# Patient Record
Sex: Male | Born: 1940 | Race: White | Hispanic: No | Marital: Married | Smoking: Former smoker
Health system: Southern US, Community
[De-identification: ages and names within clinical notes are randomized; demographics above are authoritative.]

## PROBLEM LIST (undated history)

## (undated) DIAGNOSIS — I671 Cerebral aneurysm, nonruptured: Secondary | ICD-10-CM

## (undated) DIAGNOSIS — K922 Gastrointestinal hemorrhage, unspecified: Secondary | ICD-10-CM

## (undated) DIAGNOSIS — I10 Essential (primary) hypertension: Secondary | ICD-10-CM

## (undated) DIAGNOSIS — I2510 Atherosclerotic heart disease of native coronary artery without angina pectoris: Secondary | ICD-10-CM

## (undated) DIAGNOSIS — M1990 Unspecified osteoarthritis, unspecified site: Secondary | ICD-10-CM

## (undated) DIAGNOSIS — N189 Chronic kidney disease, unspecified: Secondary | ICD-10-CM

## (undated) DIAGNOSIS — K219 Gastro-esophageal reflux disease without esophagitis: Secondary | ICD-10-CM

## (undated) DIAGNOSIS — Z8679 Personal history of other diseases of the circulatory system: Secondary | ICD-10-CM

## (undated) DIAGNOSIS — K5792 Diverticulitis of intestine, part unspecified, without perforation or abscess without bleeding: Secondary | ICD-10-CM

## (undated) DIAGNOSIS — R413 Other amnesia: Secondary | ICD-10-CM

## (undated) DIAGNOSIS — D62 Acute posthemorrhagic anemia: Secondary | ICD-10-CM

## (undated) DIAGNOSIS — E785 Hyperlipidemia, unspecified: Secondary | ICD-10-CM

## (undated) DIAGNOSIS — Z86010 Personal history of colonic polyps: Secondary | ICD-10-CM

## (undated) DIAGNOSIS — Z87442 Personal history of urinary calculi: Secondary | ICD-10-CM

## (undated) DIAGNOSIS — I219 Acute myocardial infarction, unspecified: Secondary | ICD-10-CM

## (undated) DIAGNOSIS — H269 Unspecified cataract: Secondary | ICD-10-CM

## (undated) HISTORY — PX: INGUINAL HERNIA REPAIR: SUR1180

## (undated) HISTORY — DX: Acute myocardial infarction, unspecified: I21.9

## (undated) HISTORY — DX: Gastrointestinal hemorrhage, unspecified: K92.2

## (undated) HISTORY — DX: Acute posthemorrhagic anemia: D62

## (undated) HISTORY — PX: COLONOSCOPY: SHX174

## (undated) HISTORY — DX: Personal history of other diseases of the circulatory system: Z86.79

## (undated) HISTORY — DX: Hyperlipidemia, unspecified: E78.5

## (undated) HISTORY — DX: Personal history of colonic polyps: Z86.010

## (undated) HISTORY — DX: Diverticulitis of intestine, part unspecified, without perforation or abscess without bleeding: K57.92

## (undated) HISTORY — PX: OTHER SURGICAL HISTORY: SHX169

## (undated) HISTORY — DX: Chronic kidney disease, unspecified: N18.9

## (undated) HISTORY — PX: CATARACT EXTRACTION: SUR2

## (undated) HISTORY — DX: Unspecified osteoarthritis, unspecified site: M19.90

## (undated) HISTORY — DX: Gastro-esophageal reflux disease without esophagitis: K21.9

## (undated) HISTORY — DX: Unspecified cataract: H26.9

## (undated) HISTORY — DX: Essential (primary) hypertension: I10

## (undated) HISTORY — DX: Other amnesia: R41.3

---

## 1963-04-03 HISTORY — PX: UMBILICAL HERNIA REPAIR: SHX196

## 1996-04-02 DIAGNOSIS — Z8679 Personal history of other diseases of the circulatory system: Secondary | ICD-10-CM

## 1996-04-02 HISTORY — PX: ABDOMINAL AORTIC ANEURYSM REPAIR: SUR1152

## 1996-04-02 HISTORY — DX: Personal history of other diseases of the circulatory system: Z86.79

## 1999-04-24 ENCOUNTER — Encounter: Payer: Self-pay | Admitting: Family Medicine

## 1999-04-24 ENCOUNTER — Other Ambulatory Visit: Admission: RE | Admit: 1999-04-24 | Discharge: 1999-04-24 | Payer: Self-pay | Admitting: *Deleted

## 1999-04-24 ENCOUNTER — Encounter: Admission: RE | Admit: 1999-04-24 | Discharge: 1999-04-24 | Payer: Self-pay | Admitting: Family Medicine

## 1999-04-25 ENCOUNTER — Encounter: Payer: Self-pay | Admitting: *Deleted

## 1999-04-25 ENCOUNTER — Encounter: Admission: RE | Admit: 1999-04-25 | Discharge: 1999-04-25 | Payer: Self-pay | Admitting: *Deleted

## 1999-05-04 ENCOUNTER — Ambulatory Visit: Admission: RE | Admit: 1999-05-04 | Discharge: 1999-05-04 | Payer: Self-pay | Admitting: *Deleted

## 1999-05-09 ENCOUNTER — Inpatient Hospital Stay (HOSPITAL_COMMUNITY): Admission: RE | Admit: 1999-05-09 | Discharge: 1999-05-14 | Payer: Self-pay | Admitting: *Deleted

## 1999-05-09 ENCOUNTER — Encounter: Payer: Self-pay | Admitting: *Deleted

## 1999-05-09 ENCOUNTER — Encounter (INDEPENDENT_AMBULATORY_CARE_PROVIDER_SITE_OTHER): Payer: Self-pay | Admitting: *Deleted

## 1999-05-10 ENCOUNTER — Encounter: Payer: Self-pay | Admitting: *Deleted

## 2001-07-10 ENCOUNTER — Emergency Department (HOSPITAL_COMMUNITY): Admission: EM | Admit: 2001-07-10 | Discharge: 2001-07-10 | Payer: Self-pay | Admitting: Emergency Medicine

## 2007-07-28 ENCOUNTER — Encounter: Payer: Self-pay | Admitting: Family Medicine

## 2008-03-03 ENCOUNTER — Emergency Department (HOSPITAL_BASED_OUTPATIENT_CLINIC_OR_DEPARTMENT_OTHER): Admission: EM | Admit: 2008-03-03 | Discharge: 2008-03-03 | Payer: Self-pay | Admitting: Emergency Medicine

## 2008-03-03 ENCOUNTER — Ambulatory Visit: Payer: Self-pay | Admitting: Diagnostic Radiology

## 2008-03-03 IMAGING — CR DG KNEE COMPLETE 4+V*R*
4 series · 4 of 4 positions shown · non-contrast
Comparison: None

CLINICAL DATA: Right leg pain.

RIGHT KNEE - COMPLETE 4+ VIEW

[t knee ap right]
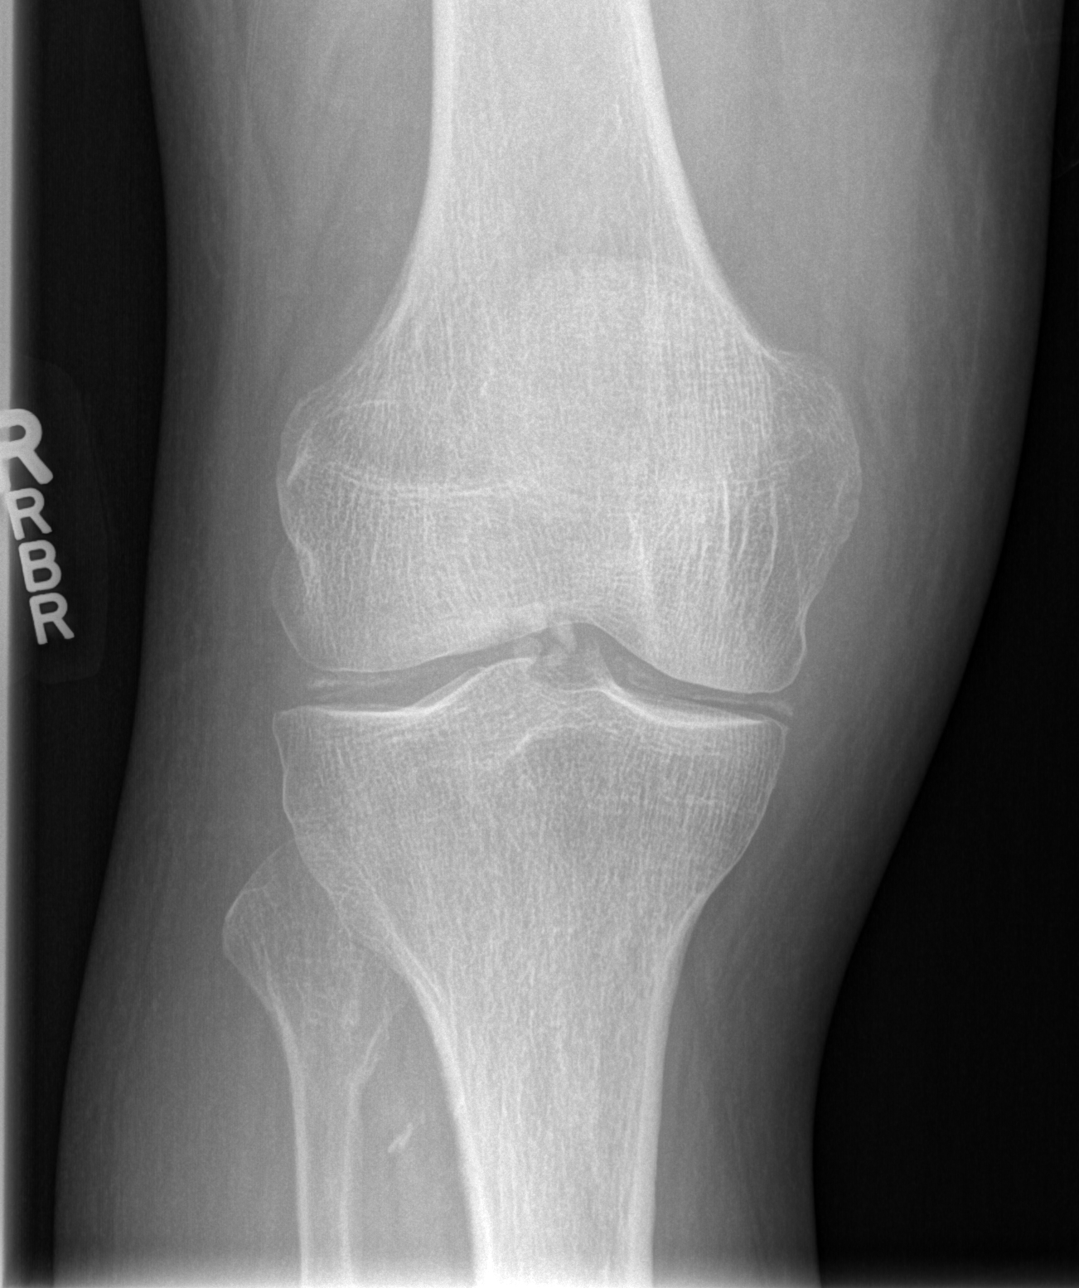

[t knee oblique right (1 of 2)]
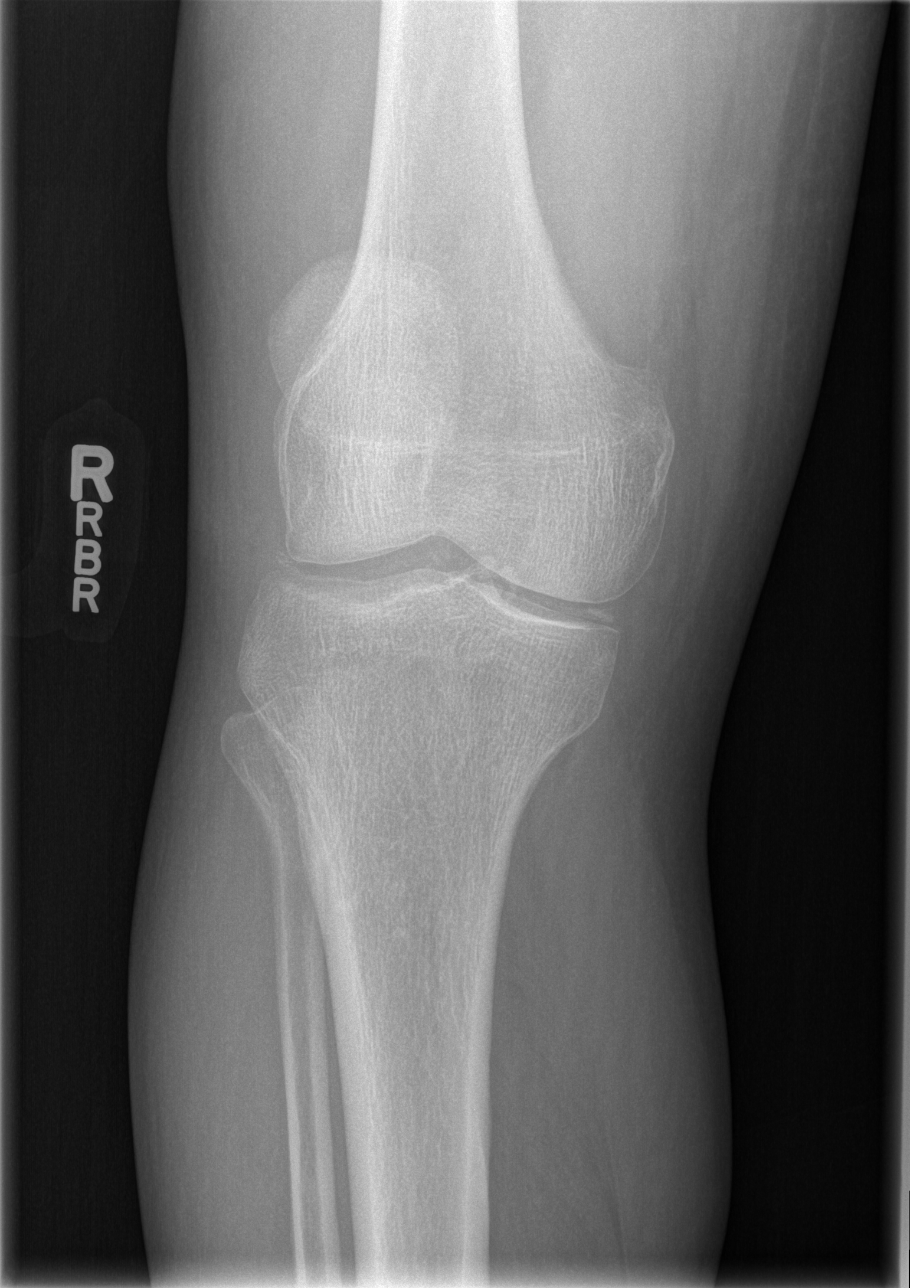

[t knee oblique right (2 of 2)]
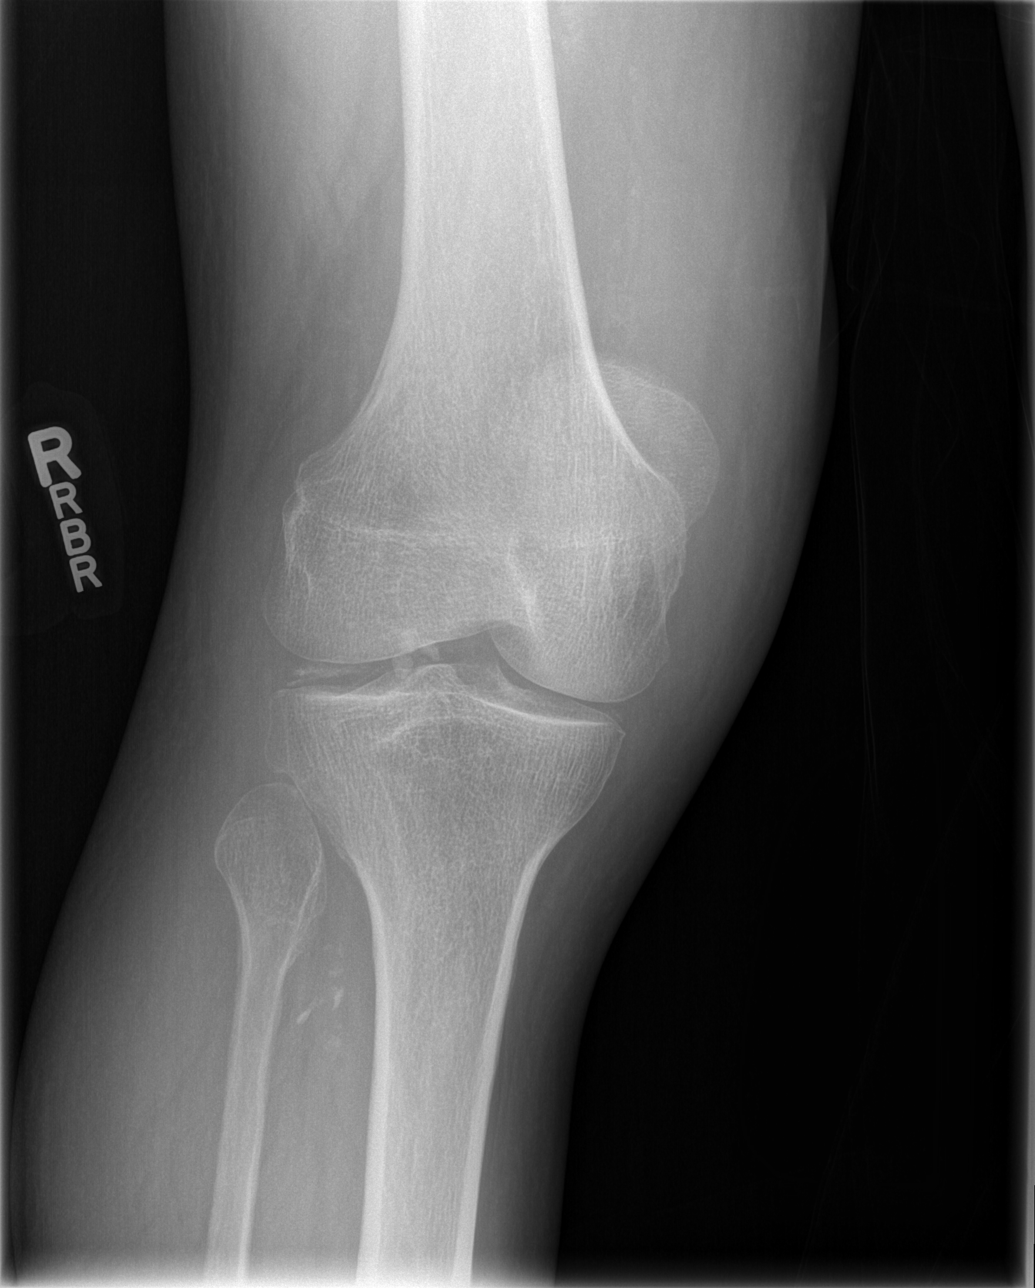

[t knee lat right]
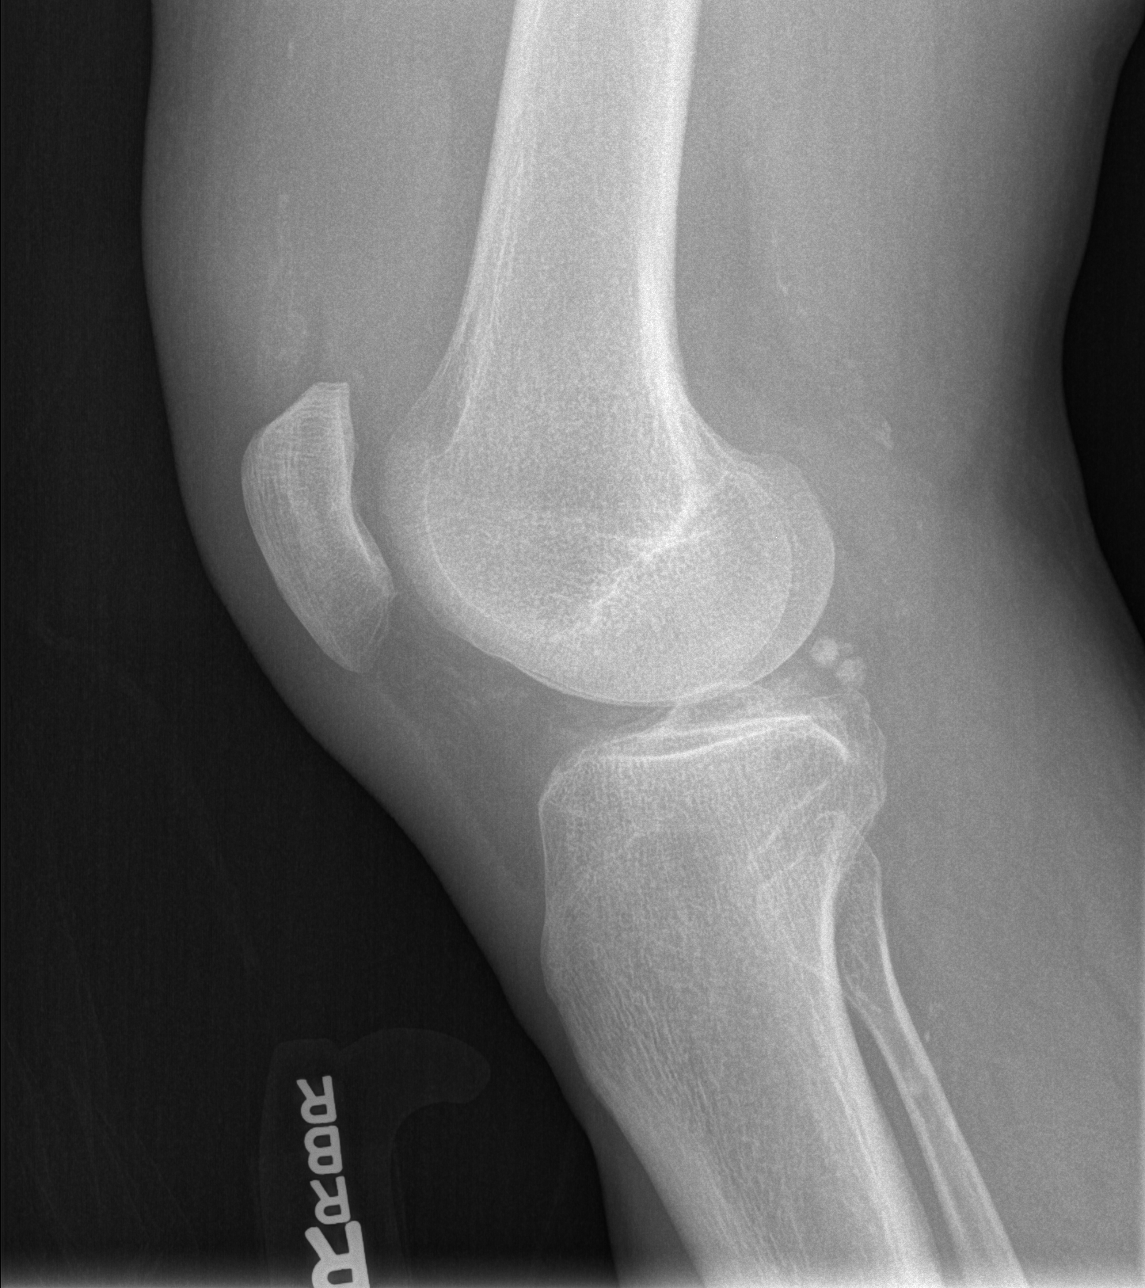

[4 of 4 positions shown; findings below may reference images not displayed]

FINDINGS: There is chondrocalcinosis with early degenerative
changes.  Moderate to large joint effusion present.  No fracture,
subluxation, or dislocation.
IMPRESSION: Chondrocalcinosis with early degenerative changes.

Moderate to large joint effusion.  No acute bony abnormality.

## 2008-10-15 ENCOUNTER — Ambulatory Visit: Payer: Self-pay | Admitting: Family Medicine

## 2008-10-15 DIAGNOSIS — E785 Hyperlipidemia, unspecified: Secondary | ICD-10-CM

## 2008-10-15 DIAGNOSIS — E1121 Type 2 diabetes mellitus with diabetic nephropathy: Secondary | ICD-10-CM

## 2008-10-15 DIAGNOSIS — I10 Essential (primary) hypertension: Secondary | ICD-10-CM

## 2009-04-14 ENCOUNTER — Ambulatory Visit: Payer: Self-pay | Admitting: Family Medicine

## 2009-04-14 LAB — CONVERTED CEMR LAB
ALT: 26 units/L (ref 0–53)
AST: 24 units/L (ref 0–37)
Albumin: 3.9 g/dL (ref 3.5–5.2)
Alkaline Phosphatase: 56 units/L (ref 39–117)
BUN: 18 mg/dL (ref 6–23)
Bilirubin, Direct: 0 mg/dL (ref 0.0–0.3)
CO2: 29 meq/L (ref 19–32)
Calcium: 9.6 mg/dL (ref 8.4–10.5)
Chloride: 97 meq/L (ref 96–112)
Cholesterol: 163 mg/dL (ref 0–200)
Creatinine, Ser: 1 mg/dL (ref 0.4–1.5)
GFR calc non Af Amer: 79.16 mL/min (ref >60)
Glucose, Bld: 147 mg/dL — ABNORMAL HIGH (ref 70–99)
HDL: 47.9 mg/dL (ref >39.00)
Hgb A1c MFr Bld: 6.8 % — ABNORMAL HIGH (ref 4.6–6.5)
LDL Cholesterol: 87 mg/dL (ref 0–99)
Potassium: 4.9 meq/L (ref 3.5–5.1)
Sodium: 134 meq/L — ABNORMAL LOW (ref 135–145)
Total Bilirubin: 1.1 mg/dL (ref 0.3–1.2)
Total CHOL/HDL Ratio: 3
Total Protein: 7.3 g/dL (ref 6.0–8.3)
Triglycerides: 143 mg/dL (ref 0.0–149.0)
VLDL: 28.6 mg/dL (ref 0.0–40.0)

## 2009-08-10 ENCOUNTER — Ambulatory Visit: Payer: Self-pay | Admitting: Family Medicine

## 2009-08-10 DIAGNOSIS — M549 Dorsalgia, unspecified: Secondary | ICD-10-CM | POA: Insufficient documentation

## 2009-11-21 ENCOUNTER — Ambulatory Visit: Payer: Self-pay | Admitting: Family Medicine

## 2009-11-22 LAB — CONVERTED CEMR LAB
Creatinine,U: 148.7 mg/dL
Hgb A1c MFr Bld: 6.9 % — ABNORMAL HIGH (ref 4.6–6.5)
Microalb Creat Ratio: 4.6 mg/g (ref 0.0–30.0)
Microalb, Ur: 6.8 mg/dL — ABNORMAL HIGH (ref 0.0–1.9)

## 2010-05-02 NOTE — Assessment & Plan Note (Signed)
Summary: DIABETES CK // RS   Vital Signs:  Patient profile:   69 year old male Weight:      197 pounds Temp:     98.1 degrees F oral BP sitting:   152 / 88  (left arm) Cuff size:   regular  Vitals Entered By: Sid Falcon LPN (April 14, 2009 9:23 AM) CC: 6 month follow-up, diabetes check, Hypertension Management   History of Present Illness: Followup multiple medical problems.  Type 2 diabetes. Fairly well-controlled on home readings. Fasting is usually 130 to146. No hypoglycemia. Schedule for eye exam in February of this year. No history of neuropathy or retinopathy. Medications reviewed. Patient compliant with Actos and metformin. No side effects from medication.  Hyperlipidemia treated with Vytorin. Patient compliant with therapy. No history of CAD. No myalgias or other side effects.  Hypertension on lisinopril. Questionable history of whitecoat syndrome. Blood pressures usually less a 130 systolic by home readings.  Diabetes Management History:      The patient is a 70 years old male who comes in for evaluation of DM Type 2.  He states understanding of dietary principles but he is not following the appropriate diet.  No sensory loss is reported.  Self foot exams are being performed.  He is checking home blood sugars.  He says that he is not exercising regularly.        Hypoglycemic symptoms are not occurring.  No hyperglycemic symptoms are reported.        There are no symptoms to suggest diabetic complications.  No changes have been made to his treatment plan since last visit.    Hypertension History:      He denies headache, chest pain, palpitations, dyspnea with exertion, orthopnea, PND, peripheral edema, visual symptoms, neurologic problems, syncope, and side effects from treatment.  He notes no problems with any antihypertensive medication side effects.        Positive major cardiovascular risk factors include male age 104 years old or older, diabetes, hyperlipidemia, and  hypertension.  Negative major cardiovascular risk factors include non-tobacco-user status.      Preventive Screening-Counseling & Management  Caffeine-Diet-Exercise     Does Patient Exercise: no  Allergies (verified): No Known Drug Allergies  Past History:  Past Medical History: Last updated: 10/15/2008  Diabetes mellitus, type II Hypertension Kidney Stones High Cholesterol   Past Surgical History: Last updated: 10/15/2008 Hernia 2 times Aneuysm -Aorta 2002   Social History: Last updated: 10/15/2008 Occupation: Never Smoked Alcohol use-no Married  Social History: Does Patient Exercise:  no  Review of Systems      See HPI  Physical Exam  General:  Well-developed,well-nourished,in no acute distress; alert,appropriate and cooperative throughout examination Ears:  External ear exam shows no significant lesions or deformities.  Otoscopic examination reveals clear canals, tympanic membranes are intact bilaterally without bulging, retraction, inflammation or discharge. Hearing is grossly normal bilaterally. Mouth:  Oral mucosa and oropharynx without lesions or exudates.  Teeth in good repair. Neck:  No deformities, masses, or tenderness noted. Lungs:  Normal respiratory effort, chest expands symmetrically. Lungs are clear to auscultation, no crackles or wheezes. Heart:  Normal rate and regular rhythm. S1 and S2 normal without gallop, murmur, click, rub or other extra sounds. Extremities:  No clubbing, cyanosis, edema, or deformity noted with normal full range of motion of all joints.   Neurologic:  normal sensory function to monofilament testing  Diabetes Management Exam:    Foot Exam (with socks and/or shoes not present):  Sensory-Pinprick/Light touch:          Left medial foot (L-4): normal          Left dorsal foot (L-5): normal          Left lateral foot (S-1): normal          Right medial foot (L-4): normal          Right dorsal foot (L-5): normal           Right lateral foot (S-1): normal       Sensory-Monofilament:          Left foot: normal          Right foot: normal       Inspection:          Left foot: normal          Right foot: normal       Nails:          Left foot: normal          Right foot: normal   Impression & Recommendations:  Problem # 1:  HYPERLIPIDEMIA (ICD-272.4)  His updated medication list for this problem includes:    Vytorin 10-40 Mg Tabs (Ezetimibe-simvastatin) ..... Once daily  Orders: Venipuncture (40981) TLB-Lipid Panel (80061-LIPID) TLB-Hepatic/Liver Function Pnl (80076-HEPATIC)  Problem # 2:  HYPERTENSION (ICD-401.9) ? white coat syndrome. Pt to bring home cuff with next visit to compare readings.  good control by home readings. His updated medication list for this problem includes:    Lisinopril 20 Mg Tabs (Lisinopril) ..... Once daily  Orders: Venipuncture (19147) TLB-BMP (Basic Metabolic Panel-BMET) (80048-METABOL)  Problem # 3:  DIABETES MELLITUS, TYPE II (ICD-250.00) recheck A1C and exercise more. His updated medication list for this problem includes:    Actos 30 Mg Tabs (Pioglitazone hcl) ..... Once daily    Lisinopril 20 Mg Tabs (Lisinopril) ..... Once daily    Metformin Hcl 500 Mg Tabs (Metformin hcl) ..... One tab twice daily    Aspirin 81 Mg Tabs (Aspirin) ..... Once daily  Orders: Venipuncture (82956) TLB-A1C / Hgb A1C (Glycohemoglobin) (83036-A1C)  Complete Medication List: 1)  Actos 30 Mg Tabs (Pioglitazone hcl) .... Once daily 2)  Vytorin 10-40 Mg Tabs (Ezetimibe-simvastatin) .... Once daily 3)  Lisinopril 20 Mg Tabs (Lisinopril) .... Once daily 4)  Metformin Hcl 500 Mg Tabs (Metformin hcl) .... One tab twice daily 5)  Aspirin 81 Mg Tabs (Aspirin) .... Once daily 6)  Fish Oil 1000 Mg Caps (Omega-3 fatty acids) .... Twice daily 7)  Centrum Silver Tabs (Multiple vitamins-minerals) .... Once daily 8)  Vitamin E 100 Unit Caps (Vitamin e) .... Once daily 9)  Zinc 50 Mg Tabs  (Zinc) 10)  B Complex Tabs (B complex vitamins)  Hypertension Assessment/Plan:      The patient's hypertensive risk group is category C: Target organ damage and/or diabetes.  Today's blood pressure is 152/88.    Patient Instructions: 1)  It is important that you exercise reguarly at least 20 minutes 5 times a week. If you develop chest pain, have severe difficulty breathing, or feel very tired, stop exercising immediately and seek medical attention.  2)  You need to lose weight. Consider a lower calorie diet and regular exercise.  3)  Check your blood sugars regularly. If your readings are usually above:150  or below 70 you should contact our office.  4)  It is important that your diabetic A1c level is checked every 3 months.  5)  See your  eye doctor yearly to check for diabetic eye damage. 6)  Check your feet each night  for sore areas, calluses or signs of infection.  7)  Check your  Blood Pressure regularly . If it is above: 130/80 you should make an appointment.

## 2010-05-02 NOTE — Assessment & Plan Note (Signed)
Summary: muscle spasms in upper back/cjr   Vital Signs:  Patient profile:   70 year old male Temp:     98.7 degrees F oral BP sitting:   140 / 86  (left arm) Cuff size:   regular  Vitals Entered By: Sid Falcon LPN (Aug 10, 2009 2:47 PM)  History of Present Illness: Acute visit left upper back pain. No injury. 3 days' duration. Sensation of muscle tightness intermittently. Using some type of a liniment with mild improvement. Advil with minimal relief. Worse with lifting. No radiculopathy symptoms.  no shortness of breath or pleuritic pain.  No neck pain.  Allergies (verified): No Known Drug Allergies  Past History:  Past Medical History: Last updated: 10/15/2008  Diabetes mellitus, type II Hypertension Kidney Stones High Cholesterol   Review of Systems  The patient denies anorexia, fever, weight loss, chest pain, syncope, dyspnea on exertion, and hemoptysis.    Physical Exam  General:  Well-developed,well-nourished,in no acute distress; alert,appropriate and cooperative throughout examination Neck:  No deformities, masses, or tenderness noted. Lungs:  Normal respiratory effort, chest expands symmetrically. Lungs are clear to auscultation, no crackles or wheezes. Heart:  normal rate, regular rhythm, and no murmur.   Extremities:  no edema Neurologic:  full strength upper extremities. Symmetric reflexes   Impression & Recommendations:  Problem # 1:  BACK PAIN, UPPER (ICD-724.5) suspect muscular.  Try muscle relaxer and cont Advil. His updated medication list for this problem includes:    Aspirin 81 Mg Tabs (Aspirin) ..... Once daily    Cyclobenzaprine Hcl 5 Mg Tabs (Cyclobenzaprine hcl) ..... One by mouth q 8 hours as needed back pain  Complete Medication List: 1)  Actos 30 Mg Tabs (Pioglitazone hcl) .... Once daily 2)  Vytorin 10-40 Mg Tabs (Ezetimibe-simvastatin) .... Once daily 3)  Lisinopril 20 Mg Tabs (Lisinopril) .... Once daily 4)  Metformin Hcl 500 Mg  Tabs (Metformin hcl) .... One tab twice daily 5)  Aspirin 81 Mg Tabs (Aspirin) .... Once daily 6)  Fish Oil 1000 Mg Caps (Omega-3 fatty acids) .... Twice daily 7)  Centrum Silver Tabs (Multiple vitamins-minerals) .... Once daily 8)  Vitamin E 100 Unit Caps (Vitamin e) .... Once daily 9)  Zinc 50 Mg Tabs (Zinc) 10)  B Complex Tabs (B complex vitamins) 11)  Cyclobenzaprine Hcl 5 Mg Tabs (Cyclobenzaprine hcl) .... One by mouth q 8 hours as needed back pain  Patient Instructions: 1)  continue topical liniment 2)  Continue Advil as needed. 3)  Touch base in 2 weeks if symptoms not resolving Prescriptions: CYCLOBENZAPRINE HCL 5 MG TABS (CYCLOBENZAPRINE HCL) one by mouth q 8 hours as needed back pain  #30 x 0   Entered and Authorized by:   Evelena Peat MD   Signed by:   Evelena Peat MD on 08/10/2009   Method used:   [facility]        [facility]  [address] [phone]ail[facility]       [address] [place], [place]  [address]       Ph: [phone] or [phone]       Fax: [phone]   RxID:   [id number]

## 2010-05-02 NOTE — Assessment & Plan Note (Signed)
Summary: 3 month follow re: a1c/cjr   Vital Signs:  Patient profile:   70 year old male Temp:     97.8 degrees F oral BP sitting:   120 / 78  (left arm) Cuff size:   regular  Vitals Entered By: Sid Falcon LPN (November 21, 2009 10:31 AM)  History of Present Illness: Here for follow up diabetes  Diabetes Management History:      The patient is a 70 years old male who comes in for evaluation of DM Type 2.  He is (or has been) enrolled in the "Diabetic Education Program".  He states understanding of dietary principles and is following his diet appropriately.  No sensory loss is reported.  Self foot exams are being performed.  He is checking home blood sugars.  He says that he is not exercising regularly.        Hypoglycemic symptoms are not occurring.  No hyperglycemic symptoms are reported.        No changes have been made to his treatment plan since last visit.    Allergies: No Known Drug Allergies  Review of Systems  The patient denies weight loss, weight gain, vision loss, chest pain, syncope, dyspnea on exertion, peripheral edema, prolonged cough, headaches, hemoptysis, abdominal pain, and melena.    Physical Exam  General:  Well-developed,well-nourished,in no acute distress; alert,appropriate and cooperative throughout examination Mouth:  Oral mucosa and oropharynx without lesions or exudates.  Teeth in good repair. Neck:  No deformities, masses, or tenderness noted. Lungs:  Normal respiratory effort, chest expands symmetrically. Lungs are clear to auscultation, no crackles or wheezes. Heart:  normal rate and regular rhythm.   Extremities:  no edema.  Diabetes Management Exam:    Foot Exam (with socks and/or shoes not present):       Sensory-Pinprick/Light touch:          Left medial foot (L-4): normal          Left dorsal foot (L-5): normal          Left lateral foot (S-1): normal          Right medial foot (L-4): normal          Right dorsal foot (L-5): normal    Right lateral foot (S-1): normal       Sensory-Monofilament:          Left foot: normal          Right foot: normal       Inspection:          Left foot: normal          Right foot: normal       Nails:          Left foot: normal          Right foot: normal    Eye Exam:       Eye Exam done here today          Results: normal   Impression & Recommendations:  Problem # 1:  DIABETES MELLITUS, TYPE II (ICD-250.00)  His updated medication list for this problem includes:    Actos 30 Mg Tabs (Pioglitazone hcl) ..... Once daily    Lisinopril 20 Mg Tabs (Lisinopril) ..... Once daily    Metformin Hcl 500 Mg Tabs (Metformin hcl) ..... One tab twice daily    Aspirin 81 Mg Tabs (Aspirin) ..... Once daily  Orders: Venipuncture (16109) Specimen Handling (60454) TLB-A1C / Hgb A1C (Glycohemoglobin) (83036-A1C) TLB-Microalbumin/Creat Ratio, Urine (  82043-MALB)  Complete Medication List: 1)  Actos 30 Mg Tabs (Pioglitazone hcl) .... Once daily 2)  Vytorin 10-40 Mg Tabs (Ezetimibe-simvastatin) .... Once daily 3)  Lisinopril 20 Mg Tabs (Lisinopril) .... Once daily 4)  Metformin Hcl 500 Mg Tabs (Metformin hcl) .... One tab twice daily 5)  Aspirin 81 Mg Tabs (Aspirin) .... Once daily 6)  Fish Oil 1000 Mg Caps (Omega-3 fatty acids) .... Twice daily 7)  Centrum Silver Tabs (Multiple vitamins-minerals) .... Once daily 8)  Vitamin E 100 Unit Caps (Vitamin e) .... Once daily 9)  Zinc 50 Mg Tabs (Zinc) 10)  B Complex Tabs (B complex vitamins) 11)  Cyclobenzaprine Hcl 5 Mg Tabs (Cyclobenzaprine hcl) .... One by mouth q 8 hours as needed back pain  Diabetes Management Assessment/Plan:      His blood pressure goal is < 130/80.    Patient Instructions: 1)  Please schedule a follow-up appointment in 6 months .  2)  Check your blood sugars regularly. If your readings are usually above:  or below 70 you should contact our office.  3)  It is important that your diabetic A1c level is checked every 3  months.  4)  See your eye doctor yearly to check for diabetic eye damage. 5)  Check your feet each night  for sore areas, calluses or signs of infection.  Prescriptions: METFORMIN HCL 500 MG TABS (METFORMIN HCL) one tab twice daily  #180 x 3   Entered and Authorized by:   Evelena Peat MD   Signed by:   Evelena Peat MD on 11/21/2009   Method used:   [facility]        [facility]  [address] [phone][facility] ([facility]ail[facility]       [address]       [place], [place]  [address]       Ph: [phone] or [phone]       Fax: [phone]   RxID:   [id number]

## 2010-05-10 ENCOUNTER — Other Ambulatory Visit: Payer: Self-pay | Admitting: Family Medicine

## 2010-05-10 DIAGNOSIS — I10 Essential (primary) hypertension: Secondary | ICD-10-CM

## 2010-05-12 ENCOUNTER — Encounter: Payer: Self-pay | Admitting: Family Medicine

## 2010-05-30 ENCOUNTER — Telehealth: Payer: Self-pay | Admitting: Family Medicine

## 2010-05-30 NOTE — Telephone Encounter (Signed)
Duplicate request

## 2010-05-30 NOTE — Telephone Encounter (Signed)
OK to refill

## 2010-05-30 NOTE — Telephone Encounter (Signed)
Going on a cruise and would like to have an anti-nausea / motion sickness patch called into [facility]..... # [phone] °

## 2010-05-31 ENCOUNTER — Telehealth: Payer: Self-pay | Admitting: Family Medicine

## 2010-05-31 DIAGNOSIS — T753XXA Motion sickness, initial encounter: Secondary | ICD-10-CM

## 2010-05-31 MED ORDER — SCOPOLAMINE 1 MG/3DAYS TD PT72: 1.0000 | MEDICATED_PATCH | TRANSDERMAL | Status: DC

## 2010-05-31 NOTE — Telephone Encounter (Signed)
Rx sent, OKed yesterday by Dr Caryl Never

## 2010-05-31 NOTE — Telephone Encounter (Signed)
Triage vm-----going on a cruise and would like his rx for the sea sickness patches, [facility]. Wife and son's patches have been called in.

## 2010-08-18 NOTE — H&P (Signed)
[facility] [place]  Patient:    Miguel Vazquez, Miguel Vazquez                          MRN: Adm. Date:  05/09/99 Attending:  Denman George, M.D. Dictator:   Marlowe Kays, P.A. CC:         Teena Irani. Arlyce Dice, M.D.                         History and Physical  CVTS:  #[id number]                DATE OF BIRTH:  [birth year]  CHIEF COMPLAINT:  Abdominal aortic aneurysm.  HISTORY OF PRESENT ILLNESS:  [name] is a pleasant 70 year old white male, referred by Dr. Teena Irani. Arlyce Dice for evaluation of an abdominal aortic aneurysm.  The patient had been in fairly good health, with the exception of diabetes mellitus, as well as hypertension; however, on routine examination a palpable pulsatile mass was detected on April 25, 1999.  The CT of the abdomen revealed a large 7.0 cm x 6.6 cm infrarenal aneurysm, with the central patent lumen of about 4.8 cm x 4.1 cm wide.  A CT of the abdomen also revealed a __________ vascular calcification of the abdominal aorta and its iliac branches was seen.  On May 04, 1999, the patient had an arteriogram which confirmed the diagnosis.  He now returns prior to surgery with no significant complaints.  He denies any abdominal or back pain.  No melena or hematochezia.  No nausea, vomiting, diaphoresis, or  diarrhea.  No chest pain or arrhythmias.  The patient underwent a cardiac clearance, which showed no ischemia, and normal LV function.  The physical examination reveals a palpable pulsatile mass of about 7.0 cm, without bruits, nontender.  The rest of his physical examination is unremarkable.  PAST MEDICAL HISTORY: 1. Abdominal aortic aneurysm 6.6 cm x 7.0 cm wide. 2. AODM, diet-controlled. 3. Hypertension. 4. Right inguinal hernia. 5. Hypercholesterolemia. 6. Mild sigmoid diverticulosis. 7. History of arthritis. 8. Ejection fraction of 58%.  PAST SURGICAL HISTORY: 1. Status post arteriogram on May 04, 1999, Dr. Denman George. 2. Status post hernia repair in 1970, and 1997.  CURRENT MEDICATIONS: 1. Lipitor 20 mg one p.o. q.d. 2. Zestril 20 mg one p.o. q.d. 3. ASA 325 mg one p.o. q.d. 4. Multivitamins one p.o. q.d. 5. Vitamin E one p.o. q.d. 6. Guigarlic pill one p.o. q.d.  ALLERGIES:  No known drug allergies.  REVIEW OF SYSTEMS:  The patient denies any kidney disease, asthma, chronic obstructive pulmonary disease, TIA, CVA, amaurosis fugax, syncope, presyncope. No CAD, angina, or arrhythmia.  No myocardial infarction.  No PE, deep vein thrombosis, GI bleed, dysuria, or hematuria.  No GERD symptoms.  The rest of the review of systems is negative.  Please see the HIP and the past medical history for significant positives.  FAMILY HISTORY:  Significant the mother deceased at age 48 with multiple myeloma. Father died at age 44 of a stroke.  One brother is alive with a history of alcohol and tobacco abuse, peripheral vascular occlusive disease, age 2.  SOCIAL HISTORY:  The patient is married.  He has one child.  He is an Radio broadcast assistant.  He denies any tobacco intake.  He quit smoking 14 years ago, one pack per day for 25 years of use.  He drinks 1/2 glass of wine  every evening.  PHYSICAL EXAMINATION:  GENERAL:  A well-developed, well-nourished 70 year old white male, in no acute distress, alert and oriented x 3.  VITAL SIGNS:  Blood pressure 140/80 on the left, 144/84 on the right, pulse 76,  respirations 18.  HEENT:  Head normocephalic, atraumatic.  PERRLA.  EOMI.  No AV nicking, papilledema, hemorrhages, macular degeneration, or arcus senilis.  NECK:  Supple, no jugular venous distention, bruits, thyromegaly, or lymphadenopathy.  CHEST:  Symmetrical on inspiration.  No wheezes, rhonchi, or rales. Respirations are unlabored.  NODES:  No axillary or supraclavicular lymphadenopathy.  CARDIOVASCULAR:  A regular rate and rhythm.  A 1/6 systolic murmur, at the right sternal  border, without radiation.  Normal S1, S2.  No rubs or gallops.  ABDOMEN:  Soft, nontender.  Bowel sounds x 4.  No hepatosplenomegaly.  There is a palpable pulsatile mass, consistent with an abdominal aortic aneurysm, but no abdominal bruits are heard.  GENITOURINARY:  Deferred.  RECTAL:  Deferred.  EXTREMITIES:  No cyanosis, clubbing, or edema.  SKIN:  No ulceration, normal hair pattern, and warm temperature.  PERIPHERAL PULSES:  Carotids 2+ bilaterally without bruits, femorals 2+ bilaterally without bruits, popliteals and dorsalis pedis and posterior tibialis 2+ bilaterally.  NEUROLOGIC:  Grossly normal.  Normal gait.  Deep tendon reflexes 2+ bilaterally. Muscle strength 5/5.  ASSESSMENT/PLAN:  A 70 year old white male with a found abdominal aortic aneurysm on routine examination, of about 7.0 cm in diameter, who will undergo a abdominal aortic aneurysm repair and graft by Dr. Denman George on May 09, 1999.  Dr. Madilyn Fireman has seen and evaluated this patient prior to this admission, and has explained the risks and benefits involving the procedure and the patient has agreed to continue. DD:  05/09/99 TD:  05/09/99 Job: [id number]

## 2010-08-18 NOTE — Discharge Summary (Signed)
. [facility]  Patient:    Miguel Vazquez, Miguel Vazquez                        MRN: [id number] Adm. Date:  [id number] Disc. Date: [id number] Attending:  Melvenia Needles Dictator:   Luretha Rued Ezzard Standing, P.A.-C. CC:         Barbette Hair. Arlyce Dice, M.D. [facility]             P. Bud Face, M.D.             CVTS office                           Discharge Summary  DATE OF BIRTH:  Jan 14, 1941  DATE OF SURGERY:  May 09, 1999  DISCHARGE DIAGNOSES: 1. Abdominal aortic aneurysm now status post repair and grafting of abdominal    aortic aneurysm with an 18 x 9 aortobifemoral bypass graft. 2. Adult-onset diabetes mellitus. 3. Hypertension. 4. Right inguinal hernia. 5. Hypercholesterolemia. 6. Mild sigmoid diverticulosis. 7. History of arthritis. 9. Ejection fraction 58%.  HISTORY OF PRESENT ILLNESS:  Miguel Vazquez is a pleasant 70 year old white male patient referred by Dr. Arlyce Dice for evaluation of abdominal aortic aneurysm.  He is now being admitted for continued evaluation.  A CT of the abdomen revealed a large 7 x 6 mm infrarenal aneurysm.  He is now admitted for elective repair and grafting of abdominal aortic aneurysm.  HOSPITAL COURSE:  Miguel Vazquez was admitted to the hospital on May 09, 1999, and underwent an abdominal aortic aneurysm repair and grafting with a 18 x 9 vascular graft with aortobifemoral bypass grafting.  He tolerated this without difficulty and was transferred to the SICU.  On postoperative day #1, he was hemodynamically stable with good distal pulses.  He continued to progress over the next five days and was discharged home on the fifth day without further complications.  His discharge hemoglobin was 10.4 and his hematocrit was 28.7, with a potassium of .6 and a BUN of 12 and a creatinine of 0.9.  DISCHARGE MEDICATIONS: 1. Lipitor 20 mg p.o. q.d. 2. Zestril 20 mg p.o. q.d. 3. Enteric-coated aspirin 325 mg p.o. q.d. 4. Multivitamin one  p.o. q.d. 5. Vitamin E 400 IU p.o. q.d. 6. Garlic p.o. q.d. 7. Tylox one to two tablets p.o. every 4 to 6 hours p.r.n. pain.  DISCHARGE INSTRUCTIONS:  No heavy lifting, no strenuous activity, no driving a ar. He may shower as desired, and he is to keep his wounds clean and dry.  He is to  maintain a diabetic diet as prescribed. DD:  06/29/99 TD:  06/29/99 Job: [id number]

## 2010-08-18 NOTE — Op Note (Signed)
[facility]  Patient:    Miguel Vazquez, Miguel Vazquez                       MRN: [id number] Proc. Date: 05/09/99 Adm. Date:  05/09/99 Attending:  Denman George, M.D. CC:         Denman George, M.D.             Teena Irani. Arlyce Dice, M.D.             Peter M. Swaziland, M.D.                           Operative Report  SURGEON:  Denman George, M.D.  ASSISTANT:  Loura Pardon, P.A.  ANESTHETIC:  General endotracheal.  ANESTHESIOLOGIST:  Bedelia Person, M.D.  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  PROCEDURE:  Repair of infrarenal abdominal aortic aneurysm with aortobi-iliac graft.  CLINICAL NOTE:  This is a 70 year old male who was referred for evaluation after the discovery of his origin for renal abdominal aortic aneurysm.  He underwent investigation including preoperative cardiology evaluation by Dr. Swaziland.  CT and arteriogram were performed.  Due to the patients age and relative good health it was felt he should best be treated by open conventional operative repair.  OPERATIVE PROCEDURE:  The patient was brought to the operating room in stable condition.  Placed in a supine position.  General endotracheal anesthesia induced. Foley catheter.  Theone Murdoch catheter inserted.  The abdomen and both legs were prepped and draped in a sterile fashion.  A longitudinal skin incision was made from xiphoid to pubis.  Incision extended  easily through the subcutaneous tissue.  The fascia incised in the midline. The peritoneal cavity entered.  Full laparotomy evaluation carried out.  The stomach and duodenum were unremarkable.  Liver, gallbladder, pancreas and bile ducts normal.  Small bowel  normal.  Large bowel revealed no masses.  In the retroperitoneum was a large infrarenal abdominal aortic aneurysm consistent with 6-7 cm in size.  Small bowel was  retracted to the right.  The transverse colon brought superiorly. The retroperitoneum incised over the neck of the aneurysm.  Dissection carried own to the neck.  The left renal vein was identified and skeletonized.  The juxtarenal artery was cleared.  The inframesenteric vein was retracted superiorly and preserved.  The neck of the aneurysm was adequately exposed for clamp placement.  Dissection then carried distally along the aneurysm sac to the aortic bifurcation. The retroperitoneum incised along the right common iliac artery down to the right common iliac bifurcation.  The right ureter reflected inferiorly.  The internal  maxillary and iliac arteries encircled with vessel loops.  The common iliac, the right common iliac artery encircled with an umbilical tape proximally.  The peritoneal reflection incised along the sigmoid mesocolon.  The retroperitoneum incised lateral to the sigmoid mesentery.  The left ureter reflected inferiorly. The left common iliac artery cleared and encircled with a vessel loop.  The patient administered 5000 units of heparin intravenously and 25 grams of mannitol intravenously.  The infrarenal aorta controlled with an aortic DeBakey clamp.  The right common  iliac artery ligated with umbilical tape.  The left common iliac artery controlled with a Henley clamp.  The infrarenal aorta opened longitudinally.  Back-bleeding lumbar vessels controlled with figure-of-eight 2-0 silk suture.  Laminated thrombus removed. he aneurysm opened up to the neck where it was Td and an 18 x 9 bifurcated Dacron graft was chosen.  The body of the graft anastomoses and the neck of the aneurysm with a running 3-0 Prolene suture.  The anterior anastomoses reinforced with a elt strip.  Proximal anastomoses completed and the graft flushed and each limb of the graft controlled with a Fogarty clamp.  The left limb of the graft tunneled behind the sigmoid mesocolon.   The left common iliac artery divided transversely.  The left limb of the graft divided in the ______ band to the left common iliac artery using a running 6-0 Prolene suture.  At completion of this the left iliac vessels were flushed.  The left limb of the graft flushed and the left leg reperfused.  The right limb of the graft was then brought down to the right common iliac bifurcation.  The right internal ______ arteries controlled with clamps.  A longitudinal arteriotomy made over the proximal right external iliac artery. The right limb of the graft beveled and anastomosed end-to-side to the right external iliac artery using a running 5-0 Prolene suture.  Clamps were then removed after flushing.  The right leg was reperfused.  Adequate hemostasis obtained.  The patient administered 50 mg of protamine intravenously.  Sponge and instrument counts were correct.  The aneurysm sac was then closed over the graft with running 2-0 Vicryl suture.  Retroperitoneum closed with running 3-0 Vicryl suture.  The abdomen examined to ensure there were no retained instruments or sponges. he midline fascia closed with running #1 Prolene suture.  Staples applied to skin.  Sterile dressings applied.  The patient was extubated in the operating room.  Transferred directly to surgical intensive care unit in stable hemodynamic condition. DD:  05/09/99 TD:  05/09/99 Job: [id number]

## 2010-08-24 ENCOUNTER — Encounter: Payer: Self-pay | Admitting: Family Medicine

## 2010-08-24 ENCOUNTER — Ambulatory Visit (INDEPENDENT_AMBULATORY_CARE_PROVIDER_SITE_OTHER): Payer: Medicare Other | Admitting: Family Medicine

## 2010-08-24 VITALS — BP 160/0 | Temp 98.2°F | Ht 66.5 in | Wt 198.0 lb

## 2010-08-24 DIAGNOSIS — M545 Low back pain: Secondary | ICD-10-CM

## 2010-08-24 MED ORDER — PREDNISONE 10 MG PO TABS: ORAL_TABLET | ORAL | Status: AC

## 2010-08-24 NOTE — Patient Instructions (Signed)
Follow up immediately for any weakness, numbness in foot or leg, or any worsening pain. Monitor blood pressure by home cuff.

## 2010-08-24 NOTE — Progress Notes (Signed)
  Subjective:    Patient ID: Miguel Vazquez, male    DOB: [birth year], 70 y.o.   MRN: [id number]  HPI Chief complaint low back pain. Left lower lumbar region. Duration is 2 weeks. Similar occurrences in past usually improved with anti-inflammatories. Pain is moderate. Radiates down posterior lateral left lower extremity occasionally to foot and ankle. No injury. Pain is sharp. Actually improved with walking. Worse with sitting. Some relief with heat and Advil. Flexeril without improvement.  History hypertension treated lisinopril 20 mg daily. Compliant with therapy. No recent headaches, dizziness, dyspnea, or palpitations.  Hx type 2 diabetes.  Not treated with meds.  No symptoms of hyperglycemia.  No weight loss.     Review of Systems  Constitutional: Negative for fever, chills and fatigue.  Eyes: Negative for visual disturbance.  Respiratory: Negative for cough and shortness of breath.   Cardiovascular: Negative for chest pain, palpitations and leg swelling.  Genitourinary: Negative for dysuria and flank pain.  Musculoskeletal: Positive for back pain. Negative for myalgias, joint swelling and gait problem.  Skin: Negative for rash.  Neurological: Negative for weakness and numbness.  Hematological: Negative for adenopathy. Does not bruise/bleed easily.       Objective:   Physical Exam  Constitutional: He is oriented to person, place, and time. He appears well-developed and well-nourished.  HENT:  Mouth/Throat: Oropharynx is clear and moist.  Cardiovascular: Normal rate, regular rhythm and normal heart sounds.   Pulmonary/Chest: Effort normal and breath sounds normal. No respiratory distress. He has no wheezes.  Musculoskeletal: He exhibits no edema.       Straight leg raise is negative. No edema left lower extremity. Good range of motion with flexion extension of back.  Neurological: He is alert and oriented to person, place, and time.       DTRs reflexes 2+ knee and ankle  bilaterally. Full-strength with plantar flexion dorsiflexion bilaterally. Normal sensation left lower extremity  Psychiatric: He has a normal mood and affect.          Assessment & Plan:  #1 low back pain with left radiculopathy symptoms. Nonfocal neuro exam. Trial prednisone taper. Continue heat. Extension stretches recommended. Touch base and 2 weeks if no better #2 hypertension. Elevated today possibly related to pain. Monitor closely next couple weeks. Titrate medication if not improving over the next couple weeks #3 type 2 diabetes. Monitor blood sugars closely on prednisone. Reassess one month and A1c then.

## 2010-09-26 ENCOUNTER — Encounter: Payer: Self-pay | Admitting: Family Medicine

## 2010-09-26 ENCOUNTER — Ambulatory Visit (INDEPENDENT_AMBULATORY_CARE_PROVIDER_SITE_OTHER): Payer: Medicare Other | Admitting: Family Medicine

## 2010-09-26 DIAGNOSIS — E119 Type 2 diabetes mellitus without complications: Secondary | ICD-10-CM

## 2010-09-26 DIAGNOSIS — E785 Hyperlipidemia, unspecified: Secondary | ICD-10-CM

## 2010-09-26 DIAGNOSIS — I10 Essential (primary) hypertension: Secondary | ICD-10-CM

## 2010-09-26 LAB — LIPID PANEL
Cholesterol: 182 mg/dL (ref 0–200)
LDL Cholesterol: 97 mg/dL (ref 0–99)
Total CHOL/HDL Ratio: 3
Triglycerides: 166 mg/dL — ABNORMAL HIGH (ref 0.0–149.0)
VLDL: 33.2 mg/dL (ref 0.0–40.0)

## 2010-09-26 LAB — HEPATIC FUNCTION PANEL
Albumin: 4.3 g/dL (ref 3.5–5.2)
Bilirubin, Direct: 0.2 mg/dL (ref 0.0–0.3)
Total Protein: 7.2 g/dL (ref 6.0–8.3)

## 2010-09-26 NOTE — Progress Notes (Signed)
  Subjective:    Patient ID: Miguel Vazquez, male    DOB: 11-28-1940, 70 y.o.   MRN: [id number]  HPI Pt for medical followup. Lifestyle changes with diet and exercise since last visit- 11 pound weight loss. Feels much better overall. Blood pressure elevated last visit. Improved with weight loss. Patient has type 2 diabetes. Has some concerns about Actos with possible link with bladder cancer. Blood sugars well controlled. Does not monitor frequently. No hypoglycemia. No symptoms of hyperglycemia.  Exercising with walking most days a week. No chest pains or dizziness. No dyspnea. Medications reviewed and compliant with all. No side effects.   Review of Systems  Constitutional: Negative for fever, activity change, appetite change and fatigue.  HENT: Negative for ear pain, congestion and trouble swallowing.   Eyes: Negative for pain and visual disturbance.  Respiratory: Negative for cough, shortness of breath and wheezing.   Cardiovascular: Negative for chest pain and palpitations.  Gastrointestinal: Negative for nausea, vomiting, abdominal pain, diarrhea, constipation, blood in stool, abdominal distention and rectal pain.  Genitourinary: Negative for dysuria, hematuria and testicular pain.  Musculoskeletal: Negative for joint swelling and arthralgias.  Skin: Negative for rash.  Neurological: Negative for dizziness, syncope and headaches.  Hematological: Negative for adenopathy.  Psychiatric/Behavioral: Negative for confusion and dysphoric mood.       Objective:   Physical Exam  Constitutional: He is oriented to person, place, and time. He appears well-developed and well-nourished. No distress.  HENT:  Head: Normocephalic and atraumatic.  Mouth/Throat: Oropharynx is clear and moist.  Neck: Neck supple. No thyromegaly present.  Cardiovascular: Normal rate, regular rhythm and normal heart sounds.   Pulmonary/Chest: Effort normal and breath sounds normal.  Musculoskeletal: He exhibits no  edema.  Lymphadenopathy:    He has no cervical adenopathy.  Neurological: He is alert and oriented to person, place, and time. No cranial nerve deficit.  Psychiatric: He has a normal mood and affect. His behavior is normal.          Assessment & Plan:  #1 hypertension improved with weight loss and near goal. Continue weight loss efforts #2 type 2 diabetes. Reassess A1c. Recent eye exam normal. Consider discontinue Actos with recent weight loss and if A1c well controlled #3 hyperlipidemia. Recheck lipids and hepatic panel

## 2010-09-28 NOTE — Progress Notes (Signed)
Quick Note:  Pt informed on home VM, metformin change of sig done ______

## 2010-10-26 ENCOUNTER — Other Ambulatory Visit: Payer: Self-pay | Admitting: *Deleted

## 2010-10-26 MED ORDER — METFORMIN HCL 500 MG PO TABS: 500.0000 mg | ORAL_TABLET | Freq: Two times a day (BID) | ORAL | Status: DC

## 2010-10-31 ENCOUNTER — Telehealth: Payer: Self-pay | Admitting: Family Medicine

## 2010-10-31 NOTE — Telephone Encounter (Signed)
Need clarification of dosage instructions for Metformin 500mg . Pls call.

## 2010-11-01 MED ORDER — METFORMIN HCL 500 MG PO TABS: ORAL_TABLET | ORAL | Status: DC

## 2010-11-01 NOTE — Telephone Encounter (Signed)
Corrected Metformin to read 2 tabs twice daily, resent to pharmacy

## 2011-01-15 ENCOUNTER — Encounter: Payer: Self-pay | Admitting: Family Medicine

## 2011-01-15 ENCOUNTER — Ambulatory Visit (INDEPENDENT_AMBULATORY_CARE_PROVIDER_SITE_OTHER): Payer: Medicare Other | Admitting: Family Medicine

## 2011-01-15 VITALS — BP 150/98 | Temp 98.3°F | Wt 187.0 lb

## 2011-01-15 DIAGNOSIS — Z23 Encounter for immunization: Secondary | ICD-10-CM

## 2011-01-15 DIAGNOSIS — I10 Essential (primary) hypertension: Secondary | ICD-10-CM

## 2011-01-15 DIAGNOSIS — E785 Hyperlipidemia, unspecified: Secondary | ICD-10-CM

## 2011-01-15 DIAGNOSIS — E119 Type 2 diabetes mellitus without complications: Secondary | ICD-10-CM

## 2011-01-15 LAB — HEMOGLOBIN A1C: Hgb A1c MFr Bld: 6 % (ref 4.6–6.5)

## 2011-01-15 MED ORDER — LISINOPRIL-HYDROCHLOROTHIAZIDE 20-12.5 MG PO TABS: 1.0000 | ORAL_TABLET | Freq: Every day | ORAL | Status: DC

## 2011-01-15 NOTE — Progress Notes (Signed)
  Subjective:    Patient ID: Miguel Vazquez, male    DOB: Jan 14, 1941, 70 y.o.   MRN: [id number]  HPI Medical followup. Medical problems reviewed. Type 2 diabetes. Recent A1c 7.4%. No consistent exercise. Discontinued Actos. Currently taking metformin one in the morning and 2 at night. No symptoms of hyperglycemia.  Hyperlipidemia treated with Vytorin. No myalgias. Lipids last visit slightly elevated triglycerides. LDL at goal. No history of CAD or peripheral vascular disease. No recent chest pain.  Hypertension treated with lisinopril 20 mg daily. No headaches or dizziness. By home blood pressures mostly 135-140 systolic.      Review of Systems  Constitutional: Negative for activity change, fatigue and unexpected weight change.  Respiratory: Negative for cough and shortness of breath.   Cardiovascular: Negative for chest pain, palpitations and leg swelling.  Gastrointestinal: Negative for abdominal pain.  Genitourinary: Negative for dysuria.  Musculoskeletal: Positive for back pain.  Skin: Negative for rash.  Neurological: Negative for dizziness, syncope and headaches.  Hematological: Negative for adenopathy.  Psychiatric/Behavioral: Negative for decreased concentration.       Objective:   Physical Exam  Constitutional: He is oriented to person, place, and time. He appears well-developed and well-nourished. No distress.  Neck: Neck supple. No thyromegaly present.  Cardiovascular: Normal rate and regular rhythm.   Pulmonary/Chest: Effort normal and breath sounds normal. No respiratory distress. He has no wheezes. He has no rales.  Musculoskeletal: He exhibits no edema.  Lymphadenopathy:    He has no cervical adenopathy.  Neurological: He is alert and oriented to person, place, and time. No cranial nerve deficit.          Assessment & Plan:  #1 type 2 diabetes. History of recent suboptimal control. Reassess A1c. If still of further titrate metformin #2 hypertension. Poorly  controlled. Change lisinopril to lisinopril HCTZ 20/12.5 one daily. Recheck blood pressure 6 weeks #3 hyperlipidemia. Recent lipids at goal. Continue Vytorin.  #4 health maintenance. Flu vaccine given.

## 2011-01-15 NOTE — Patient Instructions (Signed)
Lisinopril hctz takes the place of lisinopril.

## 2011-01-16 ENCOUNTER — Encounter: Payer: Self-pay | Admitting: Family Medicine

## 2011-01-16 NOTE — Progress Notes (Signed)
Quick Note:  Pt informed on home VM ______ 

## 2011-02-26 ENCOUNTER — Encounter: Payer: Self-pay | Admitting: Family Medicine

## 2011-02-26 ENCOUNTER — Ambulatory Visit (INDEPENDENT_AMBULATORY_CARE_PROVIDER_SITE_OTHER): Payer: Medicare Other | Admitting: Family Medicine

## 2011-02-26 DIAGNOSIS — I10 Essential (primary) hypertension: Secondary | ICD-10-CM

## 2011-02-26 NOTE — Progress Notes (Signed)
  Subjective:    Patient ID: Miguel Vazquez, male    DOB: 1940/06/28, 70 y.o.   MRN: [id number]  HPI  Follow up hypertension.  Recent addition of HCTZ to his regimen.  Tolerating well.  BP improved with around 130/80 by home reading.  No dizziness or side effects.  Has lost some weight during the past year.   Review of Systems  Constitutional: Negative for appetite change and fatigue.  Eyes: Negative for visual disturbance.  Respiratory: Negative for cough, shortness of breath and wheezing.   Cardiovascular: Negative for chest pain and palpitations.  Genitourinary: Negative for dysuria.  Skin: Negative for rash.  Neurological: Negative for dizziness, syncope and headaches.       Objective:   Physical Exam  Constitutional: He is oriented to person, place, and time. He appears well-developed and well-nourished. No distress.  Neck: Neck supple.  Cardiovascular: Normal rate and regular rhythm.   No murmur heard. Pulmonary/Chest: Effort normal and breath sounds normal. No respiratory distress. He has no wheezes. He has no rales.  Musculoskeletal: He exhibits no edema.  Lymphadenopathy:    He has no cervical adenopathy.  Neurological: He is alert and oriented to person, place, and time.          Assessment & Plan:  Hypertension improved. Continue lisinopril HCTZ. Continue weight control. Routine followup 6 months

## 2011-03-16 ENCOUNTER — Other Ambulatory Visit: Payer: Self-pay | Admitting: Family Medicine

## 2011-05-07 LAB — HM DIABETES EYE EXAM

## 2011-05-18 ENCOUNTER — Encounter: Payer: Self-pay | Admitting: Family Medicine

## 2011-07-22 ENCOUNTER — Other Ambulatory Visit: Payer: Self-pay | Admitting: Family Medicine

## 2011-08-01 ENCOUNTER — Other Ambulatory Visit: Payer: Self-pay | Admitting: Family Medicine

## 2011-08-20 ENCOUNTER — Ambulatory Visit (INDEPENDENT_AMBULATORY_CARE_PROVIDER_SITE_OTHER): Payer: Medicare Other | Admitting: Family Medicine

## 2011-08-20 ENCOUNTER — Encounter: Payer: Self-pay | Admitting: Family Medicine

## 2011-08-20 VITALS — BP 148/78 | Temp 97.9°F | Wt 189.0 lb

## 2011-08-20 DIAGNOSIS — I10 Essential (primary) hypertension: Secondary | ICD-10-CM

## 2011-08-20 DIAGNOSIS — E119 Type 2 diabetes mellitus without complications: Secondary | ICD-10-CM

## 2011-08-20 DIAGNOSIS — E785 Hyperlipidemia, unspecified: Secondary | ICD-10-CM

## 2011-08-20 LAB — BASIC METABOLIC PANEL
BUN: 21 mg/dL (ref 6–23)
Chloride: 102 mEq/L (ref 96–112)
Creatinine, Ser: 1.1 mg/dL (ref 0.4–1.5)
Glucose, Bld: 140 mg/dL — ABNORMAL HIGH (ref 70–99)
Potassium: 4.8 mEq/L (ref 3.5–5.1)

## 2011-08-20 LAB — HEPATIC FUNCTION PANEL
Alkaline Phosphatase: 52 U/L (ref 39–117)
Bilirubin, Direct: 0.1 mg/dL (ref 0.0–0.3)
Total Bilirubin: 0.8 mg/dL (ref 0.3–1.2)
Total Protein: 7.2 g/dL (ref 6.0–8.3)

## 2011-08-20 LAB — HEMOGLOBIN A1C: Hgb A1c MFr Bld: 7.1 % — ABNORMAL HIGH (ref 4.6–6.5)

## 2011-08-20 LAB — LIPID PANEL
Cholesterol: 148 mg/dL (ref 0–200)
LDL Cholesterol: 69 mg/dL (ref 0–99)
VLDL: 32.6 mg/dL (ref 0.0–40.0)

## 2011-08-20 NOTE — Progress Notes (Signed)
  Subjective:    Patient ID: Miguel Vazquez, male    DOB: 1941-04-01, 71 y.o.   MRN: [id number]  HPI  Medical followup. Patient has hypertension treated with lisinopril HCTZ. He did not take his medication this morning. He has type 2 diabetes treated with metformin. Last A1c 6.0%. Rarely checking blood sugars. No symptoms of hyperglycemia. He denies any recent dizziness or chest pains. Hyperlipidemia treated with Vytorin. No myalgias. Blood pressures by home readings usually run 130/70.  Past Medical History  Diagnosis Date  . Hypertension   . Hyperlipidemia   . Diabetes mellitus    No past surgical history on file.  reports that he quit smoking about 25 years ago. His smoking use included Cigarettes. He has a 30 pack-year smoking history. He does not have any smokeless tobacco history on file. His alcohol and drug histories not on file. family history is not on file. No Known Allergies    Review of Systems  Constitutional: Negative for fatigue.  Eyes: Negative for visual disturbance.  Respiratory: Negative for cough, chest tightness and shortness of breath.   Cardiovascular: Negative for chest pain, palpitations and leg swelling.  Neurological: Negative for dizziness, syncope, weakness, light-headedness and headaches.       Objective:   Physical Exam  Constitutional: He is oriented to person, place, and time. He appears well-developed and well-nourished. No distress.  HENT:  Mouth/Throat: Oropharynx is clear and moist.  Neck: Neck supple. No thyromegaly present.  Cardiovascular: Normal rate and regular rhythm.   Pulmonary/Chest: Effort normal and breath sounds normal. No respiratory distress. He has no wheezes. He has no rales.  Musculoskeletal: He exhibits no edema.  Neurological: He is alert and oriented to person, place, and time. No cranial nerve deficit.          Assessment & Plan:  #1 hypertension. Elevation today but didn't take medication today and controlled by  home readings. Continue close monitoring. Check basic metabolic panel #2 hyperlipidemia. Check lipid and hepatic panel  #3 type 2 diabetes. Recently controlled. Has had some recent weight gain, however. Recheck A1c. Discussed importance of dietary compliance and exercise and work on weight loss. Routine followup 6 months.

## 2011-08-21 ENCOUNTER — Other Ambulatory Visit: Payer: Self-pay | Admitting: *Deleted

## 2011-08-21 NOTE — Progress Notes (Signed)
Quick Note:  Pt informed ______ 

## 2011-12-02 ENCOUNTER — Other Ambulatory Visit: Payer: Self-pay | Admitting: Family Medicine

## 2012-03-21 ENCOUNTER — Other Ambulatory Visit: Payer: Self-pay | Admitting: Family Medicine

## 2012-03-21 MED ORDER — EZETIMIBE-SIMVASTATIN 10-40 MG PO TABS: 1.0000 | ORAL_TABLET | Freq: Every day | ORAL | Status: DC

## 2012-03-21 NOTE — Telephone Encounter (Signed)
Pt needs new rx vytorin 10-40mg  call into [facility]

## 2012-03-24 ENCOUNTER — Other Ambulatory Visit: Payer: Self-pay | Admitting: Family Medicine

## 2012-05-21 ENCOUNTER — Encounter: Payer: Self-pay | Admitting: Family Medicine

## 2012-05-21 ENCOUNTER — Ambulatory Visit (INDEPENDENT_AMBULATORY_CARE_PROVIDER_SITE_OTHER): Payer: Medicare Other | Admitting: Family Medicine

## 2012-05-21 VITALS — BP 138/82 | Temp 99.0°F | Wt 189.0 lb

## 2012-05-21 DIAGNOSIS — E119 Type 2 diabetes mellitus without complications: Secondary | ICD-10-CM

## 2012-05-21 DIAGNOSIS — I10 Essential (primary) hypertension: Secondary | ICD-10-CM

## 2012-05-21 DIAGNOSIS — E785 Hyperlipidemia, unspecified: Secondary | ICD-10-CM

## 2012-05-21 LAB — BASIC METABOLIC PANEL
CO2: 28 mEq/L (ref 19–32)
Chloride: 102 mEq/L (ref 96–112)
Creatinine, Ser: 0.9 mg/dL (ref 0.4–1.5)
Potassium: 4.2 mEq/L (ref 3.5–5.1)

## 2012-05-21 LAB — HEPATIC FUNCTION PANEL
ALT: 27 U/L (ref 0–53)
AST: 23 U/L (ref 0–37)
Bilirubin, Direct: 0 mg/dL (ref 0.0–0.3)
Total Protein: 7.3 g/dL (ref 6.0–8.3)

## 2012-05-21 LAB — LIPID PANEL
Cholesterol: 140 mg/dL (ref 0–200)
Total CHOL/HDL Ratio: 4
Triglycerides: 195 mg/dL — ABNORMAL HIGH (ref 0.0–149.0)

## 2012-05-21 LAB — HEMOGLOBIN A1C: Hgb A1c MFr Bld: 7.6 % — ABNORMAL HIGH (ref 4.6–6.5)

## 2012-05-21 NOTE — Progress Notes (Signed)
  Subjective:    Patient ID: Miguel Vazquez, male    DOB: [birth year], 72 y.o.   MRN: [id number]  HPI Medical followup.  Hypertension treated with lisinopril HCTZ. Compliant with therapy. Blood pressures consistently around 120-130 over 80s at home No headaches or dizziness. No chest pains  Hyperlipidemia treated with Vytorin. No myalgias. No history of CAD or peripheral vascular disease  Type 2 diabetes. Takes metformin 1000 mg twice daily Not monitoring blood sugars regularly. No symptoms of hyperglycemia. Last A1c 7.1%. Recent eye exam reviewed. Small cataracts. No retinopathy.  Past Medical History  Diagnosis Date  . Hypertension   . Hyperlipidemia   . Diabetes mellitus    No past surgical history on file.  reports that he quit smoking about 25 years ago. His smoking use included Cigarettes. He has a 30 pack-year smoking history. He does not have any smokeless tobacco history on file. His alcohol and drug histories are not on file. family history is not on file. No Known Allergies    Review of Systems  Constitutional: Negative for appetite change, fatigue and unexpected weight change.  Eyes: Negative for visual disturbance.  Respiratory: Negative for cough, chest tightness and shortness of breath.   Cardiovascular: Negative for chest pain, palpitations and leg swelling.  Endocrine: Negative for polydipsia and polyuria.  Genitourinary: Negative for dysuria.  Neurological: Negative for dizziness, syncope, weakness, light-headedness and headaches.       Objective:   Physical Exam  Constitutional: He appears well-developed and well-nourished.  Neck: Neck supple. No thyromegaly present.  Cardiovascular: Normal rate and regular rhythm.  Exam reveals no gallop.   Pulmonary/Chest: Effort normal and breath sounds normal. No respiratory distress. He has no wheezes. He has no rales.  Musculoskeletal: He exhibits no edema.  Lymphadenopathy:    He has no cervical adenopathy.   Skin:  Feet reveal no skin lesions. Good distal foot pulses. Good capillary refill. No calluses. Normal sensation with monofilament testing           Assessment & Plan:  #1 type 2 diabetes. Recheck A1c. Work on weight loss #2 hypertension. Controlled by home readings. Check basic metabolic panel #3 hyperlipidemia. Check lipid and hepatic panel

## 2012-05-22 NOTE — Progress Notes (Signed)
Quick Note:  Pt informed ______ 

## 2012-07-23 ENCOUNTER — Other Ambulatory Visit: Payer: Self-pay | Admitting: Family Medicine

## 2012-09-09 ENCOUNTER — Other Ambulatory Visit: Payer: Self-pay | Admitting: Family Medicine

## 2012-09-11 ENCOUNTER — Other Ambulatory Visit: Payer: Self-pay | Admitting: Family Medicine

## 2012-11-18 ENCOUNTER — Other Ambulatory Visit: Payer: Self-pay

## 2012-11-18 ENCOUNTER — Ambulatory Visit (INDEPENDENT_AMBULATORY_CARE_PROVIDER_SITE_OTHER): Payer: Self-pay | Admitting: Family Medicine

## 2012-11-18 ENCOUNTER — Encounter: Payer: Self-pay | Admitting: Family Medicine

## 2012-11-18 VITALS — BP 128/70 | HR 68 | Temp 98.1°F | Wt 179.0 lb

## 2012-11-18 DIAGNOSIS — Z9889 Other specified postprocedural states: Secondary | ICD-10-CM

## 2012-11-18 DIAGNOSIS — E669 Obesity, unspecified: Secondary | ICD-10-CM | POA: Insufficient documentation

## 2012-11-18 DIAGNOSIS — E119 Type 2 diabetes mellitus without complications: Secondary | ICD-10-CM

## 2012-11-18 DIAGNOSIS — I10 Essential (primary) hypertension: Secondary | ICD-10-CM

## 2012-11-18 DIAGNOSIS — Z23 Encounter for immunization: Secondary | ICD-10-CM

## 2012-11-18 LAB — HEMOGLOBIN A1C: Hgb A1c MFr Bld: 6.7 % — ABNORMAL HIGH (ref 4.6–6.5)

## 2012-11-18 LAB — HM DIABETES FOOT EXAM: HM Diabetic Foot Exam: NORMAL

## 2012-11-18 MED ORDER — GLUCOSE BLOOD VI STRP: ORAL_STRIP | Status: DC

## 2012-11-18 NOTE — Progress Notes (Signed)
  Subjective:    Patient ID: Miguel Vazquez, male    DOB: Aug 12, 1940, 72 y.o.   MRN: [id number]  HPI Medical follow up  Hypertension treated with lisinopril HCTZ. Back around June 1 he had some low blood pressure readings and occasional lightheadedness. He reduced his lisinopril to one half tablet and blood pressures been very stable since then. No further dizziness.  Type 2 diabetes which is been fairly well controlled. Last A1c 7.6%. He made some lifestyle changes and has lost some weight about 10 pounds since then. He states recent eye exam normal. No symptoms of polyuria or polydipsia. Remains on metformin.  Hyperlipidemia treated with Vytorin. No myalgias. No other side effects. No history of documented Pneumovax and he denies ever receiving one  Past Medical History  Diagnosis Date  . Hypertension   . Hyperlipidemia   . Diabetes mellitus    No past surgical history on file.  reports that he quit smoking about 26 years ago. His smoking use included Cigarettes. He has a 30 pack-year smoking history. He does not have any smokeless tobacco history on file. His alcohol and drug histories are not on file. family history is not on file. No Known Allergies    Review of Systems  Constitutional: Negative for appetite change, fatigue and unexpected weight change.  Eyes: Negative for visual disturbance.  Respiratory: Negative for cough, chest tightness and shortness of breath.   Cardiovascular: Negative for chest pain, palpitations and leg swelling.  Neurological: Negative for dizziness, syncope, weakness, light-headedness and headaches.       Objective:   Physical Exam  Constitutional: He appears well-developed and well-nourished.  HENT:  Mouth/Throat: Oropharynx is clear and moist.  Neck: Neck supple. No thyromegaly present.  Cardiovascular: Normal rate and regular rhythm.  Exam reveals no gallop.   Pulmonary/Chest: Effort normal and breath sounds normal. No respiratory distress.  He has no wheezes. He has no rales.  Musculoskeletal: He exhibits no edema.  Lymphadenopathy:    He has no cervical adenopathy.          Assessment & Plan:  #1 type 2 diabetes. Repeat A1c. Hopefully improved with recent weight loss efforts. #2 hypertension. Stable with reduced dosage of blood pressure medication as above. Continue close home monitoring #3 health maintenance. Pneumovax given. Reminder for flu vaccine this fall

## 2012-11-24 ENCOUNTER — Other Ambulatory Visit: Payer: Self-pay | Admitting: Family Medicine

## 2012-12-03 ENCOUNTER — Telehealth: Payer: Self-pay | Admitting: Family Medicine

## 2012-12-03 NOTE — Telephone Encounter (Signed)
Pt states that when he left here last week, he went to the [facility] in [place] to pick up his meter and strips. He states that that pharmacy told him that he couldn't get a meter because they did not have a RX for one. They did offer him his strips, but he did not pick them up, as strips do him no good without a meter. Please assist.

## 2012-12-03 NOTE — Telephone Encounter (Signed)
Pt coming to pick up contour next meter 12-04-12

## 2013-02-05 ENCOUNTER — Telehealth: Payer: Self-pay | Admitting: Family Medicine

## 2013-02-05 NOTE — Telephone Encounter (Signed)
Left message for patient to return call.

## 2013-02-05 NOTE — Telephone Encounter (Signed)
FYI: patient does have appointment 05/2013. And he want need any RX till January

## 2013-02-05 NOTE — Telephone Encounter (Signed)
Pt wife stated her hus will switch INS next year and vytorin will not be covered. [facility] [place].

## 2013-02-05 NOTE — Telephone Encounter (Signed)
If no hx of intolerance, I recommend stop Vytorin and start Lipitor 40 mg once daily (refill for 6 months)  and will repeat lipids at follow up next visit.

## 2013-02-06 MED ORDER — ATORVASTATIN CALCIUM 40 MG PO TABS: 40.0000 mg | ORAL_TABLET | Freq: Every day | ORAL | Status: DC

## 2013-02-06 NOTE — Telephone Encounter (Signed)
New RX sent to pharmacy and patient is aware

## 2013-02-19 ENCOUNTER — Other Ambulatory Visit: Payer: Self-pay

## 2013-02-19 MED ORDER — LISINOPRIL-HYDROCHLOROTHIAZIDE 20-12.5 MG PO TABS: ORAL_TABLET | ORAL | Status: DC

## 2013-05-21 ENCOUNTER — Encounter: Payer: Self-pay | Admitting: Family Medicine

## 2013-05-21 ENCOUNTER — Ambulatory Visit (INDEPENDENT_AMBULATORY_CARE_PROVIDER_SITE_OTHER): Payer: Medicare HMO | Admitting: Family Medicine

## 2013-05-21 VITALS — BP 130/78 | HR 82 | Temp 97.5°F | Wt 181.0 lb

## 2013-05-21 DIAGNOSIS — E119 Type 2 diabetes mellitus without complications: Secondary | ICD-10-CM

## 2013-05-21 DIAGNOSIS — E785 Hyperlipidemia, unspecified: Secondary | ICD-10-CM

## 2013-05-21 DIAGNOSIS — I10 Essential (primary) hypertension: Secondary | ICD-10-CM

## 2013-05-21 LAB — LIPID PANEL
CHOLESTEROL: 149 mg/dL (ref 0–200)
HDL: 42.5 mg/dL (ref >39.00)
TRIGLYCERIDES: 210 mg/dL — AB (ref 0.0–149.0)
Total CHOL/HDL Ratio: 4
VLDL: 42 mg/dL — ABNORMAL HIGH (ref 0.0–40.0)

## 2013-05-21 LAB — LDL CHOLESTEROL, DIRECT: Direct LDL: 84.1 mg/dL

## 2013-05-21 LAB — BASIC METABOLIC PANEL
BUN: 20 mg/dL (ref 6–23)
CALCIUM: 10.6 mg/dL — AB (ref 8.4–10.5)
CO2: 29 meq/L (ref 19–32)
CREATININE: 1.1 mg/dL (ref 0.4–1.5)
Chloride: 99 mEq/L (ref 96–112)
GFR: 73.94 mL/min (ref >60.00)
Glucose, Bld: 128 mg/dL — ABNORMAL HIGH (ref 70–99)
Potassium: 4.7 mEq/L (ref 3.5–5.1)
SODIUM: 138 meq/L (ref 135–145)

## 2013-05-21 LAB — HEPATIC FUNCTION PANEL
ALBUMIN: 4.3 g/dL (ref 3.5–5.2)
ALT: 29 U/L (ref 0–53)
AST: 26 U/L (ref 0–37)
Alkaline Phosphatase: 50 U/L (ref 39–117)
BILIRUBIN TOTAL: 0.9 mg/dL (ref 0.3–1.2)
Bilirubin, Direct: 0.1 mg/dL (ref 0.0–0.3)
TOTAL PROTEIN: 7.7 g/dL (ref 6.0–8.3)

## 2013-05-21 LAB — HEMOGLOBIN A1C: HEMOGLOBIN A1C: 6.9 % — AB (ref 4.6–6.5)

## 2013-05-21 LAB — HM DIABETES FOOT EXAM: HM DIABETIC FOOT EXAM: NORMAL

## 2013-05-21 NOTE — Progress Notes (Signed)
   Subjective:    Patient ID: Miguel Vazquez, male    DOB: 17-Feb-1941, 73 y.o.   MRN: [id number]  HPI Patient seen for followup. Type 2 diabetes, hypertension, hyperlipidemia. Had previous abdominal aortic aneurysm repair.  Generally doing well with no specific complaints. Since last visit, we switched his Vytorin to Lipitor for cost savings. No myalgias. No chest pains. Occasional GERD symptoms. Medications reviewed and compliant with all. Denies any recent dizziness. Blood sugars not marked regularly. Last A1c 6.7%. He is scheduled for eye exam.  He has never had screening colonoscopy. He is asymptomatic in terms of any bowel habit changes. He would like to pursue colonoscopy this time.  Past Medical History  Diagnosis Date  . Hypertension   . Hyperlipidemia   . Diabetes mellitus    No past surgical history on file.  reports that he quit smoking about 26 years ago. His smoking use included Cigarettes. He has a 30 pack-year smoking history. He does not have any smokeless tobacco history on file. His alcohol and drug histories are not on file. family history is not on file. No Known Allergies    Review of Systems  Constitutional: Negative for fatigue.  Eyes: Negative for visual disturbance.  Respiratory: Negative for cough, chest tightness and shortness of breath.   Cardiovascular: Negative for chest pain, palpitations and leg swelling.  Gastrointestinal: Negative for abdominal pain.  Endocrine: Negative for polydipsia and polyuria.  Neurological: Negative for dizziness, syncope, weakness, light-headedness and headaches.  Psychiatric/Behavioral: Negative for dysphoric mood.       Objective:   Physical Exam  Constitutional: He is oriented to person, place, and time. He appears well-developed and well-nourished.  HENT:  Right Ear: External ear normal.  Left Ear: External ear normal.  Mouth/Throat: Oropharynx is clear and moist.  Eyes: Pupils are equal, round, and reactive to  light.  Neck: Neck supple. No thyromegaly present.  Cardiovascular: Normal rate and regular rhythm.   Pulmonary/Chest: Effort normal and breath sounds normal. No respiratory distress. He has no wheezes. He has no rales.  Musculoskeletal: He exhibits no edema.  Neurological: He is alert and oriented to person, place, and time.  Skin:  Feet reveal some mild drying but no specific lesions. Normal monofilament testing          Assessment & Plan:  #1 type 2 diabetes. History of excellent control. Continue metformin. Recheck A1c. He is scheduled for eye exam #2 hyperlipidemia. Repeat lipid and hepatic panel with recent change to atorvastatin #3 hypertension which is stable at goal #4 health maintenance. Schedule colonoscopy which he has never had previously

## 2013-05-21 NOTE — Progress Notes (Signed)
Pre visit review using our clinic review tool, if applicable. No additional management support is needed unless otherwise documented below in the visit note. 

## 2013-05-21 NOTE — Patient Instructions (Signed)
We will call you regarding colonoscopy. Make sure you follow up with eye exam.

## 2013-05-22 ENCOUNTER — Telehealth: Payer: Self-pay

## 2013-05-22 ENCOUNTER — Telehealth: Payer: Self-pay | Admitting: Family Medicine

## 2013-05-22 NOTE — Telephone Encounter (Signed)
Relevant patient education assigned to patient using Emmi. ° °

## 2013-06-02 LAB — HM DIABETES EYE EXAM

## 2013-07-14 ENCOUNTER — Encounter: Payer: Self-pay | Admitting: Internal Medicine

## 2013-07-25 ENCOUNTER — Other Ambulatory Visit: Payer: Self-pay | Admitting: Family Medicine

## 2013-08-19 ENCOUNTER — Ambulatory Visit (AMBULATORY_SURGERY_CENTER): Payer: Self-pay | Admitting: *Deleted

## 2013-08-19 VITALS — Ht 68.5 in | Wt 179.4 lb

## 2013-08-19 DIAGNOSIS — Z1211 Encounter for screening for malignant neoplasm of colon: Secondary | ICD-10-CM

## 2013-08-19 MED ORDER — NA SULFATE-K SULFATE-MG SULF 17.5-3.13-1.6 GM/177ML PO SOLN: 1.0000 | Freq: Once | ORAL | Status: DC

## 2013-08-19 NOTE — Progress Notes (Signed)
No allergies to eggs or soy. No problems with anesthesia.  Pt given Emmi instructions for colonoscopy  No oxygen use  No diet drug use  

## 2013-09-02 ENCOUNTER — Ambulatory Visit (AMBULATORY_SURGERY_CENTER): Payer: Medicare HMO | Admitting: Internal Medicine

## 2013-09-02 ENCOUNTER — Encounter: Payer: Self-pay | Admitting: Internal Medicine

## 2013-09-02 VITALS — BP 111/70 | HR 66 | Temp 96.1°F | Resp 19 | Ht 68.0 in | Wt 179.0 lb

## 2013-09-02 DIAGNOSIS — D128 Benign neoplasm of rectum: Secondary | ICD-10-CM

## 2013-09-02 DIAGNOSIS — D129 Benign neoplasm of anus and anal canal: Secondary | ICD-10-CM

## 2013-09-02 DIAGNOSIS — K5730 Diverticulosis of large intestine without perforation or abscess without bleeding: Secondary | ICD-10-CM

## 2013-09-02 DIAGNOSIS — D126 Benign neoplasm of colon, unspecified: Secondary | ICD-10-CM

## 2013-09-02 DIAGNOSIS — Z1211 Encounter for screening for malignant neoplasm of colon: Secondary | ICD-10-CM

## 2013-09-02 LAB — GLUCOSE, CAPILLARY
Glucose-Capillary: 144 mg/dL — ABNORMAL HIGH (ref 70–99)
Glucose-Capillary: 120 mg/dL — ABNORMAL HIGH (ref 70–99)

## 2013-09-02 MED ORDER — SODIUM CHLORIDE 0.9 % IV SOLN: 500.0000 mL | INTRAVENOUS | Status: DC

## 2013-09-02 NOTE — Progress Notes (Signed)
Called to room to assist during endoscopic procedure.  Patient ID and intended procedure confirmed with present staff. Received instructions for my participation in the procedure from the performing physician.  

## 2013-09-02 NOTE — Op Note (Signed)
[facility]  [address] [place], [id number]   COLONOSCOPY PROCEDURE REPORT  PATIENT: Miguel Vazquez, Miguel Vazquez  MR#: [id number] BIRTHDATE: 11-18-1940 , 72  yrs. old GENDER: Male ENDOSCOPIST: Gatha Mayer, MD, [facility] REFERRED PV:VZSMO Elease Hashimoto, M.D. PROCEDURE DATE:  09/02/2013 PROCEDURE:   Colonoscopy with biopsy and snare polypectomy First Screening Colonoscopy - Avg.  risk and is 50 yrs.  old or older Yes.  Prior Negative Screening - Now for repeat screening. N/A  History of Adenoma - Now for follow-up colonoscopy & has been > or = to 3 yrs.  N/A  Polyps Removed Today? Yes. ASA CLASS:   Class II INDICATIONS:average risk screening and first colonoscopy. MEDICATIONS: propofol (Diprivan) 200mg  IV, MAC sedation, administered by CRNA, and These medications were titrated to patient response per physician's verbal order  DESCRIPTION OF PROCEDURE:   After the risks benefits and alternatives of the procedure were thoroughly explained, informed consent was obtained.  A digital rectal exam revealed no abnormalities of the rectum, A digital rectal exam revealed no prostatic nodules, and A digital rectal exam revealed the prostate was not enlarged.   The LB [id number]  endoscope was introduced through the anus and advanced to the cecum, which was identified by both the appendix and ileocecal valve. No adverse events experienced.   The quality of the prep was Suprep good  The instrument was then slowly withdrawn as the colon was fully examined.  COLON FINDINGS: Five diminutive sessile polyps were found in the transverse colon and rectum.  A polypectomy was performed with cold forceps and with a cold snare.  The resection was complete and the polyp tissue was completely retrieved.   Moderate diverticulosis was noted throughout the entire examined colon.   The colon mucosa was otherwise normal.  Retroflexed views revealed no abnormalities. The time to cecum=5 minutes 43  seconds.  Withdrawal time=12 minutes 01 seconds.  The scope was withdrawn and the procedure completed. COMPLICATIONS: There were no complications.     ENDOSCOPIC IMPRESSION: 1.   Five diminutive sessile polyps were found in the transverse colon and rectum; polypectomy was performed with cold forceps and with a cold snare 2.   Moderate diverticulosis was noted throughout the entire examined colon 3.   The colon mucosa was otherwise normal  RECOMMENDATIONS: Timing of repeat colonoscopy will be determined by pathology findings.   eSigned:  Gatha Mayer, MD, [facility] 09/02/2013 10:42 AM   cc: The Patient and Carolann Littler, MD   PATIENT NAME:  Miguel Vazquez, Miguel Vazquez MR#: [id number]

## 2013-09-02 NOTE — Progress Notes (Signed)
A/ox3 pleased with MAC, report to Kristin RN 

## 2013-09-02 NOTE — Patient Instructions (Addendum)
I found and removed 5 small polyps that look benign. You also have a condition called diverticulosis - common and not usually a problem. Please read the handout provided.  I will let you know pathology results and when to have another routine colonoscopy by mail.  I appreciate the opportunity to care for you. Gatha Mayer, MD, FACG  YOU HAD AN ENDOSCOPIC PROCEDURE TODAY AT [facility]: Refer to the procedure report that was given to you for any specific questions about what was found during the examination.  If the procedure report does not answer your questions, please call your gastroenterologist to clarify.  If you requested that your care partner not be given the details of your procedure findings, then the procedure report has been included in a sealed envelope for you to review at your convenience later.  YOU SHOULD EXPECT: Some feelings of bloating in the abdomen. Passage of more gas than usual.  Walking can help get rid of the air that was put into your GI tract during the procedure and reduce the bloating. If you had a lower endoscopy (such as a colonoscopy or flexible sigmoidoscopy) you may notice spotting of blood in your stool or on the toilet paper. If you underwent a bowel prep for your procedure, then you may not have a normal bowel movement for a few days.  DIET: Your first meal following the procedure should be a light meal and then it is ok to progress to your normal diet.  A half-sandwich or bowl of soup is an example of a good first meal.  Heavy or fried foods are harder to digest and may make you feel nauseous or bloated.  Likewise meals heavy in dairy and vegetables can cause extra gas to form and this can also increase the bloating.  Drink plenty of fluids but you should avoid alcoholic beverages for 24 hours.  ACTIVITY: Your care partner should take you home directly after the procedure.  You should plan to take it easy, moving slowly for the rest of the day.   You can resume normal activity the day after the procedure however you should NOT DRIVE or use heavy machinery for 24 hours (because of the sedation medicines used during the test).    SYMPTOMS TO REPORT IMMEDIATELY: A gastroenterologist can be reached at any hour.  During normal business hours, 8:30 AM to 5:00 PM Monday through Friday, call [phone].  After hours and on weekends, please call the GI answering service at [phone] who will take a message and have the physician on call contact you.   Following lower endoscopy (colonoscopy or flexible sigmoidoscopy):  Excessive amounts of blood in the stool  Significant tenderness or worsening of abdominal pains  Swelling of the abdomen that is new, acute  Fever of 100F or higher  FOLLOW UP: If any biopsies were taken you will be contacted by phone or by letter within the next 1-3 weeks.  Call your gastroenterologist if you have not heard about the biopsies in 3 weeks.  Our staff will call the home number listed on your records the next business day following your procedure to check on you and address any questions or concerns that you may have at that time regarding the information given to you following your procedure. This is a courtesy call and so if there is no answer at the home number and we have not heard from you through the emergency physician on call, we will assume  that you have returned to your regular daily activities without incident.  SIGNATURES/CONFIDENTIALITY: You and/or your care partner have signed paperwork which will be entered into your electronic medical record.  These signatures attest to the fact that that the information above on your After Visit Summary has been reviewed and is understood.  Full responsibility of the confidentiality of this discharge information lies with you and/or your care-partner.  Continue your normal medications  Please read handouts about polyps and diverticulosis

## 2013-09-03 ENCOUNTER — Telehealth: Payer: Self-pay | Admitting: *Deleted

## 2013-09-03 NOTE — Telephone Encounter (Signed)
  Follow up Call-  Call back number 09/02/2013  Post procedure Call Back phone  # [phone]  Permission to leave phone message Yes     Patient questions:  Do you have a fever, pain , or abdominal swelling? no Pain Score  0 *  Have you tolerated food without any problems? yes  Have you been able to return to your normal activities? yes  Do you have any questions about your discharge instructions: Diet   no Medications  no Follow up visit  no  Do you have questions or concerns about your Care? no  Actions: * If pain score is 4 or above: No action needed, pain <4.

## 2013-09-09 ENCOUNTER — Encounter: Payer: Self-pay | Admitting: Internal Medicine

## 2013-09-09 DIAGNOSIS — Z860100 Personal history of colon polyps, unspecified: Secondary | ICD-10-CM

## 2013-09-09 HISTORY — DX: Personal history of colon polyps, unspecified: Z86.0100

## 2013-09-09 HISTORY — DX: Personal history of colonic polyps: Z86.010

## 2013-09-09 NOTE — Progress Notes (Signed)
Quick Note:  3 diminutive tubular adenomas Repeat colon 2018 ______

## 2013-11-11 ENCOUNTER — Other Ambulatory Visit: Payer: Self-pay | Admitting: Family Medicine

## 2013-11-18 ENCOUNTER — Ambulatory Visit (INDEPENDENT_AMBULATORY_CARE_PROVIDER_SITE_OTHER): Payer: Medicare HMO | Admitting: Family Medicine

## 2013-11-18 ENCOUNTER — Encounter: Payer: Self-pay | Admitting: Family Medicine

## 2013-11-18 VITALS — BP 136/78 | HR 76 | Temp 97.4°F | Wt 180.0 lb

## 2013-11-18 DIAGNOSIS — Z23 Encounter for immunization: Secondary | ICD-10-CM

## 2013-11-18 DIAGNOSIS — I10 Essential (primary) hypertension: Secondary | ICD-10-CM

## 2013-11-18 DIAGNOSIS — E119 Type 2 diabetes mellitus without complications: Secondary | ICD-10-CM

## 2013-11-18 DIAGNOSIS — E785 Hyperlipidemia, unspecified: Secondary | ICD-10-CM

## 2013-11-18 LAB — HEMOGLOBIN A1C: Hgb A1c MFr Bld: 6.9 % — ABNORMAL HIGH (ref 4.6–6.5)

## 2013-11-18 NOTE — Addendum Note (Signed)
Addended by: Marcina Millard on: 11/18/2013 10:49 AM   Modules accepted: Orders

## 2013-11-18 NOTE — Progress Notes (Signed)
Pre visit review using our clinic review tool, if applicable. No additional management support is needed unless otherwise documented below in the visit note. 

## 2013-11-18 NOTE — Progress Notes (Signed)
   Subjective:    Patient ID: Miguel Vazquez, male    DOB: 1940-09-28, 73 y.o.   MRN: [id number]  HPI Medical follow up  Type 2 diabetes. History of good control. Remains on metformin. Does not monitor blood sugars regularly. No symptoms of polyuria or polydipsia.  Hypertension treated with lisinopril HCTZ. Blood pressure stable. No dizziness. No headaches. No recent chest pains.  Hyperlipidemia on Lipitor. No myalgias. Lipids were checked last winter and LDL at goal.  Patient needs flu vaccine. No contraindications.  Past Medical History  Diagnosis Date  . Hypertension   . Hyperlipidemia   . Diabetes mellitus   . Arthritis   . Hx of abdominal aortic aneurysm 1998  . Personal history of colonic polyps - adenomas 09/09/2013   Past Surgical History  Procedure Laterality Date  . Abdominal aortic aneurysm repair  1998  . Umbilical hernia repair  1965    reports that he quit smoking about 27 years ago. His smoking use included Cigarettes. He has a 30 pack-year smoking history. He has never used smokeless tobacco. He reports that he drinks about 4.2 ounces of alcohol per week. He reports that he does not use illicit drugs. family history is negative for Colon cancer. No Known Allergies    Review of Systems  Constitutional: Negative for fatigue.  Eyes: Negative for visual disturbance.  Respiratory: Negative for cough, chest tightness and shortness of breath.   Cardiovascular: Negative for chest pain, palpitations and leg swelling.  Endocrine: Negative for polydipsia and polyuria.  Neurological: Negative for dizziness, syncope, weakness, light-headedness and headaches.       Objective:   Physical Exam  Constitutional: He is oriented to person, place, and time. He appears well-developed and well-nourished.  HENT:  Right Ear: External ear normal.  Left Ear: External ear normal.  Mouth/Throat: Oropharynx is clear and moist.  Eyes: Pupils are equal, round, and reactive to light.   Neck: Neck supple. No thyromegaly present.  Cardiovascular: Normal rate and regular rhythm.   Pulmonary/Chest: Effort normal and breath sounds normal. No respiratory distress. He has no wheezes. He has no rales.  Musculoskeletal: He exhibits no edema.  Neurological: He is alert and oriented to person, place, and time.          Assessment & Plan:  #1 type 2 diabetes. History of good control. Recheck A1c. Continue yearly eye exam. Flu vaccine given #2 hypertension. Stable. Continue current medications #3 hyperlipidemia. Continue Lipitor. Repeat lipids at followup in 6 months

## 2013-11-26 ENCOUNTER — Other Ambulatory Visit: Payer: Self-pay | Admitting: Family Medicine

## 2013-12-17 ENCOUNTER — Other Ambulatory Visit: Payer: Self-pay | Admitting: Family Medicine

## 2014-03-13 ENCOUNTER — Other Ambulatory Visit: Payer: Self-pay | Admitting: Family Medicine

## 2014-04-02 HISTORY — PX: OTHER SURGICAL HISTORY: SHX169

## 2014-04-18 ENCOUNTER — Other Ambulatory Visit: Payer: Self-pay | Admitting: Family Medicine

## 2014-05-04 ENCOUNTER — Other Ambulatory Visit: Payer: Self-pay | Admitting: Family Medicine

## 2014-05-21 ENCOUNTER — Other Ambulatory Visit: Payer: Self-pay | Admitting: Family Medicine

## 2014-05-21 ENCOUNTER — Ambulatory Visit (INDEPENDENT_AMBULATORY_CARE_PROVIDER_SITE_OTHER): Payer: Medicare HMO | Admitting: Family Medicine

## 2014-05-21 ENCOUNTER — Encounter: Payer: Self-pay | Admitting: Family Medicine

## 2014-05-21 VITALS — BP 130/80 | HR 66 | Temp 97.5°F | Wt 177.0 lb

## 2014-05-21 DIAGNOSIS — Z23 Encounter for immunization: Secondary | ICD-10-CM

## 2014-05-21 DIAGNOSIS — E785 Hyperlipidemia, unspecified: Secondary | ICD-10-CM

## 2014-05-21 DIAGNOSIS — I10 Essential (primary) hypertension: Secondary | ICD-10-CM

## 2014-05-21 DIAGNOSIS — E119 Type 2 diabetes mellitus without complications: Secondary | ICD-10-CM

## 2014-05-21 LAB — LIPID PANEL
Cholesterol: 165 mg/dL (ref 0–200)
HDL: 38.9 mg/dL — AB (ref >39.00)
LDL Cholesterol: 92 mg/dL (ref 0–99)
NONHDL: 126.1
Total CHOL/HDL Ratio: 4
Triglycerides: 173 mg/dL — ABNORMAL HIGH (ref 0.0–149.0)
VLDL: 34.6 mg/dL (ref 0.0–40.0)

## 2014-05-21 LAB — HEPATIC FUNCTION PANEL
ALBUMIN: 4.2 g/dL (ref 3.5–5.2)
ALT: 19 U/L (ref 0–53)
AST: 20 U/L (ref 0–37)
Alkaline Phosphatase: 63 U/L (ref 39–117)
Bilirubin, Direct: 0.1 mg/dL (ref 0.0–0.3)
Total Bilirubin: 0.7 mg/dL (ref 0.2–1.2)
Total Protein: 7.4 g/dL (ref 6.0–8.3)

## 2014-05-21 LAB — BASIC METABOLIC PANEL
BUN: 20 mg/dL (ref 6–23)
CO2: 31 mEq/L (ref 19–32)
CREATININE: 1.1 mg/dL (ref 0.40–1.50)
Calcium: 10 mg/dL (ref 8.4–10.5)
Chloride: 101 mEq/L (ref 96–112)
GFR: 69.87 mL/min (ref >60.00)
Glucose, Bld: 138 mg/dL — ABNORMAL HIGH (ref 70–99)
Potassium: 4.8 mEq/L (ref 3.5–5.1)
SODIUM: 139 meq/L (ref 135–145)

## 2014-05-21 LAB — HEMOGLOBIN A1C: Hgb A1c MFr Bld: 7.2 % — ABNORMAL HIGH (ref 4.6–6.5)

## 2014-05-21 NOTE — Progress Notes (Signed)
Pre visit review using our clinic review tool, if applicable. No additional management support is needed unless otherwise documented below in the visit note. 

## 2014-05-21 NOTE — Progress Notes (Signed)
   Subjective:    Patient ID: Miguel Vazquez, male    DOB: Dec 11, 1940, 74 y.o.   MRN: [id number]  HPI   Patient seen for routine medical follow-up. His chronic problems include history of type 2 diabetes, hypertension, hyperlipidemia. Blood sugars been generally very stable by fastings. A1c is been well controlled. No polyuria or polydipsia. Hypertension treated with lisinopril HCTZ and stable. No chest pains. No dizziness. He takes atorvastatin for hyperlipidemia. No myalgias. Nonsmoker. Walks some for exercise. Compliant with all medications and denies any side effects  Needs Prevnar 13. Also no history of shingles vaccine. He is requesting this today. No contraindications.  Past Medical History  Diagnosis Date  . Hypertension   . Hyperlipidemia   . Diabetes mellitus   . Arthritis   . Hx of abdominal aortic aneurysm 1998  . Personal history of colonic polyps - adenomas 09/09/2013   Past Surgical History  Procedure Laterality Date  . Abdominal aortic aneurysm repair  1998  . Umbilical hernia repair  1965    reports that he quit smoking about 27 years ago. His smoking use included Cigarettes. He has a 30 pack-year smoking history. He has never used smokeless tobacco. He reports that he drinks about 4.2 oz of alcohol per week. He reports that he does not use illicit drugs. family history is negative for Colon cancer. No Known Allergies    Review of Systems  Constitutional: Negative for fatigue.  Eyes: Negative for visual disturbance.  Respiratory: Negative for cough, chest tightness and shortness of breath.   Cardiovascular: Negative for chest pain, palpitations and leg swelling.  Endocrine: Negative for polydipsia and polyuria.  Genitourinary: Negative for dysuria.  Neurological: Negative for dizziness, syncope, weakness, light-headedness and headaches.       Objective:   Physical Exam  Constitutional: He is oriented to person, place, and time. He appears well-developed and  well-nourished.  Neck: Neck supple. No thyromegaly present.  Cardiovascular: Normal rate and regular rhythm.  Exam reveals no gallop.   Pulmonary/Chest: Effort normal and breath sounds normal. No respiratory distress. He has no wheezes. He has no rales.  Musculoskeletal: He exhibits no edema.  Neurological: He is alert and oriented to person, place, and time.  Skin:  Feet reveal no skin lesions. Good distal foot pulses. Good capillary refill. No calluses. Normal sensation with monofilament testing           Assessment & Plan:  #1 type 2 diabetes. History of good control. Recheck A1c. He is due for repeat eye exam this month #2 hypertension stable and at goal. Continue lisinopril HCTZ. Check basic metabolic panel #3 hyperlipidemia. Repeat lipid and hepatic panel. Continue atorvastatin #4 health maintenance. Shingles vaccine discussed and he is requesting this. No contraindications. Prevnar 13 also given

## 2014-05-24 ENCOUNTER — Other Ambulatory Visit: Payer: Self-pay | Admitting: Family Medicine

## 2014-05-24 DIAGNOSIS — E119 Type 2 diabetes mellitus without complications: Secondary | ICD-10-CM

## 2014-06-08 LAB — HM DIABETES EYE EXAM

## 2014-06-21 ENCOUNTER — Other Ambulatory Visit: Payer: Self-pay | Admitting: Family Medicine

## 2014-06-21 ENCOUNTER — Encounter: Payer: Self-pay | Admitting: Family Medicine

## 2014-07-13 LAB — HM DIABETES EYE EXAM

## 2014-08-26 ENCOUNTER — Other Ambulatory Visit: Payer: Self-pay | Admitting: Family Medicine

## 2014-09-19 ENCOUNTER — Other Ambulatory Visit: Payer: Self-pay | Admitting: Family Medicine

## 2014-10-16 ENCOUNTER — Other Ambulatory Visit: Payer: Self-pay | Admitting: Family Medicine

## 2014-11-05 ENCOUNTER — Other Ambulatory Visit: Payer: Self-pay | Admitting: Family Medicine

## 2014-11-22 ENCOUNTER — Ambulatory Visit (INDEPENDENT_AMBULATORY_CARE_PROVIDER_SITE_OTHER): Payer: Medicare HMO | Admitting: Family Medicine

## 2014-11-22 ENCOUNTER — Encounter: Payer: Self-pay | Admitting: Family Medicine

## 2014-11-22 VITALS — BP 126/72 | HR 68 | Temp 97.7°F | Wt 175.0 lb

## 2014-11-22 DIAGNOSIS — R1013 Epigastric pain: Secondary | ICD-10-CM | POA: Diagnosis not present

## 2014-11-22 DIAGNOSIS — K219 Gastro-esophageal reflux disease without esophagitis: Secondary | ICD-10-CM

## 2014-11-22 MED ORDER — PANTOPRAZOLE SODIUM 40 MG PO TBEC: 40.0000 mg | DELAYED_RELEASE_TABLET | Freq: Every day | ORAL | Status: DC

## 2014-11-22 NOTE — Progress Notes (Signed)
Pre visit review using our clinic review tool, if applicable. No additional management support is needed unless otherwise documented below in the visit note. 

## 2014-11-22 NOTE — Patient Instructions (Signed)
Food Choices for Gastroesophageal Reflux Disease When you have gastroesophageal reflux disease (GERD), the foods you eat and your eating habits are very important. Choosing the right foods can help ease the discomfort of GERD. WHAT GENERAL GUIDELINES DO I NEED TO FOLLOW?  Choose fruits, vegetables, whole grains, low-fat dairy products, and low-fat meat, fish, and poultry.  Limit fats such as oils, salad dressings, butter, nuts, and avocado.  Keep a food diary to identify foods that cause symptoms.  Avoid foods that cause reflux. These may be different for different people.  Eat frequent small meals instead of three large meals each day.  Eat your meals slowly, in a relaxed setting.  Limit fried foods.  Cook foods using methods other than frying.  Avoid drinking alcohol.  Avoid drinking large amounts of liquids with your meals.  Avoid bending over or lying down until 2-3 hours after eating. WHAT FOODS ARE NOT RECOMMENDED? The following are some foods and drinks that may worsen your symptoms: Vegetables Tomatoes. Tomato juice. Tomato and spaghetti sauce. Chili peppers. Onion and garlic. Horseradish. Fruits Oranges, grapefruit, and lemon (fruit and juice). Meats High-fat meats, fish, and poultry. This includes hot dogs, ribs, ham, sausage, salami, and bacon. Dairy Whole milk and chocolate milk. Sour cream. Cream. Butter. Ice cream. Cream cheese.  Beverages Coffee and tea, with or without caffeine. Carbonated beverages or energy drinks. Condiments Hot sauce. Barbecue sauce.  Sweets/Desserts Chocolate and cocoa. Donuts. Peppermint and spearmint. Fats and Oils High-fat foods, including [place] fries and potato chips. Other Vinegar. Strong spices, such as black pepper, white pepper, red pepper, cayenne, curry powder, cloves, ginger, and chili powder. The items listed above may not be a complete list of foods and beverages to avoid. Contact your dietitian for more  information. Document Released: 03/19/2005 Document Revised: 03/24/2013 Document Reviewed: 01/21/2013 [facility] Patient Information 2015 [place], [place]. This information is not intended to replace advice given to you by your health care provider. Make sure you discuss any questions you have with your health care provider.  Consider elevate head of bed 6 inches Try to avoid eating within 2 hours of bedtime.

## 2014-11-22 NOTE — Progress Notes (Signed)
   Subjective:    Patient ID: Miguel Vazquez, male    DOB: Feb 13, 1941, 74 y.o.   MRN: [id number]  HPI Patient seen with chief complaint of "indigestion ". He relates for the past several weeks he has had some frequent belching and feelings of distention after eating certain foods such as high fat foods and bananas. He's also occasionally notices with citric acid foods. He states he had some loss of appetite and weight loss but weight is down just 2 pounds compared to February. He denies any dysphagia or pain with swallowing. No chest pains. Belching alleviates his pain and discomfort. He denies any focal or localizing abdominal pain.  He has not taken anything other than occasional TUMS which seems to help. His symptoms seem to be slightly worse at night. He does frequently eat within 30 minutes to 1 hour bedtime. Denies any stool changes. No vomiting. No nausea.  Past Medical History  Diagnosis Date  . Hypertension   . Hyperlipidemia   . Diabetes mellitus   . Arthritis   . Hx of abdominal aortic aneurysm 1998  . Personal history of colonic polyps - adenomas 09/09/2013   Past Surgical History  Procedure Laterality Date  . Abdominal aortic aneurysm repair  1998  . Umbilical hernia repair  1965    reports that he quit smoking about 28 years ago. His smoking use included Cigarettes. He has a 30 pack-year smoking history. He has never used smokeless tobacco. He reports that he drinks about 4.2 oz of alcohol per week. He reports that he does not use illicit drugs. family history is negative for Colon cancer. No Known Allergies    Review of Systems  Constitutional: Positive for appetite change. Negative for fever, chills and unexpected weight change.  HENT: Negative for trouble swallowing.   Respiratory: Negative for cough.   Cardiovascular: Negative for chest pain.  Gastrointestinal: Positive for abdominal distention. Negative for nausea, vomiting, diarrhea and blood in stool.    Genitourinary: Negative for dysuria.  Neurological: Negative for dizziness.       Objective:   Physical Exam  Constitutional: He appears well-developed and well-nourished. No distress.  Neck: Neck supple. No thyromegaly present.  Cardiovascular: Normal rate and regular rhythm.   Pulmonary/Chest: Effort normal and breath sounds normal. No respiratory distress. He has no wheezes. He has no rales.  Abdominal: Soft. Bowel sounds are normal. He exhibits no distension and no mass. There is no tenderness. There is no rebound and no guarding.          Assessment & Plan:  Dyspepsia. Patient describes frequent belching, especially after eating. Suspect he has some component of GERD. Recommend elevate head of bed 6-8 inches. Avoid eating within 2-3 hours of bedtime. Dietary modification advised. Start Protonix 40 mg once daily. Reassess one month

## 2014-12-08 ENCOUNTER — Ambulatory Visit (INDEPENDENT_AMBULATORY_CARE_PROVIDER_SITE_OTHER): Payer: Medicare HMO | Admitting: Family Medicine

## 2014-12-08 ENCOUNTER — Encounter: Payer: Self-pay | Admitting: Family Medicine

## 2014-12-08 VITALS — BP 130/80 | HR 74 | Temp 97.4°F | Wt 174.0 lb

## 2014-12-08 DIAGNOSIS — R630 Anorexia: Secondary | ICD-10-CM | POA: Diagnosis not present

## 2014-12-08 DIAGNOSIS — R142 Eructation: Secondary | ICD-10-CM | POA: Diagnosis not present

## 2014-12-08 DIAGNOSIS — E119 Type 2 diabetes mellitus without complications: Secondary | ICD-10-CM | POA: Diagnosis not present

## 2014-12-08 DIAGNOSIS — R1013 Epigastric pain: Secondary | ICD-10-CM

## 2014-12-08 LAB — COMPREHENSIVE METABOLIC PANEL
ALT: 17 U/L (ref 0–53)
AST: 18 U/L (ref 0–37)
Albumin: 4.1 g/dL (ref 3.5–5.2)
Alkaline Phosphatase: 51 U/L (ref 39–117)
BILIRUBIN TOTAL: 0.7 mg/dL (ref 0.2–1.2)
BUN: 34 mg/dL — ABNORMAL HIGH (ref 6–23)
CO2: 29 meq/L (ref 19–32)
Calcium: 9.8 mg/dL (ref 8.4–10.5)
Chloride: 100 mEq/L (ref 96–112)
Creatinine, Ser: 1.28 mg/dL (ref 0.40–1.50)
GFR: 58.57 mL/min — AB (ref >60.00)
Glucose, Bld: 165 mg/dL — ABNORMAL HIGH (ref 70–99)
POTASSIUM: 4.1 meq/L (ref 3.5–5.1)
SODIUM: 138 meq/L (ref 135–145)
TOTAL PROTEIN: 6.9 g/dL (ref 6.0–8.3)

## 2014-12-08 LAB — CBC WITH DIFFERENTIAL/PLATELET
BASOS ABS: 0.1 10*3/uL (ref 0.0–0.1)
Basophils Relative: 1 % (ref 0.0–3.0)
Eosinophils Absolute: 0.6 10*3/uL (ref 0.0–0.7)
Eosinophils Relative: 6.9 % — ABNORMAL HIGH (ref 0.0–5.0)
HEMATOCRIT: 41.8 % (ref 39.0–52.0)
HEMOGLOBIN: 14 g/dL (ref 13.0–17.0)
LYMPHS PCT: 17.2 % (ref 12.0–46.0)
Lymphs Abs: 1.5 10*3/uL (ref 0.7–4.0)
MCHC: 33.6 g/dL (ref 30.0–36.0)
MCV: 98.3 fl (ref 78.0–100.0)
Monocytes Absolute: 0.6 10*3/uL (ref 0.1–1.0)
Monocytes Relative: 6.4 % (ref 3.0–12.0)
Neutro Abs: 5.9 10*3/uL (ref 1.4–7.7)
Neutrophils Relative %: 68.5 % (ref 43.0–77.0)
Platelets: 181 10*3/uL (ref 150.0–400.0)
RBC: 4.25 Mil/uL (ref 4.22–5.81)
RDW: 13.9 % (ref 11.5–15.5)
WBC: 8.6 10*3/uL (ref 4.0–10.5)

## 2014-12-08 LAB — LIPASE: LIPASE: 37 U/L (ref 11.0–59.0)

## 2014-12-08 LAB — HEMOGLOBIN A1C: HEMOGLOBIN A1C: 6.8 % — AB (ref 4.6–6.5)

## 2014-12-08 NOTE — Progress Notes (Signed)
   Subjective:    Patient ID: Miguel Vazquez, male    DOB: 1941-02-28, 74 y.o.   MRN: [id number]  HPI Patient seen with some persistent dyspepsia, loss of appetite, and mild weight loss. Refer to recent note:  "Patient seen with chief complaint of "indigestion ". He relates for the past several weeks he has had some frequent belching and feelings of distention after eating certain foods such as high fat foods and bananas. He's also occasionally notices with citric acid foods. He states he had some loss of appetite and weight loss but weight is down just 2 pounds compared to February. He denies any dysphagia or pain with swallowing. No chest pains. Belching alleviates his pain and discomfort. He denies any focal or localizing abdominal pain.  He has not taken anything other than occasional TUMS which seems to help. His symptoms seem to be slightly worse at night. He does frequently eat within 30 minutes to 1 hour bedtime. Denies any stool changes. No vomiting. No nausea."  We suspect that he may have some GERD issues and started Protonix which has not helped much thus far. He continues to have decreased appetite. He has frequent belching. He has occasional right upper chest and back pains and sometimes wakes up at night with "indigestion ".  No melena. No dysphagia.  Has tried anti-gas medications over-the-counter without much improvement  Past Medical History  Diagnosis Date  . Hypertension   . Hyperlipidemia   . Diabetes mellitus   . Arthritis   . Hx of abdominal aortic aneurysm 1998  . Personal history of colonic polyps - adenomas 09/09/2013   Past Surgical History  Procedure Laterality Date  . Abdominal aortic aneurysm repair  1998  . Umbilical hernia repair  1965    reports that he quit smoking about 28 years ago. His smoking use included Cigarettes. He has a 30 pack-year smoking history. He has never used smokeless tobacco. He reports that he drinks about 4.2 oz of alcohol per week. He  reports that he does not use illicit drugs. family history is negative for Colon cancer. No Known Allergies     Review of Systems  Constitutional: Positive for appetite change. Negative for fever and chills.  HENT: Negative for trouble swallowing.   Respiratory: Negative for cough and shortness of breath.   Cardiovascular: Negative for chest pain.  Gastrointestinal: Negative for nausea, vomiting, diarrhea, constipation, blood in stool and abdominal distention.  Genitourinary: Negative for dysuria.  Neurological: Negative for dizziness.       Objective:   Physical Exam  Constitutional: He appears well-developed and well-nourished. No distress.  HENT:  Mouth/Throat: Oropharynx is clear and moist.  Cardiovascular: Normal rate and regular rhythm.   Pulmonary/Chest: Effort normal and breath sounds normal. No respiratory distress. He has no wheezes. He has no rales.  Abdominal: Soft. Bowel sounds are normal. He exhibits no distension and no mass. There is no tenderness. There is no rebound and no guarding.          Assessment & Plan:  Patient presents with persistent loss of appetite, dyspepsia, frequent belching. Not improved with Protonix. He has had some weight loss but this has been very mild with about 3 pounds since February. Recent colonoscopy unremarkable. Check further labs with CBC, comp metabolic panel, lipase. Set up abdominal ultrasound. If all unrevealing and symptoms persist consider GI referral  Type 2 diabetes. History of good control. Recheck A1c

## 2014-12-08 NOTE — Progress Notes (Signed)
Pre visit review using our clinic review tool, if applicable. No additional management support is needed unless otherwise documented below in the visit note. 

## 2014-12-09 ENCOUNTER — Ambulatory Visit
Admission: RE | Admit: 2014-12-09 | Discharge: 2014-12-09 | Disposition: A | Discharge status: Home / Self Care | Payer: Medicare HMO | Source: Ambulatory Visit | Attending: Family Medicine | Admitting: Family Medicine

## 2014-12-09 DIAGNOSIS — R142 Eructation: Secondary | ICD-10-CM

## 2014-12-09 DIAGNOSIS — R1013 Epigastric pain: Secondary | ICD-10-CM

## 2014-12-09 DIAGNOSIS — R630 Anorexia: Secondary | ICD-10-CM

## 2014-12-09 IMAGING — US US ABDOMEN COMPLETE
1 series · 14 of 25 positions shown · non-contrast
Comparison: None.

CLINICAL DATA: Weight loss and loss of appetite

EXAM:
ULTRASOUND ABDOMEN COMPLETE

[Series 1: us abdomen complete · 0.27mm/px · 14 of 89 slices shown]
[im 1/89]
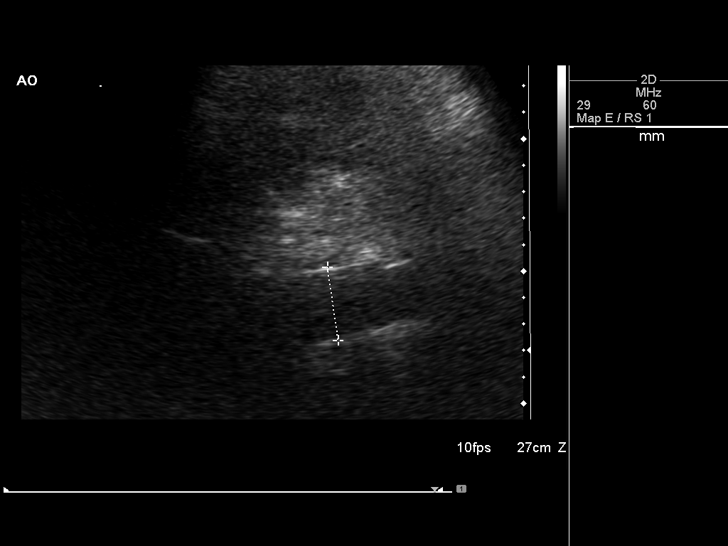
[im 8/89]
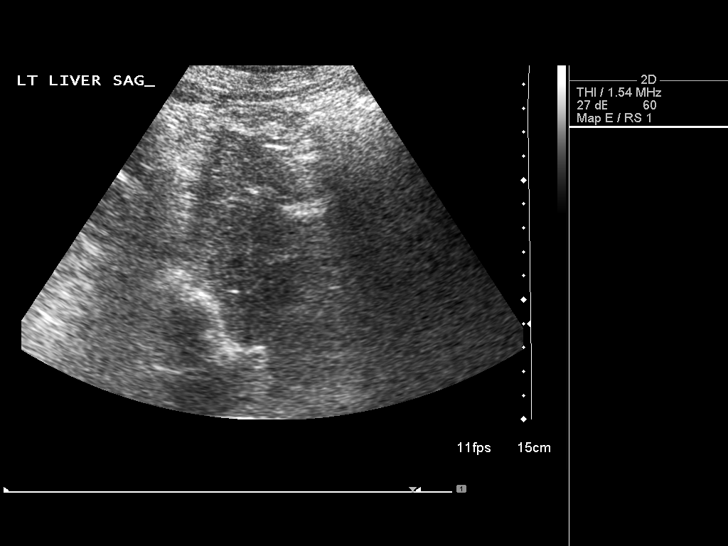
[im 15/89]
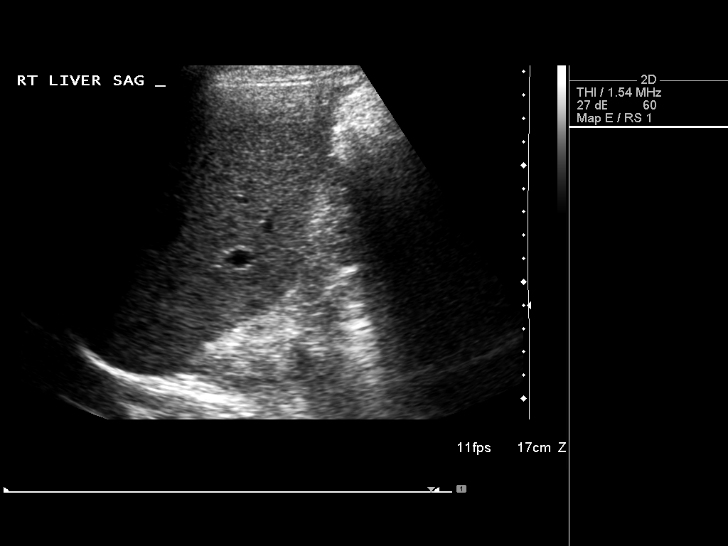
[im 23/89]
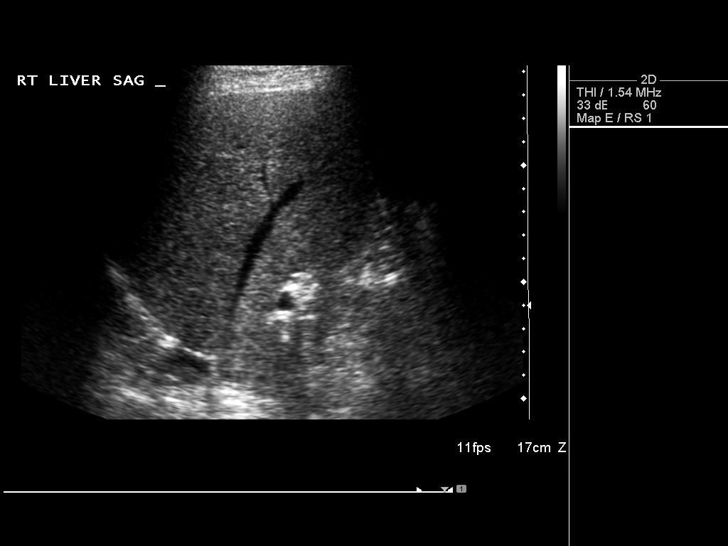
[im 30/89]
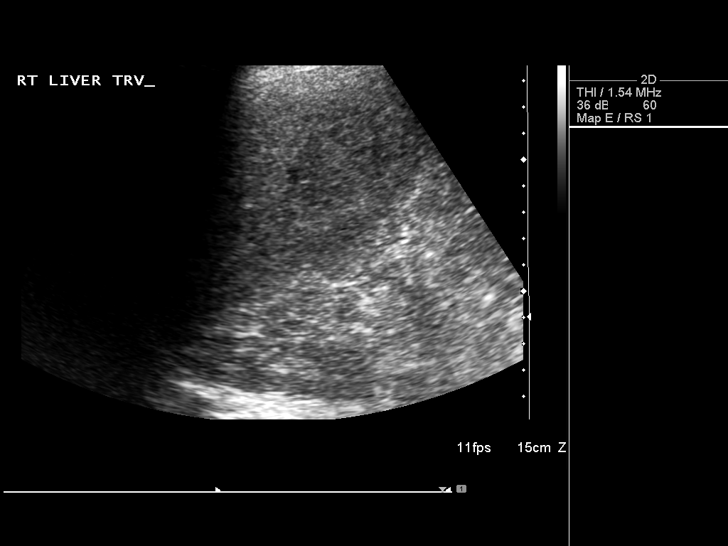
[im 34/89]
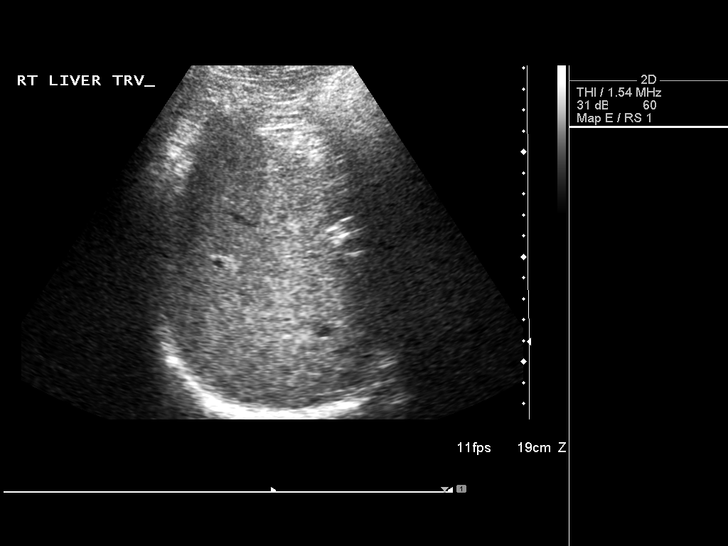
[im 41/89]
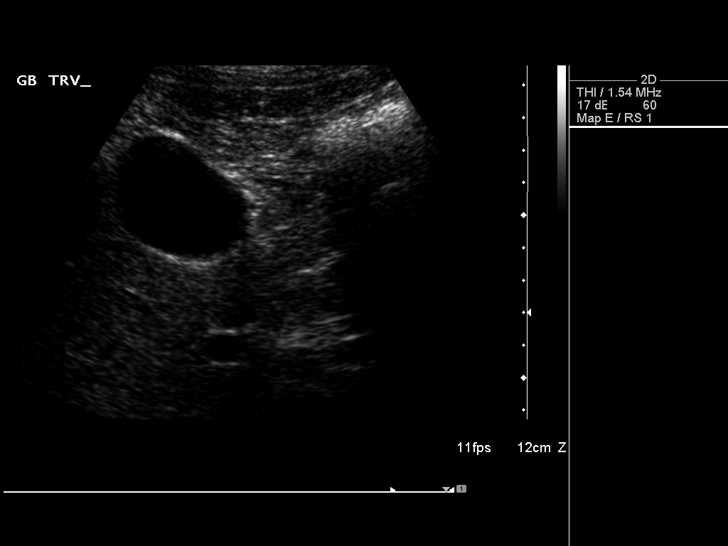
[im 48/89]
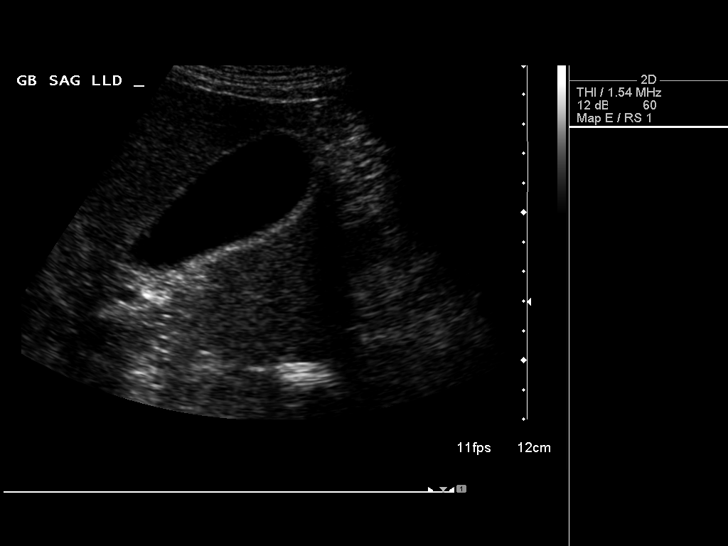
[im 56/89]
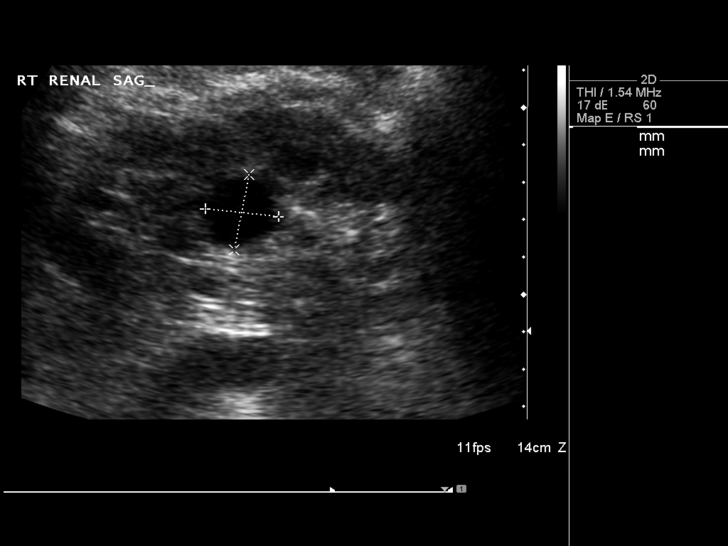
[im 59/89]
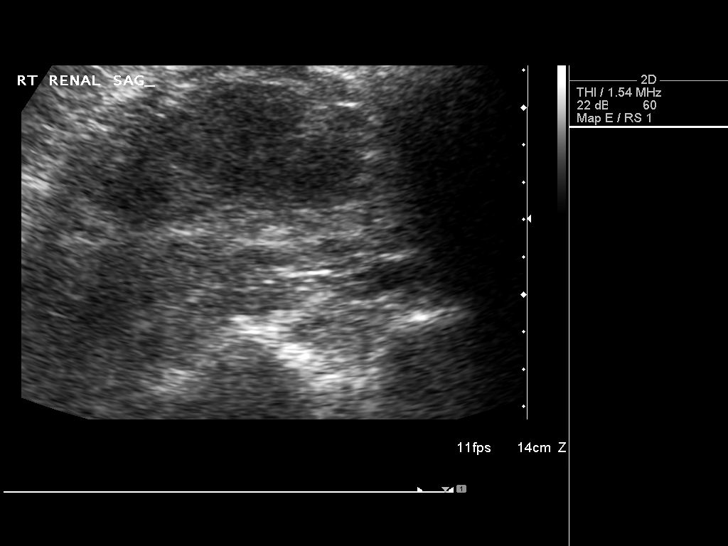
[im 67/89]
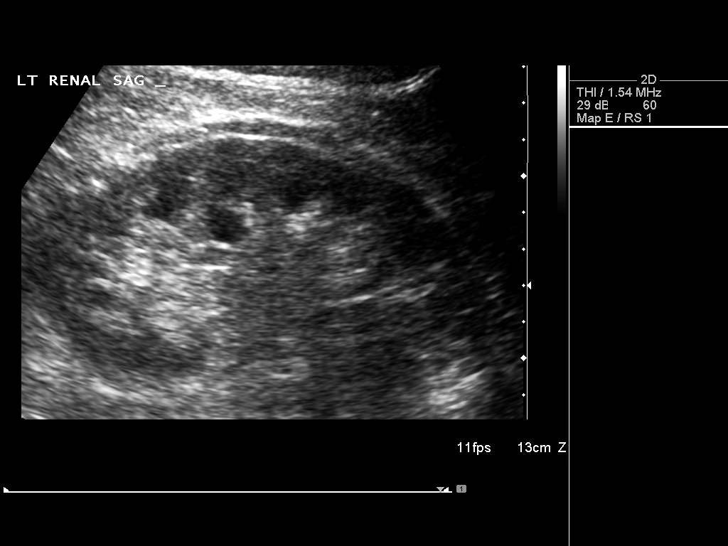
[im 74/89]
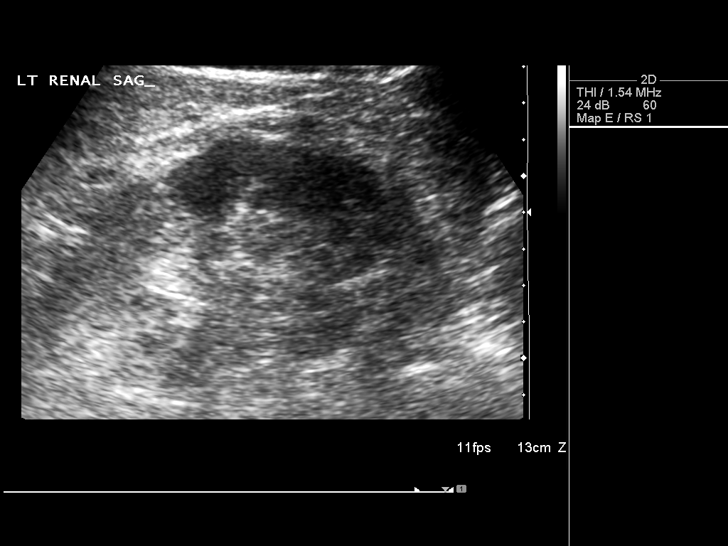
[im 81/89]
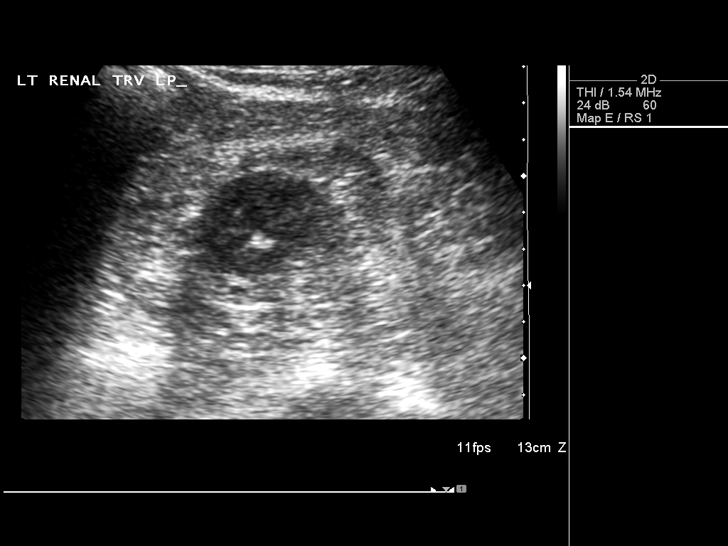
[im 89/89]
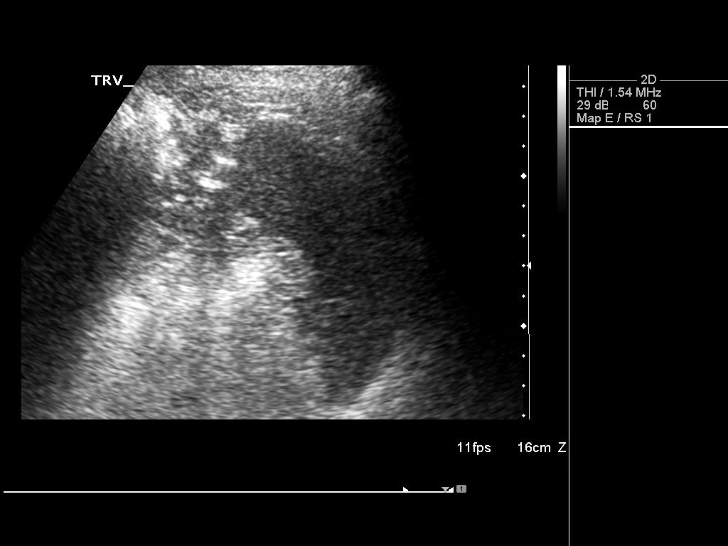

[14 of 25 positions shown; findings below may reference images not displayed]

FINDINGS: Gallbladder: No gallstones or wall thickening visualized. No
sonographic Murphy sign noted.

Common bile duct: Diameter: 3.7 mm.

Liver: No focal lesion identified. Within normal limits in
parenchymal echogenicity.

IVC: Not well visualized

Pancreas: Not well visualized due to overlying bowel gas.

Spleen: Size and appearance within normal limits.

Right Kidney: Length: 12 cm.. A 2.3 cm cyst is noted in the
midportion of the right renal pelvis.

Left Kidney: Length: 11.9 cm.. A 1.4 cm peripelvic cyst is noted on
the left.

Abdominal aorta: Mildly ectatic at 2.8 cm with normal distal
tapering

Other findings: None.
IMPRESSION: Bilateral renal cysts.

Mild aortic ectasia.

No other focal abnormality is noted.

## 2014-12-22 ENCOUNTER — Other Ambulatory Visit: Payer: Self-pay | Admitting: Family Medicine

## 2014-12-23 ENCOUNTER — Ambulatory Visit: Payer: Medicare HMO | Admitting: Family Medicine

## 2014-12-27 ENCOUNTER — Telehealth: Payer: Self-pay | Admitting: Internal Medicine

## 2014-12-27 NOTE — Telephone Encounter (Signed)
I have rescheduled the patient with his wife to 01/13/15 9:15

## 2015-01-13 ENCOUNTER — Telehealth: Payer: Self-pay

## 2015-01-13 ENCOUNTER — Ambulatory Visit (INDEPENDENT_AMBULATORY_CARE_PROVIDER_SITE_OTHER): Payer: Medicare HMO | Admitting: Internal Medicine

## 2015-01-13 ENCOUNTER — Encounter: Payer: Self-pay | Admitting: Internal Medicine

## 2015-01-13 VITALS — BP 128/80 | HR 68 | Ht 68.0 in | Wt 172.0 lb

## 2015-01-13 DIAGNOSIS — R1013 Epigastric pain: Secondary | ICD-10-CM

## 2015-01-13 DIAGNOSIS — R12 Heartburn: Secondary | ICD-10-CM

## 2015-01-13 MED ORDER — RANITIDINE HCL 300 MG PO TABS: 300.0000 mg | ORAL_TABLET | Freq: Every day | ORAL | Status: DC

## 2015-01-13 MED ORDER — OMEPRAZOLE 40 MG PO CPDR: 40.0000 mg | DELAYED_RELEASE_CAPSULE | Freq: Every day | ORAL | Status: DC

## 2015-01-13 NOTE — Telephone Encounter (Signed)
-----   Message from Gatha Mayer, MD sent at 01/13/2015  3:43 PM EDT ----- Regarding: needs EGD This is the patient that needs an EGD next week  Dx epigastric pain   I talked to Ulice Dash about him  Note not finished but will be  thanks

## 2015-01-13 NOTE — Patient Instructions (Addendum)
   We will be back in touch about doing an EGD next week once Dr Carlean Purl talks to his partners to see who will be able to do this.    We have sent the following medications to your pharmacy for you to pick up at your convenience: Omeprazole and generic zantac   Purchase over the counter Gaviscon and take as needed along with your other GERD medicines.    I appreciate the opportunity to care for you. Silvano Rusk, MD, [facility]   Pt is set up to have EGD 01/18/15, will instruct prior to procedure.

## 2015-01-13 NOTE — Telephone Encounter (Signed)
Patient notified of appt for 01/18/15 10:30 with 9:30 arrival.   Patient notified that PJ will meet him to get him his instructions.

## 2015-01-13 NOTE — Progress Notes (Signed)
   Subjective:    Patient ID: Miguel Vazquez, male    DOB: 12/08/1940, 74 y.o.   MRN: [id number] Cc: epigastric pain HPI  Elderly man known to me from screening colonoscopy 2015 - diverticulosis and polyps - adenomas. In past few months he developed pc epigastric pain, belching, heartburn. Some anorexia. Says afraid to eat because of subsequent pain. Bad at night also and keeps him up. HOB elevated. Saw PCP Dr Elease Hashimoto 8 and 9//2016and Rx pantoprazole but not effective Tried Prilosec and ranitidien and somewhat better  Wt Readings from Last 3 Encounters:  01/13/15 172 lb (78.019 kg)  12/08/14 174 lb (78.926 kg)  11/22/14 175 lb (79.379 kg)  05/2013 181# 10/2012 179# Medications, allergies, past medical history, past surgical history, family history and social history are reviewed and updated in the EMR. Past Medical History  Diagnosis Date  . Hypertension   . Hyperlipidemia   . Diabetes mellitus   . Arthritis   . Hx of abdominal aortic aneurysm 1998  . Personal history of colonic polyps - adenomas 09/09/2013   Past Surgical History  Procedure Laterality Date  . Abdominal aortic aneurysm repair  1998  . Umbilical hernia repair  1965  . Colonoscopy       Review of Systems No CP, dyspnea    Objective:   Physical Exam  @BP  128/80 mmHg  Pulse 68  Ht 5\' 8"  (1.727 m)  Wt 172 lb (78.019 kg)  BMI 26.16 kg/m2@  General:  NAD Eyes:   anicteric Lungs:  clear Heart:: S1S2 no rubs, + soft SEM LUSB Abdomen:  soft and nontender, BS+, faint epigastric bruit Ext:   no edema, cyanosis or clubbing    Data Reviewed:  PCP notes 8 and 12/2014 Labs 12/2014 NL CBC, CMET ok/NL except BUN 34 and glucose 165 non fast  [place] abd 12/09/2014  IMPRESSION: Bilateral renal cysts.  Mild aortic ectasia.  No other focal abnormality is noted.      Assessment & Plan:  Abdominal pain, epigastric - Plan: omeprazole (PRILOSEC) 40 MG capsule, ranitidine (ZANTAC) 300 MG tablet  Heartburn - Plan:  omeprazole (PRILOSEC) 40 MG capsule, ranitidine (ZANTAC) 300 MG tablet  Mild weight loss   Needs an EGD but will see if can be done in my absence - Dr. Hilarie Fredrickson has kindly agreed to do this. The risks and benefits as well as alternatives of endoscopic procedure(s) have been discussed and reviewed. All questions answered. The patient agrees to proceed.   Try omeprazole 40 mg AM + ranitidine 300 mg hx and prn gaviscon. Sounds most like reflux to me. Has some mild sitophobia - slight epigastric bruit vs a transmitted murmur - has had AAA repair 1998 - his sxs do not sound like ischemia but keep in mind given hx and bruit.  I appreciate the opportunity to care for this patient. SK:[id number],IOMBT W, MD

## 2015-01-15 ENCOUNTER — Encounter: Payer: Self-pay | Admitting: Internal Medicine

## 2015-01-18 ENCOUNTER — Ambulatory Visit (AMBULATORY_SURGERY_CENTER): Payer: Medicare HMO | Admitting: Internal Medicine

## 2015-01-18 ENCOUNTER — Encounter: Payer: Self-pay | Admitting: Internal Medicine

## 2015-01-18 VITALS — BP 111/58 | HR 65 | Temp 96.5°F | Resp 50 | Ht 68.0 in | Wt 172.0 lb

## 2015-01-18 DIAGNOSIS — K219 Gastro-esophageal reflux disease without esophagitis: Secondary | ICD-10-CM | POA: Diagnosis not present

## 2015-01-18 DIAGNOSIS — R1013 Epigastric pain: Secondary | ICD-10-CM | POA: Diagnosis not present

## 2015-01-18 DIAGNOSIS — K3189 Other diseases of stomach and duodenum: Secondary | ICD-10-CM | POA: Diagnosis not present

## 2015-01-18 LAB — GLUCOSE, CAPILLARY
GLUCOSE-CAPILLARY: 110 mg/dL — AB (ref 65–99)
Glucose-Capillary: 117 mg/dL — ABNORMAL HIGH (ref 65–99)

## 2015-01-18 MED ORDER — SODIUM CHLORIDE 0.9 % IV SOLN: 500.0000 mL | INTRAVENOUS | Status: DC

## 2015-01-18 NOTE — Progress Notes (Signed)
Called to room to assist during endoscopic procedure.  Patient ID and intended procedure confirmed with present staff. Received instructions for my participation in the procedure from the performing physician.  

## 2015-01-18 NOTE — Patient Instructions (Signed)
YOU HAD AN ENDOSCOPIC PROCEDURE TODAY AT [facility]:   Refer to the procedure report that was given to you for any specific questions about what was found during the examination.  If the procedure report does not answer your questions, please call your gastroenterologist to clarify.  If you requested that your care partner not be given the details of your procedure findings, then the procedure report has been included in a sealed envelope for you to review at your convenience later.  YOU SHOULD EXPECT: Some feelings of bloating in the abdomen. Passage of more gas than usual.  Walking can help get rid of the air that was put into your GI tract during the procedure and reduce the bloating. If you had a lower endoscopy (such as a colonoscopy or flexible sigmoidoscopy) you may notice spotting of blood in your stool or on the toilet paper. If you underwent a bowel prep for your procedure, you may not have a normal bowel movement for a few days.  Please Note:  You might notice some irritation and congestion in your nose or some drainage.  This is from the oxygen used during your procedure.  There is no need for concern and it should clear up in a day or so.  SYMPTOMS TO REPORT IMMEDIATELY:   Following upper endoscopy (EGD)  Vomiting of blood or coffee ground material  New chest pain or pain under the shoulder blades  Painful or persistently difficult swallowing  New shortness of breath  Fever of 100F or higher  Black, tarry-looking stools  For urgent or emergent issues, a gastroenterologist can be reached at any hour by calling [phone].   DIET: Your first meal following the procedure should be a small meal and then it is ok to progress to your normal diet. Heavy or fried foods are harder to digest and may make you feel nauseous or bloated.  Likewise, meals heavy in dairy and vegetables can increase bloating.  Drink plenty of fluids but you should avoid alcoholic beverages for  24 hours.  ACTIVITY:  You should plan to take it easy for the rest of today and you should NOT DRIVE or use heavy machinery until tomorrow (because of the sedation medicines used during the test).    FOLLOW UP: Our staff will call the number listed on your records the next business day following your procedure to check on you and address any questions or concerns that you may have regarding the information given to you following your procedure. If we do not reach you, we will leave a message.  However, if you are feeling well and you are not experiencing any problems, there is no need to return our call.  We will assume that you have returned to your regular daily activities without incident.  If any biopsies were taken you will be contacted by phone or by letter within the next 1-3 weeks.  Please call us at [phone] if you have not heard about the biopsies in 3 weeks.    SIGNATURES/CONFIDENTIALITY: You and/or your care partner have signed paperwork which will be entered into your electronic medical record.  These signatures attest to the fact that that the information above on your After Visit Summary has been reviewed and is understood.  Full responsibility of the confidentiality of this discharge information lies with you and/or your care-partner.  Continue omeprazole each morning and ranitidine at bedtime. Follow up with Dr. Carlean Purl. Biopsy results pending.

## 2015-01-18 NOTE — Progress Notes (Signed)
To recovery, report to Myers, RN, VSS. 

## 2015-01-18 NOTE — Progress Notes (Signed)
Dental advisory given to patient 

## 2015-01-18 NOTE — Op Note (Signed)
[place]  [address]. [place], [id number]   ENDOSCOPY PROCEDURE REPORT  PATIENT: Miguel, Vazquez  MR#: [id number] BIRTHDATE: December 19, 1940 , 73  yrs. old GENDER: male ENDOSCOPIST: Jerene Bears, MD REFERRED BY:  Gatha Mayer, M.D, [facility] PROCEDURE DATE:  01/18/2015 PROCEDURE:  EGD, diagnostic and EGD w/ biopsy ASA CLASS:     Class III INDICATIONS:  epigastric pain and heartburn. MEDICATIONS: Monitored anesthesia care and Propofol 160 mg IV TOPICAL ANESTHETIC: none  DESCRIPTION OF PROCEDURE: After the risks benefits and alternatives of the procedure were thoroughly explained, informed consent was obtained.  The LB [id number] endoscope was introduced through the mouth and advanced to the second portion of the duodenum , Without limitations.  The instrument was slowly withdrawn as the mucosa was fully examined.   ESOPHAGUS: The mucosa of the esophagus appeared normal.   Z-line is regular at 40 cm.  STOMACH: The mucosa of the stomach appeared normal.  Cold forcep biopsies were taken at the gastric body, antrum and angularis to evaluate for h.  pylori.  DUODENUM: The duodenal mucosa showed no abnormalities in the bulb and 2nd part of the duodenum. Retroflexed views revealed no abnormalities.     The scope was then withdrawn from the patient and the procedure completed.  COMPLICATIONS: There were no immediate complications.  ENDOSCOPIC IMPRESSION: 1.   The mucosa of the esophagus appeared normal 2.   The mucosa of the stomach appeared normal; multiple biopsies 3.   The duodenal mucosa showed no abnormalities in the bulb and 2nd part of the duodenum  RECOMMENDATIONS: 1.  Await biopsy results 2.  Continue current regimen of omeprazole each morning and ranitidine at bedtime.  As needed Gaviscon per bottle instruction 3.  Follow-up with Dr.  Carlean Purl  eSigned:  Jerene Bears, MD 01/18/2015 10:40 AM  DJ:[id number] Elease Hashimoto, MD, Silvano Rusk, MD, and The  Patient

## 2015-01-19 ENCOUNTER — Telehealth: Payer: Self-pay

## 2015-01-19 NOTE — Telephone Encounter (Signed)
  Follow up Call-  Call back number 01/18/2015 09/02/2013  Post procedure Call Back phone  # [phone]  Permission to leave phone message Yes Yes     Patient questions:  Do you have a fever, pain , or abdominal swelling? No. Pain Score  0 *  Have you tolerated food without any problems? Yes.    Have you been able to return to your normal activities? Yes.    Do you have any questions about your discharge instructions: Diet   No. Medications  No. Follow up visit  No.  Do you have questions or concerns about your Care? No.  Actions: * If pain score is 4 or above: No action needed, pain <4.  No problems per the. [facility]

## 2015-01-25 ENCOUNTER — Encounter: Payer: Self-pay | Admitting: Internal Medicine

## 2015-01-30 NOTE — Progress Notes (Signed)
Quick Note:  The endoscopy exam did not show anything bad - some stomach lining irritation that is very common. How is he? ______

## 2015-02-02 ENCOUNTER — Telehealth: Payer: Self-pay

## 2015-02-02 DIAGNOSIS — R1013 Epigastric pain: Secondary | ICD-10-CM

## 2015-02-02 DIAGNOSIS — R634 Abnormal weight loss: Secondary | ICD-10-CM

## 2015-02-02 NOTE — Telephone Encounter (Signed)
-----   Message from Gatha Mayer, MD sent at 02/02/2015  1:10 PM EDT ----- Regarding: next step Agree w/ Ct abd/pelvis w/ contrast - epigastric pain and weight loss ----- Message -----    From: Nate Perri E [place], CMA    Sent: 02/01/2015   6:55 PM      To: Gatha Mayer, MD  Patient still having problems, daughter Marita [place] # [phone] wonders if we could order a HIDA scan or CT.  His weight is down another 3 lbs. Please advise.

## 2015-02-02 NOTE — Telephone Encounter (Signed)
Spoke with wife [name] and informed her of CT appt date/time and they will come for labs tomorrow and pick up CT instructions.

## 2015-02-03 ENCOUNTER — Other Ambulatory Visit (INDEPENDENT_AMBULATORY_CARE_PROVIDER_SITE_OTHER): Payer: Medicare HMO

## 2015-02-03 DIAGNOSIS — R634 Abnormal weight loss: Secondary | ICD-10-CM

## 2015-02-03 DIAGNOSIS — R1013 Epigastric pain: Secondary | ICD-10-CM

## 2015-02-03 LAB — BASIC METABOLIC PANEL
BUN: 24 mg/dL — AB (ref 6–23)
CHLORIDE: 100 meq/L (ref 96–112)
CO2: 32 meq/L (ref 19–32)
CREATININE: 1.19 mg/dL (ref 0.40–1.50)
Calcium: 10.5 mg/dL (ref 8.4–10.5)
GFR: 63.69 mL/min (ref >60.00)
Glucose, Bld: 127 mg/dL — ABNORMAL HIGH (ref 70–99)
POTASSIUM: 4.6 meq/L (ref 3.5–5.1)
Sodium: 140 mEq/L (ref 135–145)

## 2015-02-04 ENCOUNTER — Ambulatory Visit (INDEPENDENT_AMBULATORY_CARE_PROVIDER_SITE_OTHER)
Admission: RE | Admit: 2015-02-04 | Discharge: 2015-02-04 | Disposition: A | Discharge status: Home / Self Care | Payer: Medicare HMO | Source: Ambulatory Visit | Attending: Internal Medicine | Admitting: Internal Medicine

## 2015-02-04 DIAGNOSIS — R1013 Epigastric pain: Secondary | ICD-10-CM | POA: Diagnosis not present

## 2015-02-04 DIAGNOSIS — R634 Abnormal weight loss: Secondary | ICD-10-CM

## 2015-02-04 IMAGING — CT CT ABD-PELV W/ CM
2 of 8 series · 14 of 46 positions shown, 18 images · IV contrast (Omnipaque 300)
Comparison: None.

CLINICAL DATA: Epigastric abdominal pain, anorexia, and weight loss
for 3 weeks.

EXAM:
CT ABDOMEN AND PELVIS WITH CONTRAST
TECHNIQUE: Multidetector CT imaging of the abdomen and pelvis was performed
using the standard protocol following bolus administration of
intravenous contrast.
CONTRAST:  100mL OMNIPAQUE IOHEXOL 300 MG/ML  SOLN

[Series 2: abd/ pel 5mm · axial · 0.79mm/px · z∈[-487,-7]mm · 11 of 108 slices shown, 15 images]
[im 6/108  soft-tissue]
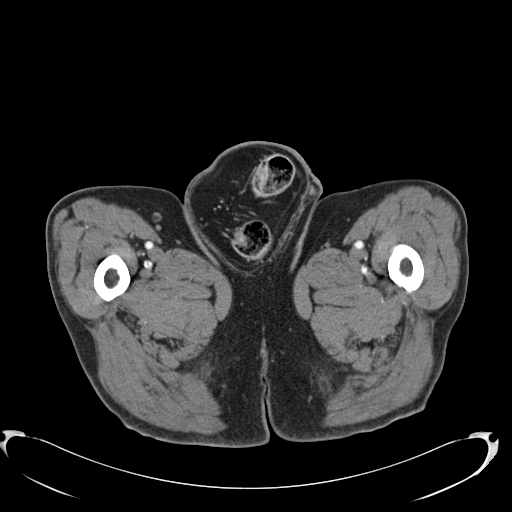
[im 6/108  bone]
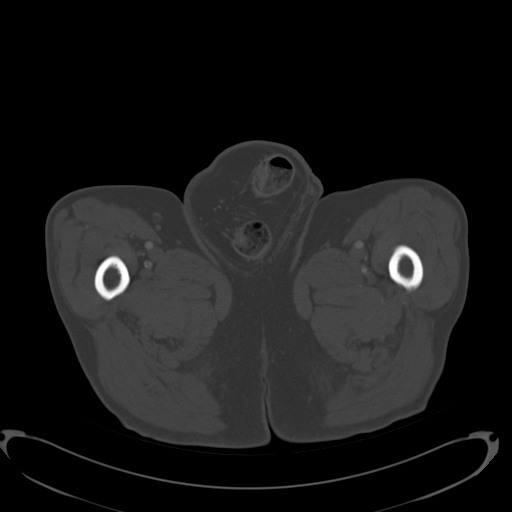
[im 18/108  soft-tissue]
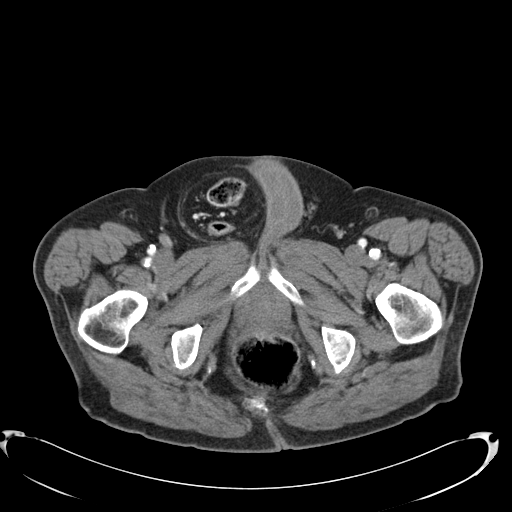
[im 30/108  soft-tissue]
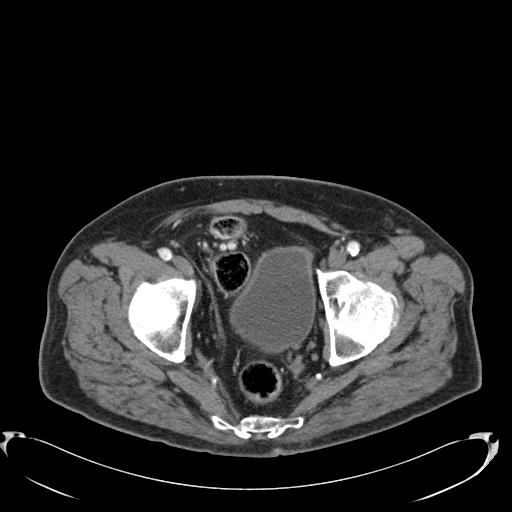
[im 42/108  soft-tissue]
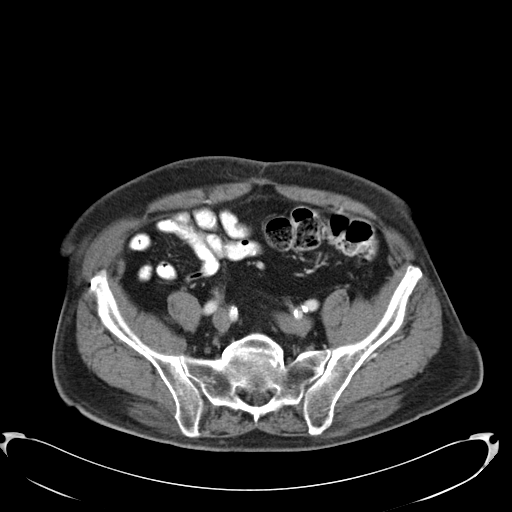
[im 54/108  soft-tissue]
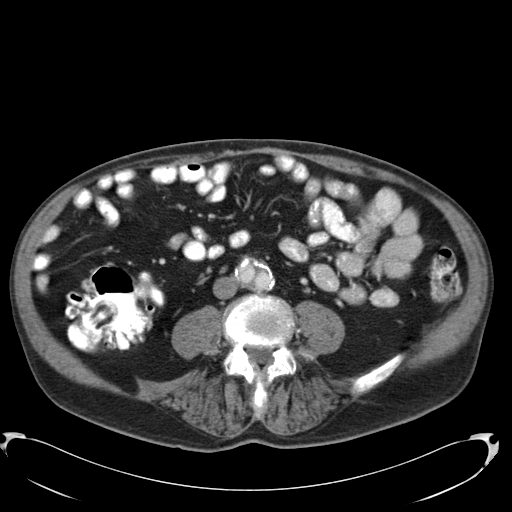
[im 66/108  soft-tissue]
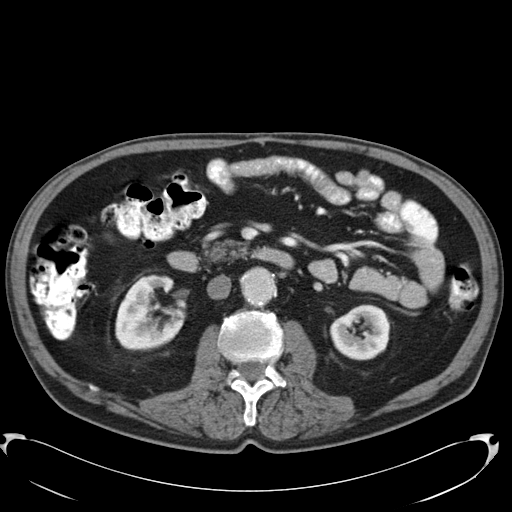
[im 78/108  soft-tissue]
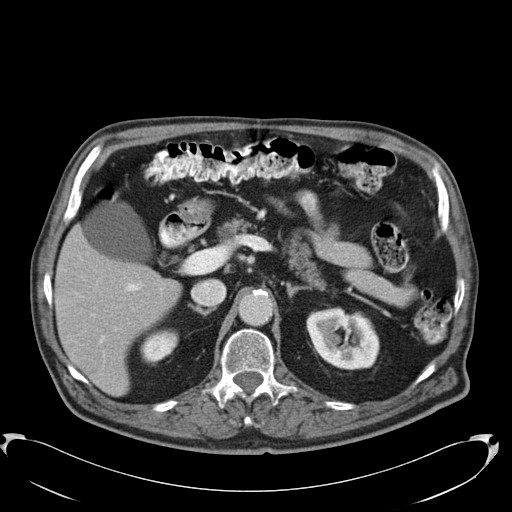
[im 84/108  lung]
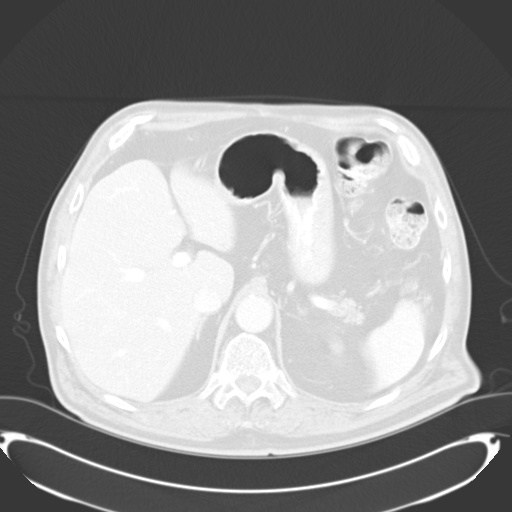
[im 90/108  soft-tissue]
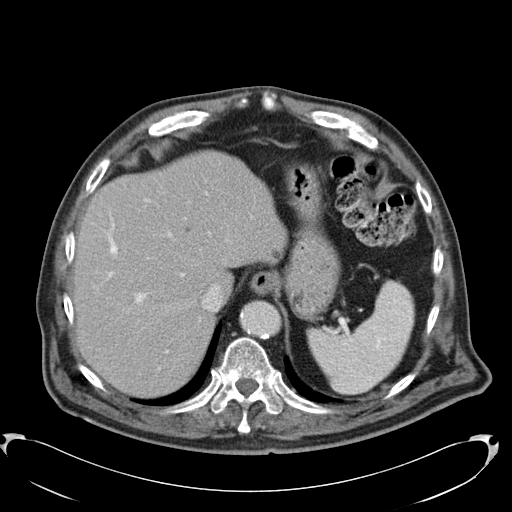
[im 90/108  lung]
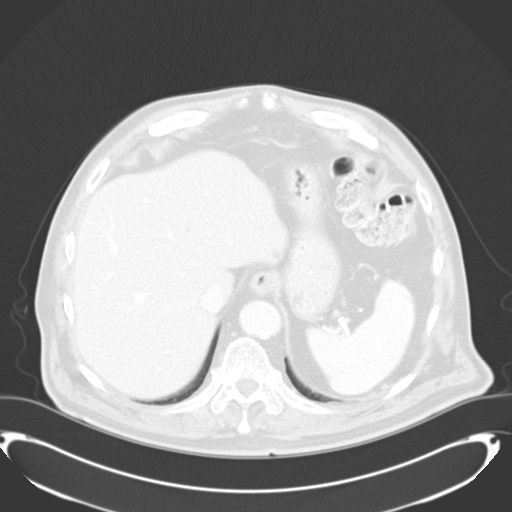
[im 96/108  lung]
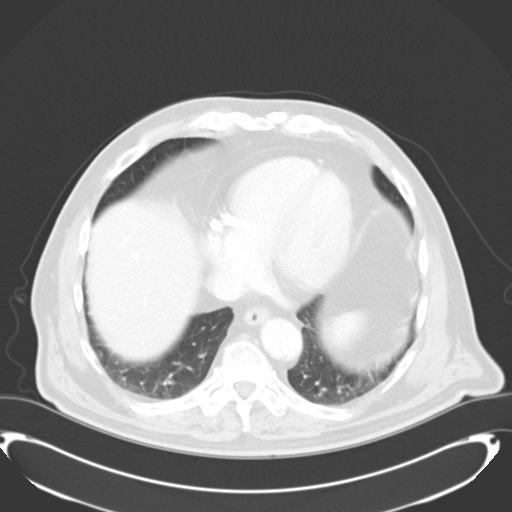
[im 102/108  soft-tissue]
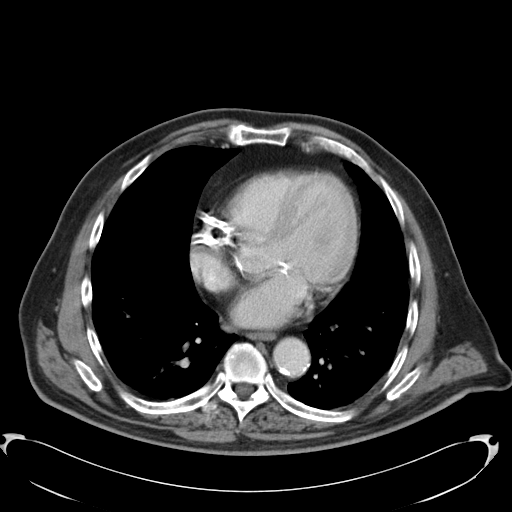
[im 102/108  lung]
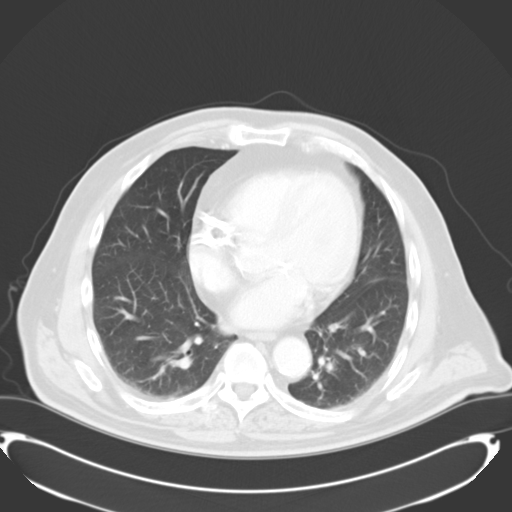
[im 102/108  bone]
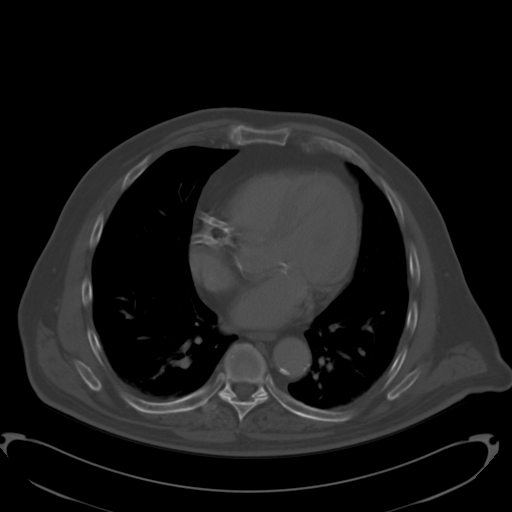

[Series 602: cor · coronal · 1.08mm/px · 3 of 125 slices shown]
[im 25/125  soft-tissue]
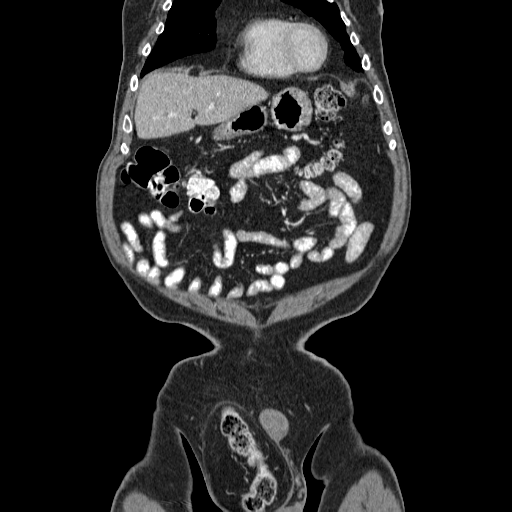
[im 50/125  soft-tissue]
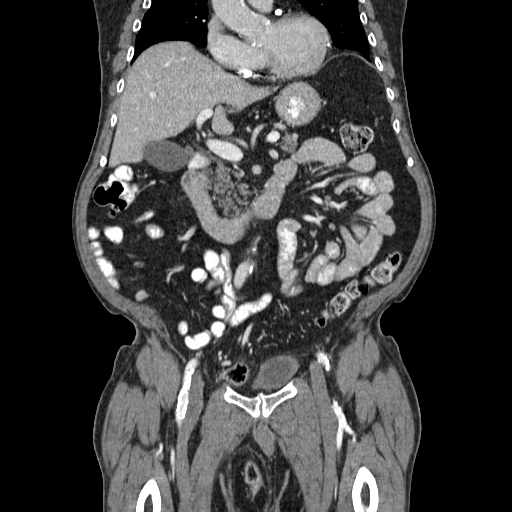
[im 75/125  soft-tissue]
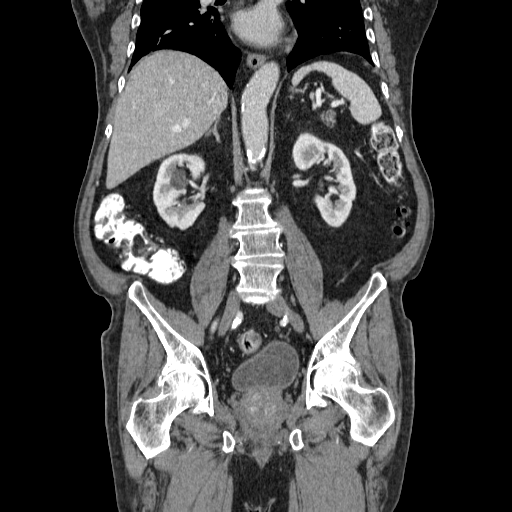

[14 of 46 positions shown; findings below may reference images not displayed]

FINDINGS: Lower chest: Mild cardiomegaly and diffuse coronary artery
calcification noted. Visualized portions of lung bases are clear.

Hepatobiliary: Probable tiny sub-cm hepatic cysts noted. No liver
masses identified. Tiny calcified gallstones seen without evidence
of cholecystitis or biliary ductal dilatation.

Pancreas: No mass, inflammatory changes, or other significant
abnormality.

Spleen: Within normal limits in size and appearance.

Adrenals/Urinary Tract: No masses identified. No evidence of
hydronephrosis. Small renal cysts noted bilaterally as well as
bilateral nonobstructive renal calculi. Right lower pole renal
parenchymal scarring noted. No evidence of ureteral calculi or
dilatation. Urinary bladder is unremarkable.

Stomach/Bowel: No evidence of obstruction, inflammatory process, or
abnormal fluid collections. Colonic diverticulosis is noted, without
evidence of diverticulitis. A large right inguinal hernia is seen
containing sigmoid colon. No evidence of bowel obstruction or
ischemia. Normal appendix visualized.

Vascular/Lymphatic: No pathologically enlarged lymph nodes. Previous
abdominal aortic aneurysm repair noted.

Reproductive: Mildly enlarged prostate.

Other: None.

Musculoskeletal:  No suspicious bone lesions identified.
IMPRESSION: No acute findings within the abdomen or pelvis.

Large right inguinal hernia containing sigmoid colon.

Cholelithiasis.  No radiographic evidence of cholecystitis.

Colonic diverticulosis. No radiographic evidence of diverticulitis.

Nonobstructive bilateral renal calculi. No evidence of ureteral
calculi or hydronephrosis.

Mildly enlarged prostate.

## 2015-02-04 MED ORDER — IOHEXOL 300 MG/ML  SOLN
100.0000 mL | Freq: Once | INTRAMUSCULAR | Status: AC | PRN
  Administered 2015-02-04: 100 mL via INTRAVENOUS

## 2015-02-07 NOTE — Progress Notes (Signed)
Quick Note:  CT scan reveals a large inguinal hernia - not clear if that is source of sxs - I suppose it could be. If there is pressure on the colon in hernia sac ? If that could be transmitted or radiate upward into abdomen where he has sxs. He needs to see his surgeon - he has had an umbilical hernia repair so would have him see that MD - presumably at [place] ______

## 2015-02-09 ENCOUNTER — Other Ambulatory Visit: Payer: Self-pay | Admitting: Surgery

## 2015-02-09 ENCOUNTER — Ambulatory Visit: Payer: Medicare HMO | Admitting: Internal Medicine

## 2015-02-09 NOTE — H&P (Signed)
Miguel Vazquez 02/08/2015 11:39 AM Location: [facility] Patient #: [id number] DOB: 1940/09/08 Married / Language: Cleophus Molt / Race: White Male  History of Present Illness Adin Hector MD; 02/09/2015 8:44 AM) The patient is a 74 year old male who presents with an inguinal hernia. Patient sent by his gastroenterologist, Dr Silvano Rusk, for abdominal pain and inguinal hernia.  Pleasant active male. Comes today with his wife & their son who has special needs but high functioning. Patient has had a groin swelling for many years. Gradually gotten larger. Still some discomfort when he is active but nothing too severe. he's intentionally lost about 20 pounds with improved diet and increased exercise.  His main complaint has been intermittent LEFT upper quadrant abdominal pain. Usually after he eats he feels full and uncomfortable radiating on his LEFT side. He wondered it was heartburn or reflux. Was given pantoprazole by his primary care physician without much help. Normal ultrasound. Gastroenterology evaluation showed normal EGD. Colonoscopy underwhelming as well. Tried omeprazole and ranitidine without much help. Was taking some over-the-counter medication that helped him. Perhaps Gas-X? Had CT scan abd/pelvis done which showed RIGHT inguinal hernia with colon incarcerated within it. No definite obstruction or perforation. No palpable obstruction. No tumor or other abnormality in the abdomen. No obvious incisional hernia.   Other Problems Elbert Ewings, CMA; 02/08/2015 11:39 AM) Arthritis Diabetes Mellitus High blood pressure Hypercholesterolemia Inguinal Hernia  Past Surgical History Elbert Ewings, CMA; 02/08/2015 11:39 AM) Aneurysm Repair Colon Polyp Removal - Colonoscopy Colon Polyp Removal - Open Oral Surgery Ventral / Umbilical Hernia Surgery Bilateral.  Diagnostic Studies History Elbert Ewings, CMA; 02/08/2015 11:39 AM) Colonoscopy 1-5 years  ago  Allergies Elbert Ewings, CMA; 02/08/2015 11:39 AM) No Known Drug Allergies 02/08/2015  Medication History Elbert Ewings, CMA; 02/08/2015 11:41 AM) Lisinopril-Hydrochlorothiazide (20-12.5MG  Tablet, Oral) Active. MetFORMIN HCl (500MG  Tablet, Oral) Active. Atorvastatin Calcium (40MG  Tablet, Oral) Active. Omeprazole (40MG  Capsule DR, Oral) Active. Aspirin (81MG  Tablet, Oral) Active. Centrum Silver Ultra Mens (Oral) Active. Vitamin E (200UNIT Capsule, Oral) Active. Medications Reconciled  Social History Elbert Ewings, [place]; 02/08/2015 11:39 AM) Alcohol use Occasional alcohol use. Caffeine use Coffee. No drug use Tobacco use Former smoker.  Family History Elbert Ewings, [place]; 02/08/2015 11:39 AM) Arthritis Brother, Father, Sister. Cancer Mother. Heart disease in male family member before age 72 Heart disease in male family member before age 46 Hypertension Mother, Sister.     Review of Systems Elbert Ewings CMA; 02/08/2015 11:39 AM) General Present- Appetite Loss and Weight Loss. Not Present- Chills, Fatigue, Fever, Night Sweats and Weight Gain. Skin Not Present- Change in Wart/Mole, Dryness, Hives, Jaundice, New Lesions, Non-Healing Wounds, Rash and Ulcer. HEENT Present- Wears glasses/contact lenses. Not Present- Earache, Hearing Loss, Hoarseness, Nose Bleed, Oral Ulcers, Ringing in the Ears, Seasonal Allergies, Sinus Pain, Sore Throat, Visual Disturbances and Yellow Eyes. Respiratory Not Present- Bloody sputum, Chronic Cough, Difficulty Breathing, Snoring and Wheezing. Breast Not Present- Breast Mass, Breast Pain, Nipple Discharge and Skin Changes. Cardiovascular Not Present- Chest Pain, Difficulty Breathing Lying Down, Leg Cramps, Palpitations, Rapid Heart Rate, Shortness of Breath and Swelling of Extremities. Gastrointestinal Present- Abdominal Pain, Change in Bowel Habits, Excessive gas, Gets full quickly at meals, Indigestion and Nausea. Not Present- Bloating,  Bloody Stool, Chronic diarrhea, Constipation, Difficulty Swallowing, Hemorrhoids, Rectal Pain and Vomiting. Male Genitourinary Present- Impotence. Not Present- Blood in Urine, Change in Urinary Stream, Frequency, Nocturia, Painful Urination, Urgency and Urine Leakage. Neurological Not Present- Decreased Memory, Fainting, Headaches, Numbness, Seizures, Tingling, Tremor,  Trouble walking and Weakness. Psychiatric Not Present- Anxiety, Bipolar, Change in Sleep Pattern, Depression, Fearful and Frequent crying. Hematology Not Present- Easy Bruising, Excessive bleeding, Gland problems, HIV and Persistent Infections.  Vitals Elbert Ewings CMA; 02/08/2015 11:41 AM) 02/08/2015 11:41 AM Weight: 170.6 lb Height: 68in Body Surface Area: 1.91 m Body Mass Index: 25.94 kg/m  Temp.: 98.65F(Temporal)  Pulse: 60 (Regular)  BP: 130/72 (Sitting, Left Arm, Standard)      Physical Exam Adin Hector MD; 02/08/2015 12:43 PM)  General Mental Status-Alert. General Appearance-Not in acute distress, Not Sickly. Orientation-Oriented X3. Hydration-Well hydrated. Voice-Normal. Note: Very pleasant. Recall fair  Integumentary Global Assessment Upon inspection and palpation of skin surfaces of the - Axillae: non-tender, no inflammation or ulceration, no drainage. and Distribution of scalp and body hair is normal. General Characteristics Temperature - normal warmth is noted.  Head and Neck Head-normocephalic, atraumatic with no lesions or palpable masses. Face Global Assessment - atraumatic, no absence of expression. Neck Global Assessment - no abnormal movements, no bruit auscultated on the right, no bruit auscultated on the left, no decreased range of motion, non-tender. Trachea-midline. Thyroid Gland Characteristics - non-tender.  Eye Eyeball - Left-Extraocular movements intact, No Nystagmus. Eyeball - Right-Extraocular movements intact, No Nystagmus. Cornea - Left-No  Hazy. Cornea - Right-No Hazy. Sclera/Conjunctiva - Left-No scleral icterus, No Discharge. Sclera/Conjunctiva - Right-No scleral icterus, No Discharge. Pupil - Left-Direct reaction to light normal. Pupil - Right-Direct reaction to light normal.  ENMT Ears Pinna - Left - no drainage observed, no generalized tenderness observed. Right - no drainage observed, no generalized tenderness observed. Nose and Sinuses External Inspection of the Nose - no destructive lesion observed. Inspection of the nares - Left - quiet respiration. Right - quiet respiration. Mouth and Throat Lips - Upper Lip - no fissures observed, no pallor noted. Lower Lip - no fissures observed, no pallor noted. Nasopharynx - no discharge present. Oral Cavity/Oropharynx - Tongue - no dryness observed. Oral Mucosa - no cyanosis observed. Hypopharynx - no evidence of airway distress observed.  Chest and Lung Exam Inspection Movements - Normal and Symmetrical. Accessory muscles - No use of accessory muscles in breathing. Palpation Palpation of the chest reveals - Non-tender. Auscultation Breath sounds - Normal and Clear.  Cardiovascular Auscultation Rhythm - Regular. Murmurs & Other Heart Sounds - Auscultation of the heart reveals - No Murmurs and No Systolic Clicks.  Abdomen Inspection Inspection of the abdomen reveals - No Visible peristalsis and No Abnormal pulsations. Umbilicus - No Bleeding, No Urine drainage. Palpation/Percussion Palpation and Percussion of the abdomen reveal - Soft, Non Tender, No Rebound tenderness, No Rigidity (guarding) and No Cutaneous hyperesthesia. Note: Soft and flat. Moderate diastases recti. Perhaps small umbilical incisional hernia at best. Can feel permanent stitches since his is a thin abdominal wall. No pain. No guarding. No Murphy sign.  Male Genitourinary Sexual Maturity Tanner 5 - Adult hair pattern and Adult penile size and shape. Note: Uncircumcised male. Very large  RIGHT scrotal hernia. Feel bowel within it. Is soft. Not reducible. LEFT groin incision suspicious for prior open LEFT inguinal hernia repair. Mild laxity external ring but no definite hernia there.  Peripheral Vascular Upper Extremity Inspection - Left - No Cyanotic nailbeds, Not Ischemic. Right - No Cyanotic nailbeds, Not Ischemic.  Neurologic Neurologic evaluation reveals -normal attention span and ability to concentrate, able to name objects and repeat phrases. Appropriate fund of knowledge , normal sensation and normal coordination. Mental Status Affect - not angry, not paranoid. Cranial Nerves-Normal  Bilaterally. Gait-Normal.  Neuropsychiatric Mental status exam performed with findings of-able to articulate well with normal speech/language, rate, volume and coherence, thought content normal with ability to perform basic computations and apply abstract reasoning and no evidence of hallucinations, delusions, obsessions or homicidal/suicidal ideation.  Musculoskeletal Global Assessment Spine, Ribs and Pelvis - no instability, subluxation or laxity. Right Upper Extremity - no instability, subluxation or laxity.  Lymphatic Head & Neck  General Head & Neck Lymphatics: Bilateral - Description - No Localized lymphadenopathy. Axillary  General Axillary Region: Bilateral - Description - No Localized lymphadenopathy. Femoral & Inguinal  Generalized Femoral & Inguinal Lymphatics: Left - Description - No Localized lymphadenopathy. Right - Description - No Localized lymphadenopathy.    Assessment & Plan Adin Hector MD; 02/09/2015 8:43 AM)  Carolynn Serve HERNIA (K40.90) Impression: Large RIGHT inguinal scrotal hernia with sigmoid colon within it. Incarcerated and not reducible.  I think this would benefit from surgical repair given the fact he gets pain with activity and has symptoms that make me suspect that he is getting partial colonic obstruction radiating to his LEFT side  since its been refractory to antacid medicines & GI workup otherwise underwhelming  I think it is reasonable start out laparoscopically. Try TEP approach although with prior aneurysm repair might need to be a TAPP. Might need to convert to open. However risks less and repair better if can do electively. The patient especially his wife are interested in proceeding. Would have a low threshold to fix the contralateral LEFT side for reinforcement as well since he has some laxity.  Current Plans You are being scheduled for surgery - Our schedulers will call you.  You should hear from our office's scheduling department within 5 working days about the location, date, and time of surgery. We try to make accommodations for patient's preferences in scheduling surgery, but sometimes the OR schedule or the surgeon's schedule prevents [place] from making those accommodations.  If you have not heard from our office [phone]) in 5 working days, call the office and ask for your surgeon's nurse.  If you have other questions about your diagnosis, plan, or surgery, call the office and ask for your surgeon's nurse.  Written instructions provided Pt Education - Pamphlet Given - Laparoscopic Hernia Repair: discussed with patient and provided information. The anatomy & physiology of the abdominal wall and pelvic floor was discussed. The pathophysiology of hernias in the inguinal and pelvic region was discussed. Natural history risks such as progressive enlargement, pain, incarceration, and strangulation was discussed. Contributors to complications such as smoking, obesity, diabetes, prior surgery, etc were discussed.  I feel the risks of no intervention will lead to serious problems that outweigh the operative risks; therefore, I recommended surgery to reduce and repair the hernia. I explained laparoscopic techniques with possible need for an open approach. I noted usual use of mesh to patch and/or buttress hernia  repair  Risks such as bleeding, infection, abscess, need for further treatment, heart attack, death, and other risks were discussed. I noted a good likelihood this will help address the problem. Goals of post-operative recovery were discussed as well. Possibility that this will not correct all symptoms was explained. I stressed the importance of low-impact activity, aggressive pain control, avoiding constipation, & not pushing through pain to minimize risk of post-operative chronic pain or injury. Possibility of reherniation was discussed. We will work to minimize complications.  An educational handout further explaining the pathology & treatment options was given as well. Questions were answered.  The patient expresses understanding & wishes to proceed with surgery.  Pt Education - [facility] Hernia Post-Op HCI (Justinian Miano): discussed with patient and provided information. Pt Education - [facility] Pain Control (Aldair Rickel)  Adin Hector, M.D., F.A.C.S. Gastrointestinal and Minimally Invasive Surgery [facility] [address] [place],  [address] [phone] Main / Paging

## 2015-02-21 ENCOUNTER — Other Ambulatory Visit: Payer: Self-pay | Admitting: Family Medicine

## 2015-03-03 ENCOUNTER — Ambulatory Visit: Payer: Medicare HMO | Admitting: Internal Medicine

## 2015-03-17 ENCOUNTER — Telehealth: Payer: Self-pay | Admitting: Internal Medicine

## 2015-03-17 NOTE — Telephone Encounter (Signed)
Patient's wife notified if he is still having dysphagia he should keep appt for Monday.  He was asleep. She will talk to him and call back if they want to reschedule or cancel

## 2015-03-18 ENCOUNTER — Emergency Department (HOSPITAL_COMMUNITY): Payer: Medicare HMO

## 2015-03-18 ENCOUNTER — Encounter (HOSPITAL_COMMUNITY)
Admission: EM | Disposition: A | Discharge status: Home Health | Payer: Medicare HMO | Source: Home / Self Care | Attending: Thoracic Surgery (Cardiothoracic Vascular Surgery)

## 2015-03-18 ENCOUNTER — Inpatient Hospital Stay (HOSPITAL_COMMUNITY): Payer: Medicare HMO

## 2015-03-18 ENCOUNTER — Encounter (HOSPITAL_COMMUNITY): Payer: Self-pay | Admitting: Emergency Medicine

## 2015-03-18 ENCOUNTER — Ambulatory Visit (HOSPITAL_COMMUNITY): Payer: Medicare HMO

## 2015-03-18 ENCOUNTER — Ambulatory Visit (HOSPITAL_COMMUNITY): Payer: Medicare HMO | Admitting: Certified Registered Nurse Anesthetist

## 2015-03-18 ENCOUNTER — Inpatient Hospital Stay (HOSPITAL_COMMUNITY)
Admission: EM | Admit: 2015-03-18 | Discharge: 2015-03-26 | DRG: 234 | Disposition: A | Discharge status: Home Health | Payer: Medicare HMO | Attending: Thoracic Surgery (Cardiothoracic Vascular Surgery) | Admitting: Thoracic Surgery (Cardiothoracic Vascular Surgery)

## 2015-03-18 DIAGNOSIS — K4090 Unilateral inguinal hernia, without obstruction or gangrene, not specified as recurrent: Secondary | ICD-10-CM

## 2015-03-18 DIAGNOSIS — I10 Essential (primary) hypertension: Secondary | ICD-10-CM | POA: Diagnosis present

## 2015-03-18 DIAGNOSIS — R410 Disorientation, unspecified: Secondary | ICD-10-CM

## 2015-03-18 DIAGNOSIS — Z87891 Personal history of nicotine dependence: Secondary | ICD-10-CM | POA: Diagnosis not present

## 2015-03-18 DIAGNOSIS — D696 Thrombocytopenia, unspecified: Secondary | ICD-10-CM | POA: Diagnosis not present

## 2015-03-18 DIAGNOSIS — I2111 ST elevation (STEMI) myocardial infarction involving right coronary artery: Secondary | ICD-10-CM

## 2015-03-18 DIAGNOSIS — I2510 Atherosclerotic heart disease of native coronary artery without angina pectoris: Secondary | ICD-10-CM | POA: Diagnosis not present

## 2015-03-18 DIAGNOSIS — E1121 Type 2 diabetes mellitus with diabetic nephropathy: Secondary | ICD-10-CM

## 2015-03-18 DIAGNOSIS — N990 Postprocedural (acute) (chronic) kidney failure: Secondary | ICD-10-CM | POA: Diagnosis not present

## 2015-03-18 DIAGNOSIS — R4182 Altered mental status, unspecified: Secondary | ICD-10-CM | POA: Diagnosis present

## 2015-03-18 DIAGNOSIS — Z7982 Long term (current) use of aspirin: Secondary | ICD-10-CM | POA: Diagnosis not present

## 2015-03-18 DIAGNOSIS — I4891 Unspecified atrial fibrillation: Secondary | ICD-10-CM | POA: Diagnosis not present

## 2015-03-18 DIAGNOSIS — I2119 ST elevation (STEMI) myocardial infarction involving other coronary artery of inferior wall: Principal | ICD-10-CM | POA: Diagnosis present

## 2015-03-18 DIAGNOSIS — Z86010 Personal history of colonic polyps: Secondary | ICD-10-CM

## 2015-03-18 DIAGNOSIS — I493 Ventricular premature depolarization: Secondary | ICD-10-CM | POA: Diagnosis not present

## 2015-03-18 DIAGNOSIS — K219 Gastro-esophageal reflux disease without esophagitis: Secondary | ICD-10-CM | POA: Diagnosis present

## 2015-03-18 DIAGNOSIS — I739 Peripheral vascular disease, unspecified: Secondary | ICD-10-CM | POA: Diagnosis present

## 2015-03-18 DIAGNOSIS — Z833 Family history of diabetes mellitus: Secondary | ICD-10-CM | POA: Diagnosis not present

## 2015-03-18 DIAGNOSIS — Z79899 Other long term (current) drug therapy: Secondary | ICD-10-CM | POA: Diagnosis not present

## 2015-03-18 DIAGNOSIS — E8770 Fluid overload, unspecified: Secondary | ICD-10-CM | POA: Diagnosis not present

## 2015-03-18 DIAGNOSIS — I25110 Atherosclerotic heart disease of native coronary artery with unstable angina pectoris: Secondary | ICD-10-CM

## 2015-03-18 DIAGNOSIS — Z7984 Long term (current) use of oral hypoglycemic drugs: Secondary | ICD-10-CM | POA: Diagnosis not present

## 2015-03-18 DIAGNOSIS — Z9889 Other specified postprocedural states: Secondary | ICD-10-CM

## 2015-03-18 DIAGNOSIS — D62 Acute posthemorrhagic anemia: Secondary | ICD-10-CM | POA: Diagnosis not present

## 2015-03-18 DIAGNOSIS — E785 Hyperlipidemia, unspecified: Secondary | ICD-10-CM | POA: Diagnosis present

## 2015-03-18 DIAGNOSIS — E119 Type 2 diabetes mellitus without complications: Secondary | ICD-10-CM

## 2015-03-18 DIAGNOSIS — I213 ST elevation (STEMI) myocardial infarction of unspecified site: Secondary | ICD-10-CM

## 2015-03-18 DIAGNOSIS — Z9689 Presence of other specified functional implants: Secondary | ICD-10-CM

## 2015-03-18 DIAGNOSIS — Z951 Presence of aortocoronary bypass graft: Secondary | ICD-10-CM

## 2015-03-18 HISTORY — PX: CORONARY ARTERY BYPASS GRAFT: SHX141

## 2015-03-18 HISTORY — PX: INTRAOPERATIVE TRANSESOPHAGEAL ECHOCARDIOGRAM: SHX5062

## 2015-03-18 HISTORY — PX: CARDIAC CATHETERIZATION: SHX172

## 2015-03-18 LAB — COMPREHENSIVE METABOLIC PANEL
ALBUMIN: 4.3 g/dL (ref 3.5–5.0)
ALK PHOS: 63 U/L (ref 38–126)
ALT: 41 U/L (ref 17–63)
ANION GAP: 14 (ref 5–15)
AST: 172 U/L — ABNORMAL HIGH (ref 15–41)
BILIRUBIN TOTAL: 1.5 mg/dL — AB (ref 0.3–1.2)
BUN: 61 mg/dL — ABNORMAL HIGH (ref 6–20)
CALCIUM: 9.5 mg/dL (ref 8.9–10.3)
CO2: 26 mmol/L (ref 22–32)
CREATININE: 2.18 mg/dL — AB (ref 0.61–1.24)
Chloride: 93 mmol/L — ABNORMAL LOW (ref 101–111)
GFR calc non Af Amer: 28 mL/min — ABNORMAL LOW (ref >60)
GFR, EST AFRICAN AMERICAN: 33 mL/min — AB (ref >60)
GLUCOSE: 221 mg/dL — AB (ref 65–99)
Potassium: 4.7 mmol/L (ref 3.5–5.1)
Sodium: 133 mmol/L — ABNORMAL LOW (ref 135–145)
TOTAL PROTEIN: 8.1 g/dL (ref 6.5–8.1)

## 2015-03-18 LAB — POCT I-STAT, CHEM 8
BUN: 51 mg/dL — ABNORMAL HIGH (ref 6–20)
BUN: 47 mg/dL — AB (ref 6–20)
BUN: 45 mg/dL — AB (ref 6–20)
BUN: 45 mg/dL — AB (ref 6–20)
BUN: 43 mg/dL — AB (ref 6–20)
BUN: 42 mg/dL — ABNORMAL HIGH (ref 6–20)
BUN: 46 mg/dL — ABNORMAL HIGH (ref 6–20)
CALCIUM ION: 1.17 mmol/L (ref 1.13–1.30)
CALCIUM ION: 1.14 mmol/L (ref 1.13–1.30)
CALCIUM ION: 1.04 mmol/L — AB (ref 1.13–1.30)
CALCIUM ION: 1.02 mmol/L — AB (ref 1.13–1.30)
CHLORIDE: 96 mmol/L — AB (ref 101–111)
CHLORIDE: 96 mmol/L — AB (ref 101–111)
CHLORIDE: 97 mmol/L — AB (ref 101–111)
CHLORIDE: 100 mmol/L — AB (ref 101–111)
CREATININE: 1.4 mg/dL — AB (ref 0.61–1.24)
CREATININE: 1.7 mg/dL — AB (ref 0.61–1.24)
CREATININE: 1.6 mg/dL — AB (ref 0.61–1.24)
Calcium, Ion: 1.16 mmol/L (ref 1.13–1.30)
Calcium, Ion: 0.97 mmol/L — ABNORMAL LOW (ref 1.13–1.30)
Calcium, Ion: 1.07 mmol/L — ABNORMAL LOW (ref 1.13–1.30)
Chloride: 96 mmol/L — ABNORMAL LOW (ref 101–111)
Chloride: 99 mmol/L — ABNORMAL LOW (ref 101–111)
Chloride: 98 mmol/L — ABNORMAL LOW (ref 101–111)
Creatinine, Ser: 2.1 mg/dL — ABNORMAL HIGH (ref 0.61–1.24)
Creatinine, Ser: 1.8 mg/dL — ABNORMAL HIGH (ref 0.61–1.24)
Creatinine, Ser: 1.7 mg/dL — ABNORMAL HIGH (ref 0.61–1.24)
Creatinine, Ser: 1.7 mg/dL — ABNORMAL HIGH (ref 0.61–1.24)
GLUCOSE: 193 mg/dL — AB (ref 65–99)
GLUCOSE: 190 mg/dL — AB (ref 65–99)
GLUCOSE: 189 mg/dL — AB (ref 65–99)
Glucose, Bld: 202 mg/dL — ABNORMAL HIGH (ref 65–99)
Glucose, Bld: 184 mg/dL — ABNORMAL HIGH (ref 65–99)
Glucose, Bld: 158 mg/dL — ABNORMAL HIGH (ref 65–99)
Glucose, Bld: 223 mg/dL — ABNORMAL HIGH (ref 65–99)
HCT: 25 % — ABNORMAL LOW (ref 39.0–52.0)
HCT: 20 % — ABNORMAL LOW (ref 39.0–52.0)
HEMATOCRIT: 33 % — AB (ref 39.0–52.0)
HEMATOCRIT: 29 % — AB (ref 39.0–52.0)
HEMATOCRIT: 21 % — AB (ref 39.0–52.0)
HEMATOCRIT: 21 % — AB (ref 39.0–52.0)
HEMATOCRIT: 22 % — AB (ref 39.0–52.0)
HEMOGLOBIN: 11.2 g/dL — AB (ref 13.0–17.0)
HEMOGLOBIN: 7.1 g/dL — AB (ref 13.0–17.0)
HEMOGLOBIN: 7.5 g/dL — AB (ref 13.0–17.0)
Hemoglobin: 9.9 g/dL — ABNORMAL LOW (ref 13.0–17.0)
Hemoglobin: 7.1 g/dL — ABNORMAL LOW (ref 13.0–17.0)
Hemoglobin: 8.5 g/dL — ABNORMAL LOW (ref 13.0–17.0)
Hemoglobin: 6.8 g/dL — CL (ref 13.0–17.0)
POTASSIUM: 4.4 mmol/L (ref 3.5–5.1)
Potassium: 5 mmol/L (ref 3.5–5.1)
Potassium: 4.8 mmol/L (ref 3.5–5.1)
Potassium: 4.2 mmol/L (ref 3.5–5.1)
Potassium: 6.5 mmol/L (ref 3.5–5.1)
Potassium: 6.1 mmol/L (ref 3.5–5.1)
Potassium: 4.3 mmol/L (ref 3.5–5.1)
SODIUM: 134 mmol/L — AB (ref 135–145)
SODIUM: 134 mmol/L — AB (ref 135–145)
SODIUM: 133 mmol/L — AB (ref 135–145)
SODIUM: 133 mmol/L — AB (ref 135–145)
SODIUM: 134 mmol/L — AB (ref 135–145)
Sodium: 135 mmol/L (ref 135–145)
Sodium: 132 mmol/L — ABNORMAL LOW (ref 135–145)
TCO2: 26 mmol/L (ref 0–100)
TCO2: 28 mmol/L (ref 0–100)
TCO2: 28 mmol/L (ref 0–100)
TCO2: 30 mmol/L (ref 0–100)
TCO2: 29 mmol/L (ref 0–100)
TCO2: 31 mmol/L (ref 0–100)
TCO2: 25 mmol/L (ref 0–100)

## 2015-03-18 LAB — POCT I-STAT 3, ART BLOOD GAS (G3+)
ACID-BASE EXCESS: 3 mmol/L — AB (ref 0.0–2.0)
ACID-BASE EXCESS: 4 mmol/L — AB (ref 0.0–2.0)
Acid-Base Excess: 1 mmol/L (ref 0.0–2.0)
Bicarbonate: 26.5 mEq/L — ABNORMAL HIGH (ref 20.0–24.0)
Bicarbonate: 28.3 mEq/L — ABNORMAL HIGH (ref 20.0–24.0)
Bicarbonate: 26.9 mEq/L — ABNORMAL HIGH (ref 20.0–24.0)
O2 SAT: 100 %
O2 SAT: 100 %
O2 Saturation: 100 %
PCO2 ART: 43.1 mmHg (ref 35.0–45.0)
PO2 ART: 425 mmHg — AB (ref 80.0–100.0)
PO2 ART: 455 mmHg — AB (ref 80.0–100.0)
TCO2: 28 mmol/L (ref 0–100)
TCO2: 30 mmol/L (ref 0–100)
TCO2: 28 mmol/L (ref 0–100)
pCO2 arterial: 48.9 mmHg — ABNORMAL HIGH (ref 35.0–45.0)
pCO2 arterial: 33.4 mmHg — ABNORMAL LOW (ref 35.0–45.0)
pH, Arterial: 7.397 (ref 7.350–7.450)
pH, Arterial: 7.371 (ref 7.350–7.450)
pH, Arterial: 7.513 — ABNORMAL HIGH (ref 7.350–7.450)
pO2, Arterial: 244 mmHg — ABNORMAL HIGH (ref 80.0–100.0)

## 2015-03-18 LAB — POCT I-STAT 4, (NA,K, GLUC, HGB,HCT)
GLUCOSE: 161 mg/dL — AB (ref 65–99)
HCT: 25 % — ABNORMAL LOW (ref 39.0–52.0)
Hemoglobin: 8.5 g/dL — ABNORMAL LOW (ref 13.0–17.0)
POTASSIUM: 4 mmol/L (ref 3.5–5.1)
Sodium: 135 mmol/L (ref 135–145)

## 2015-03-18 LAB — CBC
HCT: 33.6 % — ABNORMAL LOW (ref 39.0–52.0)
HCT: 23.4 % — ABNORMAL LOW (ref 39.0–52.0)
HEMOGLOBIN: 11.2 g/dL — AB (ref 13.0–17.0)
Hemoglobin: 8 g/dL — ABNORMAL LOW (ref 13.0–17.0)
MCH: 32.7 pg (ref 26.0–34.0)
MCH: 32.1 pg (ref 26.0–34.0)
MCHC: 33.3 g/dL (ref 30.0–36.0)
MCHC: 34.2 g/dL (ref 30.0–36.0)
MCV: 98.2 fL (ref 78.0–100.0)
MCV: 94 fL (ref 78.0–100.0)
PLATELETS: 188 10*3/uL (ref 150–400)
PLATELETS: 77 10*3/uL — AB (ref 150–400)
RBC: 3.42 MIL/uL — ABNORMAL LOW (ref 4.22–5.81)
RBC: 2.49 MIL/uL — ABNORMAL LOW (ref 4.22–5.81)
RDW: 13.3 % (ref 11.5–15.5)
RDW: 14 % (ref 11.5–15.5)
WBC: 14.1 10*3/uL — ABNORMAL HIGH (ref 4.0–10.5)
WBC: 13 10*3/uL — ABNORMAL HIGH (ref 4.0–10.5)

## 2015-03-18 LAB — APTT
APTT: 31 s (ref 24–37)
aPTT: 35 seconds (ref 24–37)

## 2015-03-18 LAB — HEMOGLOBIN AND HEMATOCRIT, BLOOD
HEMATOCRIT: 20 % — AB (ref 39.0–52.0)
HEMOGLOBIN: 6.9 g/dL — AB (ref 13.0–17.0)

## 2015-03-18 LAB — PREPARE RBC (CROSSMATCH)

## 2015-03-18 LAB — PROTIME-INR
INR: 1.01 (ref 0.00–1.49)
INR: 1.63 — ABNORMAL HIGH (ref 0.00–1.49)
PROTHROMBIN TIME: 13.5 s (ref 11.6–15.2)
Prothrombin Time: 19.3 seconds — ABNORMAL HIGH (ref 11.6–15.2)

## 2015-03-18 LAB — I-STAT TROPONIN, ED: Troponin i, poc: 22.76 ng/mL (ref 0.00–0.08)

## 2015-03-18 LAB — PLATELET COUNT: PLATELETS: 110 10*3/uL — AB (ref 150–400)

## 2015-03-18 LAB — GLUCOSE, CAPILLARY
Glucose-Capillary: 110 mg/dL — ABNORMAL HIGH (ref 65–99)
Glucose-Capillary: 131 mg/dL — ABNORMAL HIGH (ref 65–99)

## 2015-03-18 LAB — ABO/RH: ABO/RH(D): AB POS

## 2015-03-18 LAB — CBG MONITORING, ED: GLUCOSE-CAPILLARY: 180 mg/dL — AB (ref 65–99)

## 2015-03-18 IMAGING — CR DG CHEST 1V PORT
1 series · 1 of 1 positions shown · non-contrast
Comparison: None.

CLINICAL DATA: Post CABG x3

EXAM:
PORTABLE CHEST 1 VIEW

[AP]
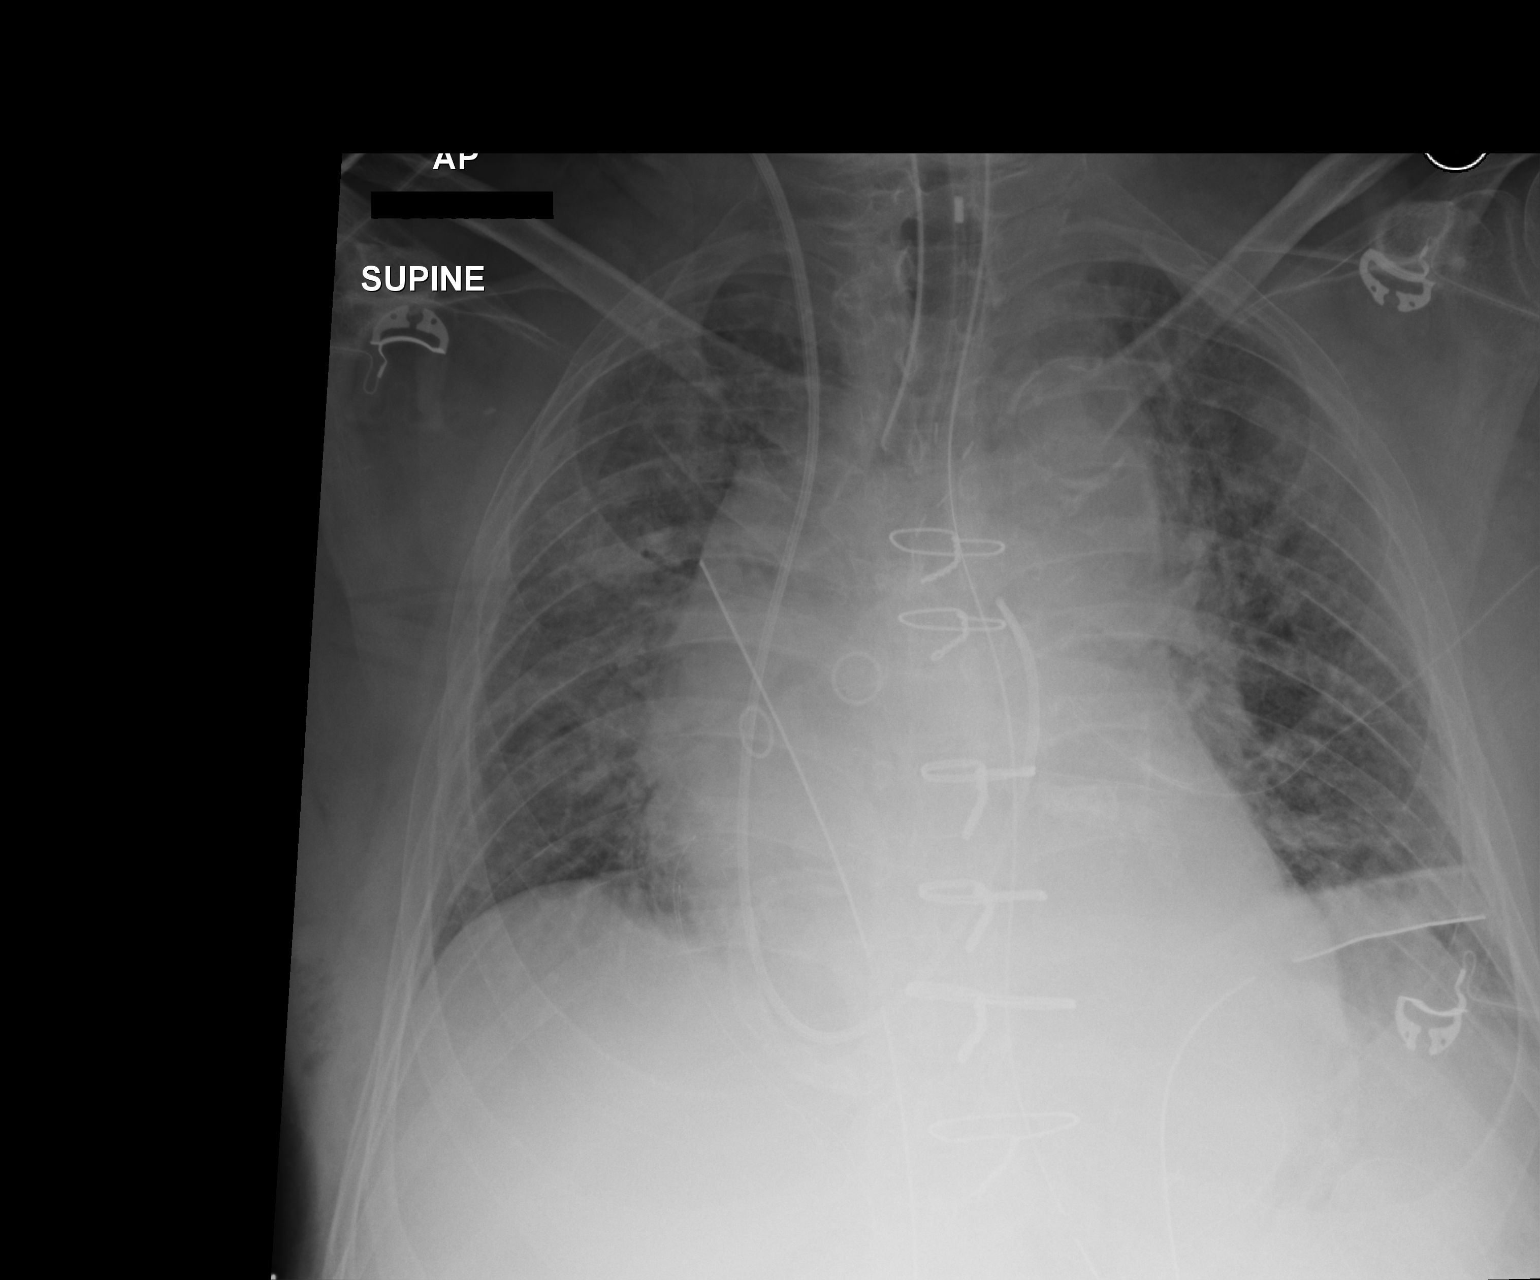

[1 of 1 positions shown; findings below may reference images not displayed]

FINDINGS: Postoperative changes in the mediastinum with sternotomy wires and
vascular markers present. Enteric tube tip is off the field of view
but below the left hemidiaphragm. Left chest tube. Mediastinal
drain. Right Swan-Ganz catheter with tip over the pulmonary outflow
tract. Shallow inspiration. Diffuse cardiac enlargement. Increased
pulmonary vascularity suggesting vascular congestion. No blunting of
costophrenic angles. No pneumothorax.
IMPRESSION: Appliances appear in satisfactory location. Cardiac enlargement with
pulmonary vascular congestion appear

## 2015-03-18 SURGERY — LEFT HEART CATH AND CORONARY ANGIOGRAPHY
Anesthesia: LOCAL

## 2015-03-18 SURGERY — CORONARY ARTERY BYPASS GRAFTING (CABG)
Anesthesia: General | Site: Chest

## 2015-03-18 MED ORDER — PANTOPRAZOLE SODIUM 40 MG PO TBEC
80.0000 mg | DELAYED_RELEASE_TABLET | Freq: Every day | ORAL | Status: DC
Start: 2015-03-19 — End: 2015-03-18

## 2015-03-18 MED ORDER — ETOMIDATE 2 MG/ML IV SOLN
INTRAVENOUS | Status: AC
  Filled 2015-03-18: qty 10

## 2015-03-18 MED ORDER — SODIUM CHLORIDE 0.9 % IV SOLN
INTRAVENOUS | Status: DC
  Administered 2015-03-18: 250 mL via INTRAVENOUS

## 2015-03-18 MED ORDER — METOPROLOL TARTRATE 25 MG/10 ML ORAL SUSPENSION: 12.5000 mg | Freq: Two times a day (BID) | ORAL | Status: DC

## 2015-03-18 MED ORDER — MILRINONE IN DEXTROSE 20 MG/100ML IV SOLN
0.3750 ug/kg/min | INTRAVENOUS | Status: DC
  Administered 2015-03-19: 0.375 ug/kg/min via INTRAVENOUS
  Administered 2015-03-19: 0.3 ug/kg/min via INTRAVENOUS
  Filled 2015-03-18 (×2): qty 100

## 2015-03-18 MED ORDER — PHENYLEPHRINE HCL 10 MG/ML IJ SOLN
0.0000 ug/min | INTRAVENOUS | Status: DC
  Filled 2015-03-18: qty 2

## 2015-03-18 MED ORDER — VECURONIUM BROMIDE 10 MG IV SOLR
INTRAVENOUS | Status: DC | PRN
  Administered 2015-03-18 (×3): 5 mg via INTRAVENOUS

## 2015-03-18 MED ORDER — BISACODYL 10 MG RE SUPP: 10.0000 mg | Freq: Every day | RECTAL | Status: DC

## 2015-03-18 MED ORDER — ALBUMIN HUMAN 5 % IV SOLN
INTRAVENOUS | Status: DC | PRN
  Administered 2015-03-18: 21:00:00 via INTRAVENOUS

## 2015-03-18 MED ORDER — OXYCODONE HCL 5 MG PO TABS: 5.0000 mg | ORAL_TABLET | ORAL | Status: DC | PRN

## 2015-03-18 MED ORDER — MIDAZOLAM HCL 2 MG/2ML IJ SOLN
INTRAMUSCULAR | Status: AC
  Filled 2015-03-18: qty 2

## 2015-03-18 MED ORDER — TAMSULOSIN HCL 0.4 MG PO CAPS
0.4000 mg | ORAL_CAPSULE | Freq: Every day | ORAL | Status: DC
  Administered 2015-03-21 – 2015-03-26 (×6): 0.4 mg via ORAL
  Filled 2015-03-18 (×6): qty 1

## 2015-03-18 MED ORDER — SODIUM CHLORIDE 0.9 % IJ SOLN
OROMUCOSAL | Status: DC | PRN
  Administered 2015-03-18 (×3): 4 mL via TOPICAL

## 2015-03-18 MED ORDER — SODIUM CHLORIDE 0.9 % IV SOLN
INTRAVENOUS | Status: DC
  Administered 2015-03-19: 0.7 [IU]/h via INTRAVENOUS
  Administered 2015-03-20: 1.5 [IU]/h via INTRAVENOUS
  Filled 2015-03-18 (×2): qty 2.5

## 2015-03-18 MED ORDER — PROTAMINE SULFATE 10 MG/ML IV SOLN
INTRAVENOUS | Status: AC
  Filled 2015-03-18: qty 25

## 2015-03-18 MED ORDER — SODIUM CHLORIDE 0.9 % IV SOLN
INTRAVENOUS | Status: DC | PRN
  Administered 2015-03-18: 10 mL/h via INTRAVENOUS

## 2015-03-18 MED ORDER — FAMOTIDINE 40 MG PO TABS: 40.0000 mg | ORAL_TABLET | Freq: Every day | ORAL | Status: DC

## 2015-03-18 MED ORDER — LIDOCAINE HCL (CARDIAC) 20 MG/ML IV SOLN
INTRAVENOUS | Status: DC | PRN
  Administered 2015-03-18: 50 mg via INTRAVENOUS

## 2015-03-18 MED ORDER — PANTOPRAZOLE SODIUM 40 MG PO TBEC
40.0000 mg | DELAYED_RELEASE_TABLET | Freq: Every day | ORAL | Status: DC
  Administered 2015-03-21 – 2015-03-24 (×4): 40 mg via ORAL
  Filled 2015-03-18 (×4): qty 1

## 2015-03-18 MED ORDER — FENTANYL CITRATE (PF) 100 MCG/2ML IJ SOLN
INTRAMUSCULAR | Status: DC | PRN
  Administered 2015-03-18: 250 ug via INTRAVENOUS
  Administered 2015-03-18: 150 ug via INTRAVENOUS

## 2015-03-18 MED ORDER — LACTATED RINGERS IV SOLN: 500.0000 mL | Freq: Once | INTRAVENOUS | Status: DC | PRN

## 2015-03-18 MED ORDER — HEPARIN (PORCINE) IN NACL 2-0.9 UNIT/ML-% IJ SOLN
INTRAMUSCULAR | Status: AC
  Filled 2015-03-18: qty 1000

## 2015-03-18 MED ORDER — NOREPINEPHRINE BITARTRATE 1 MG/ML IV SOLN
0.0000 ug/min | INTRAVENOUS | Status: DC
  Administered 2015-03-19: 25 ug/min via INTRAVENOUS
  Administered 2015-03-19 (×2): 18 ug/min via INTRAVENOUS
  Filled 2015-03-18 (×3): qty 4

## 2015-03-18 MED ORDER — VECURONIUM BROMIDE 10 MG IV SOLR
INTRAVENOUS | Status: AC
  Filled 2015-03-18: qty 20

## 2015-03-18 MED ORDER — MILRINONE IN DEXTROSE 20 MG/100ML IV SOLN
0.2500 ug/kg/min | INTRAVENOUS | Status: DC
  Administered 2015-03-18: 0.375 ug/kg/min via INTRAVENOUS
  Filled 2015-03-18: qty 100

## 2015-03-18 MED ORDER — DEXTROSE 5 % IV SOLN
750.0000 mg | INTRAVENOUS | Status: DC
  Filled 2015-03-18: qty 750

## 2015-03-18 MED ORDER — ROCURONIUM BROMIDE 100 MG/10ML IV SOLN
INTRAVENOUS | Status: DC | PRN
  Administered 2015-03-18: 50 mg via INTRAVENOUS

## 2015-03-18 MED ORDER — SODIUM CHLORIDE 0.9 % IJ SOLN
3.0000 mL | Freq: Two times a day (BID) | INTRAMUSCULAR | Status: DC
  Administered 2015-03-19 – 2015-03-23 (×7): 3 mL via INTRAVENOUS

## 2015-03-18 MED ORDER — SODIUM CHLORIDE 0.9 % IV SOLN
INTRAVENOUS | Status: AC
  Administered 2015-03-18: 14 mL/h via INTRAVENOUS
  Filled 2015-03-18: qty 40

## 2015-03-18 MED ORDER — METOPROLOL TARTRATE 12.5 MG HALF TABLET: 12.5000 mg | ORAL_TABLET | Freq: Two times a day (BID) | ORAL | Status: DC

## 2015-03-18 MED ORDER — PHENYLEPHRINE HCL 10 MG/ML IJ SOLN
30.0000 ug/min | INTRAMUSCULAR | Status: AC
Start: 2015-03-18 — End: 2015-03-18
  Administered 2015-03-18: 50 ug/min via INTRAVENOUS
  Filled 2015-03-18: qty 2

## 2015-03-18 MED ORDER — VANCOMYCIN HCL IN DEXTROSE 1-5 GM/200ML-% IV SOLN
1000.0000 mg | Freq: Once | INTRAVENOUS | Status: AC
  Administered 2015-03-19: 1000 mg via INTRAVENOUS
  Filled 2015-03-18 (×2): qty 200

## 2015-03-18 MED ORDER — SODIUM CHLORIDE 0.9 % IJ SOLN: 3.0000 mL | INTRAMUSCULAR | Status: DC | PRN

## 2015-03-18 MED ORDER — PLASMA-LYTE 148 IV SOLN
INTRAVENOUS | Status: AC
  Administered 2015-03-18: 500 mL
  Filled 2015-03-18: qty 2.5

## 2015-03-18 MED ORDER — AMINOCAPROIC ACID 250 MG/ML IV SOLN
INTRAVENOUS | Status: DC | PRN
  Administered 2015-03-18: 5 g via INTRAVENOUS

## 2015-03-18 MED ORDER — EPINEPHRINE HCL 1 MG/ML IJ SOLN
0.0000 ug/min | INTRAVENOUS | Status: AC
  Administered 2015-03-18: 5 ug/min via INTRAVENOUS
  Filled 2015-03-18: qty 4

## 2015-03-18 MED ORDER — SODIUM CHLORIDE 0.9 % IV SOLN
INTRAVENOUS | Status: DC
  Filled 2015-03-18: qty 30

## 2015-03-18 MED ORDER — ASPIRIN 81 MG PO CHEW
324.0000 mg | CHEWABLE_TABLET | Freq: Once | ORAL | Status: AC
  Administered 2015-03-18: 324 mg via ORAL
  Filled 2015-03-18: qty 4

## 2015-03-18 MED ORDER — ACETAMINOPHEN 500 MG PO TABS
1000.0000 mg | ORAL_TABLET | Freq: Four times a day (QID) | ORAL | Status: AC
  Administered 2015-03-20 – 2015-03-23 (×10): 1000 mg via ORAL
  Filled 2015-03-18 (×11): qty 2

## 2015-03-18 MED ORDER — ACETAMINOPHEN 160 MG/5ML PO SOLN
1000.0000 mg | Freq: Four times a day (QID) | ORAL | Status: AC
  Administered 2015-03-19 – 2015-03-20 (×5): 1000 mg
  Filled 2015-03-18 (×5): qty 40.6

## 2015-03-18 MED ORDER — EPINEPHRINE HCL 1 MG/ML IJ SOLN
0.5000 ug/min | INTRAVENOUS | Status: DC
  Administered 2015-03-19 – 2015-03-20 (×3): 5 ug/min via INTRAVENOUS
  Filled 2015-03-18 (×4): qty 4

## 2015-03-18 MED ORDER — FENTANYL CITRATE (PF) 250 MCG/5ML IJ SOLN
INTRAMUSCULAR | Status: AC
Start: 2015-03-18 — End: 2015-03-18
  Filled 2015-03-18: qty 20

## 2015-03-18 MED ORDER — ONDANSETRON HCL 4 MG/2ML IJ SOLN
4.0000 mg | Freq: Four times a day (QID) | INTRAMUSCULAR | Status: DC | PRN
  Administered 2015-03-21: 4 mg via INTRAVENOUS
  Filled 2015-03-18: qty 2

## 2015-03-18 MED ORDER — LIDOCAINE HCL (CARDIAC) 20 MG/ML IV SOLN
INTRAVENOUS | Status: AC
  Filled 2015-03-18: qty 5

## 2015-03-18 MED ORDER — IOHEXOL 350 MG/ML SOLN
INTRAVENOUS | Status: DC | PRN
  Administered 2015-03-18: 80 mL via INTRACARDIAC

## 2015-03-18 MED ORDER — PROTAMINE SULFATE 10 MG/ML IV SOLN
INTRAVENOUS | Status: DC | PRN
Start: 2015-03-18 — End: 2015-03-18
  Administered 2015-03-18: 10 mg via INTRAVENOUS
  Administered 2015-03-18: 280 mg via INTRAVENOUS

## 2015-03-18 MED ORDER — INSULIN REGULAR BOLUS VIA INFUSION
0.0000 [IU] | Freq: Three times a day (TID) | INTRAVENOUS | Status: DC
  Filled 2015-03-18: qty 10

## 2015-03-18 MED ORDER — SUCCINYLCHOLINE CHLORIDE 20 MG/ML IJ SOLN
INTRAMUSCULAR | Status: AC
  Filled 2015-03-18: qty 1

## 2015-03-18 MED ORDER — NITROGLYCERIN IN D5W 200-5 MCG/ML-% IV SOLN
2.0000 ug/min | INTRAVENOUS | Status: AC
  Administered 2015-03-18: 10 ug/min via INTRAVENOUS
  Filled 2015-03-18: qty 250

## 2015-03-18 MED ORDER — MAGNESIUM SULFATE 4 GM/100ML IV SOLN
INTRAVENOUS | Status: AC
  Filled 2015-03-18: qty 100

## 2015-03-18 MED ORDER — VERAPAMIL HCL 2.5 MG/ML IV SOLN
INTRAVENOUS | Status: AC
  Filled 2015-03-18: qty 2

## 2015-03-18 MED ORDER — LACTATED RINGERS IV SOLN
INTRAVENOUS | Status: DC
  Administered 2015-03-18 – 2015-03-22 (×2): via INTRAVENOUS

## 2015-03-18 MED ORDER — MIDAZOLAM HCL 2 MG/2ML IJ SOLN
2.0000 mg | INTRAMUSCULAR | Status: DC | PRN
Start: 2015-03-18 — End: 2015-03-21
  Administered 2015-03-19 – 2015-03-20 (×6): 2 mg via INTRAVENOUS
  Filled 2015-03-18 (×6): qty 2

## 2015-03-18 MED ORDER — ROCURONIUM BROMIDE 50 MG/5ML IV SOLN
INTRAVENOUS | Status: AC
  Filled 2015-03-18: qty 1

## 2015-03-18 MED ORDER — DEXTROSE 5 % IV SOLN
1.5000 g | INTRAVENOUS | Status: AC
  Administered 2015-03-18: .75 g via INTRAVENOUS
  Administered 2015-03-18: 1.5 g via INTRAVENOUS
  Filled 2015-03-18: qty 1.5

## 2015-03-18 MED ORDER — VASOPRESSIN 20 UNIT/ML IV SOLN
0.0300 [IU]/min | INTRAVENOUS | Status: DC
  Administered 2015-03-18 (×2): 0.04 [IU]/min via INTRAVENOUS
  Filled 2015-03-18: qty 2

## 2015-03-18 MED ORDER — ETOMIDATE 2 MG/ML IV SOLN
INTRAVENOUS | Status: DC | PRN
  Administered 2015-03-18: 10 mg via INTRAVENOUS

## 2015-03-18 MED ORDER — HEPARIN SODIUM (PORCINE) 1000 UNIT/ML IJ SOLN
INTRAMUSCULAR | Status: DC | PRN
  Administered 2015-03-18: 27000 [IU] via INTRAVENOUS
  Administered 2015-03-18: 2000 [IU] via INTRAVENOUS

## 2015-03-18 MED ORDER — DOPAMINE-DEXTROSE 3.2-5 MG/ML-% IV SOLN: 3.0000 ug/kg/min | INTRAVENOUS | Status: DC

## 2015-03-18 MED ORDER — VERAPAMIL HCL 2.5 MG/ML IV SOLN
INTRAVENOUS | Status: DC | PRN
  Administered 2015-03-18: 15:00:00 via INTRA_ARTERIAL

## 2015-03-18 MED ORDER — TRAMADOL HCL 50 MG PO TABS
50.0000 mg | ORAL_TABLET | ORAL | Status: DC | PRN
Start: 2015-03-18 — End: 2015-03-22

## 2015-03-18 MED ORDER — CHLORHEXIDINE GLUCONATE 0.12 % MT SOLN
15.0000 mL | OROMUCOSAL | Status: AC
  Administered 2015-03-18: 15 mL via OROMUCOSAL

## 2015-03-18 MED ORDER — DOCUSATE SODIUM 100 MG PO CAPS
200.0000 mg | ORAL_CAPSULE | Freq: Every day | ORAL | Status: DC
  Administered 2015-03-21 – 2015-03-24 (×2): 200 mg via ORAL
  Filled 2015-03-18 (×4): qty 2

## 2015-03-18 MED ORDER — METOCLOPRAMIDE HCL 5 MG/ML IJ SOLN
10.0000 mg | Freq: Four times a day (QID) | INTRAMUSCULAR | Status: AC
  Administered 2015-03-19 (×3): 10 mg via INTRAVENOUS
  Filled 2015-03-18 (×3): qty 2

## 2015-03-18 MED ORDER — 0.9 % SODIUM CHLORIDE (POUR BTL) OPTIME
TOPICAL | Status: DC | PRN
  Administered 2015-03-18: 1000 mL

## 2015-03-18 MED ORDER — LACTATED RINGERS IV SOLN
INTRAVENOUS | Status: DC | PRN
  Administered 2015-03-18 (×2): via INTRAVENOUS

## 2015-03-18 MED ORDER — HEMOSTATIC AGENTS (NO CHARGE) OPTIME
TOPICAL | Status: DC | PRN
  Administered 2015-03-18: 1 via TOPICAL

## 2015-03-18 MED ORDER — ASPIRIN EC 325 MG PO TBEC
325.0000 mg | DELAYED_RELEASE_TABLET | Freq: Every day | ORAL | Status: DC
  Administered 2015-03-21 – 2015-03-24 (×4): 325 mg via ORAL
  Filled 2015-03-18 (×4): qty 1

## 2015-03-18 MED ORDER — POTASSIUM CHLORIDE 10 MEQ/50ML IV SOLN: 10.0000 meq | INTRAVENOUS | Status: AC

## 2015-03-18 MED ORDER — SODIUM CHLORIDE 0.9 % IV SOLN
INTRAVENOUS | Status: AC
  Administered 2015-03-18: 2.5 [IU]/h via INTRAVENOUS
  Filled 2015-03-18: qty 2.5

## 2015-03-18 MED ORDER — MAGNESIUM SULFATE 50 % IJ SOLN
40.0000 meq | INTRAMUSCULAR | Status: DC
  Filled 2015-03-18: qty 10

## 2015-03-18 MED ORDER — NITROGLYCERIN IN D5W 200-5 MCG/ML-% IV SOLN: 0.0000 ug/min | INTRAVENOUS | Status: DC

## 2015-03-18 MED ORDER — NITROGLYCERIN 1 MG/10 ML FOR IR/CATH LAB
INTRA_ARTERIAL | Status: AC
  Filled 2015-03-18: qty 10

## 2015-03-18 MED ORDER — BISACODYL 5 MG PO TBEC
10.0000 mg | DELAYED_RELEASE_TABLET | Freq: Every day | ORAL | Status: DC
  Administered 2015-03-21 – 2015-03-23 (×2): 10 mg via ORAL
  Filled 2015-03-18 (×3): qty 2

## 2015-03-18 MED ORDER — LACTATED RINGERS IV SOLN
INTRAVENOUS | Status: DC | PRN
  Administered 2015-03-18: 16:00:00 via INTRAVENOUS

## 2015-03-18 MED ORDER — ACETAMINOPHEN 650 MG RE SUPP
650.0000 mg | Freq: Once | RECTAL | Status: AC
  Administered 2015-03-18: 650 mg via RECTAL

## 2015-03-18 MED ORDER — SODIUM CHLORIDE 0.9 % IV SOLN
INTRAVENOUS | Status: DC
  Administered 2015-03-18: 16:00:00 via INTRAVENOUS

## 2015-03-18 MED ORDER — LIDOCAINE HCL (PF) 1 % IJ SOLN
INTRAMUSCULAR | Status: AC
  Filled 2015-03-18: qty 30

## 2015-03-18 MED ORDER — STERILE WATER FOR INJECTION IJ SOLN
INTRAMUSCULAR | Status: AC
  Filled 2015-03-18: qty 20

## 2015-03-18 MED ORDER — DEXMEDETOMIDINE HCL IN NACL 400 MCG/100ML IV SOLN
0.1000 ug/kg/h | INTRAVENOUS | Status: AC
  Administered 2015-03-18: .3 ug/kg/h via INTRAVENOUS
  Filled 2015-03-18: qty 100

## 2015-03-18 MED ORDER — HEPARIN SODIUM (PORCINE) 1000 UNIT/ML IJ SOLN
INTRAMUSCULAR | Status: AC
  Filled 2015-03-18: qty 1

## 2015-03-18 MED ORDER — HEPARIN SODIUM (PORCINE) 1000 UNIT/ML IJ SOLN
INTRAMUSCULAR | Status: DC | PRN
  Administered 2015-03-18: 3000 [IU] via INTRAVENOUS

## 2015-03-18 MED ORDER — ASPIRIN 81 MG PO CHEW
324.0000 mg | CHEWABLE_TABLET | Freq: Every day | ORAL | Status: DC
  Administered 2015-03-19: 324 mg
  Filled 2015-03-18: qty 4

## 2015-03-18 MED ORDER — FAMOTIDINE IN NACL 20-0.9 MG/50ML-% IV SOLN
20.0000 mg | Freq: Two times a day (BID) | INTRAVENOUS | Status: AC
  Administered 2015-03-18 – 2015-03-19 (×2): 20 mg via INTRAVENOUS
  Filled 2015-03-18: qty 50

## 2015-03-18 MED ORDER — DEXMEDETOMIDINE HCL IN NACL 200 MCG/50ML IV SOLN
0.0000 ug/kg/h | INTRAVENOUS | Status: DC
  Administered 2015-03-19 (×2): 0.5 ug/kg/h via INTRAVENOUS
  Administered 2015-03-19: 0.6 ug/kg/h via INTRAVENOUS
  Administered 2015-03-19: 0.5 ug/kg/h via INTRAVENOUS
  Administered 2015-03-20 (×2): 0.7 ug/kg/h via INTRAVENOUS
  Filled 2015-03-18 (×7): qty 50

## 2015-03-18 MED ORDER — NOREPINEPHRINE BITARTRATE 1 MG/ML IV SOLN
0.0000 ug/min | INTRAVENOUS | Status: AC
  Administered 2015-03-18: 5 ug/min via INTRAVENOUS
  Filled 2015-03-18: qty 4

## 2015-03-18 MED ORDER — MAGNESIUM SULFATE 4 GM/100ML IV SOLN
4.0000 g | Freq: Once | INTRAVENOUS | Status: AC
  Administered 2015-03-18: 2 g via INTRAVENOUS

## 2015-03-18 MED ORDER — MORPHINE SULFATE (PF) 2 MG/ML IV SOLN
2.0000 mg | INTRAVENOUS | Status: DC | PRN
  Administered 2015-03-19 – 2015-03-20 (×3): 2 mg via INTRAVENOUS
  Filled 2015-03-18 (×3): qty 1

## 2015-03-18 MED ORDER — ALBUMIN HUMAN 5 % IV SOLN
250.0000 mL | INTRAVENOUS | Status: AC | PRN
  Administered 2015-03-18 – 2015-03-19 (×3): 250 mL via INTRAVENOUS
  Filled 2015-03-18 (×3): qty 250

## 2015-03-18 MED ORDER — POTASSIUM CHLORIDE 2 MEQ/ML IV SOLN
80.0000 meq | INTRAVENOUS | Status: DC
  Filled 2015-03-18: qty 40

## 2015-03-18 MED ORDER — SODIUM CHLORIDE 0.45 % IV SOLN
INTRAVENOUS | Status: DC | PRN
  Administered 2015-03-18: 500 mL via INTRAVENOUS
  Administered 2015-03-19: 20 mL/h via INTRAVENOUS

## 2015-03-18 MED ORDER — MORPHINE SULFATE (PF) 2 MG/ML IV SOLN: 1.0000 mg | INTRAVENOUS | Status: AC | PRN

## 2015-03-18 MED ORDER — SODIUM CHLORIDE 0.9 % IV SOLN: 250.0000 mL | INTRAVENOUS | Status: DC

## 2015-03-18 MED ORDER — ATORVASTATIN CALCIUM 40 MG PO TABS: 40.0000 mg | ORAL_TABLET | Freq: Every day | ORAL | Status: DC

## 2015-03-18 MED ORDER — SUCCINYLCHOLINE CHLORIDE 20 MG/ML IJ SOLN
INTRAMUSCULAR | Status: DC | PRN
  Administered 2015-03-18: 120 mg via INTRAVENOUS

## 2015-03-18 MED ORDER — DEXTROSE 5 % IV SOLN
1.5000 g | Freq: Two times a day (BID) | INTRAVENOUS | Status: AC
  Administered 2015-03-19 – 2015-03-20 (×4): 1.5 g via INTRAVENOUS
  Filled 2015-03-18 (×4): qty 1.5

## 2015-03-18 MED ORDER — MILRINONE IN DEXTROSE 20 MG/100ML IV SOLN: 0.2500 ug/kg/min | INTRAVENOUS | Status: DC

## 2015-03-18 MED ORDER — VANCOMYCIN HCL 10 G IV SOLR
1250.0000 mg | INTRAVENOUS | Status: AC
  Administered 2015-03-18: 1250 mg via INTRAVENOUS
  Filled 2015-03-18: qty 1250

## 2015-03-18 MED ORDER — MIDAZOLAM HCL 10 MG/2ML IJ SOLN
INTRAMUSCULAR | Status: AC
  Filled 2015-03-18: qty 2

## 2015-03-18 MED ORDER — DOPAMINE-DEXTROSE 3.2-5 MG/ML-% IV SOLN
0.0000 ug/kg/min | INTRAVENOUS | Status: AC
Start: 2015-03-18 — End: 2015-03-18
  Administered 2015-03-18: 3 ug/kg/min via INTRAVENOUS
  Filled 2015-03-18: qty 250

## 2015-03-18 MED ORDER — MIDAZOLAM HCL 5 MG/5ML IJ SOLN
INTRAMUSCULAR | Status: DC | PRN
  Administered 2015-03-18: 5 mg via INTRAVENOUS
  Administered 2015-03-18: .25 mg via INTRAVENOUS
  Administered 2015-03-18: 3 mg via INTRAVENOUS
  Administered 2015-03-18: .25 mg via INTRAVENOUS
  Administered 2015-03-18: 0.5 mg via INTRAVENOUS
  Administered 2015-03-18: 2 mg via INTRAVENOUS

## 2015-03-18 MED ORDER — LACTATED RINGERS IV SOLN
INTRAVENOUS | Status: DC
  Administered 2015-03-20: 20 mL/h via INTRAVENOUS

## 2015-03-18 MED ORDER — ACETAMINOPHEN 160 MG/5ML PO SOLN: 650.0000 mg | Freq: Once | ORAL | Status: AC

## 2015-03-18 MED ORDER — METOPROLOL TARTRATE 1 MG/ML IV SOLN
2.5000 mg | INTRAVENOUS | Status: DC | PRN
  Administered 2015-03-20: 2.5 mg via INTRAVENOUS

## 2015-03-18 MED FILL — Heparin Sodium (Porcine) 100 Unt/ML in Sodium Chloride 0.45%: INTRAMUSCULAR | Qty: 250 | Status: AC

## 2015-03-18 SURGICAL SUPPLY — 106 items
ADAPTER CARDIO PERF ANTE/RETRO (ADAPTER) ×4 IMPLANT
BAG DECANTER FOR FLEXI CONT (MISCELLANEOUS) ×4 IMPLANT
BANDAGE ACE 4X5 VEL STRL LF (GAUZE/BANDAGES/DRESSINGS) ×4 IMPLANT
BANDAGE ACE 6X5 VEL STRL LF (GAUZE/BANDAGES/DRESSINGS) ×4 IMPLANT
BANDAGE ELASTIC 4 VELCRO ST LF (GAUZE/BANDAGES/DRESSINGS) ×4 IMPLANT
BANDAGE ELASTIC 6 VELCRO ST LF (GAUZE/BANDAGES/DRESSINGS) ×4 IMPLANT
BASKET HEART  (ORDER IN 25'S) (MISCELLANEOUS) ×1
BASKET HEART (ORDER IN 25'S) (MISCELLANEOUS) ×1
BASKET HEART (ORDER IN 25S) (MISCELLANEOUS) ×2 IMPLANT
BLADE STERNUM SYSTEM 6 (BLADE) ×4 IMPLANT
BNDG GAUZE ELAST 4 BULKY (GAUZE/BANDAGES/DRESSINGS) ×4 IMPLANT
CABLE PACING FASLOC BIEGE (MISCELLANEOUS) ×4 IMPLANT
CANISTER SUCTION 2500CC (MISCELLANEOUS) ×4 IMPLANT
CANNULA EZ GLIDE AORTIC 21FR (CANNULA) ×4 IMPLANT
CANNULA GUNDRY RCSP 15FR (MISCELLANEOUS) ×4 IMPLANT
CATH CPB KIT HENDRICKSON (MISCELLANEOUS) ×4 IMPLANT
CATH HEART VENT LEFT (CATHETERS) ×2 IMPLANT
CATH ROBINSON RED A/P 18FR (CATHETERS) ×4 IMPLANT
CATH THORACIC 36FR (CATHETERS) ×4 IMPLANT
CATH THORACIC 36FR RT ANG (CATHETERS) ×4 IMPLANT
CLIP TI MEDIUM 24 (CLIP) IMPLANT
CLIP TI WIDE RED SMALL 24 (CLIP) ×4 IMPLANT
CRADLE DONUT ADULT HEAD (MISCELLANEOUS) ×4 IMPLANT
DRAPE CARDIOVASCULAR INCISE (DRAPES) ×2
DRAPE SLUSH/WARMER DISC (DRAPES) ×4 IMPLANT
DRAPE SRG 135X102X78XABS (DRAPES) ×2 IMPLANT
DRSG COVADERM 4X14 (GAUZE/BANDAGES/DRESSINGS) ×4 IMPLANT
ELECT REM PT RETURN 9FT ADLT (ELECTROSURGICAL) ×8
ELECTRODE REM PT RTRN 9FT ADLT (ELECTROSURGICAL) ×4 IMPLANT
GAUZE SPONGE 4X4 12PLY STRL (GAUZE/BANDAGES/DRESSINGS) ×8 IMPLANT
GLOVE BIO SURGEON STRL SZ 6 (GLOVE) ×12 IMPLANT
GLOVE BIO SURGEON STRL SZ 6.5 (GLOVE) ×12 IMPLANT
GLOVE BIO SURGEONS STRL SZ 6.5 (GLOVE) ×4
GLOVE SURG SIGNA 7.5 PF LTX (GLOVE) ×12 IMPLANT
GOWN STRL REUS W/ TWL LRG LVL3 (GOWN DISPOSABLE) ×8 IMPLANT
GOWN STRL REUS W/ TWL XL LVL3 (GOWN DISPOSABLE) ×4 IMPLANT
GOWN STRL REUS W/TWL LRG LVL3 (GOWN DISPOSABLE) ×8
GOWN STRL REUS W/TWL XL LVL3 (GOWN DISPOSABLE) ×4
HEMOSTAT POWDER SURGIFOAM 1G (HEMOSTASIS) ×12 IMPLANT
HEMOSTAT SURGICEL 2X14 (HEMOSTASIS) ×4 IMPLANT
INSERT FOGARTY XLG (MISCELLANEOUS) IMPLANT
KIT BASIN OR (CUSTOM PROCEDURE TRAY) ×4 IMPLANT
KIT ROOM TURNOVER OR (KITS) ×4 IMPLANT
KIT SUCTION CATH 14FR (SUCTIONS) ×8 IMPLANT
KIT VASOVIEW W/TROCAR VH 2000 (KITS) ×4 IMPLANT
LINE VENT (MISCELLANEOUS) ×4 IMPLANT
MARKER GRAFT CORONARY BYPASS (MISCELLANEOUS) ×12 IMPLANT
NS IRRIG 1000ML POUR BTL (IV SOLUTION) ×24 IMPLANT
PACK OPEN HEART (CUSTOM PROCEDURE TRAY) ×4 IMPLANT
PAD ARMBOARD 7.5X6 YLW CONV (MISCELLANEOUS) ×8 IMPLANT
PAD ELECT DEFIB RADIOL ZOLL (MISCELLANEOUS) ×4 IMPLANT
PENCIL BUTTON HOLSTER BLD 10FT (ELECTRODE) ×4 IMPLANT
PUNCH AORTIC ROTATE 4.0MM (MISCELLANEOUS) ×4 IMPLANT
PUNCH AORTIC ROTATE 4.5MM 8IN (MISCELLANEOUS) IMPLANT
PUNCH AORTIC ROTATE 5MM 8IN (MISCELLANEOUS) IMPLANT
SET CARDIOPLEGIA MPS 5001102 (MISCELLANEOUS) ×4 IMPLANT
SPONGE GAUZE 4X4 12PLY STER LF (GAUZE/BANDAGES/DRESSINGS) ×8 IMPLANT
SPONGE LAP 18X18 X RAY DECT (DISPOSABLE) ×4 IMPLANT
SUT BONE WAX W31G (SUTURE) ×4 IMPLANT
SUT ETHIBOND 2 0 SH (SUTURE) ×10
SUT ETHIBOND 2 0 SH 36X2 (SUTURE) ×10 IMPLANT
SUT MNCRL AB 4-0 PS2 18 (SUTURE) IMPLANT
SUT PROLENE 3 0 SH DA (SUTURE) ×4 IMPLANT
SUT PROLENE 4 0 RB 1 (SUTURE) ×8
SUT PROLENE 4 0 SH DA (SUTURE) IMPLANT
SUT PROLENE 4-0 RB1 .5 CRCL 36 (SUTURE) ×6 IMPLANT
SUT PROLENE 4-0 RB1 18X2 ARM (SUTURE) ×2 IMPLANT
SUT PROLENE 5 0 C 1 36 (SUTURE) ×8 IMPLANT
SUT PROLENE 6 0 C 1 30 (SUTURE) ×16 IMPLANT
SUT PROLENE 7 0 BV 1 (SUTURE) ×4 IMPLANT
SUT PROLENE 7 0 BV1 MDA (SUTURE) ×4 IMPLANT
SUT PROLENE 8 0 BV175 6 (SUTURE) ×16 IMPLANT
SUT SILK  1 MH (SUTURE) ×6
SUT SILK 1 MH (SUTURE) ×6 IMPLANT
SUT SILK 2 0 SH CR/8 (SUTURE) ×8 IMPLANT
SUT SILK 2 0 TIES 10X30 (SUTURE) ×4 IMPLANT
SUT SILK 2 0 TIES 17X18 (SUTURE) ×2
SUT SILK 2-0 18XBRD TIE BLK (SUTURE) ×2 IMPLANT
SUT SILK 3 0 SH CR/8 (SUTURE) ×4 IMPLANT
SUT SILK 4 0 TIE 10X30 (SUTURE) ×8 IMPLANT
SUT STEEL 6MS V (SUTURE) ×4 IMPLANT
SUT STEEL STERNAL CCS#1 18IN (SUTURE) IMPLANT
SUT STEEL SZ 6 DBL 3X14 BALL (SUTURE) ×4 IMPLANT
SUT TEM PAC WIRE 2 0 SH (SUTURE) ×16 IMPLANT
SUT VIC AB 1 CTX 36 (SUTURE) ×4
SUT VIC AB 1 CTX36XBRD ANBCTR (SUTURE) ×4 IMPLANT
SUT VIC AB 2-0 CT1 27 (SUTURE)
SUT VIC AB 2-0 CT1 TAPERPNT 27 (SUTURE) IMPLANT
SUT VIC AB 2-0 CTX 27 (SUTURE) ×8 IMPLANT
SUT VIC AB 3-0 SH 27 (SUTURE)
SUT VIC AB 3-0 SH 27X BRD (SUTURE) IMPLANT
SUT VIC AB 3-0 X1 27 (SUTURE) ×8 IMPLANT
SUT VICRYL 4-0 PS2 18IN ABS (SUTURE) IMPLANT
SUTURE E-PAK OPEN HEART (SUTURE) ×4 IMPLANT
SYR 5ML LL (SYRINGE) ×4 IMPLANT
SYSTEM SAHARA CHEST DRAIN ATS (WOUND CARE) ×4 IMPLANT
TAPE CLOTH SURG 4X10 WHT LF (GAUZE/BANDAGES/DRESSINGS) ×4 IMPLANT
TAPE PAPER 2X10 WHT MICROPORE (GAUZE/BANDAGES/DRESSINGS) ×4 IMPLANT
TOWEL OR 17X24 6PK STRL BLUE (TOWEL DISPOSABLE) ×8 IMPLANT
TOWEL OR 17X26 10 PK STRL BLUE (TOWEL DISPOSABLE) ×8 IMPLANT
TRAY FOLEY IC TEMP SENS 16FR (CATHETERS) ×4 IMPLANT
TUBE FEEDING 8FR 16IN STR KANG (MISCELLANEOUS) ×4 IMPLANT
TUBING INSUFFLATION (TUBING) ×4 IMPLANT
UNDERPAD 30X30 INCONTINENT (UNDERPADS AND DIAPERS) ×4 IMPLANT
VENT LEFT HEART 12002 (CATHETERS) ×4
WATER STERILE IRR 1000ML POUR (IV SOLUTION) ×8 IMPLANT

## 2015-03-18 SURGICAL SUPPLY — 15 items
CATH INFINITI 5 FR JL3.5 (CATHETERS) ×2 IMPLANT
CATH INFINITI 5FR ANG PIGTAIL (CATHETERS) ×2 IMPLANT
CATH INFINITI JR4 5F (CATHETERS) ×2 IMPLANT
CATH VISTA GUIDE 6FR XBLAD3.5 (CATHETERS) ×2 IMPLANT
DEVICE RAD COMP TR BAND LRG (VASCULAR PRODUCTS) ×2 IMPLANT
ELECT DEFIB PAD ADLT CADENCE (PAD) ×2 IMPLANT
GLIDESHEATH SLEND SS 6F .021 (SHEATH) ×2 IMPLANT
KIT ENCORE 26 ADVANTAGE (KITS) ×2 IMPLANT
KIT HEART LEFT (KITS) ×2 IMPLANT
PACK CARDIAC CATHETERIZATION (CUSTOM PROCEDURE TRAY) ×2 IMPLANT
SYR MEDRAD MARK V 150ML (SYRINGE) ×2 IMPLANT
TRANSDUCER W/STOPCOCK (MISCELLANEOUS) ×2 IMPLANT
TUBING CIL FLEX 10 FLL-RA (TUBING) ×2 IMPLANT
WIRE HI TORQ VERSACORE-J 145CM (WIRE) ×2 IMPLANT
WIRE SAFE-T 1.5MM-J .035X260CM (WIRE) ×2 IMPLANT

## 2015-03-18 NOTE — Progress Notes (Signed)
  Received call at 3:55 pm that STAT pre-CABG Dopplers are needed for patient. Ready to head up to cath lab at 4:15 pm and realized patient is already in [facility]. Paged Dr. Roxan Hockey about studies and he said they are no longer needed.  Landry Mellow, RDMS, RVT 03/18/2015 Vascular Lab

## 2015-03-18 NOTE — Brief Op Note (Addendum)
03/18/2015  7:23 PM  PATIENT:  Miguel Vazquez  74 y.o. 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  PROCEDURE:  Procedure(s):  CORONARY ARTERY BYPASS GRAFTING x 3 -LIMA to LAD -SVG to OM2 -SVG to PLB  ENDOSCOPIC HARVEST GREATER SAPHENOUS VEIN - Right Thigh to Below the Knee INTRAOPERATIVE TRANSESOPHAGEAL ECHOCARDIOGRAM (N/A)  SURGEON:  Surgeon(s) and Role:    * Melrose Nakayama, MD - Primary  PHYSICIAN ASSISTANT: Ellwood Handler PA-C  ANESTHESIA:   general  EBL:  Total I/O In: -  Out: 20 [Urine:75]  BLOOD ADMINISTERED: CELLSAVER  DRAINS: Mediastinal chest drains, Left Pleural Chest Tube   COUNTS:  YES  PLAN OF CARE: Admit to inpatient   PATIENT DISPOSITION:  ICU - intubated and hemodynamically stable.   Delay start of Pharmacological VTE agent (>24hrs) due to surgical blood loss or risk of bleeding: yes  FINDINGS: XC= 70 min CPB= 148 min- off on epi, norepi, dopamine and vasopressin  TEE- severe LV dysfunction EF~20%, moderate (2+) MR prebypass           Improved LV wall motion EF ~30-35%, with inotropic support postbypass LIMA good quality, vein fair Targets good quality

## 2015-03-18 NOTE — ED Notes (Addendum)
Per pt, states he had hernia surgery on Tuesday-states today he is confused, complaining of chest pain-tremulous-has not taken any narcotics today-last drink 4 days ago

## 2015-03-18 NOTE — OR Nursing (Signed)
20:45 - 45 minute call to SICU, 21:20 - 2nd call to SICU

## 2015-03-18 NOTE — Anesthesia Procedure Notes (Signed)
Procedures Procedures: Right IJ Gordy Councilman Catheter [id number]: The patient was identified and consent obtained.  TO was performed, and full barrier precautions were used.  The skin was anesthetized with lidocaine-4cc plain with 25g needle.  Once the vein was located with the 22 ga. needle using ultrasound guidance , the wire was inserted into the vein.  The wire location was confirmed with ultrasound.  The tissue was dilated and the 8.5 [place] cordis catheter was carefully inserted. Afterwards Gordy Councilman catheter was inserted. PA catheter at 48cm.  The patient tolerated the procedure well.

## 2015-03-18 NOTE — Transfer of Care (Signed)
Immediate Anesthesia Transfer of Care Note  Patient: Miguel Vazquez  Procedure(s) Performed: Procedure(s): CORONARY ARTERY BYPASS GRAFTING (CABG) x  three, using left internal mammary artery and right leg greater saphenous vein harvested endoscopically (N/A) INTRAOPERATIVE TRANSESOPHAGEAL ECHOCARDIOGRAM (N/A)  Patient Location: SICU  Anesthesia Type:General  Level of Consciousness: Patient remains intubated per anesthesia plan  Airway & Oxygen Therapy: Patient remains intubated per anesthesia plan and Patient placed on Ventilator (see vital sign flow sheet for setting)  Post-op Assessment: Report given to RN and Post -op Vital signs reviewed and stable  Post vital signs: Reviewed and stable  Last Vitals:  Filed Vitals:   03/18/15 1540 03/18/15 1545  BP: 91/64 95/66  Pulse: 110 110  Resp: 17 16    Complications: No apparent anesthesia complications

## 2015-03-18 NOTE — ED Provider Notes (Signed)
CSN: [id number]     Arrival date & time 03/18/15  1346 History   First MD Initiated Contact with Patient 03/18/15 1414     Chief Complaint  Patient presents with  . Altered Mental Status     (Consider location/radiation/quality/duration/timing/severity/associated sxs/prior Treatment) HPI Comments: 74 year old male with past medical history including hypertension, hyperlipidemia, type 2 diabetes mellitus, abdominal aortic aneurysm status post repair who presents with confusion. History obtained with the assistance of the patient's wife. The patient had hernia repair surgery 3 days ago. Wife states that the surgery went well and he was discharged home in good condition. She states that yesterday all day he was complaining of chest pain and had a decreased appetite. This morning, he had a small amount of chest pain but it has since resolved but she did note that he has been confused and disoriented all day. He has been shaky since this morning. Currently, the patient denies any pain. No vomiting or fevers at home. No urinary symptoms or change in bowel movements. No shortness of breath.  Patient is a 74 y.o. male presenting with altered mental status. The history is provided by the patient and the spouse.  Altered Mental Status   Past Medical History  Diagnosis Date  . Hypertension   . Hyperlipidemia   . Diabetes mellitus   . Arthritis   . Hx of abdominal aortic aneurysm 1998  . Personal history of colonic polyps - adenomas 09/09/2013  . Cataract   . GERD (gastroesophageal reflux disease)    Past Surgical History  Procedure Laterality Date  . Abdominal aortic aneurysm repair  1998  . Umbilical hernia repair  1965  . Colonoscopy     Family History  Problem Relation Age of Onset  . Colon cancer Neg Hx   . Diabetes Paternal Aunt    Social History  Substance Use Topics  . Smoking status: Former Smoker -- 1.50 packs/day for 20 years    Types: Cigarettes    Quit date: 08/24/1986  .  Smokeless tobacco: Never Used  . Alcohol Use: 4.2 oz/week    7 Glasses of wine per week    Review of Systems 10 Systems reviewed and are negative for acute change except as noted in the HPI.    Allergies  Review of patient's allergies indicates no known allergies.  Home Medications   Prior to Admission medications   Medication Sig Start Date End Date Taking? Authorizing Provider  aspirin 81 MG EC tablet Take 81 mg by mouth daily.     Yes Historical Provider, MD  atorvastatin (LIPITOR) 40 MG tablet TAKE 1 TABLET (40 MG TOTAL) BY MOUTH DAILY. 11/05/14  Yes Eulas Post, MD  ibuprofen (ADVIL,MOTRIN) 200 MG tablet Take 400-800 mg by mouth every 6 (six) hours as needed for moderate pain.   Yes Historical Provider, MD  KRILL OIL PO Take 1 capsule by mouth daily.    Yes Historical Provider, MD  lisinopril-hydrochlorothiazide (PRINZIDE,ZESTORETIC) 20-12.5 MG per tablet TAKE 1 TABLET BY MOUTH DAILY. 09/20/14  Yes Eulas Post, MD  metFORMIN (GLUCOPHAGE) 500 MG tablet TAKE 2 TABLETS BY MOUTH TWICE A DAY 02/21/15  Yes Eulas Post, MD  Multiple Vitamin (MULTIVITAMIN) tablet Take 1 tablet by mouth daily. Centrium Silver once daily    Yes Historical Provider, MD  omeprazole (PRILOSEC) 40 MG capsule Take 1 capsule (40 mg total) by mouth daily before breakfast. 01/13/15  Yes Gatha Mayer, MD  ondansetron (ZOFRAN-ODT) 4 MG disintegrating tablet Take  4 mg by mouth every 4 (four) hours as needed for nausea.  03/16/15  Yes Historical Provider, MD  oxyCODONE (OXY IR/ROXICODONE) 5 MG immediate release tablet Take 5 mg by mouth every 4 (four) hours as needed for severe pain.  03/15/15  Yes Historical Provider, MD  ranitidine (ZANTAC) 300 MG tablet Take 1 tablet (300 mg total) by mouth at bedtime. 01/13/15  Yes Gatha Mayer, MD  tamsulosin (FLOMAX) 0.4 MG CAPS capsule Take 0.4 mg by mouth daily. For 10 days 12-13 to 12-23 03/15/15  Yes Historical Provider, MD  vitamin E 400 UNIT capsule Take  400 Units by mouth daily.   Yes Historical Provider, MD   BP 95/66 mmHg  Pulse 110  Resp 16  Ht 5\' 8"  (1.727 m)  Wt 163 lb (73.936 kg)  BMI 24.79 kg/m2  SpO2 92% Physical Exam  Constitutional: He appears well-developed and well-nourished. No distress.  HENT:  Head: Normocephalic and atraumatic.  Moist mucous membranes  Eyes: Conjunctivae are normal. Pupils are equal, round, and reactive to light.  Neck: Neck supple.  Cardiovascular: Normal rate, regular rhythm and normal heart sounds.   No murmur heard. Pulmonary/Chest: Effort normal and breath sounds normal.  Abdominal: Soft. Bowel sounds are normal. He exhibits no distension. There is no tenderness.  Musculoskeletal: He exhibits no edema.  Neurological: He is alert.  Fluent speech, oriented to person and place, moving all 4 extremities  Skin: Skin is warm and dry.  Psychiatric: He has a normal mood and affect. Judgment normal.  Nursing note and vitals reviewed.   ED Course  .Critical Care Performed by: Sharlett Iles Authorized by: Sharlett Iles Total critical care time: 35 minutes Critical care time was exclusive of separately billable procedures and treating other patients. Critical care was necessary to treat or prevent imminent or life-threatening deterioration of the following conditions: cardiac failure. Critical care was time spent personally by me on the following activities: development of treatment plan with patient or surrogate, discussions with consultants, examination of patient, obtaining history from patient or surrogate, ordering and performing treatments and interventions, ordering and review of laboratory studies, ordering and review of radiographic studies and review of old charts.   (including critical care time) Labs Review Labs Reviewed  COMPREHENSIVE METABOLIC PANEL - Abnormal; Notable for the following:    Sodium 133 (*)    Chloride 93 (*)    Glucose, Bld 221 (*)    BUN 61 (*)     Creatinine, Ser 2.18 (*)    AST 172 (*)    Total Bilirubin 1.5 (*)    GFR calc non Af Amer 28 (*)    GFR calc Af Amer 33 (*)    All other components within normal limits  CBC - Abnormal; Notable for the following:    WBC 14.1 (*)    RBC 3.42 (*)    Hemoglobin 11.2 (*)    HCT 33.6 (*)    All other components within normal limits  CBG MONITORING, ED - Abnormal; Notable for the following:    Glucose-Capillary 180 (*)    All other components within normal limits  I-STAT TROPOININ, ED - Abnormal; Notable for the following:    Troponin i, poc 22.76 (*)    All other components within normal limits  APTT  PROTIME-INR  URINALYSIS, ROUTINE W REFLEX MICROSCOPIC (NOT AT [facility])  I-STAT TROPOININ, ED  TYPE AND SCREEN  PREPARE RBC (CROSSMATCH)  ABO/RH    Imaging Review No results found.  I have personally reviewed and evaluated these lab results as part of my medical decision-making.   EKG Interpretation   Date/Time:  Friday March 18 2015 14:03:27 EST Ventricular Rate:  95 PR Interval:  148 QRS Duration: 101 QT Interval:  325 QTC Calculation: 408 R Axis:   -12 Text Interpretation:  Sinus rhythm Inferior infarct, acute (RCA) Consider  anterior infarct Probable RV involvement, suggest recording right  precordial leads findings concerning for acute inferior stemi, no recent  available for comparison Confirmed by Levorn Oleski MD, Jacody Beneke [phone]) on  03/18/2015 2:13:30 PM      MDM   Final diagnoses:  ST elevation myocardial infarction (STEMI), unspecified artery ([place])  Confusion    Patient presents with confusion and shakiness that began this morning in the setting of recent hernia surgery 3 days ago. Pt brought back to ED room and obtained EKG which was brought to me. EKG showed ST elevation in inferior leads w/ T wave inversions laterally, elevation in aVR, concerning posteroinferior STEMI. I immediately evaluated pt at bedside. Gave 325mg  ASA. Wife noted that the patient complained  of chest pain all day yesterday but the patient denies any chest pain currently. On exam, the patient was comfortable, awake, and oriented to person and place. Vital signs notable for heart rate 101, BP 91/58. No abdominal tenderness on exam and patient breathing comfortably on room air. No recent EKG for comparison, last EKG in system was 2001. I paged STEMI cardiologist to review EKG given pt denying any chest pain/SOB; after a few minutes w/ no response, activated a code STEMI. Discussed w/ Dr. Martinique, cardiology, who recommended heparin bolus and drip despite recent surgery given life-threatening condition. Pt transferred to cath lab at [facility].    Sharlett Iles, MD 03/18/15 [id number]

## 2015-03-18 NOTE — Consult Note (Signed)
Reason for Consult:Severe 3 vessel CAD/ STEMI Referring Physician: Dr. Martinique  Miguel Vazquez is an 74 y.o. male.  HPI:74 yo man presents with a cc/o "indigestion" since yesterday  Miguel Vazquez is a 74 yo man with multiple CRF including HTN, lipids, diabetes and PAD. He had hernia surgery 3 days ago. He noted onset of severe indigestion yesterday. His wife says he has been having a lot of problems with that recently. Today it became even more severe and unrelenting. He came to the ED and was found to be having a STEMI. He was taken emergently to the cath lab where catheterization revealed severe 3 vessel CAD with heavily calcified arteries not amenable to PCI. He was having ongoing pain in the cath lab initially but it has now eased off. His ST elevation has improved.  Past Medical History  Diagnosis Date  . Hypertension   . Hyperlipidemia   . Diabetes mellitus   . Arthritis   . Hx of abdominal aortic aneurysm 1998  . Personal history of colonic polyps - adenomas 09/09/2013  . Cataract   . GERD (gastroesophageal reflux disease)     Past Surgical History  Procedure Laterality Date  . Abdominal aortic aneurysm repair  1998  . Umbilical hernia repair  1965  . Colonoscopy      Family History  Problem Relation Age of Onset  . Colon cancer Neg Hx   . Diabetes Paternal Aunt     Social History:  reports that he quit smoking about 28 years ago. His smoking use included Cigarettes. He has a 30 pack-year smoking history. He has never used smokeless tobacco. He reports that he drinks about 4.2 oz of alcohol per week. He reports that he does not use illicit drugs.  Allergies: No Known Allergies  Medications:  Prior to Admission:  Prescriptions prior to admission  Medication Sig Dispense Refill Last Dose  . aspirin 81 MG EC tablet Take 81 mg by mouth daily.     03/17/2015 at Unknown time  . atorvastatin (LIPITOR) 40 MG tablet TAKE 1 TABLET (40 MG TOTAL) BY MOUTH DAILY. 90 tablet 1 03/17/2015  at Unknown time  . ibuprofen (ADVIL,MOTRIN) 200 MG tablet Take 400-800 mg by mouth every 6 (six) hours as needed for moderate pain.   03/17/2015 at Unknown time  . KRILL OIL PO Take 1 capsule by mouth daily.    03/17/2015 at Unknown time  . lisinopril-hydrochlorothiazide (PRINZIDE,ZESTORETIC) 20-12.5 MG per tablet TAKE 1 TABLET BY MOUTH DAILY. 90 tablet 1 03/17/2015 at Unknown time  . metFORMIN (GLUCOPHAGE) 500 MG tablet TAKE 2 TABLETS BY MOUTH TWICE A DAY 120 tablet 1 03/18/2015 at Unknown time  . Multiple Vitamin (MULTIVITAMIN) tablet Take 1 tablet by mouth daily. Centrium Silver once daily    03/17/2015 at Unknown time  . omeprazole (PRILOSEC) 40 MG capsule Take 1 capsule (40 mg total) by mouth daily before breakfast. 30 capsule 2 03/18/2015 at Unknown time  . ondansetron (ZOFRAN-ODT) 4 MG disintegrating tablet Take 4 mg by mouth every 4 (four) hours as needed for nausea.    03/17/2015 at Unknown time  . oxyCODONE (OXY IR/ROXICODONE) 5 MG immediate release tablet Take 5 mg by mouth every 4 (four) hours as needed for severe pain.   0 03/17/2015 at Unknown time  . ranitidine (ZANTAC) 300 MG tablet Take 1 tablet (300 mg total) by mouth at bedtime. 30 tablet 2 03/17/2015 at Unknown time  . tamsulosin (FLOMAX) 0.4 MG CAPS capsule Take 0.4 mg  by mouth daily. For 10 days 12-13 to 12-23  1 03/18/2015 at Unknown time  . vitamin E 400 UNIT capsule Take 400 Units by mouth daily.   03/17/2015 at Unknown time    Results for orders placed or performed during the hospital encounter of 03/18/15 (from the past 48 hour(s))  CBG monitoring, ED     Status: Abnormal   Collection Time: 03/18/15  2:06 PM  Result Value Ref Range   Glucose-Capillary 180 (H) 65 - 99 mg/dL  Comprehensive metabolic panel     Status: Abnormal   Collection Time: 03/18/15  2:23 PM  Result Value Ref Range   Sodium 133 (L) 135 - 145 mmol/L   Potassium 4.7 3.5 - 5.1 mmol/L   Chloride 93 (L) 101 - 111 mmol/L   CO2 26 22 - 32 mmol/L    Glucose, Bld 221 (H) 65 - 99 mg/dL   BUN 61 (H) 6 - 20 mg/dL   Creatinine, Ser 2.18 (H) 0.61 - 1.24 mg/dL   Calcium 9.5 8.9 - 10.3 mg/dL   Total Protein 8.1 6.5 - 8.1 g/dL   Albumin 4.3 3.5 - 5.0 g/dL   AST 172 (H) 15 - 41 U/L   ALT 41 17 - 63 U/L   Alkaline Phosphatase 63 38 - 126 U/L   Total Bilirubin 1.5 (H) 0.3 - 1.2 mg/dL   GFR calc non Af Amer 28 (L) >60 mL/min   GFR calc Af Amer 33 (L) >60 mL/min    Comment: (NOTE) The eGFR has been calculated using the CKD EPI equation. This calculation has not been validated in all clinical situations. eGFR's persistently <60 mL/min signify possible Chronic Kidney Disease.    Anion gap 14 5 - 15  CBC     Status: Abnormal   Collection Time: 03/18/15  2:23 PM  Result Value Ref Range   WBC 14.1 (H) 4.0 - 10.5 K/uL   RBC 3.42 (L) 4.22 - 5.81 MIL/uL   Hemoglobin 11.2 (L) 13.0 - 17.0 g/dL   HCT 33.6 (L) 39.0 - 52.0 %   MCV 98.2 78.0 - 100.0 fL   MCH 32.7 26.0 - 34.0 pg   MCHC 33.3 30.0 - 36.0 g/dL   RDW 13.3 11.5 - 15.5 %   Platelets 188 150 - 400 K/uL  I-stat troponin, ED (not at [facility])     Status: Abnormal   Collection Time: 03/18/15  2:26 PM  Result Value Ref Range   Troponin i, poc 22.76 (HH) 0.00 - 0.08 ng/mL   Comment NOTIFIED PHYSICIAN    Comment 3            Comment: Due to the release kinetics of cTnI, a negative result within the first hours of the onset of symptoms does not rule out myocardial infarction with certainty. If myocardial infarction is still suspected, repeat the test at appropriate intervals.   APTT     Status: None   Collection Time: 03/18/15  2:34 PM  Result Value Ref Range   aPTT 31 24 - 37 seconds  Protime-INR     Status: None   Collection Time: 03/18/15  2:34 PM  Result Value Ref Range   Prothrombin Time 13.5 11.6 - 15.2 seconds   INR 1.01 0.00 - 1.49    No results found.  ROS Blood pressure 95/66, pulse 110, resp. rate 16, height 5' 8"  (1.727 m), weight 163 lb (73.936 kg), SpO2 92  %. Physical Exam  Vitals reviewed. Constitutional: He is oriented  to person, place, and time. He appears well-developed and well-nourished. He appears distressed.  74 yo man on cath table, anxious  HENT:  Head: Normocephalic and atraumatic.  Eyes: Conjunctivae and EOM are normal. No scleral icterus.  Neck:  No bruits  Cardiovascular:  Murmur (2/6 systolic) heard. Tachycardic Femoral pulses 1+ bilaterally  Respiratory: Effort normal and breath sounds normal. He has no wheezes.  GI: Soft. There is no tenderness.  Neurological: He is alert and oriented to person, place, and time. No cranial nerve deficit.  Skin: Skin is warm and dry. He is not diaphoretic. There is pallor.    Assessment/Plan: 73 yo man who presented late in the course of a STEMI who has severe 3 vessel CAD. He needs emergent revascularization for myocardial preservation and survival benefit.  He is a high risk candidate due to late presentation, severe LV dysfunction and acute renal failure.  I discussed the indications, risks, benefits and alternatives(medical therapy) with the patient and his family separately. They understand the high risk nature of the procedure. They understand the risks include but are not limited to death, MI, DVT, PE, bleeding transfusion, infection, renal failure, respiratory failure, as well as other unforeseeable complications. He agrees to proceed.  The OR has been notified.  Melrose Nakayama 03/18/2015, 3:53 PM

## 2015-03-18 NOTE — Progress Notes (Signed)
Echocardiogram Echocardiogram Transesophageal has been performed.  Tresa Res 03/18/2015, 5:34 PM

## 2015-03-18 NOTE — Progress Notes (Signed)
   03/18/15 1700  Clinical Encounter Type  Visited With Family;Patient not available  Visit Type Initial;Patient in surgery  Spiritual Encounters  Spiritual Needs Emotional  [place] paged to support and comfort family while pt in surgery.  [place] will follow-up. 5:44 PM Gwynn Burly

## 2015-03-18 NOTE — ED Notes (Signed)
Bed: WA02 Expected date:  Expected time:  Means of arrival:  Comments: Triage  

## 2015-03-18 NOTE — H&P (Signed)
Physician History and Physical    Patient ID: Miguel Vazquez MRN: [id number] DOB/AGE: 1940-09-20 74 y.o. Admit date: 03/18/2015  Primary Care Physician: Eulas Post, MD Primary Cardiologist Ronav Furney Martinique MD  HPI: Miguel Vazquez is a 74 yo WM who presented to the [facility] ED today with 24 hour history of severe indigestion. He reports problems with chronic indigestion and has undergone GI evaluation. 3 days ago he had a laparoscopic inguinal hernia repair as an outpatient. He states this went well. Wife noted some confusion earlier today and with his unrelenting indigestion brought him to the ED. On arrival Ecg showed ST elevation in the inferior leads with deep ST depression in the anterior leads consistent with an inferoposterior infarct. Troponin was elevated at 22. He was transported emergently to the cath lab.  He has a history of DM type 2 on metformin. He also has a history of HTN, HL and PAD with remote AAA repair in 2001.   Review of systems complete and found to be negative unless listed above  Past Medical History  Diagnosis Date  . Hypertension   . Hyperlipidemia   . Diabetes mellitus   . Arthritis   . Hx of abdominal aortic aneurysm 1998  . Personal history of colonic polyps - adenomas 09/09/2013  . Cataract   . GERD (gastroesophageal reflux disease)     Family History  Problem Relation Age of Onset  . Colon cancer Neg Hx   . Diabetes Paternal Aunt     Social History   Social History  . Marital Status: Married    Spouse Name: [name]  . Number of Children: 3  . Years of Education: N/A   Occupational History  . Not on file.   Social History Main Topics  . Smoking status: Former Smoker -- 1.50 packs/day for 20 years    Types: Cigarettes    Quit date: 08/24/1986  . Smokeless tobacco: Never Used  . Alcohol Use: 4.2 oz/week    7 Glasses of wine per week  . Drug Use: No  . Sexual Activity: Not on file   Other Topics Concern  . Not on file   Social History Narrative     Past Surgical History  Procedure Laterality Date  . Abdominal aortic aneurysm repair  1998  . Umbilical hernia repair  1965  . Colonoscopy       Prescriptions prior to admission  Medication Sig Dispense Refill Last Dose  . aspirin 81 MG EC tablet Take 81 mg by mouth daily.     03/17/2015 at Unknown time  . atorvastatin (LIPITOR) 40 MG tablet TAKE 1 TABLET (40 MG TOTAL) BY MOUTH DAILY. 90 tablet 1 03/17/2015 at Unknown time  . ibuprofen (ADVIL,MOTRIN) 200 MG tablet Take 400-800 mg by mouth every 6 (six) hours as needed for moderate pain.   03/17/2015 at Unknown time  . KRILL OIL PO Take 1 capsule by mouth daily.    03/17/2015 at Unknown time  . lisinopril-hydrochlorothiazide (PRINZIDE,ZESTORETIC) 20-12.5 MG per tablet TAKE 1 TABLET BY MOUTH DAILY. 90 tablet 1 03/17/2015 at Unknown time  . metFORMIN (GLUCOPHAGE) 500 MG tablet TAKE 2 TABLETS BY MOUTH TWICE A DAY 120 tablet 1 03/18/2015 at Unknown time  . Multiple Vitamin (MULTIVITAMIN) tablet Take 1 tablet by mouth daily. Centrium Silver once daily    03/17/2015 at Unknown time  . omeprazole (PRILOSEC) 40 MG capsule Take 1 capsule (40 mg total) by mouth daily before breakfast. 30 capsule 2 03/18/2015 at Unknown time  .  ondansetron (ZOFRAN-ODT) 4 MG disintegrating tablet Take 4 mg by mouth every 4 (four) hours as needed for nausea.    03/17/2015 at Unknown time  . oxyCODONE (OXY IR/ROXICODONE) 5 MG immediate release tablet Take 5 mg by mouth every 4 (four) hours as needed for severe pain.   0 03/17/2015 at Unknown time  . ranitidine (ZANTAC) 300 MG tablet Take 1 tablet (300 mg total) by mouth at bedtime. 30 tablet 2 03/17/2015 at Unknown time  . tamsulosin (FLOMAX) 0.4 MG CAPS capsule Take 0.4 mg by mouth daily. For 10 days 12-13 to 12-23  1 03/18/2015 at Unknown time  . vitamin E 400 UNIT capsule Take 400 Units by mouth daily.   03/17/2015 at Unknown time    Physical Exam: Blood pressure 95/66, pulse 110, resp. rate 16, height 5\' 8"  (1.727  m), weight 73.936 kg (163 lb), SpO2 92 %. Current Weight  03/18/15 73.936 kg (163 lb)  01/18/15 78.019 kg (172 lb)  01/13/15 78.019 kg (172 lb)    GENERAL:  Elderly WM in modest distress. HEENT:  PERRL, EOMI, sclera are clear. Oropharynx is clear. NECK:  No jugular venous distention, no carotid bruit.  No thyromegaly or adenopathy LUNGS:  Clear to auscultation bilaterally CHEST:  Unremarkable HEART:  RRR,  PMI not displaced or sustained,S1 and S2 within normal limits, no S3, no S4: no clicks, no rubs, no murmurs ABD:  Soft, nontender. BS +, no masses or bruits. No hepatomegaly, no splenomegaly. Evidence of recent laparoscopic incisions- healing with minimal bruising.  EXT:  1+ pulses throughout- pulses thready, no edema, no cyanosis no clubbing SKIN:  Warm and dry.  No rashes NEURO:  Alert and oriented x 3. Cranial nerves II through XII intact. PSYCH:  Cognitively intact    Labs:   Lab Results  Component Value Date   WBC 14.1* 03/18/2015   HGB 11.2* 03/18/2015   HCT 33.6* 03/18/2015   MCV 98.2 03/18/2015   PLT 188 03/18/2015    Recent Labs Lab 03/18/15 1423  NA 133*  K 4.7  CL 93*  CO2 26  BUN 61*  CREATININE 2.18*  CALCIUM 9.5  PROT 8.1  BILITOT 1.5*  ALKPHOS 63  ALT 41  AST 172*  GLUCOSE 221*   No results found for: CKMB, CKMBINDEX, TROPONINI Lab Results  Component Value Date   CHOL 165 05/21/2014   CHOL 149 05/21/2013   CHOL 140 05/21/2012   Lab Results  Component Value Date   HDL 38.90* 05/21/2014   HDL 42.50 05/21/2013   HDL 37.80* 05/21/2012   Lab Results  Component Value Date   LDLCALC 92 05/21/2014   LDLCALC 63 05/21/2012   LDLCALC 69 08/20/2011   Lab Results  Component Value Date   TRIG 173.0* 05/21/2014   TRIG 210.0* 05/21/2013   TRIG 195.0* 05/21/2012   Lab Results  Component Value Date   CHOLHDL 4 05/21/2014   CHOLHDL 4 05/21/2013   CHOLHDL 4 05/21/2012   Lab Results  Component Value Date   LDLDIRECT 84.1 05/21/2013    No  results found for: PROBNP No results found for: TSH Lab Results  Component Value Date   HGBA1C 6.8* 12/08/2014    Radiology: No results found.  EKG: as noted in HPI  ASSESSMENT AND PLAN:   1. Inferio posterior STEMI 2. Acute renal failure. 3. Recent laparoscopic hernia repair. 4. HTN 5. Hyperlipidemia 6. DM type 2 7. History of AAA repair   Plan: patient taken emergently for cardiac cath.  This demonstrates critical left main and 3 vessel CAD. LV function is poor. He has some residual "indigestion" consistent with ongoing ischemia. CT surgery consulted with plans to perform emergent CABG.   Signed: Preciliano Castell Martinique, [place]  03/18/2015, 4:06 PM

## 2015-03-18 NOTE — Anesthesia Preprocedure Evaluation (Signed)
Anesthesia Evaluation  Patient identified by MRN, date of birth, ID band Patient awake    Reviewed: Allergy & Precautions, NPO status , Patient's Chart, lab work & pertinent test results, reviewed documented beta blocker date and time   Airway        Dental  (+) Dental Advisory Given   Pulmonary former smoker,           Cardiovascular hypertension, Pt. on medications + Past MI       Neuro/Psych    GI/Hepatic GERD  ,  Endo/Other  diabetes  Renal/GU      Musculoskeletal  (+) Arthritis ,   Abdominal   Peds  Hematology   Anesthesia Other Findings   Reproductive/Obstetrics                             Anesthesia Physical Anesthesia Plan  ASA: IV and emergent  Anesthesia Plan: General   Post-op Pain Management:    Induction: Intravenous  Airway Management Planned: Oral ETT  Additional Equipment: Arterial line, PA Cath, 3D TEE, TEE, CVP and Ultrasound Guidance Line Placement  Intra-op Plan:   Post-operative Plan: Post-operative intubation/ventilation  Informed Consent: I have reviewed the patients History and Physical, chart, labs and discussed the procedure including the risks, benefits and alternatives for the proposed anesthesia with the patient or authorized representative who has indicated his/her understanding and acceptance.   Dental advisory given  Plan Discussed with: Surgeon and Anesthesiologist  Anesthesia Plan Comments:         Anesthesia Quick Evaluation

## 2015-03-19 ENCOUNTER — Inpatient Hospital Stay (HOSPITAL_COMMUNITY): Payer: Medicare HMO

## 2015-03-19 LAB — CK TOTAL AND CKMB (NOT AT ARMC): Total CK: 1751 U/L — ABNORMAL HIGH (ref 49–397)

## 2015-03-19 LAB — POCT I-STAT 3, ART BLOOD GAS (G3+)
ACID-BASE DEFICIT: 4 mmol/L — AB (ref 0.0–2.0)
Acid-base deficit: 3 mmol/L — ABNORMAL HIGH (ref 0.0–2.0)
BICARBONATE: 21 meq/L (ref 20.0–24.0)
BICARBONATE: 22.2 meq/L (ref 20.0–24.0)
O2 SAT: 98 %
O2 Saturation: 97 %
PH ART: 7.352 (ref 7.350–7.450)
PO2 ART: 103 mmHg — AB (ref 80.0–100.0)
PO2 ART: 87 mmHg (ref 80.0–100.0)
Patient temperature: 35.8
Patient temperature: 37
TCO2: 22 mmol/L (ref 0–100)
TCO2: 23 mmol/L (ref 0–100)
pCO2 arterial: 39.5 mmHg (ref 35.0–45.0)
pCO2 arterial: 38.8 mmHg (ref 35.0–45.0)
pH, Arterial: 7.342 — ABNORMAL LOW (ref 7.350–7.450)

## 2015-03-19 LAB — CBC
HEMATOCRIT: 24.1 % — AB (ref 39.0–52.0)
HEMATOCRIT: 25.1 % — AB (ref 39.0–52.0)
HEMOGLOBIN: 8.3 g/dL — AB (ref 13.0–17.0)
Hemoglobin: 8.4 g/dL — ABNORMAL LOW (ref 13.0–17.0)
MCH: 32.4 pg (ref 26.0–34.0)
MCH: 31.3 pg (ref 26.0–34.0)
MCHC: 34.4 g/dL (ref 30.0–36.0)
MCHC: 33.5 g/dL (ref 30.0–36.0)
MCV: 94.1 fL (ref 78.0–100.0)
MCV: 93.7 fL (ref 78.0–100.0)
Platelets: 93 10*3/uL — ABNORMAL LOW (ref 150–400)
Platelets: 103 10*3/uL — ABNORMAL LOW (ref 150–400)
RBC: 2.56 MIL/uL — AB (ref 4.22–5.81)
RBC: 2.68 MIL/uL — AB (ref 4.22–5.81)
RDW: 14.5 % (ref 11.5–15.5)
RDW: 14.8 % (ref 11.5–15.5)
WBC: 11.4 10*3/uL — AB (ref 4.0–10.5)
WBC: 10.1 10*3/uL (ref 4.0–10.5)

## 2015-03-19 LAB — POCT I-STAT, CHEM 8
BUN: 38 mg/dL — AB (ref 6–20)
BUN: 36 mg/dL — AB (ref 6–20)
CALCIUM ION: 1.24 mmol/L (ref 1.13–1.30)
CHLORIDE: 101 mmol/L (ref 101–111)
CREATININE: 1.4 mg/dL — AB (ref 0.61–1.24)
Calcium, Ion: 1.18 mmol/L (ref 1.13–1.30)
Chloride: 103 mmol/L (ref 101–111)
Creatinine, Ser: 1.5 mg/dL — ABNORMAL HIGH (ref 0.61–1.24)
GLUCOSE: 115 mg/dL — AB (ref 65–99)
Glucose, Bld: 98 mg/dL (ref 65–99)
HEMATOCRIT: 23 % — AB (ref 39.0–52.0)
HEMATOCRIT: 25 % — AB (ref 39.0–52.0)
Hemoglobin: 7.8 g/dL — ABNORMAL LOW (ref 13.0–17.0)
Hemoglobin: 8.5 g/dL — ABNORMAL LOW (ref 13.0–17.0)
POTASSIUM: 4.1 mmol/L (ref 3.5–5.1)
Potassium: 4.1 mmol/L (ref 3.5–5.1)
SODIUM: 133 mmol/L — AB (ref 135–145)
Sodium: 137 mmol/L (ref 135–145)
TCO2: 21 mmol/L (ref 0–100)
TCO2: 21 mmol/L (ref 0–100)

## 2015-03-19 LAB — MAGNESIUM
MAGNESIUM: 2.6 mg/dL — AB (ref 1.7–2.4)
Magnesium: 2.3 mg/dL (ref 1.7–2.4)

## 2015-03-19 LAB — MRSA PCR SCREENING: MRSA by PCR: NEGATIVE

## 2015-03-19 LAB — BASIC METABOLIC PANEL
ANION GAP: 8 (ref 5–15)
BUN: 41 mg/dL — ABNORMAL HIGH (ref 6–20)
CO2: 22 mmol/L (ref 22–32)
Calcium: 8.1 mg/dL — ABNORMAL LOW (ref 8.9–10.3)
Chloride: 105 mmol/L (ref 101–111)
Creatinine, Ser: 1.52 mg/dL — ABNORMAL HIGH (ref 0.61–1.24)
GFR calc non Af Amer: 44 mL/min — ABNORMAL LOW (ref >60)
GFR, EST AFRICAN AMERICAN: 51 mL/min — AB (ref >60)
GLUCOSE: 139 mg/dL — AB (ref 65–99)
POTASSIUM: 4.3 mmol/L (ref 3.5–5.1)
Sodium: 135 mmol/L (ref 135–145)

## 2015-03-19 LAB — GLUCOSE, CAPILLARY
GLUCOSE-CAPILLARY: 106 mg/dL — AB (ref 65–99)
GLUCOSE-CAPILLARY: 89 mg/dL (ref 65–99)
GLUCOSE-CAPILLARY: 109 mg/dL — AB (ref 65–99)
GLUCOSE-CAPILLARY: 82 mg/dL (ref 65–99)
Glucose-Capillary: 103 mg/dL — ABNORMAL HIGH (ref 65–99)
Glucose-Capillary: 116 mg/dL — ABNORMAL HIGH (ref 65–99)
Glucose-Capillary: 118 mg/dL — ABNORMAL HIGH (ref 65–99)
Glucose-Capillary: 134 mg/dL — ABNORMAL HIGH (ref 65–99)
Glucose-Capillary: 128 mg/dL — ABNORMAL HIGH (ref 65–99)
Glucose-Capillary: 121 mg/dL — ABNORMAL HIGH (ref 65–99)
Glucose-Capillary: 112 mg/dL — ABNORMAL HIGH (ref 65–99)
Glucose-Capillary: 119 mg/dL — ABNORMAL HIGH (ref 65–99)
Glucose-Capillary: 125 mg/dL — ABNORMAL HIGH (ref 65–99)
Glucose-Capillary: 133 mg/dL — ABNORMAL HIGH (ref 65–99)
Glucose-Capillary: 99 mg/dL (ref 65–99)

## 2015-03-19 LAB — CREATININE, SERUM
Creatinine, Ser: 1.7 mg/dL — ABNORMAL HIGH (ref 0.61–1.24)
GFR calc non Af Amer: 38 mL/min — ABNORMAL LOW (ref >60)
GFR, EST AFRICAN AMERICAN: 44 mL/min — AB (ref >60)

## 2015-03-19 LAB — TROPONIN I: Troponin I: >65 ng/mL (ref <0.031)

## 2015-03-19 IMAGING — CR DG CHEST 1V PORT
2 series · 2 of 2 positions shown · non-contrast
Comparison: [DATE]

CLINICAL DATA: 74-year-old male status post CABG.

EXAM:
PORTABLE CHEST 1 VIEW

[AP (1 of 2)]
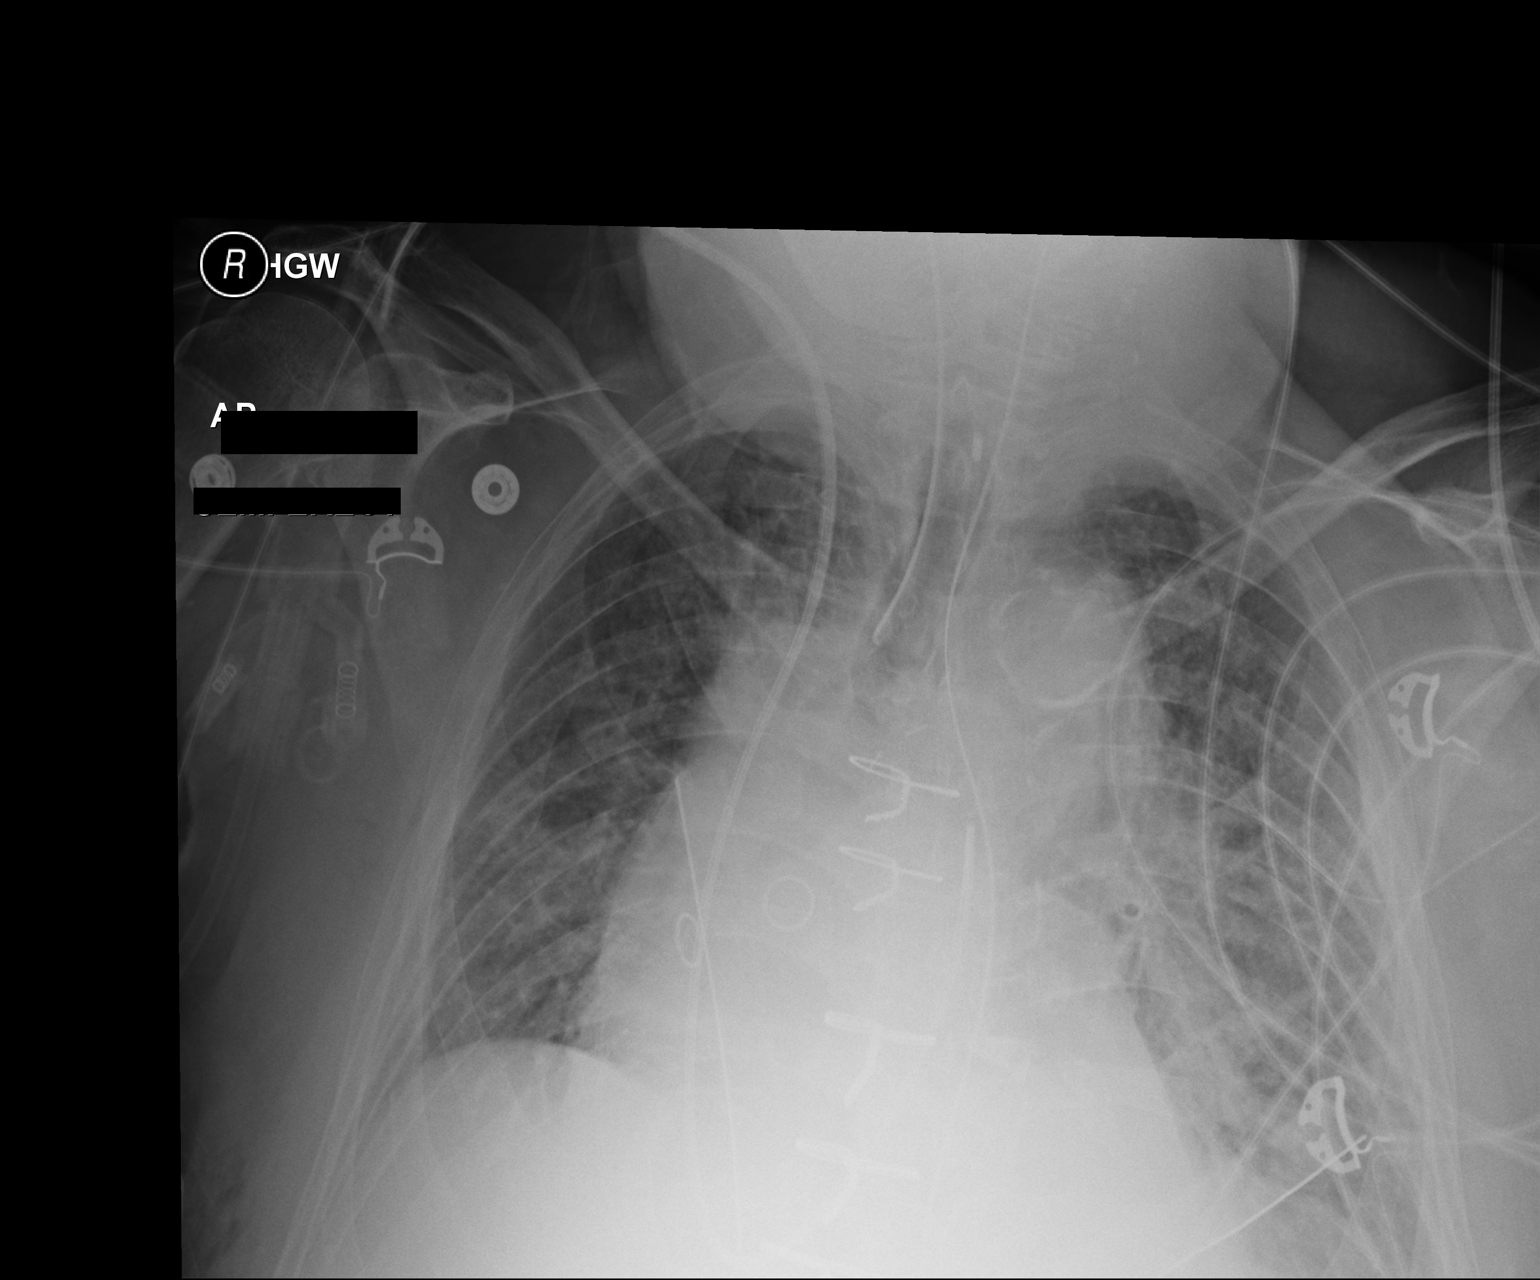

[AP (2 of 2)]
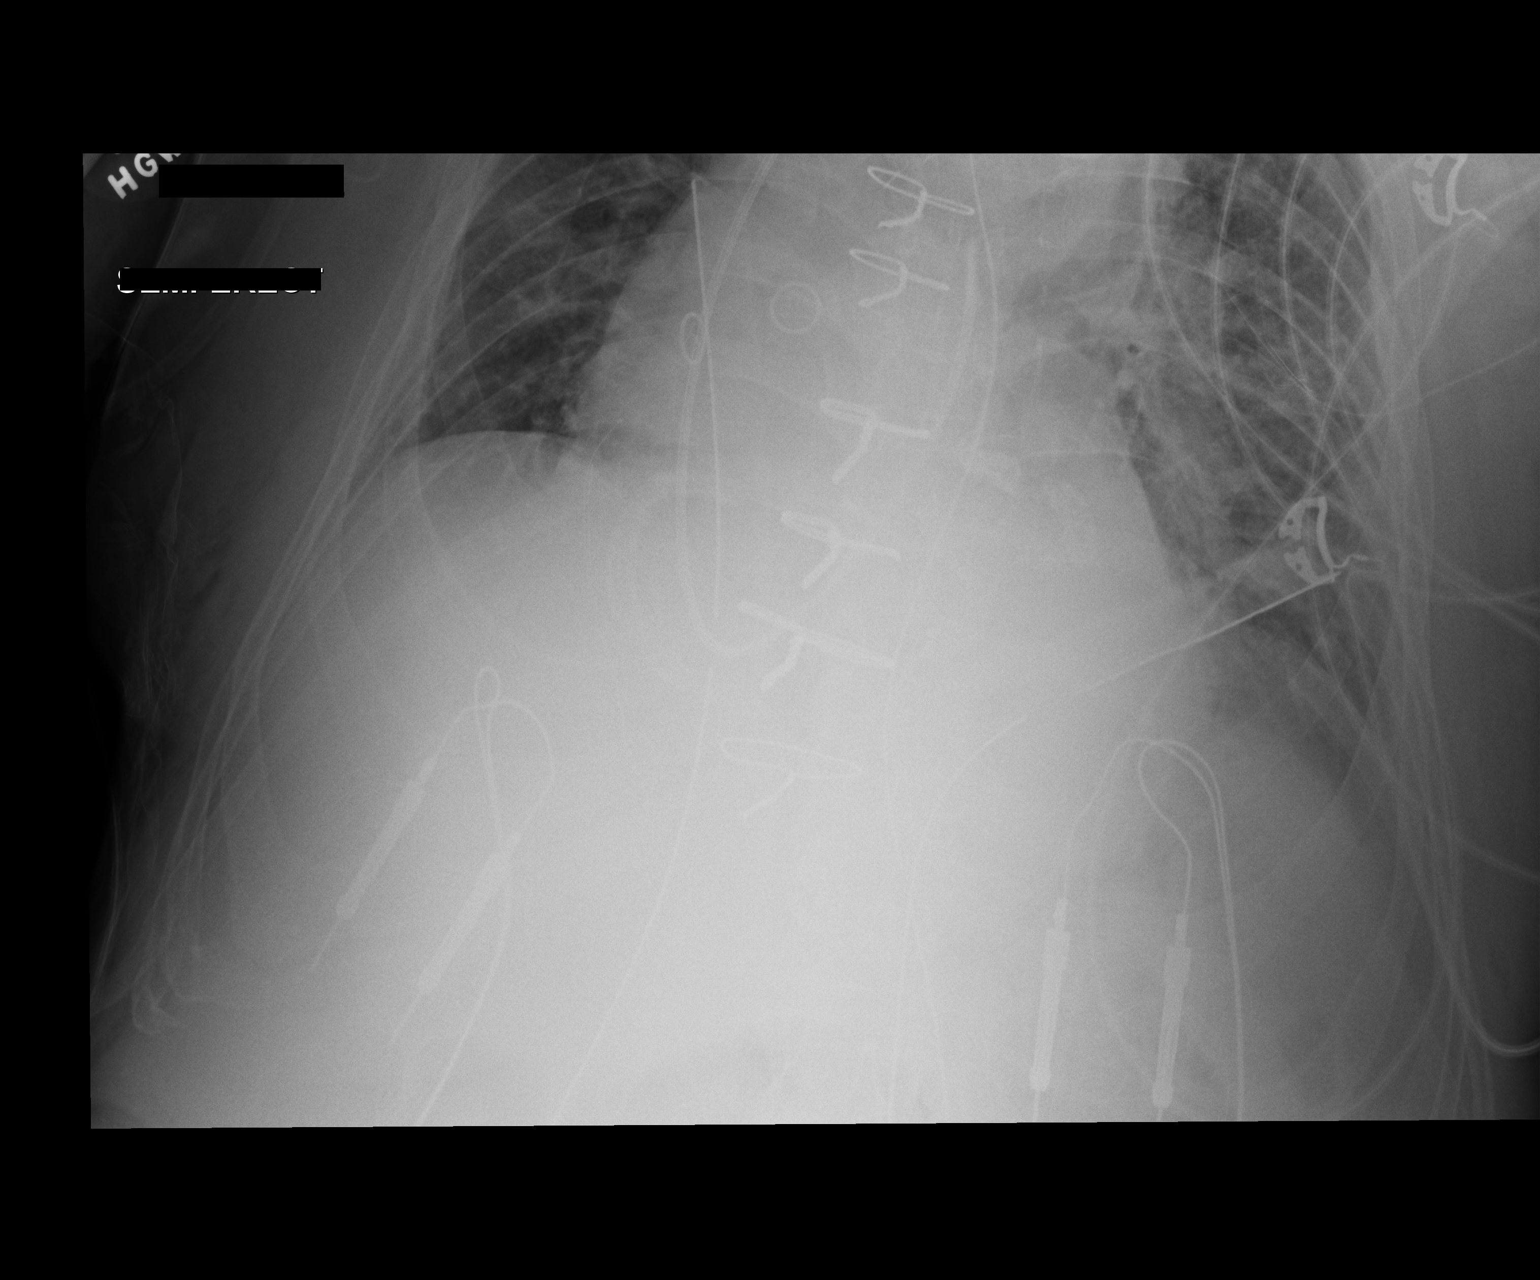

[2 of 2 positions shown; findings below may reference images not displayed]

FINDINGS: An endotracheal tube with tip 3 cm above the carina, NG tube with
tip off the field of view, Swan-Ganz catheter with tip overlying the
main pulmonary artery, and mediastinal/ thoracostomy tubes
identified.

Cardiomegaly, CABG changes, pulmonary vascular congestion,
interstitial edema and left basilar atelectasis again noted.

There is no evidence of pneumothorax.
IMPRESSION: Unchanged appearance of the chest with support apparatus as
described and interstitial pulmonary edema.

## 2015-03-19 MED ORDER — SIMVASTATIN 20 MG PO TABS: 20.0000 mg | ORAL_TABLET | Freq: Every day | ORAL | Status: DC

## 2015-03-19 MED ORDER — LIDOCAINE HCL (CARDIAC) 20 MG/ML IV SOLN
50.0000 mg | Freq: Once | INTRAVENOUS | Status: AC
  Administered 2015-03-19: 50 mg via INTRAVENOUS

## 2015-03-19 MED ORDER — AMIODARONE HCL IN DEXTROSE 360-4.14 MG/200ML-% IV SOLN
60.0000 mg/h | INTRAVENOUS | Status: AC
  Administered 2015-03-19 (×2): 60 mg/h via INTRAVENOUS
  Filled 2015-03-19 (×2): qty 200

## 2015-03-19 MED ORDER — DEXTROSE 5 % IV SOLN
8.0000 mg/h | INTRAVENOUS | Status: DC
  Administered 2015-03-19: 8 mg/h via INTRAVENOUS
  Administered 2015-03-20 – 2015-03-22 (×3): 10 mg/h via INTRAVENOUS
  Administered 2015-03-23: 8 mg/h via INTRAVENOUS
  Filled 2015-03-19 (×9): qty 25

## 2015-03-19 MED ORDER — NOREPINEPHRINE BITARTRATE 1 MG/ML IV SOLN
0.0000 ug/min | INTRAVENOUS | Status: DC
  Administered 2015-03-19: 22 ug/min via INTRAVENOUS
  Administered 2015-03-20: 18 ug/min via INTRAVENOUS
  Administered 2015-03-21 (×2): 2 ug/min via INTRAVENOUS
  Filled 2015-03-19 (×4): qty 16

## 2015-03-19 MED ORDER — CHLORHEXIDINE GLUCONATE 0.12% ORAL RINSE (MEDLINE KIT)
15.0000 mL | Freq: Two times a day (BID) | OROMUCOSAL | Status: DC
  Administered 2015-03-19 – 2015-03-21 (×5): 15 mL via OROMUCOSAL

## 2015-03-19 MED ORDER — CALCIUM CHLORIDE 10 % IV SOLN
1.0000 g | Freq: Once | INTRAVENOUS | Status: AC
  Administered 2015-03-19: 1 g via INTRAVENOUS

## 2015-03-19 MED ORDER — ATORVASTATIN CALCIUM 40 MG PO TABS
40.0000 mg | ORAL_TABLET | Freq: Every day | ORAL | Status: DC
  Administered 2015-03-19 – 2015-03-25 (×7): 40 mg via ORAL
  Filled 2015-03-19 (×7): qty 1

## 2015-03-19 MED ORDER — ALBUMIN HUMAN 5 % IV SOLN
12.5000 g | Freq: Once | INTRAVENOUS | Status: AC
  Administered 2015-03-19: 12.5 g via INTRAVENOUS

## 2015-03-19 MED ORDER — ENOXAPARIN SODIUM 40 MG/0.4ML ~~LOC~~ SOLN
40.0000 mg | SUBCUTANEOUS | Status: DC
  Administered 2015-03-19 – 2015-03-21 (×3): 40 mg via SUBCUTANEOUS
  Filled 2015-03-19 (×3): qty 0.4

## 2015-03-19 MED ORDER — ANTISEPTIC ORAL RINSE SOLUTION (CORINZ)
7.0000 mL | Freq: Four times a day (QID) | OROMUCOSAL | Status: DC
  Administered 2015-03-19 – 2015-03-21 (×9): 7 mL via OROMUCOSAL

## 2015-03-19 MED ORDER — AMIODARONE HCL IN DEXTROSE 360-4.14 MG/200ML-% IV SOLN
30.0000 mg/h | INTRAVENOUS | Status: DC
  Administered 2015-03-19 – 2015-03-23 (×9): 30 mg/h via INTRAVENOUS
  Filled 2015-03-19 (×8): qty 200

## 2015-03-19 MED ORDER — MILRINONE IN DEXTROSE 20 MG/100ML IV SOLN
0.3000 ug/kg/min | INTRAVENOUS | Status: DC
  Administered 2015-03-20: 0.3 ug/kg/min via INTRAVENOUS
  Filled 2015-03-19: qty 100

## 2015-03-19 MED ORDER — AMIODARONE LOAD VIA INFUSION
150.0000 mg | Freq: Once | INTRAVENOUS | Status: AC
  Administered 2015-03-19: 150 mg via INTRAVENOUS
  Filled 2015-03-19: qty 83.34

## 2015-03-19 NOTE — Progress Notes (Addendum)
MEDICATION RELATED CONSULT NOTE - INITIAL   Pharmacy Consult for amiodarone DDI check  No Known Allergies  Patient Measurements: Height: 5\' 8"  (172.7 cm) Weight: 189 lb 2.5 oz (85.8 kg) IBW/kg (Calculated) : 68.4  Vital Signs: Temp: 99.3 F (37.4 C) (12/17 0930) Temp Source: Core (Comment) (12/17 0400) BP: 83/60 mmHg (12/17 0900) Pulse Rate: 110 (12/17 0930)  Medications:  Scheduled:  . acetaminophen  1,000 mg Oral 4 times per day   Or  . acetaminophen (TYLENOL) oral liquid 160 mg/5 mL  1,000 mg Per Tube 4 times per day  . amiodarone  150 mg Intravenous Once  . aspirin EC  325 mg Oral Daily   Or  . aspirin  324 mg Per Tube Daily  . atorvastatin  40 mg Oral q1800  . bisacodyl  10 mg Oral Daily   Or  . bisacodyl  10 mg Rectal Daily  . cefUROXime (ZINACEF)  IV  1.5 g Intravenous Q12H  . docusate sodium  200 mg Oral Daily  . famotidine (PEPCID) IV  20 mg Intravenous Q12H  . insulin regular  0-10 Units Intravenous TID WC  . metoCLOPramide (REGLAN) injection  10 mg Intravenous 4 times per day  . metoprolol tartrate  12.5 mg Oral BID   Or  . metoprolol tartrate  12.5 mg Per Tube BID  . [START ON 03/20/2015] pantoprazole  40 mg Oral Daily  . sodium chloride  3 mL Intravenous Q12H  . tamsulosin  0.4 mg Oral Daily    Assessment: 74 YO male presented with a STEMI and is s/p CABG x 3 this AM. Amiodarone initiated and pharmacy consulted to check for drug drug interactions.  Possible DDIs: QTc prolongation with amiodarone and famotidine and reglan use Risk of statin toxicity with concurrent amiodarone + atorvastatin use  Risk of low BP with amiodarone plus metoprolol and tamsulosin  Plan:  Monitor EKG for QTc prolongation Change atorvastatin to simvastatin 20 mg daily to decrease risk of adverse effects Monitor blood pressure for hypotension and adjust medications if needed  Joya San, PharmD Clinical Pharmacy Resident Pager # [phone] 03/19/2015 9:50  AM    Addendum: Changed statin back to atorvastatin as per prior dosing. Monitor for adverse effects.  [place], Pharm.D., BCPS Clinical Pharmacist Pager: [phone] 03/19/2015 11:23 AM

## 2015-03-19 NOTE — Progress Notes (Signed)
Initial Nutrition Assessment  DOCUMENTATION CODES:  Not applicable  INTERVENTION:  If unable to extubate and medically able, recommend initiation of TF: Vital AF 1.2 @ 20 ml/hr via NG/OGT and increase by 10 ml every 4 hours to goal rate of 60 ml/hr.   Tube feeding regimen provides 1728 kcal (96% of needs), 108 grams of protein, and 1168 ml of H2O.   NUTRITION DIAGNOSIS:  Inadequate oral intake related to inability to eat as evidenced by NPO status.  GOAL:  Patient will meet greater than or equal to 90% of their needs  MONITOR:  Diet advancement, Vent status, Labs, I & O's, Weight trends  REASON FOR ASSESSMENT:  Ventilator    ASSESSMENT:  74 y/o male PMHx DM, HTN, GERD, HLD, presents w/ 24 hours of indigestion which is a chronic issue for him. This indigestion brought pt to ed. ECG show inferoposterior infarct. Troponin elevated. Now s/p emergent CABG x3. Remains intubated post op.  Was able to communicate w/ pt while on vent. He says he has had no recent changes in his weight. He says he does have chronic indigestion, but this does not affect how much he eats. He has had some nausea. Denies v/c/d. He reports that he did not follow any type of therapeutic diet at home.   Per EMR he does appear to have lost about 10 lbs these last couple months. His ED weight come have potentially been a mis-weight.    Abdomen: distended  NFPE: WDL  Patient is currently intubated on ventilator support MV: 10.3 L/min Temp (24hrs), Avg:98.4 F (36.9 C), Min:96.4 F (35.8 C), Max:99.7 F (37.6 C)  Propofol: none  Labs reviewed: Elevated mag, and BUN/Creat  Diet Order:  Diet NPO time specified  Skin:  Surgical incisions chest, abdomen, leg  Last BM:  PTA  Height:  Ht Readings from Last 1 Encounters:  03/18/15 5\' 8"  (1.727 m)    Weight:  Wt Readings from Last 1 Encounters:  03/19/15 189 lb 2.5 oz (85.8 kg)   Wt Readings from Last 10 Encounters:  03/19/15 189 lb 2.5 oz (85.8 kg)   01/18/15 172 lb (78.019 kg)  01/13/15 172 lb (78.019 kg)  12/08/14 174 lb (78.926 kg)  11/22/14 175 lb (79.379 kg)  05/21/14 177 lb (80.287 kg)  11/18/13 180 lb (81.647 kg)  09/02/13 179 lb (81.194 kg)  08/19/13 179 lb 6.4 oz (81.375 kg)  05/21/13 181 lb (82.101 kg)   Admit weight: 163 lbs (74.1 kg)  Ideal Body Weight:  70 kg  BMI:  Body mass index is 28.77 kg/(m^2).  Estimated Nutritional Needs:  Kcal:  1793 kcal Protein:  104-119 (1.4-1.6 g/kg bw) Fluid:  Per MD  EDUCATION NEEDS:  No education needs identified at this time  Burtis Junes RD, LDN Nutrition Pager: [phone] 03/19/2015 1:21 PM

## 2015-03-19 NOTE — Op Note (Signed)
Miguel Vazquez, LANSDALE                 ACCOUNT NO.:  [id number]  MEDICAL RECORD NO.:  [id number]  LOCATION:  2S10C                        FACILITY:  [facility]  PHYSICIAN:  Revonda Standard. 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  PROCEDURE:   Emergency median sternotomy, extracorporeal circulation, Coronary artery bypass grafting x 3  Left internal mammary artery to left anterior descending  Saphenous vein graft to obtuse marginal 2  Saphenous vein graft to posterior lateral Endoscopic vein harvest right thigh.  SURGEON:  Revonda Standard. Roxan Hockey, M.D.  ASSISTANT:  Ellwood Handler, PA-C.  ANESTHESIA:  General.  FINDINGS:  Transesophageal echocardiography prebypass showed severe left ventricular dysfunction with ejection fraction estimated at 20%.  There was moderate (2+) mitral regurgitation.  Postbypass transesophageal echocardiography revealed improved left ventricular wall motion (on inotropic support) and no change in the mitral regurgitation.  Left internal mammary artery good quality. Vein fair quality Targets good quality.  CLINICAL NOTE:  Miguel Vazquez is a 74 year old man, who presented to the [facility] emergency room earlier today with a 24-hour history of severe indigestion.  His wife noted confusion and he was brought to the emergency room.  On arrival, he was noted to have ST-elevation in the inferior leads with compensatory ST depression in the anterior leads.  His troponin was elevated at 22.  He was transported emergently to the [facility] Cath Lab.  At catheterization, he was found to have severe left main and three-vessel disease with heavily  calcified vessels, not amenable to percutaneous intervention.  He was initially having pain on arrival to the catheterization lab, but when I saw him in consultation, he was denying pain and his ST elevation had normalized. The patient was advised to undergo emergent coronary artery bypass grafting for survival benefit and myocardial preservation.  The indications, risks, benefits, and alternatives were discussed in detail with the patient.  He accepted the risks and agreed to proceed.  The patient's family was informed of the emergent high-risk nature of the procedure as well.  OPERATIVE NOTE:  Miguel Vazquez was brought emergently to the operating room on March 18, 2015.  He had placement of a Swan-Ganz catheter and arterial blood pressure monitoring line by the Anesthesia Service.  He was noted to be confused prior to induction.  He had induction of general anesthesia and was intubated.  A Foley catheter was placed. Intravenous antibiotics were administered.  Transesophageal echocardiography was performed and showed severe left ventricular dysfunction and moderate mitral regurgitation.  The chest, abdomen, and legs were prepped and draped in usual sterile fashion.    After performing a time-out, a median sternotomy was performed and the left internal mammary artery was harvested using standard technique.  Simultaneously, an incision was made in the right leg at the level of the knee and the greater saphenous vein was harvested from just below the knee to the groin.  The mammary artery  was a good quality vessel.  The saphenous vein was only fair quality.  2000 units of heparin was administered during the vessel harvest.  The remainder of the full heparin dose was given prior to opening the pericardium.  After harvesting the conduits, the pericardium was opened.  There was marked cardiomegaly.  The aorta was relatively small.  There was no palpable atherosclerotic disease.  The aorta was  cannulated via concentric 2-0 Ethibond pledgeted pursestring sutures.  A dual-stage venous cannula was placed via a pursestring suture in the right atrial appendage.  Cardiopulmonary bypass was initiated.  Flows were maintained per protocol.  The patient was cooled to 32 degrees Celsius.  The coronary arteries were inspected and anastomotic sites were chosen.  The conduits were inspected and cut to length.  A foam pad was placed in the pericardium to insulate the heart.  A temperature probe was placed in myocardial septum and a cardioplegia cannula was placed in the ascending aorta.  The left ventricular vent was placed via a pursestring suture in the right superior pulmonary vein.  A retrograde cardioplegia cannula was placed via a pursestring suture in the right atrium and directed into the coronary sinus.  The aorta was crossclamped.  The left ventricle was emptied via the vents.  Cardiac arrest then was achieved with a combination of cold antegrade and retrograde blood cardioplegia and topical iced saline. Initial 750 mL of cardioplegia was administered antegrade.  There was rapid septal cooling, but the patient was slow to have a diastolic arrest.  An additional 500 mL of cardioplegia was administered via the retrograde cannula and there was continued septal cooling to 7 degrees Celsius.  The following distal anastomoses were performed.  A reversed saphenous vein graft was placed end-to-side to the posterior lateral branch of the right coronary.  This was the only graftable branch of the right coronary.  The posterior descending was too small and diffusely diseased to graft.  The posterolateral branch was a 1.5 mm vessel.  The vein was fair quality.  The target was good quality.  An end-to-side anastomosis was performed with a running 7-0 Prolene suture. At the completion of anastomosis, a probe passed easily distally. Cardioplegia was administered.  There was good flow and good  hemostasis. Additional cardioplegia was administered down the graft as well as the aortic root.  The heart was elevated exposing the lateral wall.  The obtuse marginal 2 was the dominant lateral wall vessel.  A reversed saphenous vein graft was anastomosed end-to-side with a running 7-0 Prolene suture.  OM2 was a 1.5 mm target.  The vein was of fair quality.  A probe passed easily distally at the completion of anastomosis, although there was some plaque distal to the anastomosis, the probe did pass this area.  Again cardioplegia was administered and there was good flow and good hemostasis.  Next, the left internal mammary artery was brought through an incision in the pericardium.  The distal end was beveled.  It was then anastomosed end-to-side to the distal LAD.  The mammary was a 1.5 mm good quality conduit.  The LAD was a 1.5 mm good quality target.  The anastomosis was performed with a running 8-0 Prolene suture.  At the completion of the mammary to LAD anastomosis, the bulldog clamp was briefly removed to inspect for hemostasis.  Rapid septal rewarming was noted.  The bulldog clamp was replaced.  The mammary pedicle was tacked to the epicardial surface of the heart with 6-0 Prolene  sutures.  Additional cardioplegia was administered.  The vein grafts were cut to length.  The cardioplegia cannula was removed from the ascending aorta. Proximal vein graft anastomoses were performed to 4.0 mm punch aortotomies with running 6-0 Prolene sutures.  At the completion of the final proximal anastomosis, the patient was placed in Trendelenburg position.  A warm dose of retrograde cardioplegia was administered.  The left ventricle and aortic root were de-aired.  The bulldog clamp was removed from the left mammary artery and the aortic crossclamp was removed.  The total crossclamp time was 70 minutes.  The patient required 2 defibrillations with 10 joules and then was in heart block.  While  rewarming was completed, all proximal and distal anastomoses were inspected for hemostasis.  There was a small leak at the heel of the mammary to LAD anastomosis, which was repaired with an 8-0 Prolene suture.  Epicardial pacing wires were placed on the right ventricle and right atrium and the patient was DDD paced at 90 beats per minute.  A dopamine infusion had been initiated at the beginning of cardiopulmonary bypass at 3 mcg/kg per minute.  The patient was now loaded with milrinone and an infusion was begun at 0.375 mcg/kg per minute.  When the patient rewarmed to a core temperature of 37 degrees Celsius, a test wean from bypass was performed.  The left ventricular vent was removed prior to the test wean.  The left ventricle was still relatively sluggish and the patient was placed back on full support.  It was noted that the mitral regurgitation was no worse.  The retrograde cardioplegia cannula was removed.  The dopamine infusion was discontinued. The patient was started on epinephrine and norepinephrine infusions and vasopressin in addition to the Milrinone, this was because the patient required high doses of Neo-Synephrine even prior to the Milrinone being initiated.  After 15 minutes, the patient then was weaned from cardiopulmonary bypass on the first attempt.  The initial cardiac index was greater than 2 L/min/m2.  Postbypass transesophageal echocardiography revealed some improvement in left ventricular wall motion, albeit in the setting of inotropic support.  The ejection fraction appeared to be approximately 35%.  There was some septal dyskinesis initially with pacing; however, the patient began overriding the pacemaker and that resolved.  The patient then was in sinus rhythm.  A test dose of protamine was administered and was well tolerated. The atrial and aortic cannulae were removed.  The remainder of protamine was administered without incident.  The chest was irrigated with  warm saline.  Hemostasis was achieved.  A left pleural and mediastinal chest tubes were placed via separate subcostal incisions.  The pericardium was not closed.  The sternum was closed with a combination of single and double heavy gauge stainless steel wires.  The pectoralis fascia, subcutaneous tissue, and skin were closed in standard fashion.  All sponge, needle, and instrument counts were correct at the end of the procedure.  The patient was taken and in critical, but stable condition from the operating room to the surgical intensive care unit.     Revonda Standard Roxan Hockey, M.D.     SCH/MEDQ  D:  03/18/2015  T:  03/19/2015  Job:  [id number]

## 2015-03-19 NOTE — Progress Notes (Signed)
75F Sheath removed from Rt Radial Arterial site. TR band placed with 51ml of air. Site soft with no active bleeding or hematoma noted. Post Sheath removal instructions reviewed with Janice,RN. Patient sedated/intubated at this time and resting comfortably.

## 2015-03-19 NOTE — Anesthesia Postprocedure Evaluation (Signed)
Anesthesia Post Note  Patient: Miguel Vazquez  Procedure(s) Performed: Procedure(s) (LRB): CORONARY ARTERY BYPASS GRAFTING (CABG) x  three, using left internal mammary artery and right leg greater saphenous vein harvested endoscopically (N/A) INTRAOPERATIVE TRANSESOPHAGEAL ECHOCARDIOGRAM (N/A)  Patient location during evaluation: SICU Anesthesia Type: General Level of consciousness: sedated and patient remains intubated per anesthesia plan Respiratory status: patient remains intubated per anesthesia plan Cardiovascular status: tachycardic Anesthetic complications: no    Last Vitals:  Filed Vitals:   03/19/15 0630 03/19/15 0645  BP:    Pulse:    Temp: 37.4 C 37.4 C  Resp: 18 20    Last Pain:  Filed Vitals:   03/19/15 [id number]  PainSc: [place]

## 2015-03-19 NOTE — Progress Notes (Signed)
1 Day Post-Op Procedure(s) (LRB): CORONARY ARTERY BYPASS GRAFTING (CABG) x  three, using left internal mammary artery and right leg greater saphenous vein harvested endoscopically (N/A) INTRAOPERATIVE TRANSESOPHAGEAL ECHOCARDIOGRAM (N/A) Subjective: Intubated, sedated Has woken up and followed commands  Objective: Vital signs in last 24 hours: Temp:  [96.4 F (35.8 C)-99.7 F (37.6 C)] 99.3 F (37.4 C) (12/17 1000) Pulse Rate:  [0-140] 110 (12/17 1000) Cardiac Rhythm:  [-] Sinus tachycardia (12/17 0800) Resp:  [0-49] 23 (12/17 1000) BP: (67-107)/(46-74) 88/54 mmHg (12/17 1000) SpO2:  [0 %-100 %] 100 % (12/17 1000) Arterial Line BP: (64-239)/(40-65) 84/54 mmHg (12/17 1000) FiO2 (%):  [50 %] 50 % (12/17 0756) Weight:  [163 lb (73.936 kg)-189 lb 2.5 oz (85.8 kg)] 189 lb 2.5 oz (85.8 kg) (12/17 0500)  Hemodynamic parameters for last 24 hours: PAP: (32-57)/(20-36) 44/27 mmHg CO:  [3.1 L/min-5.2 L/min] 3.7 L/min CI:  [1.7 L/min/m2-2.8 L/min/m2] 2 L/min/m2  Intake/Output from previous day: 12/16 0701 - 12/17 0700 In: 6358.2 [I.V.:4173.2; Blood:585; IV Piggyback:1600] Out: 990 [Urine:820; Chest Tube:170] Intake/Output this shift: Total I/O In: 627.5 [I.V.:627.5] Out: 95 [Urine:65; Chest Tube:30]  General appearance: intubated and sedated Neurologic: no focal deficit when following commands Heart: regular rate and rhythm Lungs: clear anteriorly Abdomen: mildly distended, hypoactive BS  Lab Results:  Recent Labs  03/18/15 2209 03/19/15 0234 03/19/15 0415  WBC 13.0*  --  11.4*  HGB 8.0* 7.8* 8.3*  HCT 23.4* 23.0* 24.1*  PLT 77*  --  93*   BMET:  Recent Labs  03/18/15 1423  03/19/15 0234 03/19/15 0415  NA 133*  < > 137 135  K 4.7  < > 4.1 4.3  CL 93*  < > 103 105  CO2 26  --   --  22  GLUCOSE 221*  < > 115* 139*  BUN 61*  < > 38* 41*  CREATININE 2.18*  < > 1.40* 1.52*  CALCIUM 9.5  --   --  8.1*  < > = values in this interval not displayed.  PT/INR:  Recent  Labs  03/18/15 2209  LABPROT 19.3*  INR 1.63*   ABG    Component Value Date/Time   PHART 7.342* 03/19/2015 0400   HCO3 21.0 03/19/2015 0400   TCO2 22 03/19/2015 0400   ACIDBASEDEF 4.0* 03/19/2015 0400   O2SAT 98.0 03/19/2015 0400   CBG (last 3)   Recent Labs  03/19/15 0358 03/19/15 0459 03/19/15 0557  GLUCAP 134* 128* 121*    Assessment/Plan: S/P Procedure(s) (LRB): CORONARY ARTERY BYPASS GRAFTING (CABG) x  three, using left internal mammary artery and right leg greater saphenous vein harvested endoscopically (N/A) INTRAOPERATIVE TRANSESOPHAGEAL ECHOCARDIOGRAM (N/A) POD # 1   CV- s/p emergency CABG for severe left main and 3 VD with STEMI  He had a significant MI with troponin of 65 and MB > 260  On milrinone, epi, norepi and vasopressin overnight  Will dc vasopressin, continue milrinone, wean other drips as BP/ CI allow   RESP- ABG Ok. Need to get some fluid off before trying to extubate  RENAL- creatinine below admission level, but UO relatively low  Will give albumin and start lasix drip to try to stimulate diuresis  ENDO- CBG well controlled, continue insulin drip  Thrombocytopenia a little better this AM, no evidence of bleeding, will follow  Will use SCD for DVT prophylaxis for now given thrombocytopenia   LOS: 1 day    Melrose Nakayama 03/19/2015

## 2015-03-20 ENCOUNTER — Inpatient Hospital Stay (HOSPITAL_COMMUNITY): Payer: Medicare HMO

## 2015-03-20 ENCOUNTER — Other Ambulatory Visit: Payer: Self-pay | Admitting: Family Medicine

## 2015-03-20 LAB — CBC
HEMATOCRIT: 26.5 % — AB (ref 39.0–52.0)
Hemoglobin: 8.9 g/dL — ABNORMAL LOW (ref 13.0–17.0)
MCH: 31.4 pg (ref 26.0–34.0)
MCHC: 33.6 g/dL (ref 30.0–36.0)
MCV: 93.6 fL (ref 78.0–100.0)
Platelets: 119 10*3/uL — ABNORMAL LOW (ref 150–400)
RBC: 2.83 MIL/uL — ABNORMAL LOW (ref 4.22–5.81)
RDW: 14.6 % (ref 11.5–15.5)
WBC: 12.1 10*3/uL — ABNORMAL HIGH (ref 4.0–10.5)

## 2015-03-20 LAB — COMPREHENSIVE METABOLIC PANEL
ALBUMIN: 2.8 g/dL — AB (ref 3.5–5.0)
ALT: 30 U/L (ref 17–63)
ANION GAP: 9 (ref 5–15)
AST: 100 U/L — ABNORMAL HIGH (ref 15–41)
Alkaline Phosphatase: 44 U/L (ref 38–126)
BILIRUBIN TOTAL: 1 mg/dL (ref 0.3–1.2)
BUN: 38 mg/dL — ABNORMAL HIGH (ref 6–20)
CALCIUM: 8.5 mg/dL — AB (ref 8.9–10.3)
CO2: 20 mmol/L — ABNORMAL LOW (ref 22–32)
Chloride: 101 mmol/L (ref 101–111)
Creatinine, Ser: 1.71 mg/dL — ABNORMAL HIGH (ref 0.61–1.24)
GFR calc non Af Amer: 38 mL/min — ABNORMAL LOW (ref >60)
GFR, EST AFRICAN AMERICAN: 44 mL/min — AB (ref >60)
GLUCOSE: 148 mg/dL — AB (ref 65–99)
POTASSIUM: 3.7 mmol/L (ref 3.5–5.1)
SODIUM: 130 mmol/L — AB (ref 135–145)
TOTAL PROTEIN: 4.9 g/dL — AB (ref 6.5–8.1)

## 2015-03-20 LAB — BASIC METABOLIC PANEL
Anion gap: 8 (ref 5–15)
BUN: 38 mg/dL — AB (ref 6–20)
CALCIUM: 8.3 mg/dL — AB (ref 8.9–10.3)
CO2: 20 mmol/L — ABNORMAL LOW (ref 22–32)
CREATININE: 1.8 mg/dL — AB (ref 0.61–1.24)
Chloride: 103 mmol/L (ref 101–111)
GFR calc Af Amer: 41 mL/min — ABNORMAL LOW (ref >60)
GFR, EST NON AFRICAN AMERICAN: 36 mL/min — AB (ref >60)
Glucose, Bld: 102 mg/dL — ABNORMAL HIGH (ref 65–99)
POTASSIUM: 4.3 mmol/L (ref 3.5–5.1)
SODIUM: 131 mmol/L — AB (ref 135–145)

## 2015-03-20 LAB — POCT I-STAT 3, ART BLOOD GAS (G3+)
ACID-BASE DEFICIT: 4 mmol/L — AB (ref 0.0–2.0)
ACID-BASE DEFICIT: 7 mmol/L — AB (ref 0.0–2.0)
Bicarbonate: 20.8 mEq/L (ref 20.0–24.0)
Bicarbonate: 17 mEq/L — ABNORMAL LOW (ref 20.0–24.0)
O2 Saturation: 99 %
O2 Saturation: 93 %
PH ART: 7.389 (ref 7.350–7.450)
PH ART: 7.363 (ref 7.350–7.450)
TCO2: 22 mmol/L (ref 0–100)
TCO2: 18 mmol/L (ref 0–100)
pCO2 arterial: 34.5 mmHg — ABNORMAL LOW (ref 35.0–45.0)
pCO2 arterial: 30 mmHg — ABNORMAL LOW (ref 35.0–45.0)
pO2, Arterial: 143 mmHg — ABNORMAL HIGH (ref 80.0–100.0)
pO2, Arterial: 71 mmHg — ABNORMAL LOW (ref 80.0–100.0)

## 2015-03-20 LAB — GLUCOSE, CAPILLARY
GLUCOSE-CAPILLARY: 83 mg/dL (ref 65–99)
GLUCOSE-CAPILLARY: 127 mg/dL — AB (ref 65–99)
GLUCOSE-CAPILLARY: 155 mg/dL — AB (ref 65–99)
GLUCOSE-CAPILLARY: 93 mg/dL (ref 65–99)
GLUCOSE-CAPILLARY: 103 mg/dL — AB (ref 65–99)
GLUCOSE-CAPILLARY: 155 mg/dL — AB (ref 65–99)
GLUCOSE-CAPILLARY: 120 mg/dL — AB (ref 65–99)
GLUCOSE-CAPILLARY: 160 mg/dL — AB (ref 65–99)
GLUCOSE-CAPILLARY: 141 mg/dL — AB (ref 65–99)
GLUCOSE-CAPILLARY: 105 mg/dL — AB (ref 65–99)
GLUCOSE-CAPILLARY: 92 mg/dL (ref 65–99)
GLUCOSE-CAPILLARY: 110 mg/dL — AB (ref 65–99)
GLUCOSE-CAPILLARY: 102 mg/dL — AB (ref 65–99)
GLUCOSE-CAPILLARY: 147 mg/dL — AB (ref 65–99)
Glucose-Capillary: 104 mg/dL — ABNORMAL HIGH (ref 65–99)
Glucose-Capillary: 109 mg/dL — ABNORMAL HIGH (ref 65–99)
Glucose-Capillary: 109 mg/dL — ABNORMAL HIGH (ref 65–99)
Glucose-Capillary: 111 mg/dL — ABNORMAL HIGH (ref 65–99)
Glucose-Capillary: 112 mg/dL — ABNORMAL HIGH (ref 65–99)
Glucose-Capillary: 121 mg/dL — ABNORMAL HIGH (ref 65–99)
Glucose-Capillary: 122 mg/dL — ABNORMAL HIGH (ref 65–99)
Glucose-Capillary: 130 mg/dL — ABNORMAL HIGH (ref 65–99)
Glucose-Capillary: 130 mg/dL — ABNORMAL HIGH (ref 65–99)
Glucose-Capillary: 139 mg/dL — ABNORMAL HIGH (ref 65–99)
Glucose-Capillary: 155 mg/dL — ABNORMAL HIGH (ref 65–99)
Glucose-Capillary: 81 mg/dL (ref 65–99)
Glucose-Capillary: 93 mg/dL (ref 65–99)
Glucose-Capillary: 97 mg/dL (ref 65–99)
Glucose-Capillary: 98 mg/dL (ref 65–99)

## 2015-03-20 IMAGING — CR DG CHEST 1V PORT
1 series · 1 of 1 positions shown · non-contrast
Comparison: [DATE] and prior exams

CLINICAL DATA: 74-year-old male status post CABG.

EXAM:
PORTABLE CHEST 1 VIEW

[AP]
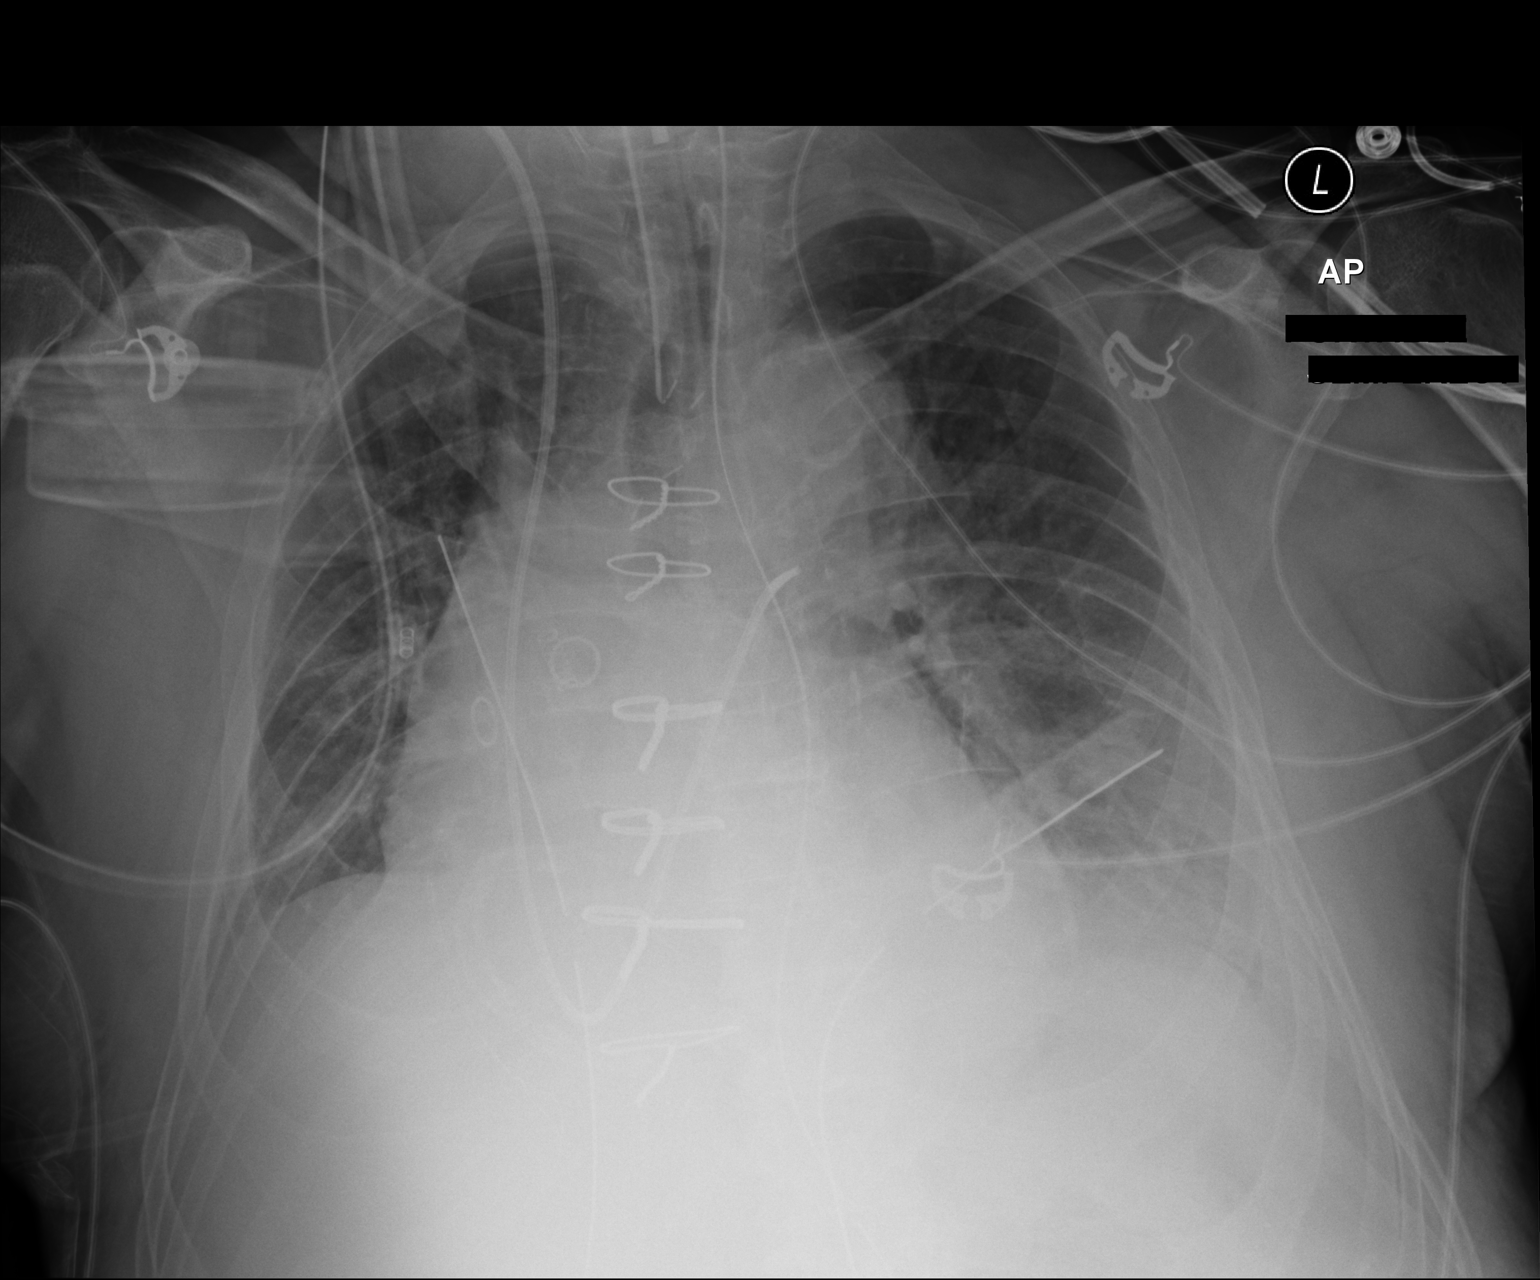

[1 of 1 positions shown; findings below may reference images not displayed]

FINDINGS: Cardiomegaly, CABG changes, endotracheal tube with tip 4.2 cm above
the carina, Swan-Ganz catheter with tip overlying the main pulmonary
artery, thoracostomy tubes, and NG tube entering the stomach with
tip off the field of view again noted.

Pulmonary vascular congestion and bibasilar atelectasis again noted.

There is no evidence of pneumothorax.
IMPRESSION: Unchanged appearance of the chest with pulmonary vascular
congestion, bibasilar atelectasis and support apparatus as
described. No evidence of pneumothorax.

## 2015-03-20 MED ORDER — MILRINONE IN DEXTROSE 20 MG/100ML IV SOLN
0.1250 ug/kg/min | INTRAVENOUS | Status: DC
  Administered 2015-03-21: 0.125 ug/kg/min via INTRAVENOUS
  Filled 2015-03-20: qty 100

## 2015-03-20 MED ORDER — SODIUM BICARBONATE 8.4 % IV SOLN
50.0000 meq | Freq: Once | INTRAVENOUS | Status: AC
  Administered 2015-03-20: 50 meq via INTRAVENOUS

## 2015-03-20 MED ORDER — POTASSIUM CHLORIDE 10 MEQ/50ML IV SOLN
10.0000 meq | INTRAVENOUS | Status: AC | PRN
  Administered 2015-03-20 (×3): 10 meq via INTRAVENOUS
  Filled 2015-03-20 (×3): qty 50

## 2015-03-20 NOTE — Progress Notes (Signed)
      [address]       [place] [address]             [phone]      Extubated visiting with family, no complaints  BP 92/65 mmHg  Pulse 100  Temp(Src) 100.8 F (38.2 C) (Core (Comment))  Resp 28  Ht 5\' 8"  (1.727 m)  Wt 195 lb 15.8 oz (88.9 kg)  BMI 29.81 kg/m2  SpO2 99%  54/25 CI= 2.4   Intake/Output Summary (Last 24 hours) at 03/20/15 1735 Last data filed at 03/20/15 1700  Gross per 24 hour  Intake 3448.93 ml  Output   2880 ml  Net 568.93 ml    On milrinone, epi, norepi- down to 2 mcg/min  Some bigeminy- will give IV lopressor  Remo Lipps C. Roxan Hockey, MD [facility] [phone]

## 2015-03-20 NOTE — Progress Notes (Signed)
2 Days Post-Op Procedure(s) (LRB): CORONARY ARTERY BYPASS GRAFTING (CABG) x  three, using left internal mammary artery and right leg greater saphenous vein harvested endoscopically (N/A) INTRAOPERATIVE TRANSESOPHAGEAL ECHOCARDIOGRAM (N/A) Subjective: Intubated, sedated, responds appropriately   Objective: Vital signs in last 24 hours: Temp:  [98.6 F (37 C)-100 F (37.8 C)] 98.8 F (37.1 C) (12/18 0700) Pulse Rate:  [35-110] 102 (12/18 0751) Cardiac Rhythm:  [-] Sinus tachycardia (12/18 0400) Resp:  [11-32] 24 (12/18 0751) BP: (67-103)/(44-74) 67/44 mmHg (12/18 0751) SpO2:  [100 %] 100 % (12/18 0751) Arterial Line BP: (74-135)/(39-67) 100/55 mmHg (12/18 0700) FiO2 (%):  [50 %] 50 % (12/18 0751) Weight:  [195 lb 15.8 oz (88.9 kg)] 195 lb 15.8 oz (88.9 kg) (12/18 0450)  Hemodynamic parameters for last 24 hours: PAP: (32-53)/(18-29) 39/22 mmHg CO:  [3.4 L/min-4 L/min] 3.8 L/min CI:  [1.8 L/min/m2-2.1 L/min/m2] 2 L/min/m2  Intake/Output from previous day: 12/17 0701 - 12/18 0700 In: 4636.5 [I.V.:4216.5; NG/GT:120; IV Piggyback:300] Out: 2385 [Urine:2175; Chest Tube:210] Intake/Output this shift:    General appearance: alert, cooperative and no distress Neurologic: no focal deficit Heart: regular rate and rhythm Lungs: clear anteriorly Abdomen: normal findings: soft, non-tender  Lab Results:  Recent Labs  03/19/15 1652 03/20/15 0224  WBC 10.1 12.1*  HGB 8.4* 8.9*  HCT 25.1* 26.5*  PLT 103* 119*   BMET:  Recent Labs  03/19/15 0415 03/19/15 1635 03/19/15 1652 03/20/15 0224  NA 135 133*  --  130*  K 4.3 4.1  --  3.7  CL 105 101  --  101  CO2 22  --   --  20*  GLUCOSE 139* 98  --  148*  BUN 41* 36*  --  38*  CREATININE 1.52* 1.50* 1.70* 1.71*  CALCIUM 8.1*  --   --  8.5*    PT/INR:  Recent Labs  03/18/15 2209  LABPROT 19.3*  INR 1.63*   ABG    Component Value Date/Time   PHART 7.342* 03/19/2015 0400   HCO3 21.0 03/19/2015 0400   TCO2 21 03/19/2015  1635   ACIDBASEDEF 4.0* 03/19/2015 0400   O2SAT 98.0 03/19/2015 0400   CBG (last 3)   Recent Labs  03/20/15 0602 03/20/15 0706 03/20/15 0750  GLUCAP 120* 160* 141*    Assessment/Plan: S/P Procedure(s) (LRB): CORONARY ARTERY BYPASS GRAFTING (CABG) x  three, using left internal mammary artery and right leg greater saphenous vein harvested endoscopically (N/A) INTRAOPERATIVE TRANSESOPHAGEAL ECHOCARDIOGRAM (N/A) POD # 2  CV- maintaining index ~ 2 on current regimen  Will try decreasing milrinone to 0.125  Then wean norepi and epi as tolerated  RESP- CXR still shows some vascular congestion  Gas exchange OK, will try to wean vent today  RENAL- some diuresis- 125 ml/hr with lasix drip  Creatinine 1.7, K= 3.7- supplemented  ENDO- still requiring insulin drip. Will transition to levimir + SSI post extubation  SCD + enoxaparin for DVT prophylaxis  Protonix for stress ulcer prophylaxis   LOS: 2 days    Melrose Nakayama 03/20/2015

## 2015-03-20 NOTE — Procedures (Signed)
Extubation Procedure Note  Patient Details:   Name: [name] DOB: 08-Jan-1941 MRN: [id number]   Airway Documentation:     Evaluation  O2 sats: stable throughout Complications: No apparent complications Patient did tolerate procedure well. Bilateral Breath Sounds: Clear, Diminished Suctioning: Airway Yes   Patient extubated to 2L nasal cannula per protocol.  Positive cuff leak noted.  No evidence of stridor.  Sats currently 99%.  Vitals are stable.  Incentive spirometry performed x5 with achieved goal of 300.  Patient able to speak post extubation.  No apparent complications.   [name] 03/20/2015, 11:32 AM

## 2015-03-20 NOTE — Progress Notes (Signed)
Go to check on patient, and pt had removed oxygen, removed central line dressing, and had pulled swan out to 32. Immediately reoriented pt and applied mittens for patient safety, explaining to pt that they will serve as a reminder not to pull on things. New dressing to central line. Dr. Roxan Hockey made aware about swan and ok'd to pull.

## 2015-03-21 ENCOUNTER — Ambulatory Visit: Payer: Medicare HMO | Admitting: Internal Medicine

## 2015-03-21 ENCOUNTER — Inpatient Hospital Stay (HOSPITAL_COMMUNITY): Payer: Medicare HMO

## 2015-03-21 ENCOUNTER — Encounter (HOSPITAL_COMMUNITY): Payer: Self-pay | Admitting: Cardiology

## 2015-03-21 DIAGNOSIS — K4090 Unilateral inguinal hernia, without obstruction or gangrene, not specified as recurrent: Secondary | ICD-10-CM

## 2015-03-21 LAB — CBC
HEMATOCRIT: 27 % — AB (ref 39.0–52.0)
HEMOGLOBIN: 9.5 g/dL — AB (ref 13.0–17.0)
MCH: 32.9 pg (ref 26.0–34.0)
MCHC: 35.2 g/dL (ref 30.0–36.0)
MCV: 93.4 fL (ref 78.0–100.0)
Platelets: 125 10*3/uL — ABNORMAL LOW (ref 150–400)
RBC: 2.89 MIL/uL — ABNORMAL LOW (ref 4.22–5.81)
RDW: 14.4 % (ref 11.5–15.5)
WBC: 9.1 10*3/uL (ref 4.0–10.5)

## 2015-03-21 LAB — POCT I-STAT 3, ART BLOOD GAS (G3+)
ACID-BASE DEFICIT: 2 mmol/L (ref 0.0–2.0)
BICARBONATE: 22.6 meq/L (ref 20.0–24.0)
O2 Saturation: 94 %
PH ART: 7.386 (ref 7.350–7.450)
TCO2: 24 mmol/L (ref 0–100)
pCO2 arterial: 37.3 mmHg (ref 35.0–45.0)
pO2, Arterial: 70 mmHg — ABNORMAL LOW (ref 80.0–100.0)

## 2015-03-21 LAB — GLUCOSE, CAPILLARY
GLUCOSE-CAPILLARY: 105 mg/dL — AB (ref 65–99)
GLUCOSE-CAPILLARY: 109 mg/dL — AB (ref 65–99)
GLUCOSE-CAPILLARY: 109 mg/dL — AB (ref 65–99)
GLUCOSE-CAPILLARY: 115 mg/dL — AB (ref 65–99)
GLUCOSE-CAPILLARY: 117 mg/dL — AB (ref 65–99)
GLUCOSE-CAPILLARY: 133 mg/dL — AB (ref 65–99)
GLUCOSE-CAPILLARY: 143 mg/dL — AB (ref 65–99)
GLUCOSE-CAPILLARY: 75 mg/dL (ref 65–99)
Glucose-Capillary: 96 mg/dL (ref 65–99)
Glucose-Capillary: 116 mg/dL — ABNORMAL HIGH (ref 65–99)
Glucose-Capillary: 124 mg/dL — ABNORMAL HIGH (ref 65–99)
Glucose-Capillary: 124 mg/dL — ABNORMAL HIGH (ref 65–99)
Glucose-Capillary: 127 mg/dL — ABNORMAL HIGH (ref 65–99)
Glucose-Capillary: 123 mg/dL — ABNORMAL HIGH (ref 65–99)
Glucose-Capillary: 133 mg/dL — ABNORMAL HIGH (ref 65–99)
Glucose-Capillary: 97 mg/dL (ref 65–99)
Glucose-Capillary: 96 mg/dL (ref 65–99)

## 2015-03-21 LAB — BASIC METABOLIC PANEL
ANION GAP: 11 (ref 5–15)
Anion gap: 10 (ref 5–15)
BUN: 40 mg/dL — AB (ref 6–20)
BUN: 43 mg/dL — AB (ref 6–20)
CALCIUM: 8.2 mg/dL — AB (ref 8.9–10.3)
CHLORIDE: 101 mmol/L (ref 101–111)
CO2: 20 mmol/L — AB (ref 22–32)
CO2: 24 mmol/L (ref 22–32)
CREATININE: 2.03 mg/dL — AB (ref 0.61–1.24)
Calcium: 8.4 mg/dL — ABNORMAL LOW (ref 8.9–10.3)
Chloride: 100 mmol/L — ABNORMAL LOW (ref 101–111)
Creatinine, Ser: 1.89 mg/dL — ABNORMAL HIGH (ref 0.61–1.24)
GFR calc Af Amer: 39 mL/min — ABNORMAL LOW (ref >60)
GFR calc Af Amer: 36 mL/min — ABNORMAL LOW (ref >60)
GFR calc non Af Amer: 31 mL/min — ABNORMAL LOW (ref >60)
GFR, EST NON AFRICAN AMERICAN: 34 mL/min — AB (ref >60)
GLUCOSE: 118 mg/dL — AB (ref 65–99)
GLUCOSE: 103 mg/dL — AB (ref 65–99)
POTASSIUM: 3.9 mmol/L (ref 3.5–5.1)
POTASSIUM: 4 mmol/L (ref 3.5–5.1)
SODIUM: 135 mmol/L (ref 135–145)
Sodium: 131 mmol/L — ABNORMAL LOW (ref 135–145)

## 2015-03-21 LAB — CARBOXYHEMOGLOBIN
Carboxyhemoglobin: 1.4 % (ref 0.5–1.5)
Methemoglobin: 0.9 % (ref 0.0–1.5)
O2 Saturation: 52.3 %
TOTAL HEMOGLOBIN: 8.2 g/dL — AB (ref 13.5–18.0)

## 2015-03-21 LAB — MAGNESIUM: MAGNESIUM: 1.8 mg/dL (ref 1.7–2.4)

## 2015-03-21 IMAGING — CR DG CHEST 1V PORT
1 series · 1 of 1 positions shown · non-contrast
Comparison: Portable chest x-ray [DATE].

CLINICAL DATA: Status post CABG, history of diabetes and previous
smoking.

EXAM:
PORTABLE CHEST 1 VIEW

[AP]
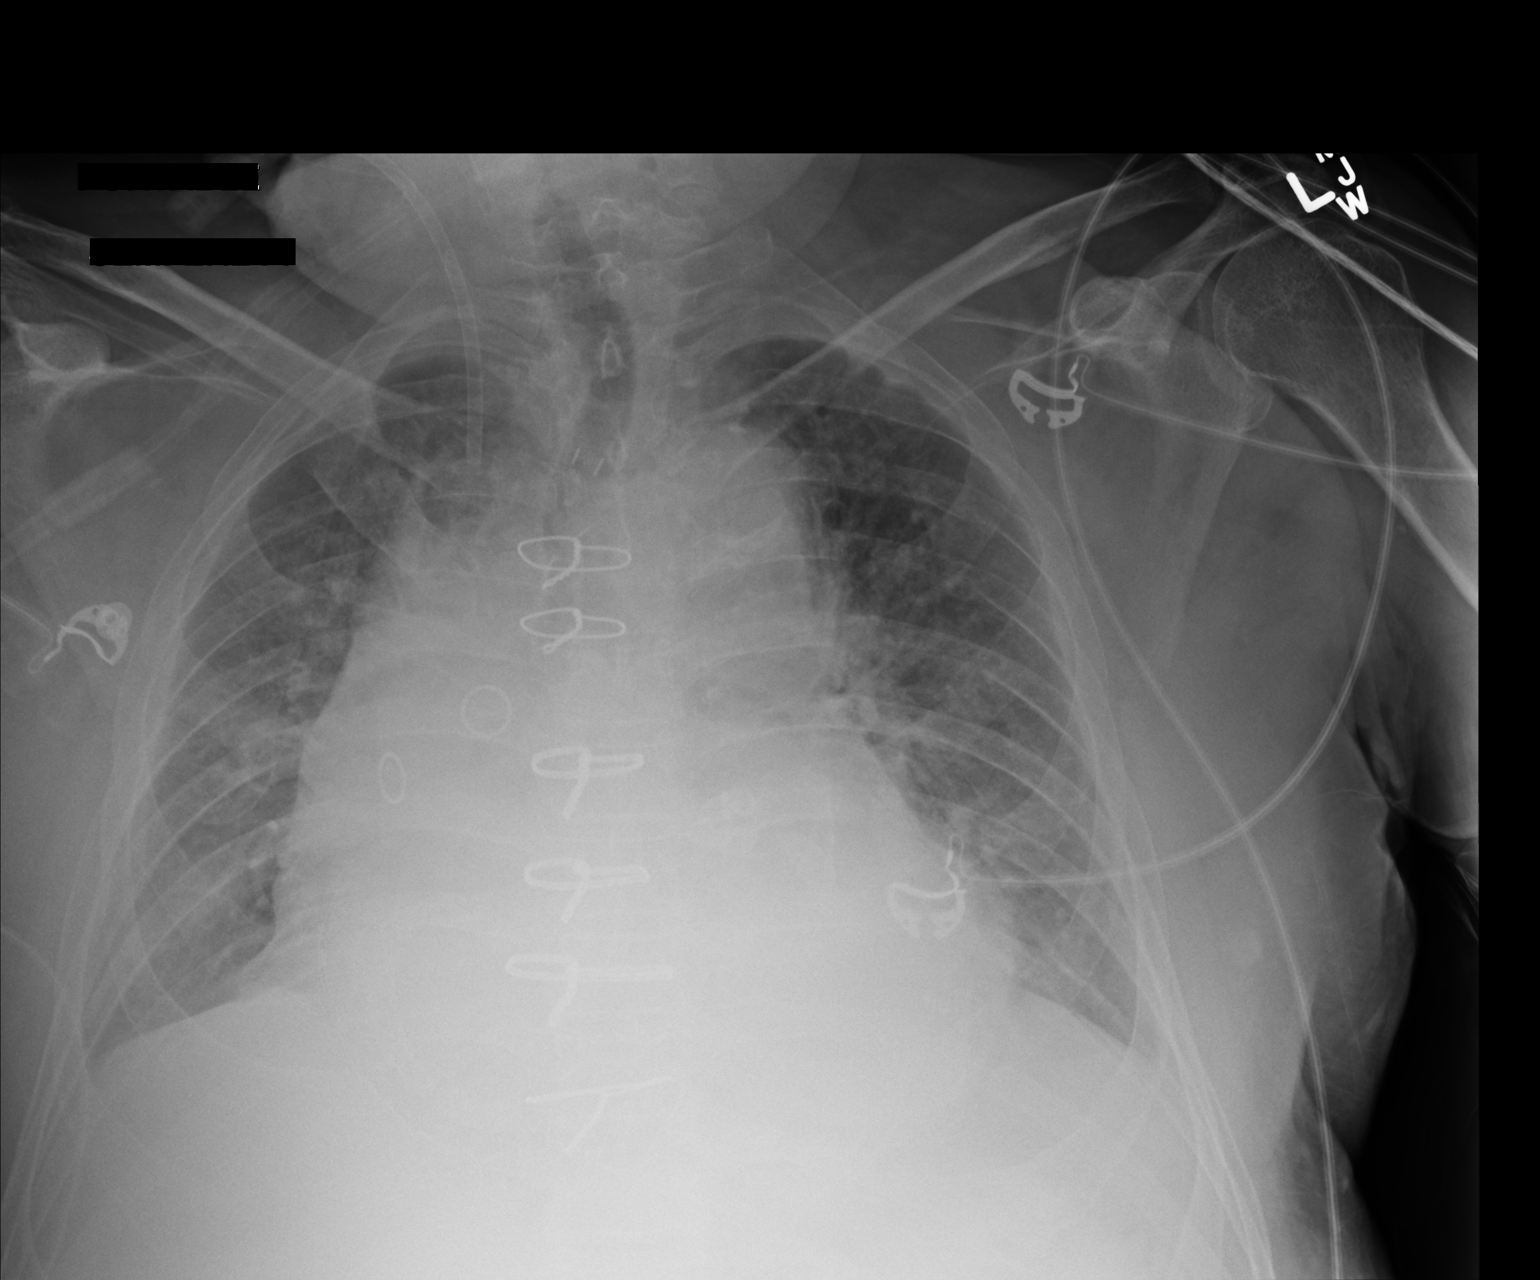

[1 of 1 positions shown; findings below may reference images not displayed]

FINDINGS: The trachea and esophagus have been extubated. The Swan-Ganz
catheter has been removed. There has been interval removal of the
bilateral chest tubes.

The lungs are adequately inflated. The interstitial markings are
mildly increased. There is no alveolar infiltrate. There is no
pneumothorax or significant pleural effusion. The cardiac silhouette
remains enlarged. There are 6 intact sternal wires. The right
internal jugular Cordis sheath tip projects at the junction of the
right internal jugular vein with the right subclavian vein.
IMPRESSION: Interval removal of most support tubes. Stable mild interstitial
edema with stable cardiomegaly interval improvement in left lower
lobe atelectasis. There is no pneumothorax or pleural effusion.

## 2015-03-21 MED ORDER — POTASSIUM CHLORIDE 10 MEQ/50ML IV SOLN: 10.0000 meq | INTRAVENOUS | Status: DC

## 2015-03-21 MED ORDER — POTASSIUM CHLORIDE CRYS ER 20 MEQ PO TBCR
20.0000 meq | EXTENDED_RELEASE_TABLET | Freq: Two times a day (BID) | ORAL | Status: AC
  Administered 2015-03-21 (×2): 20 meq via ORAL
  Filled 2015-03-21 (×2): qty 1

## 2015-03-21 MED ORDER — INSULIN ASPART 100 UNIT/ML ~~LOC~~ SOLN: 0.0000 [IU] | SUBCUTANEOUS | Status: DC

## 2015-03-21 MED ORDER — CETYLPYRIDINIUM CHLORIDE 0.05 % MT LIQD
7.0000 mL | Freq: Two times a day (BID) | OROMUCOSAL | Status: DC
  Administered 2015-03-21 – 2015-03-23 (×5): 7 mL via OROMUCOSAL

## 2015-03-21 MED ORDER — INSULIN ASPART 100 UNIT/ML ~~LOC~~ SOLN
0.0000 [IU] | SUBCUTANEOUS | Status: DC
  Administered 2015-03-22 (×3): 2 [IU] via SUBCUTANEOUS
  Administered 2015-03-23 (×2): 3 [IU] via SUBCUTANEOUS
  Administered 2015-03-23: 2 [IU] via SUBCUTANEOUS
  Administered 2015-03-23: 3 [IU] via SUBCUTANEOUS
  Administered 2015-03-24: 6 [IU] via SUBCUTANEOUS

## 2015-03-21 MED ORDER — SACCHAROMYCES BOULARDII 250 MG PO CAPS
250.0000 mg | ORAL_CAPSULE | Freq: Two times a day (BID) | ORAL | Status: DC
  Administered 2015-03-22 – 2015-03-26 (×9): 250 mg via ORAL
  Filled 2015-03-21 (×9): qty 1

## 2015-03-21 MED ORDER — INSULIN ASPART 100 UNIT/ML ~~LOC~~ SOLN: 0.0000 [IU] | Freq: Three times a day (TID) | SUBCUTANEOUS | Status: DC

## 2015-03-21 MED ORDER — INSULIN DETEMIR 100 UNIT/ML ~~LOC~~ SOLN
15.0000 [IU] | Freq: Every day | SUBCUTANEOUS | Status: DC
  Administered 2015-03-21 – 2015-03-24 (×4): 15 [IU] via SUBCUTANEOUS
  Filled 2015-03-21 (×4): qty 0.15

## 2015-03-21 MED FILL — Lidocaine HCl Local Preservative Free (PF) Inj 1%: INTRAMUSCULAR | Qty: 30 | Status: AC

## 2015-03-21 MED FILL — Magnesium Sulfate Inj 50%: INTRAMUSCULAR | Qty: 10 | Status: AC

## 2015-03-21 MED FILL — Electrolyte-R (PH 7.4) Solution: INTRAVENOUS | Qty: 3000 | Status: AC

## 2015-03-21 MED FILL — Lidocaine HCl IV Inj 20 MG/ML: INTRAVENOUS | Qty: 5 | Status: AC

## 2015-03-21 MED FILL — Heparin Sodium (Porcine) Inj 1000 Unit/ML: INTRAMUSCULAR | Qty: 10 | Status: AC

## 2015-03-21 MED FILL — Sodium Chloride IV Soln 0.9%: INTRAVENOUS | Qty: 2000 | Status: AC

## 2015-03-21 MED FILL — Sodium Bicarbonate IV Soln 8.4%: INTRAVENOUS | Qty: 50 | Status: AC

## 2015-03-21 MED FILL — Nitroglycerin IV Soln 100 MCG/ML in D5W: INTRA_ARTERIAL | Qty: 10 | Status: AC

## 2015-03-21 MED FILL — Mannitol IV Soln 20%: INTRAVENOUS | Qty: 500 | Status: AC

## 2015-03-21 MED FILL — Heparin Sodium (Porcine) Inj 1000 Unit/ML: INTRAMUSCULAR | Qty: 30 | Status: AC

## 2015-03-21 MED FILL — Potassium Chloride Inj 2 mEq/ML: INTRAVENOUS | Qty: 40 | Status: AC

## 2015-03-21 MED FILL — Heparin Sodium (Porcine) 2 Unit/ML in Sodium Chloride 0.9%: INTRAMUSCULAR | Qty: 500 | Status: AC

## 2015-03-21 NOTE — Progress Notes (Signed)
3 Days Post-Op Procedure(s) (LRB): CORONARY ARTERY BYPASS GRAFTING (CABG) x  three, using left internal mammary artery and right leg greater saphenous vein harvested endoscopically (N/A) INTRAOPERATIVE TRANSESOPHAGEAL ECHOCARDIOGRAM (N/A) Subjective:  No complaints  Reportedly restless overnight and pulled PA catheter.  Went into atrial fib overnight and started on amio. Then converted back to sinus.  Objective: Vital signs in last 24 hours: Temp:  [97.9 F (36.6 C)-101.3 F (38.5 C)] 98.7 F (37.1 C) (12/19 0809) Pulse Rate:  [34-125] 97 (12/19 0800) Cardiac Rhythm:  [-] Normal sinus rhythm (12/19 0800) Resp:  [20-39] 27 (12/19 0800) BP: (73-144)/(56-89) 104/72 mmHg (12/19 0800) SpO2:  [87 %-100 %] 100 % (12/19 0800) Arterial Line BP: (69-126)/(46-65) 112/58 mmHg (12/19 0800) FiO2 (%):  [40 %] 40 % (12/18 0951) Weight:  [88.1 kg (194 lb 3.6 oz)] 88.1 kg (194 lb 3.6 oz) (12/19 0500)  Hemodynamic parameters for last 24 hours: PAP: (17-67)/(9-40) 19/9 mmHg CO:  [3.1 L/min-4.7 L/min] 3.9 L/min CI:  [1.7 L/min/m2-2.5 L/min/m2] 2.1 L/min/m2  Intake/Output from previous day: 12/18 0701 - 12/19 0700 In: 2154.2 [I.V.:2154.2] Out: 3200 [Urine:3200] Intake/Output this shift: Total I/O In: -  Out: 130 [Urine:130]  General appearance: alert and cooperative Neurologic: intact Heart: regular rate and rhythm, S1, S2 normal, no murmur, click, rub or gallop Lungs: rales bilaterally Extremities: extremities normal, atraumatic, no cyanosis or edema Wound: dressing dry  Lab Results:  Recent Labs  03/20/15 0224 03/21/15 0402  WBC 12.1* 9.1  HGB 8.9* 9.5*  HCT 26.5* 27.0*  PLT 119* 125*   BMET:  Recent Labs  03/20/15 1702 03/21/15 0402  NA 131* 131*  K 4.3 3.9  CL 103 100*  CO2 20* 20*  GLUCOSE 102* 118*  BUN 38* 40*  CREATININE 1.80* 1.89*  CALCIUM 8.3* 8.2*    PT/INR:  Recent Labs  03/18/15 2209  LABPROT 19.3*  INR 1.63*   ABG    Component Value Date/Time    PHART 7.363 03/20/2015 1234   HCO3 17.0* 03/20/2015 1234   TCO2 18 03/20/2015 1234   ACIDBASEDEF 7.0* 03/20/2015 1234   O2SAT 52.3 03/21/2015 0335   CBG (last 3)   Recent Labs  03/20/15 2205 03/20/15 2303 03/21/15 0007  GLUCAP 96 109* 133*   CLINICAL DATA: Status post CABG, history of diabetes and previous smoking.  EXAM: PORTABLE CHEST 1 VIEW  COMPARISON: Portable chest x-ray of March 20, 2015.  FINDINGS: The trachea and esophagus have been extubated. The Swan-Ganz catheter has been removed. There has been interval removal of the bilateral chest tubes.  The lungs are adequately inflated. The interstitial markings are mildly increased. There is no alveolar infiltrate. There is no pneumothorax or significant pleural effusion. The cardiac silhouette remains enlarged. There are 6 intact sternal wires. The right internal jugular Cordis sheath tip projects at the junction of the right internal jugular vein with the right subclavian vein.  IMPRESSION: Interval removal of most support tubes. Stable mild interstitial edema with stable cardiomegaly interval improvement in left lower lobe atelectasis. There is no pneumothorax or pleural effusion.   Electronically Signed  By: David Martinique M.D.  On: 03/21/2015 07:27  Assessment/Plan: S/P Procedure(s) (LRB): CORONARY ARTERY BYPASS GRAFTING (CABG) x  three, using left internal mammary artery and right leg greater saphenous vein harvested endoscopically (N/A) INTRAOPERATIVE TRANSESOPHAGEAL ECHOCARDIOGRAM (N/A)  He is hemodynamically stable this am off levophed. His Co-ox is 52% this am on milrinone 0.125. 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   Volume excess improved with diuresis but still has edema on CXR so will continue lasix drip for now.  CKD: his creat is 1.89 this am. It was 1.19 last month so I suspect that he has some chronic kidney disease with  acute worsening due to MI, contrast, surgery.   Postop atrial fib: back in sinus on amio. Will continue IV today.  Mobilize and work on [place].  SCD's for DVT prophylaxis.    LOS: 3 days    Gaye Pollack 03/21/2015

## 2015-03-21 NOTE — Care Management Note (Signed)
Case Management Note  Patient Details  Name: Miguel Vazquez MRN: [id number] Date of Birth: 06-26-1940  Subjective/Objective:      Lives with wife, has son and daughter close that also assist.  Plan for discharge home with family at this time.  Will follow as progresses.               Action/Plan:   Expected Discharge Date:  03/26/15               Expected Discharge Plan:  Home/Self Care  In-House Referral:     Discharge planning Services  CM Consult  Post Acute Care Choice:    Choice offered to:     DME Arranged:    DME Agency:     HH Arranged:    HH Agency:     Status of Service:  In process, will continue to follow  Medicare Important Message Given:    Date Medicare IM Given:    Medicare IM give by:    Date Additional Medicare IM Given:    Additional Medicare Important Message give by:     If discussed at [place] of Stay Meetings, dates discussed:    Additional Comments:  Vergie Living, RN 03/21/2015, 12:05 PM

## 2015-03-21 NOTE — Care Management Important Message (Signed)
Important Message  Patient Details  Name: Miguel Vazquez MRN: [id number] Date of Birth: 08-23-1940   Medicare Important Message Given:  Yes    Nathen May 03/21/2015, 1:14 PM

## 2015-03-21 NOTE — Plan of Care (Signed)
Problem: Phase II - Intermediate Post-Op Goal: Maintain Hemodynamic Stability Outcome: Progressing Weaning drips as tolerated.

## 2015-03-21 NOTE — Progress Notes (Signed)
Patient ID: Miguel Vazquez, male   DOB: 03-29-1941, 74 y.o.   MRN: [id number] EVENING ROUNDS NOTE :     [address]       [place] [address]             [phone]                 3 Days Post-Op Procedure(s) (LRB): CORONARY ARTERY BYPASS GRAFTING (CABG) x  three, using left internal mammary artery and right leg greater saphenous vein harvested endoscopically (N/A) INTRAOPERATIVE TRANSESOPHAGEAL ECHOCARDIOGRAM (N/A)  Total Length of Stay:  LOS: 3 days  BP 85/69 mmHg  Pulse 95  Temp(Src) 98.3 F (36.8 C) (Oral)  Resp 27  Ht 5\' 8"  (1.727 m)  Wt 194 lb 3.6 oz (88.1 kg)  BMI 29.54 kg/m2  SpO2 100%  .Intake/Output      12/18 0701 - 12/19 0700 12/19 0701 - 12/20 0700   I.V. (mL/kg) 2154.2 (24.5) 517.7 (5.9)   NG/GT     IV Piggyback     Total Intake(mL/kg) 2154.2 (24.5) 517.7 (5.9)   Urine (mL/kg/hr) 3200 (1.5) 855 (0.9)   Emesis/NG output     Chest Tube     Total Output 3200 855   Net -1045.8 -337.3          . sodium chloride Stopped (03/21/15 0800)  . sodium chloride    . sodium chloride Stopped (03/21/15 0800)  . amiodarone 30 mg/hr (03/21/15 1700)  . furosemide (LASIX) infusion 10 mg/hr (03/21/15 1700)  . lactated ringers 20 mL/hr at 03/21/15 1700  . lactated ringers Stopped (03/21/15 0800)  . milrinone 0.125 mcg/kg/min (03/21/15 1700)  . norepinephrine (LEVOPHED) Adult infusion 2 mcg/min (03/21/15 1700)     Lab Results  Component Value Date   WBC 9.1 03/21/2015   HGB 9.5* 03/21/2015   HCT 27.0* 03/21/2015   PLT 125* 03/21/2015   GLUCOSE 118* 03/21/2015   CHOL 165 05/21/2014   TRIG 173.0* 05/21/2014   HDL 38.90* 05/21/2014   LDLDIRECT 84.1 05/21/2013   LDLCALC 92 05/21/2014   ALT 30 03/20/2015   AST 100* 03/20/2015   NA 131* 03/21/2015   K 3.9 03/21/2015   CL 100* 03/21/2015   CREATININE 1.89* 03/21/2015   BUN 40* 03/21/2015   CO2 20* 03/21/2015   INR 1.63* 03/18/2015   HGBA1C 6.8* 12/08/2014   MICROALBUR 6.8* 11/21/2009    Chronic Kidney Disease   Stage I     GFR >90  Stage II    GFR 60-89  Stage IIIA GFR 45-59  Stage IIIB GFR 30-44  Stage IV   GFR 15-29  Stage V    GFR  <15  Lab Results  Component Value Date   CREATININE 1.89* 03/21/2015   Estimated Creatinine Clearance: 37.6 mL/min (by C-G formula based on Cr of 1.89). Back on low dose levophed for low BP Holding sinus  Miguel Isaac MD  Beeper [phone] Office [phone] 03/21/2015 6:00 PM

## 2015-03-21 NOTE — Progress Notes (Addendum)
[place]  [address] [place], [place] [address] Phone: [phone] FAX: [phone]   [facility] [id number] 12-29-1940   Assessment  Guarded but slowly improving  Problem List:   Principal Problem:   ST elevation (STEMI) myocardial infarction involving right coronary artery [facility]) Active Problems:   Type 2 diabetes mellitus, controlled ([place])   Hyperlipidemia   Essential hypertension   History of AAA (abdominal aortic aneurysm) repair   S/P CABG x 3   Scrotal inguinal hernia s/p lap repair w mesh 03/15/2015   3 Days Post-Op  03/18/2015  Procedure(s): CORONARY ARTERY BYPASS GRAFTING (CABG) x  three, using left internal mammary artery and right leg greater saphenous vein harvested endoscopically INTRAOPERATIVE TRANSESOPHAGEAL ECHOCARDIOGRAM  Post-Op 03/15/2015:  Laparoscopic repair of inguinal (scrotal) & femoral hernias with mesh.  Plan:  -weaning pressors as tolerated -agree w diuresis as tolerated in setting of CKD a challenge -adv diet -Bowel regimen -VTE prophylaxis- SCDs, etc -mobilize as tolerated to help recovery  I updated the patient's status to the patient & the patient's spouse.  Recommendations were made.  Questions were answered.  They expressed understanding & appreciation.   Adin Hector, M.D., F.A.C.S. Gastrointestinal and Minimally Invasive Surgery [facility] [address], [place], [place] [address] [phone] Main / Paging   03/21/2015  Subjective:  Events noted with emergent cath & CABG Extubated Agitated/confused last night - mildly confused today but better Wife at bedside Both very appreciative of his care. RNs just outside ICU room   Objective:  Vital signs:  Filed Vitals:   03/21/15 0615 03/21/15 0630 03/21/15 0645 03/21/15 0700  BP: 83/63 104/68  104/71  Pulse: 54 34 98 97  Temp:      TempSrc:      Resp: _0 Height:      Weight:      SpO2: 91% 87% 100% 100%       Intake/Output   Yesterday:  12/18 0701 - 12/19 0700 In: 2154.2 [I.V.:2154.2] Out: 3200 [Urine:3200] This shift:     Bowel function:  Flatus: y  BM: y  Drain: foley clear yellow  Physical Exam:  General: Pt awake/alert/oriented x4 in mild acute distress.  Tired but not toxic Eyes: PERRL, normal EOM.  Sclera clear.  No icterus Neuro: CN II-XII intact w/o focal sensory/motor deficits. Lymph: No head/neck/groin lymphadenopathy Psych:  No delerium/psychosis/paranoia.  Mildly groggy at times but overall  HENT: Normocephalic, Mucus membranes moist.  No thrush Neck: Supple, No tracheal deviation Chest: Mild chest wall pain w good excursion.  Sternotomy incision c/d/i CV:  Pulses intact.  Regular rhythm MS: Normal AROM mjr joints.  No obvious deformity Abdomen: Soft.  Nondistended.  Mildly tender at incisions only.  No evidence of peritonitis.  No incarcerated hernias. GU:  NEMG.  Moderate eccymosis at scrotum - old purple. Ext:  SCDs BLE.  No mjr edema.  No cyanosis Skin: No petechiae / purpura  Results:   Labs: Results for orders placed or performed during the hospital encounter of 03/18/15 (from the past 48 hour(s))  Glucose, capillary     Status: None   Collection Time: 03/19/15  7:55 AM  Result Value Ref Range   Glucose-Capillary 89 65 - 99 mg/dL  Glucose, capillary     Status: Abnormal   Collection Time: 03/19/15  9:00 AM  Result Value Ref Range   Glucose-Capillary 109 (H) 65 - 99 mg/dL  MRSA PCR Screening     Status: None   Collection Time: 03/19/15  9:34 AM  Result Value Ref Range   MRSA by PCR NEGATIVE NEGATIVE    Comment:        The GeneXpert MRSA Assay (FDA approved for NASAL specimens only), is one component of a comprehensive MRSA colonization surveillance program. It is not intended to diagnose MRSA infection nor to guide or monitor treatment for MRSA infections.   Glucose, capillary      Status: Abnormal   Collection Time: 03/19/15 10:06 AM  Result Value Ref Range   Glucose-Capillary 112 (H) 65 - 99 mg/dL  Glucose, capillary     Status: Abnormal   Collection Time: 03/19/15 11:09 AM  Result Value Ref Range   Glucose-Capillary 119 (H) 65 - 99 mg/dL  Glucose, capillary     Status: Abnormal   Collection Time: 03/19/15 12:15 PM  Result Value Ref Range   Glucose-Capillary 125 (H) 65 - 99 mg/dL  Glucose, capillary     Status: Abnormal   Collection Time: 03/19/15  1:05 PM  Result Value Ref Range   Glucose-Capillary 133 (H) 65 - 99 mg/dL  Glucose, capillary     Status: None   Collection Time: 03/19/15  2:10 PM  Result Value Ref Range   Glucose-Capillary 99 65 - 99 mg/dL  Glucose, capillary     Status: None   Collection Time: 03/19/15  3:13 PM  Result Value Ref Range   Glucose-Capillary 82 65 - 99 mg/dL  Glucose, capillary     Status: None   Collection Time: 03/19/15  4:00 PM  Result Value Ref Range   Glucose-Capillary 83 65 - 99 mg/dL  I-STAT, chem 8     Status: Abnormal   Collection Time: 03/19/15  4:35 PM  Result Value Ref Range   Sodium 133 (L) 135 - 145 mmol/L   Potassium 4.1 3.5 - 5.1 mmol/L   Chloride 101 101 - 111 mmol/L   BUN 36 (H) 6 - 20 mg/dL   Creatinine, Ser 1.50 (H) 0.61 - 1.24 mg/dL   Glucose, Bld 98 65 - 99 mg/dL   Calcium, Ion 1.24 1.13 - 1.30 mmol/L   TCO2 21 0 - 100 mmol/L   Hemoglobin 8.5 (L) 13.0 - 17.0 g/dL   HCT 25.0 (L) 39.0 - 52.0 %  Magnesium     Status: None   Collection Time: 03/19/15  4:52 PM  Result Value Ref Range   Magnesium 2.3 1.7 - 2.4 mg/dL  CBC     Status: Abnormal   Collection Time: 03/19/15  4:52 PM  Result Value Ref Range   WBC 10.1 4.0 - 10.5 K/uL   RBC 2.68 (L) 4.22 - 5.81 MIL/uL   Hemoglobin 8.4 (L) 13.0 - 17.0 g/dL   HCT 25.1 (L) 39.0 - 52.0 %   MCV 93.7 78.0 - 100.0 fL   MCH 31.3 26.0 - 34.0 pg   MCHC 33.5 30.0 - 36.0 g/dL   RDW 14.8 11.5 - 15.5 %   Platelets 103 (L) 150 - 400 K/uL    Comment: CONSISTENT  WITH PREVIOUS RESULT  Creatinine, serum     Status: Abnormal   Collection Time: 03/19/15  4:52 PM  Result Value Ref Range   Creatinine, Ser 1.70 (H) 0.61 - 1.24 mg/dL   GFR calc non Af Amer 38 (L) >60 mL/min   GFR calc Af Amer 44 (L) >60 mL/min    Comment: (NOTE) The eGFR has been calculated using  the CKD EPI equation. This calculation has not been validated in all clinical situations. eGFR's persistently <60 mL/min signify possible Chronic Kidney Disease.   Glucose, capillary     Status: Abnormal   Collection Time: 03/19/15  5:07 PM  Result Value Ref Range   Glucose-Capillary 127 (H) 65 - 99 mg/dL  Glucose, capillary     Status: Abnormal   Collection Time: 03/19/15  6:09 PM  Result Value Ref Range   Glucose-Capillary 155 (H) 65 - 99 mg/dL  Glucose, capillary     Status: Abnormal   Collection Time: 03/19/15  7:04 PM  Result Value Ref Range   Glucose-Capillary 155 (H) 65 - 99 mg/dL  Glucose, capillary     Status: Abnormal   Collection Time: 03/19/15  8:07 PM  Result Value Ref Range   Glucose-Capillary 122 (H) 65 - 99 mg/dL  Glucose, capillary     Status: None   Collection Time: 03/19/15  9:06 PM  Result Value Ref Range   Glucose-Capillary 93 65 - 99 mg/dL  Glucose, capillary     Status: None   Collection Time: 03/19/15 10:05 PM  Result Value Ref Range   Glucose-Capillary 93 65 - 99 mg/dL  Glucose, capillary     Status: Abnormal   Collection Time: 03/19/15 11:06 PM  Result Value Ref Range   Glucose-Capillary 104 (H) 65 - 99 mg/dL  Glucose, capillary     Status: None   Collection Time: 03/20/15 12:15 AM  Result Value Ref Range   Glucose-Capillary 97 65 - 99 mg/dL  Glucose, capillary     Status: Abnormal   Collection Time: 03/20/15  1:04 AM  Result Value Ref Range   Glucose-Capillary 103 (H) 65 - 99 mg/dL  Glucose, capillary     Status: Abnormal   Collection Time: 03/20/15  2:06 AM  Result Value Ref Range   Glucose-Capillary 155 (H) 65 - 99 mg/dL  CBC     Status:  Abnormal   Collection Time: 03/20/15  2:24 AM  Result Value Ref Range   WBC 12.1 (H) 4.0 - 10.5 K/uL   RBC 2.83 (L) 4.22 - 5.81 MIL/uL   Hemoglobin 8.9 (L) 13.0 - 17.0 g/dL   HCT 26.5 (L) 39.0 - 52.0 %   MCV 93.6 78.0 - 100.0 fL   MCH 31.4 26.0 - 34.0 pg   MCHC 33.6 30.0 - 36.0 g/dL   RDW 14.6 11.5 - 15.5 %   Platelets 119 (L) 150 - 400 K/uL    Comment: CONSISTENT WITH PREVIOUS RESULT  Comprehensive metabolic panel     Status: Abnormal   Collection Time: 03/20/15  2:24 AM  Result Value Ref Range   Sodium 130 (L) 135 - 145 mmol/L   Potassium 3.7 3.5 - 5.1 mmol/L   Chloride 101 101 - 111 mmol/L   CO2 20 (L) 22 - 32 mmol/L   Glucose, Bld 148 (H) 65 - 99 mg/dL   BUN 38 (H) 6 - 20 mg/dL   Creatinine, Ser 1.71 (H) 0.61 - 1.24 mg/dL   Calcium 8.5 (L) 8.9 - 10.3 mg/dL   Total Protein 4.9 (L) 6.5 - 8.1 g/dL   Albumin 2.8 (L) 3.5 - 5.0 g/dL   AST 100 (H) 15 - 41 U/L   ALT 30 17 - 63 U/L   Alkaline Phosphatase 44 38 - 126 U/L   Total Bilirubin 1.0 0.3 - 1.2 mg/dL   GFR calc non Af Amer 38 (L) >60 mL/min   GFR calc Af  Amer 44 (L) >60 mL/min    Comment: (NOTE) The eGFR has been calculated using the CKD EPI equation. This calculation has not been validated in all clinical situations. eGFR's persistently <60 mL/min signify possible Chronic Kidney Disease.    Anion gap 9 5 - 15  Glucose, capillary     Status: Abnormal   Collection Time: 03/20/15  3:09 AM  Result Value Ref Range   Glucose-Capillary 139 (H) 65 - 99 mg/dL  Glucose, capillary     Status: Abnormal   Collection Time: 03/20/15  4:09 AM  Result Value Ref Range   Glucose-Capillary 130 (H) 65 - 99 mg/dL  Glucose, capillary     Status: Abnormal   Collection Time: 03/20/15  5:03 AM  Result Value Ref Range   Glucose-Capillary 121 (H) 65 - 99 mg/dL  Glucose, capillary     Status: Abnormal   Collection Time: 03/20/15  6:02 AM  Result Value Ref Range   Glucose-Capillary 120 (H) 65 - 99 mg/dL  Glucose, capillary     Status:  Abnormal   Collection Time: 03/20/15  7:06 AM  Result Value Ref Range   Glucose-Capillary 160 (H) 65 - 99 mg/dL  Glucose, capillary     Status: Abnormal   Collection Time: 03/20/15  7:50 AM  Result Value Ref Range   Glucose-Capillary 141 (H) 65 - 99 mg/dL   Comment 1 Arterial Specimen   Glucose, capillary     Status: Abnormal   Collection Time: 03/20/15  8:56 AM  Result Value Ref Range   Glucose-Capillary 105 (H) 65 - 99 mg/dL  Glucose, capillary     Status: None   Collection Time: 03/20/15  9:59 AM  Result Value Ref Range   Glucose-Capillary 92 65 - 99 mg/dL  I-STAT 3, arterial blood gas (G3+)     Status: Abnormal   Collection Time: 03/20/15 10:13 AM  Result Value Ref Range   pH, Arterial 7.389 7.350 - 7.450   pCO2 arterial 34.5 (L) 35.0 - 45.0 mmHg   pO2, Arterial 143.0 (H) 80.0 - 100.0 mmHg   Bicarbonate 20.8 20.0 - 24.0 mEq/L   TCO2 22 0 - 100 mmol/L   O2 Saturation 99.0 %   Acid-base deficit 4.0 (H) 0.0 - 2.0 mmol/L   Patient temperature 37.2 C    Collection site ARTERIAL LINE    Drawn by Operator    Sample type ARTERIAL   Glucose, capillary     Status: None   Collection Time: 03/20/15 11:08 AM  Result Value Ref Range   Glucose-Capillary 81 65 - 99 mg/dL  Glucose, capillary     Status: Abnormal   Collection Time: 03/20/15 12:14 PM  Result Value Ref Range   Glucose-Capillary 111 (H) 65 - 99 mg/dL  I-STAT 3, arterial blood gas (G3+)     Status: Abnormal   Collection Time: 03/20/15 12:34 PM  Result Value Ref Range   pH, Arterial 7.363 7.350 - 7.450   pCO2 arterial 30.0 (L) 35.0 - 45.0 mmHg   pO2, Arterial 71.0 (L) 80.0 - 100.0 mmHg   Bicarbonate 17.0 (L) 20.0 - 24.0 mEq/L   TCO2 18 0 - 100 mmol/L   O2 Saturation 93.0 %   Acid-base deficit 7.0 (H) 0.0 - 2.0 mmol/L   Patient temperature 37.5 C    Collection site ARTERIAL LINE    Drawn by Operator    Sample type ARTERIAL   Glucose, capillary     Status: Abnormal   Collection Time: 03/20/15  1:23 PM  Result Value  Ref Range   Glucose-Capillary 109 (H) 65 - 99 mg/dL  Glucose, capillary     Status: Abnormal   Collection Time: 03/20/15  2:11 PM  Result Value Ref Range   Glucose-Capillary 110 (H) 65 - 99 mg/dL  Glucose, capillary     Status: Abnormal   Collection Time: 03/20/15  3:17 PM  Result Value Ref Range   Glucose-Capillary 112 (H) 65 - 99 mg/dL  Glucose, capillary     Status: None   Collection Time: 03/20/15  3:49 PM  Result Value Ref Range   Glucose-Capillary 98 65 - 99 mg/dL  Glucose, capillary     Status: Abnormal   Collection Time: 03/20/15  4:50 PM  Result Value Ref Range   Glucose-Capillary 102 (H) 65 - 99 mg/dL  Basic metabolic panel     Status: Abnormal   Collection Time: 03/20/15  5:02 PM  Result Value Ref Range   Sodium 131 (L) 135 - 145 mmol/L   Potassium 4.3 3.5 - 5.1 mmol/L   Chloride 103 101 - 111 mmol/L   CO2 20 (L) 22 - 32 mmol/L   Glucose, Bld 102 (H) 65 - 99 mg/dL   BUN 38 (H) 6 - 20 mg/dL   Creatinine, Ser 1.80 (H) 0.61 - 1.24 mg/dL   Calcium 8.3 (L) 8.9 - 10.3 mg/dL   GFR calc non Af Amer 36 (L) >60 mL/min   GFR calc Af Amer 41 (L) >60 mL/min    Comment: (NOTE) The eGFR has been calculated using the CKD EPI equation. This calculation has not been validated in all clinical situations. eGFR's persistently <60 mL/min signify possible Chronic Kidney Disease.    Anion gap 8 5 - 15  Glucose, capillary     Status: Abnormal   Collection Time: 03/20/15  5:55 PM  Result Value Ref Range   Glucose-Capillary 147 (H) 65 - 99 mg/dL  Glucose, capillary     Status: Abnormal   Collection Time: 03/20/15  6:42 PM  Result Value Ref Range   Glucose-Capillary 130 (H) 65 - 99 mg/dL  Glucose, capillary     Status: Abnormal   Collection Time: 03/20/15  7:45 PM  Result Value Ref Range   Glucose-Capillary 109 (H) 65 - 99 mg/dL  Glucose, capillary     Status: None   Collection Time: 03/20/15  9:00 PM  Result Value Ref Range   Glucose-Capillary 75 65 - 99 mg/dL  Glucose,  capillary     Status: None   Collection Time: 03/20/15 10:05 PM  Result Value Ref Range   Glucose-Capillary 96 65 - 99 mg/dL  Glucose, capillary     Status: Abnormal   Collection Time: 03/20/15 11:03 PM  Result Value Ref Range   Glucose-Capillary 109 (H) 65 - 99 mg/dL  Glucose, capillary     Status: Abnormal   Collection Time: 03/21/15 12:07 AM  Result Value Ref Range   Glucose-Capillary 133 (H) 65 - 99 mg/dL   Comment 1 Notify RN    Comment 2 Document in Chart   Carboxyhemoglobin     Status: Abnormal   Collection Time: 03/21/15  3:35 AM  Result Value Ref Range   Total hemoglobin 8.2 (L) 13.5 - 18.0 g/dL   O2 Saturation 52.3 %   Carboxyhemoglobin 1.4 0.5 - 1.5 %   Methemoglobin 0.9 0.0 - 1.5 %  Basic metabolic panel     Status: Abnormal   Collection Time: 03/21/15  4:02 AM  Result  Value Ref Range   Sodium 131 (L) 135 - 145 mmol/L   Potassium 3.9 3.5 - 5.1 mmol/L   Chloride 100 (L) 101 - 111 mmol/L   CO2 20 (L) 22 - 32 mmol/L   Glucose, Bld 118 (H) 65 - 99 mg/dL   BUN 40 (H) 6 - 20 mg/dL   Creatinine, Ser 1.89 (H) 0.61 - 1.24 mg/dL   Calcium 8.2 (L) 8.9 - 10.3 mg/dL   GFR calc non Af Amer 34 (L) >60 mL/min   GFR calc Af Amer 39 (L) >60 mL/min    Comment: (NOTE) The eGFR has been calculated using the CKD EPI equation. This calculation has not been validated in all clinical situations. eGFR's persistently <60 mL/min signify possible Chronic Kidney Disease.    Anion gap 11 5 - 15  CBC     Status: Abnormal   Collection Time: 03/21/15  4:02 AM  Result Value Ref Range   WBC 9.1 4.0 - 10.5 K/uL   RBC 2.89 (L) 4.22 - 5.81 MIL/uL   Hemoglobin 9.5 (L) 13.0 - 17.0 g/dL   HCT 27.0 (L) 39.0 - 52.0 %   MCV 93.4 78.0 - 100.0 fL   MCH 32.9 26.0 - 34.0 pg   MCHC 35.2 30.0 - 36.0 g/dL   RDW 14.4 11.5 - 15.5 %   Platelets 125 (L) 150 - 400 K/uL  Magnesium     Status: None   Collection Time: 03/21/15  4:37 AM  Result Value Ref Range   Magnesium 1.8 1.7 - 2.4 mg/dL    Imaging /  Studies: Dg Chest Port 1 View  03/21/2015  CLINICAL DATA:  Status post CABG, history of diabetes and previous smoking. EXAM: PORTABLE CHEST 1 VIEW COMPARISON:  Portable chest x-ray of March 20, 2015. FINDINGS: The trachea and esophagus have been extubated. The Swan-Ganz catheter has been removed. There has been interval removal of the bilateral chest tubes. The lungs are adequately inflated. The interstitial markings are mildly increased. There is no alveolar infiltrate. There is no pneumothorax or significant pleural effusion. The cardiac silhouette remains enlarged. There are 6 intact sternal wires. The right internal jugular Cordis sheath tip projects at the junction of the right internal jugular vein with the right subclavian vein. IMPRESSION: Interval removal of most support tubes. Stable mild interstitial edema with stable cardiomegaly interval improvement in left lower lobe atelectasis. There is no pneumothorax or pleural effusion. Electronically Signed   By: David  Martinique M.D.   On: 03/21/2015 07:27   Dg Chest Port 1 View  03/20/2015  CLINICAL DATA:  74 year old male status post CABG. EXAM: PORTABLE CHEST 1 VIEW COMPARISON:  03/19/2015 and prior exams FINDINGS: Cardiomegaly, CABG changes, endotracheal tube with tip 4.2 cm above the carina, Swan-Ganz catheter with tip overlying the main pulmonary artery, thoracostomy tubes, and NG tube entering the stomach with tip off the field of view again noted. Pulmonary vascular congestion and bibasilar atelectasis again noted. There is no evidence of pneumothorax. IMPRESSION: Unchanged appearance of the chest with pulmonary vascular congestion, bibasilar atelectasis and support apparatus as described. No evidence of pneumothorax. Electronically Signed   By: Margarette Canada M.D.   On: 03/20/2015 08:15    Medications / Allergies: per chart  Antibiotics: Anti-infectives    Start     Dose/Rate Route Frequency Ordered Stop   03/19/15 0500  cefUROXime (ZINACEF)  1.5 g in dextrose 5 % 50 mL IVPB     1.5 g 100 mL/hr over 30 Minutes Intravenous  Every 12 hours 03/18/15 2219 03/20/15 1721   03/19/15 0330  vancomycin (VANCOCIN) IVPB 1000 mg/200 mL premix     1,000 mg 200 mL/hr over 60 Minutes Intravenous  Once 03/18/15 2219 03/19/15 0423   03/18/15 1545  vancomycin (VANCOCIN) 1,250 mg in sodium chloride 0.9 % 250 mL IVPB     1,250 mg 166.7 mL/hr over 90 Minutes Intravenous To Surgery 03/18/15 1544 03/18/15 1653   03/18/15 1545  cefUROXime (ZINACEF) 1.5 g in dextrose 5 % 50 mL IVPB     1.5 g 100 mL/hr over 30 Minutes Intravenous To Surgery 03/18/15 1544 03/18/15 2035   03/18/15 1545  cefUROXime (ZINACEF) 750 mg in dextrose 5 % 50 mL IVPB  Status:  Discontinued     750 mg 100 mL/hr over 30 Minutes Intravenous To Surgery 03/18/15 1541 03/18/15 2204        Note: Portions of this report may have been transcribed using voice recognition software. Every effort was made to ensure accuracy; however, inadvertent computerized transcription errors may be present.   Any transcriptional errors that result from this process are unintentional.     Adin Hector, M.D., F.A.C.S. Gastrointestinal and Minimally Invasive Surgery [facility] [address], [place], [place] [address] [phone] Main / Paging   03/21/2015  CARE TEAM:  PCP: Eulas Post, MD  Outpatient Care Team: Patient Care Team: Eulas Post, MD as PCP - General Michael Boston, MD as Consulting Physician (General Surgery) Gatha Mayer, MD as Consulting Physician (Gastroenterology)  Inpatient Treatment Team: Treatment Team: Attending Provider: Melrose Nakayama, MD; Consulting Physician: Melrose Nakayama, MD; Consulting Physician: Michae Kava Lbcardiology, MD; Registered Nurse: Paulla Fore, RN; Registered Nurse: Macario Golds, RN; Respiratory Therapist: [name], RRT; Consulting Physician: Michael Boston, MD

## 2015-03-22 ENCOUNTER — Inpatient Hospital Stay (HOSPITAL_COMMUNITY): Payer: Medicare HMO

## 2015-03-22 LAB — CBC
HCT: 27.4 % — ABNORMAL LOW (ref 39.0–52.0)
HEMOGLOBIN: 9.3 g/dL — AB (ref 13.0–17.0)
MCH: 31.6 pg (ref 26.0–34.0)
MCHC: 33.9 g/dL (ref 30.0–36.0)
MCV: 93.2 fL (ref 78.0–100.0)
Platelets: 177 10*3/uL (ref 150–400)
RBC: 2.94 MIL/uL — ABNORMAL LOW (ref 4.22–5.81)
RDW: 14.2 % (ref 11.5–15.5)
WBC: 10.5 10*3/uL (ref 4.0–10.5)

## 2015-03-22 LAB — TYPE AND SCREEN
ABO/RH(D): AB POS
Antibody Screen: NEGATIVE
UNIT DIVISION: 0
UNIT DIVISION: 0
Unit division: 0
Unit division: 0

## 2015-03-22 LAB — CARBOXYHEMOGLOBIN
CARBOXYHEMOGLOBIN: 1.7 % — AB (ref 0.5–1.5)
Methemoglobin: 1 % (ref 0.0–1.5)
O2 SAT: 52.3 %
TOTAL HEMOGLOBIN: 9.1 g/dL — AB (ref 13.5–18.0)

## 2015-03-22 LAB — BASIC METABOLIC PANEL
ANION GAP: 9 (ref 5–15)
BUN: 46 mg/dL — ABNORMAL HIGH (ref 6–20)
CALCIUM: 8.4 mg/dL — AB (ref 8.9–10.3)
CHLORIDE: 101 mmol/L (ref 101–111)
CO2: 25 mmol/L (ref 22–32)
CREATININE: 1.95 mg/dL — AB (ref 0.61–1.24)
GFR calc non Af Amer: 32 mL/min — ABNORMAL LOW (ref >60)
GFR, EST AFRICAN AMERICAN: 38 mL/min — AB (ref >60)
Glucose, Bld: 108 mg/dL — ABNORMAL HIGH (ref 65–99)
Potassium: 3.7 mmol/L (ref 3.5–5.1)
SODIUM: 135 mmol/L (ref 135–145)

## 2015-03-22 LAB — GLUCOSE, CAPILLARY
GLUCOSE-CAPILLARY: 126 mg/dL — AB (ref 65–99)
Glucose-Capillary: 148 mg/dL — ABNORMAL HIGH (ref 65–99)
Glucose-Capillary: 150 mg/dL — ABNORMAL HIGH (ref 65–99)
Glucose-Capillary: 82 mg/dL (ref 65–99)
Glucose-Capillary: 84 mg/dL (ref 65–99)
Glucose-Capillary: 94 mg/dL (ref 65–99)

## 2015-03-22 IMAGING — DX DG CHEST 1V PORT
1 series · 1 of 1 positions shown · non-contrast
Comparison: Single view of the chest [DATE] and [DATE].

CLINICAL DATA: Status post CABG [DATE].

EXAM:
PORTABLE CHEST 1 VIEW

[chest ap]
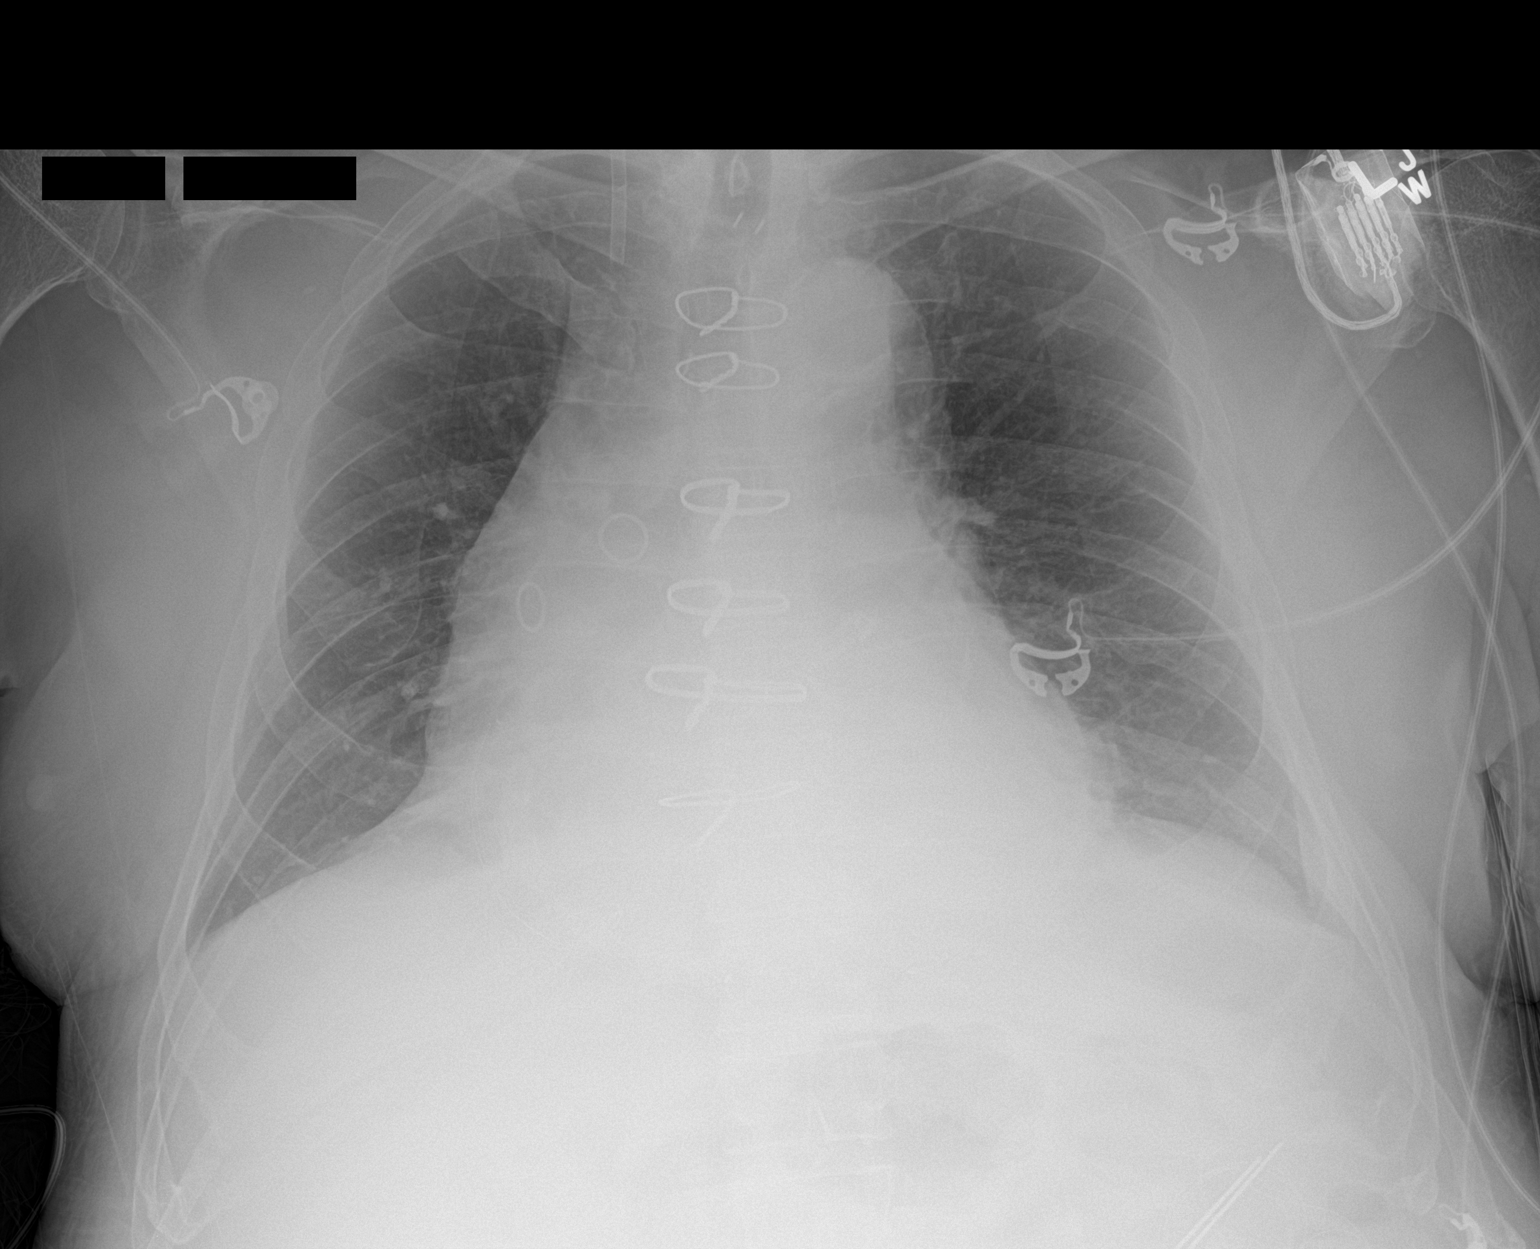

[1 of 1 positions shown; findings below may reference images not displayed]

FINDINGS: Right IJ sheath remains in place. Mild interstitial edema seen on
yesterday's examination has resolved. The lungs appear clear. There
is cardiomegaly. No pneumothorax.
IMPRESSION: Resolved interstitial edema.  No new abnormality.

## 2015-03-22 MED ORDER — ENOXAPARIN SODIUM 30 MG/0.3ML ~~LOC~~ SOLN
30.0000 mg | SUBCUTANEOUS | Status: DC
  Administered 2015-03-22: 30 mg via SUBCUTANEOUS
  Filled 2015-03-22: qty 0.3

## 2015-03-22 MED ORDER — TRAMADOL HCL 50 MG PO TABS: 50.0000 mg | ORAL_TABLET | Freq: Two times a day (BID) | ORAL | Status: DC | PRN

## 2015-03-22 MED ORDER — MILRINONE IN DEXTROSE 20 MG/100ML IV SOLN
0.1250 ug/kg/min | INTRAVENOUS | Status: DC
  Administered 2015-03-22: 0.125 ug/kg/min via INTRAVENOUS
  Filled 2015-03-22: qty 100

## 2015-03-22 MED ORDER — POTASSIUM CHLORIDE 10 MEQ/50ML IV SOLN
10.0000 meq | INTRAVENOUS | Status: AC
  Administered 2015-03-22 (×3): 10 meq via INTRAVENOUS
  Filled 2015-03-22 (×3): qty 50

## 2015-03-22 NOTE — Progress Notes (Signed)
Patient ID: Miguel Vazquez, male   DOB: 07/21/1940, 74 y.o.   MRN: [id number] TCTS DAILY ICU PROGRESS NOTE                   [place]            [place] [address]          [phone]   4 Days Post-Op Procedure(s) (LRB): CORONARY ARTERY BYPASS GRAFTING (CABG) x  three, using left internal mammary artery and right leg greater saphenous vein harvested endoscopically (N/A) INTRAOPERATIVE TRANSESOPHAGEAL ECHOCARDIOGRAM (N/A)  Total Length of Stay:  LOS: 4 days   Subjective: Awake and neuro inatct  Objective: Vital signs in last 24 hours: Temp:  [97.6 F (36.4 C)-98.7 F (37.1 C)] 98.1 F (36.7 C) (12/20 0723) Pulse Rate:  [88-105] 91 (12/20 0700) Cardiac Rhythm:  [-] Atrial paced (12/20 0400) Resp:  [16-34] 20 (12/20 0700) BP: (69-132)/(49-99) 88/59 mmHg (12/20 0700) SpO2:  [93 %-100 %] 99 % (12/20 0700) Arterial Line BP: (111-115)/(58-80) 111/80 mmHg (12/19 1000) Weight:  [185 lb 3 oz (84 kg)] 185 lb 3 oz (84 kg) (12/20 0500)  Filed Weights   03/20/15 0450 03/21/15 0500 03/22/15 0500  Weight: 195 lb 15.8 oz (88.9 kg) 194 lb 3.6 oz (88.1 kg) 185 lb 3 oz (84 kg)    Weight change: -9 lb 0.6 oz (-4.1 kg)   Hemodynamic parameters for last 24 hours:    Intake/Output from previous day: 12/19 0701 - 12/20 0700 In: 1296.4 [I.V.:1196.4; IV Piggyback:100] Out: 2835 [Urine:2835]  Intake/Output this shift:    Current Meds: Scheduled Meds: . acetaminophen  1,000 mg Oral 4 times per day   Or  . acetaminophen (TYLENOL) oral liquid 160 mg/5 mL  1,000 mg Per Tube 4 times per day  . antiseptic oral rinse  7 mL Mouth Rinse BID  . aspirin EC  325 mg Oral Daily   Or  . aspirin  324 mg Per Tube Daily  . atorvastatin  40 mg Oral q1800  . bisacodyl  10 mg Oral Daily   Or  . bisacodyl  10 mg Rectal Daily  . docusate sodium  200 mg Oral Daily  . enoxaparin (LOVENOX) injection  40 mg Subcutaneous Q24H  . insulin aspart  0-15 Units Subcutaneous 6 times per day  .  insulin detemir  15 Units Subcutaneous Daily  . pantoprazole  40 mg Oral Daily  . potassium chloride  10 mEq Intravenous Q1 Hr x 3  . saccharomyces boulardii  250 mg Oral BID  . sodium chloride  3 mL Intravenous Q12H  . tamsulosin  0.4 mg Oral Daily   Continuous Infusions: . sodium chloride Stopped (03/21/15 0800)  . sodium chloride    . sodium chloride Stopped (03/21/15 0800)  . amiodarone 30 mg/hr (03/22/15 0630)  . furosemide (LASIX) infusion 10 mg/hr (03/22/15 0600)  . lactated ringers 20 mL/hr at 03/22/15 0600  . lactated ringers Stopped (03/21/15 0800)  . milrinone 0.125 mcg/kg/min (03/22/15 0600)  . norepinephrine (LEVOPHED) Adult infusion Stopped (03/22/15 0130)   PRN Meds:.sodium chloride, lactated ringers, morphine injection, ondansetron (ZOFRAN) IV, oxyCODONE, sodium chloride, traMADol  General appearance: alert, cooperative and no distress Neurologic: intact Heart: regular rate and rhythm, S1, S2 normal, no murmur, click, rub or gallop Lungs: diminished breath sounds bibasilar Abdomen: soft, non-tender; bowel sounds normal; no masses,  no organomegaly Extremities: extremities normal, atraumatic, no cyanosis or edema and Homans sign is negative, no sign of  DVT Wound: sternum stable Foley still in due to lasix drip  Lab Results: CBC: Recent Labs  03/21/15 0402 03/22/15 0610  WBC 9.1 10.5  HGB 9.5* 9.3*  HCT 27.0* 27.4*  PLT 125* 177   BMET:  Recent Labs  03/21/15 1855 03/22/15 0415  NA 135 135  K 4.0 3.7  CL 101 101  CO2 24 25  GLUCOSE 103* 108*  BUN 43* 46*  CREATININE 2.03* 1.95*  CALCIUM 8.4* 8.4*    PT/INR: No results for input(s): LABPROT, INR in the last 72 hours. Radiology: No results found. . Acute Kidney Injury (any one)  Increase in SCr by > 0.3 within 48 hours  Increase SCr to > 1.5 times baseline  Urine volume < 0.5 ml/kg/h for 6 hrs  Stage:  Risk:   1.5x increase in creatinine or GFR decrease by 25% or UOP <0.44ml/kgperhr for 6  hrs  Injury:  2x increase in creatinine or GFR decrease by 50% or UOP < 0.18ml/kgperhr for 12 hr  Failure:3X increase in creatinine or GFR decrease by 75% or UOP < 0.81ml/kgperhr for 12 hr or                anuria 12 hrs  Loss: complete loss of kidney  function for more then 4 weeks  End-stage renal disease:Complete loss of kidney function for more then 3 months  Lab Results  Component Value Date   CREATININE 1.95* 03/22/2015   CREATININE: 1.95 mg/dL ABNORMAL (03/22/15 0415) Estimated creatinine clearance - 35.6 mL/min  Assessment/Plan: S/P Procedure(s) (LRB): CORONARY ARTERY BYPASS GRAFTING (CABG) x  three, using left internal mammary artery and right leg greater saphenous vein harvested endoscopically (N/A) INTRAOPERATIVE TRANSESOPHAGEAL ECHOCARDIOGRAM (N/A) Mobilize Diuresis Diabetes control Continue foley due to strict I&O, patient in ICU, urinary output monitoring and lasix drip Renal function stabilized, acute kidney injury,  still on lasix drip, will decrease on chest xray interstitial edema resolving  Still on low dose milrone, cox 52    Grace Isaac 03/22/2015 7:39 AM

## 2015-03-22 NOTE — Progress Notes (Signed)
UR COMPLETED  

## 2015-03-22 NOTE — Evaluation (Signed)
Physical Therapy Evaluation Patient Details Name: Miguel Vazquez MRN: [id number] DOB: 09-May-1940 Today's Date: 03/22/2015   History of Present Illness  pt is a 74 y/o male with h/o HTN and DM, admitted with 24 hour h/o severe indigestion that turned out on ECG to be consistent with ineroposterior MI.  Emergently to CATH and on 12/17 underwent CABG x3.  Clinical Impression  Pt admitted with/for CP due to STEMI, s/p CABG.  Pt currently limited functionally due to the problems listed below.  (see problems list.)  Pt will benefit from PT to maximize function and safety to be able to get home safely with available assist of family.     Follow Up Recommendations No PT follow up;Supervision for mobility/OOB (after a few days 24/7)    Equipment Recommendations  None recommended by PT    Recommendations for Other Services       Precautions / Restrictions Precautions Precautions: Sternal      Mobility  Bed Mobility                  Transfers Overall transfer level: Needs assistance Equipment used: Rolling walker (2 wheeled) Transfers: Sit to/from Stand Sit to Stand: Min assist         General transfer comment: reinforced sternal precautions.  Ambulation/Gait Ambulation/Gait assistance: Min guard Ambulation Distance (Feet): 330 Feet Assistive device:  (pushing the w/c) Gait Pattern/deviations: Step-through pattern Gait velocity: able to increase speed to cuing appreciably Gait velocity interpretation: at or above normal speed for age/gender General Gait Details: generally steady with some "waggle" of the RW  Stairs            Wheelchair Mobility    Modified Rankin (Stroke Patients Only)       Balance Overall balance assessment: Needs assistance Sitting-balance support: No upper extremity supported Sitting balance-Leahy Scale: Fair     Standing balance support: No upper extremity supported Standing balance-Leahy Scale: Poor Standing balance comment:  reliant on AD                             Pertinent Vitals/Pain Pain Assessment: Faces Pain Score: 3  Faces Pain Scale: Hurts little more Pain Location: sternal Pain Descriptors / Indicators: Discomfort Pain Intervention(s): Monitored during session    Home Living Family/patient expects to be discharged to:: Private residence Living Arrangements: Spouse/significant other;Children Available Help at Discharge: Family;Available 24 hours/day Type of Home: House Home Access: Stairs to enter Entrance Stairs-Rails: Psychiatric nurse of Steps: 4 Home Layout: Multi-level Home Equipment: Grab bars - toilet;Grab bars - tub/shower;Walker - 2 wheels;Shower seat      Prior Function Level of Independence: Independent               Hand Dominance        Extremity/Trunk Assessment   Upper Extremity Assessment: Defer to OT evaluation           Lower Extremity Assessment: Overall WFL for tasks assessed (mild proximal weakness)         Communication   Communication: No difficulties  Cognition Arousal/Alertness: Awake/alert Behavior During Therapy: WFL for tasks assessed/performed Overall Cognitive Status: Within Functional Limits for tasks assessed                      General Comments General comments (skin integrity, edema, etc.): vss through out    Exercises        Assessment/Plan    PT  Assessment Patient needs continued PT services  PT Diagnosis Difficulty walking;Generalized weakness   PT Problem List Decreased strength;Decreased activity tolerance;Decreased balance;Decreased mobility;Decreased knowledge of use of DME;Pain  PT Treatment Interventions DME instruction;Gait training;Stair training;Functional mobility training;Therapeutic activities;Patient/family education   PT Goals (Current goals can be found in the Care Plan section) Acute Rehab PT Goals Patient Stated Goal: get home PT Goal Formulation: With patient Time  For Goal Achievement: 04/05/15 Potential to Achieve Goals: Good    Frequency Min 3X/week   Barriers to discharge        Co-evaluation               End of Session   Activity Tolerance: Patient tolerated treatment well Patient left: with call bell/phone within reach (sitting EOB) Nurse Communication: Mobility status         Time: [id number] PT Time Calculation (min) (ACUTE ONLY): 22 min   Charges:   PT Evaluation $Initial PT Evaluation Tier I: 1 Procedure     PT G Codes:        Sharifa Bucholz, Tessie Fass 03/22/2015, 4:29 PM  03/22/2015  Donnella Sham, PT [phone]  (pager)

## 2015-03-22 NOTE — Progress Notes (Signed)
TCTS BRIEF SICU PROGRESS NOTE  4 Days Post-Op  S/P Procedure(s) (LRB): CORONARY ARTERY BYPASS GRAFTING (CABG) x  three, using left internal mammary artery and right leg greater saphenous vein harvested endoscopically (N/A) INTRAOPERATIVE TRANSESOPHAGEAL ECHOCARDIOGRAM (N/A)   Stable day NSR w/ intermittent Afib BP stable on low dose milrinone O2 sats 94% on RA Diuresing some  Plan: Continue current plan  Rexene Alberts, MD 03/22/2015 6:05 PM

## 2015-03-23 ENCOUNTER — Inpatient Hospital Stay (HOSPITAL_COMMUNITY): Payer: Medicare HMO

## 2015-03-23 LAB — BASIC METABOLIC PANEL
Anion gap: 11 (ref 5–15)
BUN: 40 mg/dL — ABNORMAL HIGH (ref 6–20)
CO2: 26 mmol/L (ref 22–32)
Calcium: 8.3 mg/dL — ABNORMAL LOW (ref 8.9–10.3)
Chloride: 97 mmol/L — ABNORMAL LOW (ref 101–111)
Creatinine, Ser: 1.63 mg/dL — ABNORMAL HIGH (ref 0.61–1.24)
GFR calc Af Amer: 47 mL/min — ABNORMAL LOW (ref >60)
GFR calc non Af Amer: 40 mL/min — ABNORMAL LOW (ref >60)
Glucose, Bld: 168 mg/dL — ABNORMAL HIGH (ref 65–99)
Potassium: 3.3 mmol/L — ABNORMAL LOW (ref 3.5–5.1)
Sodium: 134 mmol/L — ABNORMAL LOW (ref 135–145)

## 2015-03-23 LAB — GLUCOSE, CAPILLARY
GLUCOSE-CAPILLARY: 118 mg/dL — AB (ref 65–99)
GLUCOSE-CAPILLARY: 136 mg/dL — AB (ref 65–99)
GLUCOSE-CAPILLARY: 162 mg/dL — AB (ref 65–99)
GLUCOSE-CAPILLARY: 177 mg/dL — AB (ref 65–99)
GLUCOSE-CAPILLARY: 68 mg/dL (ref 65–99)
Glucose-Capillary: 145 mg/dL — ABNORMAL HIGH (ref 65–99)
Glucose-Capillary: 71 mg/dL (ref 65–99)
Glucose-Capillary: 86 mg/dL (ref 65–99)

## 2015-03-23 LAB — CBC
HCT: 27.1 % — ABNORMAL LOW (ref 39.0–52.0)
Hemoglobin: 9.4 g/dL — ABNORMAL LOW (ref 13.0–17.0)
MCH: 32.5 pg (ref 26.0–34.0)
MCHC: 34.7 g/dL (ref 30.0–36.0)
MCV: 93.8 fL (ref 78.0–100.0)
Platelets: 206 10*3/uL (ref 150–400)
RBC: 2.89 MIL/uL — ABNORMAL LOW (ref 4.22–5.81)
RDW: 14.4 % (ref 11.5–15.5)
WBC: 9.5 10*3/uL (ref 4.0–10.5)

## 2015-03-23 IMAGING — DX DG CHEST 1V PORT
1 series · 1 of 1 positions shown · non-contrast
Comparison: Portable chest x-ray [DATE]

CLINICAL DATA: Status post CABG on [DATE]

EXAM:
PORTABLE CHEST 1 VIEW

[chest ap]
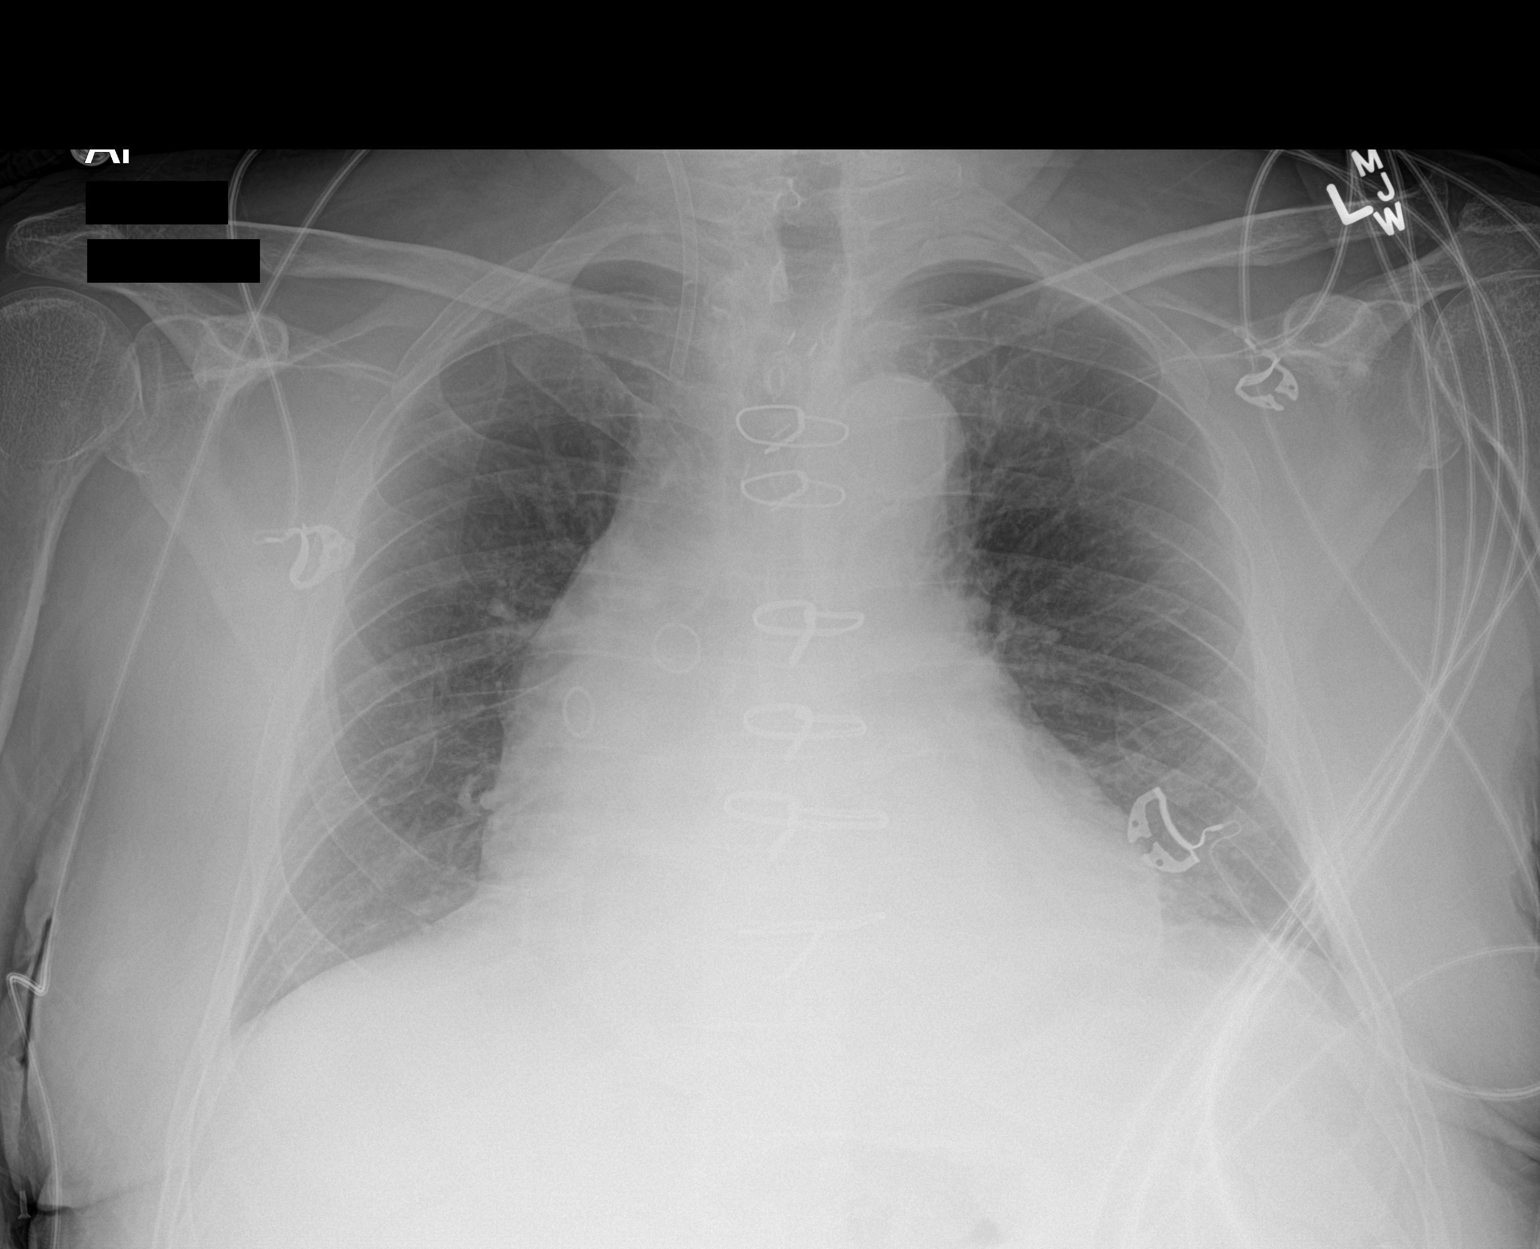

[1 of 1 positions shown; findings below may reference images not displayed]

FINDINGS: The lungs are adequately inflated. There is minimal subsegmental
atelectasis in the left lower hemi thorax. There is no pneumothorax
or pleural effusion. The cardiac silhouette remains enlarged. The
pulmonary vascularity is normal. There are 6 intact sternal wires. A
right internal jugular Cordis sheath is present.
IMPRESSION: Stable cardiomegaly. Minimal subsegmental atelectasis at the left
lung base. No pulmonary edema or or pleural effusion.

## 2015-03-23 MED ORDER — ENOXAPARIN SODIUM 40 MG/0.4ML ~~LOC~~ SOLN
40.0000 mg | SUBCUTANEOUS | Status: DC
  Administered 2015-03-23 – 2015-03-25 (×3): 40 mg via SUBCUTANEOUS
  Filled 2015-03-23 (×3): qty 0.4

## 2015-03-23 MED ORDER — FUROSEMIDE 40 MG PO TABS
40.0000 mg | ORAL_TABLET | Freq: Every day | ORAL | Status: DC
  Administered 2015-03-23 – 2015-03-26 (×4): 40 mg via ORAL
  Filled 2015-03-23 (×4): qty 1

## 2015-03-23 MED ORDER — GLUCERNA SHAKE PO LIQD
237.0000 mL | Freq: Two times a day (BID) | ORAL | Status: DC
  Administered 2015-03-23 – 2015-03-25 (×2): 237 mL via ORAL

## 2015-03-23 MED ORDER — POTASSIUM CHLORIDE CRYS ER 20 MEQ PO TBCR
40.0000 meq | EXTENDED_RELEASE_TABLET | Freq: Two times a day (BID) | ORAL | Status: AC
  Administered 2015-03-23 (×2): 40 meq via ORAL
  Filled 2015-03-23 (×2): qty 2

## 2015-03-23 MED ORDER — AMIODARONE HCL 200 MG PO TABS
400.0000 mg | ORAL_TABLET | Freq: Two times a day (BID) | ORAL | Status: DC
  Administered 2015-03-23 – 2015-03-26 (×7): 400 mg via ORAL
  Filled 2015-03-23 (×7): qty 2

## 2015-03-23 NOTE — Progress Notes (Signed)
5 Days Post-Op Procedure(s) (LRB): CORONARY ARTERY BYPASS GRAFTING (CABG) x  three, using left internal mammary artery and right leg greater saphenous vein harvested endoscopically (N/A) INTRAOPERATIVE TRANSESOPHAGEAL ECHOCARDIOGRAM (N/A) Subjective:  No complaints  Objective: Vital signs in last 24 hours: Temp:  [97.5 F (36.4 C)-98.4 F (36.9 C)] 97.5 F (36.4 C) (12/21 0728) Pulse Rate:  [80-110] 91 (12/21 0800) Cardiac Rhythm:  [-] Normal sinus rhythm (12/21 0600) Resp:  [16-29] 19 (12/21 0800) BP: (86-117)/(56-73) 98/69 mmHg (12/21 0800) SpO2:  [87 %-100 %] 91 % (12/21 0800) Weight:  [85.4 kg (188 lb 4.4 oz)] 85.4 kg (188 lb 4.4 oz) (12/21 0500)  Hemodynamic parameters for last 24 hours:    Intake/Output from previous day: 12/20 0701 - 12/21 0700 In: 1510.5 [P.O.:360; I.V.:1150.5] Out: 3800 [Urine:3800] Intake/Output this shift:    General appearance: alert and cooperative Neurologic: intact Heart: regular rate and rhythm, S1, S2 normal, no murmur, click, rub or gallop Lungs: clear to auscultation bilaterally Extremities: extremities normal, atraumatic, no cyanosis or edema Wound: incision ok  Lab Results:  Recent Labs  03/22/15 0610 03/23/15 0550  WBC 10.5 9.5  HGB 9.3* 9.4*  HCT 27.4* 27.1*  PLT 177 206   BMET:  Recent Labs  03/22/15 0415 03/23/15 0550  NA 135 134*  K 3.7 3.3*  CL 101 97*  CO2 25 26  GLUCOSE 108* 168*  BUN 46* 40*  CREATININE 1.95* 1.63*  CALCIUM 8.4* 8.3*    PT/INR: No results for input(s): LABPROT, INR in the last 72 hours. ABG    Component Value Date/Time   PHART 7.363 03/20/2015 1234   HCO3 17.0* 03/20/2015 1234   TCO2 18 03/20/2015 1234   ACIDBASEDEF 7.0* 03/20/2015 1234   O2SAT 52.3 03/22/2015 0415   CBG (last 3)   Recent Labs  03/23/15 0012 03/23/15 0345 03/23/15 0724  GLUCAP 86 162* 145*   CLINICAL DATA: Status post CABG on March 18, 2015  EXAM: PORTABLE CHEST 1 VIEW  COMPARISON: Portable  chest x-ray of March 22, 2015  FINDINGS: The lungs are adequately inflated. There is minimal subsegmental atelectasis in the left lower hemi thorax. There is no pneumothorax or pleural effusion. The cardiac silhouette remains enlarged. The pulmonary vascularity is normal. There are 6 intact sternal wires. A right internal jugular Cordis sheath is present.  IMPRESSION: Stable cardiomegaly. Minimal subsegmental atelectasis at the left lung base. No pulmonary edema or or pleural effusion.   Electronically Signed  By: David Martinique M.D.  On: 03/23/2015 07:36  Assessment/Plan: S/P Procedure(s) (LRB): CORONARY ARTERY BYPASS GRAFTING (CABG) x  three, using left internal mammary artery and right leg greater saphenous vein harvested endoscopically (N/A) INTRAOPERATIVE TRANSESOPHAGEAL ECHOCARDIOGRAM (N/A)  He is hemodynamically stable on Milrinone 0.125. Could not draw Co-ox this am due to line malfunction so will stop Milrinone and see how he does.  Postop atrial fib: maintaining sinus on amio. Switch to po.  Acute postop renal failure: creat continues to improve and diuresis. Will switch lasix to po and follow creat.  Continue IS, ambulation.   LOS: 5 days    Gaye Pollack 03/23/2015

## 2015-03-23 NOTE — Progress Notes (Signed)
Initial Nutrition Assessment  DOCUMENTATION CODES:   Not applicable  INTERVENTION:   Glucerna Shake po BID, each supplement provides 220 kcal and 10 grams of protein  NEW NUTRITION DIAGNOSIS:   Increased nutrient needs related to  (post-op healing) as evidenced by estimated needs, ongoing  GOAL:   Patient will meet greater than or equal to 90% of their needs, progressing  MONITOR:   PO intake, Supplement acceptance, Labs, Weight trends, I & O's  ASSESSMENT:  74 y/o male PMHx DM, HTN, GERD, HLD, presents w/ 24 hours of indigestion which is a chronic issue for him. This indigestion brought pt to ed. ECG show inferoposterior infarct.   Patient s/p procedure 12/16: CORONARY ARTERY BYPASS GRAFTING x 3  Patient extubated 12/18.  Currently on a Heart Healthy/Carbohydrate Modified diet.  States his appetite is fair.  PO intake this AM ~50%.  Receiving probiotic (Florastor) BID.  Amenable to oral nutrition supplements.  RD to order.  Diet Order:  Diet heart healthy/carb modified Room service appropriate?: Yes; Fluid consistency:: Thin  Skin:  surgical incisions chest, abdomen, leg  Last BM:  12/20  Height:  Ht Readings from Last 1 Encounters:  03/18/15 5\' 8"  (1.727 m)    Weight:  Wt Readings from Last 1 Encounters:  03/23/15 188 lb 4.4 oz (85.4 kg)   Wt Readings from Last 10 Encounters:  03/23/15 188 lb 4.4 oz (85.4 kg)  01/18/15 172 lb (78.019 kg)  01/13/15 172 lb (78.019 kg)  12/08/14 174 lb (78.926 kg)  11/22/14 175 lb (79.379 kg)  05/21/14 177 lb (80.287 kg)  11/18/13 180 lb (81.647 kg)  09/02/13 179 lb (81.194 kg)  08/19/13 179 lb 6.4 oz (81.375 kg)  05/21/13 181 lb (82.101 kg)   Admit weight: 163 lbs (74.1 kg)  Ideal Body Weight:  70 kg  BMI:  Body mass index is 28.63 kg/(m^2).  Estimated Nutritional Needs:   Kcal:  1900-2100   Protein:  100-110 gm   Fluid:  per MD  EDUCATION NEEDS:   No education needs identified at this time   CBG (last 3)    Recent Labs  03/23/15 0345 03/23/15 0724 03/23/15 1132  GLUCAP 162* 145* 177*    Arthur Holms, RD, LDN Pager #: [phone] After-Hours Pager #: [phone]

## 2015-03-23 NOTE — Progress Notes (Signed)
Pt's right IJ introducer will not draw back blood, flushes fine, currently infusing medications. Unable to obtain morning Co-ox. Lab to come stick pt for morning labs. Will make day-shift RN aware and address with physician.

## 2015-03-23 NOTE — Progress Notes (Signed)
Patient ID: Miguel Vazquez, male   DOB: 1940-08-27, 74 y.o.   MRN: [id number] EVENING ROUNDS NOTE :     [address]       ,[place] [address]             [phone]                 5 Days Post-Op Procedure(s) (LRB): CORONARY ARTERY BYPASS GRAFTING (CABG) x  three, using left internal mammary artery and right leg greater saphenous vein harvested endoscopically (N/A) INTRAOPERATIVE TRANSESOPHAGEAL ECHOCARDIOGRAM (N/A)  Total Length of Stay:  LOS: 5 days  BP 111/67 mmHg  Pulse 81  Temp(Src) 98 F (36.7 C) (Oral)  Resp 23  Ht 5\' 8"  (1.727 m)  Wt 188 lb 4.4 oz (85.4 kg)  BMI 28.63 kg/m2  SpO2 95%  .Intake/Output      12/20 0701 - 12/21 0700 12/21 0701 - 12/22 0700   P.O. 360    I.V. (mL/kg) 1150.5 (13.5) 306.9 (3.6)   IV Piggyback     Total Intake(mL/kg) 1510.5 (17.7) 306.9 (3.6)   Urine (mL/kg/hr) 3800 (1.9) 670 (0.7)   Stool 0 (0)    Total Output 3800 670   Net -2289.5 -363.1        Stool Occurrence 1 x      . sodium chloride Stopped (03/21/15 0800)  . sodium chloride    . sodium chloride Stopped (03/21/15 0800)  . lactated ringers 20 mL/hr at 03/23/15 0900  . lactated ringers 20 mL/hr at 03/23/15 0900     Lab Results  Component Value Date   WBC 9.5 03/23/2015   HGB 9.4* 03/23/2015   HCT 27.1* 03/23/2015   PLT 206 03/23/2015   GLUCOSE 168* 03/23/2015   CHOL 165 05/21/2014   TRIG 173.0* 05/21/2014   HDL 38.90* 05/21/2014   LDLDIRECT 84.1 05/21/2013   LDLCALC 92 05/21/2014   ALT 30 03/20/2015   AST 100* 03/20/2015   NA 134* 03/23/2015   K 3.3* 03/23/2015   CL 97* 03/23/2015   CREATININE 1.63* 03/23/2015   BUN 40* 03/23/2015   CO2 26 03/23/2015   INR 1.63* 03/18/2015   HGBA1C 6.8* 12/08/2014   MICROALBUR 6.8* 11/21/2009   Off drips now, lasix drip stopped today Cr improving D/c foley mn tonight and see if can pass urine in am Poss step down tomorrow  Grace Isaac MD  Beeper [phone] Office [phone] 03/23/2015 6:46  PM

## 2015-03-23 NOTE — Progress Notes (Signed)
Hypoglycemic Event  CBG: 68  Treatment: 15 GM carbohydrate snack  Symptoms: None  Follow-up CBG: Time:0012  (pt took his time eating his snack, not symtomatic) CBG Result:86  Possible Reasons for Event: Inadequate meal intake  Comments/MD notified:n/a hypoglycemia protocol used    Miguel Vazquez

## 2015-03-24 LAB — GLUCOSE, CAPILLARY
GLUCOSE-CAPILLARY: 106 mg/dL — AB (ref 65–99)
GLUCOSE-CAPILLARY: 138 mg/dL — AB (ref 65–99)
Glucose-Capillary: 151 mg/dL — ABNORMAL HIGH (ref 65–99)
Glucose-Capillary: 122 mg/dL — ABNORMAL HIGH (ref 65–99)
Glucose-Capillary: 95 mg/dL (ref 65–99)

## 2015-03-24 LAB — BASIC METABOLIC PANEL
ANION GAP: 10 (ref 5–15)
BUN: 35 mg/dL — ABNORMAL HIGH (ref 6–20)
CALCIUM: 8 mg/dL — AB (ref 8.9–10.3)
CO2: 25 mmol/L (ref 22–32)
Chloride: 96 mmol/L — ABNORMAL LOW (ref 101–111)
Creatinine, Ser: 1.38 mg/dL — ABNORMAL HIGH (ref 0.61–1.24)
GFR, EST AFRICAN AMERICAN: 57 mL/min — AB (ref >60)
GFR, EST NON AFRICAN AMERICAN: 49 mL/min — AB (ref >60)
Glucose, Bld: 97 mg/dL (ref 65–99)
POTASSIUM: 3.7 mmol/L (ref 3.5–5.1)
SODIUM: 131 mmol/L — AB (ref 135–145)

## 2015-03-24 MED ORDER — MOVING RIGHT ALONG BOOK
Freq: Once | Status: AC
  Administered 2015-03-24: 17:00:00
  Filled 2015-03-24: qty 1

## 2015-03-24 MED ORDER — PANTOPRAZOLE SODIUM 40 MG PO TBEC
40.0000 mg | DELAYED_RELEASE_TABLET | Freq: Every day | ORAL | Status: DC
  Administered 2015-03-26: 40 mg via ORAL
  Filled 2015-03-24: qty 1

## 2015-03-24 MED ORDER — ASPIRIN EC 325 MG PO TBEC
325.0000 mg | DELAYED_RELEASE_TABLET | Freq: Every day | ORAL | Status: DC
  Administered 2015-03-25 – 2015-03-26 (×2): 325 mg via ORAL
  Filled 2015-03-24 (×2): qty 1

## 2015-03-24 MED ORDER — ONDANSETRON HCL 4 MG PO TABS: 4.0000 mg | ORAL_TABLET | Freq: Four times a day (QID) | ORAL | Status: DC | PRN

## 2015-03-24 MED ORDER — SODIUM CHLORIDE 0.9 % IV SOLN: 250.0000 mL | INTRAVENOUS | Status: DC | PRN

## 2015-03-24 MED ORDER — POTASSIUM CHLORIDE CRYS ER 20 MEQ PO TBCR
20.0000 meq | EXTENDED_RELEASE_TABLET | ORAL | Status: DC | PRN
  Administered 2015-03-24: 20 meq via ORAL
  Filled 2015-03-24: qty 1

## 2015-03-24 MED ORDER — DOCUSATE SODIUM 100 MG PO CAPS
200.0000 mg | ORAL_CAPSULE | Freq: Every day | ORAL | Status: DC
  Administered 2015-03-25: 200 mg via ORAL
  Filled 2015-03-24 (×2): qty 2

## 2015-03-24 MED ORDER — SODIUM CHLORIDE 0.9 % IJ SOLN
3.0000 mL | Freq: Two times a day (BID) | INTRAMUSCULAR | Status: DC
  Administered 2015-03-24 – 2015-03-25 (×3): 3 mL via INTRAVENOUS

## 2015-03-24 MED ORDER — ONDANSETRON HCL 4 MG/2ML IJ SOLN: 4.0000 mg | Freq: Four times a day (QID) | INTRAMUSCULAR | Status: DC | PRN

## 2015-03-24 MED ORDER — SODIUM CHLORIDE 0.9 % IJ SOLN: 3.0000 mL | INTRAMUSCULAR | Status: DC | PRN

## 2015-03-24 MED ORDER — CARVEDILOL 3.125 MG PO TABS
3.1250 mg | ORAL_TABLET | Freq: Two times a day (BID) | ORAL | Status: DC
  Administered 2015-03-25 – 2015-03-26 (×3): 3.125 mg via ORAL
  Filled 2015-03-24 (×3): qty 1

## 2015-03-24 MED ORDER — INSULIN ASPART 100 UNIT/ML ~~LOC~~ SOLN
0.0000 [IU] | Freq: Three times a day (TID) | SUBCUTANEOUS | Status: DC
  Administered 2015-03-24: 2 [IU] via SUBCUTANEOUS
  Administered 2015-03-25: 4 [IU] via SUBCUTANEOUS
  Administered 2015-03-25: 2 [IU] via SUBCUTANEOUS

## 2015-03-24 MED ORDER — TRAMADOL HCL 50 MG PO TABS
50.0000 mg | ORAL_TABLET | ORAL | Status: DC | PRN
  Administered 2015-03-26: 100 mg via ORAL
  Filled 2015-03-24: qty 2

## 2015-03-24 MED ORDER — ACETAMINOPHEN 325 MG PO TABS
650.0000 mg | ORAL_TABLET | Freq: Four times a day (QID) | ORAL | Status: DC | PRN
  Administered 2015-03-25: 650 mg via ORAL
  Filled 2015-03-24: qty 2

## 2015-03-24 MED ORDER — POTASSIUM CHLORIDE CRYS ER 20 MEQ PO TBCR
20.0000 meq | EXTENDED_RELEASE_TABLET | Freq: Two times a day (BID) | ORAL | Status: DC
  Administered 2015-03-24 – 2015-03-26 (×5): 20 meq via ORAL
  Filled 2015-03-24 (×5): qty 1

## 2015-03-24 NOTE — Progress Notes (Addendum)
TCTS DAILY ICU PROGRESS NOTE                   [place]            [place], [address]          [phone]   6 Days Post-Op Procedure(s) (LRB): CORONARY ARTERY BYPASS GRAFTING (CABG) x  three, using left internal mammary artery and right leg greater saphenous vein harvested endoscopically (N/A) INTRAOPERATIVE TRANSESOPHAGEAL ECHOCARDIOGRAM (N/A)  Total Length of Stay:  LOS: 6 days   Subjective:  Mr. Laverty has no new complaints.  He has some mild confusion this morning.  +ambulation + BM  Objective: Vital signs in last 24 hours: Temp:  [97.3 F (36.3 C)-98.8 F (37.1 C)] 97.8 F (36.6 C) (12/22 0745) Pulse Rate:  [79-123] 99 (12/22 0745) Cardiac Rhythm:  [-] Normal sinus rhythm (12/22 0745) Resp:  [16-33] 27 (12/22 0745) BP: (82-117)/(48-86) 113/61 mmHg (12/22 0745) SpO2:  [89 %-100 %] 100 % (12/22 0745) Weight:  [187 lb 6.3 oz (85 kg)] 187 lb 6.3 oz (85 kg) (12/22 0500)  Filed Weights   03/22/15 0500 03/23/15 0500 03/24/15 0500  Weight: 185 lb 3 oz (84 kg) 188 lb 4.4 oz (85.4 kg) 187 lb 6.3 oz (85 kg)    Weight change: -14.1 oz (-0.4 kg)   Intake/Output from previous day: 12/21 0701 - 12/22 0700 In: 746.9 [P.O.:440; I.V.:306.9] Out: 1920 [Urine:1920]  Current Meds: Scheduled Meds: . amiodarone  400 mg Oral BID  . antiseptic oral rinse  7 mL Mouth Rinse BID  . aspirin EC  325 mg Oral Daily   Or  . aspirin  324 mg Per Tube Daily  . atorvastatin  40 mg Oral q1800  . bisacodyl  10 mg Oral Daily   Or  . bisacodyl  10 mg Rectal Daily  . docusate sodium  200 mg Oral Daily  . enoxaparin (LOVENOX) injection  40 mg Subcutaneous Q24H  . feeding supplement (GLUCERNA SHAKE)  237 mL Oral BID BM  . furosemide  40 mg Oral Daily  . insulin aspart  0-15 Units Subcutaneous 6 times per day  . insulin detemir  15 Units Subcutaneous Daily  . pantoprazole  40 mg Oral Daily  . saccharomyces boulardii  250 mg Oral BID  . sodium chloride  3 mL Intravenous Q12H   . tamsulosin  0.4 mg Oral Daily   Continuous Infusions: . sodium chloride Stopped (03/21/15 0800)  . sodium chloride    . sodium chloride Stopped (03/21/15 0800)  . lactated ringers 20 mL/hr at 03/23/15 0900  . lactated ringers 20 mL/hr at 03/23/15 0900   PRN Meds:.sodium chloride, lactated ringers, ondansetron (ZOFRAN) IV, oxyCODONE, potassium chloride, sodium chloride, traMADol  General appearance: alert, cooperative and no distress Heart: regular rate and rhythm Lungs: clear to auscultation bilaterally Abdomen: soft, non-tender; bowel sounds normal; no masses,  no organomegaly Extremities: edema trace Wound: clean and dry  Lab Results: CBC: Recent Labs  03/22/15 0610 03/23/15 0550  WBC 10.5 9.5  HGB 9.3* 9.4*  HCT 27.4* 27.1*  PLT 177 206   BMET:  Recent Labs  03/23/15 0550 03/24/15 0246  NA 134* 131*  K 3.3* 3.7  CL 97* 96*  CO2 26 25  GLUCOSE 168* 97  BUN 40* 35*  CREATININE 1.63* 1.38*  CALCIUM 8.3* 8.0*    PT/INR: No results for input(s): LABPROT, INR in the last 72 hours. Radiology: No results found.   Assessment/Plan:  S/P Procedure(s) (LRB): CORONARY ARTERY BYPASS GRAFTING (CABG) x  three, using left internal mammary artery and right leg greater saphenous vein harvested endoscopically (N/A) INTRAOPERATIVE TRANSESOPHAGEAL ECHOCARDIOGRAM (N/A)  1. CV- NSR, off all drips, continue Amiodarone, can maybe restart beta blocker tomorrow 2. Pulm- no acute issues, off oxygen, continue IS 3. Renal- creatinine trending down, remains hypervolemic, continue Lasix 4. Expected post operative blood loss anemia- Hgb stable 9.4 5. DM-cbgs controlled, continue insulin 6. Dispo- patient stable, will transfer patient to stepdown   Ellwood Handler 03/24/2015 8:34 AM   Chart reviewed, patient examined, agree with above. He is doing well overall. I would start low dose Coreg tomorrow if BP ok since his preop EF was low. Hold off on ACE I with renal function and this  can be started as an outpatient if renal function remains stable.  Continue daily lasix to get back to baseline weight which he says is about 178 lbs.   If creat continues to drop he can restart Metformin at discharge.  He is ambulatine, off oxygen and has had BM so he could potentially go home tomorrow or Saturday if he continues to progress.

## 2015-03-24 NOTE — Progress Notes (Signed)
Physical Therapy Treatment Patient Details Name: Miguel Vazquez MRN: [id number] DOB: [birth year] Today's Date: 03/24/2015    History of Present Illness pt is a 74 y/o male with h/o HTN and DM, admitted with 24 hour h/o severe indigestion that turned out on ECG to be consistent with ineroposterior MI.  Emergently to CATH and on 12/17 underwent CABG x3.    PT Comments    Progressing well, but still unsteady enough with AD to warrant therapy for home to help recondition pt.  Follow Up Recommendations  Home health PT;Supervision for mobility/OOB (pt needs some reconditioning to improve stability)     Equipment Recommendations  None recommended by PT    Recommendations for Other Services       Precautions / Restrictions Precautions Precautions: Sternal    Mobility  Bed Mobility                  Transfers Overall transfer level: Needs assistance Equipment used: Rolling walker (2 wheeled) Transfers: Sit to/from Stand Sit to Stand: Min assist         General transfer comment: reinforced sternal precautions and assist to come forward.  Ambulation/Gait Ambulation/Gait assistance: Min guard Ambulation Distance (Feet): 380 Feet Assistive device: Rolling walker (2 wheeled) Gait Pattern/deviations: Step-through pattern Gait velocity: able to increase speen mildly with cuing Gait velocity interpretation: Below normal speed for age/gender General Gait Details: continued "waggle" and wandering with the RW.  Pt not steady enough to walk away from him.  Mildly unsteady without AD.   Stairs Stairs: Yes Stairs assistance: Supervision Stair Management: One rail Right;Alternating pattern;Step to pattern;Forwards Number of Stairs: 12 General stair comments: safe if holding a rail  Wheelchair Mobility    Modified Rankin (Stroke Patients Only)       Balance Overall balance assessment: Needs assistance Sitting-balance support: No upper extremity supported Sitting  balance-Leahy Scale: Good     Standing balance support: No upper extremity supported Standing balance-Leahy Scale: Fair Standing balance comment: statically okay, but mildly unsteady dynamically                    Cognition Arousal/Alertness: Awake/alert Behavior During Therapy: WFL for tasks assessed/performed Overall Cognitive Status: Within Functional Limits for tasks assessed                      Exercises      General Comments General comments (skin integrity, edema, etc.): unable to get a pulse ox reading on his fingers, but pt had good response to mobility      Pertinent Vitals/Pain Pain Assessment: Faces Faces Pain Scale: Hurts a little bit Pain Location: chest  Pain Descriptors / Indicators:  (pinching) Pain Intervention(s): Monitored during session    Home Living                      Prior Function            PT Goals (current goals can now be found in the care plan section) Acute Rehab PT Goals Patient Stated Goal: get home PT Goal Formulation: With patient Time For Goal Achievement: 04/05/15 Potential to Achieve Goals: Good Progress towards PT goals: Progressing toward goals    Frequency  Min 3X/week    PT Plan Current plan remains appropriate    Co-evaluation             End of Session   Activity Tolerance: Patient tolerated treatment well Patient left: with call bell/phone  within reach;with family/visitor present     Time: [id number] PT Time Calculation (min) (ACUTE ONLY): 22 min  Charges:  $Gait Training: 8-22 mins                    G Codes:      [place], Tessie Fass 03/24/2015, 2:58 PM 03/24/2015  Donnella Sham, [place] [phone]  (pager)

## 2015-03-24 NOTE — Discharge Instructions (Signed)
Coronary Artery Bypass Grafting, Care After Refer to this sheet in the next few weeks. These instructions provide you with information on caring for yourself after your procedure. Your health care provider may also give you more specific instructions. Your treatment has been planned according to current medical practices, but problems sometimes occur. Call your health care provider if you have any problems or questions after your procedure. WHAT TO EXPECT AFTER THE PROCEDURE Recovery from surgery will be different for everyone. Some people feel well after 3 or 4 weeks, while for others it takes longer. After your procedure, it is typical to have the following:  Nausea and a lack of appetite.   Constipation.  Weakness and fatigue.   Depression or irritability.   Pain or discomfort at your incision site. HOME CARE INSTRUCTIONS  Take medicines only as directed by your health care provider. Do not stop taking medicines or start any new medicines without first checking with your health care provider.  Take your pulse as directed by your health care provider.  Perform deep breathing as directed by your health care provider. If you were given a device called an incentive spirometer, use it to practice deep breathing several times a day. Support your chest with a pillow or your arms when you take deep breaths or cough.  Keep incision areas clean, dry, and protected. Remove or change any bandages (dressings) only as directed by your health care provider. You may have skin adhesive strips over the incision areas. Do not take the strips off. They will fall off on their own.  Check incision areas daily for any swelling, redness, or drainage.  If incisions were made in your legs, do the following:  Avoid crossing your legs.   Avoid sitting for long periods of time. Change positions every 30 minutes.   Elevate your legs when you are sitting.  Wear compression stockings as directed by your  health care provider. These stockings help keep blood clots from forming in your legs.  Take showers once your health care provider approves. Until then, only take sponge baths. Pat incisions dry. Do not rub incisions with a washcloth or towel. Do not take baths, swim, or use a hot tub until your health care provider approves.  Eat foods that are high in fiber, such as raw fruits and vegetables, whole grains, beans, and nuts. Meats should be lean cut. Avoid canned, processed, and fried foods.  Drink enough fluid to keep your urine clear or pale yellow.  Weigh yourself every day. This helps identify if you are retaining fluid that may make your heart and lungs work harder.  Rest and limit activity as directed by your health care provider. You may be instructed to:  Stop any activity at once if you have chest pain, shortness of breath, irregular heartbeats, or dizziness. Get help right away if you have any of these symptoms.  Move around frequently for short periods or take short walks as directed by your health care provider. Increase your activities gradually. You may need physical therapy or cardiac rehabilitation to help strengthen your muscles and build your endurance.  Avoid lifting, pushing, or pulling anything heavier than 10 lb (4.5 kg) for at least 6 weeks after surgery.  Do not drive until your health care provider approves.  Ask your health care provider when you may return to work.  Ask your health care provider when you may resume sexual activity.  Keep all follow-up visits as directed by your health care  provider. This is important. °SEEK MEDICAL CARE IF: °· You have swelling, redness, increasing pain, or drainage at the site of an incision. °· You have a fever. °· You have swelling in your ankles or legs. °· You have pain in your legs.   °· You gain 2 or more pounds (0.9 kg) a day. °· You are nauseous or vomit. °· You have diarrhea.  °SEEK IMMEDIATE MEDICAL CARE IF: °· You have  chest pain that goes to your jaw or arms. °· You have shortness of breath.   °· You have a fast or irregular heartbeat.   °· You notice a "clicking" in your breastbone (sternum) when you move.   °· You have numbness or weakness in your arms or legs. °· You feel dizzy or light-headed.   °MAKE SURE YOU: °· Understand these instructions. °· Will watch your condition. °· Will get help right away if you are not doing well or get worse. °  °This information is not intended to replace advice given to you by your health care provider. Make sure you discuss any questions you have with your health care provider. °  °Document Released: 10/06/2004 Document Revised: 04/09/2014 Document Reviewed: 08/26/2012 °Elsevier Interactive Patient Education ©2016 [employer] ° °Endoscopic Saphenous Vein Harvesting, Care After °Refer to this sheet in the next few weeks. These instructions provide you with information on caring for yourself after your procedure. Your health care provider may also give you more specific instructions. Your treatment has been planned according to current medical practices, but problems sometimes occur. Call your health care provider if you have any problems or questions after your procedure. °HOME CARE INSTRUCTIONS °Medicine °· Take whatever pain medicine your surgeon prescribes. Follow the directions carefully. Do not take over-the-counter pain medicine unless your surgeon says it is okay. Some pain medicine can cause bleeding problems for several weeks after surgery. °· Follow your surgeon's instructions about driving. You will probably not be permitted to drive after heart surgery. °· Take any medicines your surgeon prescribes. Any medicines you took before your heart surgery should be checked with your health care provider before you start taking them again. °Wound care °· If your surgeon has prescribed an elastic bandage or stocking, ask how long you should wear it. °· Check the area around your surgical  cuts (incisions) whenever your bandages (dressings) are changed. Look for any redness or swelling. °· You will need to return to have the stitches (sutures) or staples taken out. Ask your surgeon when to do that. °· Ask your surgeon when you can shower or bathe. °Activity °· Try to keep your legs raised when you are sitting. °· Do any exercises your health care providers have given you. These may include deep breathing exercises, coughing, walking, or other exercises. °SEEK MEDICAL CARE IF: °· You have any questions about your medicines. °· You have more leg pain, especially if your pain medicine stops working. °· New or growing bruises develop on your leg. °· Your leg swells, feels tight, or becomes red. °· You have numbness in your leg. °SEEK IMMEDIATE MEDICAL CARE IF: °· Your pain gets much worse. °· Blood or fluid leaks from any of the incisions. °· Your incisions become warm, swollen, or red. °· You have chest pain. °· You have trouble breathing. °· You have a fever. °· You have more pain near your leg incision. °MAKE SURE YOU: °· Understand these instructions. °· Will watch your condition. °· Will get help right away if you are not doing well or   get worse.   This information is not intended to replace advice given to you by your health care provider. Make sure you discuss any questions you have with your health care provider.   Document Released: 11/29/2010 Document Revised: 04/09/2014 Document Reviewed: 11/29/2010 Elsevier Interactive Patient Education 2016 [place]: POST OP INSTRUCTIONS  1. DIET: Follow a light bland diet the first 24 hours after arrival home, such as soup, liquids, crackers, etc.  Be sure to include lots of fluids daily.  Avoid fast food or heavy meals as your are more likely to get nauseated.  Eat a low fat the next few days after surgery. 2. Take your usually prescribed home medications unless otherwise directed. 3. PAIN CONTROL: a. Pain is best controlled  by a usual combination of three different methods TOGETHER: i. Ice/Heat ii. Over the counter pain medication iii. Prescription pain medication b. Most patients will experience some swelling and bruising around the hernia(s) such as the bellybutton, groins, or old incisions.  Ice packs or heating pads (30-60 minutes up to 6 times a day) will help. Use ice for the first few days to help decrease swelling and bruising, then switch to heat to help relax tight/sore spots and speed recovery.  Some people prefer to use ice alone, heat alone, alternating between ice & heat.  Experiment to what works for you.  Swelling and bruising can take several weeks to resolve.   i. It is helpful to take an over-the-counter pain medication regularly for the first few weeks.  Choose Acetaminophen (Tylenol, etc) 325-650mg  four times a day (every meal & bedtime) c. A  prescription for pain medication should be given to you upon discharge.  Take your pain medication as prescribed.  i. If you are having problems/concerns with the prescription medicine (does not control pain, nausea, vomiting, rash, itching, etc), please call us [phone] to see if we need to switch you to a different pain medicine that will work better for you and/or control your side effect better. ii. If you need a refill on your pain medication, please contact your pharmacy.  They will contact our office to request authorization. Prescriptions will not be filled after 5 pm or on week-ends. 4. Avoid getting constipated.  Between the surgery and the pain medications, it is common to experience some constipation.  Increasing fluid intake and taking a fiber supplement (such as Metamucil, Citrucel, FiberCon, MiraLax, etc) 1-2 times a day regularly will usually help prevent this problem from occurring.  A mild laxative (prune juice, Milk of Magnesia, MiraLax, etc) should be taken according to package directions if there are no bowel movements after 48 hours.    5. Wash / shower every day.  You may shower over the dressings as they are waterproof.   6. Remove your waterproof bandages 5 days after surgery.  You may leave the incision open to air.  You may replace a dressing/Band-Aid to cover the incision for comfort if you wish.  Continue to shower over incision(s) after the dressing is off.    7. ACTIVITIES as tolerated:   a. You may resume regular (light) daily activities beginning the next day--such as daily self-care, walking, climbing stairs--gradually increasing activities as tolerated.  If you can walk 30 minutes without difficulty, it is safe to try more intense activity such as jogging, treadmill, bicycling, low-impact aerobics, swimming, etc. b. Save the most intensive and strenuous activity for last such as sit-ups, heavy lifting, contact sports, etc  Refrain from any heavy lifting or straining until you are off narcotics for pain control.   c. DO NOT PUSH THROUGH PAIN.  Let pain be your guide: If it hurts to do something, don't do it.  Pain is your body warning you to avoid that activity for another week until the pain goes down. d. You may drive when you are no longer taking prescription pain medication, you can comfortably wear a seatbelt, and you can safely maneuver your car and apply brakes. e. Dennis Bast may have sexual intercourse when it is comfortable.  8. FOLLOW UP in our office a. Please call [facility] at [phone] to set up an appointment to see your surgeon in the office for a follow-up appointment approximately 2-3 weeks after your surgery. b. Make sure that you call for this appointment the day you arrive home to insure a convenient appointment time. 9.  IF YOU HAVE DISABILITY OR FAMILY LEAVE FORMS, BRING THEM TO THE OFFICE FOR PROCESSING.  DO NOT GIVE THEM TO YOUR DOCTOR.  WHEN TO CALL us [phone]: 1. Poor pain control 2. Reactions / problems with new medications (rash/itching, nausea, etc)  3. Fever over 101.5 F (38.5  C) 4. Inability to urinate 5. Nausea and/or vomiting 6. Worsening swelling or bruising 7. Continued bleeding from incision. 8. Increased pain, redness, or drainage from the incision   The clinic staff is available to answer your questions during regular business hours (8:30am-5pm).  Please dont hesitate to call and ask to speak to one of our nurses for clinical concerns.   If you have a medical emergency, go to the nearest emergency room or call 911.  A surgeon from [facility] is always on call at the hospitals in [facility], [place], [place], [place], [place]  [address] ?  [address] [address], [place], [place]   [address] MAIN: [phone] ? TOLL FREE: [phone] ? FAX: [phone] [email or web address]

## 2015-03-24 NOTE — Discharge Summary (Signed)
Physician Discharge Summary  Patient ID: Miguel Vazquez MRN: [id number] DOB/AGE: 73/15/1942 73 y.o.  Admit date: 03/18/2015 Discharge date: 03/26/2015   Admission Diagnoses: ST elevation MI  Patient Active Problem List   Diagnosis Date Noted  . Scrotal inguinal hernia s/p lap repair w mesh 03/15/2015 03/21/2015  . 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 Personal history of colonic polyps - adenomas 09/09/2013  . Obesity (BMI 30-39.9) 11/18/2012  . History of AAA (abdominal aortic aneurysm) repair 11/18/2012  . BACK PAIN, UPPER 08/10/2009  . Type 2 diabetes mellitus, controlled ([place]) 10/15/2008  . Hyperlipidemia 10/15/2008  . Essential hypertension 10/15/2008    Discharge Diagnoses:   ST elevation MI  Ischemic cardiomyopathy  Left main and 3 vessel CAD  Patient Active Problem List   Diagnosis Date Noted  . Scrotal inguinal hernia s/p lap repair w mesh 03/15/2015 03/21/2015  . 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 S/P CABG x 3 03/18/2015  . Personal history of colonic polyps - adenomas 09/09/2013  . Obesity (BMI 30-39.9) 11/18/2012  . History of AAA (abdominal aortic aneurysm) repair 11/18/2012  . BACK PAIN, UPPER 08/10/2009  . Type 2 diabetes mellitus, controlled ([place]) 10/15/2008  . Hyperlipidemia 10/15/2008  . Essential hypertension 10/15/2008   Discharged Condition: good   History of Present Illness:  Miguel Vazquez is a74 yo obese white male with known history of Hypertension, Hyperlipidemia, DM, and Peripheral Arterial Disease. He underwent hernia repair on 03/15/2015.  Since then he developed acute onset of severe indigestion, which the wife states has been occuring recently.  However,on day of presentation is was severe and did not go away.  He presented the [facility] ED and was ruled in for a STEMI.  He was taken emergently to the cardiac catheterization lab  and was found to have severe 3 vessel CAD.  His arteries were heavily calcified and not felt to be amenable to PCI.  Therefore, emergent CABG consult was requested.  He was evaluated by Dr. Roxan Hockey at which time the patient admitted to chest pain during his catheterization which had improved some.   However, Dr. Roxan Hockey was in agreement the patient would benefit from emergency CABG procedure. The risks and benefits of the procedure were explained to the patient and he was agreeable to proceed.   Hospital Course:   He was taken emergently to the operating room. He underwent CABG x 3 utilizing LIMA to LAD, SVG to PLB, and SVG to OM2.  He also underwent endoscopic harvest of the greater saphenous vein from his right thigh to below the knee.  He tolerated the procedure, but required inotropic support.  He was taken to the SICU in critical but stable condition. During his stay in the SICU he was weaned of Milrinone, Epinephrine, NorEpinehprine and Vasopressin as tolerated.  He was aggressively diuresed for hypervolemia.  He was weaned and extubated on POD #2.  The patient developed some confusion and restlessness and removed his PA catheter.  He developed Atrial Fibrillation and was subsequently treated with IV Amiodarone with conversion to NSR.  His arterial line and chest tubes were removed without difficulty.  He had some mild ARF with creatinine peaking at 2.03, which has since trended down.  He is ambulating and felt medically stable for transfer to the step down unit in stable condition.    The patient has continued to progress.  He is maintaining NSR with occasional PVCs/bigeminy,  but this has been stable and his pacing wires have been removed without difficulty.  He has been started on low dose Coreg.  His creatinine is down to 1.2.  He is diabetic and will be restarted on his Metformin at discharge.  He is tolerating a carb modified heart healthy diet. He is ambulating independently.  He is  medically stable and felt safe for discharge home on 03/26/2015.       Significant Diagnostic Studies: angiography:    Ost RCA to Dist RCA lesion, 100% stenosed.  Ost LM lesion, 90% stenosed.  Ost LM to LM lesion, 70% stenosed.  1st Mrg lesion, 75% stenosed.  Ost Cx to Prox Cx lesion, 90% stenosed.  Mid LAD lesion, 95% stenosed.  There is severe left ventricular systolic dysfunction.  1. Critical left main and 3 vessel obstructive CAD. Severely calcified coronary arteries.  2. Severe LV dysfunction.   Treatments: surgery:   Emergency median sternotomy, extracorporeal circulation,coronary artery bypass grafting x3 (left internal mammary artery to left anterior descending, saphenous vein graft to obtuse marginal 2, and saphenous vein graft to posterior lateral); endoscopic vein harvest, right thigh.   Disposition: Home   Discharge Medications:   Medication List    STOP taking these medications        ibuprofen 200 MG tablet  Commonly known as:  ADVIL,MOTRIN     lisinopril-hydrochlorothiazide 20-12.5 MG tablet  Commonly known as:  PRINZIDE,ZESTORETIC      TAKE these medications        amiodarone 400 MG tablet  Commonly known as:  PACERONE  Take 1 tablet (400 mg total) by mouth 2 (two) times daily. X 1 week, then decrease to 200 mg po bid     aspirin 325 MG EC tablet  Take 1 tablet (325 mg total) by mouth daily.     atorvastatin 40 MG tablet  Commonly known as:  LIPITOR  TAKE 1 TABLET (40 MG TOTAL) BY MOUTH DAILY.     carvedilol 3.125 MG tablet  Commonly known as:  COREG  Take 1 tablet (3.125 mg total) by mouth 2 (two) times daily with a meal.     furosemide 40 MG tablet  Commonly known as:  LASIX  Take 1 tablet (40 mg total) by mouth daily. X 1 week     KRILL OIL PO  Take 1 capsule by mouth daily.     metFORMIN 500 MG tablet  Commonly known as:  GLUCOPHAGE  TAKE 2 TABLETS BY MOUTH TWICE A DAY     multivitamin tablet  Take 1 tablet by mouth  daily. Centrium Silver once daily     omeprazole 40 MG capsule  Commonly known as:  PRILOSEC  Take 1 capsule (40 mg total) by mouth daily before breakfast.     ondansetron 4 MG disintegrating tablet  Commonly known as:  ZOFRAN-ODT  Take 4 mg by mouth every 4 (four) hours as needed for nausea.     oxyCODONE 5 MG immediate release tablet  Commonly known as:  Oxy IR/ROXICODONE  Take 1-2 tablets (5-10 mg total) by mouth every 4 (four) hours as needed for severe pain.     potassium chloride SA 20 MEQ tablet  Commonly known as:  K-DUR,KLOR-CON  Take 1 tablet (20 mEq total) by mouth 2 (two) times daily. X 1 week     ranitidine 300 MG tablet  Commonly known as:  ZANTAC  Take 1 tablet (300 mg total) by mouth at bedtime.  tamsulosin 0.4 MG Caps capsule  Commonly known as:  FLOMAX  Take 0.4 mg by mouth daily. For 10 days 12-13 to 12-23     vitamin E 400 UNIT capsule  Take 400 Units by mouth daily.        The patient has been discharged on:   1.Beta Blocker:  Yes [   ]                              No   [   ]                              If No, reason:  2.Ace Inhibitor/ARB: Yes [   ]                                     No  [ x   ]                                     If No, reason: ARF  3.Statin:   Yes [x   ]                  No  [   ]                  If No, reason:  4.Ecasa:  Yes  [  x ]                  No   [   ]                  If No, reason:     Follow Up: Follow-up Information    Follow up with Melrose Nakayama, MD On 04/25/2014.   Specialty:  Cardiothoracic Surgery   Why:  Appointment is 12:00   Contact information:   [address] [place] [address] [address] [phone]       Follow up with [facility] On 04/26/2015.   Why:  Please get CXR at 11:30   Contact information:   [facility]       Follow up with Adin Hector., MD. Schedule an appointment as soon as possible for a visit in 2 weeks.   Specialty:  General Surgery    Why:  To follow up after your hernia operation, To follow up after your hospital stay   Contact information:   [place] [address] [phone]       Follow up with Erlene Quan, PA-C On 04/05/2014.   Specialties:  Cardiology, Radiology   Why:  Appointment is at [facility] information:   [address] [place] [address] [phone]       Signed: Burke Keels 03/26/2015, 8:20 AM

## 2015-03-24 NOTE — Care Management Note (Signed)
Case Management Note  Patient Details  Name: Miguel Vazquez MRN: [id number] Date of Birth: 11-Jan-1941  Subjective/Objective:   Pt is s/p CABG                 Action/Plan:  Pt is from independent from home with wife.  Wife will provide 24 hour supervision post discharge.  Pt transferred from [facility] 03/24/2015 CM will continue to assess for CM needs.   Expected Discharge Date:  03/26/15               Expected Discharge Plan:  Home/Self Care  In-House Referral:     Discharge planning Services  CM Consult  Post Acute Care Choice:    Choice offered to:     DME Arranged:    DME Agency:     HH Arranged:    HH Agency:     Status of Service:  In process, will continue to follow  Medicare Important Message Given:  Yes Date Medicare IM Given:    Medicare IM give by:    Date Additional Medicare IM Given:    Additional Medicare Important Message give by:     If discussed at [place] of Stay Meetings, dates discussed:    Additional Comments: CM assessed pt with wife at bedside.  PT has placed HH recommendations - CM left sticky note for MD to write HH/DME orders prior to discharge so services can be arranged.   Pt is interested in [place] if [facility] is ordered Maryclare Labrador, RN 03/24/2015, 2:55 PM

## 2015-03-25 LAB — BASIC METABOLIC PANEL
Anion gap: 9 (ref 5–15)
BUN: 26 mg/dL — ABNORMAL HIGH (ref 6–20)
CO2: 29 mmol/L (ref 22–32)
Calcium: 8.8 mg/dL — ABNORMAL LOW (ref 8.9–10.3)
Chloride: 99 mmol/L — ABNORMAL LOW (ref 101–111)
Creatinine, Ser: 1.27 mg/dL — ABNORMAL HIGH (ref 0.61–1.24)
GFR calc Af Amer: >60 mL/min (ref >60)
GFR calc non Af Amer: 54 mL/min — ABNORMAL LOW (ref >60)
Glucose, Bld: 107 mg/dL — ABNORMAL HIGH (ref 65–99)
Potassium: 4 mmol/L (ref 3.5–5.1)
Sodium: 137 mmol/L (ref 135–145)

## 2015-03-25 LAB — GLUCOSE, CAPILLARY
GLUCOSE-CAPILLARY: 157 mg/dL — AB (ref 65–99)
Glucose-Capillary: 135 mg/dL — ABNORMAL HIGH (ref 65–99)
Glucose-Capillary: 178 mg/dL — ABNORMAL HIGH (ref 65–99)
Glucose-Capillary: 72 mg/dL (ref 65–99)

## 2015-03-25 NOTE — Care Management Important Message (Signed)
Important Message  Patient Details  Name: Miguel Vazquez MRN: [id number] Date of Birth: [birth year]   Medicare Important Message Given:  Yes    Nathen May 03/25/2015, 3:24 PM

## 2015-03-25 NOTE — Progress Notes (Signed)
CARDIAC REHAB PHASE I   PRE:  Rate/Rhythm: 91 SR  BP:  Supine:   Sitting: 115/74  Standing:    SaO2: 95%RA  MODE:  Ambulation: 550 ft   POST:  Rate/Rhythm: 96 SR  BP:  Supine: 123/83  Sitting:   Standing:    SaO2: 100%RA ear [id number] Pt assisted with sweat pants and gown as he was finishing up his bath. Pt walked 550 ft on RA with rolling walker and asst x 1. Pt gets a little close to things on left side. To bed for pacing wire removal. Will ed when wife arrives. Pt stated has access to walker at home.   Graylon Good, RN BSN  03/25/2015 9:39 AM

## 2015-03-25 NOTE — Progress Notes (Signed)
Patient's heart rhythm converted to Bigeminy in the 80's. Notified [place], [place] and received orders to continue to monitor. No further labs or tests ordered. Will continue to monitor closely.   Maia Petties, RN

## 2015-03-25 NOTE — Progress Notes (Addendum)
[address]       [place], [address]             [phone]      7 Days Post-Op Procedure(s) (LRB): CORONARY ARTERY BYPASS GRAFTING (CABG) x  three, using left internal mammary artery and right leg greater saphenous vein harvested endoscopically (N/A) INTRAOPERATIVE TRANSESOPHAGEAL ECHOCARDIOGRAM (N/A) Subjective: Feels pretty well overall  Objective: Vital signs in last 24 hours: Temp:  [97.4 F (36.3 C)-98.9 F (37.2 C)] 97.4 F (36.3 C) (12/23 0537) Pulse Rate:  [92-103] 100 (12/23 0537) Cardiac Rhythm:  [-] Normal sinus rhythm (12/22 2020) Resp:  [18-32] 18 (12/23 0537) BP: (91-136)/(60-84) 136/75 mmHg (12/23 0537) SpO2:  [88 %-100 %] 100 % (12/23 0537) Weight:  [184 lb 11.2 oz (83.779 kg)] 184 lb 11.2 oz (83.779 kg) (12/23 0537)  Hemodynamic parameters for last 24 hours:    Intake/Output from previous day: 12/22 0701 - 12/23 0700 In: 720 [P.O.:720] Out: 575 [Urine:575] Intake/Output this shift:    General appearance: alert, cooperative and no distress Heart: regular rate and rhythm and soft systolic murmur, occas extrasystole Lungs: dim in bases mildly , some basilar crackles Abdomen: benign Extremities: + pitting edema Wound: incis healing well  Lab Results:  Recent Labs  03/23/15 0550  WBC 9.5  HGB 9.4*  HCT 27.1*  PLT 206   BMET:  Recent Labs  03/24/15 0246 03/25/15 0352  NA 131* 137  K 3.7 4.0  CL 96* 99*  CO2 25 29  GLUCOSE 97 107*  BUN 35* 26*  CREATININE 1.38* 1.27*  CALCIUM 8.0* 8.8*    PT/INR: No results for input(s): LABPROT, INR in the last 72 hours. ABG    Component Value Date/Time   PHART 7.363 03/20/2015 1234   HCO3 17.0* 03/20/2015 1234   TCO2 18 03/20/2015 1234   ACIDBASEDEF 7.0* 03/20/2015 1234   O2SAT 52.3 03/22/2015 0415   CBG (last 3)   Recent Labs  03/24/15 1729 03/24/15 2126 03/25/15 0651  GLUCAP 138* 95 135*    Meds Scheduled Meds: . amiodarone  400 mg Oral BID  . aspirin EC   325 mg Oral Daily  . atorvastatin  40 mg Oral q1800  . carvedilol  3.125 mg Oral BID WC  . docusate sodium  200 mg Oral Daily  . enoxaparin (LOVENOX) injection  40 mg Subcutaneous Q24H  . feeding supplement (GLUCERNA SHAKE)  237 mL Oral BID BM  . furosemide  40 mg Oral Daily  . insulin aspart  0-24 Units Subcutaneous TID AC & HS  . pantoprazole  40 mg Oral QAC breakfast  . potassium chloride  20 mEq Oral BID  . saccharomyces boulardii  250 mg Oral BID  . sodium chloride  3 mL Intravenous Q12H  . tamsulosin  0.4 mg Oral Daily   Continuous Infusions:  PRN Meds:.sodium chloride, acetaminophen, ondansetron **OR** ondansetron (ZOFRAN) IV, sodium chloride, traMADol  Xrays No results found.  Assessment/Plan: S/P Procedure(s) (LRB): CORONARY ARTERY BYPASS GRAFTING (CABG) x  three, using left internal mammary artery and right leg greater saphenous vein harvested endoscopically (N/A) INTRAOPERATIVE TRANSESOPHAGEAL ECHOCARDIOGRAM (N/A)  1 conts with good overall progress 2 mildly tachy with PVC's, will restart coreg low dose 3 cont pulm toilet/rehab- walked once yesterday 4 cont diuresis for volume overload , creat is improved further- no ace inhib until outpatient 5 d/c wires this am 6 sugars well controlled, prob restart glucophage at d/c 7 poss home later today vs  tomorrow  LOS: 7 days    GOLD,WAYNE E 03/25/2015   Chart reviewed, patient examined, agree with above. He continues to improve. Tolerating Coreg so far. Creat coming down as is weight. Probably home tomorrow.

## 2015-03-25 NOTE — Progress Notes (Signed)
[id number] Education completed with pt and family who voiced understanding. Encouraged IS. Pt's wife stated that pt does not have walker at home that she is aware of, so pt will need rolling walker for home use. Reviewed carb counting and heart healthy diet choices. Discussed CRP 2 and will refer to [place]. Wrote down how to view discharge video and encouraged them to watch when pt on bedrest for pacing wire removal. Graylon Good RN BSN 03/25/2015 10:44 AM

## 2015-03-25 NOTE — Care Management Note (Signed)
Case Management Note  Patient Details  Name: CLEVE BEYERLE MRN: [id number] Date of Birth: 1940/08/08  Subjective/Objective:    Pt is s/p CABG plan for home with [facility] Services. CM did speak with pt and wife in room in regards to disposition needs.                 Action/Plan: Pt/ wife wanted to use [facility] and they are out of network. CM did make referral with [place]. Orders are needed for [facility] services. CM did call PA, however no return phone call back. SOC to begin within 24-48 hrs post d/c. Pt states he has a RW at home. If pt can not find he was instructed to call PCP for order. No further needs from CM at this time.    Expected Discharge Date:  03/26/15               Expected Discharge Plan:  [place]  In-House Referral:  NA  Discharge planning Services  CM Consult  Post Acute Care Choice:  Home Health Choice offered to:  Patient, Spouse  DME Arranged:  N/A DME Agency:  NA  HH Arranged:  PT HH Agency:  [place]  Status of Service:  Completed, signed off  Medicare Important Message Given:  Yes Date Medicare IM Given:    Medicare IM give by:    Date Additional Medicare IM Given:    Additional Medicare Important Message give by:     If discussed at [place] of Stay Meetings, dates discussed:    Additional Comments:  Bethena Roys, RN 03/25/2015, 3:44 PM

## 2015-03-26 LAB — GLUCOSE, CAPILLARY
GLUCOSE-CAPILLARY: 184 mg/dL — AB (ref 65–99)
GLUCOSE-CAPILLARY: 140 mg/dL — AB (ref 65–99)

## 2015-03-26 MED ORDER — AMIODARONE HCL 400 MG PO TABS: 400.0000 mg | ORAL_TABLET | Freq: Two times a day (BID) | ORAL | Status: DC

## 2015-03-26 MED ORDER — CARVEDILOL 3.125 MG PO TABS: 3.1250 mg | ORAL_TABLET | Freq: Two times a day (BID) | ORAL | Status: DC

## 2015-03-26 MED ORDER — POTASSIUM CHLORIDE CRYS ER 20 MEQ PO TBCR: 20.0000 meq | EXTENDED_RELEASE_TABLET | Freq: Two times a day (BID) | ORAL | Status: DC

## 2015-03-26 MED ORDER — OXYCODONE HCL 5 MG PO TABS: 5.0000 mg | ORAL_TABLET | ORAL | Status: DC | PRN

## 2015-03-26 MED ORDER — FUROSEMIDE 40 MG PO TABS: 40.0000 mg | ORAL_TABLET | Freq: Every day | ORAL | Status: DC

## 2015-03-26 MED ORDER — ASPIRIN 325 MG PO TBEC: 325.0000 mg | DELAYED_RELEASE_TABLET | Freq: Every day | ORAL | Status: DC

## 2015-03-26 NOTE — Progress Notes (Addendum)
       [address]       [place] [address]             [phone]          8 Days Post-Op Procedure(s) (LRB): CORONARY ARTERY BYPASS GRAFTING (CABG) x  three, using left internal mammary artery and right leg greater saphenous vein harvested endoscopically (N/A) INTRAOPERATIVE TRANSESOPHAGEAL ECHOCARDIOGRAM (N/A)  Subjective: Feels well today. Rested comfortably, appetite good.   Objective: Vital signs in last 24 hours: Patient Vitals for the past 24 hrs:  BP Temp Temp src Pulse Resp SpO2 Weight  03/26/15 0459 116/62 mmHg 98.3 F (36.8 C) Oral 80 18 97 % 179 lb 0.2 oz (81.2 kg)  03/25/15 2042 (!) 94/58 mmHg 98.8 F (37.1 C) Oral 84 18 100 % -  03/25/15 1340 110/66 mmHg 97.5 F (36.4 C) Oral 63 18 95 % -  03/25/15 0815 115/74 mmHg - - 98 - - -   Current Weight  03/26/15 179 lb 0.2 oz (81.2 kg)  03/25/2015      184 lb 11.2 oz (83.779 kg)    Intake/Output from previous day: 12/23 0701 - 12/24 0700 In: 597 [P.O.:597] Out: 350 [Urine:350]    PHYSICAL EXAM:  Heart: RRR, occ ectopy Lungs: Clear Wound: Clean and dry Extremities: +LE edema    Lab Results: CBC:No results for input(s): WBC, HGB, HCT, PLT in the last 72 hours. BMET:  Recent Labs  03/24/15 0246 03/25/15 0352  NA 131* 137  K 3.7 4.0  CL 96* 99*  CO2 25 29  GLUCOSE 97 107*  BUN 35* 26*  CREATININE 1.38* 1.27*  CALCIUM 8.0* 8.8*    PT/INR: No results for input(s): LABPROT, INR in the last 72 hours.    Assessment/Plan: S/P Procedure(s) (LRB): CORONARY ARTERY BYPASS GRAFTING (CABG) x  three, using left internal mammary artery and right leg greater saphenous vein harvested endoscopically (N/A) INTRAOPERATIVE TRANSESOPHAGEAL ECHOCARDIOGRAM (N/A)  CV- continues to have some bigeminy/trigeminy. BPs low normal but stable.  Will continue Coreg at present dose.  Vol overload- diuresing well. Continue Lasix.  AKI- Cr trending down.  Discussed with MD. Will plan d/c home  today- instructions reviewed with patient.   LOS: 8 days    COLLINS,GINA H 03/26/2015   Chart reviewed, patient examined, agree with above. He feels well. His rhythm is sinus with some periods of bigeminy and trigeminy. He is on Coreg 3.125 but I don't think his BP will allow higher dose right now. He still has some edema in ankles and feet and will go home on lasix. Discussed what to watch out for with patient and wife.

## 2015-03-26 NOTE — Progress Notes (Signed)
CT sutures removed per order and per protocol. Pt educated on not to remove steri- strips.

## 2015-03-26 NOTE — Progress Notes (Addendum)
CARDIAC REHAB PHASE I   PRE:  Rate/Rhythm: 83 SR  BP:   Sitting: 98/61     SaO2: 94%RA  MODE:  Ambulation: 680 ft   POST:  Rate/Rhythm: 88 SR BP:   Sitting: 128/64     SaO2: 94%RA  845-940  Pt ambulated 673ft with walker and 1 person assist. Pt walked a slow pace but was overall steady with gait. Pt is very pleasant and kind. Returned pt to recliner with LE elevated and VSS. Reviewed HH diet, diabetes nutrition, womb care, lifting/activity restrictions, and functional mobility (sit to stand) with pt and wife.    Rojelio Uhrich D Savina Olshefski,MS,ACSM-RCEP 03/26/2015 9:34 AM

## 2015-03-30 ENCOUNTER — Other Ambulatory Visit: Payer: Self-pay

## 2015-03-30 ENCOUNTER — Telehealth: Payer: Self-pay | Admitting: Family Medicine

## 2015-03-30 MED ORDER — GLUCOSE BLOOD VI STRP: ORAL_STRIP | Status: DC

## 2015-03-30 NOTE — Telephone Encounter (Signed)
Called and spoke with patient's wife. She verbalized that they have lost the glucose meter, as well as the strips. I notified patient that we had a meter available for pick up here at her convenience, and that test strips had been sent to pharmacy. Patient's wife states that she will pick up tomorrow.

## 2015-03-30 NOTE — Telephone Encounter (Signed)
Pt lost his glucometer and would like a new one. Pt can not remember the name of last meter we gave him. Pt wife would like to pick up sample meter today

## 2015-04-04 DIAGNOSIS — Z7984 Long term (current) use of oral hypoglycemic drugs: Secondary | ICD-10-CM | POA: Diagnosis not present

## 2015-04-04 DIAGNOSIS — E785 Hyperlipidemia, unspecified: Secondary | ICD-10-CM | POA: Diagnosis not present

## 2015-04-04 DIAGNOSIS — I10 Essential (primary) hypertension: Secondary | ICD-10-CM | POA: Diagnosis not present

## 2015-04-04 DIAGNOSIS — E119 Type 2 diabetes mellitus without complications: Secondary | ICD-10-CM | POA: Diagnosis not present

## 2015-04-04 DIAGNOSIS — E669 Obesity, unspecified: Secondary | ICD-10-CM | POA: Diagnosis not present

## 2015-04-04 DIAGNOSIS — Z95818 Presence of other cardiac implants and grafts: Secondary | ICD-10-CM | POA: Diagnosis not present

## 2015-04-04 DIAGNOSIS — Z6830 Body mass index (BMI) 30.0-30.9, adult: Secondary | ICD-10-CM | POA: Diagnosis not present

## 2015-04-04 DIAGNOSIS — Z48812 Encounter for surgical aftercare following surgery on the circulatory system: Secondary | ICD-10-CM | POA: Diagnosis not present

## 2015-04-04 DIAGNOSIS — Z7982 Long term (current) use of aspirin: Secondary | ICD-10-CM | POA: Diagnosis not present

## 2015-04-04 DIAGNOSIS — I2510 Atherosclerotic heart disease of native coronary artery without angina pectoris: Secondary | ICD-10-CM | POA: Diagnosis not present

## 2015-04-04 DIAGNOSIS — I2111 ST elevation (STEMI) myocardial infarction involving right coronary artery: Secondary | ICD-10-CM | POA: Diagnosis not present

## 2015-04-06 ENCOUNTER — Other Ambulatory Visit: Payer: Self-pay

## 2015-04-06 ENCOUNTER — Encounter: Payer: Self-pay | Admitting: Cardiology

## 2015-04-06 ENCOUNTER — Ambulatory Visit (INDEPENDENT_AMBULATORY_CARE_PROVIDER_SITE_OTHER): Payer: PPO | Admitting: Cardiology

## 2015-04-06 VITALS — BP 132/74 | HR 80 | Ht 68.0 in | Wt 181.0 lb

## 2015-04-06 DIAGNOSIS — N183 Chronic kidney disease, stage 3 unspecified: Secondary | ICD-10-CM | POA: Insufficient documentation

## 2015-04-06 DIAGNOSIS — E1122 Type 2 diabetes mellitus with diabetic chronic kidney disease: Secondary | ICD-10-CM | POA: Diagnosis not present

## 2015-04-06 DIAGNOSIS — I2589 Other forms of chronic ischemic heart disease: Secondary | ICD-10-CM | POA: Diagnosis not present

## 2015-04-06 DIAGNOSIS — E1129 Type 2 diabetes mellitus with other diabetic kidney complication: Secondary | ICD-10-CM | POA: Diagnosis not present

## 2015-04-06 DIAGNOSIS — I480 Paroxysmal atrial fibrillation: Secondary | ICD-10-CM

## 2015-04-06 DIAGNOSIS — Z951 Presence of aortocoronary bypass graft: Secondary | ICD-10-CM | POA: Diagnosis not present

## 2015-04-06 DIAGNOSIS — Z9889 Other specified postprocedural states: Secondary | ICD-10-CM | POA: Diagnosis not present

## 2015-04-06 DIAGNOSIS — I10 Essential (primary) hypertension: Secondary | ICD-10-CM

## 2015-04-06 DIAGNOSIS — I2111 ST elevation (STEMI) myocardial infarction involving right coronary artery: Secondary | ICD-10-CM

## 2015-04-06 DIAGNOSIS — I255 Ischemic cardiomyopathy: Secondary | ICD-10-CM

## 2015-04-06 DIAGNOSIS — I4891 Unspecified atrial fibrillation: Secondary | ICD-10-CM | POA: Diagnosis not present

## 2015-04-06 DIAGNOSIS — E785 Hyperlipidemia, unspecified: Secondary | ICD-10-CM | POA: Diagnosis not present

## 2015-04-06 MED ORDER — AMIODARONE HCL 200 MG PO TABS: 200.0000 mg | ORAL_TABLET | Freq: Every day | ORAL | Status: DC

## 2015-04-06 NOTE — Assessment & Plan Note (Signed)
Controlled.  

## 2015-04-06 NOTE — Assessment & Plan Note (Signed)
Inferior STEMI- cath- urgent CABG

## 2015-04-06 NOTE — Patient Instructions (Signed)
Schedule Echo  Stop Zantac  Decrease Amiodarone to 200 mg daily in 10 days  Lab work [facility] ) today  Appointment with Dr.Jordan in 3 months

## 2015-04-06 NOTE — Assessment & Plan Note (Signed)
PAF early post CABG-Amiodarone - NSR - sent home on Amiodaorne/ASA

## 2015-04-06 NOTE — Assessment & Plan Note (Signed)
Type 2 NIDDM with CRI-3 

## 2015-04-06 NOTE — Progress Notes (Signed)
04/06/2015  Miguel Vazquez   July 07, 1940  [id number]  Primary Physician Eulas Post, MD Primary Cardiologist: Dr Martinique  HPI:  75 y/o male with DM, HTN, CRI, and PVD s/p remote AAA R&G, admitted through the ED at [facility] 12/116/16 with a STEMI. At cath he had severe 3V CAD and he went for urgent CABG. He tolerated this well. Post op he had PAF and was placed on Amiodarone. He was in NSR at discharge, he was not sent home on anticoagulation. He is in the office today for f/u. He has had some trouble sleeping and some anorexia. He denies any unusual dyspnea or tachycardia.    Current Outpatient Prescriptions  Medication Sig Dispense Refill  . amiodarone (PACERONE) 400 MG tablet Take 1 tablet (400 mg total) by mouth 2 (two) times daily. X 1 week, then decrease to 200 mg po bid 60 tablet 2  . aspirin EC 325 MG EC tablet Take 1 tablet (325 mg total) by mouth daily. 30 tablet 0  . atorvastatin (LIPITOR) 40 MG tablet TAKE 1 TABLET (40 MG TOTAL) BY MOUTH DAILY. 90 tablet 1  . carvedilol (COREG) 3.125 MG tablet Take 1 tablet (3.125 mg total) by mouth 2 (two) times daily with a meal. 60 tablet 1  . glucose blood (BAYER CONTOUR NEXT TEST) test strip Test blood sugars as needed 100 each 4  . KRILL OIL PO Take 1 capsule by mouth daily.     . metFORMIN (GLUCOPHAGE) 500 MG tablet TAKE 2 TABLETS BY MOUTH TWICE A DAY 120 tablet 1  . Multiple Vitamin (MULTIVITAMIN) tablet Take 1 tablet by mouth daily. Centrium Silver once daily     . omeprazole (PRILOSEC) 40 MG capsule Take 1 capsule (40 mg total) by mouth daily before breakfast. 30 capsule 2  . ondansetron (ZOFRAN-ODT) 4 MG disintegrating tablet Take 4 mg by mouth every 4 (four) hours as needed for nausea.     Marland Kitchen oxyCODONE (OXY IR/ROXICODONE) 5 MG immediate release tablet Take 1-2 tablets (5-10 mg total) by mouth every 4 (four) hours as needed for severe pain. 30 tablet 0  . ranitidine (ZANTAC) 300 MG tablet Take 1 tablet (300 mg total) by mouth at bedtime.  30 tablet 2  . vitamin E 400 UNIT capsule Take 400 Units by mouth daily.     No current facility-administered medications for this visit.    No Known Allergies  Social History   Social History  . Marital Status: Married    Spouse Name: [name]  . Number of Children: 3  . Years of Education: N/A   Occupational History  . Not on file.   Social History Main Topics  . Smoking status: Former Smoker -- 1.50 packs/day for 20 years    Types: Cigarettes    Quit date: 08/24/1986  . Smokeless tobacco: Never Used  . Alcohol Use: 4.2 oz/week    7 Glasses of wine per week  . Drug Use: No  . Sexual Activity: Not on file   Other Topics Concern  . Not on file   Social History Narrative     Review of Systems: General: negative for chills, fever, night sweats or weight changes.  Cardiovascular: negative for chest pain, dyspnea on exertion, edema, orthopnea, palpitations, paroxysmal nocturnal dyspnea or shortness of breath Dermatological: negative for rash Respiratory: negative for cough or wheezing Urologic: negative for hematuria Abdominal: negative for nausea, vomiting, diarrhea, bright red blood per rectum, melena, or hematemesis Neurologic: negative for visual changes, syncope, or  dizziness All other systems reviewed and are otherwise negative except as noted above.    Blood pressure 132/74, pulse 80, height 5\' 8"  (1.727 m), weight 181 lb (82.101 kg).  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 NO CHF, no arrhythmia. He has had some trouble sleeping and anorexia but I reassured him this was not uncommon. Hopefully its not from the Amiodarone, if it get worse we may have to cut this back sooner. He had "severe" LVD at cath. A TEE was done at time of CABG but I could not access the the final report. I ordered an echo for LVF. I suggested he decrease his Amiodarone to 200 mg daily in 10 days. F/U Dr Martinique in 3 months, we could consider stopping Amiodarone then. I 'll also check labs today as he had acute on CRI peri op.   Kerin Ransom K PA-C 04/06/2015 2:28 PM

## 2015-04-06 NOTE — Assessment & Plan Note (Signed)
SCr 1.5-2.0, not on ACE or ARB

## 2015-04-06 NOTE — Assessment & Plan Note (Signed)
On statin Rx 

## 2015-04-06 NOTE — Assessment & Plan Note (Signed)
1998 

## 2015-04-06 NOTE — Assessment & Plan Note (Signed)
"  Severe" LVD at cath 03/18/15

## 2015-04-06 NOTE — Assessment & Plan Note (Signed)
Urgent CABG x 3 with LIMA-LAD, SVG-OM2, SVG-PL

## 2015-04-07 LAB — CBC WITH DIFFERENTIAL/PLATELET
Basophils Absolute: 0 10*3/uL (ref 0.0–0.1)
Basophils Relative: 0 % (ref 0–1)
Eosinophils Absolute: 0.3 10*3/uL (ref 0.0–0.7)
Eosinophils Relative: 3 % (ref 0–5)
HCT: 36.5 % — ABNORMAL LOW (ref 39.0–52.0)
Hemoglobin: 11.7 g/dL — ABNORMAL LOW (ref 13.0–17.0)
Lymphocytes Relative: 13 % (ref 12–46)
Lymphs Abs: 1.1 10*3/uL (ref 0.7–4.0)
MCH: 31 pg (ref 26.0–34.0)
MCHC: 32.1 g/dL (ref 30.0–36.0)
MCV: 96.6 fL (ref 78.0–100.0)
MPV: 10.8 fL (ref 8.6–12.4)
Monocytes Absolute: 0.7 10*3/uL (ref 0.1–1.0)
Monocytes Relative: 8 % (ref 3–12)
Neutro Abs: 6.7 10*3/uL (ref 1.7–7.7)
Neutrophils Relative %: 76 % (ref 43–77)
Platelets: 309 10*3/uL (ref 150–400)
RBC: 3.78 MIL/uL — ABNORMAL LOW (ref 4.22–5.81)
RDW: 14.8 % (ref 11.5–15.5)
WBC: 8.8 10*3/uL (ref 4.0–10.5)

## 2015-04-07 LAB — BASIC METABOLIC PANEL
BUN: 23 mg/dL (ref 7–25)
CO2: 24 mmol/L (ref 20–31)
Calcium: 9.6 mg/dL (ref 8.6–10.3)
Chloride: 97 mmol/L — ABNORMAL LOW (ref 98–110)
Creat: 1.23 mg/dL — ABNORMAL HIGH (ref 0.70–1.18)
Glucose, Bld: 149 mg/dL — ABNORMAL HIGH (ref 65–99)
Potassium: 5.2 mmol/L (ref 3.5–5.3)
Sodium: 135 mmol/L (ref 135–146)

## 2015-04-10 ENCOUNTER — Other Ambulatory Visit: Payer: Self-pay | Admitting: Internal Medicine

## 2015-04-13 ENCOUNTER — Telehealth: Payer: Self-pay | Admitting: Cardiology

## 2015-04-13 NOTE — Telephone Encounter (Signed)
Returned call to patient's wife.She stated husband does not feel well today.Has had diarrhea 3 times this morning.No abdominal pain at present.Stated he is not sleeping at night and has no appetite.Since his surgery he has losted about 29 lbs.Message sent to [place] for advice.

## 2015-04-13 NOTE — Telephone Encounter (Signed)
I suspect this is related to amiodarone. He should stop this now and see if symptoms resolve.  Rogelio Waynick Martinique MD, [facility]

## 2015-04-13 NOTE — Addendum Note (Signed)
Addended by: Golden Hurter D on: 04/13/2015 04:09 PM   Modules accepted: Orders, Medications

## 2015-04-13 NOTE — Telephone Encounter (Signed)
[name] is calling because Mr Miguel Vazquez had open heart about on 03/17/16 and is now having some issues w/diaherra and wants to know is there something that he can take or should she give him something for it , also have lost a lot of weight he went from 179lbs-150lbs. Please call   Thanks

## 2015-04-13 NOTE — Telephone Encounter (Signed)
Returned call to patient's wife Dr.Jordan's recommendations given.Advised to call back if not better.

## 2015-04-14 ENCOUNTER — Other Ambulatory Visit: Payer: Self-pay

## 2015-04-14 ENCOUNTER — Ambulatory Visit (HOSPITAL_COMMUNITY): Payer: PPO | Attending: Cardiology

## 2015-04-14 DIAGNOSIS — I255 Ischemic cardiomyopathy: Secondary | ICD-10-CM | POA: Diagnosis not present

## 2015-04-14 DIAGNOSIS — I071 Rheumatic tricuspid insufficiency: Secondary | ICD-10-CM | POA: Insufficient documentation

## 2015-04-14 DIAGNOSIS — I129 Hypertensive chronic kidney disease with stage 1 through stage 4 chronic kidney disease, or unspecified chronic kidney disease: Secondary | ICD-10-CM | POA: Diagnosis not present

## 2015-04-14 DIAGNOSIS — E1122 Type 2 diabetes mellitus with diabetic chronic kidney disease: Secondary | ICD-10-CM

## 2015-04-14 DIAGNOSIS — I059 Rheumatic mitral valve disease, unspecified: Secondary | ICD-10-CM | POA: Insufficient documentation

## 2015-04-14 DIAGNOSIS — I480 Paroxysmal atrial fibrillation: Secondary | ICD-10-CM

## 2015-04-14 DIAGNOSIS — I517 Cardiomegaly: Secondary | ICD-10-CM | POA: Diagnosis not present

## 2015-04-14 DIAGNOSIS — I313 Pericardial effusion (noninflammatory): Secondary | ICD-10-CM | POA: Insufficient documentation

## 2015-04-14 DIAGNOSIS — I2111 ST elevation (STEMI) myocardial infarction involving right coronary artery: Secondary | ICD-10-CM

## 2015-04-14 DIAGNOSIS — E119 Type 2 diabetes mellitus without complications: Secondary | ICD-10-CM | POA: Diagnosis not present

## 2015-04-14 DIAGNOSIS — Z951 Presence of aortocoronary bypass graft: Secondary | ICD-10-CM

## 2015-04-14 DIAGNOSIS — N183 Chronic kidney disease, stage 3 unspecified: Secondary | ICD-10-CM

## 2015-04-14 DIAGNOSIS — E785 Hyperlipidemia, unspecified: Secondary | ICD-10-CM | POA: Diagnosis not present

## 2015-04-14 DIAGNOSIS — I10 Essential (primary) hypertension: Secondary | ICD-10-CM

## 2015-04-14 DIAGNOSIS — Z9889 Other specified postprocedural states: Secondary | ICD-10-CM | POA: Diagnosis not present

## 2015-04-14 DIAGNOSIS — Z87891 Personal history of nicotine dependence: Secondary | ICD-10-CM | POA: Insufficient documentation

## 2015-04-15 ENCOUNTER — Other Ambulatory Visit: Payer: Self-pay | Admitting: Cardiology

## 2015-04-15 ENCOUNTER — Telehealth: Payer: Self-pay | Admitting: *Deleted

## 2015-04-15 ENCOUNTER — Encounter: Payer: Self-pay | Admitting: Cardiology

## 2015-04-15 DIAGNOSIS — I255 Ischemic cardiomyopathy: Secondary | ICD-10-CM

## 2015-04-15 DIAGNOSIS — N183 Chronic kidney disease, stage 3 unspecified: Secondary | ICD-10-CM

## 2015-04-15 MED ORDER — LISINOPRIL 10 MG PO TABS: 10.0000 mg | ORAL_TABLET | Freq: Every day | ORAL | Status: DC

## 2015-04-15 NOTE — Progress Notes (Signed)
Patient ID: Miguel Vazquez, male   DOB: Mar 28, 1941, 75 y.o.   MRN: [id number]  Miguel Vazquez echo shows he still has significant LVD.-EF  35-40%. He had been on an ACE before his CABG but this was stopped during his hospitalization. He has CRI-stage 3. Recent BMP showed his K+ to be 5.2 but his K+ was consistently < 4.0 when he was in the hospital. I started him on Lisinopril 10 mg and ordered a BMP in one week. This will need close f/u.  Marland KitchenKerin Ransom PA-C 04/15/2015 8:17 AM

## 2015-04-15 NOTE — Telephone Encounter (Signed)
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 Start Lisinopril 10 mg daily, check BMP one week after he starts this.  Kerin Ransom PA-C 04/15/2015 8:07 AM

## 2015-04-15 NOTE — Telephone Encounter (Signed)
Spoke with pt, aware of results. New script sent to the pharmacy Lab orders mailed to the pt  

## 2015-04-18 ENCOUNTER — Telehealth: Payer: Self-pay | Admitting: Cardiology

## 2015-04-18 NOTE — Telephone Encounter (Signed)
Returned call to patient's wife no answer.LMTC. 

## 2015-04-18 NOTE — Telephone Encounter (Signed)
Pt's wife called in wanting to know if it is ok for him to take Tyenolol PM since he isn't sleeping well and she also wanted to know if a mild Anti- depressant because since his MI has hasn't been "much like" hisself. Please f/u with her  Thanks

## 2015-04-18 NOTE — Telephone Encounter (Signed)
  Pt's wife called in wanting to know if it is ok for him to take Tyenolol PM since he isn't sleeping well and she also wanted to know if a mild Anti- depressant because since his MI has hasn't been "much like" hisself. Please f/u with her        Told wife that it was ok to Tylenol PM as directed on bottle as long as it is not within 4 hours of any pain medications and if his BP is not running too high  Wife feels he is a little depressed and wonders is a mild anti-depressant would be a good idea

## 2015-04-18 NOTE — Telephone Encounter (Signed)
He is ok to take Tylenol PM. He should talk to Dr. Elease Hashimoto about an anti-depressant.  Miguel Sjogren Martinique MD, [facility]

## 2015-04-20 NOTE — Telephone Encounter (Signed)
Returned call to patient 04/19/15.Dr.Jordan's recommendations given.

## 2015-04-22 DIAGNOSIS — I255 Ischemic cardiomyopathy: Secondary | ICD-10-CM | POA: Diagnosis not present

## 2015-04-22 DIAGNOSIS — I2589 Other forms of chronic ischemic heart disease: Secondary | ICD-10-CM | POA: Diagnosis not present

## 2015-04-23 LAB — BASIC METABOLIC PANEL
BUN: 27 mg/dL — ABNORMAL HIGH (ref 7–25)
CO2: 22 mmol/L (ref 20–31)
Calcium: 9.7 mg/dL (ref 8.6–10.3)
Chloride: 100 mmol/L (ref 98–110)
Creat: 1.26 mg/dL — ABNORMAL HIGH (ref 0.70–1.18)
Glucose, Bld: 164 mg/dL — ABNORMAL HIGH (ref 65–99)
Potassium: 5.8 mmol/L — ABNORMAL HIGH (ref 3.5–5.3)
Sodium: 134 mmol/L — ABNORMAL LOW (ref 135–146)

## 2015-04-25 ENCOUNTER — Other Ambulatory Visit: Payer: Self-pay | Admitting: Thoracic Surgery (Cardiothoracic Vascular Surgery)

## 2015-04-25 DIAGNOSIS — Z951 Presence of aortocoronary bypass graft: Secondary | ICD-10-CM

## 2015-04-26 ENCOUNTER — Telehealth: Payer: Self-pay | Admitting: *Deleted

## 2015-04-26 ENCOUNTER — Ambulatory Visit
Admission: RE | Admit: 2015-04-26 | Discharge: 2015-04-26 | Disposition: A | Discharge status: Home / Self Care | Payer: PPO | Source: Ambulatory Visit | Attending: Thoracic Surgery (Cardiothoracic Vascular Surgery) | Admitting: Thoracic Surgery (Cardiothoracic Vascular Surgery)

## 2015-04-26 ENCOUNTER — Other Ambulatory Visit: Payer: Self-pay | Admitting: Cardiology

## 2015-04-26 ENCOUNTER — Ambulatory Visit (INDEPENDENT_AMBULATORY_CARE_PROVIDER_SITE_OTHER): Payer: Self-pay | Admitting: Thoracic Surgery (Cardiothoracic Vascular Surgery)

## 2015-04-26 VITALS — BP 108/74 | HR 73 | Resp 16 | Ht 68.0 in | Wt 150.0 lb

## 2015-04-26 DIAGNOSIS — Z951 Presence of aortocoronary bypass graft: Secondary | ICD-10-CM | POA: Diagnosis not present

## 2015-04-26 DIAGNOSIS — N183 Chronic kidney disease, stage 3 unspecified: Secondary | ICD-10-CM

## 2015-04-26 DIAGNOSIS — E875 Hyperkalemia: Secondary | ICD-10-CM

## 2015-04-26 DIAGNOSIS — I2510 Atherosclerotic heart disease of native coronary artery without angina pectoris: Secondary | ICD-10-CM

## 2015-04-26 IMAGING — CR DG CHEST 2V
2 series · 2 of 2 positions shown · non-contrast
Comparison: [DATE] .

CLINICAL DATA: CABG.

EXAM:
CHEST  2 VIEW

[w chest pa]
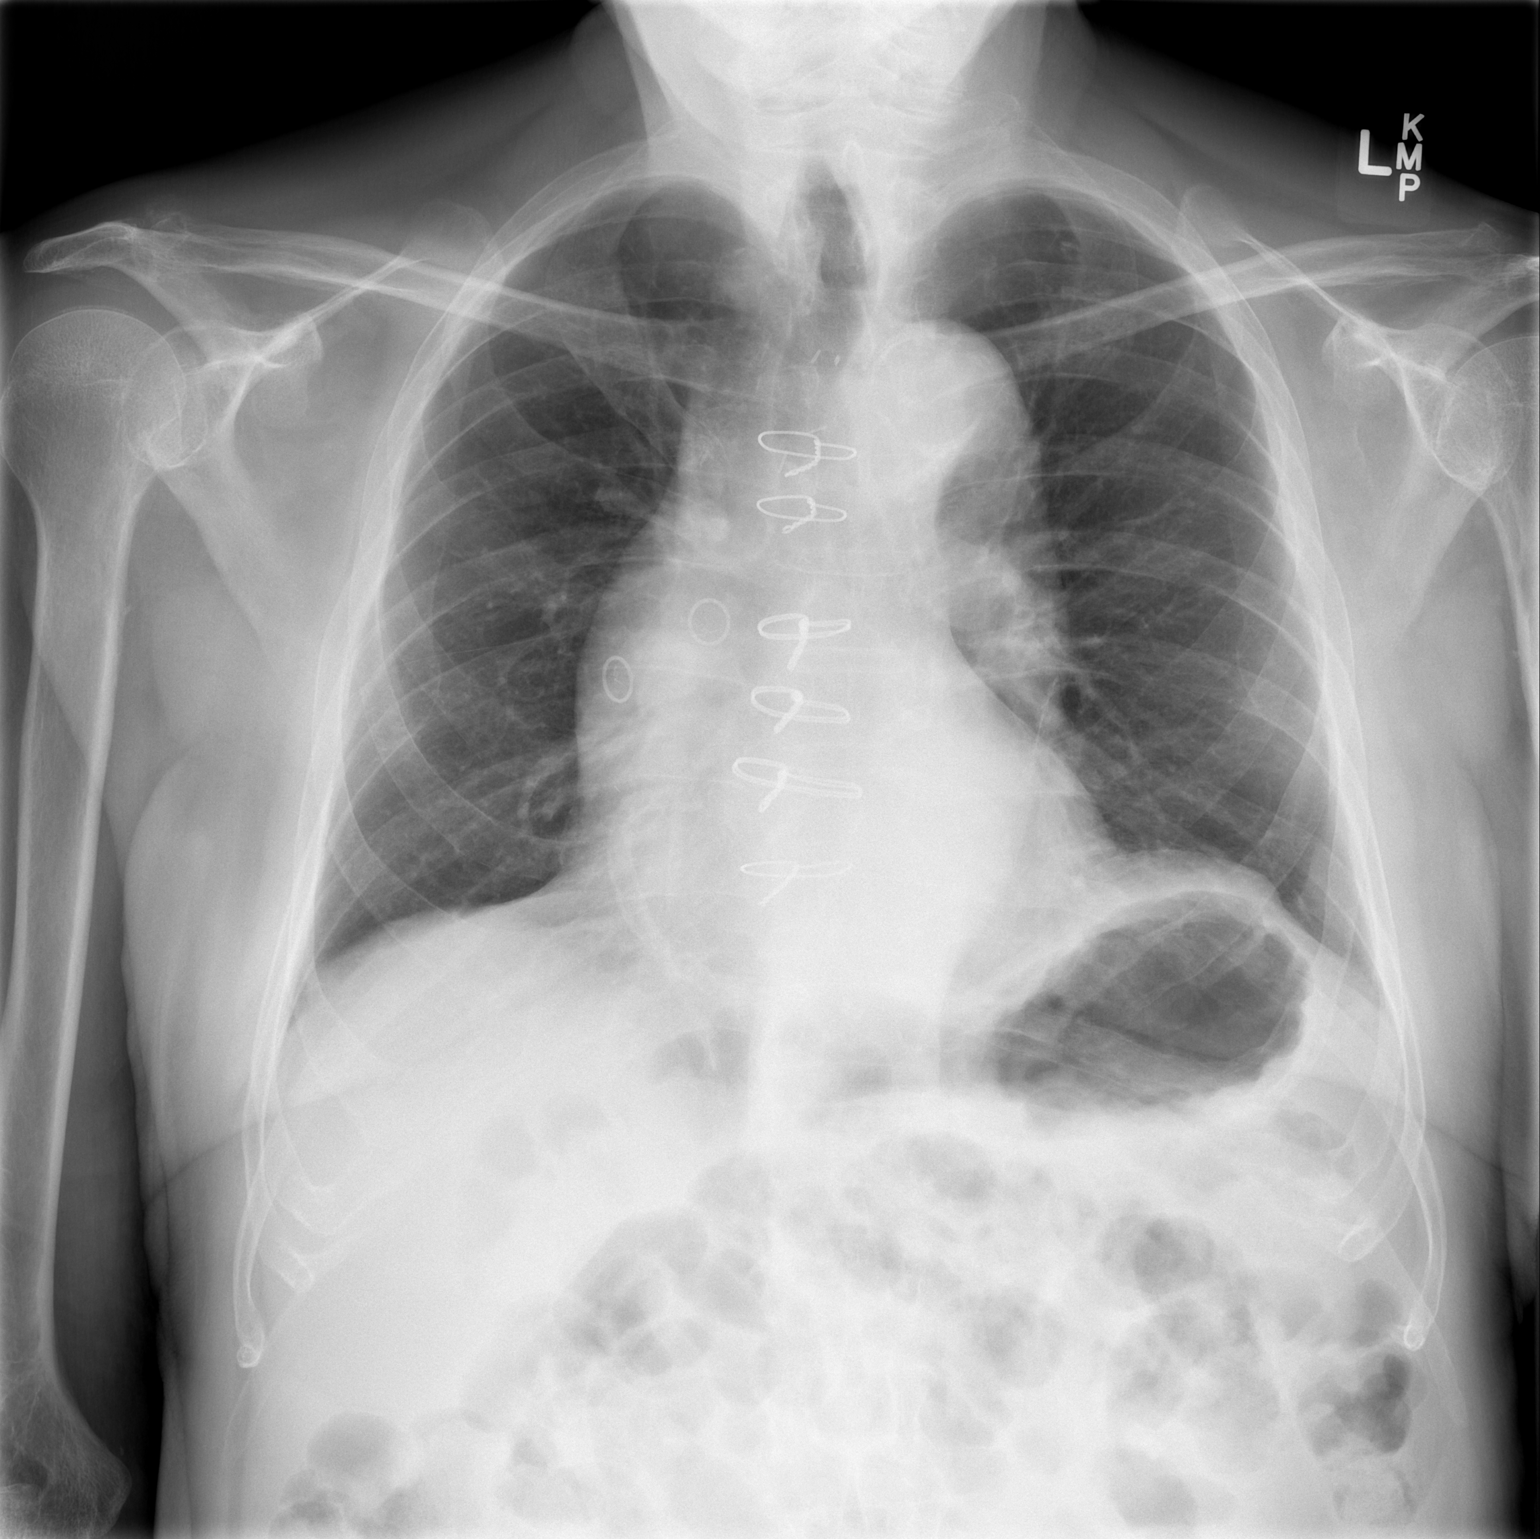

[w chest lat]
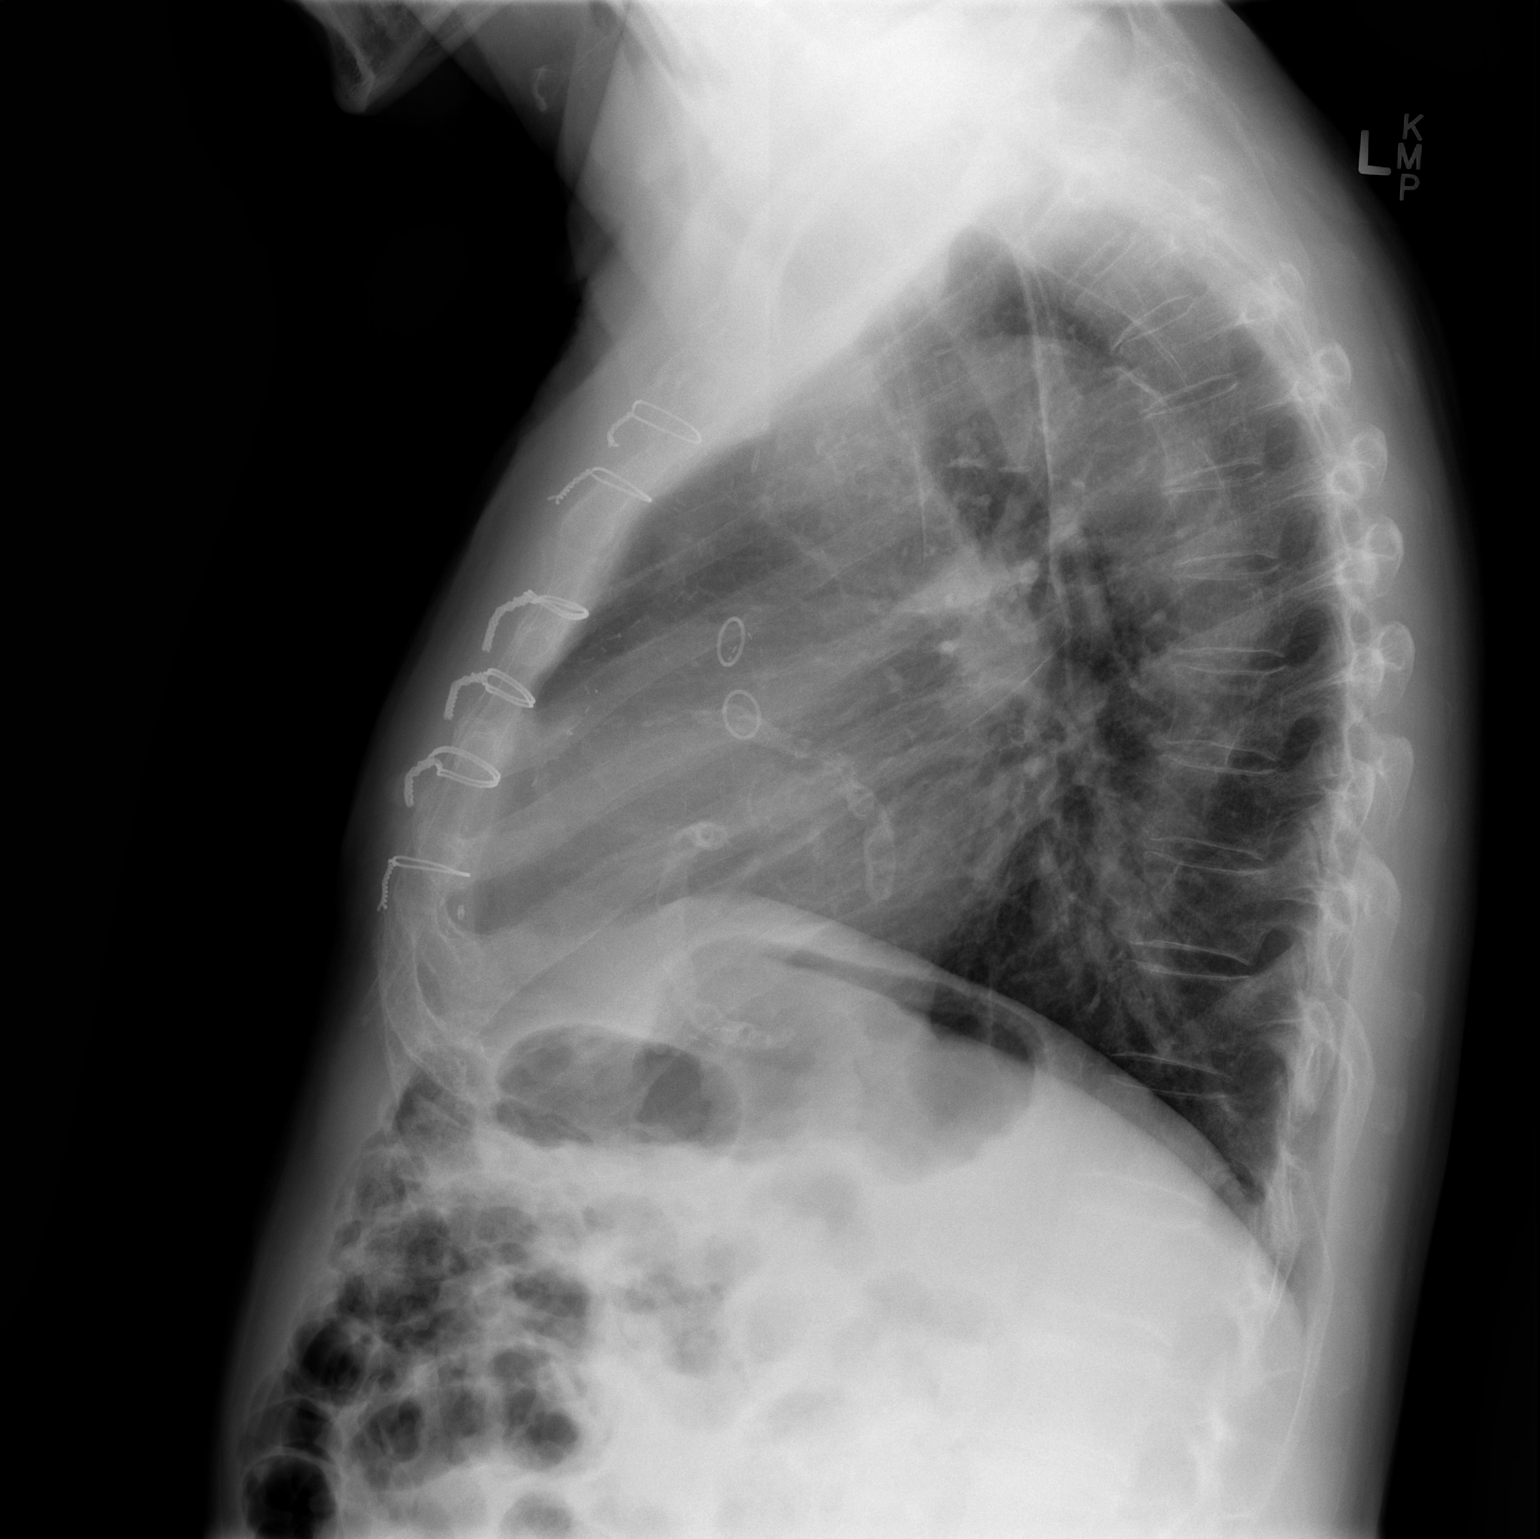

[2 of 2 positions shown; findings below may reference images not displayed]

FINDINGS: Prior CABG. No focal infiltrate. No pleural effusion or
pneumothorax. No acute bony abnormality. Air-filled loops of small
large bowel noted. Mild adynamic ileus cannot be excluded
IMPRESSION: 1. Prior CABG.  Heart size stable.

2.  No acute pulmonary disease.

3. Air-filled loops of small and large bowel noted. Mild adynamic
ileus cannot be excluded.

## 2015-04-26 NOTE — Progress Notes (Signed)
[address]       [place] [address]             [phone]       HPI: Miguel Vazquez returns today for a scheduled postoperative follow-up visit.  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 That was on 03/18/2015. Postoperatively he had some atrial fibrillation. He converted to sinus rhythm and was discharged on amiodarone. He then was discharged on Christmas Eve.  He is feeling well. He is not having any incisional pain. He has not had to take any narcotics. He denies "indigestion." He has had a lot of belching, but says he is having normal flatus and bowel movements. He walked a mile at his church yesterday.  Past Medical History  Diagnosis Date  . Hypertension   . Hyperlipidemia   . Diabetes mellitus   . Arthritis   . Hx of abdominal aortic aneurysm 1998  . Personal history of colonic polyps - adenomas 09/09/2013  . Cataract   . GERD (gastroesophageal reflux disease)       Current Outpatient Prescriptions  Medication Sig Dispense Refill  . aspirin EC 325 MG EC tablet Take 1 tablet (325 mg total) by mouth daily. 30 tablet 0  . atorvastatin (LIPITOR) 40 MG tablet TAKE 1 TABLET (40 MG TOTAL) BY MOUTH DAILY. 90 tablet 1  . beta carotene w/minerals (OCUVITE) tablet Take 1 tablet by mouth daily.    . carvedilol (COREG) 3.125 MG tablet Take 1 tablet (3.125 mg total) by mouth 2 (two) times daily with a meal. 60 tablet 1  . glucose blood (BAYER CONTOUR NEXT TEST) test strip Test blood sugars as needed 100 each 4  . KRILL OIL PO Take 1 capsule by mouth daily.     Marland Kitchen lisinopril (PRINIVIL,ZESTRIL) 10 MG tablet Take 1 tablet (10 mg total) by mouth daily. 90 tablet 3  . metFORMIN (GLUCOPHAGE) 500 MG tablet TAKE 2 TABLETS BY MOUTH TWICE A DAY 120 tablet 1  . Multiple Vitamin (MULTIVITAMIN) tablet Take 1 tablet by mouth daily. Centrium Silver once daily     . omeprazole  (PRILOSEC) 40 MG capsule TAKE 1 CAPSULE (40 MG TOTAL) BY MOUTH DAILY BEFORE BREAKFAST. 30 capsule 2  . ondansetron (ZOFRAN-ODT) 4 MG disintegrating tablet Take 4 mg by mouth every 4 (four) hours as needed for nausea.     Marland Kitchen oxyCODONE (OXY IR/ROXICODONE) 5 MG immediate release tablet Take 1-2 tablets (5-10 mg total) by mouth every 4 (four) hours as needed for severe pain. 30 tablet 0  . vitamin E 400 UNIT capsule Take 400 Units by mouth daily.     No current facility-administered medications for this visit.    Physical Exam BP 108/74 mmHg  Pulse 73  Resp 16  Ht 5\' 8"  (1.727 m)  Wt 150 lb (68.04 kg)  BMI 22.42 kg/m73 75 year old man in no acute distress Alert and oriented 3 with no focal deficits Cardiac regular rate and rhythm normal S1 and S2, no rub Lungs clear with equal breath says bilaterally Sternum stable, incision healing well No peripheral edema, leg incisions healing well  Diagnostic Tests: CHEST 2 VIEW  COMPARISON: 03/23/2015 .  FINDINGS: Prior CABG. No focal infiltrate. No pleural effusion or pneumothorax. No acute bony abnormality. Air-filled loops of small large bowel noted. Mild adynamic ileus cannot be excluded  IMPRESSION: 1. Prior CABG. Heart size stable.  2. No acute pulmonary disease.  3. Air-filled loops of small and large bowel noted. Mild adynamic ileus cannot be excluded.   Electronically Signed  By: [place]  I personally reviewed the chest x-ray confirmed the findings as noted above. Impression: 75 year old man who is now about a month out from emergency coronary bypass grafting 3 for left main and three-vessel disease in the setting of a STEMI. He is doing extremely well at this time.  He has not had any recurrent "indigestion" which was his anginal pain. He is not having any incisional pain. His exercise tolerance is excellent for this early postop.  He may begin driving. Appropriate precautions were discussed. He should  not lift anything over 10 pounds for another week or anything over 20 pounds for 3 more weeks. After that his activities are unrestricted. I did caution him to build into new activities gradually.  He had atrial fibrillation postoperatively, but was in sinus rhythm time of discharge. He was discharged on amiodarone. That has been discontinued.  He is having some belching and his chest x-ray showed a lot of air in the bowel. I recommended that he follow up with Dr. Elease Hashimoto and Gross if he has any abdominal issues.  He will follow-up with Dr. Martinique for his cardiac issues.  Plan: I will be happy to see [name] back any time the future vitamin be of any further cysts with his care.  Melrose Nakayama, MD [facility] [phone]

## 2015-04-26 NOTE — Telephone Encounter (Addendum)
-----   Message from Erlene Quan, [place] sent at 04/26/2015  1:45 PM EST ----- Pt's K+ up to 5.8- stop Lisinopril. Check BMP in one month.  Kerin Ransom PA-C 04/26/2015 1:45 PM  Left message for pt to call

## 2015-04-27 NOTE — Telephone Encounter (Signed)
Returning your call. °

## 2015-04-27 NOTE — Telephone Encounter (Signed)
Spoke with pt, she voiced understanding of medication change. Lab orders mailed to the pt

## 2015-04-28 ENCOUNTER — Encounter (HOSPITAL_COMMUNITY)
Admission: RE | Admit: 2015-04-28 | Discharge: 2015-04-28 | Disposition: A | Discharge status: Home / Self Care | Payer: PPO | Source: Ambulatory Visit | Attending: Cardiology | Admitting: Cardiology

## 2015-04-28 DIAGNOSIS — I2111 ST elevation (STEMI) myocardial infarction involving right coronary artery: Secondary | ICD-10-CM | POA: Insufficient documentation

## 2015-04-28 DIAGNOSIS — Z951 Presence of aortocoronary bypass graft: Secondary | ICD-10-CM | POA: Insufficient documentation

## 2015-04-28 NOTE — Progress Notes (Signed)
Cardiac Rehab Medication Review by a Pharmacist  Does the patient  feel that his/her medications are working for him/her?  yes  Has the patient been experiencing any side effects to the medications prescribed?  no  Does the patient measure his/her own blood pressure or blood glucose at home?  yes   Does the patient have any problems obtaining medications due to transportation or finances?   no  Understanding of regimen: fair Understanding of indications: excellent Potential of compliance: excellent    Pharmacist comments: Wife helps patient with medications.    Judieth Keens 04/28/2015 9:08 AM

## 2015-04-30 ENCOUNTER — Other Ambulatory Visit: Payer: Self-pay | Admitting: Family Medicine

## 2015-05-02 ENCOUNTER — Other Ambulatory Visit: Payer: Self-pay | Admitting: Family Medicine

## 2015-05-02 ENCOUNTER — Encounter (HOSPITAL_COMMUNITY)
Admission: RE | Admit: 2015-05-02 | Discharge: 2015-05-02 | Disposition: A | Discharge status: Home / Self Care | Payer: PPO | Source: Ambulatory Visit | Attending: Cardiology | Admitting: Cardiology

## 2015-05-02 DIAGNOSIS — Z951 Presence of aortocoronary bypass graft: Secondary | ICD-10-CM | POA: Diagnosis not present

## 2015-05-02 DIAGNOSIS — I2111 ST elevation (STEMI) myocardial infarction involving right coronary artery: Secondary | ICD-10-CM | POA: Diagnosis not present

## 2015-05-02 LAB — GLUCOSE, CAPILLARY
GLUCOSE-CAPILLARY: 88 mg/dL (ref 65–99)
GLUCOSE-CAPILLARY: 95 mg/dL (ref 65–99)

## 2015-05-02 NOTE — Progress Notes (Signed)
Pt in for  cardiac rehab today. Pt pre exercise blood glucose was 88.  Pt given lemonade and banana to eat.  Pt had a peanut butter biscuit early this morning.  Pt did not check his blood glucose at home prior to coming to exercise.  Pt rechecked in 15 minutes.  Reading increased to 95.  Pt unable to exercise today due to cardiac rehab blood glucose protocol.  Pt given instructions on eating a protein prior to coming to exercise and always check home blood glucose to ensure appropriate to take the metformin.  Telemetry-SR with rare PVC's, asymptomatic.  Medication list reconciled.  Pt verbalized compliance with medications and denies barriers to compliance. PSYCHOSOCIAL ASSESSMENT:  PHQ-0. Pt exhibits positive coping skills, hopeful outlook with supportive family. Pt support system includes his wife and their son who has is mentally challenged and resides them in the home. No psychosocial needs identified at this time, no psychosocial interventions necessary.    [place]   Pt cardiac rehab  goal is  to build strength.  Pt encouraged to participate in home exercise  to increase ability to achieve these goals. 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  Pt is hopeful that attending cardiac rehab will help him be able to do the yard work and minor repairs for the properties.  Pt long term cardiac rehab goal is get back to old self and be able to lift items with no restrictions. Will monitor pt progress toward feeling confident in his abilities without any restrictions.  Pt oriented to exercise equipment and routine.  Understanding verbalized. Cherre Huger, BSN

## 2015-05-04 ENCOUNTER — Encounter (HOSPITAL_COMMUNITY)
Admission: RE | Admit: 2015-05-04 | Discharge: 2015-05-04 | Disposition: A | Discharge status: Home / Self Care | Payer: PPO | Source: Ambulatory Visit | Attending: Cardiology | Admitting: Cardiology

## 2015-05-04 DIAGNOSIS — I2111 ST elevation (STEMI) myocardial infarction involving right coronary artery: Secondary | ICD-10-CM | POA: Insufficient documentation

## 2015-05-04 DIAGNOSIS — Z951 Presence of aortocoronary bypass graft: Secondary | ICD-10-CM | POA: Insufficient documentation

## 2015-05-04 LAB — GLUCOSE, CAPILLARY
Glucose-Capillary: 104 mg/dL — ABNORMAL HIGH (ref 65–99)
Glucose-Capillary: 118 mg/dL — ABNORMAL HIGH (ref 65–99)

## 2015-05-04 NOTE — Progress Notes (Signed)
Pt returned to exercise today.  Pt attended anatomy education class.  Pt pre exercise blood glucose 104.  Pt given lemonade to drink during exercise.  Pt believes he did not take his metformin this morning. Pt tolerated exercise with no complaints. Post exercise blood glucose 118.  Pt plans to eat lunch when he returns home [place], BSN

## 2015-05-06 ENCOUNTER — Ambulatory Visit (INDEPENDENT_AMBULATORY_CARE_PROVIDER_SITE_OTHER): Payer: PPO | Admitting: Family Medicine

## 2015-05-06 ENCOUNTER — Encounter (HOSPITAL_COMMUNITY)
Admission: RE | Admit: 2015-05-06 | Discharge: 2015-05-06 | Disposition: A | Discharge status: Home / Self Care | Payer: PPO | Source: Ambulatory Visit | Attending: Cardiology | Admitting: Cardiology

## 2015-05-06 ENCOUNTER — Encounter: Payer: Self-pay | Admitting: Family Medicine

## 2015-05-06 VITALS — BP 120/80 | HR 84 | Temp 97.4°F | Ht 65.5 in | Wt 155.5 lb

## 2015-05-06 DIAGNOSIS — E1122 Type 2 diabetes mellitus with diabetic chronic kidney disease: Secondary | ICD-10-CM

## 2015-05-06 DIAGNOSIS — Z951 Presence of aortocoronary bypass graft: Secondary | ICD-10-CM | POA: Diagnosis not present

## 2015-05-06 DIAGNOSIS — N183 Chronic kidney disease, stage 3 unspecified: Secondary | ICD-10-CM

## 2015-05-06 LAB — GLUCOSE, CAPILLARY
Glucose-Capillary: 86 mg/dL (ref 65–99)
Glucose-Capillary: 94 mg/dL (ref 65–99)

## 2015-05-06 NOTE — Progress Notes (Signed)
Subjective:    Patient ID: Miguel Vazquez, male    DOB: 1941-01-31, 75 y.o.   MRN: [id number]  HPI Patient is here with concerns regarding recent blood sugar Had recent CABG and has started cardiac rehabilitation. He has type 2 diabetes and takes metformin 500 mg 2 tablets twice daily. He has lost considerable amount of weight with his recent hospitalization and cardiac issues and dietary changes. He has not had any hypoglycemic symptoms as he only takes metformin for diabetes but rehabilitation protocols stipulate that he cannot exercise with blood sugar less than 100.  Patient states this morning he had one hour postprandial blood sugar 171 and 84 when he went for cardiac rehabilitation. He has never had any hypoglycemic symptoms.  Past Medical History  Diagnosis Date  . Hypertension   . Hyperlipidemia   . Diabetes mellitus   . Arthritis   . Hx of abdominal aortic aneurysm 1998  . Personal history of colonic polyps - adenomas 09/09/2013  . Cataract   . GERD (gastroesophageal reflux disease)    Past Surgical History  Procedure Laterality Date  . Abdominal aortic aneurysm repair  1998  . Umbilical hernia repair  1965  . Colonoscopy    . Cardiac catheterization N/A 03/18/2015    Procedure: Left Heart Cath and Coronary Angiography;  Surgeon: Peter M Martinique, MD;  Location: [place] [facility];  Service: Cardiovascular;  Laterality: N/A;  . Coronary artery bypass graft N/A 03/18/2015    Procedure: CORONARY ARTERY BYPASS GRAFTING (CABG) x  three, using left internal mammary artery and right leg greater saphenous vein harvested endoscopically;  Surgeon: Melrose Nakayama, MD;  Location: [place];  Service: Open Heart Surgery;  Laterality: N/A;  . Intraoperative transesophageal echocardiogram N/A 03/18/2015    Procedure: INTRAOPERATIVE TRANSESOPHAGEAL ECHOCARDIOGRAM;  Surgeon: Melrose Nakayama, MD;  Location: [place];  Service: Open Heart Surgery;  Laterality: N/A;    reports that he quit  smoking about 28 years ago. His smoking use included Cigarettes. He has a 30 pack-year smoking history. He has never used smokeless tobacco. He reports that he drinks about 4.2 oz of alcohol per week. He reports that he does not use illicit drugs. family history includes Diabetes in his paternal aunt. There is no history of Colon cancer. Allergies  Allergen Reactions  . Ace Inhibitors Other (See Comments)    Hyperkaylemia      Review of Systems  Constitutional: Negative for fatigue.  Eyes: Negative for visual disturbance.  Respiratory: Negative for cough, chest tightness and shortness of breath.   Cardiovascular: Negative for chest pain, palpitations and leg swelling.  Endocrine: Negative for polydipsia and polyuria.  Neurological: Negative for dizziness, syncope, weakness, light-headedness and headaches.       Objective:   Physical Exam  Constitutional: He is oriented to person, place, and time. He appears well-developed and well-nourished.  HENT:  Right Ear: External ear normal.  Left Ear: External ear normal.  Mouth/Throat: Oropharynx is clear and moist.  Eyes: Pupils are equal, round, and reactive to light.  Neck: Neck supple. No thyromegaly present.  Cardiovascular: Normal rate and regular rhythm.   Pulmonary/Chest: Effort normal and breath sounds normal. No respiratory distress. He has no wheezes. He has no rales.  Musculoskeletal: He exhibits no edema.  Neurological: He is alert and oriented to person, place, and time.          Assessment & Plan:  Type 2 diabetes. He has not been checking many fasting sugars.  Blood sugars are likely improved overall with recent weight loss. Obviously, we are not concerned about clinically significant hypoglycemia with being on metformin only but rehabilitation protocol prohibits patient participation for blood sugar less than 100. Reduce metformin to 500 mg twice daily. If he continues to have blood sugars next week below 100 with  rehabilitation will drop his nighttime dose of metformin as well.  Plan routine follow-up 2 months and repeat A1c then

## 2015-05-06 NOTE — Progress Notes (Signed)
Pre visit review using our clinic review tool, if applicable. No additional management support is needed unless otherwise documented below in the visit note. 

## 2015-05-06 NOTE — Progress Notes (Signed)
Pt returned for third day of exercise.  Pre exercise blood glucose was 86. Pt surprised because his home blood glucose after breakfast 171.  Pt did not check a fasting blood glucose this morning.  Pt cut his metformin dose in half.  Pt took only 500 mg.  Pt given banana and apple juice.  Exercise stopped.  This is the second hypoglycemic event for this pt this week.  Pt primary MD - Dr. Sheldon Silvan office called. Appointment made for pt to be seen today at 3:15 to evaluate the metformin. Per cardiac rehab protocol blood glucose must be above 100 to proceed with exercise. Will send with pt his rehab reports demonstrating his blood glucose readings. Cherre Huger, BSN

## 2015-05-06 NOTE — Patient Instructions (Signed)
REDUCE Metformin to one twice daily IF morning blood sugars are dropping below 100 for rehab next week, go ahead and drop the night dose of Metformin.

## 2015-05-09 ENCOUNTER — Encounter (HOSPITAL_COMMUNITY)
Admission: RE | Admit: 2015-05-09 | Discharge: 2015-05-09 | Disposition: A | Discharge status: Home / Self Care | Payer: PPO | Source: Ambulatory Visit | Attending: Cardiology | Admitting: Cardiology

## 2015-05-09 DIAGNOSIS — Z951 Presence of aortocoronary bypass graft: Secondary | ICD-10-CM | POA: Diagnosis not present

## 2015-05-09 LAB — GLUCOSE, CAPILLARY
GLUCOSE-CAPILLARY: 101 mg/dL — AB (ref 65–99)
GLUCOSE-CAPILLARY: 105 mg/dL — AB (ref 65–99)

## 2015-05-11 ENCOUNTER — Encounter (HOSPITAL_COMMUNITY)
Admission: RE | Admit: 2015-05-11 | Discharge: 2015-05-11 | Disposition: A | Discharge status: Home / Self Care | Payer: PPO | Source: Ambulatory Visit | Attending: Cardiology | Admitting: Cardiology

## 2015-05-11 DIAGNOSIS — Z951 Presence of aortocoronary bypass graft: Secondary | ICD-10-CM | POA: Diagnosis not present

## 2015-05-11 LAB — GLUCOSE, CAPILLARY
GLUCOSE-CAPILLARY: 128 mg/dL — AB (ref 65–99)
Glucose-Capillary: 104 mg/dL — ABNORMAL HIGH (ref 65–99)

## 2015-05-11 NOTE — Progress Notes (Signed)
Miguel Vazquez 75 y.o. male Nutrition Note Spoke with pt. Nutrition Plan and Nutrition Survey goals reviewed with pt. Pt is working toward following the Therapeutic Lifestyle Changes diet. Pt is diabetic. Last A1c indicates blood glucose well-controlled. This Probation officer went over Diabetes Education test results. Pt checks 1 hour post-prandial CBG's once daily. Pt has struggled with pre-exercise CBG's being too low to exercise. Pt states he eats Cheerios and milk for breakfast before exercise. A more balanced breakfast before exercise discussed (e.g. Peanut butter toast or a heart healthy Egg McMuffin type sandwich). Pt states he held his Metformin last night and this morning. Post-prandial CBG reportedly 164 mg/dL. Pre-exercise CBG 104 mg/dL. Pt given a banana before exercise by RN and post-exercise CBG reportedly 128 mg/dL. Pt wants to continue to hold Metformin. Pt read MD's plan for tapering Metformin. Pt still wants to hold Metformin due to CBG's dropping significantly with exercise and 30 lb recent wt loss. Pt expressed understanding of the information reviewed. Pt aware of nutrition education classes offered.  Lab Results  Component Value Date   HGBA1C 6.8* 12/08/2014   Wt Readings from Last 3 Encounters:  05/06/15 155 lb 8 oz (70.534 kg)  04/28/15 155 lb 3.3 oz (70.4 kg)  04/26/15 150 lb (68.04 kg)   Nutrition Diagnosis ? Food-and nutrition-related knowledge deficit related to lack of exposure to information as related to diagnosis of: ? CVD ? DM  Nutrition RX/ Estimated Daily Nutrition Needs for: wt  maintenance 1950-2200 Kcal, 65-70 gm fat, 13-15 gm sat fat, 1.9-2.2 gm trans-fat, <1500 mg sodium, 250-325 gm CHO   Nutrition Intervention ? Pt's individual nutrition plan reviewed with pt. ? Benefits of adopting Therapeutic Lifestyle Changes discussed when Medficts reviewed. ? Pt to attend the Portion Distortion class ? Pt to attend the Diabetes Q & A class ? Pt to attend the   ? Nutrition I  class                  ? Nutrition II class     ? Diabetes Blitz class ? Pt given handouts for: ? Nutrition I class ? Nutrition II class ? Diabetes Blitz Class ? 5-day menu ideas ? pre-exercise blood sugar guidelines and suggestions for pre-exercise meals/snacks ? Continue client-centered nutrition education by RD, as part of interdisciplinary care. Goal(s) ? Pt to identify and limit food sources of saturated fat, trans fat, and sodium ? Use pre-meal and post-meal CBG's and A1c to determine whether adjustments in food/meal planning will be beneficial or if any meds need to be combined with nutrition therapy. Monitor and Evaluate progress toward nutrition goal with team. Nutrition Risk: Change to Moderate Derek Mound, M.Ed, RD, LDN, CDE 05/11/2015 12:16 PM

## 2015-05-13 ENCOUNTER — Encounter (HOSPITAL_COMMUNITY)
Admission: RE | Admit: 2015-05-13 | Discharge: 2015-05-13 | Disposition: A | Discharge status: Home / Self Care | Payer: PPO | Source: Ambulatory Visit | Attending: Cardiology | Admitting: Cardiology

## 2015-05-13 DIAGNOSIS — Z951 Presence of aortocoronary bypass graft: Secondary | ICD-10-CM | POA: Diagnosis not present

## 2015-05-13 LAB — GLUCOSE, CAPILLARY: GLUCOSE-CAPILLARY: 148 mg/dL — AB (ref 65–99)

## 2015-05-16 ENCOUNTER — Encounter (HOSPITAL_COMMUNITY)
Admission: RE | Admit: 2015-05-16 | Discharge: 2015-05-16 | Disposition: A | Discharge status: Home / Self Care | Payer: PPO | Source: Ambulatory Visit | Attending: Cardiology | Admitting: Cardiology

## 2015-05-16 DIAGNOSIS — Z951 Presence of aortocoronary bypass graft: Secondary | ICD-10-CM | POA: Diagnosis not present

## 2015-05-16 LAB — GLUCOSE, CAPILLARY: GLUCOSE-CAPILLARY: 108 mg/dL — AB (ref 65–99)

## 2015-05-18 ENCOUNTER — Encounter (HOSPITAL_COMMUNITY)
Admission: RE | Admit: 2015-05-18 | Discharge: 2015-05-18 | Disposition: A | Discharge status: Home / Self Care | Payer: PPO | Source: Ambulatory Visit | Attending: Cardiology | Admitting: Cardiology

## 2015-05-18 DIAGNOSIS — Z951 Presence of aortocoronary bypass graft: Secondary | ICD-10-CM | POA: Diagnosis not present

## 2015-05-18 LAB — GLUCOSE, CAPILLARY: Glucose-Capillary: 113 mg/dL — ABNORMAL HIGH (ref 65–99)

## 2015-05-20 ENCOUNTER — Encounter (HOSPITAL_COMMUNITY)
Admission: RE | Admit: 2015-05-20 | Discharge: 2015-05-20 | Disposition: A | Discharge status: Home / Self Care | Payer: PPO | Source: Ambulatory Visit | Attending: Cardiology | Admitting: Cardiology

## 2015-05-20 DIAGNOSIS — Z951 Presence of aortocoronary bypass graft: Secondary | ICD-10-CM | POA: Diagnosis not present

## 2015-05-20 LAB — GLUCOSE, CAPILLARY: Glucose-Capillary: 138 mg/dL — ABNORMAL HIGH (ref 65–99)

## 2015-05-20 NOTE — Progress Notes (Signed)
Reviewed home exercise with pt today.  Pt plans to continue to  walk at home and use gyms at local churches for exercise.  Reviewed THR, pulse, RPE, sign and symptoms, and when to call 911 or MD.  Pt voiced understanding. Alberteen Sam, MA, ACSM RCEP

## 2015-05-23 ENCOUNTER — Encounter (HOSPITAL_COMMUNITY)
Admission: RE | Admit: 2015-05-23 | Discharge: 2015-05-23 | Disposition: A | Discharge status: Home / Self Care | Payer: PPO | Source: Ambulatory Visit | Attending: Cardiology | Admitting: Cardiology

## 2015-05-23 DIAGNOSIS — Z951 Presence of aortocoronary bypass graft: Secondary | ICD-10-CM | POA: Diagnosis not present

## 2015-05-23 DIAGNOSIS — E875 Hyperkalemia: Secondary | ICD-10-CM | POA: Diagnosis not present

## 2015-05-23 LAB — BASIC METABOLIC PANEL
BUN: 16 mg/dL (ref 7–25)
CO2: 29 mmol/L (ref 20–31)
Calcium: 9.6 mg/dL (ref 8.6–10.3)
Chloride: 101 mmol/L (ref 98–110)
Creat: 0.81 mg/dL (ref 0.70–1.18)
Glucose, Bld: 116 mg/dL — ABNORMAL HIGH (ref 65–99)
Potassium: 4.5 mmol/L (ref 3.5–5.3)
Sodium: 139 mmol/L (ref 135–146)

## 2015-05-23 LAB — GLUCOSE, CAPILLARY: GLUCOSE-CAPILLARY: 145 mg/dL — AB (ref 65–99)

## 2015-05-23 NOTE — Progress Notes (Signed)
QUALITY OF LIFE SCORE REVIEW  Pt completed Quality of Life survey as a participant in Cardiac Rehab. Scores 21.0 or below are considered low.  Pt scored as following Overall 21.81, Health and Function 21.20, socioeconomic 23.64, physiological and spiritual 24.86, family 16.80. Patient quality of life slightly altered by physical constraints which limits ability to perform as prior to recent cardiac illness.  Pt main concern is for his family.  Pt has concerns regarding his wife's health.  Pt wife has had two occassions dealing with breast and and brain Cancer.  Pt adult child is mentally challenged and is beginning to show signs of dementia. Pt worries about his future but has made plans in a trust for his son to be cared for after he passes away.   Offered emotional support and reassurance.  Will continue to monitor and intervene as necessary.  Cherre Huger, BSN

## 2015-05-25 ENCOUNTER — Encounter (HOSPITAL_COMMUNITY)
Admission: RE | Admit: 2015-05-25 | Discharge: 2015-05-25 | Disposition: A | Discharge status: Home / Self Care | Payer: PPO | Source: Ambulatory Visit | Attending: Cardiology | Admitting: Cardiology

## 2015-05-25 DIAGNOSIS — Z951 Presence of aortocoronary bypass graft: Secondary | ICD-10-CM | POA: Diagnosis not present

## 2015-05-25 LAB — GLUCOSE, CAPILLARY: GLUCOSE-CAPILLARY: 127 mg/dL — AB (ref 65–99)

## 2015-05-26 ENCOUNTER — Telehealth: Payer: Self-pay | Admitting: Cardiology

## 2015-05-26 NOTE — Telephone Encounter (Signed)
Patient has been taking Carvddilot 3.125 mg 1 BID that was prescribed by Dr. Roxan Hockey.  Pharmacy will not refill---patient was told Dr. Martinique will have to call in the refill.  Does Dr. Martinique want patient to continue to take this medication?  Please advise.

## 2015-05-27 ENCOUNTER — Encounter (HOSPITAL_COMMUNITY)
Admission: RE | Admit: 2015-05-27 | Discharge: 2015-05-27 | Disposition: A | Discharge status: Home / Self Care | Payer: PPO | Source: Ambulatory Visit | Attending: Cardiology | Admitting: Cardiology

## 2015-05-27 DIAGNOSIS — Z951 Presence of aortocoronary bypass graft: Secondary | ICD-10-CM | POA: Diagnosis not present

## 2015-05-27 LAB — GLUCOSE, CAPILLARY: GLUCOSE-CAPILLARY: 138 mg/dL — AB (ref 65–99)

## 2015-05-27 MED ORDER — CARVEDILOL 3.125 MG PO TABS: 3.1250 mg | ORAL_TABLET | Freq: Two times a day (BID) | ORAL | Status: DC

## 2015-05-27 NOTE — Telephone Encounter (Signed)
Returned call to patient's wife.Carvedilol refill sent to pharmacy.Recent bmet results given.Advised keep appointment with Dr.Jordan 07/01/15 at 10:15 am.

## 2015-05-30 ENCOUNTER — Encounter (HOSPITAL_COMMUNITY)
Admission: RE | Admit: 2015-05-30 | Discharge: 2015-05-30 | Disposition: A | Discharge status: Home / Self Care | Payer: PPO | Source: Ambulatory Visit | Attending: Cardiology | Admitting: Cardiology

## 2015-05-30 DIAGNOSIS — Z951 Presence of aortocoronary bypass graft: Secondary | ICD-10-CM | POA: Diagnosis not present

## 2015-06-01 ENCOUNTER — Encounter (HOSPITAL_COMMUNITY)
Admission: RE | Admit: 2015-06-01 | Discharge: 2015-06-01 | Disposition: A | Discharge status: Home / Self Care | Payer: PPO | Source: Ambulatory Visit | Attending: Cardiology | Admitting: Cardiology

## 2015-06-01 DIAGNOSIS — I2111 ST elevation (STEMI) myocardial infarction involving right coronary artery: Secondary | ICD-10-CM | POA: Diagnosis not present

## 2015-06-01 DIAGNOSIS — Z951 Presence of aortocoronary bypass graft: Secondary | ICD-10-CM | POA: Insufficient documentation

## 2015-06-03 ENCOUNTER — Encounter (HOSPITAL_COMMUNITY)
Admission: RE | Admit: 2015-06-03 | Discharge: 2015-06-03 | Disposition: A | Discharge status: Home / Self Care | Payer: PPO | Source: Ambulatory Visit | Attending: Cardiology | Admitting: Cardiology

## 2015-06-03 DIAGNOSIS — Z951 Presence of aortocoronary bypass graft: Secondary | ICD-10-CM | POA: Diagnosis not present

## 2015-06-06 ENCOUNTER — Encounter (HOSPITAL_COMMUNITY)
Admission: RE | Admit: 2015-06-06 | Discharge: 2015-06-06 | Disposition: A | Discharge status: Home / Self Care | Payer: PPO | Source: Ambulatory Visit | Attending: Cardiology | Admitting: Cardiology

## 2015-06-06 DIAGNOSIS — Z951 Presence of aortocoronary bypass graft: Secondary | ICD-10-CM | POA: Diagnosis not present

## 2015-06-06 LAB — GLUCOSE, CAPILLARY
GLUCOSE-CAPILLARY: 117 mg/dL — AB (ref 65–99)
GLUCOSE-CAPILLARY: 160 mg/dL — AB (ref 65–99)

## 2015-06-08 ENCOUNTER — Encounter (HOSPITAL_COMMUNITY)
Admission: RE | Admit: 2015-06-08 | Discharge: 2015-06-08 | Disposition: A | Discharge status: Home / Self Care | Payer: PPO | Source: Ambulatory Visit | Attending: Cardiology | Admitting: Cardiology

## 2015-06-08 DIAGNOSIS — Z951 Presence of aortocoronary bypass graft: Secondary | ICD-10-CM | POA: Diagnosis not present

## 2015-06-08 LAB — GLUCOSE, CAPILLARY
Glucose-Capillary: 191 mg/dL — ABNORMAL HIGH (ref 65–99)
Glucose-Capillary: 154 mg/dL — ABNORMAL HIGH (ref 65–99)

## 2015-06-08 NOTE — Progress Notes (Signed)
Pt appeared to have more staggering gait with leaning toward left more than usual. Pt denies pain or dizziness, however pt states he does his symptoms are related to "inner ear".  Pt reports symptoms have been progressive over past 2-3 days, pt acknowledges symptoms are worse with walking than other activities. Pt able to exercise on seated equipment without difficulty.  Pt has equal arm and leg strength, denies speech, swallowing difficulties or visual disturbances other than his known cataract.  appt made with pt PCP  for evaluation Monday 06/13/15 @9 :30am.  Pt was offered appt today however he declined due to scheduled mountain trip today. Pt advised to present to ED for sudden severe or worsening symptoms. Pt verbalized understanding.

## 2015-06-10 ENCOUNTER — Encounter (HOSPITAL_COMMUNITY): Payer: PPO

## 2015-06-13 ENCOUNTER — Encounter (HOSPITAL_COMMUNITY)
Admission: RE | Admit: 2015-06-13 | Discharge: 2015-06-13 | Disposition: A | Discharge status: Home / Self Care | Payer: PPO | Source: Ambulatory Visit | Attending: Cardiology | Admitting: Cardiology

## 2015-06-13 ENCOUNTER — Encounter: Payer: Self-pay | Admitting: Family Medicine

## 2015-06-13 ENCOUNTER — Ambulatory Visit (INDEPENDENT_AMBULATORY_CARE_PROVIDER_SITE_OTHER): Payer: PPO | Admitting: Family Medicine

## 2015-06-13 VITALS — BP 132/82 | HR 82 | Ht 65.5 in | Wt 161.1 lb

## 2015-06-13 DIAGNOSIS — R42 Dizziness and giddiness: Secondary | ICD-10-CM | POA: Diagnosis not present

## 2015-06-13 DIAGNOSIS — E1122 Type 2 diabetes mellitus with diabetic chronic kidney disease: Secondary | ICD-10-CM | POA: Diagnosis not present

## 2015-06-13 DIAGNOSIS — N183 Chronic kidney disease, stage 3 (moderate): Secondary | ICD-10-CM

## 2015-06-13 DIAGNOSIS — Z951 Presence of aortocoronary bypass graft: Secondary | ICD-10-CM | POA: Diagnosis not present

## 2015-06-13 LAB — POCT GLYCOSYLATED HEMOGLOBIN (HGB A1C): Hemoglobin A1C: 6.5

## 2015-06-13 NOTE — Progress Notes (Signed)
Subjective:    Patient ID: Miguel Vazquez, male    DOB: Oct 11, 1940, 75 y.o.   MRN: [id number]  HPI  Patient undergoing cardiac rehabilitation following recent ST elevation MI.  Apparently, someone noticed that he seemed to be somewhat "off balance ". He notices this intermittently.   He has occasional sensation of dysequilibrium but no severe vertigo. No recent hearing changes. No chest pains. Symptoms are usually mild. No focal weakness. No speech changes. Denies any ataxia. No recent falls. No head injury. Denies headaches.   No cognitive changes.   Type 2 diabetes which is very well-controlled on metformin. No hypoglycemia.  Past Medical History  Diagnosis Date  . Hypertension   . Hyperlipidemia   . Diabetes mellitus   . Arthritis   . Hx of abdominal aortic aneurysm 1998  . Personal history of colonic polyps - adenomas 09/09/2013  . Cataract   . GERD (gastroesophageal reflux disease)    Past Surgical History  Procedure Laterality Date  . Abdominal aortic aneurysm repair  1998  . Umbilical hernia repair  1965  . Colonoscopy    . Cardiac catheterization N/A 03/18/2015    Procedure: Left Heart Cath and Coronary Angiography;  Surgeon: Peter M Martinique, MD;  Location: [facility];  Service: Cardiovascular;  Laterality: N/A;  . Coronary artery bypass graft N/A 03/18/2015    Procedure: CORONARY ARTERY BYPASS GRAFTING (CABG) x  three, using left internal mammary artery and right leg greater saphenous vein harvested endoscopically;  Surgeon: Melrose Nakayama, MD;  Location: [facility];  Service: Open Heart Surgery;  Laterality: N/A;  . Intraoperative transesophageal echocardiogram N/A 03/18/2015    Procedure: INTRAOPERATIVE TRANSESOPHAGEAL ECHOCARDIOGRAM;  Surgeon: Melrose Nakayama, MD;  Location: [facility];  Service: Open Heart Surgery;  Laterality: N/A;    reports that he quit smoking about 28 years ago. His smoking use included Cigarettes. He has a 30 pack-year smoking history.  He has never used smokeless tobacco. He reports that he drinks about 4.2 oz of alcohol per week. He reports that he does not use illicit drugs. family history includes Diabetes in his paternal aunt. There is no history of Colon cancer. Allergies  Allergen Reactions  . Ace Inhibitors Other (See Comments)    Hyperkaylemia      Review of Systems  Constitutional: Negative for fever, chills and fatigue.  Eyes: Negative for visual disturbance.  Respiratory: Negative for cough, chest tightness and shortness of breath.   Cardiovascular: Negative for chest pain, palpitations and leg swelling.  Gastrointestinal: Negative for abdominal pain.  Genitourinary: Negative for dysuria.  Neurological: Negative for seizures, syncope, weakness, light-headedness and headaches.  Psychiatric/Behavioral: Negative for confusion.       Objective:   Physical Exam  Constitutional: He is oriented to person, place, and time. He appears well-developed and well-nourished.  HENT:  Right Ear: External ear normal.  Left Ear: External ear normal.  Mouth/Throat: Oropharynx is clear and moist.  Eyes: Pupils are equal, round, and reactive to light.  Neck: Neck supple. No thyromegaly present.  Cardiovascular: Normal rate and regular rhythm.   Pulmonary/Chest: Effort normal and breath sounds normal. No respiratory distress. He has no wheezes. He has no rales.  Musculoskeletal: He exhibits no edema.  Neurological: He is alert and oriented to person, place, and time. No cranial nerve deficit. Coordination normal.  No focal weakness.  Gait normal.   Normal finger to nose testing.  Psychiatric: He has a normal mood and affect. His behavior  is normal.          Assessment & Plan:   Disequilibrium. Non-focal exam at this point. Does not sound like true vertigo. He's able to ambulate at this point with no difficulties whatsoever. Romberg negative. No signs of neuropathy. We've recommended observation. Consider vestibular  rehabilitation or neuro rehabilitation if symptoms persist  Type 2 diabetes. History of good control. Recheck A1c A1C remains excellent at 6.5%.

## 2015-06-13 NOTE — Progress Notes (Signed)
Pre visit review using our clinic review tool, if applicable. No additional management support is needed unless otherwise documented below in the visit note. 

## 2015-06-13 NOTE — Patient Instructions (Signed)
Fall Prevention in the Home  Falls can cause injuries and can affect people from all age groups. There are many simple things that you can do to make your home safe and to help prevent falls. WHAT CAN I DO ON THE OUTSIDE OF MY HOME?  Regularly repair the edges of walkways and driveways and fix any cracks.  Remove high doorway thresholds.  Trim any shrubbery on the main path into your home.  Use bright outdoor lighting.  Clear walkways of debris and clutter, including tools and rocks.  Regularly check that handrails are securely fastened and in good repair. Both sides of any steps should have handrails.  Install guardrails along the edges of any raised decks or porches.  Have leaves, snow, and ice cleared regularly.  Use sand or salt on walkways during winter months.  In the garage, clean up any spills right away, including grease or oil spills. WHAT CAN I DO IN THE BATHROOM?  Use night lights.  Install grab bars by the toilet and in the tub and shower. Do not use towel bars as grab bars.  Use non-skid mats or decals on the floor of the tub or shower.  If you need to sit down while you are in the shower, use a plastic, non-slip stool..  Keep the floor dry. Immediately clean up any water that spills on the floor.  Remove soap buildup in the tub or shower on a regular basis.  Attach bath mats securely with double-sided non-slip rug tape.  Remove throw rugs and other tripping hazards from the floor. WHAT CAN I DO IN THE BEDROOM?  Use night lights.  Make sure that a bedside light is easy to reach.  Do not use oversized bedding that drapes onto the floor.  Have a firm chair that has side arms to use for getting dressed.  Remove throw rugs and other tripping hazards from the floor. WHAT CAN I DO IN THE KITCHEN?   Clean up any spills right away.  Avoid walking on wet floors.  Place frequently used items in easy-to-reach places.  If you need to reach for something  above you, use a sturdy step stool that has a grab bar.  Keep electrical cables out of the way.  Do not use floor polish or wax that makes floors slippery. If you have to use wax, make sure that it is non-skid floor wax.  Remove throw rugs and other tripping hazards from the floor. WHAT CAN I DO IN THE STAIRWAYS?  Do not leave any items on the stairs.  Make sure that there are handrails on both sides of the stairs. Fix handrails that are broken or loose. Make sure that handrails are as long as the stairways.  Check any carpeting to make sure that it is firmly attached to the stairs. Fix any carpet that is loose or worn.  Avoid having throw rugs at the top or bottom of stairways, or secure the rugs with carpet tape to prevent them from moving.  Make sure that you have a light switch at the top of the stairs and the bottom of the stairs. If you do not have them, have them installed. WHAT ARE SOME OTHER FALL PREVENTION TIPS?  Wear closed-toe shoes that fit well and support your feet. Wear shoes that have rubber soles or low heels.  When you use a stepladder, make sure that it is completely opened and that the sides are firmly locked. Have someone hold the ladder while you   are using it. Do not climb a closed stepladder.  Add color or contrast paint or tape to grab bars and handrails in your home. Place contrasting color strips on the first and last steps.  Use mobility aids as needed, such as canes, walkers, scooters, and crutches.  Turn on lights if it is dark. Replace any light bulbs that burn out.  Set up furniture so that there are clear paths. Keep the furniture in the same spot.  Fix any uneven floor surfaces.  Choose a carpet design that does not hide the edge of steps of a stairway.  Be aware of any and all pets.  Review your medicines with your healthcare provider. Some medicines can cause dizziness or changes in blood pressure, which increase your risk of falling. Talk  with your health care provider about other ways that you can decrease your risk of falls. This may include working with a physical therapist or trainer to improve your strength, balance, and endurance.   This information is not intended to replace advice given to you by your health care provider. Make sure you discuss any questions you have with your health care provider.   Document Released: 03/09/2002 Document Revised: 08/03/2014 Document Reviewed: 04/23/2014 Elsevier Interactive Patient Education 2016 Elsevier Inc.  

## 2015-06-15 ENCOUNTER — Encounter (HOSPITAL_COMMUNITY)
Admission: RE | Admit: 2015-06-15 | Discharge: 2015-06-15 | Disposition: A | Discharge status: Home / Self Care | Payer: PPO | Source: Ambulatory Visit | Attending: Cardiology | Admitting: Cardiology

## 2015-06-15 DIAGNOSIS — Z951 Presence of aortocoronary bypass graft: Secondary | ICD-10-CM | POA: Diagnosis not present

## 2015-06-17 ENCOUNTER — Encounter (HOSPITAL_COMMUNITY)
Admission: RE | Admit: 2015-06-17 | Discharge: 2015-06-17 | Disposition: A | Discharge status: Home / Self Care | Payer: PPO | Source: Ambulatory Visit | Attending: Cardiology | Admitting: Cardiology

## 2015-06-17 DIAGNOSIS — Z951 Presence of aortocoronary bypass graft: Secondary | ICD-10-CM | POA: Diagnosis not present

## 2015-06-20 ENCOUNTER — Encounter (HOSPITAL_COMMUNITY)
Admission: RE | Admit: 2015-06-20 | Discharge: 2015-06-20 | Disposition: A | Discharge status: Home / Self Care | Payer: PPO | Source: Ambulatory Visit | Attending: Cardiology | Admitting: Cardiology

## 2015-06-20 DIAGNOSIS — Z951 Presence of aortocoronary bypass graft: Secondary | ICD-10-CM | POA: Diagnosis not present

## 2015-06-22 ENCOUNTER — Encounter (HOSPITAL_COMMUNITY)
Admission: RE | Admit: 2015-06-22 | Discharge: 2015-06-22 | Disposition: A | Discharge status: Home / Self Care | Payer: PPO | Source: Ambulatory Visit | Attending: Cardiology | Admitting: Cardiology

## 2015-06-22 DIAGNOSIS — Z951 Presence of aortocoronary bypass graft: Secondary | ICD-10-CM | POA: Diagnosis not present

## 2015-06-24 ENCOUNTER — Encounter (HOSPITAL_COMMUNITY)
Admission: RE | Admit: 2015-06-24 | Discharge: 2015-06-24 | Disposition: A | Discharge status: Home / Self Care | Payer: PPO | Source: Ambulatory Visit | Attending: Cardiology | Admitting: Cardiology

## 2015-06-24 DIAGNOSIS — Z951 Presence of aortocoronary bypass graft: Secondary | ICD-10-CM | POA: Diagnosis not present

## 2015-06-27 ENCOUNTER — Encounter (HOSPITAL_COMMUNITY)
Admission: RE | Admit: 2015-06-27 | Discharge: 2015-06-27 | Disposition: A | Discharge status: Home / Self Care | Payer: PPO | Source: Ambulatory Visit | Attending: Cardiology | Admitting: Cardiology

## 2015-06-27 DIAGNOSIS — Z951 Presence of aortocoronary bypass graft: Secondary | ICD-10-CM | POA: Diagnosis not present

## 2015-06-29 ENCOUNTER — Encounter (HOSPITAL_COMMUNITY)
Admission: RE | Admit: 2015-06-29 | Discharge: 2015-06-29 | Disposition: A | Discharge status: Home / Self Care | Payer: PPO | Source: Ambulatory Visit | Attending: Cardiology | Admitting: Cardiology

## 2015-06-29 DIAGNOSIS — Z951 Presence of aortocoronary bypass graft: Secondary | ICD-10-CM | POA: Diagnosis not present

## 2015-07-01 ENCOUNTER — Ambulatory Visit (INDEPENDENT_AMBULATORY_CARE_PROVIDER_SITE_OTHER): Payer: PPO | Admitting: Cardiology

## 2015-07-01 ENCOUNTER — Encounter: Payer: Self-pay | Admitting: Cardiology

## 2015-07-01 ENCOUNTER — Encounter (HOSPITAL_COMMUNITY): Payer: PPO

## 2015-07-01 VITALS — BP 140/80 | HR 70 | Ht 65.0 in | Wt 159.6 lb

## 2015-07-01 DIAGNOSIS — I10 Essential (primary) hypertension: Secondary | ICD-10-CM

## 2015-07-01 DIAGNOSIS — N183 Chronic kidney disease, stage 3 unspecified: Secondary | ICD-10-CM

## 2015-07-01 DIAGNOSIS — E1122 Type 2 diabetes mellitus with diabetic chronic kidney disease: Secondary | ICD-10-CM

## 2015-07-01 DIAGNOSIS — Z951 Presence of aortocoronary bypass graft: Secondary | ICD-10-CM

## 2015-07-01 DIAGNOSIS — E785 Hyperlipidemia, unspecified: Secondary | ICD-10-CM

## 2015-07-01 DIAGNOSIS — I255 Ischemic cardiomyopathy: Secondary | ICD-10-CM | POA: Diagnosis not present

## 2015-07-01 MED ORDER — LISINOPRIL 2.5 MG PO TABS: 2.5000 mg | ORAL_TABLET | Freq: Every day | ORAL | Status: DC

## 2015-07-01 MED ORDER — CARVEDILOL 6.25 MG PO TABS: 6.2500 mg | ORAL_TABLET | Freq: Two times a day (BID) | ORAL | Status: DC

## 2015-07-01 NOTE — Patient Instructions (Signed)
Increase carvedilol to 6.25 mg twice a day  Start lisinopril to 2.5 mg daily- we will check lab work in 2 weeks.  Continue your other therapy  I will see you in 2 months.

## 2015-07-02 NOTE — Progress Notes (Signed)
07/02/2015 Miguel Vazquez   Dec 03, 1940  [id number]  Primary Physician Eulas Post, MD Primary Cardiologist: Dr Denis Koppel Martinique  HPI:  75 y/o male with DM, HTN, CRI, and PVD s/p remote AAA R&G, admitted 03/18/15 with a STEMI. At cath he had severe 3V and left main CAD and he went for urgent CABG. He tolerated this well. Post op he had PAF and was placed on Amiodarone. Amiodarone has since been discontinued. Echo in January showed EF 35-40%. On his last office visit he was started on lisinopril but this was discontinued when his potassium went up to 5.8.  On follow up today he is doing well. No chest pain or SOB. No palpitations. Vitals at Cardiac Rehab have been normal. He does complain of neck pain in the posterior cervical region. His wife states his posture is more stooped.    Current Outpatient Prescriptions  Medication Sig Dispense Refill  . aspirin EC 325 MG EC tablet Take 1 tablet (325 mg total) by mouth daily. 30 tablet 0  . atorvastatin (LIPITOR) 40 MG tablet TAKE 1 TABLET (40 MG TOTAL) BY MOUTH DAILY. 90 tablet 1  . beta carotene w/minerals (OCUVITE) tablet Take 1 tablet by mouth daily.    Marland Kitchen glucose blood (BAYER CONTOUR NEXT TEST) test strip Test blood sugars as needed 100 each 4  . KRILL OIL PO Take 1 capsule by mouth daily.     . metFORMIN (GLUCOPHAGE) 500 MG tablet Take 500 mg by mouth daily with breakfast. Takes 1 tablet at night.    . Multiple Vitamin (MULTIVITAMIN) tablet Take 1 tablet by mouth daily. Centrium Silver once daily     . omeprazole (PRILOSEC) 40 MG capsule TAKE 1 CAPSULE (40 MG TOTAL) BY MOUTH DAILY BEFORE BREAKFAST. 30 capsule 2  . vitamin E 400 UNIT capsule Take 400 Units by mouth daily.    . carvedilol (COREG) 6.25 MG tablet Take 1 tablet (6.25 mg total) by mouth 2 (two) times daily. 180 tablet 3  . lisinopril (PRINIVIL,ZESTRIL) 2.5 MG tablet Take 1 tablet (2.5 mg total) by mouth daily. 90 tablet 3   No current facility-administered medications for this  visit.    Allergies  Allergen Reactions  . Ace Inhibitors Other (See Comments)    Hyperkaylemia    Social History   Social History  . Marital Status: Married    Spouse Name: [name]  . Number of Children: 3  . Years of Education: N/A   Occupational History  . Not on file.   Social History Main Topics  . Smoking status: Former Smoker -- 1.50 packs/day for 20 years    Types: Cigarettes    Quit date: 08/24/1986  . Smokeless tobacco: Never Used  . Alcohol Use: 4.2 oz/week    7 Glasses of wine per week  . Drug Use: No  . Sexual Activity: Not on file   Other Topics Concern  . Not on file   Social History Narrative     Review of Systems: As noted in HPI All other systems reviewed and are otherwise negative except as noted above.    Blood pressure 140/80, pulse 70, height 5\' 5"  (1.651 m), weight 72.394 kg (159 lb 9.6 oz).  GENERAL:  Well appearing WM in NAD HEENT:  PERRL, EOMI, sclera are clear. Oropharynx is clear. NECK:  No jugular venous distention, carotid upstroke brisk and symmetric, no bruits, no thyromegaly or adenopathy LUNGS:  Clear to auscultation bilaterally CHEST:  Well healed sternotomy scar.  HEART:  RRR,  PMI not displaced or sustained,S1 and S2 within normal limits, no S3, no S4: no clicks, no rubs, no murmurs ABD:  Soft, nontender. BS +, no masses or bruits. No hepatomegaly, no splenomegaly EXT:  2 + pulses throughout, no edema, no cyanosis no clubbing SKIN:  Warm and dry.  No rashes NEURO:  Alert and oriented x 3. Cranial nerves II through XII intact. PSYCH:  Cognitively intact    EKG not done today.  ASSESSMENT AND PLAN:  1. CAD s/p STEMI in December 2016. Urgent CABG for severe 3 vessel and left main disease. He is asymptomatic and has made a good recovery. No restrictions at this time. Continue ASA, beta blocker, and statin.  2. Ischemic cardiomyopathy. EF 35-40%. Well compensated. Will increase Coreg to 6.25 mg bid. Resume lisinopril at low  dose of 2.5 mg daily. Repeat chemistries in 2 weeks. Titrate medications as tolerated. Repeat Echo later in year.   3. Hyperlipidemia. On lipitor. Check fasting lipids in 2 weeks.  4. Post op Afib. Resolved.  5. DM type 2 with renal manifestations.   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 S/p AAA repair.     Meiya Wisler Martinique MD,FACC  07/02/2015 9:40 AM

## 2015-07-04 ENCOUNTER — Encounter (HOSPITAL_COMMUNITY)
Admission: RE | Admit: 2015-07-04 | Discharge: 2015-07-04 | Disposition: A | Discharge status: Home / Self Care | Payer: PPO | Source: Ambulatory Visit | Attending: Cardiology | Admitting: Cardiology

## 2015-07-04 DIAGNOSIS — Z951 Presence of aortocoronary bypass graft: Secondary | ICD-10-CM | POA: Insufficient documentation

## 2015-07-04 DIAGNOSIS — I2111 ST elevation (STEMI) myocardial infarction involving right coronary artery: Secondary | ICD-10-CM | POA: Insufficient documentation

## 2015-07-06 ENCOUNTER — Encounter (HOSPITAL_COMMUNITY)
Admission: RE | Admit: 2015-07-06 | Discharge: 2015-07-06 | Disposition: A | Discharge status: Home / Self Care | Payer: PPO | Source: Ambulatory Visit | Attending: Cardiology | Admitting: Cardiology

## 2015-07-06 DIAGNOSIS — Z951 Presence of aortocoronary bypass graft: Secondary | ICD-10-CM | POA: Diagnosis not present

## 2015-07-08 ENCOUNTER — Encounter (HOSPITAL_COMMUNITY)
Admission: RE | Admit: 2015-07-08 | Discharge: 2015-07-08 | Disposition: A | Discharge status: Home / Self Care | Payer: PPO | Source: Ambulatory Visit | Attending: Cardiology | Admitting: Cardiology

## 2015-07-08 DIAGNOSIS — Z951 Presence of aortocoronary bypass graft: Secondary | ICD-10-CM | POA: Diagnosis not present

## 2015-07-11 ENCOUNTER — Encounter (HOSPITAL_COMMUNITY)
Admission: RE | Admit: 2015-07-11 | Discharge: 2015-07-11 | Disposition: A | Discharge status: Home / Self Care | Payer: PPO | Source: Ambulatory Visit | Attending: Cardiology | Admitting: Cardiology

## 2015-07-11 DIAGNOSIS — Z951 Presence of aortocoronary bypass graft: Secondary | ICD-10-CM | POA: Diagnosis not present

## 2015-07-13 ENCOUNTER — Encounter (HOSPITAL_COMMUNITY)
Admission: RE | Admit: 2015-07-13 | Discharge: 2015-07-13 | Disposition: A | Discharge status: Home / Self Care | Payer: PPO | Source: Ambulatory Visit | Attending: Cardiology | Admitting: Cardiology

## 2015-07-13 ENCOUNTER — Other Ambulatory Visit: Payer: Self-pay | Admitting: Internal Medicine

## 2015-07-13 DIAGNOSIS — Z951 Presence of aortocoronary bypass graft: Secondary | ICD-10-CM | POA: Diagnosis not present

## 2015-07-13 NOTE — Telephone Encounter (Signed)
With all the recent cardiac issues he has had wanted to ask before refilling Sir, thank you.

## 2015-07-13 NOTE — Telephone Encounter (Signed)
Left message for him to call me back.  

## 2015-07-13 NOTE — Telephone Encounter (Signed)
His reflux sxs could have been cardiac.Is he having any of those sxs since his CABG?  If not I would reduce omeprazole to 20 mg daily and refill x 1 year to reduce risk of ulcers since he takes a daily 325 mg ASA  If still having sxs refill the 40 x 1 year and ask him to see me

## 2015-07-14 MED ORDER — OMEPRAZOLE 20 MG PO CPDR: 20.0000 mg | DELAYED_RELEASE_CAPSULE | Freq: Every day | ORAL | Status: DC

## 2015-07-14 NOTE — Telephone Encounter (Signed)
Spoke with Miguel Vazquez his wife and she said yes his sxs are gone.  Therefore I told her what Dr Carlean Purl advised and sent in the new rx.

## 2015-07-15 ENCOUNTER — Encounter (HOSPITAL_COMMUNITY)
Admission: RE | Admit: 2015-07-15 | Discharge: 2015-07-15 | Disposition: A | Discharge status: Home / Self Care | Payer: PPO | Source: Ambulatory Visit | Attending: Cardiology | Admitting: Cardiology

## 2015-07-15 DIAGNOSIS — I2589 Other forms of chronic ischemic heart disease: Secondary | ICD-10-CM | POA: Diagnosis not present

## 2015-07-15 DIAGNOSIS — E1122 Type 2 diabetes mellitus with diabetic chronic kidney disease: Secondary | ICD-10-CM | POA: Diagnosis not present

## 2015-07-15 DIAGNOSIS — I10 Essential (primary) hypertension: Secondary | ICD-10-CM | POA: Diagnosis not present

## 2015-07-15 DIAGNOSIS — I255 Ischemic cardiomyopathy: Secondary | ICD-10-CM | POA: Diagnosis not present

## 2015-07-15 DIAGNOSIS — E785 Hyperlipidemia, unspecified: Secondary | ICD-10-CM | POA: Diagnosis not present

## 2015-07-15 DIAGNOSIS — E1129 Type 2 diabetes mellitus with other diabetic kidney complication: Secondary | ICD-10-CM | POA: Diagnosis not present

## 2015-07-15 DIAGNOSIS — Z951 Presence of aortocoronary bypass graft: Secondary | ICD-10-CM | POA: Diagnosis not present

## 2015-07-15 DIAGNOSIS — N183 Chronic kidney disease, stage 3 (moderate): Secondary | ICD-10-CM | POA: Diagnosis not present

## 2015-07-15 LAB — BASIC METABOLIC PANEL
BUN: 16 mg/dL (ref 7–25)
CHLORIDE: 104 mmol/L (ref 98–110)
CO2: 29 mmol/L (ref 20–31)
Calcium: 9.5 mg/dL (ref 8.6–10.3)
Creat: 0.9 mg/dL (ref 0.70–1.18)
GLUCOSE: 141 mg/dL — AB (ref 65–99)
POTASSIUM: 4.8 mmol/L (ref 3.5–5.3)
SODIUM: 140 mmol/L (ref 135–146)

## 2015-07-15 LAB — HEPATIC FUNCTION PANEL
ALBUMIN: 3.9 g/dL (ref 3.6–5.1)
ALT: 19 U/L (ref 9–46)
AST: 16 U/L (ref 10–35)
Alkaline Phosphatase: 77 U/L (ref 40–115)
BILIRUBIN DIRECT: 0.1 mg/dL (ref <=0.2)
BILIRUBIN TOTAL: 0.7 mg/dL (ref 0.2–1.2)
Indirect Bilirubin: 0.6 mg/dL (ref 0.2–1.2)
Total Protein: 6.5 g/dL (ref 6.1–8.1)

## 2015-07-15 LAB — LIPID PANEL
CHOL/HDL RATIO: 3.3 ratio (ref <=5.0)
Cholesterol: 141 mg/dL (ref 125–200)
HDL: 43 mg/dL (ref >=40)
LDL CALC: 75 mg/dL (ref <130)
Triglycerides: 113 mg/dL (ref <150)
VLDL: 23 mg/dL (ref <30)

## 2015-07-15 LAB — GLUCOSE, CAPILLARY: GLUCOSE-CAPILLARY: 182 mg/dL — AB (ref 65–99)

## 2015-07-18 ENCOUNTER — Encounter (HOSPITAL_COMMUNITY)
Admission: RE | Admit: 2015-07-18 | Discharge: 2015-07-18 | Disposition: A | Discharge status: Home / Self Care | Payer: PPO | Source: Ambulatory Visit | Attending: Cardiology | Admitting: Cardiology

## 2015-07-18 DIAGNOSIS — Z951 Presence of aortocoronary bypass graft: Secondary | ICD-10-CM | POA: Diagnosis not present

## 2015-07-20 ENCOUNTER — Encounter (HOSPITAL_COMMUNITY): Payer: PPO

## 2015-07-22 ENCOUNTER — Encounter (HOSPITAL_COMMUNITY)
Admission: RE | Admit: 2015-07-22 | Discharge: 2015-07-22 | Disposition: A | Discharge status: Home / Self Care | Payer: PPO | Source: Ambulatory Visit | Attending: Cardiology | Admitting: Cardiology

## 2015-07-22 DIAGNOSIS — Z951 Presence of aortocoronary bypass graft: Secondary | ICD-10-CM | POA: Diagnosis not present

## 2015-07-25 ENCOUNTER — Encounter (HOSPITAL_COMMUNITY)
Admission: RE | Admit: 2015-07-25 | Discharge: 2015-07-25 | Disposition: A | Discharge status: Home / Self Care | Payer: PPO | Source: Ambulatory Visit | Attending: Cardiology | Admitting: Cardiology

## 2015-07-25 DIAGNOSIS — Z951 Presence of aortocoronary bypass graft: Secondary | ICD-10-CM | POA: Diagnosis not present

## 2015-07-27 ENCOUNTER — Encounter (HOSPITAL_COMMUNITY)
Admission: RE | Admit: 2015-07-27 | Discharge: 2015-07-27 | Disposition: A | Discharge status: Home / Self Care | Payer: PPO | Source: Ambulatory Visit | Attending: Cardiology | Admitting: Cardiology

## 2015-07-27 DIAGNOSIS — Z951 Presence of aortocoronary bypass graft: Secondary | ICD-10-CM | POA: Diagnosis not present

## 2015-07-29 ENCOUNTER — Encounter (HOSPITAL_COMMUNITY)
Admission: RE | Admit: 2015-07-29 | Discharge: 2015-07-29 | Disposition: A | Discharge status: Home / Self Care | Payer: PPO | Source: Ambulatory Visit | Attending: Cardiology | Admitting: Cardiology

## 2015-07-29 DIAGNOSIS — Z951 Presence of aortocoronary bypass graft: Secondary | ICD-10-CM | POA: Diagnosis not present

## 2015-08-01 ENCOUNTER — Encounter (HOSPITAL_COMMUNITY)
Admission: RE | Admit: 2015-08-01 | Discharge: 2015-08-01 | Disposition: A | Discharge status: Home / Self Care | Payer: PPO | Source: Ambulatory Visit | Attending: Cardiology | Admitting: Cardiology

## 2015-08-01 DIAGNOSIS — Z951 Presence of aortocoronary bypass graft: Secondary | ICD-10-CM | POA: Insufficient documentation

## 2015-08-01 DIAGNOSIS — I2111 ST elevation (STEMI) myocardial infarction involving right coronary artery: Secondary | ICD-10-CM | POA: Insufficient documentation

## 2015-08-03 ENCOUNTER — Encounter (HOSPITAL_COMMUNITY)
Admission: RE | Admit: 2015-08-03 | Discharge: 2015-08-03 | Disposition: A | Discharge status: Home / Self Care | Payer: PPO | Source: Ambulatory Visit | Attending: Cardiology | Admitting: Cardiology

## 2015-08-03 DIAGNOSIS — Z951 Presence of aortocoronary bypass graft: Secondary | ICD-10-CM | POA: Diagnosis not present

## 2015-08-03 NOTE — Progress Notes (Signed)
Pt graduated from cardiac rehab program today with completion of 36 exercise sessions in Phase II. Pt maintained good attendance to education and exercise class. Pt progressed nicely during his participation in rehab as evidenced by increased MET level. Pt MET level increased from 2.5 to 3.9.  Medication list reconciled. Repeat  PHQ score-0. Pt has strong faith and supportive family.  P  Pt has made significant lifestyle changes and should be commended for his success. Pt feels he has achieved his goals during cardiac rehab.  Pt has improved stamina and is able to do chores around the home with ease. Pt wife had recent fall and pt is now able to care for his wife and the household with no problems.  Pt plans to continue exercise walking in the park at [facility] three days  and on bad weather days plan to walk in the church all purpose room and exercise equipment. Advised to increase his exercise days to 6-7 days a week as his schedule would allow.  It was a true delight to have this patient participate in our cardiac rehab program! Maurice Small RN, BSN

## 2015-08-05 ENCOUNTER — Encounter (HOSPITAL_COMMUNITY): Admission: RE | Admit: 2015-08-05 | Payer: PPO | Source: Ambulatory Visit

## 2015-08-08 ENCOUNTER — Other Ambulatory Visit: Payer: Self-pay | Admitting: Internal Medicine

## 2015-09-02 ENCOUNTER — Ambulatory Visit (INDEPENDENT_AMBULATORY_CARE_PROVIDER_SITE_OTHER): Payer: PPO | Admitting: Cardiology

## 2015-09-02 ENCOUNTER — Encounter: Payer: Self-pay | Admitting: Cardiology

## 2015-09-02 VITALS — BP 134/84 | HR 66 | Ht 68.0 in | Wt 166.0 lb

## 2015-09-02 DIAGNOSIS — N183 Chronic kidney disease, stage 3 unspecified: Secondary | ICD-10-CM

## 2015-09-02 DIAGNOSIS — E1121 Type 2 diabetes mellitus with diabetic nephropathy: Secondary | ICD-10-CM

## 2015-09-02 DIAGNOSIS — I255 Ischemic cardiomyopathy: Secondary | ICD-10-CM | POA: Diagnosis not present

## 2015-09-02 DIAGNOSIS — Z9889 Other specified postprocedural states: Secondary | ICD-10-CM | POA: Diagnosis not present

## 2015-09-02 DIAGNOSIS — Z951 Presence of aortocoronary bypass graft: Secondary | ICD-10-CM | POA: Diagnosis not present

## 2015-09-02 MED ORDER — LISINOPRIL 5 MG PO TABS: 5.0000 mg | ORAL_TABLET | Freq: Every day | ORAL | Status: DC

## 2015-09-02 MED ORDER — ASPIRIN EC 81 MG PO TBEC: 81.0000 mg | DELAYED_RELEASE_TABLET | Freq: Every day | ORAL | Status: DC

## 2015-09-02 NOTE — Patient Instructions (Signed)
Increase lisinopril to 5 mg daily  Reduce ASA to 81 mg daily  We will check your potassium in a couple of weeks  We will see you in 4 months with repeat Echo.

## 2015-09-02 NOTE — Progress Notes (Signed)
09/02/2015 Miguel Vazquez   May 29, 1940  [id number]  Primary Physician Eulas Post, MD Primary Cardiologist: Dr Peter Martinique  HPI:  75 y/o male with DM, HTN, CRI, and PVD s/p remote AAA R&G, admitted 03/18/15 with a STEMI. At cath he had severe 3V and left main CAD. EF 20--25%. He went for urgent CABG. He tolerated this well. Post op he had PAF and was placed on Amiodarone. Amiodarone has since been discontinued. Echo in January showed EF 35-40%. He was placed on lisinopril but developed hyperkalemia so this was stopped. On his last visit we resumed at 2.5 mg daily and potassium levels have been normal.  On follow up today he is doing well. No chest pain or SOB. No palpitations. He is exercising regularly. Feels very well.    Current Outpatient Prescriptions  Medication Sig Dispense Refill  . atorvastatin (LIPITOR) 40 MG tablet TAKE 1 TABLET (40 MG TOTAL) BY MOUTH DAILY. 90 tablet 1  . beta carotene w/minerals (OCUVITE) tablet Take 1 tablet by mouth daily.    . carvedilol (COREG) 6.25 MG tablet Take 1 tablet (6.25 mg total) by mouth 2 (two) times daily. 180 tablet 3  . glucose blood (BAYER CONTOUR NEXT TEST) test strip Test blood sugars as needed 100 each 4  . KRILL OIL PO Take 1 capsule by mouth daily.     . metFORMIN (GLUCOPHAGE) 500 MG tablet Take 500 mg by mouth daily with breakfast. Takes 1 tablet at night.    . Multiple Vitamin (MULTIVITAMIN) tablet Take 1 tablet by mouth daily. Centrium Silver once daily     . omeprazole (PRILOSEC) 20 MG capsule Take 1 capsule (20 mg total) by mouth daily. 90 capsule 3  . vitamin E 400 UNIT capsule Take 400 Units by mouth daily.    Marland Kitchen aspirin EC 81 MG tablet Take 1 tablet (81 mg total) by mouth daily.    Marland Kitchen lisinopril (PRINIVIL,ZESTRIL) 5 MG tablet Take 1 tablet (5 mg total) by mouth daily. 90 tablet 3   No current facility-administered medications for this visit.    Allergies  Allergen Reactions  . Ace Inhibitors Other (See Comments)   Hyperkaylemia    Social History   Social History  . Marital Status: Married    Spouse Name: [name]  . Number of Children: 3  . Years of Education: N/A   Occupational History  . Not on file.   Social History Main Topics  . Smoking status: Former Smoker -- 1.50 packs/day for 20 years    Types: Cigarettes    Quit date: 08/24/1986  . Smokeless tobacco: Never Used  . Alcohol Use: 4.2 oz/week    7 Glasses of wine per week  . Drug Use: No  . Sexual Activity: Not on file   Other Topics Concern  . Not on file   Social History Narrative     Review of Systems: As noted in HPI All other systems reviewed and are otherwise negative except as noted above.    Blood pressure 134/84, pulse 66, height 5\' 8"  (1.727 m), weight 75.297 kg (166 lb).  GENERAL:  Well appearing WM in NAD HEENT:  PERRL, EOMI, sclera are clear. Oropharynx is clear. NECK:  No jugular venous distention, carotid upstroke brisk and symmetric, no bruits, no thyromegaly or adenopathy LUNGS:  Clear to auscultation bilaterally CHEST:  Well healed sternotomy scar.  HEART:  RRR,  PMI not displaced or sustained,S1 and S2 within normal limits, no S3, no S4: no clicks,  no rubs, no murmurs ABD:  Soft, nontender. BS +, no masses or bruits. No hepatomegaly, no splenomegaly EXT:  2 + pulses throughout, no edema, no cyanosis no clubbing SKIN:  Warm and dry.  No rashes NEURO:  Alert and oriented x 3. Cranial nerves II through XII intact. PSYCH:  Cognitively intact    EKG not done today.  Lab Results  Component Value Date   WBC 8.8 04/06/2015   HGB 11.7* 04/06/2015   HCT 36.5* 04/06/2015   PLT 309 04/06/2015   GLUCOSE 141* 07/15/2015   CHOL 141 07/15/2015   TRIG 113 07/15/2015   HDL 43 07/15/2015   LDLDIRECT 84.1 05/21/2013   LDLCALC 75 07/15/2015   ALT 19 07/15/2015   AST 16 07/15/2015   NA 140 07/15/2015   K 4.8 07/15/2015   CL 104 07/15/2015   CREATININE 0.90 07/15/2015   BUN 16 07/15/2015   CO2 29  07/15/2015   INR 1.63* 03/18/2015   HGBA1C 6.5 06/13/2015   MICROALBUR 6.8* 11/21/2009     ASSESSMENT AND PLAN:  1. CAD s/p STEMI in December 2016. Urgent CABG for severe 3 vessel and left main disease. He is asymptomatic.Marland Kitchen No restrictions at this time. Continue ASA- reduce to 81 mg daily, beta blocker, and statin.  2. Ischemic cardiomyopathy. EF 35-40%. Well compensated. Will continue Coreg  6.25 mg bid. increase lisinopril to 5 mg daily. Repeat chemistries in 2 weeks. Titrate medications as tolerated. Repeat Echo in 4 months at next OV.   3. Hyperlipidemia. On lipitor.   4. Post op Afib. Resolved.  5. DM type 2 with renal manifestations.   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 S/p AAA repair.     Peter Martinique MD,FACC  09/02/2015 5:26 PM

## 2015-09-16 DIAGNOSIS — I255 Ischemic cardiomyopathy: Secondary | ICD-10-CM | POA: Diagnosis not present

## 2015-09-16 DIAGNOSIS — Z9889 Other specified postprocedural states: Secondary | ICD-10-CM | POA: Diagnosis not present

## 2015-09-16 DIAGNOSIS — I2589 Other forms of chronic ischemic heart disease: Secondary | ICD-10-CM | POA: Diagnosis not present

## 2015-09-16 DIAGNOSIS — E1129 Type 2 diabetes mellitus with other diabetic kidney complication: Secondary | ICD-10-CM | POA: Diagnosis not present

## 2015-09-16 DIAGNOSIS — Z951 Presence of aortocoronary bypass graft: Secondary | ICD-10-CM | POA: Diagnosis not present

## 2015-09-16 DIAGNOSIS — N183 Chronic kidney disease, stage 3 (moderate): Secondary | ICD-10-CM | POA: Diagnosis not present

## 2015-09-16 DIAGNOSIS — E1121 Type 2 diabetes mellitus with diabetic nephropathy: Secondary | ICD-10-CM | POA: Diagnosis not present

## 2015-09-16 LAB — BASIC METABOLIC PANEL
BUN: 21 mg/dL (ref 7–25)
CALCIUM: 9.4 mg/dL (ref 8.6–10.3)
CHLORIDE: 103 mmol/L (ref 98–110)
CO2: 24 mmol/L (ref 20–31)
CREATININE: 0.97 mg/dL (ref 0.70–1.18)
GLUCOSE: 149 mg/dL — AB (ref 65–99)
Potassium: 4.8 mmol/L (ref 3.5–5.3)
Sodium: 138 mmol/L (ref 135–146)

## 2015-09-21 ENCOUNTER — Telehealth: Payer: Self-pay | Admitting: Cardiology

## 2015-09-21 NOTE — Telephone Encounter (Signed)
Called patient and left a voicemail to call back and schedule his echo before 12-22-15 visit with Dr. Martinique.

## 2015-09-28 ENCOUNTER — Other Ambulatory Visit: Payer: Self-pay | Admitting: Family Medicine

## 2015-11-01 ENCOUNTER — Other Ambulatory Visit: Payer: Self-pay | Admitting: Internal Medicine

## 2015-11-03 DIAGNOSIS — H43823 Vitreomacular adhesion, bilateral: Secondary | ICD-10-CM | POA: Diagnosis not present

## 2015-11-03 DIAGNOSIS — H524 Presbyopia: Secondary | ICD-10-CM | POA: Diagnosis not present

## 2015-11-03 DIAGNOSIS — H35363 Drusen (degenerative) of macula, bilateral: Secondary | ICD-10-CM | POA: Diagnosis not present

## 2015-11-03 DIAGNOSIS — H25813 Combined forms of age-related cataract, bilateral: Secondary | ICD-10-CM | POA: Diagnosis not present

## 2015-11-03 DIAGNOSIS — E119 Type 2 diabetes mellitus without complications: Secondary | ICD-10-CM | POA: Diagnosis not present

## 2015-11-10 ENCOUNTER — Other Ambulatory Visit: Payer: Self-pay | Admitting: Family Medicine

## 2015-11-10 NOTE — Telephone Encounter (Signed)
Rx refill sent to pharmacy. 

## 2015-11-11 ENCOUNTER — Other Ambulatory Visit: Payer: Self-pay | Admitting: Family Medicine

## 2015-12-13 ENCOUNTER — Ambulatory Visit (HOSPITAL_COMMUNITY): Payer: PPO | Attending: Cardiology

## 2015-12-13 ENCOUNTER — Other Ambulatory Visit: Payer: Self-pay

## 2015-12-13 DIAGNOSIS — E1121 Type 2 diabetes mellitus with diabetic nephropathy: Secondary | ICD-10-CM

## 2015-12-13 DIAGNOSIS — N183 Chronic kidney disease, stage 3 unspecified: Secondary | ICD-10-CM

## 2015-12-13 DIAGNOSIS — E785 Hyperlipidemia, unspecified: Secondary | ICD-10-CM | POA: Insufficient documentation

## 2015-12-13 DIAGNOSIS — E1122 Type 2 diabetes mellitus with diabetic chronic kidney disease: Secondary | ICD-10-CM | POA: Insufficient documentation

## 2015-12-13 DIAGNOSIS — I1310 Hypertensive heart and chronic kidney disease without heart failure, with stage 1 through stage 4 chronic kidney disease, or unspecified chronic kidney disease: Secondary | ICD-10-CM | POA: Insufficient documentation

## 2015-12-13 DIAGNOSIS — I255 Ischemic cardiomyopathy: Secondary | ICD-10-CM | POA: Insufficient documentation

## 2015-12-13 DIAGNOSIS — Z951 Presence of aortocoronary bypass graft: Secondary | ICD-10-CM

## 2015-12-13 DIAGNOSIS — Z9889 Other specified postprocedural states: Secondary | ICD-10-CM | POA: Diagnosis not present

## 2015-12-13 DIAGNOSIS — I059 Rheumatic mitral valve disease, unspecified: Secondary | ICD-10-CM | POA: Insufficient documentation

## 2015-12-19 DIAGNOSIS — H2511 Age-related nuclear cataract, right eye: Secondary | ICD-10-CM | POA: Diagnosis not present

## 2015-12-21 NOTE — Progress Notes (Signed)
12/22/2015 Miguel Vazquez   Feb 22, 1941  [id number]  Primary Physician Miguel Post, MD Primary Cardiologist: Dr Miguel Vazquez  HPI:  75 y/o male seen for follow up CAD. He has a history of DM, HTN, CRI, and PVD s/p remote AAA R&G, admitted 03/18/15 with a STEMI. At cath he had severe 3V and left main CAD. EF 20--25%. He went for urgent CABG. He tolerated this well. Vazquez op he had PAF and was placed on Amiodarone. Amiodarone has since been discontinued. Echo in January 2017showed EF 35-40%. He was placed on lisinopril but developed hyperkalemia so this was stopped. On his follow up we resumed at a lower dose and potassium levels have been normal.  On follow up today he is doing well. No chest pain or SOB. No palpitations. 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 Feels very well. He has gained 9 lbs since June and 16 lbs since March. Reports BP at home has been good.   Current Outpatient Prescriptions  Medication Sig Dispense Refill  . aspirin EC 81 MG tablet Take 1 tablet (81 mg total) by mouth daily.    Marland Kitchen atorvastatin (LIPITOR) 40 MG tablet TAKE 1 TABLET (40 MG TOTAL) BY MOUTH DAILY. 90 tablet 1  . carvedilol (COREG) 6.25 MG tablet Take 1 tablet (6.25 mg total) by mouth 2 (two) times daily. 180 tablet 3  . DUREZOL 0.05 % EMUL Place 1 drop into the right eye daily.    Marland Kitchen glucose blood (BAYER CONTOUR NEXT TEST) test strip Test blood sugars as needed 100 each 4  . KRILL OIL PO Take 1 capsule by mouth daily.     Marland Kitchen lisinopril (PRINIVIL,ZESTRIL) 5 MG tablet Take 1 tablet (5 mg total) by mouth daily. 90 tablet 3  . metFORMIN (GLUCOPHAGE) 500 MG tablet TAKE 2 TABLETS BY MOUTH TWICE A DAY 120 tablet 1  . Multiple Vitamin (MULTIVITAMIN) tablet Take 1 tablet by mouth daily. Centrium Silver once daily     . omeprazole (PRILOSEC) 20 MG capsule Take 1 capsule (20 mg total) by mouth daily. 90 capsule 3   No current facility-administered medications for this visit.      Allergies  Allergen Reactions  . Ace Inhibitors Other (See Comments)    Hyperkaylemia    Social History   Social History  . Marital status: Married    Spouse name: [name]  . Number of children: 3  . Years of education: N/A   Occupational History  . Not on file.   Social History Main Topics  . Smoking status: Former Smoker    Packs/day: 1.50    Years: 20.00    Types: Cigarettes    Quit date: 08/24/1986  . Smokeless tobacco: Never Used  . Alcohol use 4.2 oz/week    7 Glasses of wine per week  . Drug use: No  . Sexual activity: Not on file   Other Topics Concern  . Not on file   Social History Narrative  . No narrative on file     Review of Systems: As noted in HPI All other systems reviewed and are otherwise negative except as noted above.    Blood pressure 140/89, pulse 71, height 5\' 8"  (1.727 m), weight 175 lb 12.8 oz (79.7 kg).  GENERAL:  Well appearing WM in NAD HEENT:  PERRL, EOMI, sclera are clear. Oropharynx is clear. NECK:  No jugular venous distention, carotid upstroke brisk and symmetric, no bruits, no thyromegaly or adenopathy LUNGS:  Clear to auscultation bilaterally CHEST:  Well healed sternotomy scar.  HEART:  RRR,  PMI not displaced or sustained,S1 and S2 within normal limits, no S3, no S4: no clicks, no rubs, no murmurs ABD:  Soft, nontender. BS +, no masses or bruits. No hepatomegaly, no splenomegaly EXT:  2 + pulses throughout, no edema, no cyanosis no clubbing SKIN:  Warm and dry.  No rashes NEURO:  Alert and oriented x 3. Cranial nerves II through XII intact. PSYCH:  Cognitively intact    EKG not done today.  Lab Results  Component Value Date   WBC 8.8 04/06/2015   HGB 11.7 (L) 04/06/2015   HCT 36.5 (L) 04/06/2015   PLT 309 04/06/2015   GLUCOSE 149 (H) 09/16/2015   CHOL 141 07/15/2015   TRIG 113 07/15/2015   HDL 43 07/15/2015   LDLDIRECT 84.1 05/21/2013   LDLCALC 75 07/15/2015   ALT 19 07/15/2015   AST 16 07/15/2015   NA  138 09/16/2015   K 4.8 09/16/2015   CL 103 09/16/2015   CREATININE 0.97 09/16/2015   BUN 21 09/16/2015   CO2 24 09/16/2015   INR 1.63 (H) 03/18/2015   HGBA1C 6.5 06/13/2015   MICROALBUR 6.8 (H) 11/21/2009   Echo: 12/13/15: Study Conclusions  - Left ventricle: The cavity size was normal. Wall thickness was   increased in a pattern of mild LVH. Systolic function was mildly   to moderately reduced. The estimated ejection fraction was in the   range of 40% to 45%. Diffuse hypokinesis. There is dyskinesis of   the basalinferolateral myocardium. Doppler parameters are   consistent with abnormal left ventricular relaxation (grade 1   diastolic dysfunction). - Mitral valve: Calcified annulus. - Left atrium: The atrium was mildly dilated. - Pulmonary arteries: Systolic pressure was mildly increased. PA   peak pressure: 32 mm Hg (S).  Impressions:  - Global hypokinesis and dyskinesis of the basal inferior lateral   wall; overall mild to moderate LV dysfunction; grade 1 diastolic   dysfunction; mild LAE; mild TR with mildly elevated pulmonary   pressure.   ASSESSMENT AND PLAN:  1. CAD s/p STEMI in December 2016. Urgent CABG for severe 3 vessel and left main disease. He is asymptomatic.Marland Kitchen Continue ASA 81 mg daily, beta blocker, and statin.  2. Ischemic cardiomyopathy. EF improved to 40-45%. Well compensated. Will continue Coreg  6.25 mg bid and lisinopril 5 mg daily. He will monitor his BP more closely at home. If over 130/80 we will increase lisinopril +/- coreg.   3. Hyperlipidemia. On lipitor. Repeat fasting lab work in 6 months.  4. Vazquez op Afib. Resolved. No recurrence  5. DM type 2 with renal manifestations. Needs to focus on weight loss.   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 S/p AAA repair.     Miguel Seibold Martinique MD,FACC  12/22/2015 7:55 AM

## 2015-12-22 ENCOUNTER — Encounter: Payer: Self-pay | Admitting: Cardiology

## 2015-12-22 ENCOUNTER — Ambulatory Visit (INDEPENDENT_AMBULATORY_CARE_PROVIDER_SITE_OTHER): Payer: PPO | Admitting: Cardiology

## 2015-12-22 VITALS — BP 140/89 | HR 71 | Ht 68.0 in | Wt 175.8 lb

## 2015-12-22 DIAGNOSIS — I255 Ischemic cardiomyopathy: Secondary | ICD-10-CM

## 2015-12-22 DIAGNOSIS — E669 Obesity, unspecified: Secondary | ICD-10-CM

## 2015-12-22 DIAGNOSIS — Z951 Presence of aortocoronary bypass graft: Secondary | ICD-10-CM

## 2015-12-22 DIAGNOSIS — I480 Paroxysmal atrial fibrillation: Secondary | ICD-10-CM

## 2015-12-22 DIAGNOSIS — I25810 Atherosclerosis of coronary artery bypass graft(s) without angina pectoris: Secondary | ICD-10-CM

## 2015-12-22 DIAGNOSIS — E1121 Type 2 diabetes mellitus with diabetic nephropathy: Secondary | ICD-10-CM

## 2015-12-22 DIAGNOSIS — E785 Hyperlipidemia, unspecified: Secondary | ICD-10-CM

## 2015-12-22 DIAGNOSIS — I10 Essential (primary) hypertension: Secondary | ICD-10-CM

## 2015-12-22 NOTE — Patient Instructions (Signed)
Stop Prilosec  Check your blood pressure at home. If it is still over 130/80 let me know.  You need to focus on losing weight  I will see you in 6 months with fasting labs

## 2015-12-30 ENCOUNTER — Telehealth: Payer: Self-pay | Admitting: Cardiology

## 2015-12-30 DIAGNOSIS — Z79899 Other long term (current) drug therapy: Secondary | ICD-10-CM

## 2015-12-30 MED ORDER — LISINOPRIL 10 MG PO TABS: 10.0000 mg | ORAL_TABLET | Freq: Every day | ORAL | 3 refills | Status: DC

## 2015-12-30 NOTE — Telephone Encounter (Signed)
I would increase lisinopril to 10 mg daily. Repeat BMET in 2 weeks.  Duwayne Matters Martinique MD, [facility]

## 2015-12-30 NOTE — Telephone Encounter (Signed)
Pt's wife calling back to update Dr. Martinique regarding BP trends. Notes they were told to call back if BPs continued to remain about 130/80, which she reports they have. (see as noted in operator's documentation)  Notes patient taking 5mg  lisinopril daily and has been on higher dose before. Aware I will route to Dr. Martinique to advise and return call w recommendations.

## 2015-12-30 NOTE — Telephone Encounter (Signed)
Medication changes and labwork orders communicated to caller, who voiced understanding. Will continue to monitor patient's BP and note if concerns. Aware to present for labwork in 2 weeks for BMET only. (Note patient has fasting labs on file for 6 month return)

## 2015-12-30 NOTE — Telephone Encounter (Signed)
New message       Pt c/o BP issue: STAT if pt c/o blurred vision, one-sided weakness or slurred speech  1. What are your last 5 BP readings? tues 127/82,  Wed 134/87, thurs  145/89,  [id number] 2. Are you having any other symptoms (ex. Dizziness, headache, blurred vision, passed out)? fatigue 3. What is your BP issue? bp is going up daily.  Please advise

## 2016-01-02 DIAGNOSIS — H2511 Age-related nuclear cataract, right eye: Secondary | ICD-10-CM | POA: Diagnosis not present

## 2016-01-02 DIAGNOSIS — H25811 Combined forms of age-related cataract, right eye: Secondary | ICD-10-CM | POA: Diagnosis not present

## 2016-01-03 DIAGNOSIS — H2512 Age-related nuclear cataract, left eye: Secondary | ICD-10-CM | POA: Diagnosis not present

## 2016-01-06 ENCOUNTER — Observation Stay (HOSPITAL_COMMUNITY)
Admission: EM | Admit: 2016-01-06 | Discharge: 2016-01-07 | Disposition: A | Discharge status: Home / Self Care | Payer: PPO | Attending: Internal Medicine | Admitting: Internal Medicine

## 2016-01-06 ENCOUNTER — Emergency Department (HOSPITAL_COMMUNITY): Payer: PPO

## 2016-01-06 ENCOUNTER — Telehealth: Payer: Self-pay | Admitting: Cardiology

## 2016-01-06 ENCOUNTER — Observation Stay (HOSPITAL_COMMUNITY): Payer: PPO

## 2016-01-06 ENCOUNTER — Encounter (HOSPITAL_COMMUNITY): Payer: Self-pay | Admitting: *Deleted

## 2016-01-06 DIAGNOSIS — Z79899 Other long term (current) drug therapy: Secondary | ICD-10-CM | POA: Insufficient documentation

## 2016-01-06 DIAGNOSIS — G0430 Acute necrotizing hemorrhagic encephalopathy, unspecified: Secondary | ICD-10-CM | POA: Diagnosis not present

## 2016-01-06 DIAGNOSIS — I2510 Atherosclerotic heart disease of native coronary artery without angina pectoris: Secondary | ICD-10-CM | POA: Insufficient documentation

## 2016-01-06 DIAGNOSIS — E1122 Type 2 diabetes mellitus with diabetic chronic kidney disease: Secondary | ICD-10-CM | POA: Diagnosis not present

## 2016-01-06 DIAGNOSIS — E669 Obesity, unspecified: Secondary | ICD-10-CM | POA: Diagnosis not present

## 2016-01-06 DIAGNOSIS — R51 Headache: Secondary | ICD-10-CM

## 2016-01-06 DIAGNOSIS — Z951 Presence of aortocoronary bypass graft: Secondary | ICD-10-CM | POA: Diagnosis not present

## 2016-01-06 DIAGNOSIS — Z7984 Long term (current) use of oral hypoglycemic drugs: Secondary | ICD-10-CM | POA: Insufficient documentation

## 2016-01-06 DIAGNOSIS — I10 Essential (primary) hypertension: Secondary | ICD-10-CM

## 2016-01-06 DIAGNOSIS — Z6826 Body mass index (BMI) 26.0-26.9, adult: Secondary | ICD-10-CM | POA: Diagnosis not present

## 2016-01-06 DIAGNOSIS — I255 Ischemic cardiomyopathy: Secondary | ICD-10-CM | POA: Insufficient documentation

## 2016-01-06 DIAGNOSIS — Z87891 Personal history of nicotine dependence: Secondary | ICD-10-CM | POA: Diagnosis not present

## 2016-01-06 DIAGNOSIS — Z7982 Long term (current) use of aspirin: Secondary | ICD-10-CM | POA: Insufficient documentation

## 2016-01-06 DIAGNOSIS — E1121 Type 2 diabetes mellitus with diabetic nephropathy: Secondary | ICD-10-CM

## 2016-01-06 DIAGNOSIS — I25810 Atherosclerosis of coronary artery bypass graft(s) without angina pectoris: Secondary | ICD-10-CM | POA: Diagnosis not present

## 2016-01-06 DIAGNOSIS — R519 Headache, unspecified: Secondary | ICD-10-CM | POA: Diagnosis present

## 2016-01-06 DIAGNOSIS — I160 Hypertensive urgency: Secondary | ICD-10-CM | POA: Diagnosis not present

## 2016-01-06 DIAGNOSIS — I129 Hypertensive chronic kidney disease with stage 1 through stage 4 chronic kidney disease, or unspecified chronic kidney disease: Secondary | ICD-10-CM | POA: Diagnosis not present

## 2016-01-06 DIAGNOSIS — N183 Chronic kidney disease, stage 3 unspecified: Secondary | ICD-10-CM | POA: Diagnosis present

## 2016-01-06 DIAGNOSIS — E785 Hyperlipidemia, unspecified: Secondary | ICD-10-CM | POA: Diagnosis present

## 2016-01-06 DIAGNOSIS — I671 Cerebral aneurysm, nonruptured: Principal | ICD-10-CM

## 2016-01-06 DIAGNOSIS — J9811 Atelectasis: Secondary | ICD-10-CM | POA: Diagnosis not present

## 2016-01-06 DIAGNOSIS — Z66 Do not resuscitate: Secondary | ICD-10-CM | POA: Diagnosis not present

## 2016-01-06 DIAGNOSIS — I252 Old myocardial infarction: Secondary | ICD-10-CM | POA: Insufficient documentation

## 2016-01-06 DIAGNOSIS — G9340 Encephalopathy, unspecified: Secondary | ICD-10-CM

## 2016-01-06 HISTORY — DX: Atherosclerotic heart disease of native coronary artery without angina pectoris: I25.10

## 2016-01-06 LAB — I-STAT CHEM 8, ED
BUN: 25 mg/dL — AB (ref 6–20)
CALCIUM ION: 1.11 mmol/L — AB (ref 1.15–1.40)
CREATININE: 0.9 mg/dL (ref 0.61–1.24)
Chloride: 100 mmol/L — ABNORMAL LOW (ref 101–111)
GLUCOSE: 248 mg/dL — AB (ref 65–99)
HCT: 46 % (ref 39.0–52.0)
HEMOGLOBIN: 15.6 g/dL (ref 13.0–17.0)
Potassium: 7.5 mmol/L (ref 3.5–5.1)
Sodium: 134 mmol/L — ABNORMAL LOW (ref 135–145)
TCO2: 30 mmol/L (ref 0–100)

## 2016-01-06 LAB — GLUCOSE, CAPILLARY
Glucose-Capillary: 185 mg/dL — ABNORMAL HIGH (ref 65–99)
Glucose-Capillary: 104 mg/dL — ABNORMAL HIGH (ref 65–99)

## 2016-01-06 LAB — CBC
HEMATOCRIT: 44 % (ref 39.0–52.0)
Hemoglobin: 15 g/dL (ref 13.0–17.0)
MCH: 33.2 pg (ref 26.0–34.0)
MCHC: 34.1 g/dL (ref 30.0–36.0)
MCV: 97.3 fL (ref 78.0–100.0)
Platelets: 149 10*3/uL — ABNORMAL LOW (ref 150–400)
RBC: 4.52 MIL/uL (ref 4.22–5.81)
RDW: 13.8 % (ref 11.5–15.5)
WBC: 13.5 10*3/uL — ABNORMAL HIGH (ref 4.0–10.5)

## 2016-01-06 LAB — COMPREHENSIVE METABOLIC PANEL
ALK PHOS: 69 U/L (ref 38–126)
ALT: 20 U/L (ref 17–63)
ANION GAP: 8 (ref 5–15)
AST: 22 U/L (ref 15–41)
Albumin: 3.8 g/dL (ref 3.5–5.0)
BILIRUBIN TOTAL: 0.9 mg/dL (ref 0.3–1.2)
BUN: 14 mg/dL (ref 6–20)
CALCIUM: 9.8 mg/dL (ref 8.9–10.3)
CO2: 29 mmol/L (ref 22–32)
Chloride: 99 mmol/L — ABNORMAL LOW (ref 101–111)
Creatinine, Ser: 0.97 mg/dL (ref 0.61–1.24)
GLUCOSE: 240 mg/dL — AB (ref 65–99)
POTASSIUM: 4.5 mmol/L (ref 3.5–5.1)
Sodium: 136 mmol/L (ref 135–145)
TOTAL PROTEIN: 6.8 g/dL (ref 6.5–8.1)

## 2016-01-06 LAB — MAGNESIUM: Magnesium: 1.5 mg/dL — ABNORMAL LOW (ref 1.7–2.4)

## 2016-01-06 LAB — CBG MONITORING, ED: Glucose-Capillary: 264 mg/dL — ABNORMAL HIGH (ref 65–99)

## 2016-01-06 LAB — I-STAT TROPONIN, ED: Troponin i, poc: 0.02 ng/mL (ref 0.00–0.08)

## 2016-01-06 LAB — PHOSPHORUS: Phosphorus: 2.9 mg/dL (ref 2.5–4.6)

## 2016-01-06 IMAGING — CT CT ANGIO HEAD
2 of 12 series · 8 of 47 positions shown · IV contrast (Omni 300)
Comparison: None.

CLINICAL DATA: 74-year-old male who awoke with frontal headache
nausea vomiting. Hypertension. Initial encounter.

EXAM:
CT ANGIOGRAPHY HEAD
TECHNIQUE: Multidetector CT imaging of the head was performed using the
standard protocol during bolus administration of intravenous
contrast. Multiplanar CT image reconstructions and MIPs were
obtained to evaluate the vascular anatomy.
CONTRAST:  50 mL Isovue 370.

[Series 6: head bone · axial · 0.44mm/px · z∈[-132,-40]mm · 4 of 78 slices shown]
[im 16/78  bone]
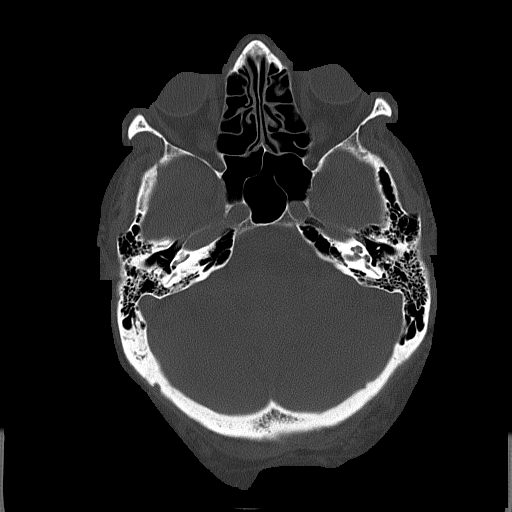
[im 31/78  bone]
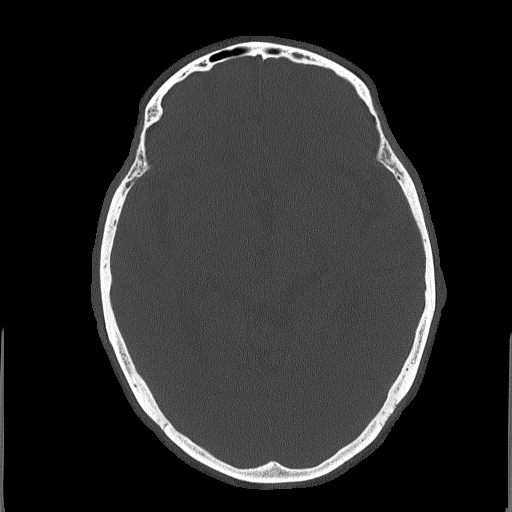
[im 47/78  bone]
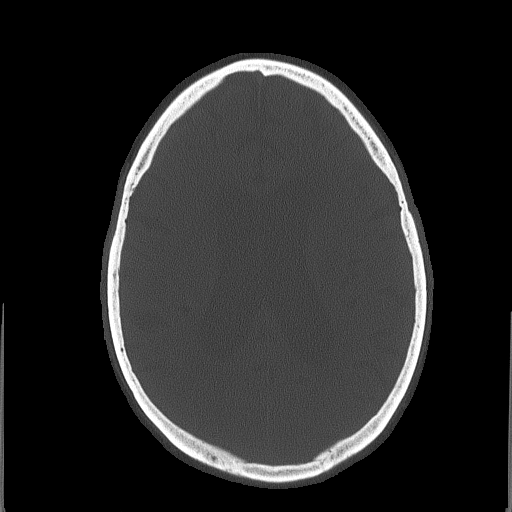
[im 62/78  bone]
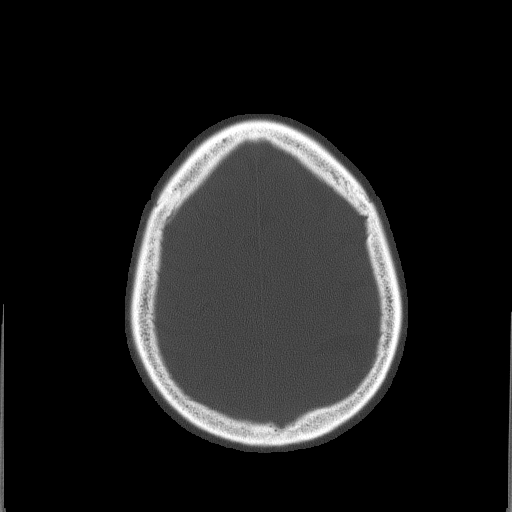

[Series 9: cow 2.0 · axial · 0.44mm/px · z∈[-159,-61]mm · 4 of 83 slices shown]
[im 17/83  brain]
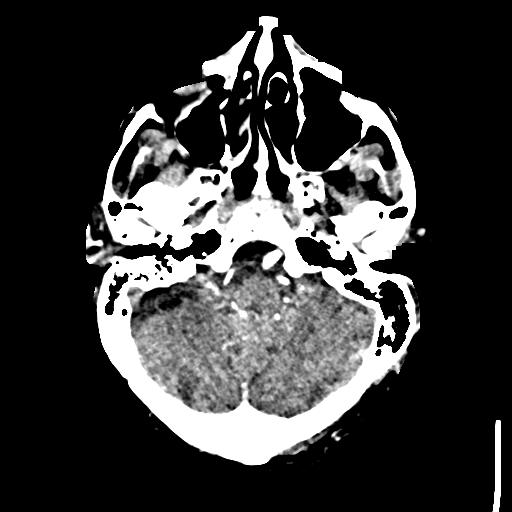
[im 33/83  bone]
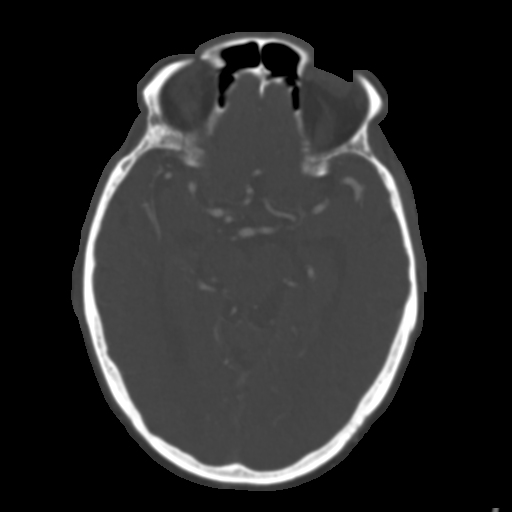
[im 50/83  brain]
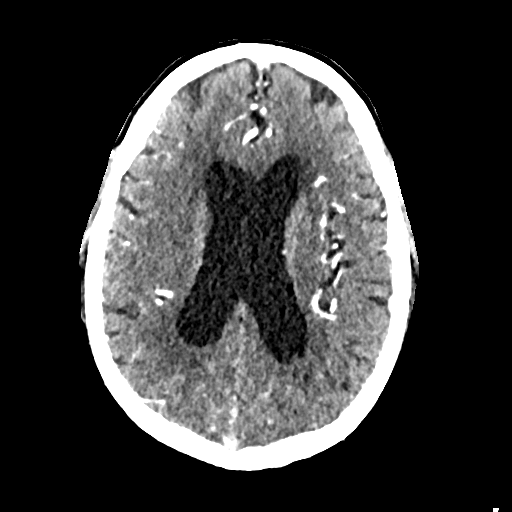
[im 66/83  bone]
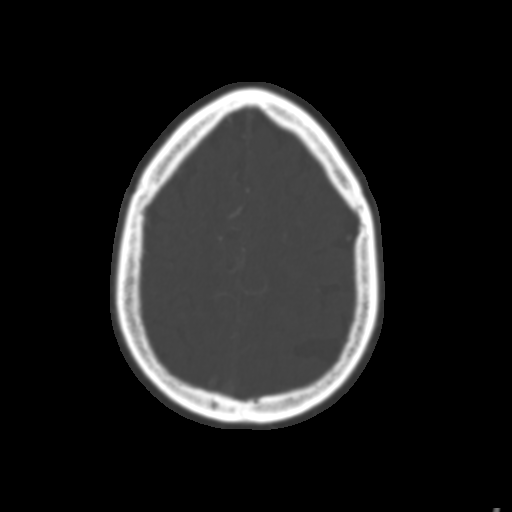

[8 of 47 positions shown; findings below may reference images not displayed]

FINDINGS: CT HEAD

Brain: Patchy bilateral cerebral white matter and deep gray matter
hypodensity. Cerebral volume loss, appears fairly generalized. Mild
ventricular prominence likely is ex vacuo related. No midline shift,
mass effect, or evidence of intracranial mass lesion. No acute
intracranial hemorrhage identified. No cortically based acute
infarct identified. There is a small dystrophic calcification
adjacent to the right fornix column which appears inconsequential.

Vascular: Extensive Calcified atherosclerosis at the skull base.

Skull: Intact.  No acute osseous abnormality identified.

Sinuses: Visualized paranasal sinuses and mastoids are well
pneumatized.

Orbits: No acute orbit or scalp soft tissue findings.

CTA HEAD

Posterior circulation: Dominant and dolichoectatic distal right
vertebral artery. There is fusiform aneurysmal enlargement of the
distal right vertebral up to 8 mm diameter (series 11, image 139) in
the region of the right PICA origin which remains patent. Fairly
extensive left greater than right distal vertebral artery calcified
atherosclerosis, but no hemodynamically significant stenosis
results. The left PICA is patent.

Tortuous vertebrobasilar junction with calcified plaque at the left
vertebrobasilar junction which does appear hemodynamically
significant. Fusiform enlargement of the proximal basilar artery
measuring 8-9 mm diameter. Tortuous and calcified proximal basilar
artery with atherosclerotic stenosis estimated at 60 % with respect
to the distal vessel (series 12, image 111). Widely patent basilar
tip. Normal SCA and PCA origins.

Tortuous right P1 segment. Posterior communicating arteries are
diminutive or absent. Bilateral PCA branches are normal aside from
tortuosity.

Anterior circulation: Tortuous distal cervical ICAs. Both ICA
siphons are patent. There is severe calcified atherosclerosis of the
right siphon, moderate on the left. Superimposed bilateral siphon
tortuosity. There is no significant siphon stenosis on the left.
There is moderate siphon stenosis in the cavernous and supraclinoid
segments on the right. Normal ophthalmic artery origins. Patent
carotid termini. Normal MCA and ACA origins.

Tortuous A1 and proximal A2 segments. Normal anterior communicating
artery. Bilateral ACA branches are normal aside from tortuosity.
Left MCA M1 segment and left MCA branches are normal aside from
tortuosity. Right MCA M1 segment and right MCA branches are normal
aside from tortuosity.

Venous sinuses: Patent.

Anatomic variants: Dominant distal right vertebral artery.

Delayed phase: No abnormal enhancement identified.
IMPRESSION: 1. No acute intracranial abnormality. Negative for intracranial
saccular aneurysm or large vessel occlusion.
2. Generalized arterial dolichoectasia. There is fusiform aneurysmal
enlargement of the dominant distal Right Vertebral artery and the
Basilar Artery, measuring 8 mm diameter each.
3. Extensive calcified intracranial atherosclerosis. Hemodynamically
significant (moderate) appearing stenoses of the right ICA siphon
(cavernous and supraclinoid segments) and proximal basilar artery.
4. Moderately advanced cerebral white matter and deep gray matter
nuclei heterogeneity which likely reflects chronic small vessel
disease.

## 2016-01-06 IMAGING — CR DG CHEST 2V
2 series · 2 of 2 positions shown · non-contrast
Comparison: [DATE] study obtained earlier in the day

CLINICAL DATA: Headaches.  Hypertension.

EXAM:
CHEST  2 VIEW

[chest pa]
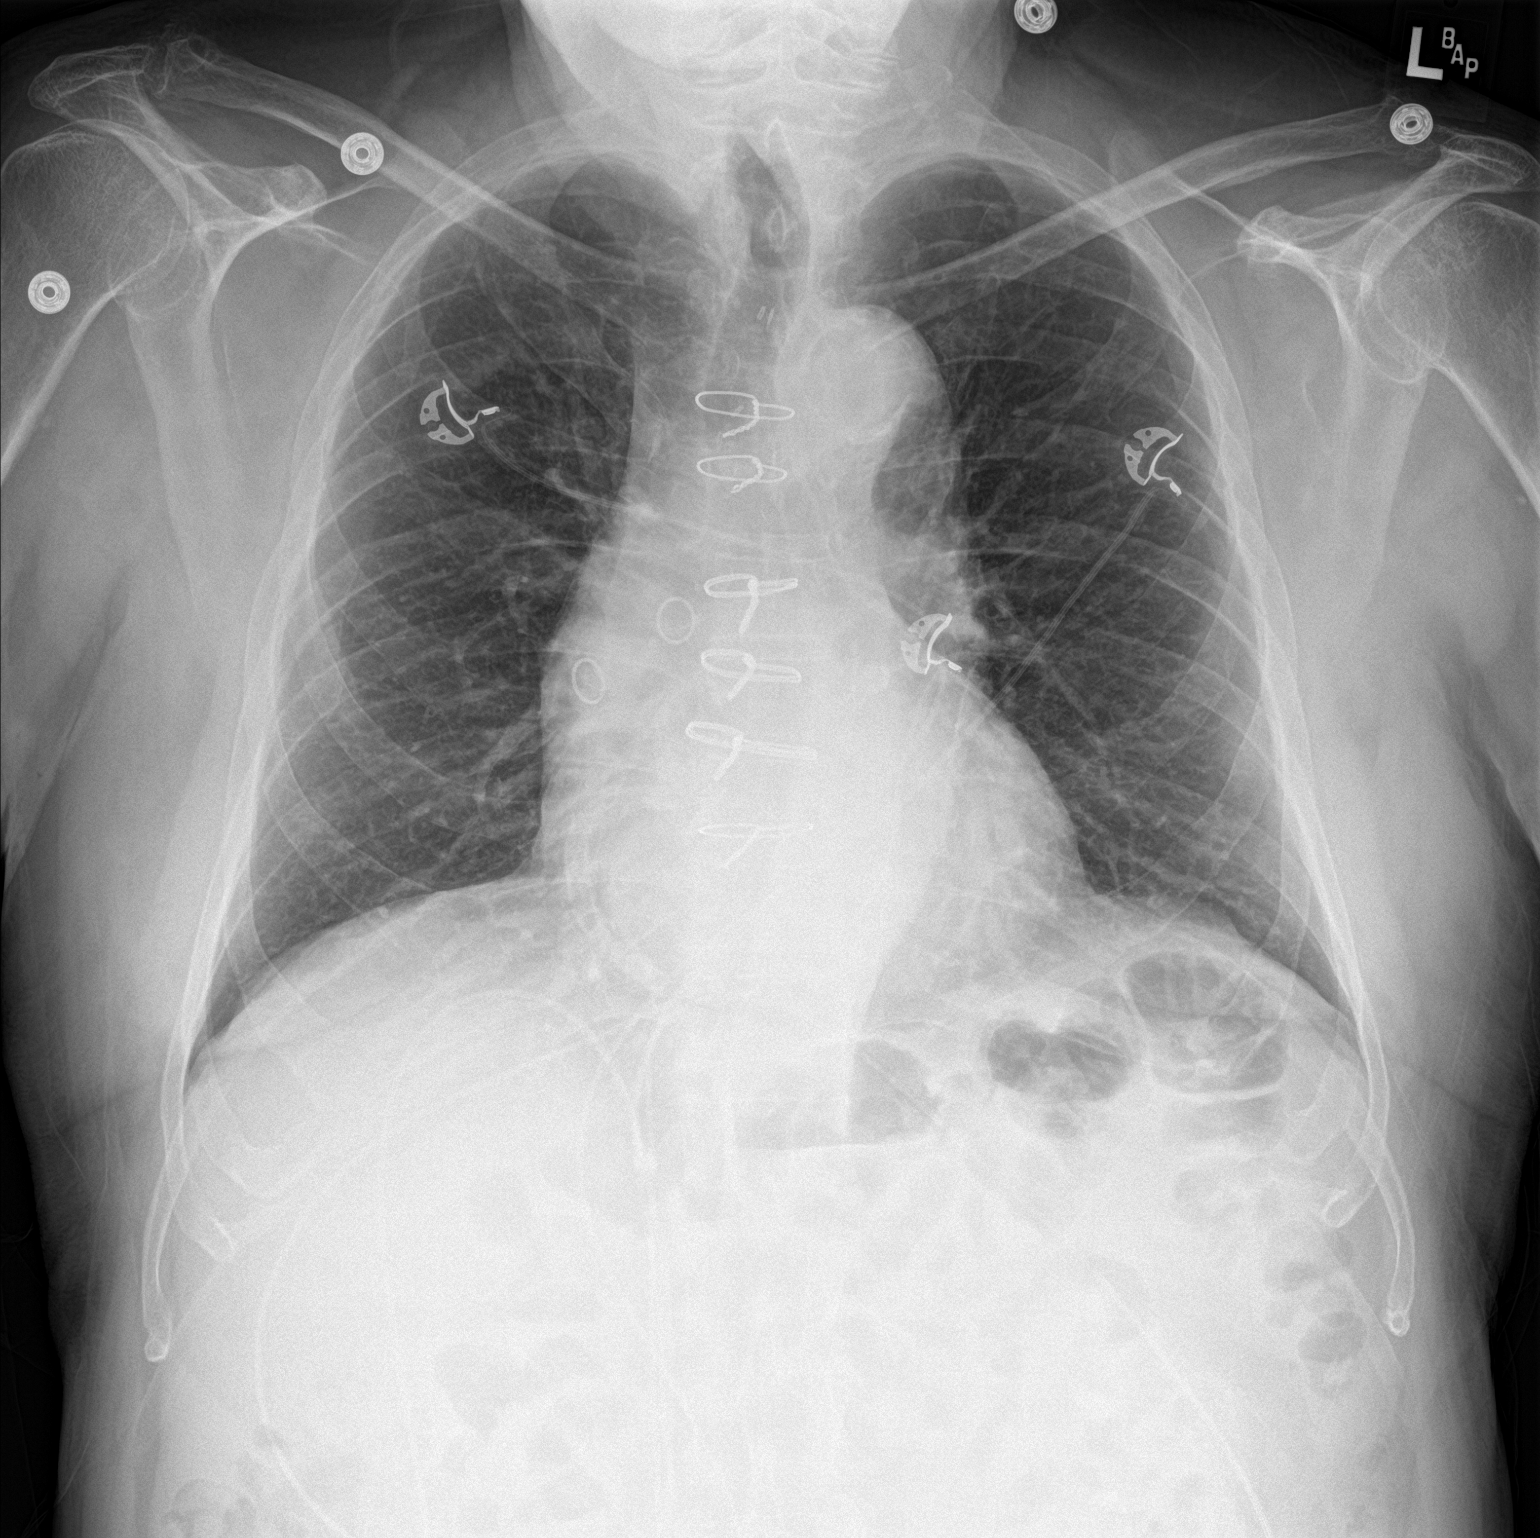

[chest lat]
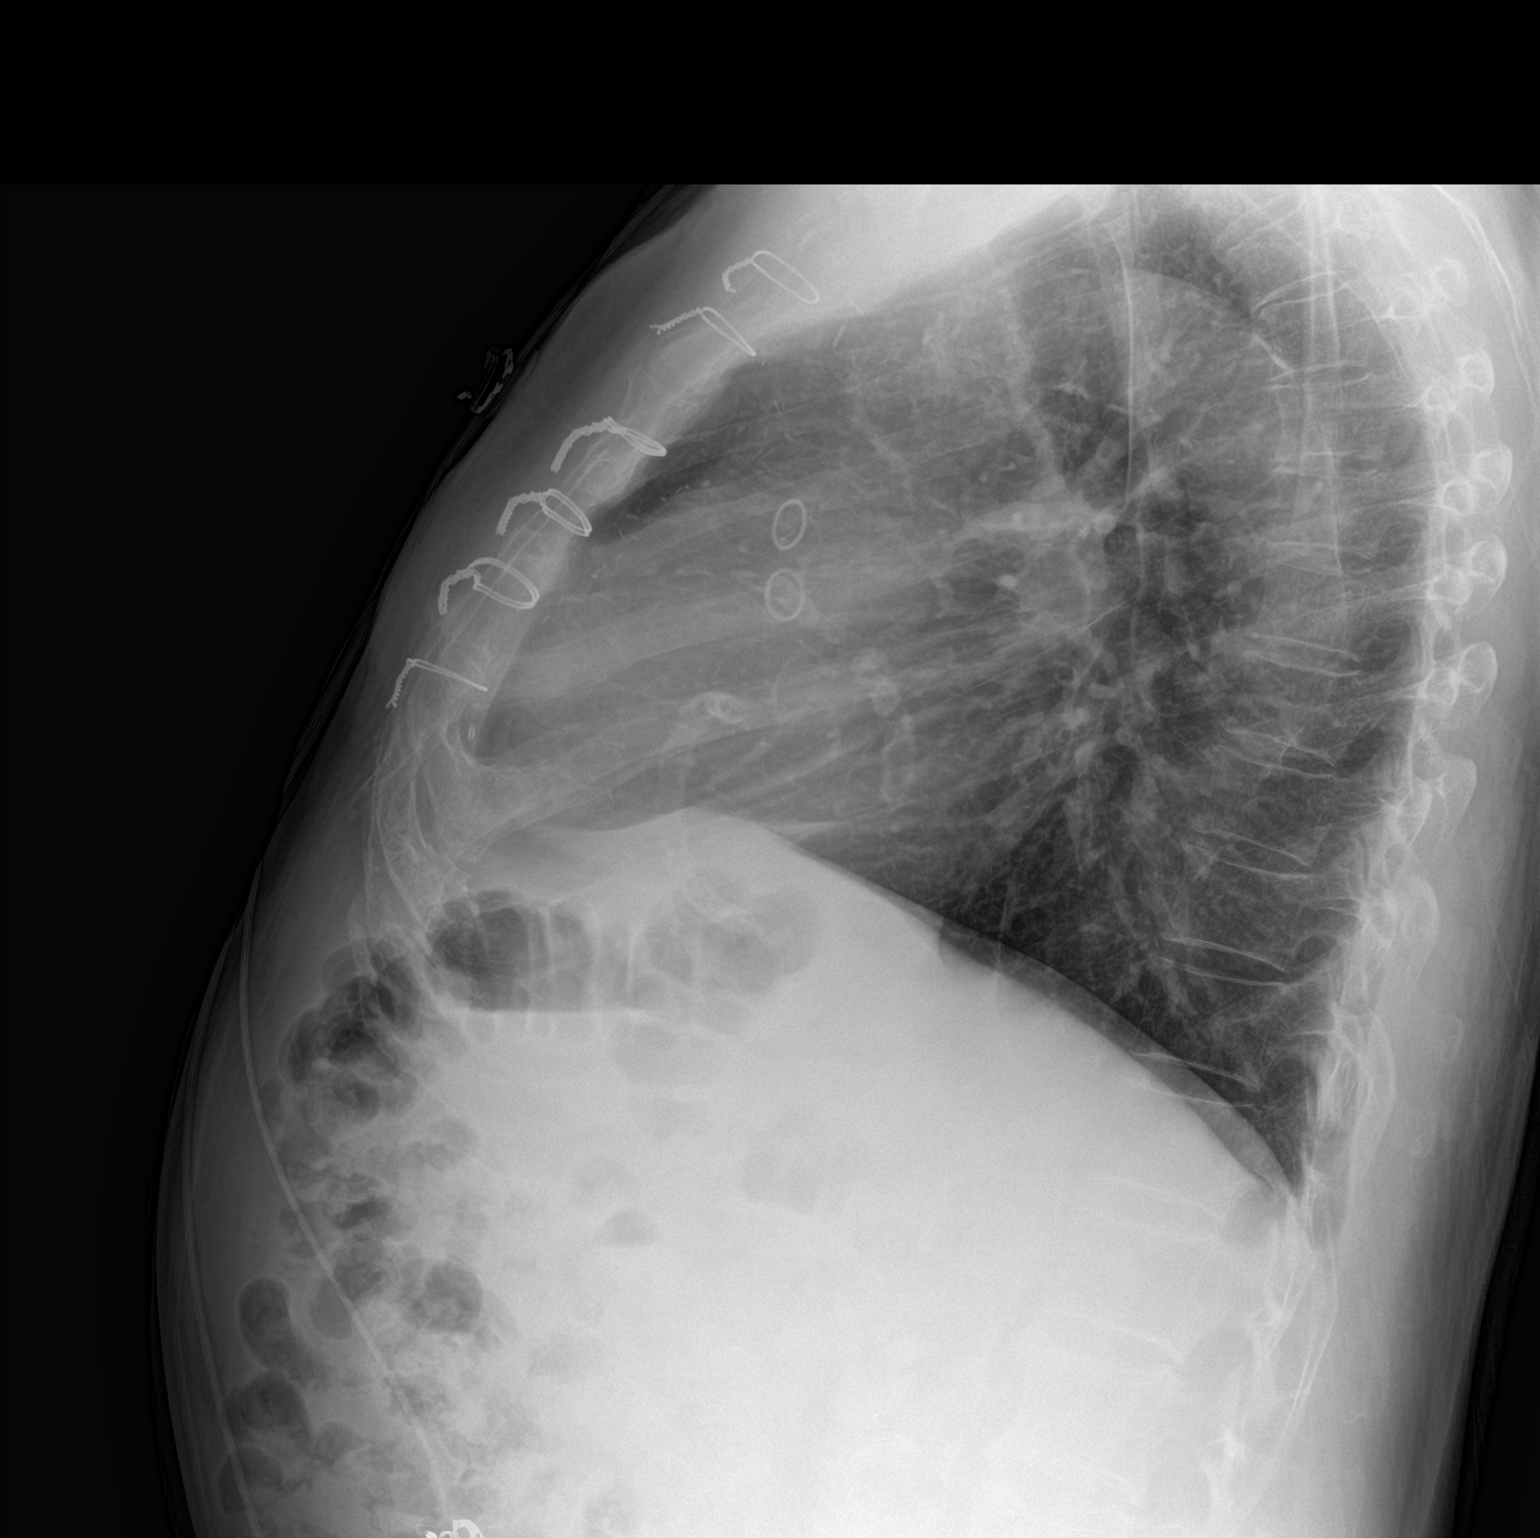

[2 of 2 positions shown; findings below may reference images not displayed]

FINDINGS: There is no edema or consolidation. The heart size and pulmonary
vascularity are normal. Patient is status post coronary artery
bypass grafting. There is aortic atherosclerosis. There is extensive
native coronary artery calcification. No adenopathy. No bone
lesions.
IMPRESSION: No edema or consolidation.  Aortic atherosclerosis.

## 2016-01-06 IMAGING — CR DG CHEST 1V PORT
1 series · 1 of 1 positions shown · non-contrast
Comparison: [DATE]

CLINICAL DATA: Headache, confusion.  History of hypertension.

EXAM:
PORTABLE CHEST 1 VIEW

[AP]
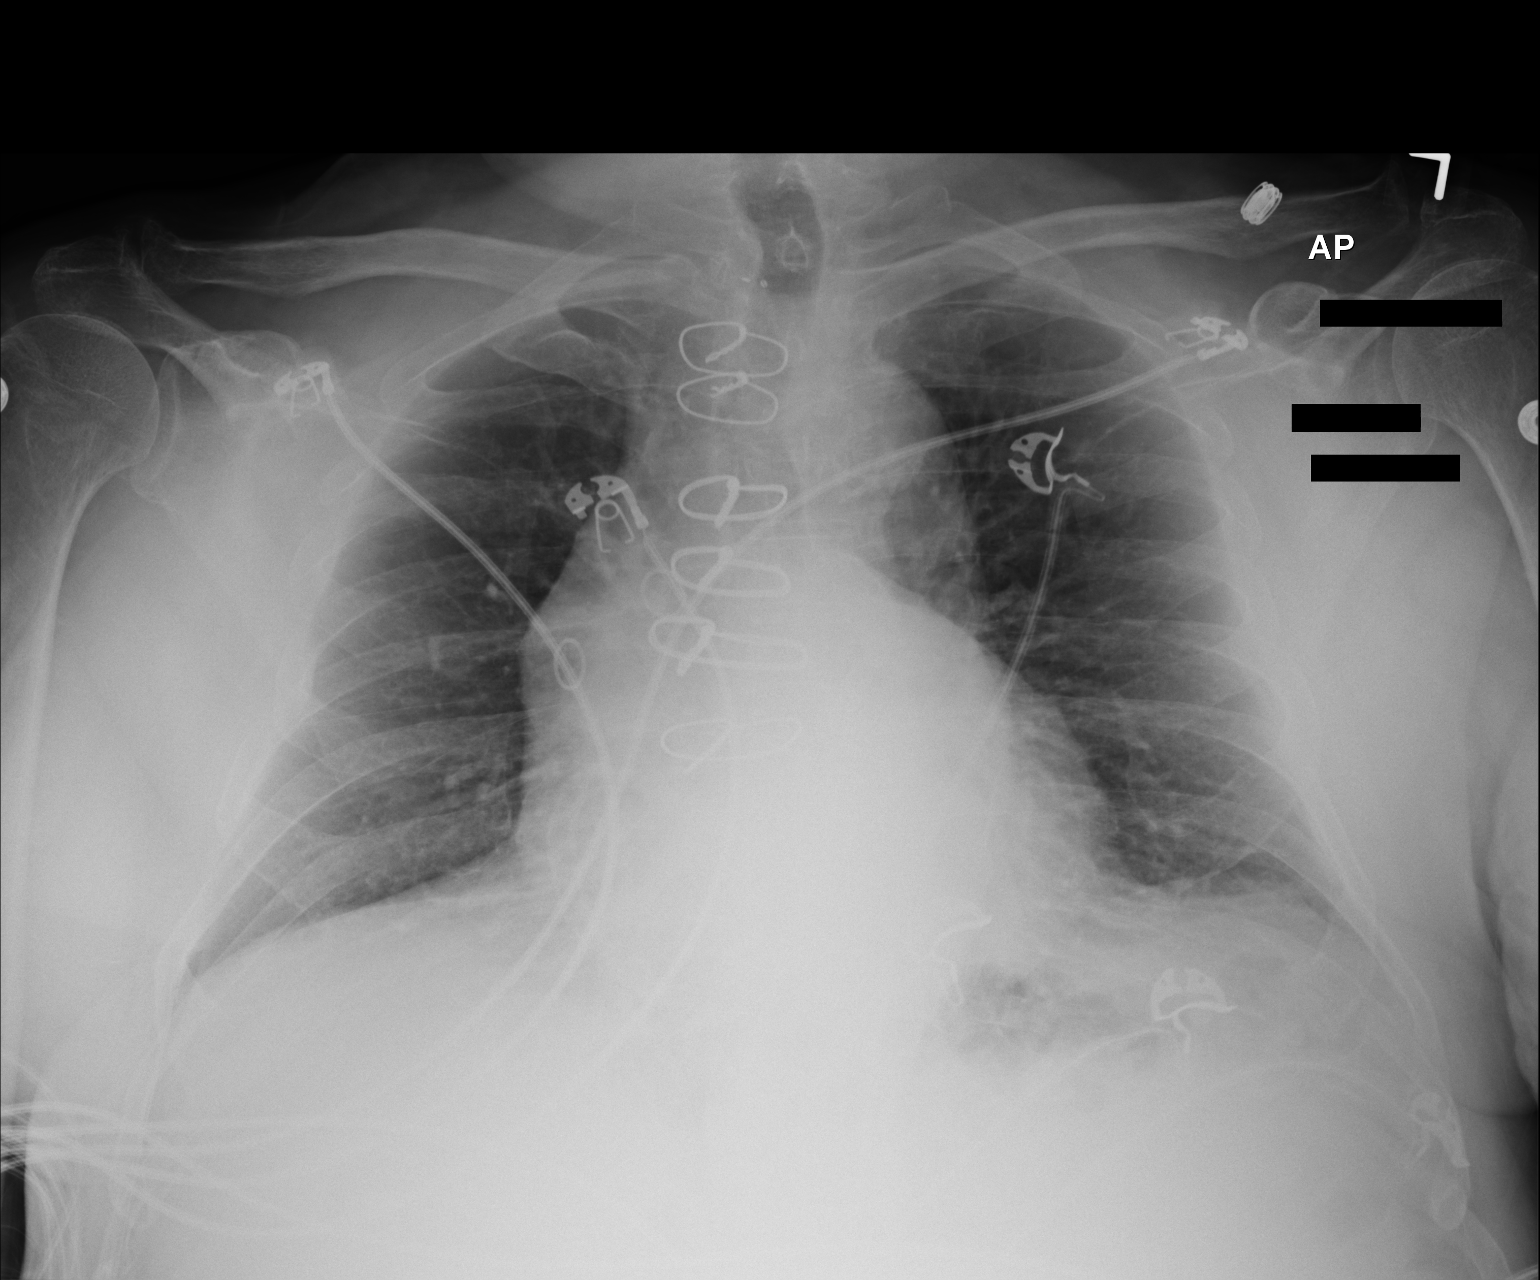

[1 of 1 positions shown; findings below may reference images not displayed]

FINDINGS: Prior CABG. Low lung volumes. Minimal bibasilar atelectasis. Heart
is normal size. No effusions or acute bony abnormality.
IMPRESSION: Low lung volumes, bibasilar atelectasis.

## 2016-01-06 MED ORDER — TRAZODONE HCL 50 MG PO TABS: 25.0000 mg | ORAL_TABLET | Freq: Every evening | ORAL | Status: DC | PRN

## 2016-01-06 MED ORDER — POLYETHYLENE GLYCOL 3350 17 G PO PACK: 17.0000 g | PACK | Freq: Every day | ORAL | Status: DC | PRN

## 2016-01-06 MED ORDER — ATORVASTATIN CALCIUM 40 MG PO TABS: 40.0000 mg | ORAL_TABLET | Freq: Every day | ORAL | Status: DC

## 2016-01-06 MED ORDER — ACETAMINOPHEN 325 MG PO TABS: 650.0000 mg | ORAL_TABLET | Freq: Four times a day (QID) | ORAL | Status: DC | PRN

## 2016-01-06 MED ORDER — SODIUM CHLORIDE 0.9% FLUSH
3.0000 mL | Freq: Two times a day (BID) | INTRAVENOUS | Status: DC
  Administered 2016-01-06 – 2016-01-07 (×2): 3 mL via INTRAVENOUS

## 2016-01-06 MED ORDER — INSULIN ASPART 100 UNIT/ML ~~LOC~~ SOLN
0.0000 [IU] | Freq: Three times a day (TID) | SUBCUTANEOUS | Status: DC
  Administered 2016-01-06 – 2016-01-07 (×2): 4 [IU] via SUBCUTANEOUS

## 2016-01-06 MED ORDER — HYDRALAZINE HCL 20 MG/ML IJ SOLN
5.0000 mg | Freq: Once | INTRAMUSCULAR | Status: AC
  Administered 2016-01-06: 5 mg via INTRAVENOUS
  Filled 2016-01-06: qty 1

## 2016-01-06 MED ORDER — HYDRALAZINE HCL 20 MG/ML IJ SOLN: 5.0000 mg | Freq: Three times a day (TID) | INTRAMUSCULAR | Status: DC | PRN

## 2016-01-06 MED ORDER — DIFLUPREDNATE 0.05 % OP EMUL
1.0000 [drp] | Freq: Every day | OPHTHALMIC | Status: DC
  Administered 2016-01-07: 1 [drp] via OPHTHALMIC

## 2016-01-06 MED ORDER — CARVEDILOL 6.25 MG PO TABS
6.2500 mg | ORAL_TABLET | Freq: Two times a day (BID) | ORAL | Status: DC
  Administered 2016-01-06 – 2016-01-07 (×2): 6.25 mg via ORAL
  Filled 2016-01-06 (×2): qty 1

## 2016-01-06 MED ORDER — METOCLOPRAMIDE HCL 5 MG/ML IJ SOLN
10.0000 mg | Freq: Once | INTRAMUSCULAR | Status: AC
  Administered 2016-01-06: 10 mg via INTRAVENOUS
  Filled 2016-01-06: qty 2

## 2016-01-06 MED ORDER — KETOROLAC TROMETHAMINE 0.5 % OP SOLN
1.0000 [drp] | Freq: Four times a day (QID) | OPHTHALMIC | Status: DC
  Administered 2016-01-06 – 2016-01-07 (×3): 1 [drp] via OPHTHALMIC
  Filled 2016-01-06: qty 3

## 2016-01-06 MED ORDER — LABETALOL HCL 5 MG/ML IV SOLN: 10.0000 mg | Freq: Once | INTRAVENOUS | Status: DC

## 2016-01-06 MED ORDER — ACETAMINOPHEN 650 MG RE SUPP: 650.0000 mg | Freq: Four times a day (QID) | RECTAL | Status: DC | PRN

## 2016-01-06 MED ORDER — LISINOPRIL 10 MG PO TABS
10.0000 mg | ORAL_TABLET | Freq: Every day | ORAL | Status: DC
  Administered 2016-01-07: 10 mg via ORAL
  Filled 2016-01-06: qty 1

## 2016-01-06 MED ORDER — KRILL OIL 500 MG PO CAPS: ORAL_CAPSULE | Freq: Every day | ORAL | Status: DC

## 2016-01-06 MED ORDER — IOPAMIDOL (ISOVUE-370) INJECTION 76%
INTRAVENOUS | Status: AC
  Administered 2016-01-06: 50 mL
  Filled 2016-01-06: qty 50

## 2016-01-06 MED ORDER — INFLUENZA VAC SPLIT QUAD 0.5 ML IM SUSY
0.5000 mL | PREFILLED_SYRINGE | INTRAMUSCULAR | Status: AC
  Administered 2016-01-07: 0.5 mL via INTRAMUSCULAR
  Filled 2016-01-06: qty 0.5

## 2016-01-06 MED ORDER — SODIUM CHLORIDE 0.9 % IV SOLN: INTRAVENOUS | Status: AC

## 2016-01-06 MED ORDER — METOCLOPRAMIDE HCL 5 MG/ML IJ SOLN: 10.0000 mg | Freq: Two times a day (BID) | INTRAMUSCULAR | Status: DC | PRN

## 2016-01-06 MED ORDER — ASPIRIN EC 325 MG PO TBEC
325.0000 mg | DELAYED_RELEASE_TABLET | Freq: Every day | ORAL | Status: DC
  Administered 2016-01-06 – 2016-01-07 (×2): 325 mg via ORAL
  Filled 2016-01-06 (×2): qty 1

## 2016-01-06 NOTE — ED Notes (Signed)
CMP hemolyzed order for recollect entered.

## 2016-01-06 NOTE — Telephone Encounter (Signed)
Pts wife Miguel Vazquez called reporting Miguel Vazquez has been experiencing a severe headache since early this morning. Also noted his blood pressure has been [id number] systolic even after adjusting his home blood pressure medications. States he is very confused and disoriented which are similar symptoms to when he had PCI back in December. No chest pain, but she reports he did not have chest pain in December either. Given these report of symptoms I advised that they come to the ED for further evaluation.

## 2016-01-06 NOTE — ED Notes (Signed)
Pt given [place] sandwich and water per Junie Panning, Therapist, sports

## 2016-01-06 NOTE — H&P (Signed)
[facility] History and Physical  Miguel Vazquez [id number] DOB: 01/19/1941 DOA: 01/06/2016  Referring physician:   PCP: Eulas Post, MD   Chief Complaint: "He described it as the worst headache he ever had."- Daughter  HPI: Miguel Vazquez is a 75 y.o. male past mental history significant for coronary artery disease, diabetes, high blood pressure and recent cataract surgery earlier this week. Per patient family  Is occurred all of a sudden overnight. Patient had severe headache. Unable to localize headache. No medications were taken for the headache.  Patient initially resistant to come into the emergency room. Family eventually able to convince patient to go to the emergency room for assistant. Patient ambulate under his own power towards his front door to leave his house and then vomited profusely. There were no episodes of vomiting after that. Patient was brought to the emergency room by POV. Denies recent head trauma.  Per patient's wife patient had no prior complaints of fever, chills, nausea, vomiting, diarrhea, rash. They do live in a heavily weighted area with lots of ticks but they deny tick bite.  ED course: Patient received a dose of Reglan and hydralazine in the emergency room which produced his elevated blood pressure aborted his headache. Imaging of the brain done in the emergency room showed fusiform aneurysms.  Medical history limited by altered mental status   Review of Systems:  As per HPI otherwise 10 point review of systems negative.    Past Medical History:  Diagnosis Date  . Arthritis   . Cataract   . Coronary artery disease   . Diabetes mellitus   . GERD (gastroesophageal reflux disease)   . Hx of abdominal aortic aneurysm 1998  . Hyperlipidemia   . Hypertension   . Personal history of colonic polyps - adenomas 09/09/2013   Past Surgical History:  Procedure Laterality Date  . ABDOMINAL AORTIC ANEURYSM REPAIR  1998  . CARDIAC CATHETERIZATION  N/A 03/18/2015   Procedure: Left Heart Cath and Coronary Angiography;  Surgeon: Peter M Martinique, MD;  Location: [facility];  Service: Cardiovascular;  Laterality: N/A;  . cataract surgery Right   . COLONOSCOPY    . CORONARY ARTERY BYPASS GRAFT N/A 03/18/2015   Procedure: CORONARY ARTERY BYPASS GRAFTING (CABG) x  three, using left internal mammary artery and right leg greater saphenous vein harvested endoscopically;  Surgeon: Melrose Nakayama, MD;  Location: [facility];  Service: Open Heart Surgery;  Laterality: N/A;  . INGUINAL HERNIA REPAIR Bilateral   . INTRAOPERATIVE TRANSESOPHAGEAL ECHOCARDIOGRAM N/A 03/18/2015   Procedure: INTRAOPERATIVE TRANSESOPHAGEAL ECHOCARDIOGRAM;  Surgeon: Melrose Nakayama, MD;  Location: [facility];  Service: Open Heart Surgery;  Laterality: N/A;  . UMBILICAL HERNIA REPAIR  1965   Social History:  reports that he quit smoking about 29 years ago. His smoking use included Cigarettes. He has a 30.00 pack-year smoking history. He has never used smokeless tobacco. He reports that he drinks about 4.2 oz of alcohol per week . He reports that he does not use drugs.  No Active Allergies  Family History  Problem Relation Age of Onset  . Diabetes Paternal Aunt   . Colon cancer Neg Hx      Prior to Admission medications   Medication Sig Start Date End Date Taking? Authorizing Provider  aspirin EC 81 MG tablet Take 1 tablet (81 mg total) by mouth daily. 09/02/15  Yes Peter M Martinique, MD  atorvastatin (LIPITOR) 40 MG tablet TAKE 1 TABLET (40 MG TOTAL) BY  MOUTH DAILY. 11/10/15  Yes Eulas Post, MD  carvedilol (COREG) 6.25 MG tablet Take 1 tablet (6.25 mg total) by mouth 2 (two) times daily. 07/01/15  Yes Peter M Martinique, MD  DUREZOL 0.05 % EMUL Place 1 drop into the right eye daily. 12/21/15  Yes Historical Provider, MD  glucose blood (BAYER CONTOUR NEXT TEST) test strip Test blood sugars as needed 03/30/15  Yes Eulas Post, MD  ibuprofen (ADVIL,MOTRIN) 200 MG  tablet Take 200 mg by mouth every 6 (six) hours as needed for moderate pain.   Yes Historical Provider, MD  KRILL OIL PO Take 1 capsule by mouth daily.    Yes Historical Provider, MD  lisinopril (PRINIVIL,ZESTRIL) 10 MG tablet Take 1 tablet (10 mg total) by mouth daily. 12/30/15  Yes Peter M Martinique, MD  metFORMIN (GLUCOPHAGE) 500 MG tablet TAKE 2 TABLETS BY MOUTH TWICE A DAY Patient taking differently: TAKE 1 TABLET BY MOUTH ONCE DAILY 09/28/15  Yes Eulas Post, MD  Multiple Vitamin (MULTIVITAMIN) tablet Take 1 tablet by mouth daily. Centrium Silver once daily    Yes Historical Provider, MD  naproxen sodium (ANAPROX) 220 MG tablet Take 220 mg by mouth 2 (two) times daily as needed (pain).   Yes Historical Provider, MD  PROLENSA 0.07 % SOLN Place 1 drop into the right eye daily. 12/21/15  Yes Historical Provider, MD   Physical Exam: Vitals:   01/06/16 1200 01/06/16 1215 01/06/16 1247 01/06/16 1300  BP: 140/79 135/82 150/76 121/88  Pulse: 70 72 70 73  Resp: 20 18 20 22   Temp:      TempSrc:      SpO2: 97% 96% 99% 99%    Wt Readings from Last 3 Encounters:  12/22/15 79.7 kg (175 lb 12.8 oz)  09/02/15 75.3 kg (166 lb)  07/01/15 72.4 kg (159 lb 9.6 oz)    General:  Appears calm and comfortable Eyes:  PERRL, EOMI, normal lids, iris ENT:  grossly normal hearing, lips & tongue Neck:  no LAD, masses or thyromegaly Cardiovascular:  RRR, no m/r/g. No LE edema.  Respiratory:  CTA bilaterally, no w/r/r. Normal respiratory effort. Abdomen:  soft, ntnd Skin:  no rash or induration seen on limited exam Musculoskeletal:  grossly normal tone BUE/BLE Psychiatric:  grossly normal mood and affect, speech fluent and appropriate Neurologic:  CN 2-12 grossly intact, moves all extremities in coordinated fashion. A&Ox2 (not time)          Labs on Admission:  Basic Metabolic Panel:  Recent Labs Lab 01/06/16 0838 01/06/16 0924  NA 134* 136  K 7.5* 4.5  CL 100* 99*  CO2  --  29  GLUCOSE 248*  240*  BUN 25* 14  CREATININE 0.90 0.97  CALCIUM  --  9.8   Liver Function Tests:  Recent Labs Lab 01/06/16 0924  AST 22  ALT 20  ALKPHOS 69  BILITOT 0.9  PROT 6.8  ALBUMIN 3.8   No results for input(s): LIPASE, AMYLASE in the last 168 hours. No results for input(s): AMMONIA in the last 168 hours. CBC:  Recent Labs Lab 01/06/16 0813 01/06/16 0838  WBC 13.5*  --   HGB 15.0 15.6  HCT 44.0 46.0  MCV 97.3  --   PLT 149*  --    Cardiac Enzymes: No results for input(s): CKTOTAL, CKMB, CKMBINDEX, TROPONINI in the last 168 hours.  BNP (last 3 results) No results for input(s): BNP in the last 8760 hours.  ProBNP (last 3 results) No  results for input(s): PROBNP in the last 8760 hours.   Creatinine clearance cannot be calculated (Unknown ideal weight.)  CBG:  Recent Labs Lab 01/06/16 0824  GLUCAP 264*    Radiological Exams on Admission: Ct Angio Head W Or Wo Contrast  Result Date: 01/06/2016 CLINICAL DATA:  75 year old male who awoke with frontal headache nausea vomiting. Hypertension. Initial encounter. EXAM: CT ANGIOGRAPHY HEAD TECHNIQUE: Multidetector CT imaging of the head was performed using the standard protocol during bolus administration of intravenous contrast. Multiplanar CT image reconstructions and MIPs were obtained to evaluate the vascular anatomy. CONTRAST:  50 mL Isovue 370. COMPARISON:  None. FINDINGS: CT HEAD Brain: Patchy bilateral cerebral white matter and deep gray matter hypodensity. Cerebral volume loss, appears fairly generalized. Mild ventricular prominence likely is ex vacuo related. No midline shift, mass effect, or evidence of intracranial mass lesion. No acute intracranial hemorrhage identified. No cortically based acute infarct identified. There is a small dystrophic calcification adjacent to the right fornix column which appears inconsequential. Vascular: Extensive Calcified atherosclerosis at the skull base. Skull: Intact.  No acute osseous  abnormality identified. Sinuses: Visualized paranasal sinuses and mastoids are well pneumatized. Orbits: No acute orbit or scalp soft tissue findings. CTA HEAD Posterior circulation: Dominant and dolichoectatic distal right vertebral artery. 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 Fairly extensive left greater than right distal vertebral artery calcified atherosclerosis, but no hemodynamically significant stenosis results. The left PICA is patent. Tortuous vertebrobasilar junction with calcified plaque at the left vertebrobasilar junction which does appear hemodynamically significant. Fusiform enlargement of the proximal basilar artery measuring 8-9 mm diameter. Tortuous and calcified proximal basilar artery with atherosclerotic stenosis estimated at 60 % with respect to the distal vessel (series 12, image 111). Widely patent basilar tip. Normal SCA and PCA origins. Tortuous right P1 segment. Posterior communicating arteries are diminutive or absent. Bilateral PCA branches are normal aside from tortuosity. Anterior circulation: Tortuous distal cervical ICAs. Both ICA siphons are patent. There is severe calcified atherosclerosis of the right siphon, moderate on the left. Superimposed bilateral siphon tortuosity. There is no significant siphon stenosis on the left. There is moderate siphon stenosis in the cavernous and supraclinoid segments on the right. Normal ophthalmic artery origins. Patent carotid termini. Normal MCA and ACA origins. Tortuous A1 and proximal A2 segments. Normal anterior communicating artery. Bilateral ACA branches are normal aside from tortuosity. Left MCA M1 segment and left MCA branches are normal aside from tortuosity. Right MCA M1 segment and right MCA branches are normal aside from tortuosity. Venous sinuses: Patent. Anatomic variants: Dominant distal right  vertebral artery. Delayed phase: No abnormal enhancement identified. IMPRESSION: 1. No acute intracranial abnormality. Negative for intracranial saccular aneurysm or large vessel occlusion. 2. 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 3. Extensive calcified intracranial atherosclerosis. Hemodynamically significant (moderate) appearing stenoses of the right ICA siphon (cavernous and supraclinoid segments) and proximal basilar artery. 4. 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 Electronically Signed   By: Genevie Ann M.D.   On: 01/06/2016 09:28   Dg Chest Portable 1 View  Result Date: 01/06/2016 CLINICAL DATA:  Headache, confusion.  History of hypertension. EXAM: PORTABLE CHEST 1 VIEW COMPARISON:  03/05/2016 FINDINGS: Prior CABG.  Low lung volumes. Minimal bibasilar atelectasis. Heart is normal size. No effusions or acute bony abnormality. IMPRESSION: Low lung volumes, bibasilar atelectasis. Electronically Signed   By: Rolm Baptise M.D.   On: 01/06/2016 09:30    EKG: Independently reviewed. Ventricular rate 62, QRS duration 102, QTC 428;  sinus rhythm with frequent PVCs, no STEMI, no acute changes when compared to January 2017 EKG  Assessment/Plan Principal Problem:   Brain aneurysm Active Problems:   Type 2 diabetes mellitus with renal manifestations (HCC)   Hyperlipidemia   Essential hypertension   Chronic renal disease, stage III   CAD (coronary artery disease) of artery bypass graft   Hypertensive urgency   Headache  Brain aneurysm/high blood pressure/headache/hypertensive urgency Patient with what sounds like a migraine headache that happened acutely Better with Reglan in the emergency room Blood pressure also under control after hydralazine 1 Discussed imaging of  brain aneurysms with Dr. Ronnald Ramp with neurosurgery who advises no action since these are fusiform except for giving patient aspirin Patient is to follow-up with Dr. Dwyane Dee with neurology as an outpatient Blood pressure goal 160/80 When necessary hydralazine 10 mg IV as needed for severe blood pressure Reglan when necessary for headache Telemetry Continue 10 mg lisinopril daily Continue outpatient Coreg 6.25 mg twice a day Starting aspirin full dose Neurochecks KVO Trazodone prn sleep Recent cataract surgery Acular eyedrops Difluprednate eyedrops  Acute encephalopathy Could be new meds for pst op surg but unlikely Patient family deny any use of over-the-counter medications that are new UA ordered CXR also checked since recent surgery BS is elevated Nothing acute on CT had  DM SSI, Hold metformin  Hyperlipidemia Continue statin Cont Krill OIl  CAD Cont ASA  PVCs on monitor Check Mg and Phos  Code Status: DNR/DNI  DVT Prophylaxis: SCD Family Communication: Wife and Dgtr at bedside Disposition Plan: Pending Improvement    Elwin Mocha, MD Family Medicine [facility] [email or web address] Password [email or web address]

## 2016-01-06 NOTE — Progress Notes (Signed)
Inpatient Diabetes Program Recommendations  AACE/ADA: New Consensus Statement on Inpatient Glycemic Control (2015)  Target Ranges:  Prepandial:   less than 140 mg/dL      Peak postprandial:   less than 180 mg/dL (1-2 hours)      Critically ill patients:  140 - 180 mg/dL   Lab Results  Component Value Date   GLUCAP 264 (H) 01/06/2016   HGBA1C 6.5 06/13/2015    Review of Glycemic Control  Diabetes history: DM2 Outpatient Diabetes medications: Metformin 500 mg BID  Current orders for Inpatient glycemic control: Novolog 0-20 units [facility]  Inpatient Diabetes Program Recommendations: Please consider:  Decreasing Novolog to 0-15 units TIDAC, 0-5 units QHS;  Current A1C.  Thank you,  Windy Carina, RN, BSN Diabetes Coordinator Inpatient Diabetes Program [phone] (Team Pager) [phone] ([facility] office) [phone] [facility] office) [phone] [facility] office)

## 2016-01-06 NOTE — ED Notes (Signed)
CBG 264; RN, MD notified

## 2016-01-06 NOTE — ED Triage Notes (Signed)
Per pt and family member, pt woke up this morning complaining of severe headache. Reports having high bp and family states he "just hasn't been acting himself today." family states that this is how pt presented with MI in December. Denies any cp or sob. ekg done at triage.

## 2016-01-06 NOTE — ED Provider Notes (Signed)
[facility] DEPT Provider Note   CSN: [id number] Arrival date & time: 01/06/16  0805     History   Chief Complaint Chief Complaint  Patient presents with  . Hypertension  . Altered Mental Status    HPI Miguel Vazquez is a 75 y.o. male hx of DM, CAD s/p CABG, HTN, HL, AAA s/p repair here with headache. Sudden onset of severe frontal headaches around 2:30 am. Pain is constant. States that this morning he started vomiting. Had similar symptoms last year when he had MI. Denies any chest pain currently. Denies weakness or numbness or trouble speaking. BP was 190/100 at home. As per the family, he seemed confused and had repetitive speech.    The history is provided by the patient.    Past Medical History:  Diagnosis Date  . Arthritis   . Cataract   . Coronary artery disease   . Diabetes mellitus   . GERD (gastroesophageal reflux disease)   . Hx of abdominal aortic aneurysm 1998  . Hyperlipidemia   . Hypertension   . Personal history of colonic polyps - adenomas 09/09/2013    Patient Active Problem List   Diagnosis Date Noted  . CAD (coronary artery disease) of artery bypass graft 12/22/2015  . Chronic renal disease, stage III 04/06/2015  . 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 Cardiomyopathy, 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  . Scrotal inguinal hernia s/p lap repair w mesh 03/15/2015 03/21/2015  . STEMI 03/18/15 03/18/2015  . 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 Personal history of colonic polyps - adenomas 09/09/2013  . Obesity (BMI 30-39.9) 11/18/2012  . History of AAA (abdominal aortic aneurysm) repair 11/18/2012  . BACK PAIN, UPPER 08/10/2009  . Type 2 diabetes mellitus with renal manifestations ([place]) 10/15/2008  . Hyperlipidemia 10/15/2008  . Essential hypertension 10/15/2008    Past Surgical History:  Procedure Laterality Date  . ABDOMINAL AORTIC ANEURYSM REPAIR  1998  . CARDIAC CATHETERIZATION N/A 03/18/2015   Procedure: Left Heart Cath and Coronary Angiography;  Surgeon: Peter  M Martinique, MD;  Location: [facility];  Service: Cardiovascular;  Laterality: N/A;  . COLONOSCOPY    . CORONARY ARTERY BYPASS GRAFT N/A 03/18/2015   Procedure: CORONARY ARTERY BYPASS GRAFTING (CABG) x  three, using left internal mammary artery and right leg greater saphenous vein harvested endoscopically;  Surgeon: Melrose Nakayama, MD;  Location: [facility];  Service: Open Heart Surgery;  Laterality: N/A;  . INTRAOPERATIVE TRANSESOPHAGEAL ECHOCARDIOGRAM N/A 03/18/2015   Procedure: INTRAOPERATIVE TRANSESOPHAGEAL ECHOCARDIOGRAM;  Surgeon: Melrose Nakayama, MD;  Location: [facility];  Service: Open Heart Surgery;  Laterality: N/A;  . [place] Medications    Prior to Admission medications   Medication Sig Start Date End Date Taking? Authorizing Provider  aspirin EC 81 MG tablet Take 1 tablet (81 mg total) by mouth daily. 09/02/15  Yes Peter M Martinique, MD  atorvastatin (LIPITOR) 40 MG tablet TAKE 1 TABLET (40 MG TOTAL) BY MOUTH DAILY. 11/10/15  Yes Eulas Post, MD  carvedilol (COREG) 6.25 MG tablet Take 1 tablet (6.25 mg total) by mouth 2 (two) times daily. 07/01/15  Yes Peter M Martinique, MD  DUREZOL 0.05 % EMUL Place 1 drop into the right eye daily. 12/21/15  Yes Historical Provider, MD  glucose blood (BAYER CONTOUR NEXT TEST) test strip Test blood sugars as needed 03/30/15  Yes Eulas Post, MD  ibuprofen (ADVIL,MOTRIN) 200 MG tablet Take 200 mg by  mouth every 6 (six) hours as needed for moderate pain.   Yes Historical Provider, MD  KRILL OIL PO Take 1 capsule by mouth daily.    Yes Historical Provider, MD  lisinopril (PRINIVIL,ZESTRIL) 10 MG tablet Take 1 tablet (10 mg total) by mouth daily. 12/30/15  Yes Peter M Martinique, MD  metFORMIN (GLUCOPHAGE) 500 MG tablet TAKE 2 TABLETS BY MOUTH TWICE A DAY Patient taking differently: TAKE 1 TABLET BY MOUTH ONCE DAILY 09/28/15  Yes Eulas Post, MD  Multiple Vitamin (MULTIVITAMIN) tablet Take 1 tablet by mouth  daily. Centrium Silver once daily    Yes Historical Provider, MD  naproxen sodium (ANAPROX) 220 MG tablet Take 220 mg by mouth 2 (two) times daily as needed (pain).   Yes Historical Provider, MD  PROLENSA 0.07 % SOLN Place 1 drop into the right eye daily. 12/21/15  Yes Historical Provider, MD    Family History Family History  Problem Relation Age of Onset  . Diabetes Paternal Aunt   . Colon cancer Neg Hx     Social History Social History  Substance Use Topics  . Smoking status: Former Smoker    Packs/day: 1.50    Years: 20.00    Types: Cigarettes    Quit date: 08/24/1986  . Smokeless tobacco: Never Used  . Alcohol use 4.2 oz/week    7 Glasses of wine per week     Allergies   Ace inhibitors   Review of Systems Review of Systems  Gastrointestinal: Positive for vomiting.  Neurological: Positive for headaches.  All other systems reviewed and are negative.    Physical Exam Updated Vital Signs BP 145/92   Pulse 65   Temp 97.4 F (36.3 C) (Oral)   Resp 20   SpO2 100%   Physical Exam  Constitutional: He is oriented to person, place, and time.  Uncomfortable   HENT:  Head: Normocephalic.  Eyes: Pupils are equal, round, and reactive to light.  Neck: Normal range of motion. Neck supple.  Cardiovascular: Normal rate, regular rhythm and normal heart sounds.   Pulmonary/Chest: Effort normal and breath sounds normal. No respiratory distress. He has no wheezes. He has no rales.  Abdominal: Soft. Bowel sounds are normal. He exhibits no distension. There is no tenderness. There is no guarding.  Musculoskeletal: Normal range of motion.  Neurological: He is alert and oriented to person, place, and time.  CN 2- 12 intact. No speech deficit. Nl strength throughout. Nl finger to nose   Skin: Skin is warm.  Psychiatric: He has a normal mood and affect.  Nursing note and vitals reviewed.    ED Treatments / Results  Labs (all labs ordered are listed, but only abnormal results  are displayed) Labs Reviewed  CBC - Abnormal; Notable for the following:       Result Value   WBC 13.5 (*)    Platelets 149 (*)    All other components within normal limits  COMPREHENSIVE METABOLIC PANEL - Abnormal; Notable for the following:    Chloride 99 (*)    Glucose, Bld 240 (*)    All other components within normal limits  I-STAT CHEM 8, ED - Abnormal; Notable for the following:    Sodium 134 (*)    Potassium 7.5 (*)    Chloride 100 (*)    BUN 25 (*)    Glucose, Bld 248 (*)    Calcium, Ion 1.11 (*)    All other components within normal limits  CBG MONITORING, ED -  Abnormal; Notable for the following:    Glucose-Capillary 264 (*)    All other components within normal limits  I-STAT TROPOININ, ED    EKG  EKG Interpretation  Date/Time:  Friday January 06 2016 08:20:57 EDT Ventricular Rate:  62 PR Interval:  164 QRS Duration: 102 QT Interval:  421 QTC Calculation: 428 R Axis:   1 Text Interpretation:  Sinus rhythm Ventricular trigeminy Repol abnrm suggests ischemia, inferior leads TWI unchanged since earlier in the day  Confirmed by Sesilia Poucher  MD, Latamara Melder ([id number]) on 01/06/2016 8:28:43 AM       Radiology Ct Angio Head W Or Wo Contrast  Result Date: 01/06/2016 CLINICAL DATA:  75 year old male who awoke with frontal headache nausea vomiting. Hypertension. Initial encounter. EXAM: CT ANGIOGRAPHY HEAD TECHNIQUE: Multidetector CT imaging of the head was performed using the standard protocol during bolus administration of intravenous contrast. Multiplanar CT image reconstructions and MIPs were obtained to evaluate the vascular anatomy. CONTRAST:  50 mL Isovue 370. COMPARISON:  None. FINDINGS: CT HEAD Brain: Patchy bilateral cerebral white matter and deep gray matter hypodensity. Cerebral volume loss, appears fairly generalized. Mild ventricular prominence likely is ex vacuo related. No midline shift, mass effect, or evidence of intracranial mass lesion. No acute intracranial hemorrhage  identified. No cortically based acute infarct identified. There is a small dystrophic calcification adjacent to the right fornix column which appears inconsequential. Vascular: Extensive Calcified atherosclerosis at the skull base. Skull: Intact.  No acute osseous abnormality identified. Sinuses: Visualized paranasal sinuses and mastoids are well pneumatized. Orbits: No acute orbit or scalp soft tissue findings. CTA HEAD Posterior circulation: Dominant and dolichoectatic distal right vertebral artery. 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 Fairly extensive left greater than right distal vertebral artery calcified atherosclerosis, but no hemodynamically significant stenosis results. The left PICA is patent. Tortuous vertebrobasilar junction with calcified plaque at the left vertebrobasilar junction which does appear hemodynamically significant. Fusiform enlargement of the proximal basilar artery measuring 8-9 mm diameter. Tortuous and calcified proximal basilar artery with atherosclerotic stenosis estimated at 60 % with respect to the distal vessel (series 12, image 111). Widely patent basilar tip. Normal SCA and PCA origins. Tortuous right P1 segment. Posterior communicating arteries are diminutive or absent. Bilateral PCA branches are normal aside from tortuosity. Anterior circulation: Tortuous distal cervical ICAs. Both ICA siphons are patent. There is severe calcified atherosclerosis of the right siphon, moderate on the left. Superimposed bilateral siphon tortuosity. There is no significant siphon stenosis on the left. There is moderate siphon stenosis in the cavernous and supraclinoid segments on the right. Normal ophthalmic artery origins. Patent carotid termini. Normal MCA and ACA origins. Tortuous A1 and proximal A2 segments. Normal anterior communicating artery. Bilateral  ACA branches are normal aside from tortuosity. Left MCA M1 segment and left MCA branches are normal aside from tortuosity. Right MCA M1 segment and right MCA branches are normal aside from tortuosity. Venous sinuses: Patent. Anatomic variants: Dominant distal right vertebral artery. Delayed phase: No abnormal enhancement identified. IMPRESSION: 1. No acute intracranial abnormality. Negative for intracranial saccular aneurysm or large vessel occlusion. 2. 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 3. Extensive calcified intracranial atherosclerosis. Hemodynamically significant (moderate) appearing stenoses of the right ICA siphon (cavernous and supraclinoid segments) and proximal basilar  artery. 4. 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 Electronically Signed   By: Genevie Ann M.D.   On: 01/06/2016 09:28   Dg Chest Portable 1 View  Result Date: 01/06/2016 CLINICAL DATA:  Headache, confusion.  History of hypertension. EXAM: PORTABLE CHEST 1 VIEW COMPARISON:  03/05/2016 FINDINGS: Prior CABG. Low lung volumes. Minimal bibasilar atelectasis. Heart is normal size. No effusions or acute bony abnormality. IMPRESSION: Low lung volumes, bibasilar atelectasis. Electronically Signed   By: Rolm Baptise M.D.   On: 01/06/2016 09:30    Procedures Procedures (including critical care time)  CRITICAL CARE Performed by: Wandra Arthurs   Total critical care time: 30 minutes  Critical care time was exclusive of separately billable procedures and treating other patients.  Critical care was necessary to treat or prevent imminent or life-threatening deterioration.  Critical care was time spent personally by me on the following activities: development of treatment plan with patient and/or surrogate as  well as nursing, discussions with consultants, evaluation of patient's response to treatment, examination of patient, obtaining history from patient or surrogate, ordering and performing treatments and interventions, ordering and review of laboratory studies, ordering and review of radiographic studies, pulse oximetry and re-evaluation of patient's condition.   Medications Ordered in ED Medications  metoCLOPramide (REGLAN) injection 10 mg (10 mg Intravenous Given 01/06/16 0833)  hydrALAZINE (APRESOLINE) injection 5 mg (5 mg Intravenous Given 01/06/16 0834)  iopamidol (ISOVUE-370) 76 % injection (50 mLs  Contrast Given 01/06/16 0850)     Initial Impression / Assessment and Plan / ED Course  I have reviewed the triage vital signs and the nursing notes.  Pertinent labs & imaging results that were available during my care of the patient were reviewed by me and considered in my medical decision making (see chart for details).  Clinical Course    HENSEL CRILLEY is a 75 y.o. male here with headache, hypertension. Concerned for possible PRES vs hypertensive urgency vs aneurysm rupture. Will get labs, CT angio head. Will give migraine cocktail and BP meds.  10:39 AM BP improved to 140s after hydralazine. Headache improved. Labs hemolyzed but repeat showed nl K. CT showed aneurysm with no rupture. PRES vs hypertensive urgency still possible. Will admit.       Final Clinical Impressions(s) / ED Diagnoses   Final diagnoses:  None    New Prescriptions New Prescriptions   No medications on file     Drenda Freeze, MD 01/06/16 1040

## 2016-01-06 NOTE — ED Notes (Addendum)
Darl Householder MD made aware of K+.

## 2016-01-07 DIAGNOSIS — I25810 Atherosclerosis of coronary artery bypass graft(s) without angina pectoris: Secondary | ICD-10-CM | POA: Diagnosis not present

## 2016-01-07 DIAGNOSIS — I160 Hypertensive urgency: Secondary | ICD-10-CM

## 2016-01-07 DIAGNOSIS — I671 Cerebral aneurysm, nonruptured: Secondary | ICD-10-CM | POA: Diagnosis not present

## 2016-01-07 DIAGNOSIS — N183 Chronic kidney disease, stage 3 (moderate): Secondary | ICD-10-CM | POA: Diagnosis not present

## 2016-01-07 LAB — CBC
HCT: 45.9 % (ref 39.0–52.0)
Hemoglobin: 15.4 g/dL (ref 13.0–17.0)
MCH: 32.5 pg (ref 26.0–34.0)
MCHC: 33.6 g/dL (ref 30.0–36.0)
MCV: 96.8 fL (ref 78.0–100.0)
PLATELETS: 148 10*3/uL — AB (ref 150–400)
RBC: 4.74 MIL/uL (ref 4.22–5.81)
RDW: 13.8 % (ref 11.5–15.5)
WBC: 7.2 10*3/uL (ref 4.0–10.5)

## 2016-01-07 LAB — BASIC METABOLIC PANEL
Anion gap: 10 (ref 5–15)
BUN: 14 mg/dL (ref 6–20)
CHLORIDE: 102 mmol/L (ref 101–111)
CO2: 27 mmol/L (ref 22–32)
CREATININE: 0.96 mg/dL (ref 0.61–1.24)
Calcium: 9.8 mg/dL (ref 8.9–10.3)
GFR calc Af Amer: >60 mL/min (ref >60)
GFR calc non Af Amer: >60 mL/min (ref >60)
Glucose, Bld: 133 mg/dL — ABNORMAL HIGH (ref 65–99)
Potassium: 4.1 mmol/L (ref 3.5–5.1)
Sodium: 139 mmol/L (ref 135–145)

## 2016-01-07 LAB — GLUCOSE, CAPILLARY: Glucose-Capillary: 160 mg/dL — ABNORMAL HIGH (ref 65–99)

## 2016-01-07 NOTE — Evaluation (Signed)
Physical Therapy Evaluation/ Discharge Patient Details Name: Miguel Vazquez MRN: [id number] DOB: 24-Jul-1940 Today's Date: 01/07/2016   History of Present Illness  75 yo admitted with HA and vomiting found to have fusiform aneurysms. PMHx: CABG, HTN, DM, CAD, AAA repair  Clinical Impression  Pt very pleasant sitting EOB on arrival. Pt reports no falls in the last year, no HA and that he is feeling back to baseline. Pt demonstrated bil LE and UE strength 5/5 with intact sensation, no LOB with gait or stairs and no difficulty with head turns or change of speed. Pt currently at baseline without further therapy needs, will sign off with pt aware and agreeable.     Follow Up Recommendations No PT follow up    Equipment Recommendations  None recommended by PT    Recommendations for Other Services       Precautions / Restrictions Precautions Precautions: None      Mobility  Bed Mobility Overal bed mobility: Independent                Transfers Overall transfer level: Independent                  Ambulation/Gait Ambulation/Gait assistance: Independent Ambulation Distance (Feet): 400 Feet Assistive device: None Gait Pattern/deviations: WFL(Within Functional Limits)   Gait velocity interpretation: at or above normal speed for age/gender    Stairs Stairs: Yes Stairs assistance: Modified independent (Device/Increase time) Stair Management: One rail Left;Alternating pattern;Forwards Number of Stairs: 5    Wheelchair Mobility    Modified Rankin (Stroke Patients Only)       Balance Overall balance assessment: No apparent balance deficits (not formally assessed)                                           Pertinent Vitals/Pain Pain Assessment: No/denies pain    Home Living Family/patient expects to be discharged to:: Private residence Living Arrangements: Spouse/significant other;Children Available Help at Discharge: Family;Available 24  hours/day Type of Home: House Home Access: Stairs to enter Entrance Stairs-Rails: Psychiatric nurse of Steps: 4 Home Layout: Multi-level Home Equipment: Grab bars - toilet;Grab bars - tub/shower;Walker - 2 wheels;Shower seat      Prior Function Level of Independence: Independent               Hand Dominance        Extremity/Trunk Assessment   Upper Extremity Assessment: Overall WFL for tasks assessed           Lower Extremity Assessment: Overall WFL for tasks assessed      Cervical / Trunk Assessment: Normal  Communication   Communication: No difficulties  Cognition Arousal/Alertness: Awake/alert Behavior During Therapy: WFL for tasks assessed/performed Overall Cognitive Status: Within Functional Limits for tasks assessed                      General Comments      Exercises     Assessment/Plan    PT Assessment Patent does not need any further PT services  PT Problem List            PT Treatment Interventions      PT Goals (Current goals can be found in the Care Plan section)  Acute Rehab PT Goals PT Goal Formulation: All assessment and education complete, DC therapy    Frequency     Barriers to  discharge        Co-evaluation               End of Session   Activity Tolerance: Patient tolerated treatment well Patient left: in bed;with call bell/phone within reach;with family/visitor present Nurse Communication: Mobility status    Functional Assessment Tool Used: clinical judgement Functional Limitation: Mobility: Walking and moving around Mobility: Walking and Moving Around Current Status [phone]): 0 percent impaired, limited or restricted Mobility: Walking and Moving Around Goal Status [phone]): 0 percent impaired, limited or restricted Mobility: Walking and Moving Around Discharge Status [phone]): 0 percent impaired, limited or restricted    Time: 1148-1200 PT Time Calculation (min) (ACUTE ONLY): 12  min   Charges:   PT Evaluation $PT Eval Low Complexity: 1 Procedure     PT G Codes:   PT G-Codes **NOT FOR INPATIENT CLASS** Functional Assessment Tool Used: clinical judgement Functional Limitation: Mobility: Walking and moving around Mobility: Walking and Moving Around Current Status [id number]): 0 percent impaired, limited or restricted Mobility: Walking and Moving Around Goal Status [id number]): 0 percent impaired, limited or restricted Mobility: Walking and Moving Around Discharge Status [id number]): 0 percent impaired, limited or restricted    Melford Aase 01/07/2016, 12:22 PM Elwyn Reach, [place]

## 2016-01-07 NOTE — Progress Notes (Signed)
D/C instructions discussed with pt and his wife. All questions answered and pt verbalized understanding.

## 2016-01-07 NOTE — Discharge Instructions (Signed)
Cerebral Aneurysm  An aneurysm is the bulging or ballooning out of part of the weakened wall of a vein or artery. An aneurysm in the vein or artery of the brain is called a brain aneurysm, or cerebral aneurysm.   Aneurysms are a risk to your health because they may leak or rupture. Once the aneurysm leaks or ruptures, bleeding occurs. If the bleeding occurs within the brain tissue, the condition is called an intracerebral hemorrhage. An intracerebral hemorrhage can result in a hemorrhagic stroke. If the bleeding occurs in the area between the brain and the thin tissues that cover the brain, the condition is called a subarachnoid hemorrhage. This increases the pressure on the brain and causes some areas of the brain to not get the necessary blood flow. The blood from the ruptured aneurysm collects and presses on the surrounding brain tissue. A subarachnoid hemorrhage can cause a stroke. A ruptured cerebral aneurysm is a medical emergency. This can cause permanent damage and loss of brain function.  CAUSES  A cerebral aneurysm is caused when a weakened part of the blood vessel expands. The blood vessel expands due to the constant pressure from the flow of blood through the weakened blood vessel. Usually the aneurysm expands slowly. As the weakened aneurysm expands, the walls of the aneurysm become weaker. Aneurysms may be associated with diseases that weaken and damage the walls of your blood vessels or blood vessels that develop abnormally. Some known causes for cerebral aneurysms are:  · Head trauma.  · Infection.  · Use of "recreational drugs" such as cocaine or amphetamines.  RISK FACTORS  People at risk for a cerebral aneurysm or hemorrhagic stroke usually have one or more risk factors, which include:  · Having high blood pressure (hypertension).  · Abusing alcohol.  · Having abnormal blood vessels present since birth.  · Having certain bleeding disorders, such as hemophilia, sickle cell disease, or liver  disease.  · Taking blood thinners (anticoagulants).  · Smoking.  · Having a family history of aneurysm.  SIGNS AND SYMPTOMS   The signs and symptoms of an unruptured cerebral aneurysm will partly depend on its size and rate of growth. A small, unchanging aneurysm generally does not produce symptoms. A larger aneurysm that is steadily growing can increase pressure on the brain or nerves. That increased pressure from the unruptured cerebral aneurysm can cause:  · A headache.  · Problems with your vision.  · Numbness or weakness in an arm or leg.  · Problems with memory.  · Problems speaking.  · Seizures.  If an aneurysm leaks or bursts, it can cause a stroke and be life-threatening. Symptoms may include:  · A sudden, severe headache with no known cause. The headache is often described as the worst headache ever experienced.  · Nausea or vomiting, especially when combined with other symptoms such as a headache.  · Sudden weakness or numbness of the face, arm, or leg, especially on one side of the body.  · Sudden trouble walking or difficulty moving arms or legs.  · Sudden confusion.  · Sudden personality changes.  · Trouble speaking (aphasia) or understanding.  · Difficulty swallowing.  · Sudden trouble seeing in one or both eyes.  · Double vision.  · Dizziness.  · Loss of balance or coordination.  · Intolerance to light.  · Stiff neck.  DIAGNOSIS   Your health care provider may use one of the following tests to diagnose your aneurysm:  · Computed tomographic angiography (  CTA). This test uses dye and a scanner to produce images of your blood vessels.  · Magnetic resonance angiography (MRA). This test uses an MRI machine to produce images of your blood vessels.  · Digital subtraction angiography (DSA). This test uses dye and X-rays to take images of your blood vessels. Your health care provider may use this test to help determine the best course of treatment.  TREATMENT   Unruptured Aneurysms  Treatment is complex when  an aneurysm is found and it is not causing problems. Treatment is very individualized, as each case is different. Many things must be considered, such as the size and exact location of your aneurysm, your age, your overall health, and your feelings and preferences. Small aneurysms in certain locations of the brain have a very low chance of bleeding or rupturing. These small aneurysms may not be treated. However, depending on the size and location of the aneurysm, treatments may be recommended and include:  · Coiling. During this procedure, a catheter is inserted and advanced through a blood vessel. Once the catheter reaches the aneurysm, tiny coils are used to block blood flow into the aneurysm.  · Surgical clipping. During surgery, a clip is placed at the base of the aneurysm. The clip prevents blood from continuing to enter the aneurysm.  · Flow diversion. This procedure is used to divert blood flow around the aneurysm.  Ruptured Aneurysms  Immediate emergency surgery may be needed to help prevent damage to the brain and to reduce the risk of rebleeding. Timing of treatment is an important factor in the prevention of complications. Successful early treatment of a ruptured aneurysm (within the first 3 days of a bleed) helps to prevent rebleeding and blood vessel spasm. In some cases, there may be a reason to treat later (10-14 days after a rupture). Many things are considered when making this decision, and each case is handled individually.  HOME CARE INSTRUCTIONS  · Take medicines only as instructed by your health care provider.  · Eat healthy foods. It is recommended that you eat 5 or more servings of fruits and vegetables each day. Foods may need to be a special consistency (soft or pureed), or small bites may need to be taken if you have had a ruptured aneurysm or stroke. Certain dietary changes may be advised to address high blood pressure, high cholesterol, diabetes, or obesity.    Food choices that are low  in salt (sodium), saturated fat, trans fat, and cholesterol are recommended to manage high blood pressure.    Food choices that are high in fiber and low in saturated fat, trans fat, and cholesterol are recommended to control cholesterol levels.    Controlling carbohydrate and sugar intake is recommended to manage diabetes.    Reducing calorie intake and making food choices that are low in sodium, saturated fat, trans fat, and cholesterol are recommended to manage obesity.  · Maintain a healthy weight.  · Stay physically active. It is recommended that you get at least 30 minutes of activity on most or all days.  · Do not smoke.  · Limit alcohol use. Moderate alcohol use is considered to be:    No more than 2 drinks each day for men.    No more than 1 drink each day for nonpregnant women.  · Stop drug abuse.  · A safe home environment is important to reduce the risk of falls. Your health care provider may arrange for specialists to evaluate your home. Having   grab bars in the bedroom and bathroom is often important. Your health care provider may arrange for special equipment to be used at home, such as raised toilets and a seat for the shower.  · Physical, occupational, and speech therapy. Ongoing therapy may be needed to maximize your recovery after a ruptured aneurysm or stroke. If you have been advised to use a walker or a cane, use it at all times. Be sure to keep your therapy appointments.  · Follow all instructions for follow-up with your health care provider. This is very important. This includes any referrals, physical therapy, rehabilitation, and laboratory tests. Proper follow-up may prevent an aneurysm rupture or a stroke.  SEEK IMMEDIATE MEDICAL CARE IF:  · You have a sudden, severe headache with no known cause.  · You have sudden nausea or vomiting with a severe headache.  · You have sudden weakness or numbness of the face, arm, or leg, especially on one side of the body.  · You have sudden trouble  walking or difficulty moving arms or legs.  · You have sudden confusion.  · You have trouble speaking or understanding.  · You have sudden trouble seeing in one or both eyes.  · You have a sudden loss of balance or coordination.  · You have a stiff neck.  · You have difficulty breathing.  · You have a partial or total loss of consciousness.  Any of these symptoms may represent a serious problem that is an emergency. Do not wait to see if the symptoms will go away. Get medical help at once. Call your local emergency services (911 in [place] Do not drive yourself to the hospital.     This information is not intended to replace advice given to you by your health care provider. Make sure you discuss any questions you have with your health care provider.     Document Released: 12/09/2001 Document Revised: 04/09/2014 Document Reviewed: 09/04/2012  Elsevier Interactive Patient Education ©2016 [employer]

## 2016-01-07 NOTE — Discharge Summary (Signed)
Physician Discharge Summary  Miguel Vazquez [id number] DOB: 11-09-1940 DOA: 01/06/2016  PCP: Eulas Post, MD  Admit date: 01/06/2016 Discharge date: 01/07/2016  Time spent: 45 minutes  Recommendations for Outpatient Follow-up:  Patient will be discharged to home.  Patient will need to follow up with primary care provider within one week of discharge.  Follow up with Dr. Kathyrn Sheriff, neurosurgeon, in 1-2 weeks.  Patient should continue medications as prescribed.  Patient should follow a heart healthy/carb modified diet.   Discharge Diagnoses:  Principal Problem:   Brain aneurysm Active Problems:   Type 2 diabetes mellitus with renal manifestations (HCC)   Hyperlipidemia   Essential hypertension   CAD (coronary artery disease) of artery bypass graft   Hypertensive urgency   Headache  Discharge Condition: stable  Diet recommendation: heart healthy/carb modified  Filed Weights   01/06/16 1342 01/07/16 0542  Weight: 77.5 kg (170 lb 14.4 oz) 76.6 kg (168 lb 12.8 oz)    History of present illness:  On 01/06/2016 by Dr. Carlisle Cater Miguel Vazquez is a 75 y.o. male past mental history significant for coronary artery disease, diabetes, high blood pressure and recent cataract surgery earlier this week. Per patient family  Is occurred all of a sudden overnight. Patient had severe headache. Unable to localize headache. No medications were taken for the headache.  Patient initially resistant to come into the emergency room. Family eventually able to convince patient to go to the emergency room for assistant. Patient ambulate under his own power towards his front door to leave his house and then vomited profusely. There were no episodes of vomiting after that. Patient was brought to the emergency room by POV. Denies recent head trauma.  Per patient's wife patient had no prior complaints of fever, chills, nausea, vomiting, diarrhea, rash. They do live in a heavily weighted area with lots of  ticks but they deny tick bite.  Hospital Course:  Brain aneurysm/Headache -Headache improved with administration of Reglan in the ER -CTA head: No acute cranial abnormality, negative for intracranial saccular aneurysm or large vessel occlusion. 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 Ronnald Ramp, neurosurgery, via phone today.  Recommended outpatient follow up with Dr. Kathyrn Sheriff and continuing aspirin (81mg  daily).   Acute encephalopathy -Resolved, possibly related to the above -CXR: no edema or consolidation -UA not collected, patient continues to forget to give a sample -Currently awake and alert, back to baseline as per wife at bedside. -Currently afebrile, no leukocytosis, infection unlikely   Cataract -Recent cataract surgery, continue eye drops  Essential hypertension -Continue lisinopril, Coreg  Diabetes mellitus, type II -Metformin initially held, may continue on discharge.  Hyperlipidemia -Continue statin and krill oil  Coronary artery disease -Continue aspirin, statin, coreg, lisinopril  Procedures: None  Consultations: Dr. Ronnald Ramp, neurosurgery, via phone  Discharge Exam: Vitals:   01/07/16 1013 01/07/16 1140  BP: 121/79 130/83  Pulse: 79 70  Resp: 18 18  Temp: 97.7 F (36.5 C) 97.8 F (36.6 C)     General: Well developed, well nourished, NAD, appears stated age  HEENT: NCAT,mucous membranes moist.  Cardiovascular: S1 S2 auscultated, RRR, no murmurs  Respiratory: Clear to auscultation bilaterally with equal chest rise  Abdomen: Soft, nontender, nondistended, + bowel sounds  Extremities: warm dry without cyanosis clubbing or edema  Neuro: AAOx3, nonfocal   Psych: Normal affect and demeanor, pleasant  Discharge Instructions Discharge Instructions  Discharge instructions    Complete by:  As directed    Patient  will be discharged to home.  Patient will need to follow up with primary care provider within one week of discharge.  Follow up with Dr. Ralene Ok, neurosurgeon, in 1-2 weeks.  Patient should continue medications as prescribed.  Patient should follow a heart healthy/carb modified diet. Would not drive until seen by your primary care physician or neurosurgeon.     Current Discharge Medication List    CONTINUE these medications which have NOT CHANGED   Details  aspirin EC 81 MG tablet Take 1 tablet (81 mg total) by mouth daily.    atorvastatin (LIPITOR) 40 MG tablet TAKE 1 TABLET (40 MG TOTAL) BY MOUTH DAILY. Qty: 90 tablet, Refills: 1    carvedilol (COREG) 6.25 MG tablet Take 1 tablet (6.25 mg total) by mouth 2 (two) times daily. Qty: 180 tablet, Refills: 3    DUREZOL 0.05 % EMUL Place 1 drop into the right eye daily.    glucose blood (BAYER CONTOUR NEXT TEST) test strip Test blood sugars as needed Qty: 100 each, Refills: 4    ibuprofen (ADVIL,MOTRIN) 200 MG tablet Take 200 mg by mouth every 6 (six) hours as needed for moderate pain.    KRILL OIL PO Take 1 capsule by mouth daily.     lisinopril (PRINIVIL,ZESTRIL) 10 MG tablet Take 1 tablet (10 mg total) by mouth daily. Qty: 90 tablet, Refills: 3    metFORMIN (GLUCOPHAGE) 500 MG tablet TAKE 2 TABLETS BY MOUTH TWICE A DAY Qty: 120 tablet, Refills: 1    Multiple Vitamin (MULTIVITAMIN) tablet Take 1 tablet by mouth daily. Centrium Silver once daily     naproxen sodium (ANAPROX) 220 MG tablet Take 220 mg by mouth 2 (two) times daily as needed (pain).    PROLENSA 0.07 % SOLN Place 1 drop into the right eye daily. Refills: 0       No Known Allergies Follow-up Information    Eulas Post, MD. Schedule an appointment as soon as possible for a visit in 1 week(s).   Specialty:  Family Medicine Why:  Hospital follow up, blood pressure management Contact information: [place] [address] [phone]          Consuella Lose, C, MD. Schedule an appointment as soon as possible for a visit in 1 week(s).   Specialty:  Neurosurgery Why:  Hospital follow up, brain aneurysm  Contact information: [address] [place] [address] [phone]            The results of significant diagnostics from this hospitalization (including imaging, microbiology, ancillary and laboratory) are listed below for reference.    Significant Diagnostic Studies: Ct Angio Head W Or Wo Contrast  Result Date: 01/06/2016 CLINICAL DATA:  75 year old male who awoke with frontal headache nausea vomiting. Hypertension. Initial encounter. EXAM: CT ANGIOGRAPHY HEAD TECHNIQUE: Multidetector CT imaging of the head was performed using the standard protocol during bolus administration of intravenous contrast. Multiplanar CT image reconstructions and MIPs were obtained to evaluate the vascular anatomy. CONTRAST:  50 mL Isovue 370. COMPARISON:  None. FINDINGS: CT HEAD Brain: Patchy bilateral cerebral white matter and deep gray matter hypodensity. Cerebral volume loss, appears fairly generalized. Mild ventricular prominence likely is ex vacuo related. No midline shift, mass effect, or evidence of intracranial mass lesion. No acute intracranial hemorrhage identified. No cortically based acute infarct identified. There is a small dystrophic calcification adjacent to the right fornix  column which appears inconsequential. Vascular: Extensive Calcified atherosclerosis at the skull base. Skull: Intact.  No acute osseous abnormality identified. Sinuses: Visualized paranasal sinuses and mastoids are well pneumatized. Orbits: No acute orbit or scalp soft tissue findings. CTA HEAD Posterior circulation: Dominant and dolichoectatic distal right vertebral artery. There is fusiform aneurysmal enlargement of the distal right vertebral up to 8 mm diameter (series 11, image 139) in the region of the right PICA origin which remains  patent. Fairly extensive left greater than right distal vertebral artery calcified atherosclerosis, but no hemodynamically significant stenosis results. The left PICA is patent. Tortuous vertebrobasilar junction with calcified plaque at the left vertebrobasilar junction which does appear hemodynamically significant. Fusiform enlargement of the proximal basilar artery measuring 8-9 mm diameter. Tortuous and calcified proximal basilar artery with atherosclerotic stenosis estimated at 60 % with respect to the distal vessel (series 12, image 111). Widely patent basilar tip. Normal SCA and PCA origins. Tortuous right P1 segment. Posterior communicating arteries are diminutive or absent. Bilateral PCA branches are normal aside from tortuosity. Anterior circulation: Tortuous distal cervical ICAs. Both ICA siphons are patent. There is severe calcified atherosclerosis of the right siphon, moderate on the left. Superimposed bilateral siphon tortuosity. There is no significant siphon stenosis on the left. There is moderate siphon stenosis in the cavernous and supraclinoid segments on the right. Normal ophthalmic artery origins. Patent carotid termini. Normal MCA and ACA origins. Tortuous A1 and proximal A2 segments. Normal anterior communicating artery. Bilateral ACA branches are normal aside from tortuosity. Left MCA M1 segment and left MCA branches are normal aside from tortuosity. Right MCA M1 segment and right MCA branches are normal aside from tortuosity. Venous sinuses: Patent. Anatomic variants: Dominant distal right vertebral artery. Delayed phase: No abnormal enhancement identified. IMPRESSION: 1. No acute intracranial abnormality. Negative for intracranial saccular aneurysm or large vessel occlusion. 2. 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 3. Extensive calcified intracranial  atherosclerosis. Hemodynamically significant (moderate) appearing stenoses of the right ICA siphon (cavernous and supraclinoid segments) and proximal basilar artery. 4. 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 Electronically Signed   By: Genevie Ann M.D.   On: 01/06/2016 09:28   Dg Chest 2 View  Result Date: 01/06/2016 CLINICAL DATA:  Headaches.  Hypertension. EXAM: CHEST  2 VIEW COMPARISON:  January 06, 2016 study obtained earlier in the day FINDINGS: There is no edema or consolidation. The heart size and pulmonary vascularity are normal. Patient is status post coronary artery bypass grafting. There is aortic atherosclerosis. There is extensive native coronary artery calcification. No adenopathy. No bone lesions. IMPRESSION: No edema or consolidation.  Aortic atherosclerosis. Electronically Signed   By: Lowella Grip III M.D.   On: 01/06/2016 15:07   Dg Chest Portable 1 View  Result Date: 01/06/2016 CLINICAL DATA:  Headache, confusion.  History of hypertension. EXAM: PORTABLE CHEST 1 VIEW COMPARISON:  03/05/2016 FINDINGS: Prior CABG. Low lung volumes. Minimal bibasilar atelectasis. Heart is normal size. No effusions or acute bony abnormality. IMPRESSION: Low lung volumes, bibasilar atelectasis. Electronically Signed   By: Rolm Baptise M.D.   On: 01/06/2016 09:30    Microbiology: No results found for this or any previous visit (from the past 240 hour(s)).   Labs: Basic Metabolic Panel:  Recent Labs Lab 01/06/16 0838 01/06/16 0924 01/06/16 1459 01/07/16 0308  NA 134*  136  --  139  K 7.5* 4.5  --  4.1  CL 100* 99*  --  102  CO2  --  29  --  27  GLUCOSE 248* 240*  --  133*  BUN 25* 14  --  14  CREATININE 0.90 0.97  --  0.96  CALCIUM  --  9.8  --  9.8  MG  --   --  1.5*  --   PHOS  --   --  2.9  --    Liver Function Tests:  Recent Labs Lab 01/06/16 0924  AST 22  ALT 20  ALKPHOS 69    BILITOT 0.9  PROT 6.8  ALBUMIN 3.8   No results for input(s): LIPASE, AMYLASE in the last 168 hours. No results for input(s): AMMONIA in the last 168 hours. CBC:  Recent Labs Lab 01/06/16 0813 01/06/16 0838 01/07/16 0308  WBC 13.5*  --  7.2  HGB 15.0 15.6 15.4  HCT 44.0 46.0 45.9  MCV 97.3  --  96.8  PLT 149*  --  148*   Cardiac Enzymes: No results for input(s): CKTOTAL, CKMB, CKMBINDEX, TROPONINI in the last 168 hours. BNP: BNP (last 3 results) No results for input(s): BNP in the last 8760 hours.  ProBNP (last 3 results) No results for input(s): PROBNP in the last 8760 hours.  CBG:  Recent Labs Lab 01/06/16 0824 01/06/16 1641 01/06/16 2135 01/07/16 0615  GLUCAP 264* 185* 104* 160*       Signed:  Kalliope Riesen  [facility] 01/07/2016, 11:55 AM

## 2016-01-09 ENCOUNTER — Other Ambulatory Visit: Payer: Self-pay | Admitting: Family Medicine

## 2016-01-09 LAB — POCT I-STAT, CHEM 8
BUN: 25 mg/dL — AB (ref 6–20)
CALCIUM ION: 1.11 mmol/L — AB (ref 1.15–1.40)
CHLORIDE: 100 mmol/L — AB (ref 101–111)
CREATININE: 0.9 mg/dL (ref 0.61–1.24)
GLUCOSE: 248 mg/dL — AB (ref 65–99)
HCT: 46 % (ref 39.0–52.0)
Hemoglobin: 15.6 g/dL (ref 13.0–17.0)
POTASSIUM: 7.5 mmol/L — AB (ref 3.5–5.1)
Sodium: 134 mmol/L — ABNORMAL LOW (ref 135–145)
TCO2: 30 mmol/L (ref 0–100)

## 2016-01-09 MED ORDER — GLUCOSE BLOOD VI STRP: ORAL_STRIP | 4 refills | Status: DC

## 2016-01-09 MED ORDER — METFORMIN HCL 500 MG PO TABS: 1000.0000 mg | ORAL_TABLET | Freq: Two times a day (BID) | ORAL | 1 refills | Status: DC

## 2016-01-09 NOTE — Telephone Encounter (Signed)
Left message on voicemail Rx's sent to the pharmacy.

## 2016-01-09 NOTE — Telephone Encounter (Signed)
Pt need new Rx for Bayer test strips Contour next-ez, Metformin  Pharm:  [facility]  Pt was in the hospital for two aneurysm on the brian 8cm and went home on Saturday.

## 2016-01-10 ENCOUNTER — Other Ambulatory Visit: Payer: Self-pay

## 2016-01-10 ENCOUNTER — Telehealth: Payer: Self-pay | Admitting: Family Medicine

## 2016-01-10 MED ORDER — GLUCOSE BLOOD VI STRP: ORAL_STRIP | 5 refills | Status: DC

## 2016-01-10 MED ORDER — FREESTYLE LANCETS MISC: 5 refills | Status: DC

## 2016-01-10 NOTE — Telephone Encounter (Signed)
error 

## 2016-01-11 DIAGNOSIS — I672 Cerebral atherosclerosis: Secondary | ICD-10-CM | POA: Diagnosis not present

## 2016-01-11 DIAGNOSIS — I671 Cerebral aneurysm, nonruptured: Secondary | ICD-10-CM | POA: Diagnosis not present

## 2016-01-13 ENCOUNTER — Encounter: Payer: Self-pay | Admitting: Family Medicine

## 2016-01-13 ENCOUNTER — Ambulatory Visit (INDEPENDENT_AMBULATORY_CARE_PROVIDER_SITE_OTHER): Payer: PPO | Admitting: Family Medicine

## 2016-01-13 VITALS — BP 138/68 | HR 88 | Temp 97.5°F | Ht 68.0 in | Wt 172.5 lb

## 2016-01-13 DIAGNOSIS — R51 Headache: Secondary | ICD-10-CM | POA: Diagnosis not present

## 2016-01-13 DIAGNOSIS — I671 Cerebral aneurysm, nonruptured: Secondary | ICD-10-CM

## 2016-01-13 DIAGNOSIS — E1121 Type 2 diabetes mellitus with diabetic nephropathy: Secondary | ICD-10-CM | POA: Diagnosis not present

## 2016-01-13 DIAGNOSIS — I10 Essential (primary) hypertension: Secondary | ICD-10-CM

## 2016-01-13 DIAGNOSIS — E875 Hyperkalemia: Secondary | ICD-10-CM

## 2016-01-13 DIAGNOSIS — R519 Headache, unspecified: Secondary | ICD-10-CM

## 2016-01-13 NOTE — Progress Notes (Signed)
Pre visit review using our clinic review tool, if applicable. No additional management support is needed unless otherwise documented below in the visit note. 

## 2016-01-13 NOTE — Progress Notes (Signed)
Subjective:     Patient ID: Miguel Vazquez, male   DOB: [birth year], 75 y.o.   MRN: [id number]  HPI Patient seen for recent hospital follow-up. He was admitted on 01/06/2016 with severe headache. No history of migraine headaches. His past medical history is significant for CAD, type 2 diabetes, hypertension. He had recent cataract surgery. He woke up one night with severe general headache. He had one episode of vomiting at his house before coming to the hospital. No subsequent episodes of vomiting. No recent head trauma. Denied any fever or chills.  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 There was phone consultation with neurosurgeon and they recommended outpatient neurosurgical follow-up. He saw a neurosurgeon earlier this week and they did not suggest any surgery. It was suggested he follow-up with neurology and they have appointment with neurology next week. Patient had no further headaches whatsoever since discharge. He did have acute encephalopathy of unclear etiology. His labs were basically unremarkable.  Hypertension has been stable on lisinopril and Coreg. Type 2 diabetes on metformin. This was initially held but continued on discharge. His blood sugars been stable. He did have potassium initially of 7.5 which may have been hemolyzed as subsequent follow-up without intervention was normal. He does not take any potassium supplements.  Past Medical History:  Diagnosis Date  . Arthritis   . Cataract   . Coronary artery disease   . Diabetes mellitus   . GERD (gastroesophageal reflux disease)   . Hx of abdominal aortic aneurysm 1998  . Hyperlipidemia   . Hypertension   . Personal history of colonic polyps - adenomas 09/09/2013   Past Surgical History:  Procedure Laterality Date  . ABDOMINAL AORTIC ANEURYSM REPAIR  1998  . CARDIAC CATHETERIZATION N/A 03/18/2015    Procedure: Left Heart Cath and Coronary Angiography;  Surgeon: Peter M Martinique, MD;  Location: [facility];  Service: Cardiovascular;  Laterality: N/A;  . cataract surgery Right   . COLONOSCOPY    . CORONARY ARTERY BYPASS GRAFT N/A 03/18/2015   Procedure: CORONARY ARTERY BYPASS GRAFTING (CABG) x  three, using left internal mammary artery and right leg greater saphenous vein harvested endoscopically;  Surgeon: Melrose Nakayama, MD;  Location: [place];  Service: Open Heart Surgery;  Laterality: N/A;  . INGUINAL HERNIA REPAIR Bilateral   . INTRAOPERATIVE TRANSESOPHAGEAL ECHOCARDIOGRAM N/A 03/18/2015   Procedure: INTRAOPERATIVE TRANSESOPHAGEAL ECHOCARDIOGRAM;  Surgeon: Melrose Nakayama, MD;  Location: [place];  Service: Open Heart Surgery;  Laterality: N/A;  . [place]    reports that he quit smoking about 29 years ago. His smoking use included Cigarettes. He has a 30.00 pack-year smoking history. He has never used smokeless tobacco. He reports that he drinks about 4.2 oz of alcohol per week . He reports that he does not use drugs. family history includes Diabetes in his paternal aunt. No Known Allergies   Review of Systems  Constitutional: Negative for chills, fatigue and fever.  Eyes: Negative for visual disturbance.  Respiratory: Negative for cough, chest tightness and shortness of breath.   Cardiovascular: Negative for chest pain, palpitations and leg swelling.  Gastrointestinal: Negative for abdominal pain.  Genitourinary: Negative for dysuria.  Neurological: Negative for dizziness, syncope, weakness, light-headedness and headaches.  Psychiatric/Behavioral: Negative for confusion.       Objective:   Physical Exam  Constitutional: He is oriented to person, place,  and time. He appears well-developed and well-nourished.  HENT:  Mouth/Throat: Oropharynx is clear and moist.  Eyes: Pupils are equal, round, and reactive to light.  Neck: Neck supple.   Cardiovascular: Normal rate and regular rhythm.   Pulmonary/Chest: Effort normal and breath sounds normal. No respiratory distress. He has no wheezes. He has no rales.  Musculoskeletal: He exhibits no edema.  Neurological: He is alert and oriented to person, place, and time. He has normal reflexes. No cranial nerve deficit.       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

## 2016-01-14 LAB — HEMOGLOBIN A1C
Hgb A1c MFr Bld: 7.4 % — ABNORMAL HIGH (ref <5.7)
MEAN PLASMA GLUCOSE: 166 mg/dL

## 2016-01-14 LAB — BASIC METABOLIC PANEL
BUN: 17 mg/dL (ref 7–25)
CHLORIDE: 101 mmol/L (ref 98–110)
CO2: 23 mmol/L (ref 20–31)
CREATININE: 1.21 mg/dL — AB (ref 0.70–1.18)
Calcium: 9.3 mg/dL (ref 8.6–10.3)
Glucose, Bld: 162 mg/dL — ABNORMAL HIGH (ref 65–99)
POTASSIUM: 4 mmol/L (ref 3.5–5.3)
Sodium: 141 mmol/L (ref 135–146)

## 2016-01-18 ENCOUNTER — Ambulatory Visit (INDEPENDENT_AMBULATORY_CARE_PROVIDER_SITE_OTHER): Payer: PPO | Admitting: Neurology

## 2016-01-18 ENCOUNTER — Encounter: Payer: Self-pay | Admitting: Neurology

## 2016-01-18 VITALS — BP 141/85 | HR 66 | Ht 68.0 in | Wt 175.0 lb

## 2016-01-18 DIAGNOSIS — I10 Essential (primary) hypertension: Secondary | ICD-10-CM

## 2016-01-18 DIAGNOSIS — E785 Hyperlipidemia, unspecified: Secondary | ICD-10-CM | POA: Diagnosis not present

## 2016-01-18 DIAGNOSIS — I672 Cerebral atherosclerosis: Secondary | ICD-10-CM | POA: Diagnosis not present

## 2016-01-18 DIAGNOSIS — E1121 Type 2 diabetes mellitus with diabetic nephropathy: Secondary | ICD-10-CM

## 2016-01-18 MED ORDER — METFORMIN HCL 1000 MG PO TABS: 1000.0000 mg | ORAL_TABLET | Freq: Two times a day (BID) | ORAL | 3 refills | Status: DC

## 2016-01-18 MED ORDER — LISINOPRIL 20 MG PO TABS: 20.0000 mg | ORAL_TABLET | Freq: Every day | ORAL | 3 refills | Status: DC

## 2016-01-18 MED ORDER — ASPIRIN 325 MG PO TBEC: 325.0000 mg | DELAYED_RELEASE_TABLET | Freq: Every day | ORAL | Status: DC

## 2016-01-18 MED ORDER — ATORVASTATIN CALCIUM 80 MG PO TABS: 80.0000 mg | ORAL_TABLET | Freq: Every day | ORAL | 3 refills | Status: DC

## 2016-01-18 NOTE — Patient Instructions (Addendum)
-   increase ASA from 81mg  to 325mg  daily - increase lipitor from 40mg  to 80mg  daily - increase lisinopril from 10mg  to 20mg  daily - increase metformin from 500mg  to 1000mg  twice a day - check BP and glucose at home and record and bring over to PCP for medication adjustment if needed.  - BP gaol 120-140 - Follow up with your primary care physician for stroke risk factor modification. Recommend maintain blood pressure goal <140/80, diabetes with hemoglobin A1c goal below 7.0% and lipids with LDL cholesterol goal below 70 mg/dL.  - diabetic diet and regular exercise - no over exertion - follow up in 3 months

## 2016-01-18 NOTE — Progress Notes (Signed)
NEUROLOGY CLINIC NEW PATIENT NOTE  NAME: Miguel Vazquez DOB: 13-Jun-1940 REFERRING PHYSICIAN: Dr. Consuella Lose  I saw Miguel Vazquez as a new consult in the neurovascular clinic today regarding  Chief Complaint  Patient presents with  . Referral    Referral from [facility] for intracranial atherosclerosis  from Dr. Kathyrn Sheriff MD Pt was referred to Neurosurgery for aneurysm, and headache  .  HPI: Miguel Vazquez is a 75 y.o. male with PMH of CAD, DM, HLD, and HTN who presents as a new patient for intracranial atherosclerosis.   He was in his normal health until 01/06/16 he woke up with severe HA. His whole head hurt, 8/10 and was about to explode. Felt constant pain without throbbing feeling. Denies N/V, photophobia or phonophobia. Denies hx of migraine or HA. His BP in ER was very high at that time. He was admitted and BP controlled. His HA lasted about several hours and resolved. 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 He was then discharged with referral to Dr. Kathyrn Sheriff for aneurysm follow up. Dr. Kathyrn Sheriff saw pt and felt not candidate for intervention and referred to neurology for further evaluation.   He is on ASA and lipitor as home meds. He also takes metformin and glucose controlled well. hsi BP at home 130-140, today in clinic 141/85. Has quit smoking for 30 years, no alcohol or illicit drugs.   Past Medical History:  Diagnosis Date  . Arthritis   . Cataract   . Coronary artery disease   . Diabetes mellitus   . GERD (gastroesophageal reflux disease)   . Hx of abdominal aortic aneurysm 1998  . Hyperlipidemia   . Hypertension   . Personal history of colonic polyps - adenomas 09/09/2013   Past Surgical History:  Procedure Laterality Date  . ABDOMINAL AORTIC ANEURYSM REPAIR  1998  . CARDIAC CATHETERIZATION N/A 03/18/2015   Procedure: Left Heart Cath and Coronary Angiography;  Surgeon: Peter M Martinique,  MD;  Location: [place] [facility];  Service: Cardiovascular;  Laterality: N/A;  . cataract surgery Right   . COLONOSCOPY    . CORONARY ARTERY BYPASS GRAFT N/A 03/18/2015   Procedure: CORONARY ARTERY BYPASS GRAFTING (CABG) x  three, using left internal mammary artery and right leg greater saphenous vein harvested endoscopically;  Surgeon: Melrose Nakayama, MD;  Location: [place];  Service: Open Heart Surgery;  Laterality: N/A;  . INGUINAL HERNIA REPAIR Bilateral   . INTRAOPERATIVE TRANSESOPHAGEAL ECHOCARDIOGRAM N/A 03/18/2015   Procedure: INTRAOPERATIVE TRANSESOPHAGEAL ECHOCARDIOGRAM;  Surgeon: Melrose Nakayama, MD;  Location: [place];  Service: Open Heart Surgery;  Laterality: N/A;  . UMBILICAL HERNIA REPAIR  1965   Family History  Problem Relation Age of Onset  . Diabetes Paternal Aunt   . Colon cancer Neg Hx    Current Outpatient Prescriptions  Medication Sig Dispense Refill  . aspirin EC 325 MG EC tablet Take 1 tablet (325 mg total) by mouth daily.    Marland Kitchen atorvastatin (LIPITOR) 80 MG tablet Take 1 tablet (80 mg total) by mouth daily at 6 PM. 90 tablet 3  . carvedilol (COREG) 6.25 MG tablet Take 1 tablet (6.25 mg total) by mouth 2 (two) times daily. 180 tablet 3  . glucose blood (FREESTYLE LITE) test strip Check Blood Sugars 1-2 times per day. DX: E11.9. 100 each 5  . KRILL OIL PO Take 1 capsule by mouth daily.     . Lancets (FREESTYLE) lancets Check  Blood Sugars 1-2 times per day. DX: E11.9 100 each 5  . lisinopril (PRINIVIL,ZESTRIL) 20 MG tablet Take 1 tablet (20 mg total) by mouth daily. 90 tablet 3  . metFORMIN (GLUCOPHAGE) 1000 MG tablet Take 1 tablet (1,000 mg total) by mouth 2 (two) times daily. 180 tablet 3  . Multiple Vitamin (MULTIVITAMIN) tablet Take 1 tablet by mouth daily. Centrium Silver once daily      No current facility-administered medications for this visit.    No Known Allergies Social History   Social History  . Marital status: Married    Spouse name: [name]    . Number of children: 3  . Years of education: N/A   Occupational History  . Not on file.   Social History Main Topics  . Smoking status: Former Smoker    Packs/day: 1.50    Years: 20.00    Types: Cigarettes    Quit date: 08/24/1986  . Smokeless tobacco: Never Used  . Alcohol use 4.2 oz/week    7 Glasses of wine per week     Comment: occasionally  . Drug use: No  . Sexual activity: Not on file   Other Topics Concern  . Not on file   Social History Narrative  . No narrative on file    Review of Systems Full 14 system review of systems performed and notable only for those listed, all others are neg:  Constitutional:   Cardiovascular:  Ear/Nose/Throat:   Skin:  Eyes:   Respiratory:   Gastroitestinal:   Genitourinary: impotence Hematology/Lymphatic:   Endocrine:  Musculoskeletal:  Joint pain, aching muscles Allergy/Immunology:   Neurological:  HA Psychiatric:  Sleep: snoring   Physical Exam  Vitals:   01/18/16 0834  BP: (!) 141/85  Pulse: 66    General - Well nourished, well developed, in no apparent distress.  Ophthalmologic - Sharp disc margins OU.  Cardiovascular - Regular rate and rhythm with no murmur.    Neck - supple, no nuchal rigidity .  Mental Status -  Level of arousal and orientation to time, place, and person were intact. Language including expression, naming, repetition, comprehension, reading, and writing was assessed and found intact. Fund of Knowledge was assessed and was intact.  Cranial Nerves II - XII - II - Visual field intact OU. III, IV, VI - Extraocular movements intact. V - Facial sensation intact bilaterally. VII - Facial movement intact bilaterally. VIII - Hearing & vestibular intact bilaterally. X - Palate elevates symmetrically. XI - Chin turning & shoulder shrug intact bilaterally. XII - Tongue protrusion intact.  Motor Strength - The patient's strength was normal in all extremities and pronator drift was absent.   Bulk was normal and fasciculations were absent.   Motor Tone - Muscle tone was assessed at the neck and appendages and was normal.  Reflexes - The patient's reflexes were normal in all extremities and he had no pathological reflexes.  Sensory - Light touch, temperature/pinprick, vibration and proprioception, and Romberg testing were assessed and were normal.    Coordination - The patient had normal movements in the hands and feet with no ataxia or dysmetria.  Tremor was absent.  Gait and Station - The patient's transfers, posture, gait, station, and turns were observed as normal.   Imaging  I have personally reviewed the radiological images below and agree with the radiology interpretations.  Ct Angio Head W Or Wo Contrast 01/06/2016 IMPRESSION: 1. No acute intracranial abnormality. Negative for intracranial saccular aneurysm or large vessel occlusion. 2.  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 3. Extensive calcified intracranial atherosclerosis. Hemodynamically significant (moderate) appearing stenoses of the right ICA siphon (cavernous and supraclinoid segments) and proximal basilar artery. 4. 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   Lab Review Component     Latest Ref Rng & Units 07/15/2015 01/13/2016  Cholesterol     125 - 200 mg/dL 141   Triglycerides     <150 mg/dL 113   HDL Cholesterol     >=40 mg/dL 43   Total CHOL/HDL Ratio     <=5.0 Ratio 3.3   VLDL     <30 mg/dL 23   LDL (calc)     <130 mg/dL 75   Hemoglobin A1C     <5.7 %  7.4 (H)  Mean Plasma Glucose     mg/dL  166     Assessment and Plan:   In summary, Miguel Vazquez is a 75 y.o. male with PMH of CAD, DM, HLD, and HTN who presents as a new patient for intracranial atherosclerosis.  Developed severe HA on 01/06/16 likely due to significant high BP. 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 He was referred to neurology for further evaluation. He is on ASA and lipitor as home meds. A1C 7.4, LDL 75. Has quit smoking for 30 years.  - increase ASA from 81mg  to 325mg  daily for stroke prevention - increase lipitor from 40mg  to 80mg  daily for intracranial stenosis - increase lisinopril from 10mg  to 20mg  daily for better BP control - increase metformin from 500mg  to 1000mg  twice a day for better DM control - check BP and glucose at home and record and bring over to PCP for medication adjustment if needed.  - BP gaol 120-140 - Follow up with your primary care physician for stroke risk factor modification. Recommend maintain blood pressure goal <140/80, diabetes with hemoglobin A1c goal below 7.0% and lipids with LDL cholesterol goal below 70 mg/dL.  - follow up in 3 months  Thank you very much for the opportunity to participate in the care of this patient.  Please do not hesitate to call if any questions or concerns arise.  Orders Placed This Encounter  Procedures  . US Carotid Bilateral    Standing Status:   Future    Standing Expiration Date:   03/21/2017    Order Specific Question:   Reason for Exam (SYMPTOM  OR DIAGNOSIS REQUIRED)    Answer:   rule out carotid stenosis    Order Specific Question:   Preferred imaging location?    Answer:   Internal    Meds ordered this encounter  Medications  . aspirin EC 325 MG EC tablet    Sig: Take 1 tablet (325 mg total) by mouth daily.  . metFORMIN (GLUCOPHAGE) 1000 MG tablet    Sig: Take 1 tablet (1,000 mg total) by mouth 2 (two) times daily.    Dispense:  180 tablet    Refill:  3  . lisinopril (PRINIVIL,ZESTRIL) 20 MG tablet    Sig: Take 1 tablet (20 mg total) by mouth daily.    Dispense:  90 tablet    Refill:  3  . atorvastatin (LIPITOR) 80 MG tablet    Sig:  Take 1 tablet (80 mg total) by mouth daily at  6 PM.    Dispense:  90 tablet    Refill:  3    Need office visit to follow up on blood sugar.    Patient Instructions  - increase ASA from 81mg  to 325mg  daily - increase lipitor from 40mg  to 80mg  daily - increase lisinopril from 10mg  to 20mg  daily - increase metformin from 500mg  to 1000mg  twice a day - check BP and glucose at home and record and bring over to PCP for medication adjustment if needed.  - BP gaol 120-140 - Follow up with your primary care physician for stroke risk factor modification. Recommend maintain blood pressure goal <140/80, diabetes with hemoglobin A1c goal below 7.0% and lipids with LDL cholesterol goal below 70 mg/dL.  - diabetic diet and regular exercise - no over exertion - follow up in 3 months    Rosalin Hawking, MD PhD [facility] [address], [place], [place] [address] [phone]

## 2016-01-27 DIAGNOSIS — S43401A Unspecified sprain of right shoulder joint, initial encounter: Secondary | ICD-10-CM | POA: Diagnosis not present

## 2016-02-01 ENCOUNTER — Encounter: Payer: Self-pay | Admitting: Family Medicine

## 2016-02-01 ENCOUNTER — Ambulatory Visit (INDEPENDENT_AMBULATORY_CARE_PROVIDER_SITE_OTHER): Payer: PPO | Admitting: Family Medicine

## 2016-02-01 VITALS — BP 148/92 | HR 94 | Temp 98.2°F | Ht 68.0 in | Wt 167.2 lb

## 2016-02-01 DIAGNOSIS — M542 Cervicalgia: Secondary | ICD-10-CM

## 2016-02-01 DIAGNOSIS — M25511 Pain in right shoulder: Secondary | ICD-10-CM | POA: Diagnosis not present

## 2016-02-01 MED ORDER — HYDROCODONE-ACETAMINOPHEN 5-325 MG PO TABS: 1.0000 | ORAL_TABLET | Freq: Four times a day (QID) | ORAL | 0 refills | Status: DC | PRN

## 2016-02-01 NOTE — Progress Notes (Signed)
Subjective:     Patient ID: Miguel Vazquez, male   DOB: [birth year], 75 y.o.   MRN: [id number]  HPI Patient seen with neck pain following fall. One week ago today on Wednesday he was at a family celebration was sitting outside and there were several [place] around their table. He got up suddenly to try to avoid them and lost balance and fell back in his chair and apparently struck the back of his head on a merry-go-round. There was no loss of consciousness. He denied any headache. He did not actually have any neck pain till 2 days later but since that time was had pain around the base of the neck and also significant right shoulder pain. He went to a local urgent care couple days ago had x-rays of the right shoulder which showed degenerative changes right acromioclavicular joint. No fracture.  He complains some pain on the right shoulder region and also around the right elbow region but full range of motion. He has pain with extending the neck and decreased range of motion in the shoulder and neck at this time.  No headache. No confusion.  Past Medical History:  Diagnosis Date  . Arthritis   . Cataract   . Coronary artery disease   . Diabetes mellitus   . GERD (gastroesophageal reflux disease)   . Hx of abdominal aortic aneurysm 1998  . Hyperlipidemia   . Hypertension   . Personal history of colonic polyps - adenomas 09/09/2013   Past Surgical History:  Procedure Laterality Date  . ABDOMINAL AORTIC ANEURYSM REPAIR  1998  . CARDIAC CATHETERIZATION N/A 03/18/2015   Procedure: Left Heart Cath and Coronary Angiography;  Surgeon: Peter M Martinique, MD;  Location: [place] [facility];  Service: Cardiovascular;  Laterality: N/A;  . cataract surgery Right   . COLONOSCOPY    . CORONARY ARTERY BYPASS GRAFT N/A 03/18/2015   Procedure: CORONARY ARTERY BYPASS GRAFTING (CABG) x  three, using left internal mammary artery and right leg greater saphenous vein harvested endoscopically;  Surgeon:  Melrose Nakayama, MD;  Location: [place];  Service: Open Heart Surgery;  Laterality: N/A;  . INGUINAL HERNIA REPAIR Bilateral   . INTRAOPERATIVE TRANSESOPHAGEAL ECHOCARDIOGRAM N/A 03/18/2015   Procedure: INTRAOPERATIVE TRANSESOPHAGEAL ECHOCARDIOGRAM;  Surgeon: Melrose Nakayama, MD;  Location: [place];  Service: Open Heart Surgery;  Laterality: N/A;  . [place]    reports that he quit smoking about 29 years ago. His smoking use included Cigarettes. He has a 30.00 pack-year smoking history. He has never used smokeless tobacco. He reports that he drinks about 4.2 oz of alcohol per week . He reports that he does not use drugs. family history includes Diabetes in his paternal aunt. No Known Allergies   Review of Systems  Constitutional: Negative for chills and fever.  Eyes: Negative for visual disturbance.  Respiratory: Negative for shortness of breath.   Cardiovascular: Negative for chest pain.  Musculoskeletal: Positive for neck pain and neck stiffness.  Neurological: Negative for dizziness, seizures, weakness, numbness and headaches.  Psychiatric/Behavioral: Negative for confusion.       Objective:   Physical Exam  Constitutional: He is oriented to person, place, and time. He appears well-developed and well-nourished.  HENT:  Head: Normocephalic and atraumatic.  Right Ear: External ear normal.  Left Ear: External ear normal.  Eyes: Pupils are equal, round, and reactive to light.  Neck: Neck supple.  Cardiovascular: Normal rate and regular rhythm.   Pulmonary/Chest: Effort  normal and breath sounds normal. No respiratory distress. He has no wheezes. He has no rales.  Musculoskeletal: He exhibits no edema.  Pt sitting with somewhat of a flexed cervical posture. No spinal tenderness.  Limited ROM with lateral bending, rotation, or flexion/extension.  Right shoulder no visible swelling or ecchymosis.  Neurological: He is alert and oriented to person, place, and  time. No cranial nerve deficit.       Assessment:     #1 lower cervical neck pain following fall.  ?soft tissue muscle spasm  No spinal tenderness but needs x-rays to further assess and rule out fracture.    #2 right shoulder pain and possible weakness following fall    Plan:     -Cervical spine films to further evaluate neck pain -May need further evaluation right shoulder to rule out rotator cuff tear -limited Vicodin 5 mg one every 6 hours prn severe pain.  Miguel Post MD [facility]

## 2016-02-02 ENCOUNTER — Ambulatory Visit (INDEPENDENT_AMBULATORY_CARE_PROVIDER_SITE_OTHER)
Admission: RE | Admit: 2016-02-02 | Discharge: 2016-02-02 | Disposition: A | Discharge status: Home / Self Care | Payer: PPO | Source: Ambulatory Visit | Attending: Family Medicine | Admitting: Family Medicine

## 2016-02-02 DIAGNOSIS — M542 Cervicalgia: Secondary | ICD-10-CM

## 2016-02-02 DIAGNOSIS — M47812 Spondylosis without myelopathy or radiculopathy, cervical region: Secondary | ICD-10-CM | POA: Diagnosis not present

## 2016-02-02 IMAGING — DX DG CERVICAL SPINE COMPLETE 4+V
6 series · 6 of 6 positions shown · non-contrast
Comparison: None.

CLINICAL DATA: Cervicalgia

EXAM:
CERVICAL SPINE - COMPLETE 4+ VIEW

[c-spine lat]
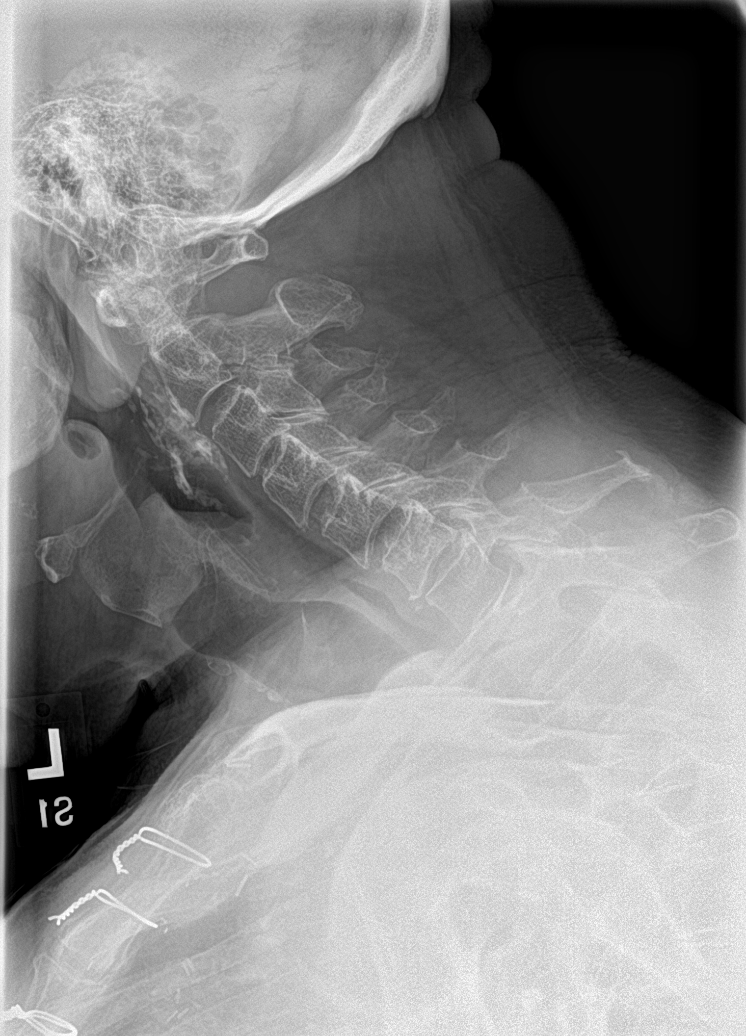

[c-spine obl (1 of 2)]
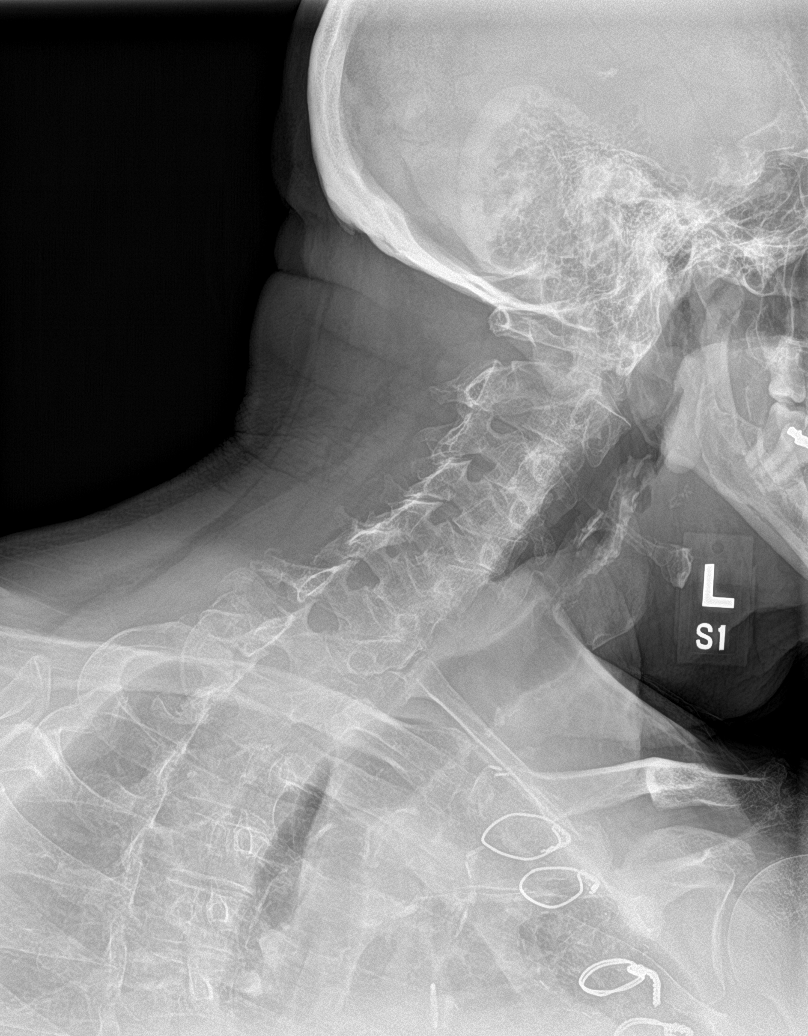

[c-spine obl (2 of 2)]
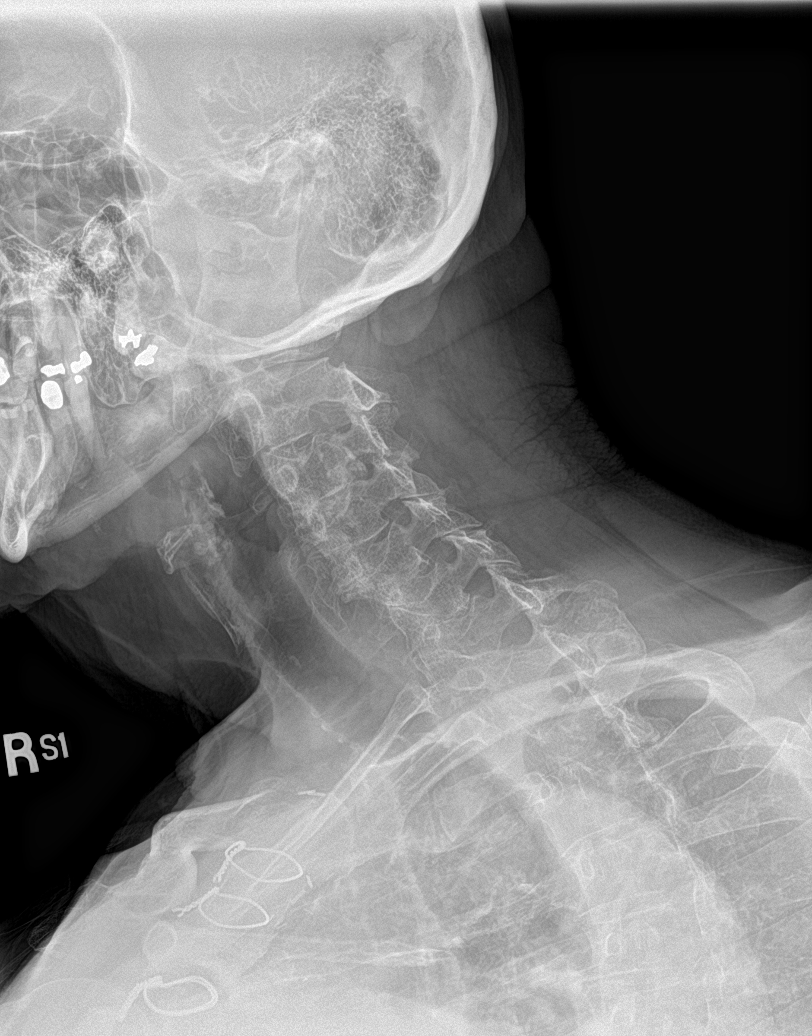

[c-spine ap]
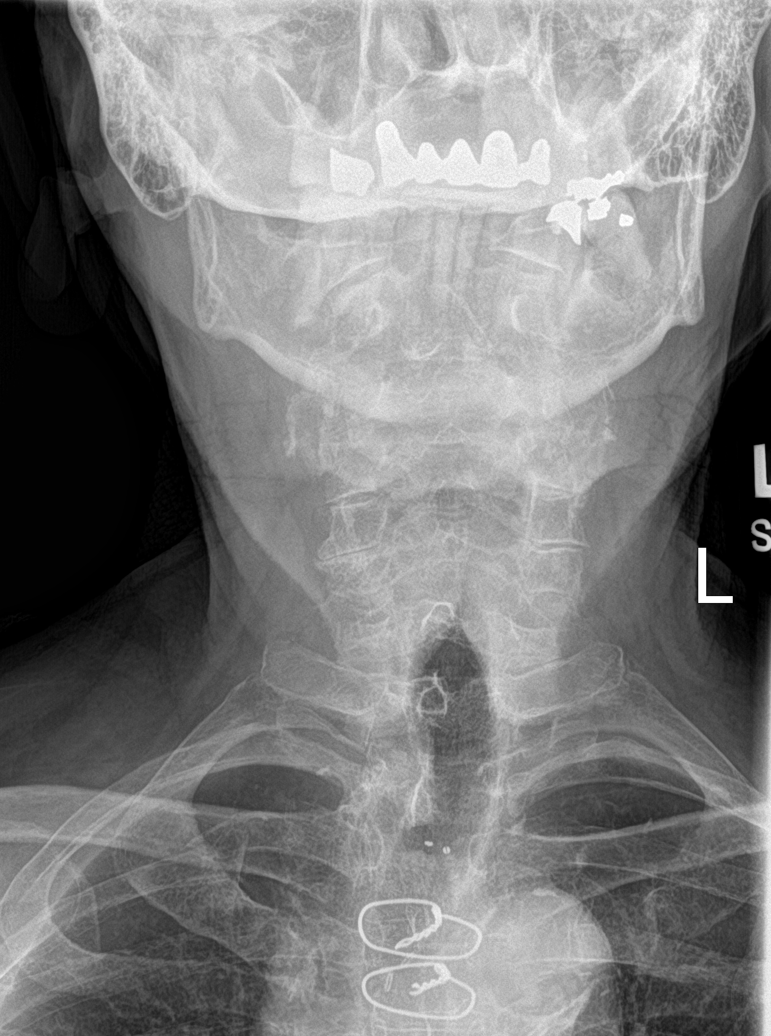

[swimmer]
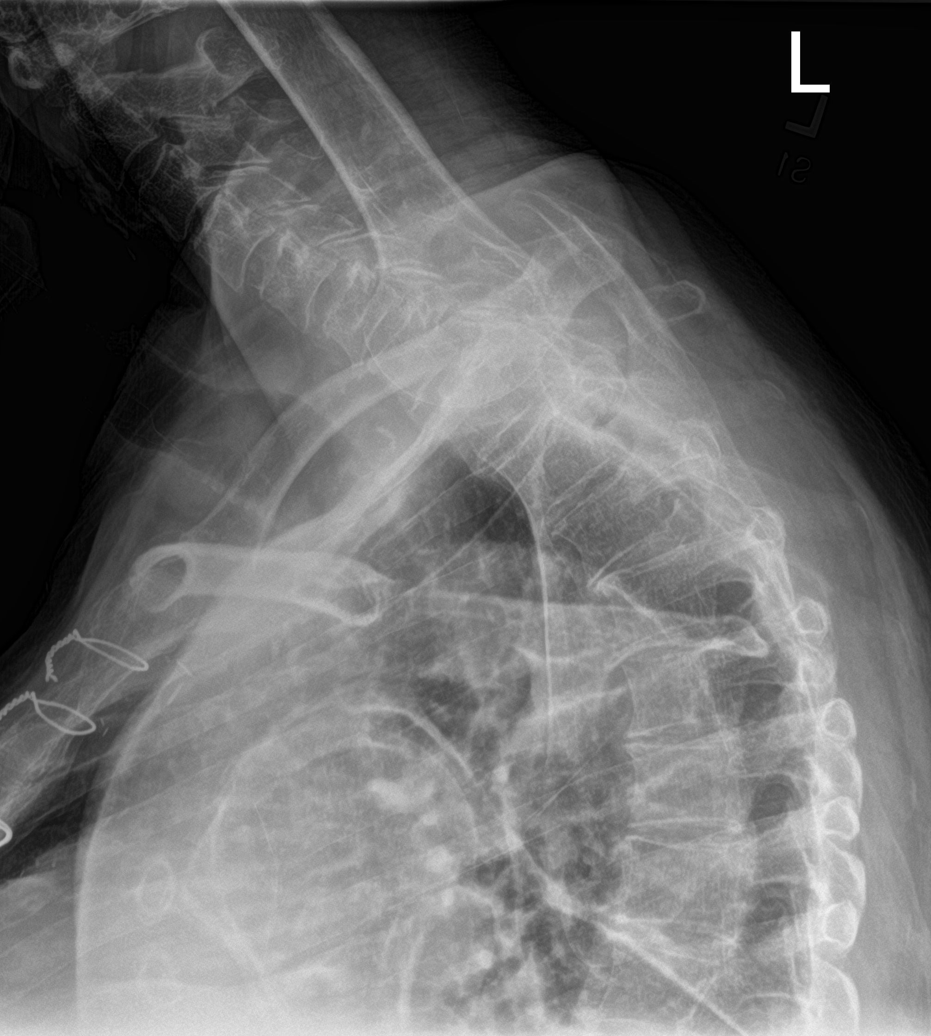

[c-spine open mouth]
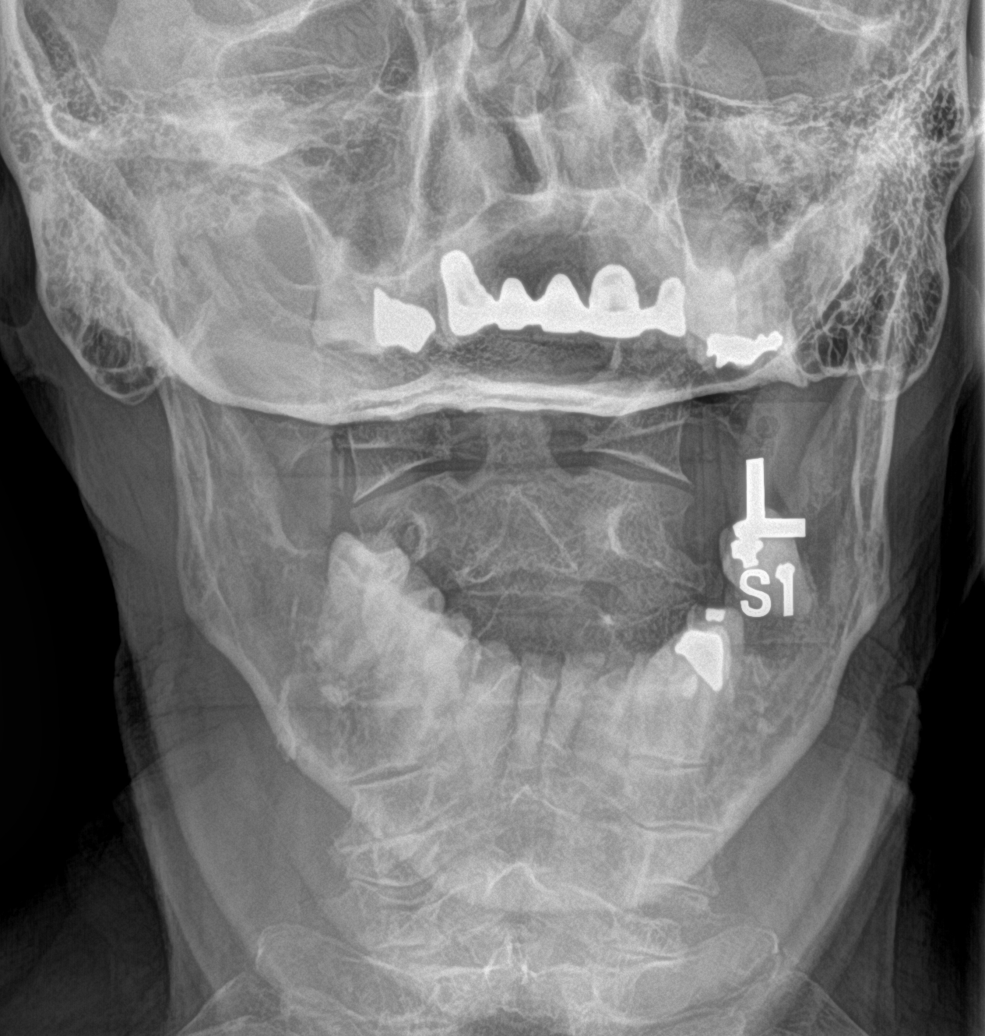

[6 of 6 positions shown; findings below may reference images not displayed]

FINDINGS: Frontal, lateral, open-mouth odontoid, and bilateral oblique views
were obtained. There is no fracture. There is minimal retrolisthesis
of C3 on C4. There is just over 1 mm of anterolisthesis of C5 on C6.
There is minimal anterolisthesis of C6 on C7. Prevertebral soft
tissues and predental space regions are normal. There is moderate
disc space narrowing at C3-4 and C6-7. There is no significant exit
foraminal narrowing on the oblique views. There is extensive
calcification in both carotid arteries.
IMPRESSION: Osteoarthritic change at several levels. Mild spondylolisthesis at
several levels is apparently due to underlying spondylosis. There is
no demonstrable fracture. There is extensive carotid artery
calcification bilaterally.

## 2016-02-03 ENCOUNTER — Other Ambulatory Visit: Payer: Self-pay

## 2016-02-03 DIAGNOSIS — M436 Torticollis: Secondary | ICD-10-CM

## 2016-02-09 ENCOUNTER — Ambulatory Visit: Payer: PPO | Admitting: Physical Therapy

## 2016-02-21 ENCOUNTER — Telehealth: Payer: Self-pay | Admitting: Cardiology

## 2016-02-21 NOTE — Telephone Encounter (Signed)
I took call from patient. He notes discomfort early AM ~3:00. Denies this waking him from sleep. States he was getting up to go to bathroom and felt soreness in chest below left breast. - checked BP 122/80, does not recall HR. tried to lay back down. sitting in lounge chair. no presence of pain  felt like a bruise - no visible bruise. Denies sharp pain.  He notes it feels like muscle pain, hurts when he stretches. States he had a shoulder injury a few weeks ago and feels like this could possibly be related.   He states no exertional component to discomfort. No radiating pain. Notes character of pain changes with stretching/positioning.  I told him it sounds like this is likely not a cardiac pain but MSK in nature - will route to his provider for review. Patient aware to go to ED if he has worsening symptoms, pain is severe, or he has shortness of breath & that he may call if further questions.

## 2016-02-21 NOTE — Telephone Encounter (Signed)
New message  Chest pains to back  1. No chest pains now/soreness 2. No other symptoms 3. Experiencing since early this morning 4. Soreness comes and goes 5. No nitro

## 2016-02-21 NOTE — Telephone Encounter (Signed)
Agree, sounds muscular. Recommend rest and Tylenol.  Peter Martinique MD, [facility]

## 2016-02-22 NOTE — Telephone Encounter (Signed)
Recommendations communicated w patient.

## 2016-03-05 DIAGNOSIS — H25812 Combined forms of age-related cataract, left eye: Secondary | ICD-10-CM | POA: Diagnosis not present

## 2016-03-05 DIAGNOSIS — H2512 Age-related nuclear cataract, left eye: Secondary | ICD-10-CM | POA: Diagnosis not present

## 2016-03-06 ENCOUNTER — Encounter: Payer: Self-pay | Admitting: Family Medicine

## 2016-03-06 ENCOUNTER — Ambulatory Visit (INDEPENDENT_AMBULATORY_CARE_PROVIDER_SITE_OTHER): Payer: PPO | Admitting: Family Medicine

## 2016-03-06 VITALS — BP 122/80 | HR 71 | Temp 97.3°F | Ht 68.0 in | Wt 168.9 lb

## 2016-03-06 DIAGNOSIS — M25511 Pain in right shoulder: Secondary | ICD-10-CM

## 2016-03-06 DIAGNOSIS — M542 Cervicalgia: Secondary | ICD-10-CM

## 2016-03-06 NOTE — Progress Notes (Signed)
Subjective:     Patient ID: Miguel Vazquez, male   DOB: May 14, 1940, 75 y.o.   MRN: [id number]  HPI Patient seen for follow-up regarding recent right neck and shoulder pain following a fall. Refer to previous note. There were some yellow jackets flying around and he lost balance and fell backwards. Cervical spine films showed degenerative spondylosis but no fracture. He states that his right shoulder and neck pains have fully resolved at this time. No recent chest pains. He had recent cataract repair and that went well. Type 2 diabetes with most recent A1c 7.4%. No hypoglycemic symptoms.  Past Medical History:  Diagnosis Date  . Arthritis   . Cataract   . Coronary artery disease   . Diabetes mellitus   . GERD (gastroesophageal reflux disease)   . Hx of abdominal aortic aneurysm 1998  . Hyperlipidemia   . Hypertension   . Personal history of colonic polyps - adenomas 09/09/2013   Past Surgical History:  Procedure Laterality Date  . ABDOMINAL AORTIC ANEURYSM REPAIR  1998  . CARDIAC CATHETERIZATION N/A 03/18/2015   Procedure: Left Heart Cath and Coronary Angiography;  Surgeon: Peter M Martinique, MD;  Location: [place] [facility];  Service: Cardiovascular;  Laterality: N/A;  . cataract surgery Right   . COLONOSCOPY    . CORONARY ARTERY BYPASS GRAFT N/A 03/18/2015   Procedure: CORONARY ARTERY BYPASS GRAFTING (CABG) x  three, using left internal mammary artery and right leg greater saphenous vein harvested endoscopically;  Surgeon: Melrose Nakayama, MD;  Location: [facility];  Service: Open Heart Surgery;  Laterality: N/A;  . INGUINAL HERNIA REPAIR Bilateral   . INTRAOPERATIVE TRANSESOPHAGEAL ECHOCARDIOGRAM N/A 03/18/2015   Procedure: INTRAOPERATIVE TRANSESOPHAGEAL ECHOCARDIOGRAM;  Surgeon: Melrose Nakayama, MD;  Location: [facility];  Service: Open Heart Surgery;  Laterality: N/A;  . [place]    reports that he quit smoking about 29 years ago. His smoking use included  Cigarettes. He has a 30.00 pack-year smoking history. He has never used smokeless tobacco. He reports that he drinks about 4.2 oz of alcohol per week . He reports that he does not use drugs. family history includes Diabetes in his paternal aunt. No Known Allergies   Review of Systems  Constitutional: Negative for fatigue.  Eyes: Negative for visual disturbance.  Respiratory: Negative for cough, chest tightness and shortness of breath.   Cardiovascular: Negative for chest pain, palpitations and leg swelling.  Neurological: Negative for dizziness, syncope, weakness, light-headedness and headaches.       Objective:   Physical Exam  Constitutional: He appears well-developed and well-nourished.  Neck:  Full range of motion cervical spine. No spinal tenderness  Cardiovascular: Normal rate and regular rhythm.   Pulmonary/Chest: Effort normal and breath sounds normal. No respiratory distress. He has no wheezes. He has no rales.  Musculoskeletal:  Full range of motion right shoulder       Assessment:     Recent fall with right shoulder pain right neck pain resolved    Plan:     -Routine follow-up in 3 months. Obtain labs at that point with hemoglobin A1c  Eulas Post MD [facility] at [facility]

## 2016-03-06 NOTE — Progress Notes (Signed)
Pre visit review using our clinic review tool, if applicable. No additional management support is needed unless otherwise documented below in the visit note. 

## 2016-04-02 DIAGNOSIS — I671 Cerebral aneurysm, nonruptured: Secondary | ICD-10-CM

## 2016-04-02 HISTORY — DX: Cerebral aneurysm, nonruptured: I67.1

## 2016-04-23 ENCOUNTER — Ambulatory Visit (INDEPENDENT_AMBULATORY_CARE_PROVIDER_SITE_OTHER): Payer: PPO | Admitting: Neurology

## 2016-04-23 ENCOUNTER — Encounter: Payer: Self-pay | Admitting: Neurology

## 2016-04-23 VITALS — BP 121/82 | HR 78 | Wt 173.0 lb

## 2016-04-23 DIAGNOSIS — I10 Essential (primary) hypertension: Secondary | ICD-10-CM | POA: Diagnosis not present

## 2016-04-23 DIAGNOSIS — E785 Hyperlipidemia, unspecified: Secondary | ICD-10-CM

## 2016-04-23 DIAGNOSIS — E1121 Type 2 diabetes mellitus with diabetic nephropathy: Secondary | ICD-10-CM

## 2016-04-23 DIAGNOSIS — I672 Cerebral atherosclerosis: Secondary | ICD-10-CM

## 2016-04-23 DIAGNOSIS — I25810 Atherosclerosis of coronary artery bypass graft(s) without angina pectoris: Secondary | ICD-10-CM

## 2016-04-23 DIAGNOSIS — Z9889 Other specified postprocedural states: Secondary | ICD-10-CM | POA: Diagnosis not present

## 2016-04-23 DIAGNOSIS — I671 Cerebral aneurysm, nonruptured: Secondary | ICD-10-CM | POA: Diagnosis not present

## 2016-04-23 NOTE — Progress Notes (Signed)
NEUROLOGY CLINIC FOLLOW UP NOTE  NAME: Miguel Vazquez DOB: [birth year]  HPI: Miguel Vazquez is a 76 y.o. male with PMH of CAD, DM, HLD, and HTN who presents as a new patient for intracranial atherosclerosis.   He was in his normal health until 01/06/16 he woke up with severe HA. His whole head hurt, 8/10 and was about to explode. Felt constant pain without throbbing feeling. Denies N/V, photophobia or phonophobia. Denies hx of migraine or HA. His BP in ER was very high at that time. He was admitted and BP controlled. His HA lasted about several hours and resolved. 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 He was then discharged with referral to Dr. Kathyrn Sheriff for aneurysm follow up. Dr. Kathyrn Sheriff saw pt and felt not candidate for intervention and referred to neurology for further evaluation.   He is on ASA and lipitor as home meds. He also takes metformin and glucose controlled well. hsi BP at home 130-140, today in clinic 141/85. Has quit smoking for 30 years, no alcohol or illicit drugs.   Interval history: During the interval time, pt has been doing well. On ASA and lipitor without side effect. BP controlled well at home, today 121/82. Glucose at  Home 70s-130s. He did not check daily but 3 times a week. However, CUS was not done yet. No other complains. No HA.   Past Medical History:  Diagnosis Date  . Arthritis   . Cataract   . Coronary artery disease   . Diabetes mellitus   . GERD (gastroesophageal reflux disease)   . Hx of abdominal aortic aneurysm 1998  . Hyperlipidemia   . Hypertension   . Personal history of colonic polyps - adenomas 09/09/2013   Past Surgical History:  Procedure Laterality Date  . ABDOMINAL AORTIC ANEURYSM REPAIR  1998  . CARDIAC CATHETERIZATION N/A 03/18/2015   Procedure: Left Heart Cath and Coronary Angiography;  Surgeon: Peter M Martinique, MD;  Location: [place] [facility];  Service: Cardiovascular;   Laterality: N/A;  . cataract surgery Right   . COLONOSCOPY    . CORONARY ARTERY BYPASS GRAFT N/A 03/18/2015   Procedure: CORONARY ARTERY BYPASS GRAFTING (CABG) x  three, using left internal mammary artery and right leg greater saphenous vein harvested endoscopically;  Surgeon: Melrose Nakayama, MD;  Location: [place];  Service: Open Heart Surgery;  Laterality: N/A;  . INGUINAL HERNIA REPAIR Bilateral   . INTRAOPERATIVE TRANSESOPHAGEAL ECHOCARDIOGRAM N/A 03/18/2015   Procedure: INTRAOPERATIVE TRANSESOPHAGEAL ECHOCARDIOGRAM;  Surgeon: Melrose Nakayama, MD;  Location: [place];  Service: Open Heart Surgery;  Laterality: N/A;  . open heart surgery  2016  . UMBILICAL HERNIA REPAIR  1965   Family History  Problem Relation Age of Onset  . Diabetes Paternal Aunt   . Colon cancer Neg Hx    Current Outpatient Prescriptions  Medication Sig Dispense Refill  . aspirin EC 325 MG EC tablet Take 1 tablet (325 mg total) by mouth daily.    Marland Kitchen atorvastatin (LIPITOR) 80 MG tablet Take 1 tablet (80 mg total) by mouth daily at 6 PM. 90 tablet 3  . carvedilol (COREG) 6.25 MG tablet Take 1 tablet (6.25 mg total) by mouth 2 (two) times daily. 180 tablet 3  . glucose blood (FREESTYLE LITE) test strip Check Blood Sugars 1-2 times per day. DX: E11.9. 100 each 5  . HYDROcodone-acetaminophen (NORCO/VICODIN) 5-325 MG tablet Take 1 tablet by mouth every 6 (six) hours as  needed for moderate pain. 30 tablet 0  . KRILL OIL PO Take 1 capsule by mouth daily.     . Lancets (FREESTYLE) lancets Check Blood Sugars 1-2 times per day. DX: E11.9 100 each 5  . lisinopril (PRINIVIL,ZESTRIL) 20 MG tablet Take 1 tablet (20 mg total) by mouth daily. 90 tablet 3  . metFORMIN (GLUCOPHAGE) 1000 MG tablet Take 1 tablet (1,000 mg total) by mouth 2 (two) times daily. 180 tablet 3  . Multiple Vitamin (MULTIVITAMIN) tablet Take 1 tablet by mouth daily. Centrium Silver once daily     . vitamin C (ASCORBIC ACID) 500 MG tablet Take 500 mg by  mouth daily.     No current facility-administered medications for this visit.    No Known Allergies Social History   Social History  . Marital status: Married    Spouse name: Miguel Vazquez  . Number of children: 3  . Years of education: N/A   Occupational History  . Not on file.   Social History Main Topics  . Smoking status: Former Smoker    Packs/day: 1.50    Years: 20.00    Types: Cigarettes    Quit date: 08/24/1986  . Smokeless tobacco: Never Used  . Alcohol use 4.2 oz/week    7 Glasses of wine per week     Comment: occasionally  . Drug use: No  . Sexual activity: Not on file   Other Topics Concern  . Not on file   Social History Narrative  . No narrative on file    Review of Systems Full 14 system review of systems performed and notable only for those listed, all others are neg:  Constitutional:   Cardiovascular:  Ear/Nose/Throat:   Skin:  Eyes:   Respiratory:   Gastroitestinal:   Genitourinary:  Hematology/Lymphatic:   Endocrine:  Musculoskeletal:  Allergy/Immunology:   Neurological:   Psychiatric:  Sleep:    Physical Exam  Vitals:   04/23/16 1520  BP: 121/82  Pulse: 78    General - Well nourished, well developed, in no apparent distress.  Ophthalmologic - Sharp disc margins OU.  Cardiovascular - Regular rate and rhythm with no murmur.    Neck - supple, no nuchal rigidity .  Mental Status -  Level of arousal and orientation to time, place, and person were intact. Language including expression, naming, repetition, comprehension, reading, and writing was assessed and found intact. Fund of Knowledge was assessed and was intact.  Cranial Nerves II - XII - II - Visual field intact OU. III, IV, VI - Extraocular movements intact. V - Facial sensation intact bilaterally. VII - Facial movement intact bilaterally. VIII - Hearing & vestibular intact bilaterally. X - Palate elevates symmetrically. XI - Chin turning & shoulder shrug intact  bilaterally. XII - Tongue protrusion intact.  Motor Strength - The patient's strength was normal in all extremities and pronator drift was absent.  Bulk was normal and fasciculations were absent.   Motor Tone - Muscle tone was assessed at the neck and appendages and was normal.  Reflexes - The patient's reflexes were normal in all extremities and he had no pathological reflexes.  Sensory - Light touch, temperature/pinprick, vibration and proprioception, and Romberg testing were assessed and were normal.    Coordination - The patient had normal movements in the hands and feet with no ataxia or dysmetria.  Tremor was absent.  Gait and Station - The patient's transfers, posture, gait, station, and turns were observed as normal.   Imaging  I  have personally reviewed the radiological images below and agree with the radiology interpretations.  Ct Angio Head W Or Wo Contrast 01/06/2016 IMPRESSION: 1. No acute intracranial abnormality. Negative for intracranial saccular aneurysm or large vessel occlusion. 2. 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 3. Extensive calcified intracranial atherosclerosis. Hemodynamically significant (moderate) appearing stenoses of the right ICA siphon (cavernous and supraclinoid segments) and proximal basilar artery. 4. 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   Lab Review Component     Latest Ref Rng & Units 07/15/2015 01/13/2016  Cholesterol     125 - 200 mg/dL 141   Triglycerides     <150 mg/dL 113   HDL Cholesterol     >=40 mg/dL 43   Total CHOL/HDL Ratio     <=5.0 Ratio 3.3   VLDL     <30 mg/dL 23   LDL (calc)     <130 mg/dL 75   Hemoglobin A1C     <5.7 %  7.4 (H)  Mean Plasma Glucose     mg/dL  166     Assessment:    In summary, JENTZEN NAJARIAN is a 76 y.o. male with PMH of CAD, DM, HLD, and HTN who presents as a new patient for intracranial atherosclerosis. Developed severe HA on 01/06/16 likely due to significant high BP. 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 He was referred to neurology for further evaluation. A1C 7.4, LDL 75. Has quit smoking for 30 years.  He was on ASA and lipitor as home meds, but increased to full dose ASA and lipitor 80mg . BP and DM meds also adjusted and currently BP and glucose stable. CUS still pending.   Plan: - continue ASA and lipitor for intracranial stenosis - check BP and glucose at home, BP gaol 120-140 - reorder CUS - Follow up with your primary care physician for stroke risk factor modification. Recommend maintain blood pressure goal <140/80, diabetes with hemoglobin A1c goal below 7.0% and lipids with LDL cholesterol goal below 70 mg/dL.  - healthy diet and regular exercise - follow up in 6 months with Cecille Rubin NP   Orders Placed This Encounter  Procedures  . US Carotid Bilateral    Standing Status:   Future    Standing Expiration Date:   06/25/2017    Order Specific Question:   Reason for Exam (SYMPTOM  OR DIAGNOSIS REQUIRED)    Answer:   stroke work up    Order Specific Question:   Preferred imaging location?    Answer:   Internal    Meds ordered this encounter  Medications  . vitamin C (ASCORBIC ACID) 500 MG tablet    Sig: Take 500 mg by mouth daily.    Patient Instructions  - continue ASA and lipitor for intracranial stenosis - check BP and glucose at home - BP gaol 120-140 - need to check carotid artery ultrasound - Follow up with your primary care physician for stroke risk factor modification. Recommend maintain blood pressure goal <140/80, diabetes with hemoglobin A1c goal below 7.0% and lipids with LDL cholesterol goal below 70 mg/dL.  - healthy diet and regular exercise -  follow up in 6 months   Rosalin Hawking, MD PhD [facility] [address], [place], [place] [address] [phone]

## 2016-04-23 NOTE — Patient Instructions (Addendum)
-   continue ASA and lipitor for intracranial stenosis - check BP and glucose at home - BP gaol 120-140 - need to check carotid artery ultrasound - Follow up with your primary care physician for stroke risk factor modification. Recommend maintain blood pressure goal <140/80, diabetes with hemoglobin A1c goal below 7.0% and lipids with LDL cholesterol goal below 70 mg/dL.  - healthy diet and regular exercise - follow up in 6 months

## 2016-05-03 ENCOUNTER — Ambulatory Visit (INDEPENDENT_AMBULATORY_CARE_PROVIDER_SITE_OTHER): Payer: PPO

## 2016-05-03 DIAGNOSIS — I672 Cerebral atherosclerosis: Secondary | ICD-10-CM

## 2016-05-15 ENCOUNTER — Telehealth: Payer: Self-pay

## 2016-05-15 NOTE — Telephone Encounter (Signed)
Left vm for patient to call back during business hours about US carotid ultrasound.

## 2016-05-15 NOTE — Telephone Encounter (Signed)
-----   Message from Rosalin Hawking, MD sent at 05/14/2016  6:07 PM EST ----- Could you please let the patient know that the carotid artery ultrasound test done recently in our office was normal and no carotid artery narrowing. Please continue current treatment. Thanks.  Rosalin Hawking, MD PhD Stroke Neurology 05/14/2016 6:07 PM

## 2016-05-16 NOTE — Telephone Encounter (Signed)
Patient's daughter is returning a call for doppler results. She can be reached at [phone].

## 2016-05-16 NOTE — Telephone Encounter (Signed)
Rn call patients daughter Steffanie Dunn about her fathers carotid ultrasound. Rn stated the carotid artery ultrasound test done recently in our office was normal and no carotid artery narrowing. Please continue current treatment. PTs daughter verbalized understanding and will tell her father.

## 2016-06-04 ENCOUNTER — Encounter: Payer: Self-pay | Admitting: Family Medicine

## 2016-06-04 ENCOUNTER — Ambulatory Visit (INDEPENDENT_AMBULATORY_CARE_PROVIDER_SITE_OTHER): Payer: PPO | Admitting: Family Medicine

## 2016-06-04 VITALS — BP 138/84 | HR 94 | Temp 97.4°F | Wt 169.3 lb

## 2016-06-04 DIAGNOSIS — I10 Essential (primary) hypertension: Secondary | ICD-10-CM

## 2016-06-04 DIAGNOSIS — R42 Dizziness and giddiness: Secondary | ICD-10-CM

## 2016-06-04 DIAGNOSIS — E785 Hyperlipidemia, unspecified: Secondary | ICD-10-CM

## 2016-06-04 DIAGNOSIS — E1121 Type 2 diabetes mellitus with diabetic nephropathy: Secondary | ICD-10-CM

## 2016-06-04 LAB — HEPATIC FUNCTION PANEL
ALBUMIN: 4.2 g/dL (ref 3.5–5.2)
ALT: 19 U/L (ref 0–53)
AST: 17 U/L (ref 0–37)
Alkaline Phosphatase: 64 U/L (ref 39–117)
Bilirubin, Direct: 0.2 mg/dL (ref 0.0–0.3)
Total Bilirubin: 0.8 mg/dL (ref 0.2–1.2)
Total Protein: 6.7 g/dL (ref 6.0–8.3)

## 2016-06-04 LAB — LIPID PANEL
CHOL/HDL RATIO: 3
CHOLESTEROL: 129 mg/dL (ref 0–200)
HDL: 38.4 mg/dL — ABNORMAL LOW (ref >39.00)
LDL CALC: 68 mg/dL (ref 0–99)
NonHDL: 90.72
TRIGLYCERIDES: 115 mg/dL (ref 0.0–149.0)
VLDL: 23 mg/dL (ref 0.0–40.0)

## 2016-06-04 LAB — HEMOGLOBIN A1C: Hgb A1c MFr Bld: 6.8 % — ABNORMAL HIGH (ref 4.6–6.5)

## 2016-06-04 LAB — BASIC METABOLIC PANEL
BUN: 16 mg/dL (ref 6–23)
CALCIUM: 9.8 mg/dL (ref 8.4–10.5)
CO2: 31 mEq/L (ref 19–32)
Chloride: 103 mEq/L (ref 96–112)
Creatinine, Ser: 0.88 mg/dL (ref 0.40–1.50)
GFR: 89.89 mL/min (ref >60.00)
GLUCOSE: 186 mg/dL — AB (ref 70–99)
Potassium: 4.1 mEq/L (ref 3.5–5.1)
Sodium: 142 mEq/L (ref 135–145)

## 2016-06-04 NOTE — Progress Notes (Signed)
Subjective:     Patient ID: Miguel Vazquez, male   DOB: [birth year], 76 y.o.   MRN: [id number]  HPI   Patient seen for routine medical follow-up. History type 2 diabetes, CAD, hypertension, dyslipidemia, history of abdominal aortic aneurysm repair. Blood pressures have been fairly well controlled by recent home readingss. No recent chest pains.   Does have some intermittent vertigo symptoms which occur very transiently when he first stands up and usually go after after about 30 seconds or less. Symptoms are somewhat inconsistent. He has some mild urine urgency but usually only gets about one time at night. Denies any burning with urination. Last A1c 7.4%. Medications reviewed and compliant with all.  Past Medical History:  Diagnosis Date  . Arthritis   . Cataract   . Coronary artery disease   . Diabetes mellitus   . GERD (gastroesophageal reflux disease)   . Hx of abdominal aortic aneurysm 1998  . Hyperlipidemia   . Hypertension   . Personal history of colonic polyps - adenomas 09/09/2013   Past Surgical History:  Procedure Laterality Date  . ABDOMINAL AORTIC ANEURYSM REPAIR  1998  . CARDIAC CATHETERIZATION N/A 03/18/2015   Procedure: Left Heart Cath and Coronary Angiography;  Surgeon: Peter M Martinique, MD;  Location: [place] [facility];  Service: Cardiovascular;  Laterality: N/A;  . cataract surgery Right   . COLONOSCOPY    . CORONARY ARTERY BYPASS GRAFT N/A 03/18/2015   Procedure: CORONARY ARTERY BYPASS GRAFTING (CABG) x  three, using left internal mammary artery and right leg greater saphenous vein harvested endoscopically;  Surgeon: Melrose Nakayama, MD;  Location: [place];  Service: Open Heart Surgery;  Laterality: N/A;  . INGUINAL HERNIA REPAIR Bilateral   . INTRAOPERATIVE TRANSESOPHAGEAL ECHOCARDIOGRAM N/A 03/18/2015   Procedure: INTRAOPERATIVE TRANSESOPHAGEAL ECHOCARDIOGRAM;  Surgeon: Melrose Nakayama, MD;  Location: [place];  Service: Open Heart Surgery;  Laterality: N/A;  .  open heart surgery  2016  . [place]    reports that he quit smoking about 29 years ago. His smoking use included Cigarettes. He has a 30.00 pack-year smoking history. He has never used smokeless tobacco. He reports that he drinks about 4.2 oz of alcohol per week . He reports that he does not use drugs. family history includes Diabetes in his paternal aunt. No Known Allergies   Review of Systems  Constitutional: Negative for fatigue.  Eyes: Negative for visual disturbance.  Respiratory: Negative for cough, chest tightness and shortness of breath.   Cardiovascular: Negative for chest pain, palpitations and leg swelling.  Neurological: Negative for dizziness, syncope, weakness, light-headedness and headaches.       Objective:   Physical Exam  Constitutional: He is oriented to person, place, and time. He appears well-developed and well-nourished.  HENT:  Right Ear: External ear normal.  Left Ear: External ear normal.  Mouth/Throat: Oropharynx is clear and moist.  Eyes: Pupils are equal, round, and reactive to light.  Neck: Neck supple. No thyromegaly present.  Cardiovascular: Normal rate and regular rhythm.   Pulmonary/Chest: Effort normal and breath sounds normal. No respiratory distress. He has no wheezes. He has no rales.  Musculoskeletal: He exhibits no edema.  Neurological: He is alert and oriented to person, place, and time. No cranial nerve deficit. Coordination normal.  Gait normal.       Assessment:     #1 hypertension fairly stable with occasional borderline elevations  #2 type 2 diabetes  #3 dyslipidemia  #4 transient  vertigo.  No red flags for more worrisome vertigo and non-focal exam    Plan:    - -recheck labs today with hemoglobin A1c, lipid panel, hepatic panel, basic metabolic panel -Consider vestibular rehabilitation if vertigo symptoms persist -Continue to monitor blood pressure closely and be in touch if consistently greater than  140/90  Eulas Post MD [facility] at [facility]

## 2016-06-04 NOTE — Progress Notes (Signed)
Pre visit review using our clinic review tool, if applicable. No additional management support is needed unless otherwise documented below in the visit note. 

## 2016-06-05 ENCOUNTER — Other Ambulatory Visit: Payer: Self-pay | Admitting: Cardiology

## 2016-09-02 ENCOUNTER — Inpatient Hospital Stay (HOSPITAL_COMMUNITY)
Admission: EM | Admit: 2016-09-02 | Discharge: 2016-09-04 | DRG: 915 | Disposition: A | Discharge status: Home / Self Care | Payer: PPO | Attending: Pulmonary Disease | Admitting: Pulmonary Disease

## 2016-09-02 ENCOUNTER — Encounter (HOSPITAL_COMMUNITY): Payer: Self-pay | Admitting: Emergency Medicine

## 2016-09-02 DIAGNOSIS — T464X5A Adverse effect of angiotensin-converting-enzyme inhibitors, initial encounter: Secondary | ICD-10-CM | POA: Diagnosis not present

## 2016-09-02 DIAGNOSIS — Z7982 Long term (current) use of aspirin: Secondary | ICD-10-CM

## 2016-09-02 DIAGNOSIS — K219 Gastro-esophageal reflux disease without esophagitis: Secondary | ICD-10-CM | POA: Diagnosis present

## 2016-09-02 DIAGNOSIS — T450X5A Adverse effect of antiallergic and antiemetic drugs, initial encounter: Secondary | ICD-10-CM | POA: Diagnosis not present

## 2016-09-02 DIAGNOSIS — I10 Essential (primary) hypertension: Secondary | ICD-10-CM | POA: Diagnosis not present

## 2016-09-02 DIAGNOSIS — E119 Type 2 diabetes mellitus without complications: Secondary | ICD-10-CM | POA: Diagnosis not present

## 2016-09-02 DIAGNOSIS — T781XXA Other adverse food reactions, not elsewhere classified, initial encounter: Secondary | ICD-10-CM | POA: Diagnosis present

## 2016-09-02 DIAGNOSIS — Z833 Family history of diabetes mellitus: Secondary | ICD-10-CM

## 2016-09-02 DIAGNOSIS — G9340 Encephalopathy, unspecified: Secondary | ICD-10-CM | POA: Diagnosis not present

## 2016-09-02 DIAGNOSIS — Z7984 Long term (current) use of oral hypoglycemic drugs: Secondary | ICD-10-CM | POA: Diagnosis not present

## 2016-09-02 DIAGNOSIS — Z8673 Personal history of transient ischemic attack (TIA), and cerebral infarction without residual deficits: Secondary | ICD-10-CM | POA: Diagnosis not present

## 2016-09-02 DIAGNOSIS — I2510 Atherosclerotic heart disease of native coronary artery without angina pectoris: Secondary | ICD-10-CM | POA: Diagnosis present

## 2016-09-02 DIAGNOSIS — L8990 Pressure ulcer of unspecified site, unspecified stage: Secondary | ICD-10-CM | POA: Insufficient documentation

## 2016-09-02 DIAGNOSIS — T783XXA Angioneurotic edema, initial encounter: Principal | ICD-10-CM | POA: Diagnosis present

## 2016-09-02 DIAGNOSIS — X58XXXA Exposure to other specified factors, initial encounter: Secondary | ICD-10-CM | POA: Diagnosis present

## 2016-09-02 DIAGNOSIS — Z8679 Personal history of other diseases of the circulatory system: Secondary | ICD-10-CM | POA: Diagnosis not present

## 2016-09-02 DIAGNOSIS — Z79899 Other long term (current) drug therapy: Secondary | ICD-10-CM

## 2016-09-02 DIAGNOSIS — T508X5A Adverse effect of diagnostic agents, initial encounter: Secondary | ICD-10-CM | POA: Diagnosis not present

## 2016-09-02 DIAGNOSIS — Z87891 Personal history of nicotine dependence: Secondary | ICD-10-CM | POA: Diagnosis not present

## 2016-09-02 DIAGNOSIS — E785 Hyperlipidemia, unspecified: Secondary | ICD-10-CM | POA: Diagnosis not present

## 2016-09-02 DIAGNOSIS — R410 Disorientation, unspecified: Secondary | ICD-10-CM | POA: Diagnosis not present

## 2016-09-02 DIAGNOSIS — Z86010 Personal history of colonic polyps: Secondary | ICD-10-CM

## 2016-09-02 DIAGNOSIS — Z951 Presence of aortocoronary bypass graft: Secondary | ICD-10-CM | POA: Diagnosis not present

## 2016-09-02 HISTORY — DX: Cerebral aneurysm, nonruptured: I67.1

## 2016-09-02 LAB — CBC WITH DIFFERENTIAL/PLATELET
BASOS ABS: 0 10*3/uL (ref 0.0–0.1)
BASOS PCT: 1 %
Eosinophils Absolute: 0.6 10*3/uL (ref 0.0–0.7)
Eosinophils Relative: 7 %
HEMATOCRIT: 48.4 % (ref 39.0–52.0)
Hemoglobin: 16.9 g/dL (ref 13.0–17.0)
Lymphocytes Relative: 25 %
Lymphs Abs: 2.2 10*3/uL (ref 0.7–4.0)
MCH: 33.5 pg (ref 26.0–34.0)
MCHC: 34.9 g/dL (ref 30.0–36.0)
MCV: 95.8 fL (ref 78.0–100.0)
MONO ABS: 0.5 10*3/uL (ref 0.1–1.0)
Monocytes Relative: 6 %
NEUTROS ABS: 5.5 10*3/uL (ref 1.7–7.7)
NEUTROS PCT: 61 %
Platelets: 178 10*3/uL (ref 150–400)
RBC: 5.05 MIL/uL (ref 4.22–5.81)
RDW: 13.6 % (ref 11.5–15.5)
WBC: 8.9 10*3/uL (ref 4.0–10.5)

## 2016-09-02 LAB — BASIC METABOLIC PANEL
ANION GAP: 11 (ref 5–15)
BUN: 15 mg/dL (ref 6–20)
CALCIUM: 9.5 mg/dL (ref 8.9–10.3)
CO2: 25 mmol/L (ref 22–32)
Chloride: 105 mmol/L (ref 101–111)
Creatinine, Ser: 1.09 mg/dL (ref 0.61–1.24)
GFR calc non Af Amer: >60 mL/min (ref >60)
Glucose, Bld: 120 mg/dL — ABNORMAL HIGH (ref 65–99)
Potassium: 4.4 mmol/L (ref 3.5–5.1)
Sodium: 141 mmol/L (ref 135–145)

## 2016-09-02 LAB — GLUCOSE, CAPILLARY
GLUCOSE-CAPILLARY: 136 mg/dL — AB (ref 65–99)
GLUCOSE-CAPILLARY: 167 mg/dL — AB (ref 65–99)

## 2016-09-02 LAB — MRSA PCR SCREENING: MRSA BY PCR: NEGATIVE

## 2016-09-02 MED ORDER — ALBUTEROL SULFATE (2.5 MG/3ML) 0.083% IN NEBU
5.0000 mg | INHALATION_SOLUTION | Freq: Once | RESPIRATORY_TRACT | Status: DC
  Filled 2016-09-02: qty 6

## 2016-09-02 MED ORDER — DIPHENHYDRAMINE HCL 50 MG/ML IJ SOLN
50.0000 mg | Freq: Four times a day (QID) | INTRAMUSCULAR | Status: DC
  Administered 2016-09-03 (×3): 50 mg via INTRAVENOUS
  Filled 2016-09-02 (×3): qty 1

## 2016-09-02 MED ORDER — METOPROLOL TARTRATE 5 MG/5ML IV SOLN
2.5000 mg | Freq: Four times a day (QID) | INTRAVENOUS | Status: DC
  Filled 2016-09-02 (×2): qty 5

## 2016-09-02 MED ORDER — ASPIRIN 300 MG RE SUPP
300.0000 mg | Freq: Every day | RECTAL | Status: DC
  Filled 2016-09-02: qty 1

## 2016-09-02 MED ORDER — ENOXAPARIN SODIUM 40 MG/0.4ML ~~LOC~~ SOLN
40.0000 mg | SUBCUTANEOUS | Status: DC
  Administered 2016-09-02: 40 mg via SUBCUTANEOUS
  Filled 2016-09-02 (×2): qty 0.4

## 2016-09-02 MED ORDER — LACTATED RINGERS IV SOLN
INTRAVENOUS | Status: DC
  Administered 2016-09-02: 23:00:00 via INTRAVENOUS
  Filled 2016-09-02 (×2): qty 1000

## 2016-09-02 MED ORDER — FAMOTIDINE IN NACL 20-0.9 MG/50ML-% IV SOLN
20.0000 mg | Freq: Two times a day (BID) | INTRAVENOUS | Status: DC
  Administered 2016-09-03 – 2016-09-04 (×4): 20 mg via INTRAVENOUS
  Filled 2016-09-02 (×4): qty 50

## 2016-09-02 MED ORDER — METHYLPREDNISOLONE SODIUM SUCC 125 MG IJ SOLR
INTRAMUSCULAR | Status: AC
  Administered 2016-09-02: 125 mg
  Filled 2016-09-02: qty 2

## 2016-09-02 MED ORDER — DIPHENHYDRAMINE HCL 50 MG/ML IJ SOLN
INTRAMUSCULAR | Status: AC
  Administered 2016-09-02: 50 mg
  Filled 2016-09-02: qty 1

## 2016-09-02 MED ORDER — INSULIN ASPART 100 UNIT/ML ~~LOC~~ SOLN
2.0000 [IU] | SUBCUTANEOUS | Status: DC
  Administered 2016-09-03 (×2): 4 [IU] via SUBCUTANEOUS

## 2016-09-02 MED ORDER — METHYLPREDNISOLONE SODIUM SUCC 40 MG IJ SOLR
40.0000 mg | Freq: Four times a day (QID) | INTRAMUSCULAR | Status: DC
  Administered 2016-09-02 – 2016-09-03 (×2): 40 mg via INTRAVENOUS
  Filled 2016-09-02 (×3): qty 1

## 2016-09-02 MED ORDER — EPINEPHRINE 0.3 MG/0.3ML IJ SOAJ
0.3000 mg | Freq: Once | INTRAMUSCULAR | Status: AC
  Administered 2016-09-02: 0.3 mg via INTRAMUSCULAR
  Filled 2016-09-02: qty 0.3

## 2016-09-02 MED ORDER — FAMOTIDINE IN NACL 20-0.9 MG/50ML-% IV SOLN
20.0000 mg | Freq: Once | INTRAVENOUS | Status: AC
  Administered 2016-09-02: 20 mg via INTRAVENOUS
  Filled 2016-09-02: qty 50

## 2016-09-02 MED ORDER — EPINEPHRINE 0.3 MG/0.3ML IJ SOAJ
0.3000 mg | INTRAMUSCULAR | Status: DC | PRN
  Filled 2016-09-02: qty 0.3

## 2016-09-02 MED ORDER — EPINEPHRINE 0.3 MG/0.3ML IJ SOAJ
0.3000 mg | Freq: Once | INTRAMUSCULAR | Status: AC
  Administered 2016-09-02: 0.3 mg via INTRAMUSCULAR

## 2016-09-02 MED ORDER — SODIUM CHLORIDE 0.9 % IV SOLN
INTRAVENOUS | Status: DC
  Administered 2016-09-02: 17:00:00 via INTRAVENOUS

## 2016-09-02 MED ORDER — SODIUM CHLORIDE 0.9 % IV SOLN: 250.0000 mL | INTRAVENOUS | Status: DC | PRN

## 2016-09-02 NOTE — Discharge Instructions (Signed)
Do not take lisinopril type medicines again.

## 2016-09-02 NOTE — ED Provider Notes (Signed)
[facility] DEPT Provider Note   CSN: [id number] Arrival date & time: 09/02/16  1616     History   Chief Complaint Chief Complaint  Patient presents with  . Allergic Reaction    HPI Miguel Vazquez is a 76 y.o. male.  Patient with cardiac history presents with hives to the right arm and difficulty with speech and tongue swelling that started approximate one hour prior to arrival. No history of similar. No new foods or medications known. Patient is on the lisinopril. No fevers or chills. Mild wheezing. Symptoms constant.      Past Medical History:  Diagnosis Date  . Arthritis   . Cataract   . Coronary artery disease   . Diabetes mellitus   . GERD (gastroesophageal reflux disease)   . Hx of abdominal aortic aneurysm 1998  . Hyperlipidemia   . Hypertension   . Personal history of colonic polyps - adenomas 09/09/2013    Patient Active Problem List   Diagnosis Date Noted  . Cerebral atherosclerosis 01/18/2016  . Hypertensive urgency 01/06/2016  . Brain aneurysm 01/06/2016  . Headache 01/06/2016  . CAD (coronary artery disease) of artery bypass graft 12/22/2015  . 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 Cardiomyopathy, 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  . Scrotal inguinal hernia s/p lap repair w mesh 03/15/2015 03/21/2015  . STEMI 03/18/15 03/18/2015  . 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 Personal history of colonic polyps - adenomas 09/09/2013  . Obesity (BMI 30-39.9) 11/18/2012  . History of AAA (abdominal aortic aneurysm) repair 11/18/2012  . BACK PAIN, UPPER 08/10/2009  . Type 2 diabetes mellitus with renal manifestations ([place]) 10/15/2008  . Hyperlipidemia 10/15/2008  . Essential hypertension 10/15/2008    Past Surgical History:  Procedure Laterality Date  . ABDOMINAL AORTIC ANEURYSM REPAIR  1998  . CARDIAC CATHETERIZATION N/A 03/18/2015   Procedure: Left Heart Cath and Coronary Angiography;  Surgeon: Peter M Martinique, MD;  Location: [facility];  Service: Cardiovascular;   Laterality: N/A;  . cataract surgery Right   . COLONOSCOPY    . CORONARY ARTERY BYPASS GRAFT N/A 03/18/2015   Procedure: CORONARY ARTERY BYPASS GRAFTING (CABG) x  three, using left internal mammary artery and right leg greater saphenous vein harvested endoscopically;  Surgeon: Melrose Nakayama, MD;  Location: [facility];  Service: Open Heart Surgery;  Laterality: N/A;  . INGUINAL HERNIA REPAIR Bilateral   . INTRAOPERATIVE TRANSESOPHAGEAL ECHOCARDIOGRAM N/A 03/18/2015   Procedure: INTRAOPERATIVE TRANSESOPHAGEAL ECHOCARDIOGRAM;  Surgeon: Melrose Nakayama, MD;  Location: [facility];  Service: Open Heart Surgery;  Laterality: N/A;  . open heart surgery  2016  . [place] Medications    Prior to Admission medications   Medication Sig Start Date End Date Taking? Authorizing Provider  aspirin EC 325 MG EC tablet Take 1 tablet (325 mg total) by mouth daily. 01/18/16  Yes Rosalin Hawking, MD  atorvastatin (LIPITOR) 80 MG tablet Take 1 tablet (80 mg total) by mouth daily at 6 PM. 01/18/16  Yes Rosalin Hawking, MD  carvedilol (COREG) 6.25 MG tablet TAKE 1 TABLET (6.25 MG TOTAL) BY MOUTH 2 (TWO) TIMES DAILY. 06/05/16  Yes Martinique, Peter M, MD  KRILL OIL PO Take 1 capsule by mouth daily.    Yes [provider]  lisinopril (PRINIVIL,ZESTRIL) 20 MG tablet Take 1 tablet (20 mg total) by mouth daily. 01/18/16  Yes Rosalin Hawking, MD  metFORMIN (GLUCOPHAGE) 1000 MG tablet Take 1 tablet (1,000 mg  total) by mouth 2 (two) times daily. 01/18/16  Yes Rosalin Hawking, MD  Multiple Vitamin (MULTIVITAMIN) tablet Take 1 tablet by mouth daily. Centrium Silver once daily    Yes [provider]  vitamin C (ASCORBIC ACID) 500 MG tablet Take 500 mg by mouth daily.   Yes [provider]  glucose blood (FREESTYLE LITE) test strip Check Blood Sugars 1-2 times per day. DX: E11.9. 01/10/16   [name], [name], MD  HYDROcodone-acetaminophen (NORCO/VICODIN) 5-325 MG tablet Take 1 tablet  by mouth every 6 (six) hours as needed for moderate pain. Patient not taking: Reported on 09/02/2016 02/01/16   Eulas Post, MD  Lancets (FREESTYLE) lancets Check Blood Sugars 1-2 times per day. DX: E11.9 01/10/16   [name], [name], MD    Family History Family History  Problem Relation Age of Onset  . Diabetes Paternal Aunt   . Colon cancer Neg Hx     Social History Social History  Substance Use Topics  . Smoking status: Former Smoker    Packs/day: 1.50    Years: 20.00    Types: Cigarettes    Quit date: 08/24/1986  . Smokeless tobacco: Never Used  . Alcohol use 4.2 oz/week    7 Glasses of wine per week     Comment: occasionally     Allergies   Patient has no known allergies.   Review of Systems Review of Systems  Constitutional: Negative for chills and fever.  HENT: Negative for congestion.   Eyes: Negative for visual disturbance.  Respiratory: Positive for shortness of breath.   Cardiovascular: Negative for chest pain.  Gastrointestinal: Negative for abdominal pain and vomiting.  Genitourinary: Negative for dysuria and flank pain.  Musculoskeletal: Negative for back pain, neck pain and neck stiffness.  Skin: Positive for rash.  Neurological: Negative for light-headedness and headaches.     Physical Exam Updated Vital Signs BP (!) 161/95 (BP Location: Right Arm)   Pulse 71   Resp 20   SpO2 100%   Physical Exam  Constitutional: He is oriented to person, place, and time. He appears well-developed and well-nourished. He appears distressed.  HENT:  Head: Normocephalic.  Patient has significant tongue swelling mild muffled voice on arrival. Neck supple. No stridor mild wheezing lung fields.  Eyes: Conjunctivae are normal. Right eye exhibits no discharge. Left eye exhibits no discharge.  Neck: Normal range of motion. Neck supple. No tracheal deviation present.  Cardiovascular: Normal rate and regular rhythm.   Pulmonary/Chest: Effort normal. He has  wheezes.  Abdominal: Soft. He exhibits no distension. There is no tenderness. There is no guarding.  Musculoskeletal: He exhibits no edema.  Neurological: He is alert and oriented to person, place, and time.  Skin: Skin is warm. Rash noted.  Hives on arms bilateral  Psychiatric: He has a normal mood and affect.  Nursing note and vitals reviewed.    ED Treatments / Results  Labs (all labs ordered are listed, but only abnormal results are displayed) Labs Reviewed  BASIC METABOLIC PANEL - Abnormal; Notable for the following:       Result Value   Glucose, Bld 120 (*)    All other components within normal limits  CBC WITH DIFFERENTIAL/PLATELET    EKG  EKG Interpretation None       Radiology No results found.  Procedures Procedures (including critical care time) CRITICAL CARE Performed by: Mariea Clonts   Total critical care time: 75 minutes  Critical care time was exclusive of separately billable  procedures and treating other patients.  Critical care was necessary to treat or prevent imminent or life-threatening deterioration.  Critical care was time spent personally by me on the following activities: development of treatment plan with patient and/or surrogate as well as nursing, discussions with consultants, evaluation of patient's response to treatment, examination of patient, obtaining history from patient or surrogate, ordering and performing treatments and interventions, ordering and review of laboratory studies, ordering and review of radiographic studies, pulse oximetry and re-evaluation of patient's condition. Medications Ordered in ED Medications  0.9 %  sodium chloride infusion ( Intravenous New Bag/Given 09/02/16 1644)  albuterol (PROVENTIL) (2.5 MG/3ML) 0.083% nebulizer solution 5 mg (not administered)  EPINEPHrine (EPI-PEN) injection 0.3 mg (not administered)  methylPREDNISolone sodium succinate (SOLU-MEDROL) 125 mg/2 mL injection (125 mg  Given 09/02/16 1635)   diphenhydrAMINE (BENADRYL) 50 MG/ML injection (50 mg  Given 09/02/16 1640)  famotidine (PEPCID) IVPB 20 mg premix (20 mg Intravenous New Bag/Given 09/02/16 1657)  EPINEPHrine (EPI-PEN) injection 0.3 mg (0.3 mg Intramuscular Given 09/02/16 1630)  EPINEPHrine (EPI-PEN) injection 0.3 mg (0.3 mg Intramuscular Given 09/02/16 1659)     Initial Impression / Assessment and Plan / ED Course  I have reviewed the triage vital signs and the nursing notes.  Pertinent labs & imaging results that were available during my care of the patient were reviewed by me and considered in my medical decision making (see chart for details).    Patient presents with angioedema mixed picture with components of both allergic versus ACE inhibitor induced. Patient received aggressive allergic treatment with mild improvement. Discussed with ENT recommended critical care consult them if surgical airway needed. Discussed with critical care for admission who agreed. Recheck the patient multiple times. Patient received 2 epinephrine doses in the ER.  The patients results and plan were reviewed and discussed.   Any x-rays performed were independently reviewed by myself.   Differential diagnosis were considered with the presenting HPI.  Medications  0.9 %  sodium chloride infusion ( Intravenous New Bag/Given 09/02/16 1644)  albuterol (PROVENTIL) (2.5 MG/3ML) 0.083% nebulizer solution 5 mg (not administered)  EPINEPHrine (EPI-PEN) injection 0.3 mg (not administered)  methylPREDNISolone sodium succinate (SOLU-MEDROL) 125 mg/2 mL injection (125 mg  Given 09/02/16 1635)  diphenhydrAMINE (BENADRYL) 50 MG/ML injection (50 mg  Given 09/02/16 1640)  famotidine (PEPCID) IVPB 20 mg premix (20 mg Intravenous New Bag/Given 09/02/16 1657)  EPINEPHrine (EPI-PEN) injection 0.3 mg (0.3 mg Intramuscular Given 09/02/16 1630)  EPINEPHrine (EPI-PEN) injection 0.3 mg (0.3 mg Intramuscular Given 09/02/16 1659)    Vitals:   09/02/16 1640  BP: (!) 161/95  Pulse:  71  Resp: 20  SpO2: 100%    Final diagnoses:  Angioedema, initial encounter    Admission/ observation were discussed with the admitting physician, patient and/or family and they are comfortable with the plan.   Final Clinical Impressions(s) / ED Diagnoses   Final diagnoses:  Angioedema, initial encounter    New Prescriptions New Prescriptions   No medications on file     Elnora Morrison, MD 09/02/16 [phone]

## 2016-09-02 NOTE — H&P (Signed)
Miguel Vazquez is an 76 y.o. male.   Chief Complaint: Tongue swelling HPI:   This is a 76 year old male with past medical history of coronary artery disease status post CABG one half year ago, diabetes, hypertension who was visiting his son who is admitted in the hospital and rehabilitation. He had his medication today morning at 10:00 along with having food in the cafeteria of the hospital. He ate scrambled eggs, bacon, sausage and muffin. Around 4 PM he started having some swelling and difficulty with speech. He also had some hives that started in the right arm. He denies having any known allergies. Denies any insect bite. He was given Solu-Medrol and epinephrine 2 and he is already started to feel better by the time I saw him. Although he still has hot potato speech  Denies any chest pain no nausea vomiting no urinary bowel complaints no dizziness nor lightheadedness no vision problems no abdominal pain or pedal edema. No recent history of any fever or chills or cough. No weight loss.  No new medications. He has been on lisinopril for years now.  Past Medical History:  Diagnosis Date  . Arthritis   . Cataract   . Coronary artery disease   . Diabetes mellitus   . GERD (gastroesophageal reflux disease)   . Hx of abdominal aortic aneurysm 1998  . Hyperlipidemia   . Hypertension   . Personal history of colonic polyps - adenomas 09/09/2013    Past Surgical History:  Procedure Laterality Date  . ABDOMINAL AORTIC ANEURYSM REPAIR  1998  . CARDIAC CATHETERIZATION N/A 03/18/2015   Procedure: Left Heart Cath and Coronary Angiography;  Surgeon: Peter M Martinique, MD;  Location: [facility];  Service: Cardiovascular;  Laterality: N/A;  . cataract surgery Right   . COLONOSCOPY    . CORONARY ARTERY BYPASS GRAFT N/A 03/18/2015   Procedure: CORONARY ARTERY BYPASS GRAFTING (CABG) x  three, using left internal mammary artery and right leg greater saphenous vein harvested endoscopically;  Surgeon:  Melrose Nakayama, MD;  Location: [facility];  Service: Open Heart Surgery;  Laterality: N/A;  . INGUINAL HERNIA REPAIR Bilateral   . INTRAOPERATIVE TRANSESOPHAGEAL ECHOCARDIOGRAM N/A 03/18/2015   Procedure: INTRAOPERATIVE TRANSESOPHAGEAL ECHOCARDIOGRAM;  Surgeon: Melrose Nakayama, MD;  Location: [facility];  Service: Open Heart Surgery;  Laterality: N/A;  . open heart surgery  2016  . UMBILICAL HERNIA REPAIR  1965    Family History  Problem Relation Age of Onset  . Diabetes Paternal Aunt   . Colon cancer Neg Hx    Social History:  reports that he quit smoking about 30 years ago. His smoking use included Cigarettes. He has a 30.00 pack-year smoking history. He has never used smokeless tobacco. He reports that he drinks about 4.2 oz of alcohol per week . He reports that he does not use drugs.  Allergies: No Known Allergies   (Not in a hospital admission)  Results for orders placed or performed during the hospital encounter of 09/02/16 (from the past 48 hour(s))  CBC with Differential     Status: None   Collection Time: 09/02/16  4:30 PM  Result Value Ref Range   WBC 8.9 4.0 - 10.5 K/uL   RBC 5.05 4.22 - 5.81 MIL/uL   Hemoglobin 16.9 13.0 - 17.0 g/dL   HCT 48.4 39.0 - 52.0 %   MCV 95.8 78.0 - 100.0 fL   MCH 33.5 26.0 - 34.0 pg   MCHC 34.9 30.0 - 36.0 g/dL  RDW 13.6 11.5 - 15.5 %   Platelets 178 150 - 400 K/uL   Neutrophils Relative % 61 %   Neutro Abs 5.5 1.7 - 7.7 K/uL   Lymphocytes Relative 25 %   Lymphs Abs 2.2 0.7 - 4.0 K/uL   Monocytes Relative 6 %   Monocytes Absolute 0.5 0.1 - 1.0 K/uL   Eosinophils Relative 7 %   Eosinophils Absolute 0.6 0.0 - 0.7 K/uL   Basophils Relative 1 %   Basophils Absolute 0.0 0.0 - 0.1 K/uL  Basic metabolic panel     Status: Abnormal   Collection Time: 09/02/16  4:30 PM  Result Value Ref Range   Sodium 141 135 - 145 mmol/L   Potassium 4.4 3.5 - 5.1 mmol/L   Chloride 105 101 - 111 mmol/L   CO2 25 22 - 32 mmol/L   Glucose, Bld 120 (H) 65 -  99 mg/dL   BUN 15 6 - 20 mg/dL   Creatinine, Ser 1.09 0.61 - 1.24 mg/dL   Calcium 9.5 8.9 - 10.3 mg/dL   GFR calc non Af Amer >60 >60 mL/min   GFR calc Af Amer >60 >60 mL/min    Comment: (NOTE) The eGFR has been calculated using the CKD EPI equation. This calculation has not been validated in all clinical situations. eGFR's persistently <60 mL/min signify possible Chronic Kidney Disease.    Anion gap 11 5 - 15   No results found.  Review of Systems  All other systems reviewed and are negative.   Blood pressure (!) 161/95, pulse 71, resp. rate 20, SpO2 100 %. Physical Exam  Nursing note and vitals reviewed. Constitutional: He is oriented to person, place, and time. He appears well-developed and well-nourished. No distress.  HENT:  Head: Normocephalic and atraumatic.  Right Ear: External ear normal.  Left Ear: External ear normal.  Mouth/Throat: Oropharynx is clear and moist. No oropharyngeal exudate.  Tongue swollen but able to protrude out. He has hot potato speech.  Eyes: EOM are normal. Pupils are equal, round, and reactive to light. Right eye exhibits no discharge. Left eye exhibits no discharge. No scleral icterus.  Neck: Normal range of motion. Neck supple. No JVD present. No tracheal deviation present. No thyromegaly present.  Cardiovascular: Normal rate, regular rhythm and normal heart sounds.  Exam reveals no gallop and no friction rub.   No murmur heard. Respiratory: Effort normal and breath sounds normal. No respiratory distress. He has no wheezes. He has no rales. He exhibits no tenderness.  GI: Soft. Bowel sounds are normal. He exhibits no distension. There is no tenderness. There is no rebound.  Musculoskeletal: Normal range of motion. He exhibits no edema, tenderness or deformity.  Neurological: He is alert and oriented to person, place, and time. He displays normal reflexes. No cranial nerve deficit. He exhibits normal muscle tone. Coordination normal.  Skin: Skin  is warm. He is not diaphoretic.  Psychiatric: His behavior is normal.     Assessment/Plan  Angioedema Coronary artery disease status post CABG Hypertension Diabetes mellitus  Plan:  Admit for observation in the ICU to ensure airway doesn't get worse Epinephrine at bedside Will give Solu-Medrol and Benadryl every 6 and Pepcid every 12 Will switch most of his by mouth meds to either IV or rectally. Gentle IV fluids. Hold metformin. ICU insulin glycemic scale GI and DVT prophylaxis I will hold his lisinopril although it's not clear whether his angioedema is related to his lisinopril or to something he ate. It is  not exactly clear why he developed angioedema. I don't think this is a case of hereditary angioedema so I will not send C1 esterase.  Family has been educated on emergent use of Epipen at bedside.  STAFF NOTE: I, Sharia Reeve, MD have personally reviewed patient's available data, including medical history, events of note, physical examination and test results as part of my evaluation.  The patient is critically ill with multiple organ systems failure and requires high complexity decision making for assessment and support, frequent evaluation and titration of therapies, application of advanced monitoring technologies and extensive interpretation of multiple databases.   Critical Care Time devoted to patient care services described in this note is 31  Minutes. This time reflects time of care of this signee: Sharia Reeve, MD.    Sharia Reeve, MD 09/02/2016, 6:09 PM

## 2016-09-02 NOTE — ED Triage Notes (Addendum)
Pt coming to ER from upstairs where patient noticed hives to right arm that began to spread up arm with oral swelling and speech became muffled. Pt is alert and speaking, unable to understand speech clearly.No wheezing heard in triage, however patient O2 sats 89% on RA. Taken to Tra [facility].

## 2016-09-02 NOTE — ED Notes (Signed)
Pt's hives have faded, tongue remains swollen. No difficulty swallowing, son at bedside.

## 2016-09-02 NOTE — ED Notes (Signed)
Epi pen 0.3mg  given in right thigh.

## 2016-09-03 ENCOUNTER — Ambulatory Visit: Payer: PPO | Admitting: Family Medicine

## 2016-09-03 ENCOUNTER — Encounter (HOSPITAL_COMMUNITY): Payer: Self-pay

## 2016-09-03 DIAGNOSIS — E119 Type 2 diabetes mellitus without complications: Secondary | ICD-10-CM | POA: Diagnosis not present

## 2016-09-03 DIAGNOSIS — Z7982 Long term (current) use of aspirin: Secondary | ICD-10-CM | POA: Diagnosis not present

## 2016-09-03 DIAGNOSIS — T781XXA Other adverse food reactions, not elsewhere classified, initial encounter: Secondary | ICD-10-CM | POA: Diagnosis not present

## 2016-09-03 DIAGNOSIS — T508X5A Adverse effect of diagnostic agents, initial encounter: Secondary | ICD-10-CM | POA: Diagnosis not present

## 2016-09-03 DIAGNOSIS — I10 Essential (primary) hypertension: Secondary | ICD-10-CM | POA: Diagnosis not present

## 2016-09-03 DIAGNOSIS — Z87891 Personal history of nicotine dependence: Secondary | ICD-10-CM | POA: Diagnosis not present

## 2016-09-03 DIAGNOSIS — X58XXXA Exposure to other specified factors, initial encounter: Secondary | ICD-10-CM | POA: Diagnosis not present

## 2016-09-03 DIAGNOSIS — Z951 Presence of aortocoronary bypass graft: Secondary | ICD-10-CM | POA: Diagnosis not present

## 2016-09-03 DIAGNOSIS — G9340 Encephalopathy, unspecified: Secondary | ICD-10-CM | POA: Diagnosis not present

## 2016-09-03 DIAGNOSIS — Z79899 Other long term (current) drug therapy: Secondary | ICD-10-CM | POA: Diagnosis not present

## 2016-09-03 DIAGNOSIS — T783XXA Angioneurotic edema, initial encounter: Principal | ICD-10-CM

## 2016-09-03 DIAGNOSIS — Z833 Family history of diabetes mellitus: Secondary | ICD-10-CM | POA: Diagnosis not present

## 2016-09-03 DIAGNOSIS — K219 Gastro-esophageal reflux disease without esophagitis: Secondary | ICD-10-CM | POA: Diagnosis not present

## 2016-09-03 DIAGNOSIS — E785 Hyperlipidemia, unspecified: Secondary | ICD-10-CM | POA: Diagnosis not present

## 2016-09-03 DIAGNOSIS — T450X5A Adverse effect of antiallergic and antiemetic drugs, initial encounter: Secondary | ICD-10-CM | POA: Diagnosis not present

## 2016-09-03 DIAGNOSIS — L8990 Pressure ulcer of unspecified site, unspecified stage: Secondary | ICD-10-CM | POA: Insufficient documentation

## 2016-09-03 DIAGNOSIS — I2510 Atherosclerotic heart disease of native coronary artery without angina pectoris: Secondary | ICD-10-CM | POA: Diagnosis not present

## 2016-09-03 DIAGNOSIS — Z8673 Personal history of transient ischemic attack (TIA), and cerebral infarction without residual deficits: Secondary | ICD-10-CM | POA: Diagnosis not present

## 2016-09-03 DIAGNOSIS — R410 Disorientation, unspecified: Secondary | ICD-10-CM | POA: Diagnosis not present

## 2016-09-03 DIAGNOSIS — Z7984 Long term (current) use of oral hypoglycemic drugs: Secondary | ICD-10-CM | POA: Diagnosis not present

## 2016-09-03 DIAGNOSIS — Z86010 Personal history of colonic polyps: Secondary | ICD-10-CM | POA: Diagnosis not present

## 2016-09-03 DIAGNOSIS — T464X5A Adverse effect of angiotensin-converting-enzyme inhibitors, initial encounter: Secondary | ICD-10-CM | POA: Diagnosis not present

## 2016-09-03 DIAGNOSIS — Z8679 Personal history of other diseases of the circulatory system: Secondary | ICD-10-CM | POA: Diagnosis not present

## 2016-09-03 LAB — CBC
HCT: 42.1 % (ref 39.0–52.0)
Hemoglobin: 14.1 g/dL (ref 13.0–17.0)
MCH: 32.2 pg (ref 26.0–34.0)
MCHC: 33.5 g/dL (ref 30.0–36.0)
MCV: 96.1 fL (ref 78.0–100.0)
Platelets: 138 10*3/uL — ABNORMAL LOW (ref 150–400)
RBC: 4.38 MIL/uL (ref 4.22–5.81)
RDW: 13.5 % (ref 11.5–15.5)
WBC: 7.8 10*3/uL (ref 4.0–10.5)

## 2016-09-03 LAB — BASIC METABOLIC PANEL
Anion gap: 6 (ref 5–15)
BUN: 20 mg/dL (ref 6–20)
CALCIUM: 8.6 mg/dL — AB (ref 8.9–10.3)
CO2: 27 mmol/L (ref 22–32)
CREATININE: 1.01 mg/dL (ref 0.61–1.24)
Chloride: 106 mmol/L (ref 101–111)
GFR calc non Af Amer: >60 mL/min (ref >60)
Glucose, Bld: 163 mg/dL — ABNORMAL HIGH (ref 65–99)
Potassium: 4.4 mmol/L (ref 3.5–5.1)
SODIUM: 139 mmol/L (ref 135–145)

## 2016-09-03 LAB — GLUCOSE, CAPILLARY
GLUCOSE-CAPILLARY: 122 mg/dL — AB (ref 65–99)
Glucose-Capillary: 138 mg/dL — ABNORMAL HIGH (ref 65–99)
Glucose-Capillary: 152 mg/dL — ABNORMAL HIGH (ref 65–99)
Glucose-Capillary: 185 mg/dL — ABNORMAL HIGH (ref 65–99)
Glucose-Capillary: 181 mg/dL — ABNORMAL HIGH (ref 65–99)

## 2016-09-03 LAB — PHOSPHORUS: PHOSPHORUS: 3.1 mg/dL (ref 2.5–4.6)

## 2016-09-03 LAB — MAGNESIUM: MAGNESIUM: 1.2 mg/dL — AB (ref 1.7–2.4)

## 2016-09-03 MED ORDER — LISINOPRIL 20 MG PO TABS
20.0000 mg | ORAL_TABLET | Freq: Every day | ORAL | Status: DC
  Administered 2016-09-03 – 2016-09-04 (×2): 20 mg via ORAL
  Filled 2016-09-03 (×2): qty 1

## 2016-09-03 MED ORDER — INSULIN ASPART 100 UNIT/ML ~~LOC~~ SOLN
0.0000 [IU] | Freq: Three times a day (TID) | SUBCUTANEOUS | Status: DC
Start: 2016-09-03 — End: 2016-09-04
  Administered 2016-09-03: 3 [IU] via SUBCUTANEOUS
  Administered 2016-09-04: 2 [IU] via SUBCUTANEOUS

## 2016-09-03 MED ORDER — METOPROLOL TARTRATE 5 MG/5ML IV SOLN: 2.5000 mg | Freq: Four times a day (QID) | INTRAVENOUS | Status: DC | PRN

## 2016-09-03 MED ORDER — METHYLPREDNISOLONE SODIUM SUCC 40 MG IJ SOLR
20.0000 mg | Freq: Three times a day (TID) | INTRAMUSCULAR | Status: DC
  Filled 2016-09-03: qty 0.5

## 2016-09-03 MED ORDER — HALOPERIDOL LACTATE 5 MG/ML IJ SOLN
2.0000 mg | Freq: Once | INTRAMUSCULAR | Status: AC
  Administered 2016-09-03: 2 mg via INTRAVENOUS

## 2016-09-03 MED ORDER — MAGNESIUM SULFATE 2 GM/50ML IV SOLN
2.0000 g | Freq: Once | INTRAVENOUS | Status: AC
  Administered 2016-09-03: 2 g via INTRAVENOUS
  Filled 2016-09-03: qty 50

## 2016-09-03 MED ORDER — HALOPERIDOL LACTATE 5 MG/ML IJ SOLN
INTRAMUSCULAR | Status: AC
  Filled 2016-09-03: qty 1

## 2016-09-03 MED ORDER — METOPROLOL TARTRATE 12.5 MG HALF TABLET: 12.5000 mg | ORAL_TABLET | Freq: Two times a day (BID) | ORAL | Status: DC

## 2016-09-03 MED ORDER — HALOPERIDOL LACTATE 5 MG/ML IJ SOLN: 2.0000 mg | Freq: Four times a day (QID) | INTRAMUSCULAR | Status: DC | PRN

## 2016-09-03 MED ORDER — DIPHENHYDRAMINE HCL 25 MG PO CAPS: 25.0000 mg | ORAL_CAPSULE | Freq: Two times a day (BID) | ORAL | Status: DC

## 2016-09-03 MED ORDER — ASPIRIN 325 MG PO TABS
325.0000 mg | ORAL_TABLET | Freq: Every day | ORAL | Status: DC
  Administered 2016-09-04: 325 mg via ORAL
  Filled 2016-09-03 (×2): qty 1

## 2016-09-03 NOTE — Care Management Obs Status (Signed)
[facility] NOTIFICATION   Patient Details  Name: Miguel Vazquez MRN: [id number] Date of Birth: [birth year]   Medicare Observation Status Notification Given:  Yes OBS letter placed in room for family who are currently meeting with a physician . Noted with CM number left for family to call for questions and discussion of OBS status if desired.    Pallas Wahlert, Rory Percy, RN 09/03/2016, 2:36 PM

## 2016-09-03 NOTE — Progress Notes (Signed)
S:  Called to bedside to assess patient with delirium.  RN reports pt has had several episodes of agitation (to the point of almost swinging at the staff).    O: Blood pressure 139/77, pulse 72, temperature 97.4 F (36.3 C), resp. rate (!) 21, weight 167 lb 5.3 oz (75.9 kg), SpO2 93 %.  General:  Elderly male, in NAD physical distress Neuro:  Anxious, pressured speech, standing up intermittently (unsteady on feet).  Speech clear, MAE   A: Acute Delirium - suspect related to steroids and benadryl  Resolving Angioedema - no further swelling  P: Now haldol 2mg  IV x1  Haldol 2mg  IV Q6 PRN  May need to transfer back to ICU/SDU Frequent reorientation  Safety sitter at bedside  Noe Gens, NP-C [facility] Pgr: [phone] or if no answer [phone] 09/03/2016, 4:24 PM

## 2016-09-03 NOTE — Progress Notes (Signed)
   Name: Miguel Vazquez MRN: [id number] DOB: 04-21-1940    ADMISSION DATE:  09/02/2016 CONSULTATION DATE:  09/02/16  REFERRING MD :  Dr. Reather Converse  CHIEF COMPLAINT:  Angioedema    BRIEF SUMMARY:  76 y/o M admitted on 6/3 with complaints of tongue swelling and hives.  Admitted to ICU for observation.   SUBJECTIVE: Wife reports pt is confused > states he is "always a little odd but this is worse than normal".  Pt reports improvement in swelling.  RN reports need for mittens to keep patient from pulling lines out.   VITAL SIGNS: Temp:  [97.7 F (36.5 C)-98.4 F (36.9 C)] 98.3 F (36.8 C) (06/04 0732) Pulse Rate:  [55-86] 60 (06/04 1100) Resp:  [14-24] 24 (06/04 1100) BP: (112-173)/(72-156) 149/104 (06/04 1100) SpO2:  [90 %-100 %] 98 % (06/04 1100) Weight:  [167 lb 5.3 oz (75.9 kg)] 167 lb 5.3 oz (75.9 kg) (06/04 0436)  PHYSICAL EXAMINATION: General: elderly male in NAD, sitting up in chair, mittens in place HEENT: MM pink/moist, no jvd, no tongue / lip swelling  PSY: talkative, mildly agitated Neuro: Awake, alert, oriented to self, place, time / events, MAE  CV: s1s2 rrr, no m/r/g PULM: even/non-labored, lungs bilaterally clear  HE:RDEY, non-tender, bsx4 active  Extremities: warm/dry, no edema  Skin: no rashes or lesions    Recent Labs Lab 09/02/16 1630 09/03/16 0243  NA 141 139  K 4.4 4.4  CL 105 106  CO2 25 27  BUN 15 20  CREATININE 1.09 1.01  GLUCOSE 120* 163*     Recent Labs Lab 09/02/16 1630 09/03/16 0243  HGB 16.9 14.1  HCT 48.4 42.1  WBC 8.9 7.8  PLT 178 138*    No results found.    SIGNIFICANT EVENTS  6/03  Admit with angioedema   STUDIES:      ASSESSMENT / PLAN:  Discussion:  76 y/o M with PMH of DM, HTN on lisinopril admitted 6/3 with acute onset of tongue swelling and hives after eating breakfast at a hospital cafeteria (visiting his son in rehab who recently had a stroke).  The patients swelling quickly improved but he developed delirium  thought related to benadryl + steroids.      Angioedema - resolving, ? ACE-I related vs food intake P: Discontinue Solumedrol & Benadryl with delirium Continue pepcid Q12 Transfer to medical floor with safety sitter Advance diet to carb modified, heart healthy  HTN P: Restart lisinopril (had histamine response with hives), will re-challenge ACE use Lopressor 2.5 mg Q6 PRN for SBP >170 Reduce LR to 56ml/hr > consider KVO when tolerating diet  DM  P: SSI  Hold home Metformin   Confusion / Delirium - ? If steroids + benadryl are exacerbating delirium  P: Reduce benadryl as above as may be contributing to delirium  Promote sleep / wake cycle If symptoms not improved with reduction of medications, consider CT head (last in 01/2016) Assess UA to ensure no infectious source of delirium    Transfer to medical floor, TRH SVC.    Noe Gens, NP-C [facility] Pgr: [phone] or if no answer [phone] 09/03/2016, 11:35 AM

## 2016-09-04 LAB — BASIC METABOLIC PANEL
ANION GAP: 7 (ref 5–15)
BUN: 15 mg/dL (ref 6–20)
CALCIUM: 8.8 mg/dL — AB (ref 8.9–10.3)
CO2: 27 mmol/L (ref 22–32)
Chloride: 106 mmol/L (ref 101–111)
Creatinine, Ser: 0.98 mg/dL (ref 0.61–1.24)
Glucose, Bld: 123 mg/dL — ABNORMAL HIGH (ref 65–99)
Potassium: 3.7 mmol/L (ref 3.5–5.1)
Sodium: 140 mmol/L (ref 135–145)

## 2016-09-04 LAB — CBC
HCT: 36.9 % — ABNORMAL LOW (ref 39.0–52.0)
Hemoglobin: 12.4 g/dL — ABNORMAL LOW (ref 13.0–17.0)
MCH: 31.8 pg (ref 26.0–34.0)
MCHC: 33.6 g/dL (ref 30.0–36.0)
MCV: 94.6 fL (ref 78.0–100.0)
PLATELETS: 134 10*3/uL — AB (ref 150–400)
RBC: 3.9 MIL/uL — AB (ref 4.22–5.81)
RDW: 13.6 % (ref 11.5–15.5)
WBC: 9 10*3/uL (ref 4.0–10.5)

## 2016-09-04 LAB — URINALYSIS, ROUTINE W REFLEX MICROSCOPIC
Bacteria, UA: NONE SEEN
Bilirubin Urine: NEGATIVE
GLUCOSE, UA: NEGATIVE mg/dL
HGB URINE DIPSTICK: NEGATIVE
Ketones, ur: 20 mg/dL — AB
Leukocytes, UA: NEGATIVE
NITRITE: NEGATIVE
PH: 5 (ref 5.0–8.0)
PROTEIN: 30 mg/dL — AB
Specific Gravity, Urine: 1.023 (ref 1.005–1.030)

## 2016-09-04 LAB — GLUCOSE, CAPILLARY
GLUCOSE-CAPILLARY: 129 mg/dL — AB (ref 65–99)
GLUCOSE-CAPILLARY: 171 mg/dL — AB (ref 65–99)

## 2016-09-04 MED ORDER — EPINEPHRINE 0.3 MG/0.3ML IJ SOAJ
0.3000 mg | INTRAMUSCULAR | 2 refills | Status: DC | PRN
Start: 2016-09-04 — End: 2018-10-30

## 2016-09-04 NOTE — Progress Notes (Signed)
   Name: Miguel Vazquez MRN: [id number] DOB: January 28, 1941    ADMISSION DATE:  09/02/2016 CONSULTATION DATE:  09/02/16  REFERRING MD :  Dr. Reather Converse  CHIEF COMPLAINT:  Angioedema    BRIEF SUMMARY:  76 y/o M admitted on 6/3 with complaints of tongue swelling and hives.  Admitted to ICU for observation. Transferred to floor, and ready for d/c home 6/4  SUBJECTIVE: Awake and alert and oriented x 2. Some confusion regarding day of week, but re-orients easily and remembers date he was told. Ready to go home and sleep in his own bed.  VITAL SIGNS: Temp:  [97.4 F (36.3 C)-98.5 F (36.9 C)] 97.9 F (36.6 C) (06/05 0423) Pulse Rate:  [59-73] 60 (06/05 0423) Resp:  [14-24] 18 (06/05 0423) BP: (108-162)/(54-104) 135/74 (06/05 0839) SpO2:  [93 %-100 %] 100 % (06/05 0423) Weight:  [166 lb 1.6 oz (75.3 kg)-167 lb 5.3 oz (75.9 kg)] 166 lb 1.6 oz (75.3 kg) (06/05 0444)  PHYSICAL EXAMINATION: General: elderly male in NAD, sitting up in chair,appropriate, ready to go home  HEENT: MM pink/moist, no jvd, no tongue / lip swelling , no stridor PSY: talkative, appropriate, no aggitation Neuro: Awake and alert,  Oriented to place and self, slight confusion regarding date and day of week. MAE x 4, appropriate  CV: RRR, S1, S2, no RMG PULM: even/non-labored, lungs bilaterally clear throughout, diminished per bases. WU:JWJX, non-tender, non-distended, bsx4 active, Obese  Extremities: warm/dry, no edema , no obvious deformities Skin:Intact, no rashes or lesions, no tattoos or piercings    Recent Labs Lab 09/02/16 1630 09/03/16 0243 09/04/16 0627  NA 141 139 140  K 4.4 4.4 3.7  CL 105 106 106  CO2 25 27 27   BUN 15 20 15   CREATININE 1.09 1.01 0.98  GLUCOSE 120* 163* 123*     Recent Labs Lab 09/02/16 1630 09/03/16 0243 09/04/16 0627  HGB 16.9 14.1 12.4*  HCT 48.4 42.1 36.9*  WBC 8.9 7.8 9.0  PLT 178 138* 134*    No results found.    SIGNIFICANT EVENTS  6/03  Admit with angioedema    6/05  For d/c/home  STUDIES:      ASSESSMENT / PLAN:  Discussion:  76 y/o M with PMH of DM, HTN on lisinopril admitted 6/3 with acute onset of tongue swelling and hives after eating breakfast at a hospital cafeteria (visiting his son in rehab who recently had a stroke).  The patients swelling quickly improved but he developed delirium thought related to benadryl + steroids. Both were stopped.He received one dose of Haldol and has been clear since. RN thinks this was a reaction to the steroids. Lisinopril restarted with no swelling or edema noted x 2 days  Angioedema - resolved, ? ACE-I related vs food intake vs histamine mediated reaction P: No further  Solumedrol & Benadryl with delirium Restarted Lisinopril in the hospital with no swelling or issues x 2 days Frequent re-orientation Continue pepcid Q12 Advance diet to carb modified, heart healthy Consider allergy evaluation as outpatient OK to discharge from PCCM perspective   Confusion / Delirium - ? If steroids + benadryl are exacerbating delirium  Cleared P: Promote sleep / wake cycle   OK to DC home per PCCM, all additional care  per Primary service    Magdalen Spatz, AGACNP-BC [place] Pgr:  [phone] 09/04/2016, 9:30 AM

## 2016-09-04 NOTE — Progress Notes (Signed)
Miguel Vazquez to be D/C'd Home per MD order.  Discussed with the patient and all questions fully answered.  VSS, Skin clean, dry and intact without evidence of skin break down, no evidence of skin tears noted. IV catheter discontinued intact. Site without signs and symptoms of complications. Dressing and pressure applied.  An After Visit Summary was printed and given to the patient. Patient received prescription.  D/c education completed with patient/family including follow up instructions, medication list, d/c activities limitations if indicated, with other d/c instructions as indicated by MD - patient able to verbalize understanding, all questions fully answered.   Patient instructed to return to ED, call 911, or call MD for any changes in condition.   Patient escorted via [place], and D/C home via private auto.  Allergies as of 09/04/2016      Reactions   Venomil Honey Bee Venom [honey Bee Venom]       Medication List    STOP taking these medications   HYDROcodone-acetaminophen 5-325 MG tablet Commonly known as:  NORCO/VICODIN     TAKE these medications   aspirin 325 MG EC tablet Take 1 tablet (325 mg total) by mouth daily.   atorvastatin 80 MG tablet Commonly known as:  LIPITOR Take 1 tablet (80 mg total) by mouth daily at 6 PM.   carvedilol 6.25 MG tablet Commonly known as:  COREG TAKE 1 TABLET (6.25 MG TOTAL) BY MOUTH 2 (TWO) TIMES DAILY.   EPINEPHrine 0.3 mg/0.3 mL Soaj injection Commonly known as:  EPI-PEN Inject 0.3 mLs (0.3 mg total) into the muscle as needed (tongue swelling).   freestyle lancets Check Blood Sugars 1-2 times per day. DX: E11.9   glucose blood test strip Commonly known as:  FREESTYLE LITE Check Blood Sugars 1-2 times per day. DX: E11.9.   KRILL OIL PO Take 1 capsule by mouth daily.   lisinopril 20 MG tablet Commonly known as:  PRINIVIL,ZESTRIL Take 1 tablet (20 mg total) by mouth daily.   metFORMIN 1000 MG tablet Commonly known as:   GLUCOPHAGE Take 1 tablet (1,000 mg total) by mouth 2 (two) times daily.   multivitamin tablet Take 1 tablet by mouth daily. Centrium Silver once daily   vitamin C 500 MG tablet Commonly known as:  ASCORBIC ACID Take 500 mg by mouth daily.       Miguel Vazquez 09/04/2016 11:43 AM

## 2016-09-04 NOTE — Discharge Summary (Signed)
Physician Discharge Summary       Patient ID: Miguel Vazquez MRN: [id number] DOB/AGE: Jan 19, 1941 76 y.o.  Admit date: 09/02/2016 Discharge date: 09/04/2016  Discharge Diagnoses: Angioedema: resolved  Detailed Hospital Course:  76 y/o M with PMH of DM, HTN on lisinopril admitted 6/3 with acute onset of tongue swelling and hives after eating breakfast at a hospital cafeteria (visiting his son in rehab who recently had a stroke).  The patients swelling quickly improved but he developed delirium thought related to benadryl + steroids. Both were stopped.He received one dose of Haldol and has been clear since. RN thinks this was a reaction to the steroids. Lisinopril restarted with no swelling or edema noted x 2 days.   Discharge Plan by active problems   Angioedema-resolved, ? ACE-I related vs food intake vs histamine mediated reaction P: Restarted Lisinopril in the hospital with no swelling or issues x 2 days Advance diet to carb modified, heart healthy  Allergy evaluation as outpatient within 1 week of discharge OK to discharge 09/04/16 with EPI PEN from PCCM perspective   Confusion / Delirium - ? If steroids + benadryl are exacerbating delirium  Cleared P: Promote sleep / wake cycle Frequent re-orientation Avoid Steroids Significant Hospital tests/ studies  None  Discharge Exam: BP 135/74   Pulse 60   Temp 97.9 F (36.6 C) (Oral)   Resp 18   Ht 5\' 8"  (1.727 m)   Wt 166 lb 1.6 oz (75.3 kg)   SpO2 100%   BMI 25.26 kg/m   PHYSICAL EXAMINATION: General: elderly male in NAD, sitting up in chair,appropriate, ready to go home  HEENT: MM pink/moist, no jvd, no tongue / lip swelling , no stridor, no peri orbital edema PSY: talkative, appropriate, no aggitation Neuro: Awake and alert,  Oriented to place and self, slight confusion regarding date and day of week. MAE x 4, appropriate  CV: RRR, S1, S2, no RMG PULM: even/non-labored, lungs bilaterally clear throughout, diminished  per bases. AT:FTDD, non-tender, non-distended, bsx4 active, Obese  Extremities: warm/dry, no edema , no obvious deformities Skin:Intact, no rashes or lesions, no tattoos or piercings   Labs at discharge Lab Results  Component Value Date   CREATININE 0.98 09/04/2016   BUN 15 09/04/2016   NA 140 09/04/2016   K 3.7 09/04/2016   CL 106 09/04/2016   CO2 27 09/04/2016   Lab Results  Component Value Date   WBC 9.0 09/04/2016   HGB 12.4 (L) 09/04/2016   HCT 36.9 (L) 09/04/2016   MCV 94.6 09/04/2016   PLT 134 (L) 09/04/2016   Lab Results  Component Value Date   ALT 19 06/04/2016   AST 17 06/04/2016   ALKPHOS 64 06/04/2016   BILITOT 0.8 06/04/2016   Lab Results  Component Value Date   INR 1.63 (H) 03/18/2015   INR 1.01 03/18/2015    Current radiology studies No results found.  Disposition:  01-Home or Self Care  Discharge Instructions    Call MD for:  difficulty breathing, headache or visual disturbances    Complete by:  As directed    Call MD for:  extreme fatigue    Complete by:  As directed    Call MD for:  hives    Complete by:  As directed    Call MD for:  persistant dizziness or light-headedness    Complete by:  As directed    Call MD for:  temperature >100.4    Complete by:  As directed  Diet - low sodium heart healthy    Complete by:  As directed    Discharge instructions    Complete by:  As directed    Discharge home with Epi Pen. Use Epi Pen if you have another event where your breathing is compromised Resume regular heart smart diet Increase activity slowly, do not over do No driving x 3 days Please follow up with your PCP within 1 week of discharge Please arrange for Allergy testing with an allergist within 1 week of discharge Please seek emergency care for any difficulty breathing after using your EPI pen   Increase activity slowly    Complete by:  As directed      Allergies as of 09/04/2016      Reactions   Venomil Honey Bee Venom [honey  Bee Venom]       Medication List    STOP taking these medications   HYDROcodone-acetaminophen 5-325 MG tablet Commonly known as:  NORCO/VICODIN     TAKE these medications   aspirin 325 MG EC tablet Take 1 tablet (325 mg total) by mouth daily.   atorvastatin 80 MG tablet Commonly known as:  LIPITOR Take 1 tablet (80 mg total) by mouth daily at 6 PM.   carvedilol 6.25 MG tablet Commonly known as:  COREG TAKE 1 TABLET (6.25 MG TOTAL) BY MOUTH 2 (TWO) TIMES DAILY.   EPINEPHrine 0.3 mg/0.3 mL Soaj injection Commonly known as:  EPI-PEN Inject 0.3 mLs (0.3 mg total) into the muscle as needed (tongue swelling).   freestyle lancets Check Blood Sugars 1-2 times per day. DX: E11.9   glucose blood test strip Commonly known as:  FREESTYLE LITE Check Blood Sugars 1-2 times per day. DX: E11.9.   KRILL OIL PO Take 1 capsule by mouth daily.   lisinopril 20 MG tablet Commonly known as:  PRINIVIL,ZESTRIL Take 1 tablet (20 mg total) by mouth daily.   metFORMIN 1000 MG tablet Commonly known as:  GLUCOPHAGE Take 1 tablet (1,000 mg total) by mouth 2 (two) times daily.   multivitamin tablet Take 1 tablet by mouth daily. Centrium Silver once daily   vitamin C 500 MG tablet Commonly known as:  ASCORBIC ACID Take 500 mg by mouth daily.        Discharged Condition: good  Physician Statement:   The Patient was personally examined, the discharge assessment and plan has been personally reviewed and I agree with ACNP Babcock's assessment and plan. > 30 minutes of time have been dedicated to discharge assessment, planning and discharge instructions.   Signed: Magdalen Spatz AGACNP-BC 09/04/2016, 11:35 AM

## 2016-09-05 ENCOUNTER — Telehealth: Payer: Self-pay

## 2016-09-05 NOTE — Telephone Encounter (Signed)
LMTCB

## 2016-09-06 NOTE — Telephone Encounter (Signed)
LMTCB

## 2016-09-07 NOTE — Telephone Encounter (Signed)
LMTCB

## 2016-09-11 ENCOUNTER — Encounter: Payer: Self-pay | Admitting: Family Medicine

## 2016-09-11 ENCOUNTER — Ambulatory Visit (INDEPENDENT_AMBULATORY_CARE_PROVIDER_SITE_OTHER): Payer: PPO | Admitting: Family Medicine

## 2016-09-11 VITALS — BP 142/90 | HR 78 | Temp 98.3°F | Wt 164.8 lb

## 2016-09-11 DIAGNOSIS — T783XXD Angioneurotic edema, subsequent encounter: Secondary | ICD-10-CM | POA: Diagnosis not present

## 2016-09-11 DIAGNOSIS — I160 Hypertensive urgency: Secondary | ICD-10-CM

## 2016-09-11 DIAGNOSIS — I10 Essential (primary) hypertension: Secondary | ICD-10-CM

## 2016-09-11 NOTE — Progress Notes (Signed)
Subjective:     Patient ID: Miguel Vazquez, male   DOB: [birth year], 76 y.o.   MRN: [id number]  HPI Patient was scheduled for routine medical follow-up. He was admitted little over week ago secondary to angioedema reaction. He was actually at the hospital visiting his son who had a stroke. He went down to the hospital cafeteria and recalls eating some eggs, strawberries, and sausage as well as cantaloupe. About 20 minutes later he had sudden swelling of tongue and lips. He was on the verge of being intubated. Reversed with epinephrine, Benadryl, and steroids.  Initially thought this was secondary to lisinopril. However, he was given lisinopril after symptoms subsided and did not have any occurrence with that and has had none since then. Has never been tested for food allergies. He does have EpiPen at home now. No known family history of hereditary angioedema  Other medical problems included hypertension, hyperlipidemia, type 2 diabetes. Last A1c 6.8%. He lost some weight with recent hospitalization. Blood sugar stable.  Past Medical History:  Diagnosis Date  . Arthritis   . Brain aneurysm 2018   followed by Dr [name]  . Cataract   . Coronary artery disease   . Diabetes mellitus   . GERD (gastroesophageal reflux disease)   . Hx of abdominal aortic aneurysm 1998  . Hyperlipidemia   . Hypertension   . Personal history of colonic polyps - adenomas 09/09/2013   Past Surgical History:  Procedure Laterality Date  . ABDOMINAL AORTIC ANEURYSM REPAIR  1998  . CARDIAC CATHETERIZATION N/A 03/18/2015   Procedure: Left Heart Cath and Coronary Angiography;  Surgeon: Peter M Martinique, MD;  Location: [facility];  Service: Cardiovascular;  Laterality: N/A;  . cataract surgery Right   . COLONOSCOPY    . CORONARY ARTERY BYPASS GRAFT N/A 03/18/2015   Procedure: CORONARY ARTERY BYPASS GRAFTING (CABG) x  three, using left internal mammary artery and right leg greater saphenous vein harvested endoscopically;   Surgeon: Melrose Nakayama, MD;  Location: [facility];  Service: Open Heart Surgery;  Laterality: N/A;  . INGUINAL HERNIA REPAIR Bilateral   . INTRAOPERATIVE TRANSESOPHAGEAL ECHOCARDIOGRAM N/A 03/18/2015   Procedure: INTRAOPERATIVE TRANSESOPHAGEAL ECHOCARDIOGRAM;  Surgeon: Melrose Nakayama, MD;  Location: [facility];  Service: Open Heart Surgery;  Laterality: N/A;  . open heart surgery  2016  . [place]    reports that he quit smoking about 30 years ago. His smoking use included Cigarettes. He has a 30.00 pack-year smoking history. He has never used smokeless tobacco. He reports that he drinks about 4.2 oz of alcohol per week . He reports that he does not use drugs. family history includes Diabetes in his paternal aunt. Allergies  Allergen Reactions  . Venomil Honey Bee Venom [Honey Bee Venom]      Review of Systems  Constitutional: Negative for fatigue.  Eyes: Negative for visual disturbance.  Respiratory: Negative for cough, chest tightness and shortness of breath.   Cardiovascular: Negative for chest pain, palpitations and leg swelling.  Endocrine: Negative for polydipsia and polyuria.  Neurological: Negative for dizziness, syncope, weakness, light-headedness and headaches.       Objective:   Physical Exam  Constitutional: He is oriented to person, place, and time. He appears well-developed and well-nourished.  HENT:  Right Ear: External ear normal.  Left Ear: External ear normal.  Mouth/Throat: Oropharynx is clear and moist.  Eyes: Pupils are equal, round, and reactive to light.  Neck: Neck supple. No thyromegaly  present.  Cardiovascular: Normal rate and regular rhythm.   Pulmonary/Chest: Effort normal and breath sounds normal. No respiratory distress. He has no wheezes. He has no rales.  Musculoskeletal: He exhibits no edema.  Neurological: He is alert and oriented to person, place, and time.       Assessment:     #1 hypertension. Slightly elevated  this morning but did not take medication this morning  #2 type 2 diabetes which has been recently well controlled   #3 recent angioedema. Etiology unclear-?food allergy.    Plan:     -Recommend referral allergist for further evaluation -Encouraged to keep EpiPen with him at all times -Continue current medications -Routine follow-up in 3 months and repeat A1c then  Eulas Post MD [facility]

## 2016-09-11 NOTE — Patient Instructions (Signed)
We will set up referral to allergist Let's plan on 3 month follow up. Angioedema Angioedema is the sudden swelling of tissue in the body. Angioedema can affect any part of the body, but it most often affects the deeper parts of the skin, causing red, itchy patches (hives) to appear over the affected area. It often begins during the night and is found in the morning. Depending on the cause, angioedema may happen:  Only once.  Several times. It may come back in unpredictable patterns.  Repeatedly for several years. Over time, it may gradually stop coming back.  Angioedema can be life-threatening if it affects the air passages that you breathe through. What are the causes? This condition may be caused by:  Foods, such as milk, eggs, shellfish, wheat, or nuts.  Certain medicines, such as ACE inhibitors, antibiotics, nonsteroidal anti-inflammatory drugs, birth control pills, or dyes used in X-rays.  Insect stings.  Infections.  Angioedema can be inherited, and episodes can be triggered by:  Mild injury.  Dental work.  Surgery.  Stress.  Sudden changes in temperature.  Exercise.  In some cases, the cause of this condition is not known. What are the signs or symptoms? Symptoms of this condition depend on where the swelling happens. Symptoms may include:  Swollen skin.  Red, itchy patches of skin (hives).  Redness in the affected area.  Pain in the affected area.  Swollen lips or tongue.  Wheezing.  Breathing problems.  If your internal organs are involved, symptoms may also include:  Nausea.  Abdominal pain.  Vomiting.  Difficulty swallowing.  Difficulty passing urine.  How is this diagnosed? This condition may be diagnosed based on:  An exam of the affected area.  Your medical history.  Whether anyone in your family has had this condition before.  A review of any medicines you have been taking.  Tests, including: ? Allergy skin tests to see if  the condition was caused by an allergic reaction. ? Blood tests to see if the condition was caused by a gene. ? Tests to check for underlying diseases that could cause the condition.  How is this treated? Treatment for this condition depends on the cause. It may involve any of the following:  If something triggered the condition, making changes to keep it from triggering the condition again.  If the condition affects your breathing, having tubes placed in your airway to keep it open.  Taking medicines to treat symptoms or prevent future episodes. These may include: ? Antihistamines. ? Epinephrine injections. ? Steroids.  If your condition is severe, you may need to be treated at the hospital. Angioedema usually gets better in 24-48 hours. Follow these instructions at home:  Take over-the-counter and prescription medicines only as told by your health care provider.  If you were given medicines for emergency allergy treatment, always carry them with you.  Wear a medical bracelet as told by your health care provider.  If something triggers your condition, avoid the trigger, if possible.  If your condition is inherited and you are thinking about having children, talk to your health care provider. It is important to discuss the risks of passing on the condition to your children. Contact a health care provider if:  You have repeated episodes of angioedema.  Episodes of angioedema start to happen more often than they used to, even after you take steps to prevent them.  You have episodes of angioedema that are more severe than they have been before, even after  you take steps to prevent them.  You are thinking about having children. Get help right away if:  You have severe swelling of your mouth, tongue, or lips.  You have trouble breathing.  You have trouble swallowing.  You faint. This information is not intended to replace advice given to you by your health care provider. Make  sure you discuss any questions you have with your health care provider. Document Released: 05/28/2001 Document Revised: 10/15/2015 Document Reviewed: 09/27/2015 Elsevier Interactive Patient Education  [employer].

## 2016-09-11 NOTE — Telephone Encounter (Signed)
Unable to reach for TCM. Pt seen already.

## 2016-10-18 ENCOUNTER — Encounter: Payer: Self-pay | Admitting: Internal Medicine

## 2016-10-22 ENCOUNTER — Encounter: Payer: Self-pay | Admitting: Nurse Practitioner

## 2016-10-22 ENCOUNTER — Ambulatory Visit (INDEPENDENT_AMBULATORY_CARE_PROVIDER_SITE_OTHER): Payer: PPO | Admitting: Nurse Practitioner

## 2016-10-22 VITALS — BP 140/80 | HR 67 | Wt 166.8 lb

## 2016-10-22 DIAGNOSIS — I672 Cerebral atherosclerosis: Secondary | ICD-10-CM | POA: Diagnosis not present

## 2016-10-22 DIAGNOSIS — I10 Essential (primary) hypertension: Secondary | ICD-10-CM | POA: Diagnosis not present

## 2016-10-22 DIAGNOSIS — E785 Hyperlipidemia, unspecified: Secondary | ICD-10-CM | POA: Diagnosis not present

## 2016-10-22 NOTE — Progress Notes (Signed)
I reviewed above note and agree with the assessment and plan.  Rosalin Hawking, MD PhD Stroke Neurology 10/22/2016 6:19 PM

## 2016-10-22 NOTE — Patient Instructions (Addendum)
Stressed the importance of management of risk factors to prevent further stroke Continue aspirin for secondary intracranial atherosclerosis /stroke prevention Maintain strict control of hypertension with blood pressure goal below 130/90, today's reading 140/80  continue antihypertensive medications Control of diabetes with hemoglobin A1c below 6.5 followed by primary care most recent hemoglobin A1c 6.8 continue diabetic medications Cholesterol with LDL cholesterol less than 70, followed by primary care,   continue statin drugs Lipitor Exercise by walking,   eat healthy diet with whole grains,  fresh fruits and vegetables Discharge from stroke clinic  Stroke Prevention Some medical conditions and behaviors are associated with an increased chance of having a stroke. You may prevent a stroke by making healthy choices and managing medical conditions. How can I reduce my risk of having a stroke?  Stay physically active. Get at least 30 minutes of activity on most or all days.  Do not smoke. It may also be helpful to avoid exposure to secondhand smoke.  Limit alcohol use. Moderate alcohol use is considered to be: ? No more than 2 drinks per day for men. ? No more than 1 drink per day for nonpregnant women.  Eat healthy foods. This involves: ? Eating 5 or more servings of fruits and vegetables a day. ? Making dietary changes that address high blood pressure (hypertension), high cholesterol, diabetes, or obesity.  Manage your cholesterol levels. ? Making food choices that are high in fiber and low in saturated fat, trans fat, and cholesterol may control cholesterol levels. ? Take any prescribed medicines to control cholesterol as directed by your health care provider.  Manage your diabetes. ? Controlling your carbohydrate and sugar intake is recommended to manage diabetes. ? Take any prescribed medicines to control diabetes as directed by your health care provider.  Control your  hypertension. ? Making food choices that are low in salt (sodium), saturated fat, trans fat, and cholesterol is recommended to manage hypertension. ? Ask your health care provider if you need treatment to lower your blood pressure. Take any prescribed medicines to control hypertension as directed by your health care provider. ? If you are 54-16 years of age, have your blood pressure checked every 3-5 years. If you are 88 years of age or older, have your blood pressure checked every year.  Maintain a healthy weight. ? Reducing calorie intake and making food choices that are low in sodium, saturated fat, trans fat, and cholesterol are recommended to manage weight.  Stop drug abuse.  Avoid taking birth control pills. ? Talk to your health care provider about the risks of taking birth control pills if you are over 69 years old, smoke, get migraines, or have ever had a blood clot.  Get evaluated for sleep disorders (sleep apnea). ? Talk to your health care provider about getting a sleep evaluation if you snore a lot or have excessive sleepiness.  Take medicines only as directed by your health care provider. ? For some people, aspirin or blood thinners (anticoagulants) are helpful in reducing the risk of forming abnormal blood clots that can lead to stroke. If you have the irregular heart rhythm of atrial fibrillation, you should be on a blood thinner unless there is a good reason you cannot take them. ? Understand all your medicine instructions.  Make sure that other conditions (such as anemia or atherosclerosis) are addressed. Get help right away if:  You have sudden weakness or numbness of the face, arm, or leg, especially on one side of the body.  Your face or eyelid droops to one side.  You have sudden confusion.  You have trouble speaking (aphasia) or understanding.  You have sudden trouble seeing in one or both eyes.  You have sudden trouble walking.  You have dizziness.  You  have a loss of balance or coordination.  You have a sudden, severe headache with no known cause.  You have new chest pain or an irregular heartbeat. Any of these symptoms may represent a serious problem that is an emergency. Do not wait to see if the symptoms will go away. Get medical help at once. Call your local emergency services (911 in [place] Do not drive yourself to the hospital. This information is not intended to replace advice given to you by your health care provider. Make sure you discuss any questions you have with your health care provider. Document Released: 04/26/2004 Document Revised: 08/25/2015 Document Reviewed: 09/19/2012 Elsevier Interactive Patient Education  2017 [employer].

## 2016-10-22 NOTE — Progress Notes (Signed)
[facility]  PATIENT: Miguel Vazquez DOB: 06/01/1940   REASON FOR VISIT: Follow-up for intracranial atherosclerosis HISTORY FROM: Patient  HISTORY OF PRESENT ILLNESS:UPDATE 07/23/2018CM [name], 76 year old male returns for follow-up . Patient is currently on aspirin 325 daily with no stroke or TIA symptoms. He denies headache. Blood pressure in the office today 140/80. He remains on Lipitor without complaints of myalgias. He does little exercise. Carotid Doppler was negative for significant stenosis. His blood sugars typically run between 100- 150. He returns for reevaluation    HISTORY 04/23/16 Dr. Emilie Rutter is a 76 y.o. male with PMH of CAD, DM, HLD, and HTN who presents as a new patient for intracranial atherosclerosis.   He was in his normal health until 01/06/16 he woke up with severe HA. His whole head hurt, 8/10 and was about to explode. Felt constant pain without throbbing feeling. Denies N/V, photophobia or phonophobia. Denies hx of migraine or HA. His BP in ER was very high at that time. He was admitted and BP controlled. His HA lasted about several hours and resolved. 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 He was then discharged with referral to Dr. Kathyrn Vazquez for aneurysm follow up. Dr. Kathyrn Vazquez saw pt and felt not candidate for intervention and referred to neurology for further evaluation.   He is on ASA and lipitor as home meds. He also takes metformin and glucose controlled well. hsi BP at home 130-140, today in clinic 141/85. Has quit smoking for 30 years, no alcohol or illicit drugs.   Interval history: During the interval time, pt has been doing well. On ASA and lipitor without side effect. BP controlled well at home, today 121/82. Glucose at  Home 70s-130s. He did not check daily but 3 times a week. However, CUS was not done yet. No other complains   REVIEW OF SYSTEMS: Full 14  system review of systems performed and notable only for those listed, all others are neg:  Constitutional: neg  Cardiovascular: neg Ear/Nose/Throat: neg  Skin: neg Eyes: neg Respiratory: neg Gastroitestinal: neg  Hematology/Lymphatic: neg  Endocrine: neg Musculoskeletal:neg Allergy/Immunology: neg Neurological: neg Psychiatric: neg Sleep : neg   ALLERGIES: Allergies  Allergen Reactions  . Other     Powdered eggs- swelling, widespread rash  . Venomil Honey Bee Venom [Honey Bee Venom] Swelling    Localized swelling    HOME MEDICATIONS: Outpatient Medications Prior to Visit  Medication Sig Dispense Refill  . aspirin EC 325 MG EC tablet Take 1 tablet (325 mg total) by mouth daily.    Marland Kitchen atorvastatin (LIPITOR) 80 MG tablet Take 1 tablet (80 mg total) by mouth daily at 6 PM. 90 tablet 3  . carvedilol (COREG) 6.25 MG tablet TAKE 1 TABLET (6.25 MG TOTAL) BY MOUTH 2 (TWO) TIMES DAILY. 180 tablet 1  . glucose blood (FREESTYLE LITE) test strip Check Blood Sugars 1-2 times per day. DX: E11.9. 100 each 5  . KRILL OIL PO Take 1 capsule by mouth daily.     . Lancets (FREESTYLE) lancets Check Blood Sugars 1-2 times per day. DX: E11.9 100 each 5  . lisinopril (PRINIVIL,ZESTRIL) 20 MG tablet Take 1 tablet (20 mg total) by mouth daily. 90 tablet 3  . metFORMIN (GLUCOPHAGE) 1000 MG tablet Take 1 tablet (1,000 mg total) by mouth 2 (two) times daily. 180 tablet 3  . Multiple Vitamin (MULTIVITAMIN) tablet Take 1 tablet by mouth daily. Centrium Silver once daily     .  vitamin C (ASCORBIC ACID) 500 MG tablet Take 500 mg by mouth daily.    Marland Kitchen EPINEPHrine 0.3 mg/0.3 mL IJ SOAJ injection Inject 0.3 mLs (0.3 mg total) into the muscle as needed (tongue swelling). (Patient not taking: Reported on 10/22/2016) 1 Device 2   No facility-administered medications prior to visit.     PAST MEDICAL HISTORY: Past Medical History:  Diagnosis Date  . Arthritis   . Brain aneurysm 2018   followed by Dr Miguel Vazquez  .  Cataract   . Coronary artery disease   . Diabetes mellitus   . GERD (gastroesophageal reflux disease)   . Hx of abdominal aortic aneurysm 1998  . Hyperlipidemia   . Hypertension   . Personal history of colonic polyps - adenomas 09/09/2013    PAST SURGICAL HISTORY: Past Surgical History:  Procedure Laterality Date  . ABDOMINAL AORTIC ANEURYSM REPAIR  1998  . CARDIAC CATHETERIZATION N/A 03/18/2015   Procedure: Left Heart Cath and Coronary Angiography;  Surgeon: Miguel M Martinique, MD;  Location: [facility];  Service: Cardiovascular;  Laterality: N/A;  . cataract surgery Right   . COLONOSCOPY    . CORONARY ARTERY BYPASS GRAFT N/A 03/18/2015   Procedure: CORONARY ARTERY BYPASS GRAFTING (CABG) x  three, using left internal mammary artery and right leg greater saphenous vein harvested endoscopically;  Surgeon: Miguel Nakayama, MD;  Location: [place];  Service: Open Heart Surgery;  Laterality: N/A;  . INGUINAL HERNIA REPAIR Bilateral   . INTRAOPERATIVE TRANSESOPHAGEAL ECHOCARDIOGRAM N/A 03/18/2015   Procedure: INTRAOPERATIVE TRANSESOPHAGEAL ECHOCARDIOGRAM;  Surgeon: Miguel Nakayama, MD;  Location: [facility];  Service: Open Heart Surgery;  Laterality: N/A;  . open heart surgery  2016  . UMBILICAL HERNIA REPAIR  1965    FAMILY HISTORY: Family History  Problem Relation Age of Onset  . Diabetes Paternal Aunt   . Colon cancer Neg Hx     SOCIAL HISTORY: Social History   Social History  . Marital status: Married    Spouse name: Miguel Vazquez  . Number of children: 3  . Years of education: N/A   Occupational History  . Not on file.   Social History Main Topics  . Smoking status: Former Smoker    Packs/day: 1.50    Years: 20.00    Types: Cigarettes    Quit date: 08/24/1986  . Smokeless tobacco: Never Used  . Alcohol use 4.2 oz/week    7 Glasses of wine per week     Comment: occasionally  . Drug use: No  . Sexual activity: Not on file   Other Topics Concern  . Not on file    Social History Narrative  . No narrative on file     PHYSICAL EXAM  Vitals:   10/22/16 1338 10/22/16 1339  BP: (!) 140/80  140/80   Pulse: 65 67  Weight: 166 lb 12.8 oz (75.7 kg)    Body mass index is 25.36 kg/m.  Generalized: Well developed, in no acute distress  Head: normocephalic and atraumatic,. Oropharynx benign  Neck: Supple, no carotid bruits  Cardiac: Regular rate rhythm, no murmur  Musculoskeletal: No deformity   Neurological examination   Mentation: Alert oriented to time, place, history taking. Attention span and concentration appropriate. Recent and remote memory intact.  Follows all commands speech and language fluent.   Cranial nerve II-XII: Pupils were equal round reactive to light extraocular movements were full, visual field were full on confrontational test. Facial sensation and strength were normal. hearing was intact to  finger rubbing bilaterally. Uvula tongue midline. head turning and shoulder shrug were normal and symmetric.Tongue protrusion into cheek strength was normal. Motor: normal bulk and tone, full strength in the BUE, BLE, fine finger movements normal, no pronator drift. No focal weakness Sensory: normal and symmetric to light touch, in the upper and lower extremities  Coordination: finger-nose-finger, heel-to-shin bilaterally, no dysmetria Reflexes: Brachioradialis 2/2, biceps 2/2, triceps 2/2, patellar 2/2, Achilles 2/2, plantar responses were flexor bilaterally. Gait and Station: Rising up from seated position without assistance, normal stance,  moderate stride, good arm swing, smooth turning, able to perform tiptoe, and heel walking without difficulty. Tandem gait is mildly unsteady. No assistive device  DIAGNOSTIC DATA (LABS, IMAGING, TESTING) - I reviewed patient records, labs, notes, testing and imaging myself where available.  Lab Results  Component Value Date   WBC 9.0 09/04/2016   HGB 12.4 (L) 09/04/2016   HCT 36.9 (L) 09/04/2016    MCV 94.6 09/04/2016   PLT 134 (L) 09/04/2016      Component Value Date/Time   NA 140 09/04/2016 0627   K 3.7 09/04/2016 0627   CL 106 09/04/2016 0627   CO2 27 09/04/2016 0627   GLUCOSE 123 (H) 09/04/2016 0627   BUN 15 09/04/2016 0627   CREATININE 0.98 09/04/2016 0627   CREATININE 1.21 (H) 01/13/2016 1606   CALCIUM 8.8 (L) 09/04/2016 0627   PROT 6.7 06/04/2016 1019   ALBUMIN 4.2 06/04/2016 1019   AST 17 06/04/2016 1019   ALT 19 06/04/2016 1019   ALKPHOS 64 06/04/2016 1019   BILITOT 0.8 06/04/2016 1019   GFRNONAA >60 09/04/2016 0627   GFRAA >60 09/04/2016 0627   Lab Results  Component Value Date   CHOL 129 06/04/2016   HDL 38.40 (L) 06/04/2016   LDLCALC 68 06/04/2016   LDLDIRECT 84.1 05/21/2013   TRIG 115.0 06/04/2016   CHOLHDL 3 06/04/2016   Lab Results  Component Value Date   HGBA1C 6.8 (H) 06/04/2016   ASSESSMENT AND PLAN LISSANDRO DILORENZO is a 76 y.o. male with PMH of CAD, DM, HLD, and HTN who presents as a new patient for intracranial atherosclerosis. Developed severe HA on 01/06/16 likely due to significant high BP. 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 He was referred to neurology for further evaluation. A1C 7.4, LDL 75. Has quit smoking for 30 years.  He was on ASA and lipitor as home meds, but increased to full dose ASA and lipitor 80mg . BP and DM meds also adjusted and currently BP and glucose  More stable. CUS normal.    Stressed the importance of management of risk factors to prevent further stroke Continue aspirin for intracranial stenosis  Maintain strict control of hypertension with blood pressure goal below 130/90, today's reading 140/80  continue antihypertensive medications Control of diabetes with hemoglobin A1c below 6.5 followed by primary care most recent hemoglobin A1c 6.8 continue diabetic medications Cholesterol with LDL cholesterol less than 70, followed by primary care,   continue  statin drugs Lipitor Continue follow-up with primary care for stroke risk factor modification Exercise by walking,   eat healthy diet with whole grains,  fresh fruits and vegetables Discharge from stroke clinic I spent 25 minutes in total face to face time with the patient more than 50% of which was spent counseling and coordination of care, reviewing test results reviewing medications and discussing and reviewing the diagnosis of intracranial stenosis and management of risk factors Dennie Bible,  [facility], [facility], [facility] [address], [place], [place] [address] [phone]

## 2016-10-23 ENCOUNTER — Encounter: Payer: Self-pay | Admitting: Allergy and Immunology

## 2016-10-23 ENCOUNTER — Ambulatory Visit (INDEPENDENT_AMBULATORY_CARE_PROVIDER_SITE_OTHER): Payer: PPO | Admitting: Allergy and Immunology

## 2016-10-23 VITALS — BP 158/88 | HR 68 | Temp 98.0°F | Resp 18 | Ht 64.8 in | Wt 166.4 lb

## 2016-10-23 DIAGNOSIS — L500 Allergic urticaria: Secondary | ICD-10-CM | POA: Diagnosis not present

## 2016-10-23 DIAGNOSIS — T783XXD Angioneurotic edema, subsequent encounter: Secondary | ICD-10-CM | POA: Diagnosis not present

## 2016-10-23 DIAGNOSIS — Z79899 Other long term (current) drug therapy: Secondary | ICD-10-CM

## 2016-10-23 MED ORDER — LOSARTAN POTASSIUM 50 MG PO TABS: 50.0000 mg | ORAL_TABLET | Freq: Every day | ORAL | 2 refills | Status: DC

## 2016-10-23 NOTE — Progress Notes (Signed)
Dear Dr. Elease Hashimoto,  Thank you for referring Miguel Vazquez to the [facility] on 10/23/2016.   Below is a summation of this patient's evaluation and recommendations.  Thank you for your referral. I will keep you informed about this patient's response to treatment.   If you have any questions please do not hesitate to contact me.   Sincerely,  Jiles Prows, MD Allergy / [place]   ______________________________________________________________________    NEW PATIENT NOTE  Referring Provider: Eulas Post, MD Primary Provider: Eulas Post, MD Date of office visit: 10/23/2016    Subjective:   Chief Complaint:  Miguel Vazquez (DOB: [birth year]) is a 76 y.o. male who presents to the clinic on 10/23/2016 with a chief complaint of Allergic Reaction (Possible reaction to food. ) .     HPI: [name] presents to this clinic in evaluation of an allergic reaction that started on 09/02/2016. Apparently 20-30 minutes after eating powdered eggs, blueberry muffins, sausage, and fruit at the [employer] he developed a solitary urticarial lesion described as a red swollen itchy area affecting his left arm that quickly progressed into global urticaria across his entire body and some very significant tongue swelling requiring evaluation at the emergency room setting and the administration of 2 injections of epinephrine. After this injections he was better within 10 minutes and within an hour most of his reaction appeared to have resolved. He did develop some delirium from therapy which apparently included systemic steroids but all that was transient as well.  There was no obvious provoking factor giving rise to this issue. He has eaten all these specific food products before and since with no problem. He was on lisinopril which was discontinued for a few days and then restarted  without any incident. He was not on any new over-the-counter medications or health foods or energy boosters or herbs. He was not having any associated systemic or constitutional symptoms suggesting a significant infection. He does not have a history of atopic disease.  Past Medical History:  Diagnosis Date  . Arthritis   . Brain aneurysm 2018   followed by Dr [name]  . Cataract   . Coronary artery disease   . Diabetes mellitus   . GERD (gastroesophageal reflux disease)   . Hx of abdominal aortic aneurysm 1998  . Hyperlipidemia   . Hypertension   . Personal history of colonic polyps - adenomas 09/09/2013    Past Surgical History:  Procedure Laterality Date  . ABDOMINAL AORTIC ANEURYSM REPAIR  1998  . CARDIAC CATHETERIZATION N/A 03/18/2015   Procedure: Left Heart Cath and Coronary Angiography;  Surgeon: Peter M Martinique, MD;  Location: [facility];  Service: Cardiovascular;  Laterality: N/A;  . cataract surgery Right   . COLONOSCOPY    . CORONARY ARTERY BYPASS GRAFT N/A 03/18/2015   Procedure: CORONARY ARTERY BYPASS GRAFTING (CABG) x  three, using left internal mammary artery and right leg greater saphenous vein harvested endoscopically;  Surgeon: Melrose Nakayama, MD;  Location: [facility];  Service: Open Heart Surgery;  Laterality: N/A;  . INGUINAL HERNIA REPAIR Bilateral   . INTRAOPERATIVE TRANSESOPHAGEAL ECHOCARDIOGRAM N/A 03/18/2015   Procedure: INTRAOPERATIVE TRANSESOPHAGEAL ECHOCARDIOGRAM;  Surgeon: Melrose Nakayama, MD;  Location: [facility];  Service: Open Heart Surgery;  Laterality: N/A;  . open heart surgery  2016  . [place]  Allergies as of 10/23/2016      Reactions   Other    Powdered eggs- swelling, widespread rash   Venomil Honey Bee Venom [honey Bee Venom] Swelling   Localized swelling      Medication List      aspirin 325 MG EC tablet Take 1 tablet (325 mg total) by mouth daily.   atorvastatin 80 MG tablet Commonly known as:   LIPITOR Take 1 tablet (80 mg total) by mouth daily at 6 PM.   carvedilol 6.25 MG tablet Commonly known as:  COREG TAKE 1 TABLET (6.25 MG TOTAL) BY MOUTH 2 (TWO) TIMES DAILY.   EPINEPHrine 0.3 mg/0.3 mL Soaj injection Commonly known as:  EPI-PEN Inject 0.3 mLs (0.3 mg total) into the muscle as needed (tongue swelling).   freestyle lancets Check Blood Sugars 1-2 times per day. DX: E11.9   glucose blood test strip Commonly known as:  FREESTYLE LITE Check Blood Sugars 1-2 times per day. DX: E11.9.   KRILL OIL PO Take 1 capsule by mouth daily.   lisinopril 20 MG tablet Commonly known as:  PRINIVIL,ZESTRIL Take 1 tablet (20 mg total) by mouth daily.   metFORMIN 1000 MG tablet Commonly known as:  GLUCOPHAGE Take 1 tablet (1,000 mg total) by mouth 2 (two) times daily.   multivitamin tablet Take 1 tablet by mouth daily. Centrium Silver once daily   vitamin C 500 MG tablet Commonly known as:  ASCORBIC ACID Take 500 mg by mouth daily.       Review of systems negative except as noted in HPI / PMHx or noted below:  Review of Systems  Constitutional: Negative.   HENT: Negative.   Eyes: Negative.   Respiratory: Negative.   Cardiovascular: Negative.   Gastrointestinal: Negative.   Genitourinary: Negative.   Musculoskeletal: Negative.   Skin: Negative.   Neurological: Negative.   Endo/Heme/Allergies: Negative.   Psychiatric/Behavioral: Negative.     Family History  Problem Relation Age of Onset  . Bone cancer Mother   . Diabetes Paternal Aunt   . Stroke Son   . Down syndrome Son   . Colon cancer Neg Hx     Social History   Social History  . Marital status: Married    Spouse name: [name]  . Number of children: 3  . Years of education: N/A   Occupational History  . Not on file.   Social History Main Topics  . Smoking status: Former Smoker    Packs/day: 1.50    Years: 20.00    Types: Cigarettes    Quit date: 08/24/1986  . Smokeless tobacco: Never Used  .  Alcohol use 4.2 oz/week    7 Glasses of wine per week     Comment: occasionally  . Drug use: No  . Sexual activity: Not on file   Other Topics Concern  . Not on file   Social History Narrative  . No narrative on file    Environmental and Social history  Lives in a house with a dry environment, no animals located inside the household, carpeting in the bedroom, no plastic on the bed, no plastic on the pillow, and no smokers located inside the household.   Objective:   Vitals:   10/23/16 1417  BP: (!) 158/88  Pulse: 68  Resp: 18  Temp: 98 F (36.7 C)   Height: 5' 4.8" (164.6 cm) Weight: 166 lb 6.4 oz (75.5 kg)  Physical Exam  Constitutional: He is well-developed, well-nourished, and in no distress.  HENT:  Head: Normocephalic. Head is without right periorbital erythema and without left periorbital erythema.  Right Ear: Tympanic membrane, external ear and ear canal normal.  Left Ear: Tympanic membrane, external ear and ear canal normal.  Nose: Nose normal. No mucosal edema or rhinorrhea.  Mouth/Throat: Oropharynx is clear and moist and mucous membranes are normal. No oropharyngeal exudate.  Eyes: Pupils are equal, round, and reactive to light. Conjunctivae and lids are normal.  Neck: Trachea normal. No tracheal deviation present. No thyromegaly present.  Cardiovascular: Normal rate, regular rhythm, S1 normal and S2 normal.   Murmur (systolic) heard. Pulmonary/Chest: Effort normal. No stridor. No tachypnea. No respiratory distress. He has no wheezes. He has no rales. He exhibits no tenderness.  Abdominal: Soft. He exhibits no distension and no mass. There is no hepatosplenomegaly. There is no tenderness. There is no rebound and no guarding.  Musculoskeletal: He exhibits no edema or tenderness.  Lymphadenopathy:       Head (right side): No tonsillar adenopathy present.       Head (left side): No tonsillar adenopathy present.    He has no cervical adenopathy.    He has no  axillary adenopathy.  Neurological: He is alert. Gait normal.  Skin: No rash noted. He is not diaphoretic. No erythema. No pallor. Nails show no clubbing.  Psychiatric: Mood and affect normal.    Diagnostics: Allergy skin tests were performed. He demonstrated very significant hypersensitivity against [place] nut and also demonstrated hypersensitivity against hazelnut and various shellfish.  Assessment and Plan:    1. Allergic urticaria   2. Angioedema, subsequent encounter   3. On angiotensin-converting enzyme (ACE) inhibitors     1. Allergen avoidance measures  2. EpiPen, Benadryl, M.D./ER evaluation for allergic reaction  3. Change lisinopril to losartan 50 mg tablet 1 time per day and check blood pressure with primary care doctor  4. Blood - Nut panel, Shellfish panel, alpha gal panel  5. Contact clinic if additional reaction in the future  It appears as though Miguel Vazquez may of had a significant hypersensitivity reaction directed against the specific food product which I suspect is a nut. He can't really remember the details of what he ate that morning or more specifically what his wife ate that morning but he did share breakfast with his wife and he may have eaten nuts that was contained within a salad. For now he will remain away from all nuts and shellfish and we will further investigate this atopic state with the blood tests noted above. 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 I have changed him to losartan and asked him to follow-up with his primary care doctor concerning further management of his blood pressure. I will contact him with the results is that his blood tests once they are available for review.  Jiles Prows, MD [facility]

## 2016-10-23 NOTE — Patient Instructions (Addendum)
  1. Allergen avoidance measures  2. EpiPen, Benadryl, M.D./ER evaluation for allergic reaction  3. Change lisinopril to losartan 50 mg tablet 1 time per day and check blood pressure with primary care doctor  4. Blood - Nut panel, Shellfish panel, alpha gal panel  5. Contact clinic if additional reaction in the future

## 2016-11-30 ENCOUNTER — Other Ambulatory Visit: Payer: Self-pay | Admitting: Cardiology

## 2016-12-12 ENCOUNTER — Encounter: Payer: Self-pay | Admitting: Family Medicine

## 2016-12-12 ENCOUNTER — Ambulatory Visit (INDEPENDENT_AMBULATORY_CARE_PROVIDER_SITE_OTHER): Payer: PPO | Admitting: Family Medicine

## 2016-12-12 VITALS — BP 138/78 | HR 71 | Temp 98.1°F | Wt 164.5 lb

## 2016-12-12 DIAGNOSIS — Z23 Encounter for immunization: Secondary | ICD-10-CM | POA: Diagnosis not present

## 2016-12-12 DIAGNOSIS — E1121 Type 2 diabetes mellitus with diabetic nephropathy: Secondary | ICD-10-CM | POA: Diagnosis not present

## 2016-12-12 DIAGNOSIS — I25810 Atherosclerosis of coronary artery bypass graft(s) without angina pectoris: Secondary | ICD-10-CM | POA: Diagnosis not present

## 2016-12-12 DIAGNOSIS — E785 Hyperlipidemia, unspecified: Secondary | ICD-10-CM

## 2016-12-12 DIAGNOSIS — I10 Essential (primary) hypertension: Secondary | ICD-10-CM | POA: Diagnosis not present

## 2016-12-12 DIAGNOSIS — T783XXD Angioneurotic edema, subsequent encounter: Secondary | ICD-10-CM | POA: Diagnosis not present

## 2016-12-12 LAB — POCT GLYCOSYLATED HEMOGLOBIN (HGB A1C): Hemoglobin A1C: 6.5

## 2016-12-12 NOTE — Patient Instructions (Signed)
Monitor blood pressure. Our goal is < 130/80 Let's plan on 6 months follow up Your A1C today was 6.5%

## 2016-12-12 NOTE — Progress Notes (Signed)
Subjective:     Patient ID: Miguel Vazquez, male   DOB: [birth year], 76 y.o.   MRN: [id number]  HPI Patient seen for medical follow-up. Angioedema reaction which required hospitalization recently. Refer to prior note. 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 Looks like he had some further testing with blood work and he has not seen those results yet.  Type 2 diabetes. Does not monitor blood sugars regularly. Remains on metformin. No polyuria or polydipsia. He's done a great job keeping his weight down.  Hypertension treated with losartan. He was switched recently from ACE inhibitor to angiotensin receptor blocker after angioedema reaction though not clear this was ACE inhibitor related. His blood pressures at home has been mostly around the 151V systolic and 80 diastolic. He remains on atorvastatin for hyperlipidemia. No recent chest pains. No headaches. No dizziness. Needs flu vaccine.  Past Medical History:  Diagnosis Date  . Arthritis   . Brain aneurysm 2018   followed by Dr [name]  . Cataract   . Coronary artery disease   . Diabetes mellitus   . GERD (gastroesophageal reflux disease)   . Hx of abdominal aortic aneurysm 1998  . Hyperlipidemia   . Hypertension   . Personal history of colonic polyps - adenomas 09/09/2013   Past Surgical History:  Procedure Laterality Date  . ABDOMINAL AORTIC ANEURYSM REPAIR  1998  . CARDIAC CATHETERIZATION N/A 03/18/2015   Procedure: Left Heart Cath and Coronary Angiography;  Surgeon: Peter M Martinique, MD;  Location: [facility];  Service: Cardiovascular;  Laterality: N/A;  . cataract surgery Right   . COLONOSCOPY    . CORONARY ARTERY BYPASS GRAFT N/A 03/18/2015   Procedure: CORONARY ARTERY BYPASS GRAFTING (CABG) x  three, using left internal mammary artery and right leg greater saphenous vein harvested endoscopically;  Surgeon: Melrose Nakayama, MD;  Location: [facility];  Service: Open Heart Surgery;   Laterality: N/A;  . INGUINAL HERNIA REPAIR Bilateral   . INTRAOPERATIVE TRANSESOPHAGEAL ECHOCARDIOGRAM N/A 03/18/2015   Procedure: INTRAOPERATIVE TRANSESOPHAGEAL ECHOCARDIOGRAM;  Surgeon: Melrose Nakayama, MD;  Location: [facility];  Service: Open Heart Surgery;  Laterality: N/A;  . open heart surgery  2016  . [place]    reports that he quit smoking about 30 years ago. His smoking use included Cigarettes. He has a 30.00 pack-year smoking history. He has never used smokeless tobacco. He reports that he drinks about 4.2 oz of alcohol per week . He reports that he does not use drugs. family history includes Bone cancer in his mother; Diabetes in his paternal aunt; Down syndrome in his son; Stroke in his son. Allergies  Allergen Reactions  . Other     Powdered eggs- swelling, widespread rash  . Venomil Honey Bee Venom [Honey Bee Venom] Swelling    Localized swelling     Review of Systems  Constitutional: Negative for fatigue.  Eyes: Negative for visual disturbance.  Respiratory: Negative for cough, chest tightness and shortness of breath.   Cardiovascular: Negative for chest pain, palpitations and leg swelling.  Gastrointestinal: Negative for abdominal pain.  Endocrine: Negative for polydipsia and polyuria.  Genitourinary: Negative for dysuria.  Neurological: Negative for dizziness, syncope, weakness, light-headedness and headaches.       Objective:   Physical Exam  Constitutional: He is oriented to person, place, and time. He appears well-developed and well-nourished. No distress.  HENT:  Head: Normocephalic and atraumatic.  Right  Ear: External ear normal.  Left Ear: External ear normal.  Mouth/Throat: Oropharynx is clear and moist.  Eyes: Pupils are equal, round, and reactive to light. Conjunctivae and EOM are normal.  Neck: Normal range of motion. Neck supple. No thyromegaly present.  Cardiovascular: Normal rate, regular rhythm and normal heart sounds.    No murmur heard. Pulmonary/Chest: No respiratory distress. He has no wheezes. He has no rales.  Abdominal: Soft. Bowel sounds are normal. He exhibits no distension and no mass. There is no tenderness. There is no rebound and no guarding.  Musculoskeletal: He exhibits no edema.  Lymphadenopathy:    He has no cervical adenopathy.  Neurological: He is alert and oriented to person, place, and time. He displays normal reflexes. No cranial nerve deficit.  Skin: No rash noted.  Feet reveal no skin lesions. Good distal foot pulses. Good capillary refill. No calluses. Normal sensation with monofilament testing   Psychiatric: He has a normal mood and affect.       Assessment:     #1 type 2 diabetes. Well controlled with A1c today 6.5%  #2 hypertension. Initial reading here 140/90 and improved slightly on repeat. Well controlled by home readings  #3 hyperlipidemia  #4 recent angioedema reaction probably related to food allergy. Patient now avoiding tree nuts    Plan:     -Flu vaccine given -Continue current medications -Routine follow-up in 6 months and sooner as needed  Eulas Post MD [facility]

## 2016-12-20 ENCOUNTER — Encounter: Payer: Self-pay | Admitting: Family Medicine

## 2017-01-12 ENCOUNTER — Other Ambulatory Visit: Payer: Self-pay | Admitting: Neurology

## 2017-01-12 DIAGNOSIS — I10 Essential (primary) hypertension: Secondary | ICD-10-CM

## 2017-01-12 DIAGNOSIS — E785 Hyperlipidemia, unspecified: Secondary | ICD-10-CM

## 2017-01-14 ENCOUNTER — Other Ambulatory Visit: Payer: Self-pay

## 2017-01-14 DIAGNOSIS — E785 Hyperlipidemia, unspecified: Secondary | ICD-10-CM

## 2017-01-14 MED ORDER — ATORVASTATIN CALCIUM 80 MG PO TABS: 80.0000 mg | ORAL_TABLET | Freq: Every day | ORAL | 0 refills | Status: DC

## 2017-01-14 NOTE — Telephone Encounter (Signed)
Refill will be done till 04/01/2017 for liptior. Pt is no on lisinopril. Pt was release back to PCP. Pt will have to notified PCP to continue refills on lipitor.

## 2017-01-21 ENCOUNTER — Other Ambulatory Visit: Payer: Self-pay | Admitting: Allergy and Immunology

## 2017-02-07 ENCOUNTER — Other Ambulatory Visit: Payer: Self-pay

## 2017-02-07 ENCOUNTER — Encounter (HOSPITAL_BASED_OUTPATIENT_CLINIC_OR_DEPARTMENT_OTHER): Payer: Self-pay

## 2017-02-07 ENCOUNTER — Emergency Department (HOSPITAL_BASED_OUTPATIENT_CLINIC_OR_DEPARTMENT_OTHER)
Admission: EM | Admit: 2017-02-07 | Discharge: 2017-02-07 | Disposition: A | Discharge status: Home / Self Care | Payer: PPO | Attending: Emergency Medicine | Admitting: Emergency Medicine

## 2017-02-07 DIAGNOSIS — Z5321 Procedure and treatment not carried out due to patient leaving prior to being seen by health care provider: Secondary | ICD-10-CM | POA: Insufficient documentation

## 2017-02-07 DIAGNOSIS — R4182 Altered mental status, unspecified: Secondary | ICD-10-CM | POA: Diagnosis present

## 2017-02-07 LAB — CBG MONITORING, ED: GLUCOSE-CAPILLARY: 227 mg/dL — AB (ref 65–99)

## 2017-02-07 NOTE — ED Notes (Signed)
Per daughter going to leave  Will follow up with his md in am

## 2017-02-07 NOTE — ED Triage Notes (Signed)
Per daughter pt with confusion, sleeping a lot, stumbling gait x 1 week-slurred speech x 2 days-pt NAD-steady gait into triage-alert to name, DOB, place-reoriented to date

## 2017-02-08 ENCOUNTER — Ambulatory Visit: Payer: PPO | Admitting: Adult Health

## 2017-02-08 ENCOUNTER — Encounter: Payer: Self-pay | Admitting: Adult Health

## 2017-02-08 VITALS — BP 162/74 | Temp 97.7°F | Ht 68.0 in | Wt 163.0 lb

## 2017-02-08 DIAGNOSIS — R5383 Other fatigue: Secondary | ICD-10-CM

## 2017-02-08 DIAGNOSIS — R410 Disorientation, unspecified: Secondary | ICD-10-CM | POA: Diagnosis not present

## 2017-02-08 DIAGNOSIS — R4189 Other symptoms and signs involving cognitive functions and awareness: Secondary | ICD-10-CM | POA: Diagnosis not present

## 2017-02-08 LAB — CBC WITH DIFFERENTIAL/PLATELET
BASOS PCT: 0.8 % (ref 0.0–3.0)
Basophils Absolute: 0.1 10*3/uL (ref 0.0–0.1)
Eosinophils Absolute: 0.4 10*3/uL (ref 0.0–0.7)
Eosinophils Relative: 4.8 % (ref 0.0–5.0)
HEMATOCRIT: 42.6 % (ref 39.0–52.0)
Hemoglobin: 14.5 g/dL (ref 13.0–17.0)
LYMPHS ABS: 1.1 10*3/uL (ref 0.7–4.0)
LYMPHS PCT: 13.1 % (ref 12.0–46.0)
MCHC: 33.9 g/dL (ref 30.0–36.0)
MCV: 98.2 fl (ref 78.0–100.0)
MONOS PCT: 8.1 % (ref 3.0–12.0)
Monocytes Absolute: 0.7 10*3/uL (ref 0.1–1.0)
NEUTROS ABS: 6.1 10*3/uL (ref 1.4–7.7)
NEUTROS PCT: 73.2 % (ref 43.0–77.0)
PLATELETS: 155 10*3/uL (ref 150.0–400.0)
RBC: 4.34 Mil/uL (ref 4.22–5.81)
RDW: 14.1 % (ref 11.5–15.5)
WBC: 8.3 10*3/uL (ref 4.0–10.5)

## 2017-02-08 LAB — BASIC METABOLIC PANEL
BUN: 20 mg/dL (ref 6–23)
CALCIUM: 9.8 mg/dL (ref 8.4–10.5)
CHLORIDE: 102 meq/L (ref 96–112)
CO2: 30 meq/L (ref 19–32)
Creatinine, Ser: 0.99 mg/dL (ref 0.40–1.50)
GFR: 78.32 mL/min (ref >60.00)
GLUCOSE: 103 mg/dL — AB (ref 70–99)
Potassium: 4.1 mEq/L (ref 3.5–5.1)
SODIUM: 141 meq/L (ref 135–145)

## 2017-02-08 LAB — HEPATIC FUNCTION PANEL
ALT: 17 U/L (ref 0–53)
AST: 17 U/L (ref 0–37)
Albumin: 4.3 g/dL (ref 3.5–5.2)
Alkaline Phosphatase: 55 U/L (ref 39–117)
BILIRUBIN DIRECT: 0.2 mg/dL (ref 0.0–0.3)
BILIRUBIN TOTAL: 0.9 mg/dL (ref 0.2–1.2)
Total Protein: 6.8 g/dL (ref 6.0–8.3)

## 2017-02-08 LAB — TSH: TSH: 7.75 u[IU]/mL — AB (ref 0.35–4.50)

## 2017-02-08 LAB — POC URINALSYSI DIPSTICK (AUTOMATED)
Bilirubin, UA: NEGATIVE
GLUCOSE UA: NEGATIVE
KETONES UA: NEGATIVE
LEUKOCYTES UA: NEGATIVE
Nitrite, UA: NEGATIVE
UROBILINOGEN UA: 0.2 U/dL
pH, UA: 6 (ref 5.0–8.0)

## 2017-02-08 NOTE — Patient Instructions (Signed)
It was great meeting you today   I am going to order blood work and some imaging of your head   I will follow up with you when your blood work comes back and someone will call you to schedule your imaging   If you have symptoms over the weekend then please go to the ER

## 2017-02-08 NOTE — Progress Notes (Signed)
Subjective:    Patient ID: Miguel Vazquez, male    DOB: 10-06-1940, 76 y.o.   MRN: [id number]  HPI  76 year old male who  has a past medical history of Arthritis, Brain aneurysm (2018), Cataract, Coronary artery disease, Diabetes mellitus, GERD (gastroesophageal reflux disease), abdominal aortic aneurysm (1998), Hyperlipidemia, Hypertension, and Personal history of colonic polyps - adenomas (09/09/2013). He is a patient of Dr. Elease Hashimoto, who I am seeing today. He presents with his wife and daughter to this visit, who help provide history .   Family reports that over the last week they have noticed small transient changes in Miguel Vazquez's cognition. His symptoms started with mild confusion about every day items and slight stumbling when he would walk. Three days ago when he woke up from sleeping, his wife reports that he was more confused and had slurred speech. She called her son, who came over and evaluated Miguel Vazquez, by the time the son had arrived ( approx .10 min), his symptoms have resolved   His wife reports that , two nights ago as he was driving to dinner, to a restaurant he had been multiple times in the past " he took the long way. What would normally be a straight shot to the restaurant, he took the round about way across town. It was like he had no idea where he was going."   Additionally, family reports that yesterday he went to [employer] to get some work done on his truck, he left after waiting for 30 minutes. When he left, he also took someone elses car keys with him. When he returned home, Miguel Vazquez called and said that that video footage showed that he had taken the other persons car keys, these car keys were later found in Miguel Vazquez's work shed.   Furthermore, family reports that Miguel Vazquez is sleeping more throughout the day and seems more fatigued. Family feels as though Daesean does not remember these episodes and he becomes frustrated when they talk to him about it ( this was apparent during the  exam today)   Family denies any symptoms of headache, blurred vision, dizziness, numbness, facial asymmetry.   Review of Systems  Constitutional: Positive for activity change and fatigue. Negative for chills, diaphoresis and fever.  Respiratory: Negative.   Cardiovascular: Negative.   Gastrointestinal: Negative.   Neurological: Positive for speech difficulty. Negative for dizziness, tremors, syncope, facial asymmetry, light-headedness, numbness and headaches.  Psychiatric/Behavioral: Positive for agitation, confusion and sleep disturbance.   Past Medical History:  Diagnosis Date  . Arthritis   . Brain aneurysm 2018   followed by Dr [name]  . Cataract   . Coronary artery disease   . Diabetes mellitus   . GERD (gastroesophageal reflux disease)   . Hx of abdominal aortic aneurysm 1998  . Hyperlipidemia   . Hypertension   . Personal history of colonic polyps - adenomas 09/09/2013    Social History   Socioeconomic History  . Marital status: Married    Spouse name: Miguel Vazquez  . Number of children: 3  . Years of education: Not on file  . Highest education level: Not on file  Social Needs  . Financial resource strain: Not on file  . Food insecurity - worry: Not on file  . Food insecurity - inability: Not on file  . Transportation needs - medical: Not on file  . Transportation needs - non-medical: Not on file  Occupational History  . Not on file  Tobacco Use  . Smoking  status: Former Smoker    Packs/day: 1.50    Years: 20.00    Pack years: 30.00    Types: Cigarettes    Last attempt to quit: 08/24/1986    Years since quitting: 30.4  . Smokeless tobacco: Never Used  Substance and Sexual Activity  . Alcohol use: Yes    Comment: occ  . Drug use: No  . Sexual activity: Not on file  Other Topics Concern  . Not on file  Social History Narrative  . Not on file    Past Surgical History:  Procedure Laterality Date  . ABDOMINAL AORTIC ANEURYSM REPAIR  1998  . cataract surgery  Right   . COLONOSCOPY    . INGUINAL HERNIA REPAIR Bilateral   . open heart surgery  2016  . UMBILICAL HERNIA REPAIR  1965    Family History  Problem Relation Age of Onset  . Bone cancer Mother   . Diabetes Paternal Aunt   . Stroke Son   . Down syndrome Son   . Colon cancer Neg Hx     Allergies  Allergen Reactions  . Other     Powdered eggs- swelling, widespread rash  . Prednisone     "psychosis"  . Venomil Honey Bee Venom [Honey Bee Venom] Swelling    Localized swelling    Current Outpatient Medications on File Prior to Visit  Medication Sig Dispense Refill  . aspirin EC 325 MG EC tablet Take 1 tablet (325 mg total) by mouth daily.    Marland Kitchen atorvastatin (LIPITOR) 80 MG tablet Take 1 tablet (80 mg total) by mouth daily at 6 PM. 90 tablet 0  . carvedilol (COREG) 6.25 MG tablet TAKE 1 TABLET (6.25 MG TOTAL) BY MOUTH 2 (TWO) TIMES DAILY. 180 tablet 1  . EPINEPHrine 0.3 mg/0.3 mL IJ SOAJ injection Inject 0.3 mLs (0.3 mg total) into the muscle as needed (tongue swelling). 1 Device 2  . glucose blood (FREESTYLE LITE) test strip Check Blood Sugars 1-2 times per day. DX: E11.9. 100 each 5  . KRILL OIL PO Take 1 capsule by mouth daily.     . Lancets (FREESTYLE) lancets Check Blood Sugars 1-2 times per day. DX: E11.9 100 each 5  . losartan (COZAAR) 50 MG tablet TAKE 1 TABLET BY MOUTH EVERY DAY 30 tablet 0  . metFORMIN (GLUCOPHAGE) 1000 MG tablet Take 1 tablet (1,000 mg total) by mouth 2 (two) times daily. 180 tablet 3  . Multiple Vitamin (MULTIVITAMIN) tablet Take 1 tablet by mouth daily. Centrium Silver once daily     . vitamin C (ASCORBIC ACID) 500 MG tablet Take 500 mg by mouth daily.     No current facility-administered medications on file prior to visit.     BP (!) 162/74 (BP Location: Left Arm)   Temp 97.7 F (36.5 C) (Oral)   Ht 5\' 8"  (1.727 m)   Wt 163 lb (73.9 kg)   BMI 24.78 kg/m       Objective:   Physical Exam  Constitutional: He is oriented to person, place, and  time. He appears well-developed and well-nourished. No distress.  HENT:  Head: Normocephalic and atraumatic.  Right Ear: External ear normal.  Left Ear: External ear normal.  Nose: Nose normal.  Mouth/Throat: Oropharynx is clear and moist. No oropharyngeal exudate.  Eyes: Conjunctivae and EOM are normal. Pupils are equal, round, and reactive to light. Right eye exhibits no discharge. Left eye exhibits no discharge. No scleral icterus.  Neck: Normal range of motion. Neck  supple. No thyromegaly present.  Cardiovascular: Normal rate, regular rhythm, normal heart sounds and intact distal pulses. Exam reveals no gallop and no friction rub.  No murmur heard. Pulmonary/Chest: Effort normal and breath sounds normal. No respiratory distress. He has no wheezes. He has no rales. He exhibits no tenderness.  Lymphadenopathy:    He has no cervical adenopathy.  Neurological: He is alert and oriented to person, place, and time. He has normal strength. No cranial nerve deficit or sensory deficit. He exhibits normal muscle tone. Coordination and gait normal. GCS eye subscore is 4. GCS verbal subscore is 5. GCS motor subscore is 6.  Finger to nose - negative for abnormality  No pronator drift   Skin: Skin is warm and dry. No rash noted. He is not diaphoretic. No erythema. No pallor.  Psychiatric: He has a normal mood and affect. His behavior is normal. Judgment and thought content normal.  Nursing note and vitals reviewed.     Assessment & Plan:  1. Cognitive impairment - Alert and oriented in exam today. Argumentative with family members about his condition  - Possible TIA's or possible beginning of dementia. Will get labs and CT head.  - Consider referral to neurology and MRI in the future  - POCT Urinalysis Dipstick (Automated) - Basic metabolic panel - CBC with Differential/Platelet - Hepatic function panel - TSH - CT Head Wo Contrast; Future  2. Fatigue, unspecified type  - POCT Urinalysis  Dipstick (Automated)

## 2017-02-14 ENCOUNTER — Ambulatory Visit (INDEPENDENT_AMBULATORY_CARE_PROVIDER_SITE_OTHER)
Admission: RE | Admit: 2017-02-14 | Discharge: 2017-02-14 | Disposition: A | Discharge status: Home / Self Care | Payer: PPO | Source: Ambulatory Visit | Attending: Adult Health | Admitting: Adult Health

## 2017-02-14 DIAGNOSIS — R4189 Other symptoms and signs involving cognitive functions and awareness: Secondary | ICD-10-CM

## 2017-02-14 DIAGNOSIS — R413 Other amnesia: Secondary | ICD-10-CM | POA: Diagnosis not present

## 2017-02-14 IMAGING — CT CT HEAD W/O CM
3 series · 16 of 47 positions shown, 19 images · non-contrast
Comparison: [DATE]

CLINICAL DATA: Short-term forgetfulness.

EXAM:
CT HEAD WITHOUT CONTRAST
TECHNIQUE: Contiguous axial images were obtained from the base of the skull
through the vertex without intravenous contrast.

[Series 2: head 5.0 h37s · axial · 0.44mm/px · z∈[+136,+261]mm · 10 of 30 slices shown, 13 images]
[im 3/30  brain]
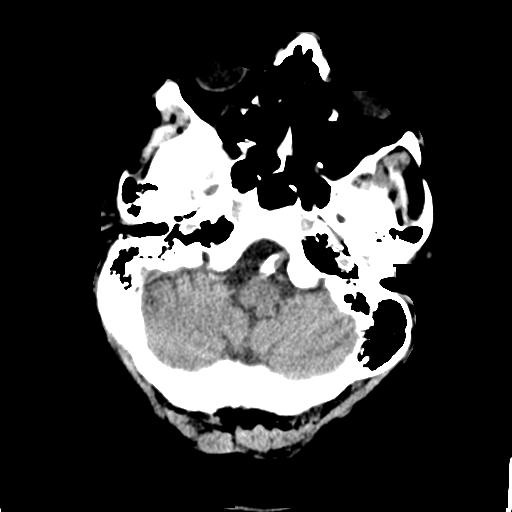
[im 3/30  bone]
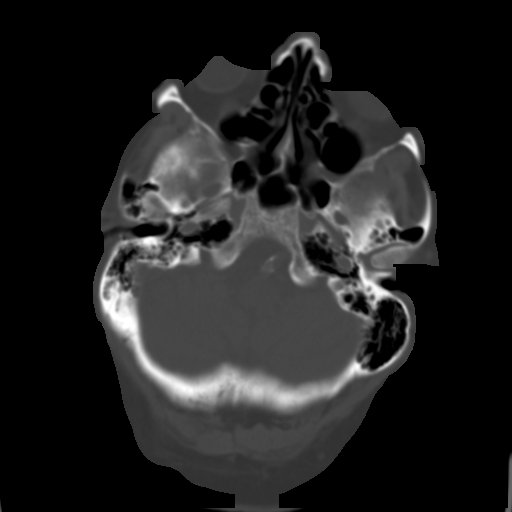
[im 6/30  brain]
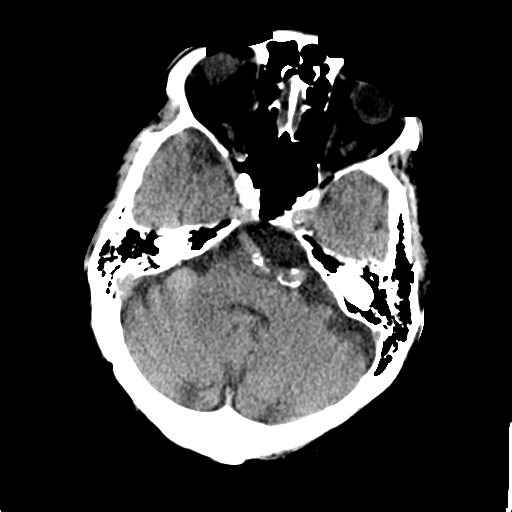
[im 9/30  brain]
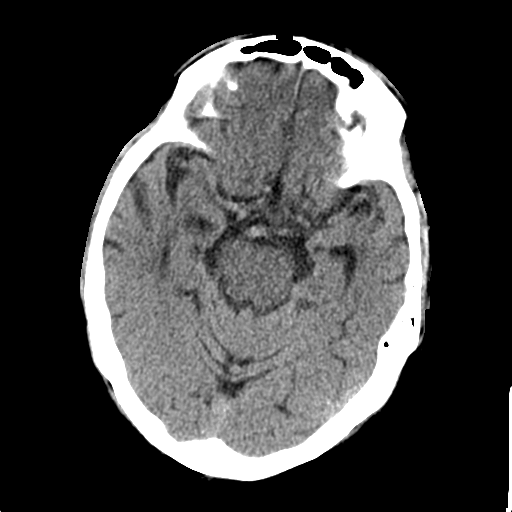
[im 11/30  brain]
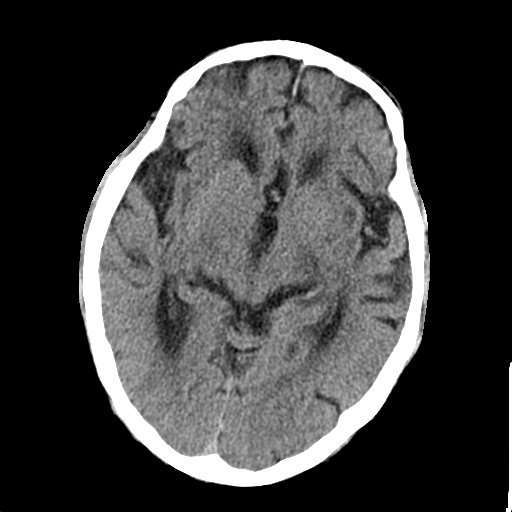
[im 14/30  brain]
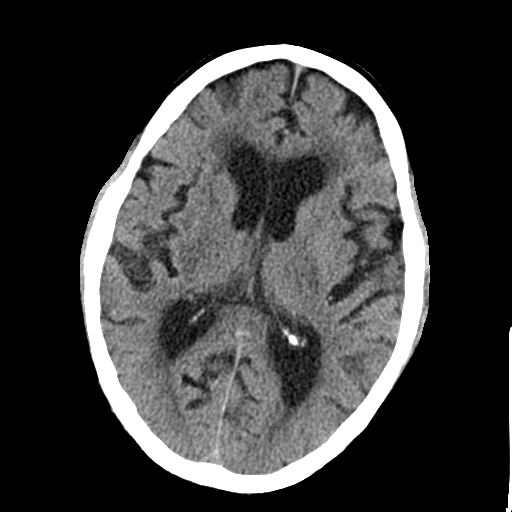
[im 14/30  bone]
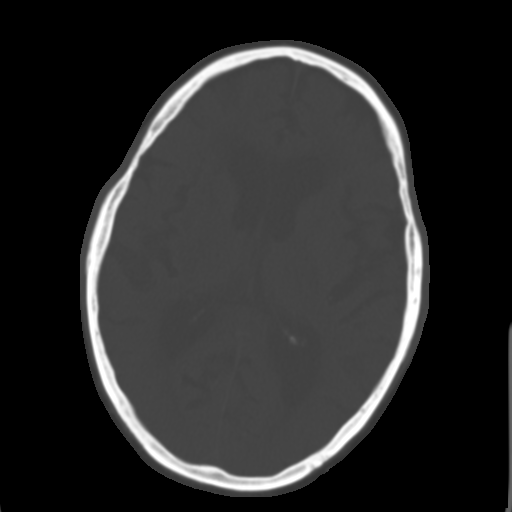
[im 17/30  brain]
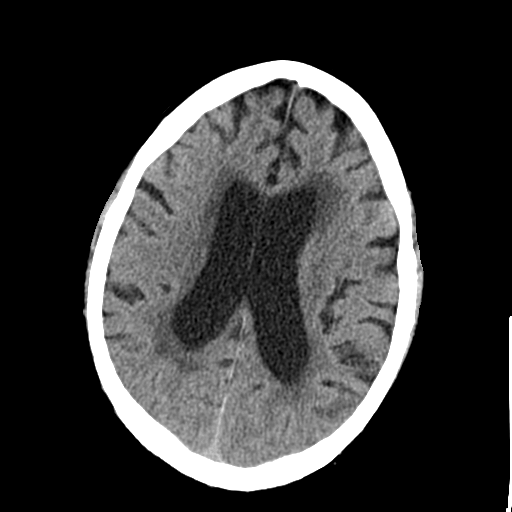
[im 20/30  brain]
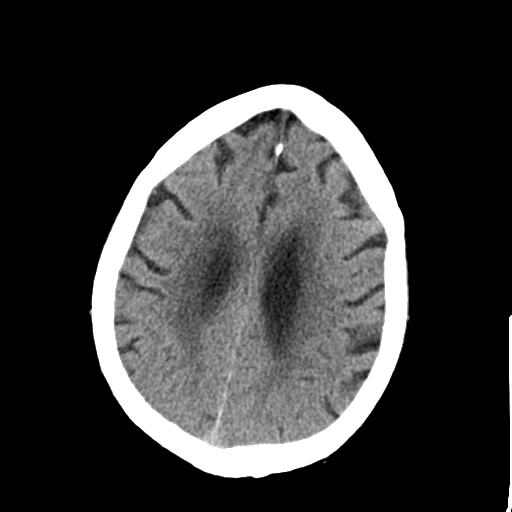
[im 23/30  brain]
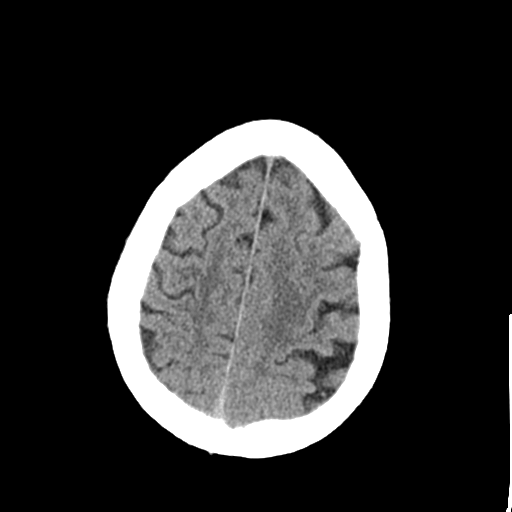
[im 25/30  brain]
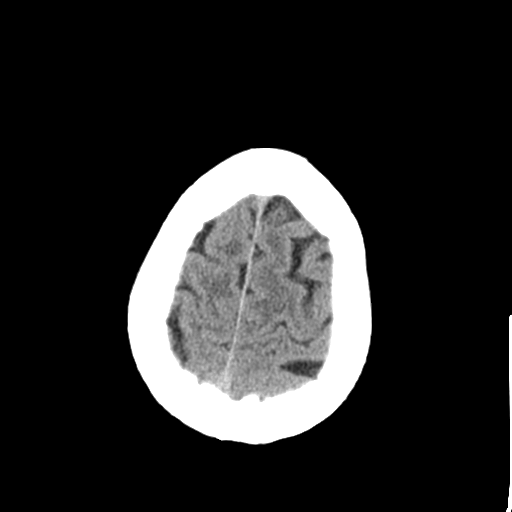
[im 25/30  bone]
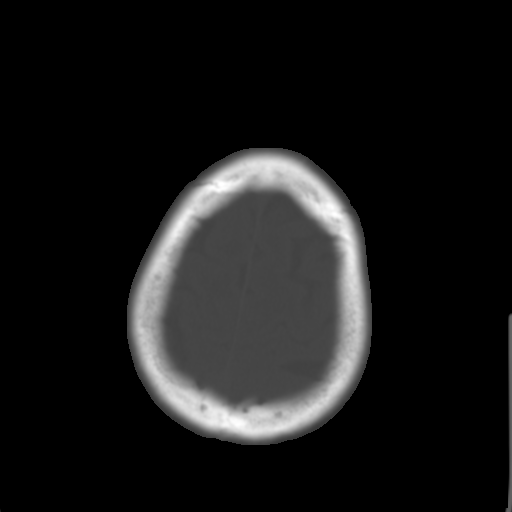
[im 28/30  brain]
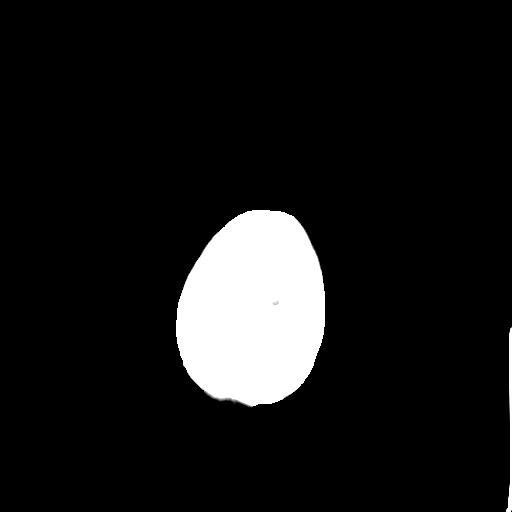

[Series 4: head 3.0 mpr cor · coronal · 0.28mm/px · 3 of 68 slices shown]
[im 23/68  brain]
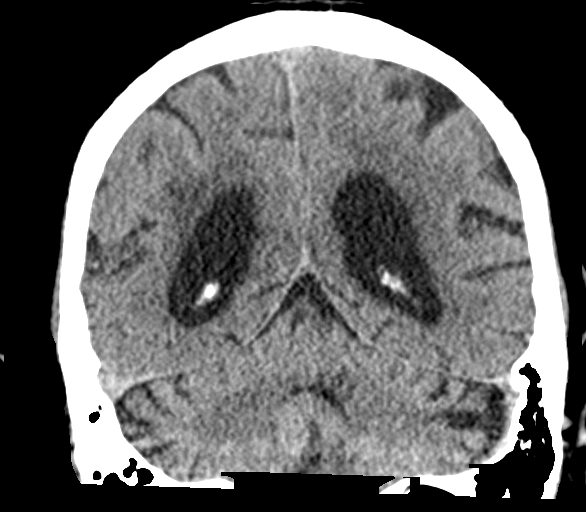
[im 30/68  brain]
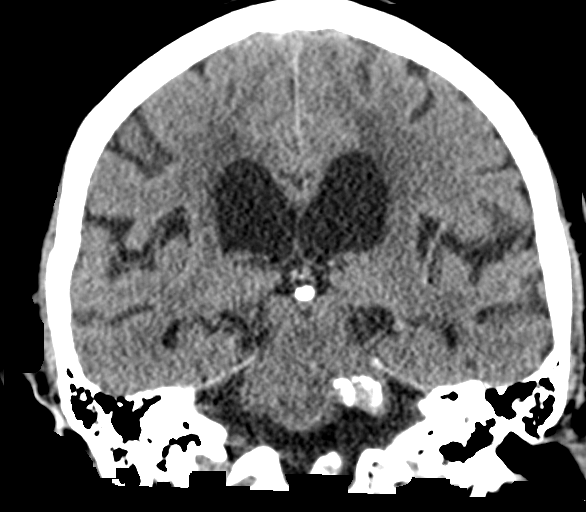
[im 38/68  brain]
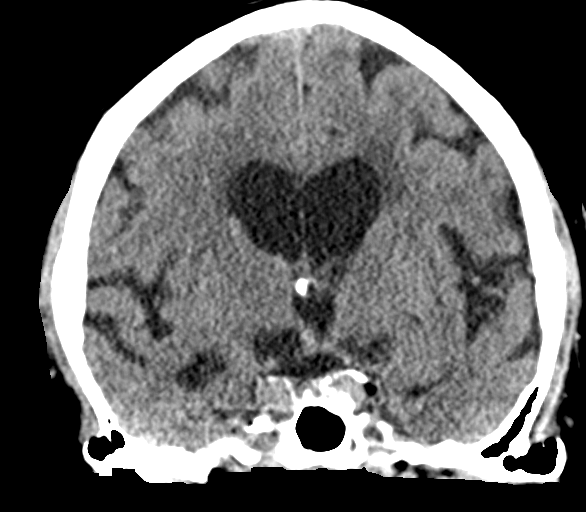

[Series 5: head 3.0 mpr sag · sagittal · 0.31mm/px · 3 of 55 slices shown]
[im 19/55  brain]
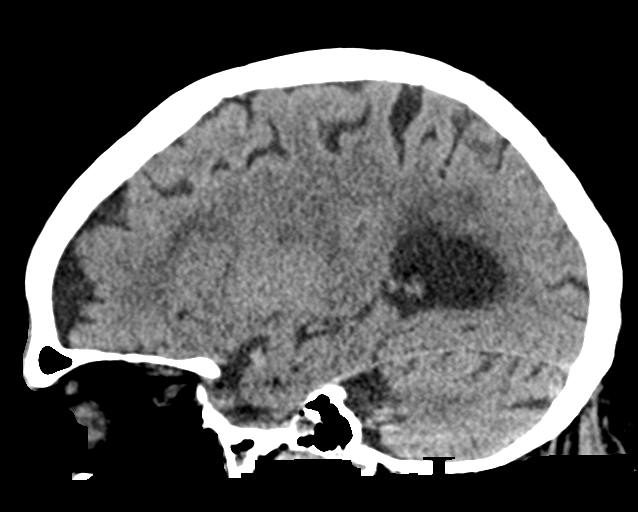
[im 28/55  brain]
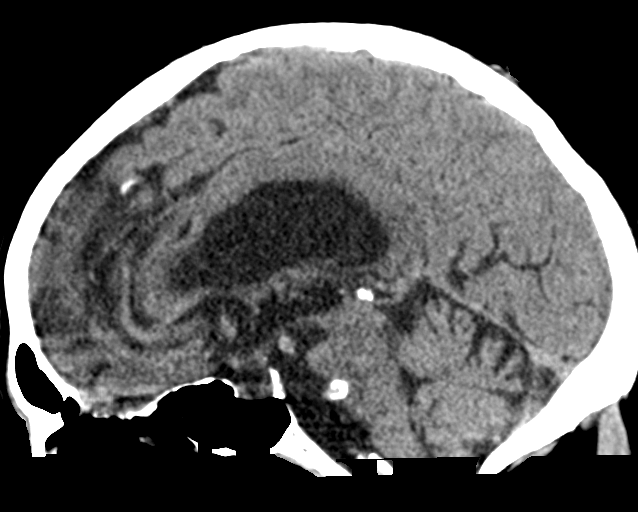
[im 37/55  brain]
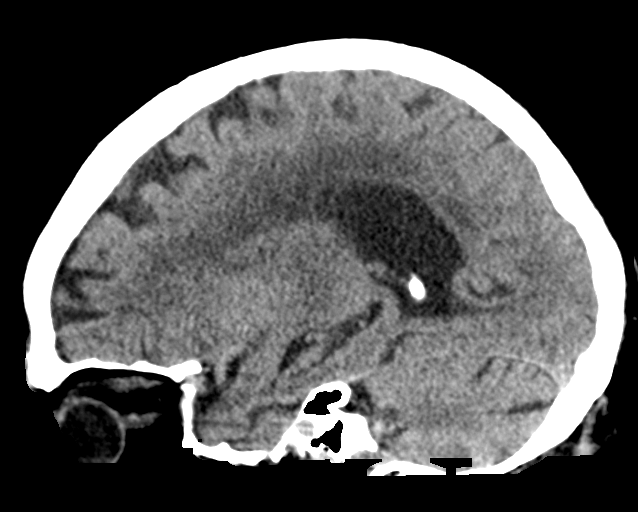

[16 of 47 positions shown; findings below may reference images not displayed]

FINDINGS: Brain: There is atrophy and chronic small vessel disease changes. No
acute intracranial abnormality. Specifically, no hemorrhage,
hydrocephalus, mass lesion, acute infarction, or significant
intracranial injury.

Vascular: Extensive atherosclerotic calcifications in the vessels at
the skull base, particularly the vertebrobasilar system.

Skull: No acute calvarial abnormality.

Sinuses/Orbits: Visualized paranasal sinuses and mastoids clear.
Orbital soft tissues unremarkable.

Other: None
IMPRESSION: No acute intracranial abnormality.

Atrophy, chronic microvascular disease.

## 2017-02-18 ENCOUNTER — Other Ambulatory Visit: Payer: Self-pay | Admitting: Allergy and Immunology

## 2017-02-20 ENCOUNTER — Encounter: Payer: Self-pay | Admitting: Family Medicine

## 2017-02-20 ENCOUNTER — Ambulatory Visit: Payer: PPO | Admitting: Family Medicine

## 2017-02-20 VITALS — BP 140/90 | HR 64 | Temp 98.0°F | Wt 164.0 lb

## 2017-02-20 DIAGNOSIS — R4189 Other symptoms and signs involving cognitive functions and awareness: Secondary | ICD-10-CM | POA: Diagnosis not present

## 2017-02-20 DIAGNOSIS — I10 Essential (primary) hypertension: Secondary | ICD-10-CM

## 2017-02-20 DIAGNOSIS — E039 Hypothyroidism, unspecified: Secondary | ICD-10-CM

## 2017-02-20 MED ORDER — LEVOTHYROXINE SODIUM 25 MCG PO TABS: 25.0000 ug | ORAL_TABLET | Freq: Every day | ORAL | 5 refills | Status: DC

## 2017-02-20 NOTE — Progress Notes (Signed)
Subjective:     Patient ID: Miguel Vazquez, male   DOB: [birth year], 76 y.o.   MRN: [id number]  HPI Patient seen here recently by nurse practitioner with family having concern for some acute confusion. He had CT of head which showed no acute changes. Labs were significant for TSH 7.75. No other recent TSH for comparison. Patient feels he is at baseline now- and disputes any recent confusion. He states he sleeps now more than unusual occasionally and has some fatigue but generally feels well.  He has not had any recent slurred speech and denies any focal weakness. There had been some question of his balance being off and he states his balance seems good at this time. His blood pressure was up at recent visit and again today. He takes losartan 50 mg daily and carvedilol 6.25 mg twice daily.  Patient had carotid Dopplers back in February 2018 which showed no obstruction  Past Medical History:  Diagnosis Date  . Arthritis   . Brain aneurysm 2018   followed by Dr [name]  . Cataract   . Coronary artery disease   . Diabetes mellitus   . GERD (gastroesophageal reflux disease)   . Hx of abdominal aortic aneurysm 1998  . Hyperlipidemia   . Hypertension   . Personal history of colonic polyps - adenomas 09/09/2013   Past Surgical History:  Procedure Laterality Date  . ABDOMINAL AORTIC ANEURYSM REPAIR  1998  . CARDIAC CATHETERIZATION N/A 03/18/2015   Procedure: Left Heart Cath and Coronary Angiography;  Surgeon: Peter M Martinique, MD;  Location: [place] [facility];  Service: Cardiovascular;  Laterality: N/A;  . cataract surgery Right   . COLONOSCOPY    . CORONARY ARTERY BYPASS GRAFT N/A 03/18/2015   Procedure: CORONARY ARTERY BYPASS GRAFTING (CABG) x  three, using left internal mammary artery and right leg greater saphenous vein harvested endoscopically;  Surgeon: Melrose Nakayama, MD;  Location: [place];  Service: Open Heart Surgery;  Laterality: N/A;  . INGUINAL HERNIA REPAIR Bilateral   .  INTRAOPERATIVE TRANSESOPHAGEAL ECHOCARDIOGRAM N/A 03/18/2015   Procedure: INTRAOPERATIVE TRANSESOPHAGEAL ECHOCARDIOGRAM;  Surgeon: Melrose Nakayama, MD;  Location: [place];  Service: Open Heart Surgery;  Laterality: N/A;  . open heart surgery  2016  . [place]    reports that he quit smoking about 30 years ago. His smoking use included cigarettes. He has a 30.00 pack-year smoking history. he has never used smokeless tobacco. He reports that he drinks alcohol. He reports that he does not use drugs. family history includes Bone cancer in his mother; Diabetes in his paternal aunt; Down syndrome in his son; Stroke in his son. Allergies  Allergen Reactions  . Other     Powdered eggs- swelling, widespread rash  . Prednisone     "psychosis"  . Venomil Honey Bee Venom [Honey Bee Venom] Swelling    Localized swelling     Review of Systems  Constitutional: Negative for fatigue.  Eyes: Negative for visual disturbance.  Respiratory: Negative for cough, chest tightness and shortness of breath.   Cardiovascular: Negative for chest pain, palpitations and leg swelling.  Neurological: Negative for dizziness, syncope, weakness, light-headedness and headaches.       Objective:   Physical Exam  Constitutional: He is oriented to person, place, and time. He appears well-developed and well-nourished.  HENT:  Right Ear: External ear normal.  Left Ear: External ear normal.  Mouth/Throat: Oropharynx is clear and moist.  Eyes: Pupils are equal,  round, and reactive to light.  Neck: Neck supple. No thyromegaly present.  Cardiovascular: Normal rate and regular rhythm.  Pulmonary/Chest: Effort normal and breath sounds normal. No respiratory distress. He has no wheezes. He has no rales.  Musculoskeletal: He exhibits no edema.  Neurological: He is alert and oriented to person, place, and time.       Assessment:     #1 recent episode of possible transient cognitive changes.  Differential would include TIA versus CVA versus other.  #2 hypertension poorly controlled with repeat reading  today left arm seated after rest 160/80  #3 elevated TSH    Plan:     -Start low-dose levothyroxine 25 g once daily and will plan to reassess TSH in 2-3 months -Increase losartan to 50 mg 2 tablets once daily until follow-up in 2 weeks to reassess -Continue daily aspirin -We discussed consideration for MRI scan to help define recent events more but he declines at this time -Follow-up immediately for any slurred speech, focal weakness, or other concerns  Eulas Post MD [facility] at [facility]

## 2017-02-20 NOTE — Patient Instructions (Signed)
Start the Levothyroxine (thyroid med) one daily Increase the Losartan 50 mg to two tablets once daily.

## 2017-03-02 ENCOUNTER — Other Ambulatory Visit: Payer: Self-pay | Admitting: Neurology

## 2017-03-02 DIAGNOSIS — E1121 Type 2 diabetes mellitus with diabetic nephropathy: Secondary | ICD-10-CM

## 2017-03-06 ENCOUNTER — Other Ambulatory Visit: Payer: Self-pay

## 2017-03-06 ENCOUNTER — Encounter (HOSPITAL_COMMUNITY): Payer: Self-pay

## 2017-03-06 ENCOUNTER — Encounter: Payer: Self-pay | Admitting: Family Medicine

## 2017-03-06 ENCOUNTER — Emergency Department (HOSPITAL_COMMUNITY): Payer: PPO

## 2017-03-06 ENCOUNTER — Emergency Department (HOSPITAL_COMMUNITY)
Admission: EM | Admit: 2017-03-06 | Discharge: 2017-03-06 | Disposition: A | Discharge status: Home / Self Care | Payer: PPO | Attending: Emergency Medicine | Admitting: Emergency Medicine

## 2017-03-06 ENCOUNTER — Ambulatory Visit: Payer: PPO | Admitting: Family Medicine

## 2017-03-06 VITALS — BP 160/90 | HR 90 | Temp 97.9°F | Wt 166.2 lb

## 2017-03-06 DIAGNOSIS — F29 Unspecified psychosis not due to a substance or known physiological condition: Secondary | ICD-10-CM | POA: Diagnosis not present

## 2017-03-06 DIAGNOSIS — R410 Disorientation, unspecified: Secondary | ICD-10-CM | POA: Insufficient documentation

## 2017-03-06 DIAGNOSIS — Z7982 Long term (current) use of aspirin: Secondary | ICD-10-CM | POA: Diagnosis not present

## 2017-03-06 DIAGNOSIS — Z91030 Bee allergy status: Secondary | ICD-10-CM | POA: Insufficient documentation

## 2017-03-06 DIAGNOSIS — I672 Cerebral atherosclerosis: Secondary | ICD-10-CM | POA: Diagnosis not present

## 2017-03-06 DIAGNOSIS — E039 Hypothyroidism, unspecified: Secondary | ICD-10-CM | POA: Insufficient documentation

## 2017-03-06 DIAGNOSIS — Z951 Presence of aortocoronary bypass graft: Secondary | ICD-10-CM | POA: Diagnosis not present

## 2017-03-06 DIAGNOSIS — I2510 Atherosclerotic heart disease of native coronary artery without angina pectoris: Secondary | ICD-10-CM | POA: Insufficient documentation

## 2017-03-06 DIAGNOSIS — Z87891 Personal history of nicotine dependence: Secondary | ICD-10-CM | POA: Insufficient documentation

## 2017-03-06 DIAGNOSIS — Z8679 Personal history of other diseases of the circulatory system: Secondary | ICD-10-CM | POA: Diagnosis not present

## 2017-03-06 DIAGNOSIS — Z79899 Other long term (current) drug therapy: Secondary | ICD-10-CM | POA: Insufficient documentation

## 2017-03-06 DIAGNOSIS — I10 Essential (primary) hypertension: Secondary | ICD-10-CM

## 2017-03-06 DIAGNOSIS — R4182 Altered mental status, unspecified: Secondary | ICD-10-CM | POA: Diagnosis present

## 2017-03-06 DIAGNOSIS — E1129 Type 2 diabetes mellitus with other diabetic kidney complication: Secondary | ICD-10-CM | POA: Insufficient documentation

## 2017-03-06 DIAGNOSIS — I252 Old myocardial infarction: Secondary | ICD-10-CM | POA: Diagnosis not present

## 2017-03-06 DIAGNOSIS — Z7984 Long term (current) use of oral hypoglycemic drugs: Secondary | ICD-10-CM | POA: Diagnosis not present

## 2017-03-06 DIAGNOSIS — F4489 Other dissociative and conversion disorders: Secondary | ICD-10-CM | POA: Diagnosis not present

## 2017-03-06 LAB — ETHANOL: Alcohol, Ethyl (B): <10 mg/dL (ref <10)

## 2017-03-06 LAB — RAPID URINE DRUG SCREEN, HOSP PERFORMED
Amphetamines: NOT DETECTED
Barbiturates: NOT DETECTED
Benzodiazepines: NOT DETECTED
COCAINE: NOT DETECTED
OPIATES: NOT DETECTED
TETRAHYDROCANNABINOL: NOT DETECTED

## 2017-03-06 LAB — CBC WITH DIFFERENTIAL/PLATELET
BASOS ABS: 0.1 10*3/uL (ref 0.0–0.1)
BASOS PCT: 1 %
EOS ABS: 0.4 10*3/uL (ref 0.0–0.7)
EOS PCT: 6 %
HCT: 40.7 % (ref 39.0–52.0)
HEMOGLOBIN: 14 g/dL (ref 13.0–17.0)
Lymphocytes Relative: 17 %
Lymphs Abs: 1.3 10*3/uL (ref 0.7–4.0)
MCH: 33.3 pg (ref 26.0–34.0)
MCHC: 34.4 g/dL (ref 30.0–36.0)
MCV: 96.9 fL (ref 78.0–100.0)
Monocytes Absolute: 0.5 10*3/uL (ref 0.1–1.0)
Monocytes Relative: 7 %
NEUTROS PCT: 69 %
Neutro Abs: 5.1 10*3/uL (ref 1.7–7.7)
PLATELETS: 128 10*3/uL — AB (ref 150–400)
RBC: 4.2 MIL/uL — AB (ref 4.22–5.81)
RDW: 13.3 % (ref 11.5–15.5)
WBC: 7.4 10*3/uL (ref 4.0–10.5)

## 2017-03-06 LAB — URINALYSIS, COMPLETE (UACMP) WITH MICROSCOPIC
BILIRUBIN URINE: NEGATIVE
GLUCOSE, UA: NEGATIVE mg/dL
HGB URINE DIPSTICK: NEGATIVE
KETONES UR: NEGATIVE mg/dL
LEUKOCYTES UA: NEGATIVE
NITRITE: NEGATIVE
PH: 7 (ref 5.0–8.0)
Protein, ur: 30 mg/dL — AB
Specific Gravity, Urine: 1.017 (ref 1.005–1.030)
Squamous Epithelial / LPF: NONE SEEN

## 2017-03-06 LAB — COMPREHENSIVE METABOLIC PANEL
ALT: 17 U/L (ref 17–63)
AST: 27 U/L (ref 15–41)
Albumin: 3.7 g/dL (ref 3.5–5.0)
Alkaline Phosphatase: 56 U/L (ref 38–126)
Anion gap: 12 (ref 5–15)
BILIRUBIN TOTAL: 0.9 mg/dL (ref 0.3–1.2)
BUN: 18 mg/dL (ref 6–20)
CHLORIDE: 103 mmol/L (ref 101–111)
CO2: 25 mmol/L (ref 22–32)
CREATININE: 0.97 mg/dL (ref 0.61–1.24)
Calcium: 9.4 mg/dL (ref 8.9–10.3)
Glucose, Bld: 75 mg/dL (ref 65–99)
Potassium: 4 mmol/L (ref 3.5–5.1)
Sodium: 140 mmol/L (ref 135–145)
TOTAL PROTEIN: 6.3 g/dL — AB (ref 6.5–8.1)

## 2017-03-06 LAB — CBG MONITORING, ED: Glucose-Capillary: 70 mg/dL (ref 65–99)

## 2017-03-06 LAB — POCT GLUCOSE (DEVICE FOR HOME USE): POC Glucose: 107 mg/dl — AB (ref 70–99)

## 2017-03-06 IMAGING — CT CT HEAD W/O CM
3 of 4 series · 16 of 47 positions shown, 19 images · non-contrast
Comparison: [DATE]

CLINICAL DATA: Greater confusion and normal.

EXAM:
CT HEAD WITHOUT CONTRAST
TECHNIQUE: Contiguous axial images were obtained from the base of the skull
through the vertex without intravenous contrast.

[Series 4: head 2.0 h70h · axial · 0.45mm/px · z∈[-194,-34]mm · 10 of 94 slices shown, 13 images]
[im 9/94  brain]
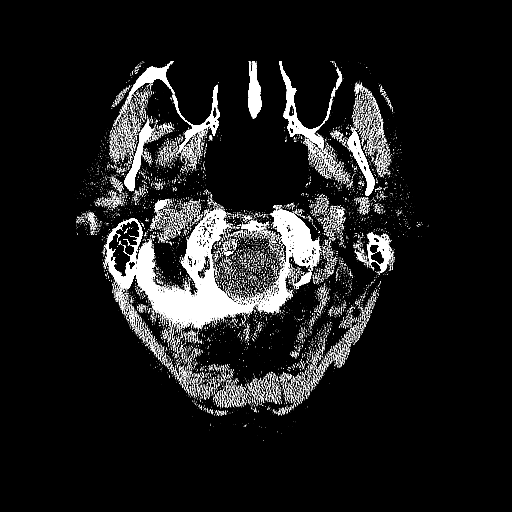
[im 9/94  bone]
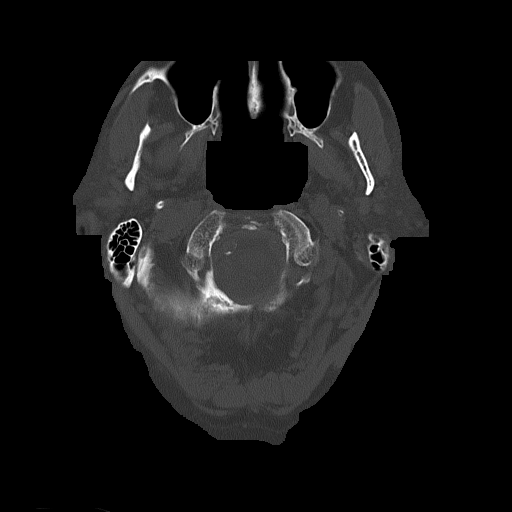
[im 18/94  brain]
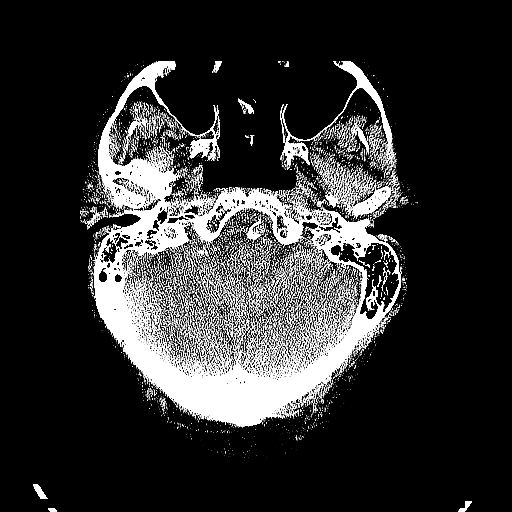
[im 27/94  brain]
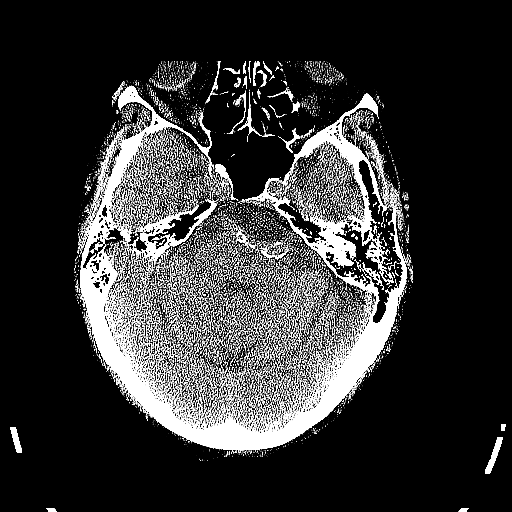
[im 36/94  brain]
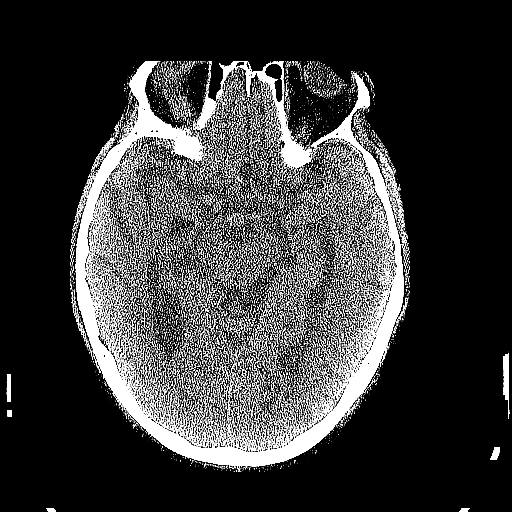
[im 45/94  brain]
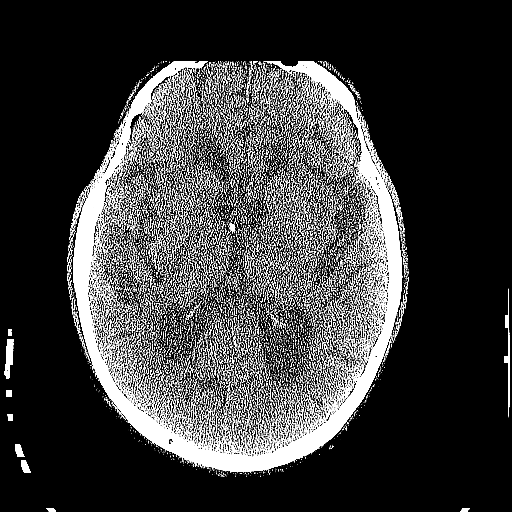
[im 45/94  bone]
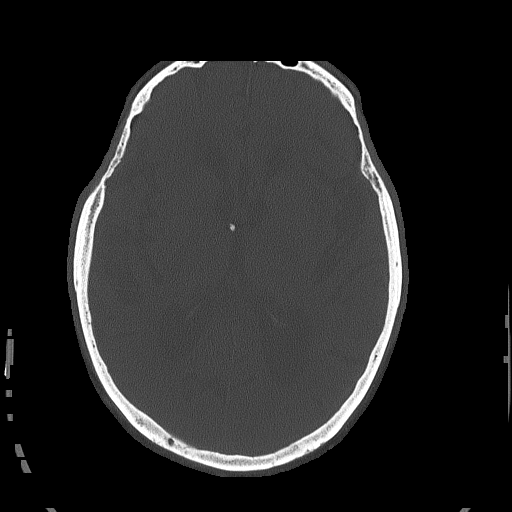
[im 54/94  brain]
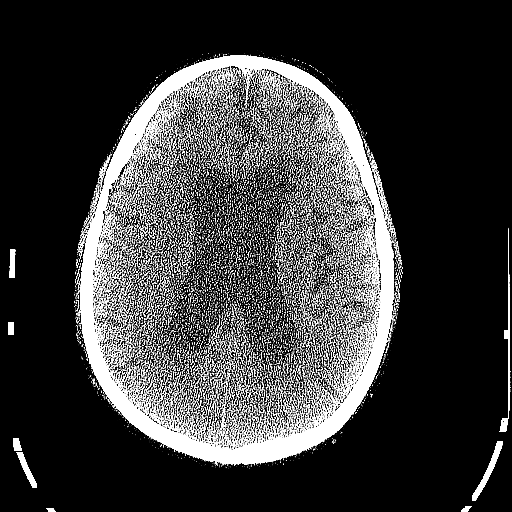
[im 63/94  brain]
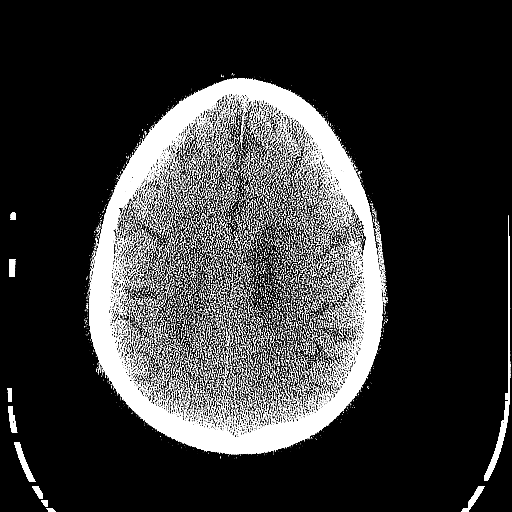
[im 71/94  brain]
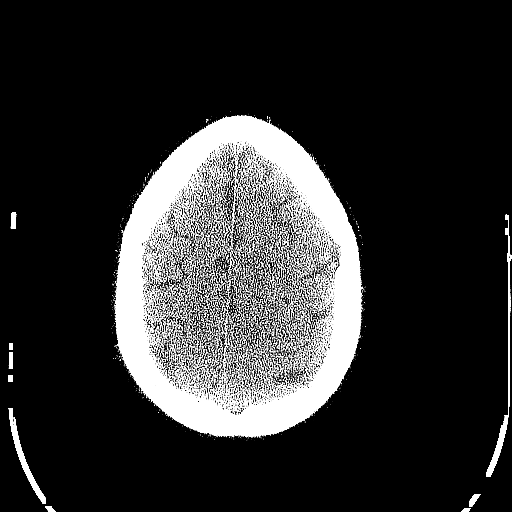
[im 80/94  brain]
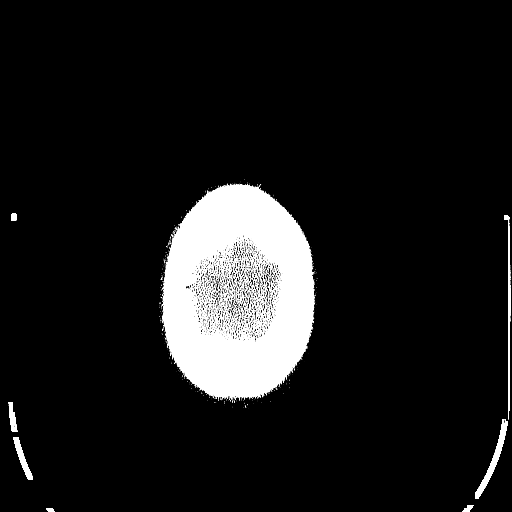
[im 80/94  bone]
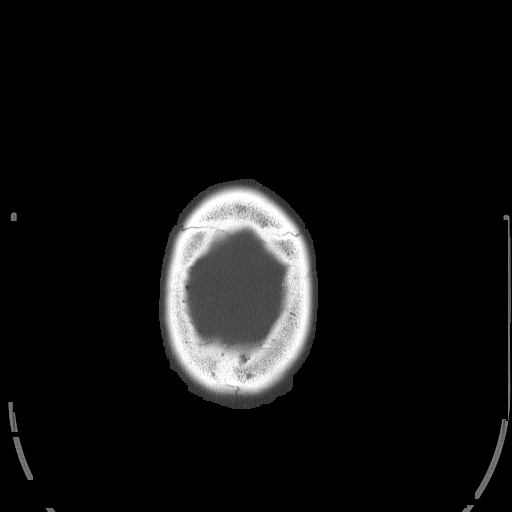
[im 89/94  brain]
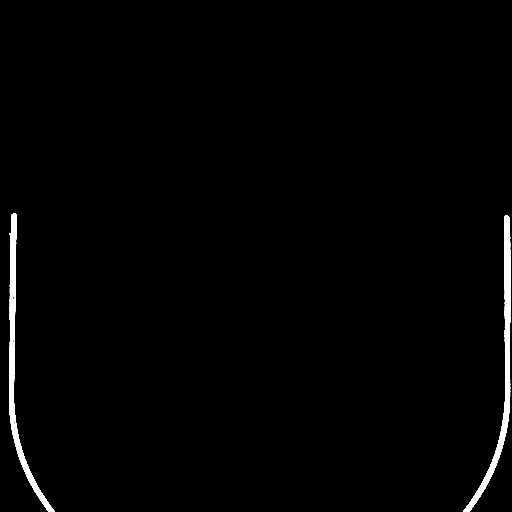

[Series 5: head 3.0 mpr cor · coronal · 0.37mm/px · 3 of 74 slices shown]
[im 25/74  brain]
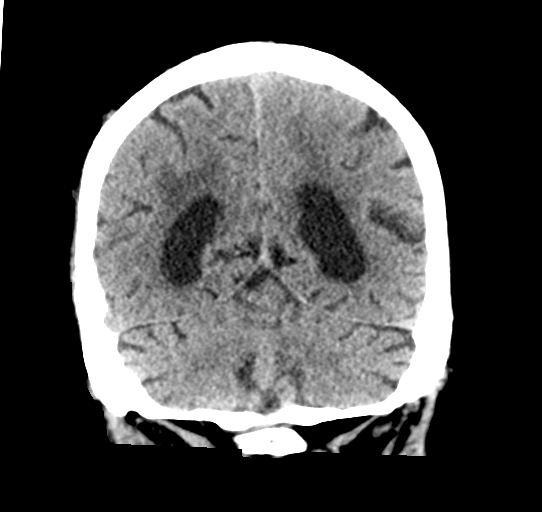
[im 33/74  brain]
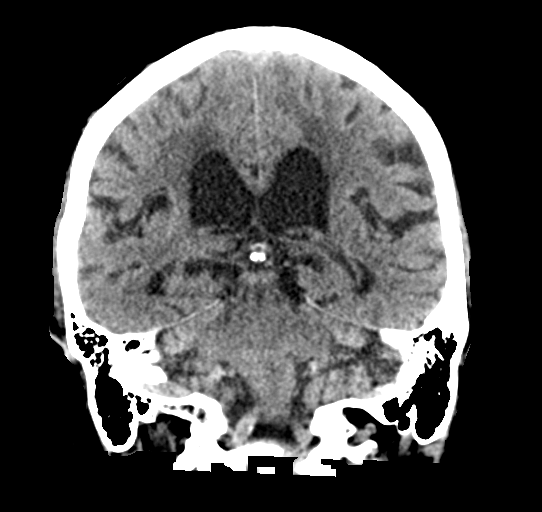
[im 41/74  brain]
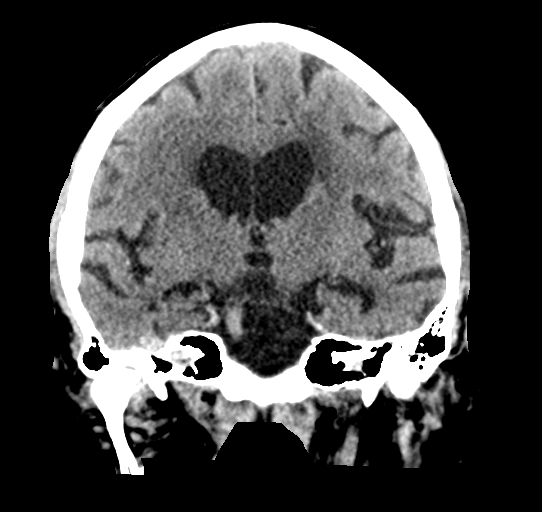

[Series 6: head 3.0 mpr sag · sagittal · 0.37mm/px · 3 of 61 slices shown]
[im 21/61  brain]
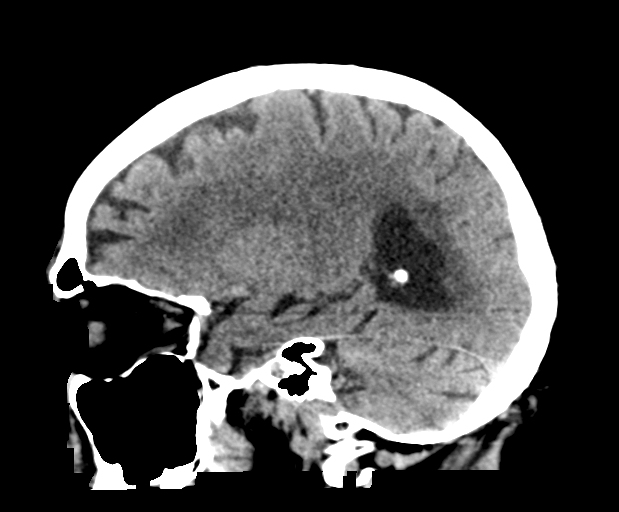
[im 31/61  brain]
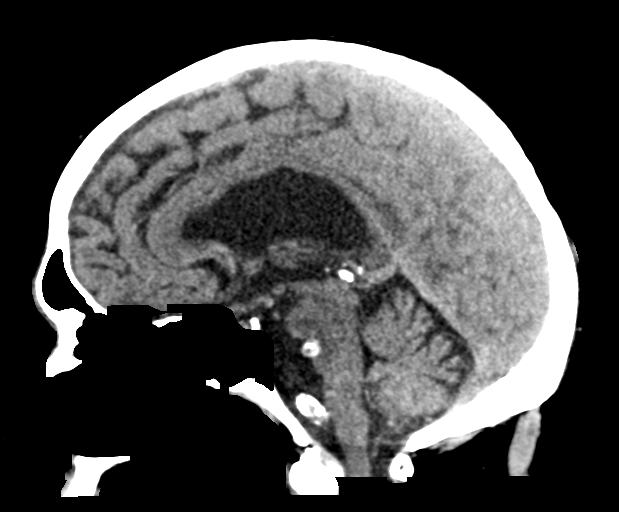
[im 41/61  brain]
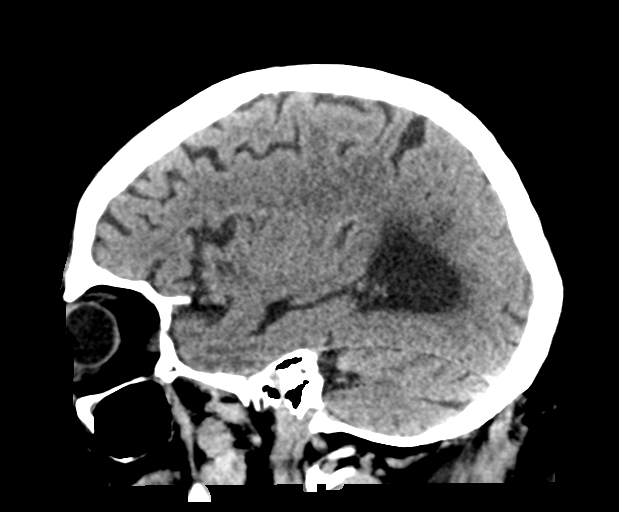

[16 of 47 positions shown; findings below may reference images not displayed]

FINDINGS: Brain: No evidence of acute infarction, hemorrhage, hydrocephalus,
extra-axial collection or mass lesion/mass effect. Generalized
atrophy that is moderate. Confluent low-density in the
periventricular white matter attributed to chronic small vessel
ischemia. Incidental calcification at the foramen of KIMONE.

Vascular: Atherosclerotic calcification and vertebrobasilar
tortuosity with brainstem deformity.

Skull: No acute or aggressive finding.

Sinuses/Orbits: Bilateral cataract resection.  No acute finding.
IMPRESSION: 1. No acute finding or change from prior.
2. Moderate atrophy and chronic small vessel ischemia.
3. Atherosclerosis and vertebrobasilar tortuosity.

## 2017-03-06 MED ORDER — SODIUM CHLORIDE 0.9 % IV SOLN
INTRAVENOUS | Status: DC
  Administered 2017-03-06: 19:00:00 via INTRAVENOUS

## 2017-03-06 MED ORDER — SODIUM CHLORIDE 0.9 % IV BOLUS (SEPSIS)
1000.0000 mL | Freq: Once | INTRAVENOUS | Status: AC
  Administered 2017-03-06: 1000 mL via INTRAVENOUS

## 2017-03-06 NOTE — Progress Notes (Signed)
Subjective:     Patient ID: Miguel Vazquez, male   DOB: [birth year], 76 y.o.   MRN: [id number]  HPI Patient is accompanied by wife. He was initially scheduled today for follow-up visit to reassess blood pressure and wife states even as they were getting ready to come here he seemed more acutely confused. She also noted that he seemed to be more "off-balance ". He had recent acute confusion and was evaluated here by NP and had CT of the head which showed no acute findings.  TSH was slightly elevated.  Refer to recent note from 02/20/17   "Patient seen here recently by nurse practitioner with family having concern for some acute confusion. He had CT of head which showed no acute changes. Labs were significant for TSH 7.75. No other recent TSH for comparison. Patient feels he is at baseline now- and disputes any recent confusion. He states he sleeps now more than unusual occasionally and has some fatigue but generally feels well.  He has not had any recent slurred speech and denies any focal weakness. There had been some question of his balance being off and he states his balance seems good at this time. His blood pressure was up at recent visit and again today. He takes losartan 50 mg daily and carvedilol 6.25 mg twice daily.  Patient had carotid Dopplers back in February 2018 which showed no obstruction"  Wife noted that when they got in the car to come here he had difficulty starting up the car and she started questioning about what different lights on dash represented he had no clue. He is disoriented regarding day of week and year. They have not noted any recent slurred speech. No fever. No chest pain. No headache.  Chronic problems include history of CAD, hypertension, ischemic cardiomyopathy, type 2 diabetes, recently diagnosed hypothyroidism, peripheral vascular disease, hyperlipidemia.   Past Medical History:  Diagnosis Date  . Arthritis   . Brain aneurysm 2018   followed by Dr [name]  .  Cataract   . Coronary artery disease   . Diabetes mellitus   . GERD (gastroesophageal reflux disease)   . Hx of abdominal aortic aneurysm 1998  . Hyperlipidemia   . Hypertension   . Personal history of colonic polyps - adenomas 09/09/2013   Past Surgical History:  Procedure Laterality Date  . ABDOMINAL AORTIC ANEURYSM REPAIR  1998  . CARDIAC CATHETERIZATION N/A 03/18/2015   Procedure: Left Heart Cath and Coronary Angiography;  Surgeon: Peter M Martinique, MD;  Location: [place] [facility];  Service: Cardiovascular;  Laterality: N/A;  . cataract surgery Right   . COLONOSCOPY    . CORONARY ARTERY BYPASS GRAFT N/A 03/18/2015   Procedure: CORONARY ARTERY BYPASS GRAFTING (CABG) x  three, using left internal mammary artery and right leg greater saphenous vein harvested endoscopically;  Surgeon: Melrose Nakayama, MD;  Location: [place];  Service: Open Heart Surgery;  Laterality: N/A;  . INGUINAL HERNIA REPAIR Bilateral   . INTRAOPERATIVE TRANSESOPHAGEAL ECHOCARDIOGRAM N/A 03/18/2015   Procedure: INTRAOPERATIVE TRANSESOPHAGEAL ECHOCARDIOGRAM;  Surgeon: Melrose Nakayama, MD;  Location: [place];  Service: Open Heart Surgery;  Laterality: N/A;  . open heart surgery  2016  . [place]    reports that he quit smoking about 30 years ago. His smoking use included cigarettes. He has a 30.00 pack-year smoking history. he has never used smokeless tobacco. He reports that he drinks alcohol. He reports that he does not use drugs. family history  includes Bone cancer in his mother; Diabetes in his paternal aunt; Down syndrome in his son; Stroke in his son. Allergies  Allergen Reactions  . Other     Powdered eggs- swelling, widespread rash  . Prednisone     "psychosis"  . Venomil Honey Bee Venom [Honey Bee Venom] Swelling    Localized swelling      Review of Systems  Constitutional: Negative for chills and fever.  Eyes: Negative for visual disturbance.  Respiratory: Negative for  shortness of breath.   Cardiovascular: Negative for chest pain and palpitations.  Gastrointestinal: Negative for abdominal pain, nausea and vomiting.  Genitourinary: Negative for dysuria.  Neurological: Negative for dizziness, syncope, weakness and headaches.  Psychiatric/Behavioral: Positive for confusion. Negative for agitation.       Objective:   Physical Exam  Constitutional: He appears well-developed and well-nourished.  Neck: Neck supple.  Cardiovascular: Normal rate and regular rhythm.  Pulmonary/Chest: Effort normal and breath sounds normal. No respiratory distress. He has no wheezes. He has no rales.  Musculoskeletal: He exhibits no edema.  Neurological: He is alert. No cranial nerve deficit. Coordination normal.  Patient is disoriented to day of week, year, place No focal strength deficits       Assessment:     Acute confusional state. Wife describes onset of symptoms about one hour ago. This is occurring in juxtaposition to recent episode of acute confusion several weeks ago-?TIA vs CVA vs other .  Needs further evaluation    Plan:     -We recommended transfer by EMS to emergency room for further evaluation -Will likely need admission for further workup -Had recent CT which showed no acute findings. Will need MRI and other evaluations  Eulas Post MD [facility] at [facility]

## 2017-03-06 NOTE — ED Triage Notes (Signed)
Pt arrives via ems from pcp. PT has been more confused than normal. Pt wife reports he got in car and did not understand how to crank the car and didn't know what anything on the dash was. PT normally drives. Pt is aware that he is confused. LKW unclear but ~0800 today. Pt wife reports he seems to be more off balance.  LVO negative per EMS.

## 2017-03-06 NOTE — ED Notes (Signed)
Wife reports that pt got in the car that he normally drives and was unsure how to operate it. Wife reports she drove and pt was arguing and confused. She reports that she noticed pt stumbling. She reports milder issues over the past 3 months with more confusion over past 3 months.

## 2017-03-06 NOTE — ED Provider Notes (Signed)
[facility] EMERGENCY DEPARTMENT Provider Note   CSN: [id number] Arrival date & time: 03/06/17  1614     History   Chief Complaint Chief Complaint  Patient presents with  . Altered Mental Status    HPI Miguel Vazquez is a 76 y.o. male.  HPI   76 year old male with history of brain aneurysm, diabetes, CAD, GERD, hypertension brought here via EMS from his doctor's office for evaluation of altered mental status.  History obtained through patient and through wife who is at bedside.  Per wife, for the past several months patient has become increasingly more forgetful, and more moody than usual.  This morning he is scheduled to have a checkup by his PCP.  Wife noticed that he was more confused than usual and when he was about to drive, he appears to forget how to operate his car he normally did.  At the urging of his primary care provider, patient brought here for further evaluation of his condition.  The patient states he did feel "a bit off" earlier this morning.  States that when he wake up he felt fine but after walking outside but an hour later after he got up, he felt a bit unsteady.  He denies any associated headache, vision changes, lightheadedness, dizziness, chest pain, abdominal pain, shortness of breath, focal numbness or weakness.  He denies any dysuria.  Denies any recent alcohol use or change in medication.  He does admit that he feels depressed having to take care of his mentally handicapped son who recently had several strokes.  He admits to drinking a small amount of alcohol nightly but that has not changed.  Past Medical History:  Diagnosis Date  . Arthritis   . Brain aneurysm 2018   followed by Dr [name]  . Cataract   . Coronary artery disease   . Diabetes mellitus   . GERD (gastroesophageal reflux disease)   . Hx of abdominal aortic aneurysm 1998  . Hyperlipidemia   . Hypertension   . Personal history of colonic polyps - adenomas 09/09/2013    Patient  Active Problem List   Diagnosis Date Noted  . Hypothyroidism 02/20/2017  . Pressure injury of skin 09/03/2016  . Angioedema 09/02/2016  . Cerebral atherosclerosis 01/18/2016  . Brain aneurysm 01/06/2016  . Headache 01/06/2016  . CAD (coronary artery disease) of artery bypass graft 12/22/2015  . 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 Cardiomyopathy, 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  . Scrotal inguinal hernia s/p lap repair w mesh 03/15/2015 03/21/2015  . STEMI 03/18/15 03/18/2015  . 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 Personal history of colonic polyps - adenomas 09/09/2013  . Obesity (BMI 30-39.9) 11/18/2012  . History of AAA (abdominal aortic aneurysm) repair 11/18/2012  . BACK PAIN, UPPER 08/10/2009  . Type 2 diabetes mellitus with renal manifestations ([place]) 10/15/2008  . Hyperlipidemia 10/15/2008  . Hypertension 10/15/2008    Past Surgical History:  Procedure Laterality Date  . ABDOMINAL AORTIC ANEURYSM REPAIR  1998  . CARDIAC CATHETERIZATION N/A 03/18/2015   Procedure: Left Heart Cath and Coronary Angiography;  Surgeon: Peter M Martinique, MD;  Location: [facility];  Service: Cardiovascular;  Laterality: N/A;  . cataract surgery Right   . COLONOSCOPY    . CORONARY ARTERY BYPASS GRAFT N/A 03/18/2015   Procedure: CORONARY ARTERY BYPASS GRAFTING (CABG) x  three, using left internal mammary artery and right leg greater saphenous vein harvested endoscopically;  Surgeon: Melrose Nakayama, MD;  Location: [facility];  Service: Open Heart Surgery;  Laterality: N/A;  . INGUINAL HERNIA REPAIR Bilateral   . INTRAOPERATIVE TRANSESOPHAGEAL ECHOCARDIOGRAM N/A 03/18/2015   Procedure: INTRAOPERATIVE TRANSESOPHAGEAL ECHOCARDIOGRAM;  Surgeon: Melrose Nakayama, MD;  Location: [facility];  Service: Open Heart Surgery;  Laterality: N/A;  . open heart surgery  2016  . [place] Medications    Prior to Admission medications   Medication Sig Start Date End Date Taking? Authorizing  Provider  aspirin EC 325 MG EC tablet Take 1 tablet (325 mg total) by mouth daily. 01/18/16   Rosalin Hawking, MD  atorvastatin (LIPITOR) 80 MG tablet Take 1 tablet (80 mg total) by mouth daily at 6 PM. 01/14/17   Rosalin Hawking, MD  carvedilol (COREG) 6.25 MG tablet TAKE 1 TABLET (6.25 MG TOTAL) BY MOUTH 2 (TWO) TIMES DAILY. 11/30/16   Martinique, Peter M, MD  EPINEPHrine 0.3 mg/0.3 mL IJ SOAJ injection Inject 0.3 mLs (0.3 mg total) into the muscle as needed (tongue swelling). 09/04/16   Magdalen Spatz, NP  glucose blood (FREESTYLE LITE) test strip Check Blood Sugars 1-2 times per day. DX: E11.9. 01/10/16   [name], [name], MD  Lancets (FREESTYLE) lancets Check Blood Sugars 1-2 times per day. DX: E11.9 01/10/16   [name], [name], MD  levothyroxine (SYNTHROID, LEVOTHROID) 25 MCG tablet Take 1 tablet (25 mcg total) by mouth daily before breakfast. 02/20/17   [name], [name], MD  losartan (COZAAR) 50 MG tablet TAKE 1 TABLET BY MOUTH EVERY DAY Patient taking differently: TAKE 2 TABLET BY MOUTH EVERY DAY 02/18/17   Kozlow, Donnamarie Poag, MD  metFORMIN (GLUCOPHAGE) 1000 MG tablet Take 1 tablet (1,000 mg total) by mouth 2 (two) times daily. 01/18/16   Rosalin Hawking, MD  Multiple Vitamin (MULTIVITAMIN) tablet Take 1 tablet by mouth daily. Centrium Silver once daily     [provider]  vitamin C (ASCORBIC ACID) 500 MG tablet Take 500 mg by mouth daily.    [provider]    Family History Family History  Problem Relation Age of Onset  . Bone cancer Mother   . Diabetes Paternal Aunt   . Stroke Son   . Down syndrome Son   . Colon cancer Neg Hx     Social History Social History   Tobacco Use  . Smoking status: Former Smoker    Packs/day: 1.50    Years: 20.00    Pack years: 30.00    Types: Cigarettes    Last attempt to quit: 08/24/1986    Years since quitting: 30.5  . Smokeless tobacco: Never Used  Substance Use Topics  . Alcohol use: Yes    Comment: occ  . Drug use: No      Allergies   Other; Prednisone; and Venomil honey bee venom [honey bee venom]   Review of Systems Review of Systems  All other systems reviewed and are negative.    Physical Exam Updated Vital Signs Ht 5\' 8"  (1.727 m)   Wt 75.3 kg (166 lb)   BMI 25.24 kg/m   Physical Exam  Constitutional: He appears well-developed and well-nourished. No distress.  HENT:  Head: Atraumatic.  Mouth/Throat: Oropharynx is clear and moist.  Eyes: Conjunctivae and EOM are normal. Pupils are equal, round, and reactive to light.  Neck: Normal range of motion. Neck supple.  No nuchal rigidity  Cardiovascular: Normal rate and regular rhythm.  Pulmonary/Chest: Effort normal and breath sounds normal.  Abdominal: Soft. He  exhibits no distension. There is no tenderness.  Musculoskeletal: Normal range of motion.  Neurological: He is alert.  Neurologic exam:  Speech clear, pupils equal round reactive to light, extraocular movements intact  Normal peripheral visual fields Cranial nerves III through XII normal including no facial droop Follows commands, moves all extremities x4, normal strength to bilateral upper and lower extremities at all major muscle groups including grip Sensation normal to light touch and pinprick Coordination intact, no limb ataxia, finger-nose-finger normal Rapid alternating movements normal No pronator drift Gait normal Alert to self, place, situation, but forget the month and current president  Skin: No rash noted.  Psychiatric: He has a normal mood and affect.  Nursing note and vitals reviewed.    ED Treatments / Results  Labs (all labs ordered are listed, but only abnormal results are displayed) Labs Reviewed  COMPREHENSIVE METABOLIC PANEL - Abnormal; Notable for the following components:      Result Value   Total Protein 6.3 (*)    All other components within normal limits  CBC WITH DIFFERENTIAL/PLATELET - Abnormal; Notable for the following components:   RBC  4.20 (*)    Platelets 128 (*)    All other components within normal limits  URINALYSIS, COMPLETE (UACMP) WITH MICROSCOPIC - Abnormal; Notable for the following components:   Protein, ur 30 (*)    Bacteria, UA RARE (*)    All other components within normal limits  ETHANOL  RAPID URINE DRUG SCREEN, HOSP PERFORMED  CBG MONITORING, ED    EKG  EKG Interpretation  Date/Time:  Wednesday March 06 2017 16:28:01 EST Ventricular Rate:  74 PR Interval:    QRS Duration: 101 QT Interval:  392 QTC Calculation: 435 R Axis:   -10 Text Interpretation:  Sinus rhythm Posterior infarct, old Nonspecific T abnormalities, diffuse leads Baseline wander in lead(s) V2 T wave changes new since Nov 2018 Confirmed by Sherwood Gambler [phone]) on 03/06/2017 4:34:04 PM       Radiology Ct Head Wo Contrast  Result Date: 03/06/2017 CLINICAL DATA:  Greater confusion and normal. EXAM: CT HEAD WITHOUT CONTRAST TECHNIQUE: Contiguous axial images were obtained from the base of the skull through the vertex without intravenous contrast. COMPARISON:  02/14/2017 FINDINGS: Brain: No evidence of acute infarction, hemorrhage, hydrocephalus, extra-axial collection or mass lesion/mass effect. Generalized atrophy that is moderate. Confluent low-density in the periventricular white matter attributed to chronic small vessel ischemia. Incidental calcification at the foramen of [place]ro. Vascular: Atherosclerotic calcification and vertebrobasilar tortuosity with brainstem deformity. Skull: No acute or aggressive finding. Sinuses/Orbits: Bilateral cataract resection.  No acute finding. IMPRESSION: 1. No acute finding or change from prior. 2. Moderate atrophy and chronic small vessel ischemia. 3. Atherosclerosis and vertebrobasilar tortuosity. Electronically Signed   By: Monte Fantasia M.D.   On: 03/06/2017 17:15    Procedures Procedures (including critical care time)  Medications Ordered in ED Medications  sodium chloride 0.9 % bolus  1,000 mL (0 mLs Intravenous Stopped 03/06/17 1827)    And  0.9 %  sodium chloride infusion ( Intravenous New Bag/Given 03/06/17 1839)     Initial Impression / Assessment and Plan / ED Course  I have reviewed the triage vital signs and the nursing notes.  Pertinent labs & imaging results that were available during my care of the patient were reviewed by me and considered in my medical decision making (see chart for details).     BP (!) 148/82   Pulse 74   Resp (!) 23  Ht 5\' 8"  (1.727 m)   Wt 75.3 kg (166 lb)   SpO2 96%   BMI 25.24 kg/m    Final Clinical Impressions(s) / ED Diagnoses   Final diagnoses:  Confusion    ED Discharge Orders    None     4:52 PM Pt here with worsening confusion according to wife.  Has been more confused x 2-3 months, admits to being depressed due to the decline health of his mentally handicapped son, who recently had 2 strokes.  He does appears to be forgetful, unable to recall current month, or current president.  His tandem gait is off but otherwise no focal neuro deficits.  Work up initiated.  Care discussed with Dr. Regenia Skeeter.    7:15 PM Labs are reassuring, no electrolytes imbalance, no source of infection, head CT scan without acute changes.  Appreciate consultation from neurologist, Dr. Cheral Marker, who recommend outpt work up for his condition. Return precaution discussed.     Domenic Moras, PA-C 03/06/17 Kathyrn Drown    Sherwood Gambler, MD 03/07/17 [phone]

## 2017-03-06 NOTE — Discharge Instructions (Signed)
Please follow up with your neurologist for further evaluation of your condition.  Return if you have any concerns.

## 2017-03-06 NOTE — ED Notes (Signed)
ED Provider at bedside. 

## 2017-03-06 NOTE — Addendum Note (Signed)
Addended by: Westley Hummer B on: 03/06/2017 06:12 PM   Modules accepted: Orders

## 2017-03-06 NOTE — ED Notes (Signed)
Dr. Regenia Skeeter made aware of pt symptoms

## 2017-03-06 NOTE — ED Notes (Signed)
Pt reports that he was working in the yard this morning and came in because he felt confused. Pt reports that sitting on the couch and communicating to his wife that he did not feel well. Pt already had a apt. With his PCP today so went ahead to his scheduled apt. Pt was sent here.

## 2017-03-06 NOTE — ED Notes (Signed)
Patient transported to CT 

## 2017-03-07 ENCOUNTER — Telehealth: Payer: Self-pay | Admitting: *Deleted

## 2017-03-07 NOTE — Telephone Encounter (Signed)
Pt's wife called back, she said their son has and appt with Dr [name] on 12/11. She is wanting to switch appt's, let this pt see Dr [name] instead. Pt is coming on 12/11 at 11:30.

## 2017-03-07 NOTE — Telephone Encounter (Signed)
Pt. last seen in by [facility] in July and was d/c from the stroke clinic,  to f/u with pcp to continue stroke risk reduction.  Wife sts. pt. has had periods of confusion over the last several months, getting worse.  Has seen pcp, also seen in the ER yesterday. CT neg for stroke.  She would like pt. seen in our office again to r/o CVA. I have provided reassurance, reviewed testing done in the ER yesterday, and instructions given.  Referred her back to pcp and explained he will need to send a new referral over if pt. needs to re-establish care in our office.  Very pleasant conversation.  Wife verbalized understanding of same, will f/u with pt's pcp/fim

## 2017-03-08 ENCOUNTER — Other Ambulatory Visit: Payer: Self-pay | Admitting: Family Medicine

## 2017-03-08 DIAGNOSIS — E1121 Type 2 diabetes mellitus with diabetic nephropathy: Secondary | ICD-10-CM

## 2017-03-08 NOTE — Telephone Encounter (Signed)
Ok to keep appt with Dr. [name], per MM/NP.

## 2017-03-12 ENCOUNTER — Ambulatory Visit: Payer: PPO | Admitting: Neurology

## 2017-03-13 ENCOUNTER — Telehealth: Payer: Self-pay | Admitting: Family Medicine

## 2017-03-13 NOTE — Telephone Encounter (Signed)
Increase Losartan to 100 mg daily and office follow up within 1-2 weeks to reassess.

## 2017-03-13 NOTE — Telephone Encounter (Signed)
Spoke with pt's wife. She states that at 02/20/17 OV you had recommended pt increase Losartan to 2 tabs daily and follow up in 2 weeks. When pt came in for follow up he was sent to ED via EMS so this was not discussed. They are needing a refill and would like to know if he should continue the 2 tabs daily.  Dr. Elease Hashimoto - Please advise. Thanks!

## 2017-03-13 NOTE — Telephone Encounter (Signed)
Copied from [employer]. Topic: General - Other >> Mar 13, 2017  3:32 PM Darl Householder, RMA wrote: Reason for CRM: Medication refill request Losartan 50 mg to be sent to [facility]

## 2017-03-14 ENCOUNTER — Ambulatory Visit: Payer: PPO | Admitting: Neurology

## 2017-03-14 ENCOUNTER — Encounter: Payer: Self-pay | Admitting: Neurology

## 2017-03-14 VITALS — BP 156/97 | HR 68 | Wt 164.8 lb

## 2017-03-14 DIAGNOSIS — I671 Cerebral aneurysm, nonruptured: Secondary | ICD-10-CM | POA: Diagnosis not present

## 2017-03-14 DIAGNOSIS — I672 Cerebral atherosclerosis: Secondary | ICD-10-CM

## 2017-03-14 DIAGNOSIS — I25810 Atherosclerosis of coronary artery bypass graft(s) without angina pectoris: Secondary | ICD-10-CM

## 2017-03-14 DIAGNOSIS — R410 Disorientation, unspecified: Secondary | ICD-10-CM

## 2017-03-14 DIAGNOSIS — I10 Essential (primary) hypertension: Secondary | ICD-10-CM | POA: Diagnosis not present

## 2017-03-14 DIAGNOSIS — E1121 Type 2 diabetes mellitus with diabetic nephropathy: Secondary | ICD-10-CM

## 2017-03-14 DIAGNOSIS — R413 Other amnesia: Secondary | ICD-10-CM | POA: Diagnosis not present

## 2017-03-14 DIAGNOSIS — E785 Hyperlipidemia, unspecified: Secondary | ICD-10-CM | POA: Diagnosis not present

## 2017-03-14 NOTE — Telephone Encounter (Signed)
LMTCB to advise pt's wife PEC Triage nurse may advise.

## 2017-03-14 NOTE — Patient Instructions (Signed)
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 - Follow up with your primary care physician for stroke risk factor modification. Recommend maintain blood pressure goal <140/80, diabetes with hemoglobin A1c goal below 7.0% and lipids with LDL cholesterol goal below 70 mg/dL.  - healthy diet and regular exercise - relax and de-stress and enough sleep  - follow up in 2 months.

## 2017-03-14 NOTE — Progress Notes (Addendum)
NEUROLOGY CLINIC FOLLOW UP NOTE  NAME: Miguel Vazquez DOB: October 13, 1940  HPI: Miguel Vazquez is a 76 y.o. 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   He was in his normal health until 01/06/16 he woke up with severe HA. His whole head hurt, 8/10 and was about to explode. Felt constant pain without throbbing feeling. Denies N/V, photophobia or phonophobia. Denies hx of migraine or HA. His BP in ER was very high at that time. He was admitted and BP controlled. His HA lasted about several hours and resolved. 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 He was then discharged with referral to Dr. Kathyrn Sheriff for aneurysm follow up. Dr. Kathyrn Sheriff saw pt and felt not candidate for intervention and referred to neurology for further evaluation.   He is on ASA and lipitor as home meds. He also takes metformin and glucose controlled well. hsi BP at home 130-140, today in clinic 141/85. Has quit smoking for 30 years, no alcohol or illicit drugs.   04/23/16 follow up - pt has been doing well. On ASA and lipitor without side effect. BP controlled well at home, today 121/82. Glucose at  Home 70s-130s. He did not check daily but 3 times a week. However, CUS was not done yet. No other complains. No HA.   10/22/16 follow up (CM) - Miguel Vazquez, 76 year old male returns for follow-up . Patient is currently on aspirin 325 daily with no stroke or TIA symptoms. He denies headache. Blood pressure in the office today 140/80. He remains on Lipitor without complaints of myalgias. He does little exercise. Carotid Doppler was negative for significant stenosis. His blood sugars typically run between 100- 150.   Interval history: During the interval time, patient has been doing well from a stroke standpoint.  However, for the last 2 months, his wife and daughter reported at least 2 episode of confusion.  First one was on  02/15/17, he went to car dealer for oil change, however, he was confused with his car key with others, and had difficult time with car dealer and other customers.  CT head negative at that time.  Second episode was on 03/06/17, he was confused, not able to  open car door, not able to answer orientation questions in doctor's office, was sent to ER for evaluation, gradually getting better in ER, and discharged after recovered in ER.  CBC, BMP were unremarkable, CT head negative, however TSH 7.75, and was started on Synthroid.   As per wife, for the last 2 months, he had abrupt change, seems more forgetful, take long road to places relatively nearby, sleeps a lot at home, lost keys, etc. As per patient and family, he and his wife have to take care of his handicapped and illed son at home, which interrupted his normal activity, he is not able to go out doing things as before. He sometimes not able to get good sleep at night too.    Past Medical History:  Diagnosis Date  . Arthritis   . Brain aneurysm 2018   followed by Dr [name]  . Cataract   . Coronary artery disease   . Diabetes mellitus   . GERD (gastroesophageal reflux disease)   . Hx of abdominal aortic aneurysm 1998  . Hyperlipidemia   . Hypertension   . Personal history of colonic polyps - adenomas 09/09/2013   Past Surgical History:  Procedure Laterality Date  .  ABDOMINAL AORTIC ANEURYSM REPAIR  1998  . CARDIAC CATHETERIZATION N/A 03/18/2015   Procedure: Left Heart Cath and Coronary Angiography;  Surgeon: Peter M Martinique, MD;  Location: [facility] [facility];  Service: Cardiovascular;  Laterality: N/A;  . cataract surgery Right   . COLONOSCOPY    . CORONARY ARTERY BYPASS GRAFT N/A 03/18/2015   Procedure: CORONARY ARTERY BYPASS GRAFTING (CABG) x  three, using left internal mammary artery and right leg greater saphenous vein harvested endoscopically;  Surgeon: Melrose Nakayama, MD;  Location: [facility];  Service: Open Heart Surgery;  Laterality:  N/A;  . INGUINAL HERNIA REPAIR Bilateral   . INTRAOPERATIVE TRANSESOPHAGEAL ECHOCARDIOGRAM N/A 03/18/2015   Procedure: INTRAOPERATIVE TRANSESOPHAGEAL ECHOCARDIOGRAM;  Surgeon: Melrose Nakayama, MD;  Location: [place] [facility];  Service: Open Heart Surgery;  Laterality: N/A;  . open heart surgery  2016  . UMBILICAL HERNIA REPAIR  1965   Family History  Problem Relation Age of Onset  . Bone cancer Mother   . Diabetes Paternal Aunt   . Stroke Son   . Down syndrome Son   . Colon cancer Neg Hx    Current Outpatient Medications  Medication Sig Dispense Refill  . aspirin EC 325 MG EC tablet Take 1 tablet (325 mg total) by mouth daily.    Marland Kitchen atorvastatin (LIPITOR) 80 MG tablet Take 1 tablet (80 mg total) by mouth daily at 6 PM. 90 tablet 0  . carvedilol (COREG) 6.25 MG tablet TAKE 1 TABLET (6.25 MG TOTAL) BY MOUTH 2 (TWO) TIMES DAILY. 180 tablet 1  . EPINEPHrine 0.3 mg/0.3 mL IJ SOAJ injection Inject 0.3 mLs (0.3 mg total) into the muscle as needed (tongue swelling). 1 Device 2  . glucose blood (FREESTYLE LITE) test strip Check Blood Sugars 1-2 times per day. DX: E11.9. 100 each 5  . Lancets (FREESTYLE) lancets Check Blood Sugars 1-2 times per day. DX: E11.9 100 each 5  . levothyroxine (SYNTHROID, LEVOTHROID) 25 MCG tablet Take 1 tablet (25 mcg total) by mouth daily before breakfast. 30 tablet 5  . losartan (COZAAR) 50 MG tablet TAKE 1 TABLET BY MOUTH EVERY DAY (Patient taking differently: TAKE 2 TABLET(100mg ) BY MOUTH EVERY DAY) 30 tablet 1  . metFORMIN (GLUCOPHAGE) 1000 MG tablet TAKE 1 TABLET BY MOUTH TWICE A DAY 180 tablet 3  . Multiple Vitamin (MULTIVITAMIN) tablet Take 1 tablet by mouth daily. Centrium Silver once daily     . vitamin C (ASCORBIC ACID) 500 MG tablet Take 500 mg by mouth daily.     No current facility-administered medications for this visit.    Allergies  Allergen Reactions  . Food Anaphylaxis    TREE NUTS  . Shellfish Allergy Anaphylaxis  . Other Other (See Comments)     Powdered eggs- swelling, widespread rash  . Prednisone     "psychosis"  . Venomil Honey Bee Venom [Honey Bee Venom] Swelling    Localized swelling   Social History   Socioeconomic History  . Marital status: Married    Spouse name: [name]  . Number of children: 3  . Years of education: Not on file  . Highest education level: Not on file  Social Needs  . Financial resource strain: Not on file  . Food insecurity - worry: Not on file  . Food insecurity - inability: Not on file  . Transportation needs - medical: Not on file  . Transportation needs - non-medical: Not on file  Occupational History  . Not on file  Tobacco Use  .  Smoking status: Former Smoker    Packs/day: 1.50    Years: 20.00    Pack years: 30.00    Types: Cigarettes    Last attempt to quit: 08/24/1986    Years since quitting: 30.5  . Smokeless tobacco: Never Used  Substance and Sexual Activity  . Alcohol use: Yes    Comment: occ  . Drug use: No  . Sexual activity: Not on file  Other Topics Concern  . Not on file  Social History Narrative  . Not on file    Review of Systems Full 14 system review of systems performed and notable only for those listed, all others are neg:  Constitutional: Activity change Cardiovascular:  Ear/Nose/Throat:   Skin:  Eyes:   Respiratory:   Gastroitestinal:   Genitourinary:  Hematology/Lymphatic:   Endocrine:  Musculoskeletal:  Allergy/Immunology:   Neurological: Memory loss Psychiatric: Confusion, decreased concentration, depression, nervous/anxious Sleep: Daytime sleepiness, snoring   Physical Exam  Vitals:   03/14/17 1107  BP: (!) 156/97  Pulse: 68    General - Well nourished, well developed, in no apparent distress.  Ophthalmologic - Sharp disc margins OU.  Cardiovascular - Regular rate and rhythm with no murmur.    Neck - supple, no nuchal rigidity .  Mental Status -  Level of arousal and orientation to time, place, and person were intact. Language  including expression, naming, repetition, comprehension, reading, and writing was assessed and found intact. Fund of Knowledge was assessed and was intact.  Cranial Nerves II - XII - II - Visual field intact OU. III, IV, VI - Extraocular movements intact. V - Facial sensation intact bilaterally. VII - Facial movement intact bilaterally. VIII - Hearing & vestibular intact bilaterally. X - Palate elevates symmetrically. XI - Chin turning & shoulder shrug intact bilaterally. XII - Tongue protrusion intact.  Motor Strength - The patient's strength was normal in all extremities and pronator drift was absent.  Bulk was normal and fasciculations were absent.   Motor Tone - Muscle tone was assessed at the neck and appendages and was normal.  Reflexes - The patient's reflexes were normal in all extremities and he had no pathological reflexes.  Sensory - Light touch, temperature/pinprick, vibration and proprioception, and Romberg testing were assessed and were normal.    Coordination - The patient had normal movements in the hands and feet with no ataxia or dysmetria.  Tremor was absent.  Gait and Station - The patient's transfers, posture, gait, station, and turns were observed as normal.   Imaging  I have personally reviewed the radiological images below and agree with the radiology interpretations.  Ct Angio Head W Or Wo Contrast 01/06/2016 IMPRESSION: 1. No acute intracranial abnormality. Negative for intracranial saccular aneurysm or large vessel occlusion. 2. 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 3. Extensive calcified intracranial atherosclerosis. Hemodynamically significant (moderate) appearing stenoses of the right ICA siphon (cavernous and supraclinoid segments) and proximal basilar artery. 4. Moderately advanced cerebral white matter and deep gray  matter nuclei heterogeneity which likely reflects chronic small vessel disease.   Ct Head Wo Contrast 03/06/2017 IMPRESSION: 1. No acute finding or change from prior. 2. Moderate atrophy and chronic small vessel ischemia. 3. Atherosclerosis and vertebrobasilar tortuosity.   Lab Review Component     Latest Ref Rng & Units 07/15/2015 01/13/2016  Cholesterol     125 - 200 mg/dL 141   Triglycerides     <  150 mg/dL 113   HDL Cholesterol     >=40 mg/dL 43   Total CHOL/HDL Ratio     <=5.0 Ratio 3.3   VLDL     <30 mg/dL 23   LDL (calc)     <130 mg/dL 75   Hemoglobin A1C     <5.7 %  7.4 (H)  Mean Plasma Glucose     mg/dL  166     Component     Latest Ref Rng & Units 02/08/2017  TSH     0.35 - 4.50 uIU/mL 7.75 (H)    Assessment:   In summary, Miguel Vazquez is a 76 y.o. male with PMH of CAD, DM, HLD, and HTN who presents as a new patient for intracranial atherosclerosis. Developed severe HA on 01/06/16 likely due to significant high BP. 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 He was referred to neurology for further evaluation. A1C 7.4, LDL 75. Has quit smoking for 30 years.  He was on ASA and lipitor as home meds, but increased to full dose ASA and lipitor 80mg . BP and DM meds also adjusted and currently BP and glucose stable. CUS unremarkable.   However, for the last 2-3 months, pt seems more forgetful and sleeps a lot in the setting of taking care of handicapped and illed son, lack of previous activities. He had 2 episodes of confusion and disorientation, not sure etiology. Given risk factors, will do MRI and MRA. Also EEG to rule out seizure, check B12 and repeat LDL, A1C and TSH. If all unremarkable, may consider stress/depression related brain reserve decrease vs. Cognitive decline.   Plan: - continue ASA and lipitor for intracranial stenosis - check BP and glucose at home - with do EEG, and MRI to rule out seizure and  stroke - will draw blood to further work up - will do [place] test next visit. - Follow up with your primary care physician for stroke risk factor modification. Recommend maintain blood pressure goal <140/80, diabetes with hemoglobin A1c goal below 7.0% and lipids with LDL cholesterol goal below 70 mg/dL.  - healthy diet and regular exercise - relax and de-stress and enough sleep  - follow up in 2 months.   I spent more than 25 minutes of face to face time with the patient. Greater than 50% of time was spent in counseling and coordination of care. We discussed etiology of confusion and further work up.   Orders Placed This Encounter  Procedures  . MR BRAIN WO CONTRAST    Standing Status:   Future    Standing Expiration Date:   05/15/2018    Order Specific Question:   What is the patient's sedation requirement?    Answer:   No Sedation    Order Specific Question:   Does the patient have a pacemaker or implanted devices?    Answer:   No    Order Specific Question:   Preferred imaging location?    Answer:   Internal    Order Specific Question:   Radiology Contrast Protocol - do NOT remove file path    Answer:   [email or web address]  . MR MRA HEAD WO CONTRAST    Standing Status:   Future    Standing Expiration Date:   05/15/2018    Order Specific Question:   What is the patient's sedation requirement?    Answer:   No Sedation    Order Specific Question:   Does  the patient have a pacemaker or implanted devices?    Answer:   No    Order Specific Question:   Preferred imaging location?    Answer:   Internal    Order Specific Question:   Radiology Contrast Protocol - do NOT remove file path    Answer:   [email or web address]  . Hemoglobin A1c  . Lipid panel  . Vitamin B12  . TSH + free T4  . EEG adult    Standing Status:   Future    Standing Expiration Date:   03/14/2018    No orders of the defined types were placed in this  encounter.   Patient Instructions  - continue ASA and lipitor for intracranial stenosis - check BP and glucose at home - with do EEG, and MRI to rule out seizure and stroke - will draw blood to further work up - will do [place] test next visit. - Follow up with your primary care physician for stroke risk factor modification. Recommend maintain blood pressure goal <140/80, diabetes with hemoglobin A1c goal below 7.0% and lipids with LDL cholesterol goal below 70 mg/dL.  - healthy diet and regular exercise - relax and de-stress and enough sleep  - follow up in 2 months.    Rosalin Hawking, MD PhD [facility] [address] [place], [place] [address] [phone]

## 2017-03-15 ENCOUNTER — Telehealth: Payer: Self-pay | Admitting: Family Medicine

## 2017-03-15 LAB — LIPID PANEL
CHOLESTEROL TOTAL: 116 mg/dL (ref 100–199)
Chol/HDL Ratio: 2.6 ratio (ref 0.0–5.0)
HDL: 45 mg/dL (ref >39)
LDL CALC: 57 mg/dL (ref 0–99)
TRIGLYCERIDES: 71 mg/dL (ref 0–149)
VLDL CHOLESTEROL CAL: 14 mg/dL (ref 5–40)

## 2017-03-15 LAB — VITAMIN B12: Vitamin B-12: 336 pg/mL (ref 232–1245)

## 2017-03-15 LAB — TSH+FREE T4
Free T4: 1.31 ng/dL (ref 0.82–1.77)
TSH: 5.15 u[IU]/mL — ABNORMAL HIGH (ref 0.450–4.500)

## 2017-03-15 LAB — HEMOGLOBIN A1C
Est. average glucose Bld gHb Est-mCnc: 131 mg/dL
Hgb A1c MFr Bld: 6.2 % — ABNORMAL HIGH (ref 4.8–5.6)

## 2017-03-15 MED ORDER — LOSARTAN POTASSIUM 100 MG PO TABS: 100.0000 mg | ORAL_TABLET | Freq: Every day | ORAL | 3 refills | Status: DC

## 2017-03-15 NOTE — Telephone Encounter (Signed)
Pt   Wife called   Stating   Mr Riling   Is  Out  Oh  Mis  bp   Medication   And  Needs  A    New  Prescription    Written    Naoma Diener  Notified   She  Read  The   Encounters   And  Will   Proceed .  Appt  Was  Made  For the  Patient  As   Well .

## 2017-03-15 NOTE — Telephone Encounter (Signed)
This is a duplicate note, see previous note.

## 2017-03-15 NOTE — Telephone Encounter (Signed)
Rx sent and wife is aware 

## 2017-03-15 NOTE — Telephone Encounter (Signed)
Pt wife calling back to request refill.

## 2017-03-19 ENCOUNTER — Telehealth: Payer: Self-pay

## 2017-03-19 NOTE — Telephone Encounter (Signed)
-----   Message from Rosalin Hawking, MD sent at 03/15/2017  3:04 PM EST ----- Could you please let the patient know that the blood test done recently in our office was largely unremarkable. Cholesterol and glucose controlled well. B12 normal at low end, may consider to take B12 tablet 1000 microgram daily. Pt can buy from OTC. Thyroid function improved and no concern at this time. Will see what MRI and EEG show. Please continue current treatment. Thanks.  Rosalin Hawking, MD PhD Stroke Neurology 03/15/2017 3:04 PM

## 2017-03-19 NOTE — Telephone Encounter (Signed)
Notes recorded by Marval Regal, RN on 03/19/2017 at 1:15 PM EST Rn spoke with patients wife on dpr. Rn stated pts b12 on the low end. Consider b12 tablet 1087mcg daily. PT can buy OTC. Thyroid level improve. Other labs unremarkable. Pts wife write this down and was told its on mychart. Also she wrote down about the b12 otc to take. Pts wife verbalized understanding. ------

## 2017-03-20 ENCOUNTER — Telehealth: Payer: Self-pay

## 2017-03-20 ENCOUNTER — Ambulatory Visit: Payer: PPO | Admitting: Neurology

## 2017-03-20 DIAGNOSIS — R410 Disorientation, unspecified: Secondary | ICD-10-CM

## 2017-03-20 DIAGNOSIS — R413 Other amnesia: Secondary | ICD-10-CM

## 2017-03-20 NOTE — Telephone Encounter (Signed)
Notes recorded by Marval Regal, RN on 03/20/2017 at 5:41 PM EST Left vm for patients wife to give her pts eeg results.

## 2017-03-20 NOTE — Telephone Encounter (Signed)
-----   Message from Rosalin Hawking, MD sent at 03/20/2017  5:03 PM EST ----- Could you please let the patient know that the EEG test done recently in our office was negative for seizure. Please continue current treatment. Thanks.  Rosalin Hawking, MD PhD Stroke Neurology 03/20/2017 5:03 PM

## 2017-03-20 NOTE — Procedures (Signed)
     History: [name] is a 76 year old gentleman with a history of cerebrovascular disease.  Within the last 2 months he has had 2 episodes of confusion, he is also been more forgetful recently, unable to perform tasks that he has been able to do before.  The patient is being evaluated for these mental status changes.  This is a routine EEG.  No skull defects are noted.  Medications include aspirin, Lipitor, Coreg, Synthroid, Cozaar, metformin, multivitamins, and vitamin C.  EEG classification: Dysrhythmia grade 1 generalized  Description of the recording: The background rhythms of this recording consists of a fairly well modulated medium amplitude 7 Hz theta frequency activity that is reactive to eye-opening closure.  As the record progresses, the patient appears to remain in the waking state.  Hyperventilation was not performed, photic stimulation was performed and resulted in a bilateral and symmetric photic driving response.  At no time during the recording does there appear to be evidence of spike or spike-wave discharges or evidence of focal slowing.  Mild diffuse slowing was seen throughout the recording.  EKG monitor shows no evidence of cardiac rhythm abnormalities with a heart rate of 72.  Impression: This is a mildly abnormal EEG recording secondary to slight background slowing seen throughout the recording.  This is a nonspecific finding, and can be seen in any process that results in a mild toxic or metabolic encephalopathy or any dementing type illness.  No evidence of epileptiform discharges were seen.

## 2017-03-21 NOTE — Telephone Encounter (Signed)
Notes recorded by Marval Regal, RN on 03/21/2017 at 10:08 AM EST Rn call patients wife that the EEG was negative for any seizure. Pts wife on dpr verbalized understanding.

## 2017-03-21 NOTE — Telephone Encounter (Signed)
-----   Message from Rosalin Hawking, MD sent at 03/20/2017  5:03 PM EST ----- Could you please let the patient know that the EEG test done recently in our office was negative for seizure. Please continue current treatment. Thanks.  Rosalin Hawking, MD PhD Stroke Neurology 03/20/2017 5:03 PM

## 2017-03-22 ENCOUNTER — Ambulatory Visit: Payer: PPO | Admitting: Family Medicine

## 2017-03-27 ENCOUNTER — Ambulatory Visit
Admission: RE | Admit: 2017-03-27 | Discharge: 2017-03-27 | Disposition: A | Discharge status: Home / Self Care | Payer: PPO | Source: Ambulatory Visit | Attending: Neurology | Admitting: Neurology

## 2017-03-27 DIAGNOSIS — I671 Cerebral aneurysm, nonruptured: Secondary | ICD-10-CM

## 2017-03-27 DIAGNOSIS — I725 Aneurysm of other precerebral arteries: Secondary | ICD-10-CM | POA: Diagnosis not present

## 2017-03-27 DIAGNOSIS — R413 Other amnesia: Secondary | ICD-10-CM | POA: Diagnosis not present

## 2017-03-28 ENCOUNTER — Encounter: Payer: Self-pay | Admitting: Family Medicine

## 2017-03-28 ENCOUNTER — Ambulatory Visit: Payer: PPO | Admitting: Family Medicine

## 2017-03-28 VITALS — BP 120/80 | HR 72 | Temp 97.3°F | Ht 68.0 in | Wt 162.8 lb

## 2017-03-28 DIAGNOSIS — E785 Hyperlipidemia, unspecified: Secondary | ICD-10-CM | POA: Diagnosis not present

## 2017-03-28 DIAGNOSIS — I672 Cerebral atherosclerosis: Secondary | ICD-10-CM | POA: Diagnosis not present

## 2017-03-28 DIAGNOSIS — E1121 Type 2 diabetes mellitus with diabetic nephropathy: Secondary | ICD-10-CM | POA: Diagnosis not present

## 2017-03-28 DIAGNOSIS — R410 Disorientation, unspecified: Secondary | ICD-10-CM

## 2017-03-28 DIAGNOSIS — I10 Essential (primary) hypertension: Secondary | ICD-10-CM | POA: Diagnosis not present

## 2017-03-28 NOTE — Progress Notes (Signed)
Subjective:     Patient ID: Miguel Vazquez, male   DOB: 1940-11-19, 76 y.o.   MRN: [id number]  HPI Patient seen with episodes of recent acute confusion. Wife states he's had some changes in terms increased irritability and memory deficits at baseline but has definitely been worse episodically. He has seen by neurology and had recent EEG which showed no definite epilepsy activity. He had MRI of the brain and MR angiogram which are still pending.  Denies any headaches. Overall, appears to be more stable this time. He had multiple labs recently per urology. B12 and thyroid were normal. A1c 6.2%. Lipids well controlled.  Hypertension with elevated reading last visit. He takes carvedilol along with losartan. We increased his losartan recently from 50 to 100 mg and blood pressure much improved today. No recent dizziness or chest pains.  Past Medical History:  Diagnosis Date  . Arthritis   . Brain aneurysm 2018   followed by Dr [name]  . Cataract   . Coronary artery disease   . Diabetes mellitus   . GERD (gastroesophageal reflux disease)   . Hx of abdominal aortic aneurysm 1998  . Hyperlipidemia   . Hypertension   . Personal history of colonic polyps - adenomas 09/09/2013   Past Surgical History:  Procedure Laterality Date  . ABDOMINAL AORTIC ANEURYSM REPAIR  1998  . CARDIAC CATHETERIZATION N/A 03/18/2015   Procedure: Left Heart Cath and Coronary Angiography;  Surgeon: Peter M Martinique, MD;  Location: [place] [facility];  Service: Cardiovascular;  Laterality: N/A;  . cataract surgery Right   . COLONOSCOPY    . CORONARY ARTERY BYPASS GRAFT N/A 03/18/2015   Procedure: CORONARY ARTERY BYPASS GRAFTING (CABG) x  three, using left internal mammary artery and right leg greater saphenous vein harvested endoscopically;  Surgeon: Melrose Nakayama, MD;  Location: [place];  Service: Open Heart Surgery;  Laterality: N/A;  . INGUINAL HERNIA REPAIR Bilateral   . INTRAOPERATIVE TRANSESOPHAGEAL ECHOCARDIOGRAM  N/A 03/18/2015   Procedure: INTRAOPERATIVE TRANSESOPHAGEAL ECHOCARDIOGRAM;  Surgeon: Melrose Nakayama, MD;  Location: [place];  Service: Open Heart Surgery;  Laterality: N/A;  . open heart surgery  2016  . [place]    reports that he quit smoking about 30 years ago. His smoking use included cigarettes. He has a 30.00 pack-year smoking history. he has never used smokeless tobacco. He reports that he drinks alcohol. He reports that he does not use drugs. family history includes Bone cancer in his mother; Diabetes in his paternal aunt; Down syndrome in his son; Stroke in his son. Allergies  Allergen Reactions  . Food Anaphylaxis    TREE NUTS  . Shellfish Allergy Anaphylaxis  . Other Other (See Comments)    Powdered eggs- swelling, widespread rash  . Prednisone     "psychosis"  . Venomil Honey Bee Venom [Honey Bee Venom] Swelling    Localized swelling     Review of Systems  Constitutional: Negative for appetite change, fatigue and unexpected weight change.  Eyes: Negative for visual disturbance.  Respiratory: Negative for cough, chest tightness and shortness of breath.   Cardiovascular: Negative for chest pain, palpitations and leg swelling.  Endocrine: Negative for polydipsia and polyuria.  Neurological: Negative for dizziness, syncope, weakness, light-headedness and headaches.       Objective:   Physical Exam  Constitutional: He is oriented to person, place, and time. He appears well-developed and well-nourished.  HENT:  Right Ear: External ear normal.  Left Ear: External  ear normal.  Mouth/Throat: Oropharynx is clear and moist.  Eyes: Pupils are equal, round, and reactive to light.  Neck: Neck supple. No thyromegaly present.  Cardiovascular: Normal rate and regular rhythm.  Pulmonary/Chest: Effort normal and breath sounds normal. No respiratory distress. He has no wheezes. He has no rales.  Musculoskeletal: He exhibits no edema.  Neurological: He is  alert and oriented to person, place, and time. No cranial nerve deficit.       Assessment:     #1 episodic confusion. Neurology evaluation in progress  #2 hypertension stable and at goal  #3 history of dyslipidemia with recent lipids at goal  #4 type 2 diabetes well controlled with recent A1c 6.2%    Plan:     -Continue close follow-up with neurology -Keep follow-up in March. Reassess A1c then. -Follow-up immediately for any acute confusion or other concerns  Eulas Post MD [facility]

## 2017-04-03 ENCOUNTER — Telehealth: Payer: Self-pay | Admitting: Neurology

## 2017-04-03 ENCOUNTER — Telehealth: Payer: Self-pay | Admitting: *Deleted

## 2017-04-03 NOTE — Telephone Encounter (Signed)
LVM requesting call back re: MRI results. 

## 2017-04-03 NOTE — Telephone Encounter (Signed)
I called pt daughter, pt wife and pt himself delivered MRI, MRA, EEG and lab results to them. So far everything looks good, his episode of confusion most likely due to mild cognitive impairment in the setting of fatigue, tiredness and anxiety. He has to taking care of his illed son at night from time to time and not able to get good sleep. He also worries about son's future. I told them to keep adequate rest, avoid infection, keep relaxed and good sleep. They expressed understanding and appreciation. I will see him on 06/04/17 as scheduled.   Rosalin Hawking, MD PhD Stroke Neurology 04/03/2017 4:28 PM

## 2017-04-03 NOTE — Telephone Encounter (Signed)
Pts daughter called wanting to talk to Dr. [name] about her fathers MRI and MRA. She is aware that Dr [name] is at the hospital this week. please call to discuss fat

## 2017-04-08 ENCOUNTER — Other Ambulatory Visit: Payer: Self-pay

## 2017-04-08 DIAGNOSIS — E785 Hyperlipidemia, unspecified: Secondary | ICD-10-CM

## 2017-04-08 MED ORDER — ATORVASTATIN CALCIUM 80 MG PO TABS: 80.0000 mg | ORAL_TABLET | Freq: Every day | ORAL | 0 refills | Status: DC

## 2017-05-23 ENCOUNTER — Other Ambulatory Visit: Payer: Self-pay

## 2017-05-23 MED ORDER — CARVEDILOL 6.25 MG PO TABS: 6.2500 mg | ORAL_TABLET | Freq: Two times a day (BID) | ORAL | 1 refills | Status: DC

## 2017-05-23 NOTE — Telephone Encounter (Signed)
Spoke to patient's wife.She stated she did not realize husband has not seen Dr.Jordan since 12/2015.Stated their son had a stroke and they have been taking care of him.Appointment scheduled with Dr.Jordan 06/07/17 at 1:40 pm.Coreg refill sent to pharmacy.

## 2017-05-24 ENCOUNTER — Other Ambulatory Visit: Payer: Self-pay

## 2017-06-04 ENCOUNTER — Ambulatory Visit: Payer: PPO | Admitting: Neurology

## 2017-06-05 ENCOUNTER — Encounter: Payer: Self-pay | Admitting: Neurology

## 2017-06-06 NOTE — Progress Notes (Signed)
06/07/2017 RENNER SEBALD   27-Jun-1940  [id number]  Primary Physician [name], [name], MD Primary Cardiologist: Dr Peter Martinique  HPI:  77 y/o male seen for follow up CAD. He has a history of DM, HTN, CRI, and PVD s/p remote AAA R&G, admitted 03/18/15 with a STEMI. At cath he had severe 3V and left main CAD. EF 20--25%. He went for urgent CABG. He tolerated this well. Post op he had PAF and was placed on Amiodarone. Amiodarone has since been discontinued. Echo in January 2017showed EF 35-40%. He was placed on lisinopril but developed hyperkalemia so this was stopped. On his follow up we resumed at a lower dose and potassium levels have been normal.   On follow up today he is doing very well. He denies any chest pain, dyspnea, palpitations, edema. 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 No bleeding.    Current Outpatient Medications  Medication Sig Dispense Refill  . atorvastatin (LIPITOR) 80 MG tablet Take 1 tablet (80 mg total) by mouth daily at 6 PM. 90 tablet 0  . carvedilol (COREG) 6.25 MG tablet Take 1 tablet (6.25 mg total) by mouth 2 (two) times daily. 60 tablet 1  . EPINEPHrine 0.3 mg/0.3 mL IJ SOAJ injection Inject 0.3 mLs (0.3 mg total) into the muscle as needed (tongue swelling). 1 Device 2  . glucose blood (FREESTYLE LITE) test strip Check Blood Sugars 1-2 times per day. DX: E11.9. 100 each 5  . Lancets (FREESTYLE) lancets Check Blood Sugars 1-2 times per day. DX: E11.9 100 each 5  . levothyroxine (SYNTHROID, LEVOTHROID) 25 MCG tablet Take 1 tablet (25 mcg total) by mouth daily before breakfast. 30 tablet 5  . losartan (COZAAR) 100 MG tablet Take 1 tablet (100 mg total) by mouth daily. 90 tablet 3  . metFORMIN (GLUCOPHAGE) 1000 MG tablet TAKE 1 TABLET BY MOUTH TWICE A DAY 180 tablet 3  . Multiple Vitamin (MULTIVITAMIN) tablet Take 1 tablet by mouth daily. Centrium Silver once daily     . vitamin B-12 (CYANOCOBALAMIN) 1000 MCG tablet Take 1,000 mcg by  mouth daily.    . vitamin C (ASCORBIC ACID) 500 MG tablet Take 500 mg by mouth daily.    Marland Kitchen aspirin EC 81 MG tablet Take 1 tablet (81 mg total) by mouth daily. 90 tablet 3   No current facility-administered medications for this visit.     Allergies  Allergen Reactions  . Food Anaphylaxis    TREE NUTS  . Shellfish Allergy Anaphylaxis  . Other Other (See Comments)    Powdered eggs- swelling, widespread rash  . Prednisone     "psychosis"  . Venomil Honey Bee Venom [Honey Bee Venom] Swelling    Localized swelling    Social History   Socioeconomic History  . Marital status: Married    Spouse name: [name]  . Number of children: 3  . Years of education: Not on file  . Highest education level: Not on file  Social Needs  . Financial resource strain: Not on file  . Food insecurity - worry: Not on file  . Food insecurity - inability: Not on file  . Transportation needs - medical: Not on file  . Transportation needs - non-medical: Not on file  Occupational History  . Not on file  Tobacco Use  . Smoking status: Former Smoker    Packs/day: 1.50    Years: 20.00    Pack years: 30.00    Types: Cigarettes  Last attempt to quit: 08/24/1986    Years since quitting: 30.8  . Smokeless tobacco: Never Used  Substance and Sexual Activity  . Alcohol use: Yes    Comment: occ  . Drug use: No  . Sexual activity: Not on file  Other Topics Concern  . Not on file  Social History Narrative  . Not on file     Review of Systems: As noted in HPI All other systems reviewed and are otherwise negative except as noted above.    Blood pressure 124/76, pulse 66, height 5\' 8"  (1.727 m), weight 166 lb (75.3 kg).  GENERAL:  Well appearing HEENT:  PERRL, EOMI, sclera are clear. Oropharynx is clear. NECK:  No jugular venous distention, carotid upstroke brisk and symmetric, no bruits, no thyromegaly or adenopathy LUNGS:  Clear to auscultation bilaterally CHEST:  Unremarkable HEART:  RRR,  PMI not  displaced or sustained,S1 and S2 within normal limits, no S3, no S4: no clicks, no rubs, soft 1/6 systolic murmur LSB ABD:  Soft, nontender. BS +, no masses or bruits. No hepatomegaly, no splenomegaly EXT:  2 + pulses throughout, no edema, no cyanosis no clubbing SKIN:  Warm and dry.  No rashes NEURO:  Alert and oriented x 3. Cranial nerves II through XII intact. PSYCH:  Cognitively intact      EKG not done today.  Lab Results  Component Value Date   WBC 7.4 03/06/2017   HGB 14.0 03/06/2017   HCT 40.7 03/06/2017   PLT 128 (L) 03/06/2017   GLUCOSE 75 03/06/2017   CHOL 116 03/14/2017   TRIG 71 03/14/2017   HDL 45 03/14/2017   LDLDIRECT 84.1 05/21/2013   LDLCALC 57 03/14/2017   ALT 17 03/06/2017   AST 27 03/06/2017   NA 140 03/06/2017   K 4.0 03/06/2017   CL 103 03/06/2017   CREATININE 0.97 03/06/2017   BUN 18 03/06/2017   CO2 25 03/06/2017   TSH 5.150 (H) 03/14/2017   INR 1.63 (H) 03/18/2015   HGBA1C 6.2 (H) 03/14/2017   MICROALBUR 6.8 (H) 11/21/2009   Echo: 12/13/15: Study Conclusions  - Left ventricle: The cavity size was normal. Wall thickness was   increased in a pattern of mild LVH. Systolic function was mildly   to moderately reduced. The estimated ejection fraction was in the   range of 40% to 45%. Diffuse hypokinesis. There is dyskinesis of   the basalinferolateral myocardium. Doppler parameters are   consistent with abnormal left ventricular relaxation (grade 1   diastolic dysfunction). - Mitral valve: Calcified annulus. - Left atrium: The atrium was mildly dilated. - Pulmonary arteries: Systolic pressure was mildly increased. PA   peak pressure: 32 mm Hg (S).  Impressions:  - Global hypokinesis and dyskinesis of the basal inferior lateral   wall; overall mild to moderate LV dysfunction; grade 1 diastolic   dysfunction; mild LAE; mild TR with mildly elevated pulmonary   pressure.   ASSESSMENT AND PLAN:  1. CAD s/p STEMI in December 2016. Urgent  CABG for severe 3 vessel and left main disease. He is asymptomatic. Recommend reduce  ASA to 81 mg daily, continue beta blocker, and statin.  2. Ischemic cardiomyopathy. EF improved to 40-45%. Well compensated. Will continue Coreg  6.25 mg bid and losartan.   3. Hyperlipidemia. On lipitor. Excellent control.  4. Post op Afib. Resolved. No recurrence  5. DM type 2 with renal manifestations. Last A1c 6.2%. 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 S/p AAA repair.  I will follow up in one year.    Peter Martinique MD,FACC  06/07/2017 2:22 PM

## 2017-06-07 ENCOUNTER — Encounter: Payer: Self-pay | Admitting: Cardiology

## 2017-06-07 ENCOUNTER — Ambulatory Visit: Payer: PPO | Admitting: Cardiology

## 2017-06-07 VITALS — BP 124/76 | HR 66 | Ht 68.0 in | Wt 166.0 lb

## 2017-06-07 DIAGNOSIS — I25810 Atherosclerosis of coronary artery bypass graft(s) without angina pectoris: Secondary | ICD-10-CM | POA: Diagnosis not present

## 2017-06-07 DIAGNOSIS — E7800 Pure hypercholesterolemia, unspecified: Secondary | ICD-10-CM | POA: Diagnosis not present

## 2017-06-07 DIAGNOSIS — Z951 Presence of aortocoronary bypass graft: Secondary | ICD-10-CM

## 2017-06-07 DIAGNOSIS — I10 Essential (primary) hypertension: Secondary | ICD-10-CM | POA: Diagnosis not present

## 2017-06-07 MED ORDER — ASPIRIN EC 81 MG PO TBEC: 81.0000 mg | DELAYED_RELEASE_TABLET | Freq: Every day | ORAL | 3 refills | Status: DC

## 2017-06-07 NOTE — Patient Instructions (Addendum)
Reduce ASA to 81 mg daily  Continue your other therapy  I will see you in one year 

## 2017-06-10 ENCOUNTER — Other Ambulatory Visit: Payer: Self-pay | Admitting: *Deleted

## 2017-06-10 DIAGNOSIS — E1121 Type 2 diabetes mellitus with diabetic nephropathy: Secondary | ICD-10-CM

## 2017-06-10 MED ORDER — METFORMIN HCL 1000 MG PO TABS: 1000.0000 mg | ORAL_TABLET | Freq: Two times a day (BID) | ORAL | 3 refills | Status: DC

## 2017-06-11 ENCOUNTER — Encounter: Payer: Self-pay | Admitting: Family Medicine

## 2017-06-11 ENCOUNTER — Ambulatory Visit (INDEPENDENT_AMBULATORY_CARE_PROVIDER_SITE_OTHER): Payer: PPO | Admitting: Family Medicine

## 2017-06-11 ENCOUNTER — Other Ambulatory Visit: Payer: Self-pay | Admitting: Family Medicine

## 2017-06-11 VITALS — BP 138/78 | HR 68 | Temp 97.6°F | Wt 166.1 lb

## 2017-06-11 DIAGNOSIS — I10 Essential (primary) hypertension: Secondary | ICD-10-CM | POA: Diagnosis not present

## 2017-06-11 DIAGNOSIS — R3915 Urgency of urination: Secondary | ICD-10-CM | POA: Diagnosis not present

## 2017-06-11 DIAGNOSIS — E1121 Type 2 diabetes mellitus with diabetic nephropathy: Secondary | ICD-10-CM | POA: Diagnosis not present

## 2017-06-11 DIAGNOSIS — E7800 Pure hypercholesterolemia, unspecified: Secondary | ICD-10-CM

## 2017-06-11 LAB — POCT GLYCOSYLATED HEMOGLOBIN (HGB A1C): HEMOGLOBIN A1C: 6.5

## 2017-06-11 NOTE — Progress Notes (Signed)
Subjective:     Patient ID: Miguel Vazquez, male   DOB: September 25, 1940, 77 y.o.   MRN: [id number]  HPI Patient here for medical follow-up. He saw cardiology last week. They reduced his aspirin 81 mg daily. He's had no recent chest pains. He had ST elevation MI back in December 2016. He's had previous coronary artery bypass graft. He has type 2 diabetes which has been well controlled. A1c today 6.5%. Does not monitor sugars daily he does have some daytime urinary urgency but no nocturia. No obstructive urinary symptoms. Drink about 1 cup of coffee a day and occasional tea.  Hyperlipidemia treated with atorvastatin. His blood pressures been stable with combination therapy of carvedilol and losartan. No headaches. No dizziness. Lipids are checked in December with LDL cholesterol 57 and total cholesterol 116   Past Medical History:  Diagnosis Date  . Arthritis   . Brain aneurysm 2018   followed by Dr [name]  . Cataract   . Coronary artery disease   . Diabetes mellitus   . GERD (gastroesophageal reflux disease)   . Hx of abdominal aortic aneurysm 1998  . Hyperlipidemia   . Hypertension   . Personal history of colonic polyps - adenomas 09/09/2013   Past Surgical History:  Procedure Laterality Date  . ABDOMINAL AORTIC ANEURYSM REPAIR  1998  . CARDIAC CATHETERIZATION N/A 03/18/2015   Procedure: Left Heart Cath and Coronary Angiography;  Surgeon: Peter M Martinique, MD;  Location: [place] [facility] [facility];  Service: Cardiovascular;  Laterality: N/A;  . cataract surgery Right   . COLONOSCOPY    . CORONARY ARTERY BYPASS GRAFT N/A 03/18/2015   Procedure: CORONARY ARTERY BYPASS GRAFTING (CABG) x  three, using left internal mammary artery and right leg greater saphenous vein harvested endoscopically;  Surgeon: Melrose Nakayama, MD;  Location: [facility];  Service: Open Heart Surgery;  Laterality: N/A;  . INGUINAL HERNIA REPAIR Bilateral   . INTRAOPERATIVE TRANSESOPHAGEAL ECHOCARDIOGRAM N/A 03/18/2015   Procedure:  INTRAOPERATIVE TRANSESOPHAGEAL ECHOCARDIOGRAM;  Surgeon: Melrose Nakayama, MD;  Location: [facility];  Service: Open Heart Surgery;  Laterality: N/A;  . open heart surgery  2016  . [place]    reports that he quit smoking about 30 years ago. His smoking use included cigarettes. He has a 30.00 pack-year smoking history. he has never used smokeless tobacco. He reports that he drinks alcohol. He reports that he does not use drugs. family history includes Bone cancer in his mother; Diabetes in his paternal aunt; Down syndrome in his son; Stroke in his son. Allergies  Allergen Reactions  . Food Anaphylaxis    TREE NUTS  . Shellfish Allergy Anaphylaxis  . Other Other (See Comments)    Powdered eggs- swelling, widespread rash  . Prednisone     "psychosis"  . Venomil Honey Bee Venom [Honey Bee Venom] Swelling    Localized swelling       Review of Systems  Constitutional: Negative for fatigue.  Eyes: Negative for visual disturbance.  Respiratory: Negative for cough, chest tightness and shortness of breath.   Cardiovascular: Negative for chest pain, palpitations and leg swelling.  Neurological: Negative for dizziness, syncope, weakness, light-headedness and headaches.       Objective:   Physical Exam  Constitutional: He is oriented to person, place, and time. He appears well-developed and well-nourished.  HENT:  Right Ear: External ear normal.  Left Ear: External ear normal.  Mouth/Throat: Oropharynx is clear and moist.  Eyes: Pupils are equal, round,  and reactive to light.  Neck: Neck supple. No thyromegaly present.  Cardiovascular: Normal rate and regular rhythm.  Pulmonary/Chest: Effort normal and breath sounds normal. No respiratory distress. He has no wheezes. He has no rales.  Musculoskeletal: He exhibits no edema.  Neurological: He is alert and oriented to person, place, and time.       Assessment:     #1 type 2 diabetes well controlled with A1c  6.5%  #2 hypertension. Slightly elevated reading here initially today. Repeat after rest 138/78  #3 hyperlipidemia well controlled  #4 history of CAD with prior CABG    Plan:     -Continue current medication regimen -Keep caffeine intake low. We discussed urinary urgency. Would like to avoid especially anticholinergic medications at this time. Symptoms become more severe.  Eulas Post MD [facility] at [facility]

## 2017-06-30 ENCOUNTER — Other Ambulatory Visit: Payer: Self-pay | Admitting: Neurology

## 2017-06-30 DIAGNOSIS — E785 Hyperlipidemia, unspecified: Secondary | ICD-10-CM

## 2017-07-01 NOTE — Telephone Encounter (Signed)
Yes  Refill for one year. 

## 2017-07-03 ENCOUNTER — Other Ambulatory Visit: Payer: Self-pay | Admitting: Family Medicine

## 2017-07-03 DIAGNOSIS — E785 Hyperlipidemia, unspecified: Secondary | ICD-10-CM

## 2017-07-22 ENCOUNTER — Other Ambulatory Visit: Payer: Self-pay | Admitting: Cardiology

## 2017-07-23 NOTE — Telephone Encounter (Signed)
Rx(s) sent to pharmacy electronically.  

## 2017-09-10 NOTE — Progress Notes (Addendum)
Subjective:   Miguel Vazquez is a 77 y.o. male who presents for Medicare Annual/Subsequent preventive examination.  Reports health as fair  Last ov 06/11/2017 and today  Labs 03/14/2017;  Type 2 DM/ post CABG '  Takes care of disabled son. 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 OPHTHALMOLOGY EXAM  07/13/2015  . COLONOSCOPY  09/04/2016   Tetanus is due and will take Tdap  if injured  Dr. Elease Hashimoto does check your feet  Checked today with no issues   Eye exam to be scheduled with new doctor  Given resources  Cataracts removed x 1 year  Cardiac Risk Factors include: advanced age (>23men, >29 women);diabetes mellitus;dyslipidemia;sedentary lifestyle;male gendereducated regarding shingrix   Colonoscopy [phone];      Objective:    Vitals: BP 130/80   Pulse 71   Ht 5\' 8"  (1.727 m)   Wt 165 lb (74.8 kg)   SpO2 97%   BMI 25.09 kg/m   Body mass index is 25.09 kg/m.  Advanced Directives 09/11/2017 03/06/2017 02/07/2017 09/03/2016 01/06/2016 01/06/2016 03/19/2015  Does Patient Have a Medical Advance Directive? Yes Yes Yes No Yes No No  Type of Advance Directive - Healthcare Power of [place];Living will - Living will - -  Does patient want to make changes to medical advance directive? - - - - No - Patient declined - -  Copy of [place] in Chart? - - - - No - copy requested - -  Would patient like information on creating a medical advance directive? - - - No - Patient declined - - No - patient declined information    Tobacco Social History   Tobacco Use  Smoking Status Former Smoker  . Packs/day: 1.50  . Years: 20.00  . Pack years: 30.00  . Types: Cigarettes    . Last attempt to quit: 08/24/1986  . Years since quitting: 31.0  Smokeless Tobacco Never Used     Counseling given: Yes   Clinical Intake:     Past Medical History:  Diagnosis Date  . Arthritis   . Brain aneurysm 2018   followed by Dr [name]  . Cataract   . Coronary artery disease   . Diabetes mellitus   . GERD (gastroesophageal reflux disease)   . Hx of abdominal aortic aneurysm 1998  . Hyperlipidemia   . Hypertension   . Personal history of colonic polyps - adenomas 09/09/2013   Past Surgical History:  Procedure Laterality Date  . ABDOMINAL AORTIC ANEURYSM REPAIR  1998  . CARDIAC CATHETERIZATION N/A 03/18/2015   Procedure: Left Heart Cath and Coronary Angiography;  Surgeon: Peter M Martinique, MD;  Location: [place] [facility];  Service: Cardiovascular;  Laterality: N/A;  . cataract surgery Right   . COLONOSCOPY    . CORONARY ARTERY BYPASS GRAFT N/A 03/18/2015   Procedure: CORONARY ARTERY BYPASS GRAFTING (CABG) x  three, using left internal mammary artery and right leg greater saphenous vein harvested endoscopically;  Surgeon: Melrose Nakayama, MD;  Location: [facility];  Service: Open Heart Surgery;  Laterality: N/A;  . INGUINAL HERNIA REPAIR Bilateral   .  INTRAOPERATIVE TRANSESOPHAGEAL ECHOCARDIOGRAM N/A 03/18/2015   Procedure: INTRAOPERATIVE TRANSESOPHAGEAL ECHOCARDIOGRAM;  Surgeon: Melrose Nakayama, MD;  Location: [facility];  Service: Open Heart Surgery;  Laterality: N/A;  . open heart surgery  2016  . UMBILICAL HERNIA REPAIR  1965   Family History  Problem Relation Age of Onset  . Bone cancer Mother   . Diabetes Paternal Aunt   . Stroke Son   . Down syndrome Son   . Colon cancer Neg Hx    Social History   Socioeconomic History  . Marital status: Married    Spouse name: [name]  . Number of children: 3  . Years of education: Not on file  . Highest education level: Not on file  Occupational History  . Not on file  Social Needs  . Financial resource strain: Not on  file  . Food insecurity:    Worry: Not on file    Inability: Not on file  . Transportation needs:    Medical: Not on file    Non-medical: Not on file  Tobacco Use  . Smoking status: Former Smoker    Packs/day: 1.50    Years: 20.00    Pack years: 30.00    Types: Cigarettes    Last attempt to quit: 08/24/1986    Years since quitting: 31.0  . Smokeless tobacco: Never Used  Substance and Sexual Activity  . Alcohol use: Yes    Comment: occ  . Drug use: No  . Sexual activity: Not on file  Lifestyle  . Physical activity:    Days per week: Not on file    Minutes per session: Not on file  . Stress: Not on file  Relationships  . Social connections:    Talks on phone: Not on file    Gets together: Not on file    Attends religious service: Not on file    Active member of club or organization: Not on file    Attends meetings of clubs or organizations: Not on file    Relationship status: Not on file  Other Topics Concern  . Not on file  Social History Narrative  . Not on file    Outpatient Encounter Medications as of 09/11/2017  Medication Sig  . aspirin EC 81 MG tablet Take 1 tablet (81 mg total) by mouth daily.  Marland Kitchen atorvastatin (LIPITOR) 80 MG tablet TAKE 1 TABLET (80 MG TOTAL) BY MOUTH DAILY AT 6 PM.  . carvedilol (COREG) 6.25 MG tablet TAKE 1 TABLET BY MOUTH TWICE A DAY  . EPINEPHrine 0.3 mg/0.3 mL IJ SOAJ injection Inject 0.3 mLs (0.3 mg total) into the muscle as needed (tongue swelling).  Marland Kitchen glucose blood (FREESTYLE LITE) test strip Check Blood Sugars 1-2 times per day. DX: E11.9.  . Lancets (FREESTYLE) lancets Check Blood Sugars 1-2 times per day. DX: E11.9  . levothyroxine (SYNTHROID, LEVOTHROID) 25 MCG tablet TAKE 1 TABLET (25 MCG TOTAL) BY MOUTH DAILY BEFORE BREAKFAST.  Marland Kitchen losartan (COZAAR) 100 MG tablet Take 1 tablet (100 mg total) by mouth daily.  . metFORMIN (GLUCOPHAGE) 1000 MG tablet Take 1 tablet (1,000 mg total) by mouth 2 (two) times daily.  . Multiple Vitamin  (MULTIVITAMIN) tablet Take 1 tablet by mouth daily. Centrium Silver once daily   . vitamin B-12 (CYANOCOBALAMIN) 1000 MCG tablet Take 1,000 mcg by mouth daily.  . vitamin C (ASCORBIC ACID) 500 MG tablet Take 500 mg by mouth daily.   No facility-administered encounter medications on file as of 09/11/2017.  Activities of Daily Living In your present state of health, do you have any difficulty performing the following activities: 09/11/2017  Hearing? N  Vision? N  Difficulty concentrating or making decisions? N  Walking or climbing stairs? N  Dressing or bathing? N  Doing errands, shopping? N  Preparing Food and eating ? N  Using the Toilet? N  In the past six months, have you accidently leaked urine? N  Do you have problems with loss of bowel control? N  Managing your Medications? N  Managing your Finances? N  Housekeeping or managing your Housekeeping? N  Some recent data might be hidden    Patient Care Team: Eulas Post, MD as PCP - Huston Foley, MD as Consulting Physician (General Surgery) Gatha Mayer, MD as Consulting Physician (Gastroenterology) Martinique, Peter M, MD as Consulting Physician (Cardiology) Melrose Nakayama, MD as Consulting Physician (Cardiothoracic Surgery)   Assessment:   This is a routine wellness examination for Ryzen.   Exercise Activities and Dietary recommendations Will try to start walking again. Son had stroke and they are rendering care    Current Exercise Habits: Home exercise routine, Type of exercise: walking, Time (Minutes): 30(has not been consistent since son had a stroke ), Frequency (Times/Week): 5, Weekly Exercise (Minutes/Week): 150  Goals    . Patient Stated     [place] more time there. Built a ramp and need to take off!  Try to make your walks!        Fall Risk Fall Risk  09/11/2017 03/14/2017 04/23/2016 01/18/2016 06/13/2015  Falls in the past year? No No No No No      Depression Screen PHQ 2/9 Scores 09/11/2017 12/12/2016 08/03/2015 06/13/2015  PHQ - 2 Score 0 0 0 0    Cognitive Function MMSE - Mini Mental State Exam 09/11/2017  Not completed: (No Data)   Ad8 score reviewed for issues:  Issues making decisions:  Less interest in hobbies / activities:  Repeats questions, stories (family complaining):  Trouble using ordinary gadgets (microwave, computer, phone):  Forgets the month or year:   Mismanaging finances:   Remembering appts:  Daily problems with thinking and/or memory: Ad8 score is=0  Discussed memory; states he will have recall issues but not interfering with his life. Discussed memory test and waived today. Under ongoing stress of care giving. Their routine has changed;       Immunization History  Administered Date(s) Administered  . Influenza Split 01/15/2011  . Influenza, High Dose Seasonal PF 12/12/2016  . Influenza,inj,Quad PF,6+ Mos 11/18/2013, 01/07/2016  . Pneumococcal Conjugate-13 05/21/2014  . Pneumococcal Polysaccharide-23 11/18/2012  . Zoster 05/21/2014      Screening Tests Health Maintenance  Topic Date Due  . OPHTHALMOLOGY EXAM  07/13/2015  . COLONOSCOPY  09/04/2016  . TETANUS/TDAP  09/12/2018 (Originally 01/23/1960)  . INFLUENZA VACCINE  10/31/2017  . HEMOGLOBIN A1C  03/13/2018  . FOOT EXAM  09/12/2018  . PNA vac Low Risk Adult  Completed         Plan:      PCP Notes   Health Maintenance Needs Tetanus and will take at the pharmacy or if he incurs a wound  To have his eye checked; would like a different provider and was given list of ophthalmologist in [place]   Due for colonoscopy 08/2016; states he will fup with wife as she manages his apt. May be putting off due to care of son   Given  resources to consider for home support and personal care of son with stroke and DD.  Also given number for the [facility] for disability Given resources for adaptive equipment online     Abnormal Screens  None   Referrals  None   Patient concerns; As noted; would like to try to get back to walking in the park 2 to 3 days a week; ( did walk with his son)   Nurse Concerns; As noted   Next PCP apt Was seen today and will schedule for 4 months       I have personally reviewed and noted the following in the patient's chart:   . Medical and social history . Use of alcohol, tobacco or illicit drugs  . Current medications and supplements . Functional ability and status . Nutritional status . Physical activity . Advanced directives . List of other physicians . Hospitalizations, surgeries, and ER visits in previous 12 months . Vitals . Screenings to include cognitive, depression, and falls . Referrals and appointments  In addition, I have reviewed and discussed with patient certain preventive protocols, quality metrics, and best practice recommendations. A written personalized care plan for preventive services as well as general preventive health recommendations were provided to patient.     TRRNH,AFBXU, RN  09/11/2017  I have reviewed the documentation for the AWV and [place] provided by the health coach and agree with their documentation. I was immediately available for any questions  Eulas Post MD [facility] at [facility]

## 2017-09-11 ENCOUNTER — Ambulatory Visit (INDEPENDENT_AMBULATORY_CARE_PROVIDER_SITE_OTHER): Payer: PPO

## 2017-09-11 ENCOUNTER — Ambulatory Visit (INDEPENDENT_AMBULATORY_CARE_PROVIDER_SITE_OTHER): Payer: PPO | Admitting: Family Medicine

## 2017-09-11 ENCOUNTER — Encounter: Payer: Self-pay | Admitting: Family Medicine

## 2017-09-11 VITALS — BP 130/80 | HR 71 | Ht 68.0 in | Wt 165.0 lb

## 2017-09-11 VITALS — BP 130/80 | HR 71 | Temp 98.2°F | Wt 165.1 lb

## 2017-09-11 DIAGNOSIS — I10 Essential (primary) hypertension: Secondary | ICD-10-CM

## 2017-09-11 DIAGNOSIS — E1121 Type 2 diabetes mellitus with diabetic nephropathy: Secondary | ICD-10-CM

## 2017-09-11 DIAGNOSIS — Z0000 Encounter for general adult medical examination without abnormal findings: Secondary | ICD-10-CM

## 2017-09-11 DIAGNOSIS — E7800 Pure hypercholesterolemia, unspecified: Secondary | ICD-10-CM | POA: Diagnosis not present

## 2017-09-11 LAB — POCT GLYCOSYLATED HEMOGLOBIN (HGB A1C): Hemoglobin A1C: 6.4 % — AB (ref 4.0–5.6)

## 2017-09-11 NOTE — Progress Notes (Signed)
Subjective:     Patient ID: Miguel Vazquez, male   DOB: 07-02-1940, 77 y.o.   MRN: [id number]  HPI Patient seen for medical follow-up. He has history of type 2 diabetes, hyperlipidemia, hypertension, CAD, history of hypothyroidism. Doing relatively well. He has a son with Down syndrome who has had recurrent strokes and apparently has developed some dementia. He and his wife have been very busy with his care- 24/7 care. They do get some assistance  Type 2 diabetes. Stable by home readings. No polyuria or polydipsia. Remains on metformin  Hypertension treated with losartan and carvedilol. No dizziness. No headaches. No chest pains.  He has hyperlipidemia treated with atorvastatin high-dose. No myalgias.  Past Medical History:  Diagnosis Date  . Arthritis   . Brain aneurysm 2018   followed by Dr Miguel Vazquez  . Cataract   . Coronary artery disease   . Diabetes mellitus   . GERD (gastroesophageal reflux disease)   . Hx of abdominal aortic aneurysm 1998  . Hyperlipidemia   . Hypertension   . Personal history of colonic polyps - adenomas 09/09/2013   Past Surgical History:  Procedure Laterality Date  . ABDOMINAL AORTIC ANEURYSM REPAIR  1998  . CARDIAC CATHETERIZATION N/A 03/18/2015   Procedure: Left Heart Cath and Coronary Angiography;  Surgeon: Miguel M Martinique, MD;  Location: [facility];  Service: Cardiovascular;  Laterality: N/A;  . cataract surgery Right   . COLONOSCOPY    . CORONARY ARTERY BYPASS GRAFT N/A 03/18/2015   Procedure: CORONARY ARTERY BYPASS GRAFTING (CABG) x  three, using left internal mammary artery and right leg greater saphenous vein harvested endoscopically;  Surgeon: Miguel Nakayama, MD;  Location: [facility];  Service: Open Heart Surgery;  Laterality: N/A;  . INGUINAL HERNIA REPAIR Bilateral   . INTRAOPERATIVE TRANSESOPHAGEAL ECHOCARDIOGRAM N/A 03/18/2015   Procedure: INTRAOPERATIVE TRANSESOPHAGEAL ECHOCARDIOGRAM;  Surgeon: Miguel Nakayama, MD;  Location: [facility];   Service: Open Heart Surgery;  Laterality: N/A;  . open heart surgery  2016  . [place]    reports that he quit smoking about 31 years ago. His smoking use included cigarettes. He has a 30.00 pack-year smoking history. He has never used smokeless tobacco. He reports that he drinks alcohol. He reports that he does not use drugs. family history includes Bone cancer in his mother; Diabetes in his paternal aunt; Down syndrome in his son; Stroke in his son. Allergies  Allergen Reactions  . Food Anaphylaxis    TREE NUTS  . Shellfish Allergy Anaphylaxis  . Other Other (See Comments)    Powdered eggs- swelling, widespread rash  . Prednisone     "psychosis"  . Venomil Honey Bee Venom [Honey Bee Venom] Swelling    Localized swelling     Review of Systems  Constitutional: Negative for fatigue.  Eyes: Negative for visual disturbance.  Respiratory: Negative for cough, chest tightness and shortness of breath.   Cardiovascular: Negative for chest pain, palpitations and leg swelling.  Gastrointestinal: Negative for abdominal pain.  Endocrine: Negative for polydipsia and polyuria.  Musculoskeletal: Positive for arthralgias.  Neurological: Negative for dizziness, syncope, weakness, light-headedness and headaches.       Objective:   Physical Exam  Constitutional: He is oriented to person, place, and time. He appears well-developed and well-nourished.  HENT:  Right Ear: External ear normal.  Left Ear: External ear normal.  Mouth/Throat: Oropharynx is clear and moist.  Eyes: Pupils are equal, round, and reactive to light.  Neck: Neck supple. No thyromegaly present.  Cardiovascular: Normal rate and regular rhythm.  Pulmonary/Chest: Effort normal and breath sounds normal. No respiratory distress. He has no wheezes. He has no rales.  Musculoskeletal: He exhibits no edema.  Neurological: He is alert and oriented to person, place, and time.       Assessment:     #1 type 2  diabetes well controlled with A1c 6.4%  #2 hypertension stable and at goal  #3 dyslipidemia. Goal LDL less than 70    Plan:     -Continue current medications  -Recommend routine follow-up in 4 months. Plan to get follow-up labs at that point with thyroid studies, repeat A1c, lipids  -He is encouraged to try to step up his walking and aerobic exercise  Miguel Post MD [facility]

## 2017-09-11 NOTE — Patient Instructions (Addendum)
Miguel Vazquez , Thank you for taking time to come for your Medicare Wellness Visit. I appreciate your ongoing commitment to your health goals. Please review the following plan we discussed and let me know if I can assist you in the future. "  A Tetanus is recommended every 10 years. Medicare covers a tetanus if you have a cut or wound; otherwise, there may be a charge. If you had not had a tetanus with pertusses, known as the Tdap, you can take this anytime.    May need to get your eye exam to check diabetes problems; given resources available   Wife takes care of colonoscopy and this is due. Last done 09/04/2013; due 08/2016    [facility]; [phone] Sr. Line; [phone] Get resource to get information on any and all community programs for Seniors  Community solutions; "Aging Gracefully In Place" program; can request or apply  [place]: 805-718-2582 Community Health Response Program -937-902-4097 Public Health Dept; Need to be a skilled visit but can assist with bathing as well; 830-327-8923  Adult center for Enrichment;  Call Senior Line; [phone]  Adult day services include Adult Day Care, Adult Day Healthcare, Group Respite, Care Partners, Volunteer In Home Respite, Education and Support Program  Help with Rx at [place]  Monday - Friday 8am to 10pm EST Sat- Sunday 9am to 7pm Patient help line ([phone] Email support online at GeminiCard.gl  Dept of [place]; Call 717-196-0393 and ask for SW on call  Options for Medicaid include the Community Alternatives program; [place]-PCS.org (personal care services) or PACE program, which is a medical and social program combined  Braulio Conte manages the community Alternatives program at the [employer]; 563-149-7026 (this is a program with a waiting list but provides SNF care at home; also a Comm alternatives for Developmental disabled  [place] 2 required; Call Claiborne Billings and she will send  out packet of information   Caregiver support group and information regarding [facility] is at the; [facility] Address: [address] [place], [place] [address]  Phone: 2540536534   MobileCycles.pl general resources for food etc   Http://nihseniorhealth.giv  Deaf & Hard of Hearing Division Services - can assist with hearing aid x 1  No reviews  [place]  [address]  940-877-0808  Resource to find disability equipment (used) online; https://bradley.com/  Up walker for those that need more support!  The  [facility] for [facility] [place], [place] [address] Contact Info TTY: ([phone] FAX: [phone] EMAIL: [email or web address]    These are the goals we discussed: Goals    . Patient Stated     [place] would [place] spend more time there. Built a ramp and need to take off!  Try to make your walks!        This is a list of the screening recommended for you and due dates:  Health Maintenance  Topic Date Due  . Tetanus Vaccine  01/23/1961  . Complete foot exam   05/22/2015  . Eye exam for diabetics  07/13/2015  . Colon Cancer Screening  09/04/2016  . Flu Shot  10/31/2017  . Hemoglobin A1C  12/12/2017  . Pneumonia vaccines  Completed      Fall Prevention in the Home Falls can cause injuries. They can happen to people of all ages. There are many things you can do to make your home safe and to  help prevent falls. What can I do on the outside of my home?  Regularly fix the edges of walkways and driveways and fix any cracks.  Remove anything that might make you trip as you walk through a door, such as a raised step or threshold.  Trim any bushes or trees on the path to your home.  Use bright outdoor lighting.  Clear any walking paths of anything that might make someone trip, such as rocks or  tools.  Regularly check to see if handrails are loose or broken. Make sure that both sides of any steps have handrails.  Any raised decks and porches should have guardrails on the edges.  Have any leaves, snow, or ice cleared regularly.  Use sand or salt on walking paths during winter.  Clean up any spills in your garage right away. This includes oil or grease spills. What can I do in the bathroom?  Use night lights.  Install grab bars by the toilet and in the tub and shower. Do not use towel bars as grab bars.  Use non-skid mats or decals in the tub or shower.  If you need to sit down in the shower, use a plastic, non-slip stool.  Keep the floor dry. Clean up any water that spills on the floor as soon as it happens.  Remove soap buildup in the tub or shower regularly.  Attach bath mats securely with double-sided non-slip rug tape.  Do not have throw rugs and other things on the floor that can make you trip. What can I do in the bedroom?  Use night lights.  Make sure that you have a light by your bed that is easy to reach.  Do not use any sheets or blankets that are too big for your bed. They should not hang down onto the floor.  Have a firm chair that has side arms. You can use this for support while you get dressed.  Do not have throw rugs and other things on the floor that can make you trip. What can I do in the kitchen?  Clean up any spills right away.  Avoid walking on wet floors.  Keep items that you use a lot in easy-to-reach places.  If you need to reach something above you, use a strong step stool that has a grab bar.  Keep electrical cords out of the way.  Do not use floor polish or wax that makes floors slippery. If you must use wax, use non-skid floor wax.  Do not have throw rugs and other things on the floor that can make you trip. What can I do with my stairs?  Do not leave any items on the stairs.  Make sure that there are handrails on both  sides of the stairs and use them. Fix handrails that are broken or loose. Make sure that handrails are as long as the stairways.  Check any carpeting to make sure that it is firmly attached to the stairs. Fix any carpet that is loose or worn.  Avoid having throw rugs at the top or bottom of the stairs. If you do have throw rugs, attach them to the floor with carpet tape.  Make sure that you have a light switch at the top of the stairs and the bottom of the stairs. If you do not have them, ask someone to add them for you. What else can I do to help prevent falls?  Wear shoes that: ? Do not have high heels. ?  Have rubber bottoms. ? Are comfortable and fit you well. ? Are closed at the toe. Do not wear sandals.  If you use a stepladder: ? Make sure that it is fully opened. Do not climb a closed stepladder. ? Make sure that both sides of the stepladder are locked into place. ? Ask someone to hold it for you, if possible.  Clearly mark and make sure that you can see: ? Any grab bars or handrails. ? First and last steps. ? Where the edge of each step is.  Use tools that help you move around (mobility aids) if they are needed. These include: ? Canes. ? Walkers. ? Scooters. ? Crutches.  Turn on the lights when you go into a dark area. Replace any light bulbs as soon as they burn out.  Set up your furniture so you have a clear path. Avoid moving your furniture around.  If any of your floors are uneven, fix them.  If there are any pets around you, be aware of where they are.  Review your medicines with your doctor. Some medicines can make you feel dizzy. This can increase your chance of falling. Ask your doctor what other things that you can do to help prevent falls. This information is not intended to replace advice given to you by your health care provider. Make sure you discuss any questions you have with your health care provider. Document Released: 01/13/2009 Document Revised:  08/25/2015 Document Reviewed: 04/23/2014 Elsevier Interactive Patient Education  2018 [place] Maintenance, Male A healthy lifestyle and preventive care is important for your health and wellness. Ask your health care provider about what schedule of regular examinations is right for you. What should I know about weight and diet? Eat a Healthy Diet  Eat plenty of vegetables, fruits, whole grains, low-fat dairy products, and lean protein.  Do not eat a lot of foods high in solid fats, added sugars, or salt.  Maintain a Healthy Weight Regular exercise can help you achieve or maintain a healthy weight. You should:  Do at least 150 minutes of exercise each week. The exercise should increase your heart rate and make you sweat (moderate-intensity exercise).  Do strength-training exercises at least twice a week.  Watch Your Levels of Cholesterol and Blood Lipids  Have your blood tested for lipids and cholesterol every 5 years starting at 76 years of age. If you are at high risk for heart disease, you should start having your blood tested when you are 76 years old. You may need to have your cholesterol levels checked more often if: ? Your lipid or cholesterol levels are high. ? You are older than 76 years of age. ? You are at high risk for heart disease.  What should I know about cancer screening? Many types of cancers can be detected early and may often be prevented. Lung Cancer  You should be screened every year for lung cancer if: ? You are a current smoker who has smoked for at least 30 years. ? You are a former smoker who has quit within the past 15 years.  Talk to your health care provider about your screening options, when you should start screening, and how often you should be screened.  Colorectal Cancer  Routine colorectal cancer screening usually begins at 76 years of age and should be repeated every 5-10 years until you are 76 years old. You may need to be  screened more often if early forms of precancerous polyps  or small growths are found. Your health care provider may recommend screening at an earlier age if you have risk factors for colon cancer.  Your health care provider may recommend using home test kits to check for hidden blood in the stool.  A small camera at the end of a tube can be used to examine your colon (sigmoidoscopy or colonoscopy). This checks for the earliest forms of colorectal cancer.  Prostate and Testicular Cancer  Depending on your age and overall health, your health care provider may do certain tests to screen for prostate and testicular cancer.  Talk to your health care provider about any symptoms or concerns you have about testicular or prostate cancer.  Skin Cancer  Check your skin from head to toe regularly.  Tell your health care provider about any new moles or changes in moles, especially if: ? There is a change in a mole's size, shape, or color. ? You have a mole that is larger than a pencil eraser.  Always use sunscreen. Apply sunscreen liberally and repeat throughout the day.  Protect yourself by wearing long sleeves, pants, a wide-brimmed hat, and sunglasses when outside.  What should I know about heart disease, diabetes, and high blood pressure?  If you are 25-39 years of age, have your blood pressure checked every 3-5 years. If you are 63 years of age or older, have your blood pressure checked every year. You should have your blood pressure measured twice-once when you are at a hospital or clinic, and once when you are not at a hospital or clinic. Record the average of the two measurements. To check your blood pressure when you are not at a hospital or clinic, you can use: ? An automated blood pressure machine at a pharmacy. ? A home blood pressure monitor.  Talk to your health care provider about your target blood pressure.  If you are between 19-59 years old, ask your health care provider if you  should take aspirin to prevent heart disease.  Have regular diabetes screenings by checking your fasting blood sugar level. ? If you are at a normal weight and have a low risk for diabetes, have this test once every three years after the age of 39. ? If you are overweight and have a high risk for diabetes, consider being tested at a younger age or more often.  A one-time screening for abdominal aortic aneurysm (AAA) by ultrasound is recommended for men aged 38-75 years who are current or former smokers. What should I know about preventing infection? Hepatitis B If you have a higher risk for hepatitis B, you should be screened for this virus. Talk with your health care provider to find out if you are at risk for hepatitis B infection. Hepatitis C Blood testing is recommended for:  Everyone born from [birth year] through 1964.  Anyone with known risk factors for hepatitis C.  Sexually Transmitted Diseases (STDs)  You should be screened each year for STDs including gonorrhea and chlamydia if: ? You are sexually active and are younger than 76 years of age. ? You are older than 76 years of age and your health care provider tells you that you are at risk for this type of infection. ? Your sexual activity has changed since you were last screened and you are at an increased risk for chlamydia or gonorrhea. Ask your health care provider if you are at risk.  Talk with your health care provider about whether you are at high risk of  being infected with HIV. Your health care provider may recommend a prescription medicine to help prevent HIV infection.  What else can I do?  Schedule regular health, dental, and eye exams.  Stay current with your vaccines (immunizations).  Do not use any tobacco products, such as cigarettes, chewing tobacco, and e-cigarettes. If you need help quitting, ask your health care provider.  Limit alcohol intake to no more than 2 drinks per day. One drink equals 12 ounces of beer,  5 ounces of wine, or 1 ounces of hard liquor.  Do not use street drugs.  Do not share needles.  Ask your health care provider for help if you need support or information about quitting drugs.  Tell your health care provider if you often feel depressed.  Tell your health care provider if you have ever been abused or do not feel safe at home. This information is not intended to replace advice given to you by your health care provider. Make sure you discuss any questions you have with your health care provider. Document Released: 09/15/2007 Document Revised: 11/16/2015 Document Reviewed: 12/21/2014 Elsevier Interactive Patient Education  [employer].

## 2017-09-11 NOTE — Patient Instructions (Signed)
Let's plan on 4 month follow up.  We will plan on getting some labs then.

## 2017-11-30 ENCOUNTER — Other Ambulatory Visit: Payer: Self-pay | Admitting: Family Medicine

## 2017-12-16 ENCOUNTER — Encounter: Payer: Self-pay | Admitting: Internal Medicine

## 2017-12-20 ENCOUNTER — Ambulatory Visit (AMBULATORY_SURGERY_CENTER): Payer: Self-pay | Admitting: *Deleted

## 2017-12-20 ENCOUNTER — Encounter: Payer: Self-pay | Admitting: Internal Medicine

## 2017-12-20 VITALS — Ht 68.0 in | Wt 164.8 lb

## 2017-12-20 DIAGNOSIS — Z86010 Personal history of colonic polyps: Secondary | ICD-10-CM

## 2017-12-20 NOTE — Progress Notes (Signed)
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

## 2018-01-02 ENCOUNTER — Encounter: Payer: Self-pay | Admitting: Internal Medicine

## 2018-01-02 ENCOUNTER — Ambulatory Visit (AMBULATORY_SURGERY_CENTER): Payer: PPO | Admitting: Internal Medicine

## 2018-01-02 VITALS — BP 124/70 | HR 76 | Temp 97.8°F | Resp 20 | Ht 68.0 in | Wt 164.0 lb

## 2018-01-02 DIAGNOSIS — Z86010 Personal history of colonic polyps: Secondary | ICD-10-CM

## 2018-01-02 MED ORDER — SODIUM CHLORIDE 0.9 % IV SOLN: 500.0000 mL | Freq: Once | INTRAVENOUS | Status: DC

## 2018-01-02 NOTE — Patient Instructions (Addendum)
No polyps today.  I do not recommend further routine colonoscopy - it is very unlikely to help you live longer. If you have colon problems in the future (I hope not) we can investigate if needed.  I appreciate the opportunity to care for you. Gatha Mayer, MD, [facility] .    YOU HAD AN ENDOSCOPIC PROCEDURE TODAY AT [facility]:   Refer to the procedure report that was given to you for any specific questions about what was found during the examination.  If the procedure report does not answer your questions, please call your gastroenterologist to clarify.  If you requested that your care partner not be given the details of your procedure findings, then the procedure report has been included in a sealed envelope for you to review at your convenience later.  YOU SHOULD EXPECT: Some feelings of bloating in the abdomen. Passage of more gas than usual.  Walking can help get rid of the air that was put into your GI tract during the procedure and reduce the bloating. If you had a lower endoscopy (such as a colonoscopy or flexible sigmoidoscopy) you may notice spotting of blood in your stool or on the toilet paper. If you underwent a bowel prep for your procedure, you may not have a normal bowel movement for a few days.  Please Note:  You might notice some irritation and congestion in your nose or some drainage.  This is from the oxygen used during your procedure.  There is no need for concern and it should clear up in a day or so.  SYMPTOMS TO REPORT IMMEDIATELY:   Following lower endoscopy (colonoscopy or flexible sigmoidoscopy):  Excessive amounts of blood in the stool  Significant tenderness or worsening of abdominal pains  Swelling of the abdomen that is new, acute  Fever of 100F or higher  For urgent or emergent issues, a gastroenterologist can be reached at any hour by calling [phone].   DIET:  We do recommend a small meal at first, but then you may proceed  to your regular diet.  Drink plenty of fluids but you should avoid alcoholic beverages for 24 hours.  ACTIVITY:  You should plan to take it easy for the rest of today and you should NOT DRIVE or use heavy machinery until tomorrow (because of the sedation medicines used during the test).    FOLLOW UP: Our staff will call the number listed on your records the next business day following your procedure to check on you and address any questions or concerns that you may have regarding the information given to you following your procedure. If we do not reach you, we will leave a message.  However, if you are feeling well and you are not experiencing any problems, there is no need to return our call.  We will assume that you have returned to your regular daily activities without incident.  If any biopsies were taken you will be contacted by phone or by letter within the next 1-3 weeks.  Please call us at [phone] if you have not heard about the biopsies in 3 weeks.    SIGNATURES/CONFIDENTIALITY: You and/or your care partner have signed paperwork which will be entered into your electronic medical record.  These signatures attest to the fact that that the information above on your After Visit Summary has been reviewed and is understood.  Full responsibility of the confidentiality of this discharge information lies with you and/or your care-partner.  Handout was given to your care partner on Diverticulosis. Your blood sugar was 138 in the recovery room. You may resume your current medications today. No repeat colonoscopy unless having symptoms. Please call if any questions or concerns.

## 2018-01-02 NOTE — Progress Notes (Signed)
Pt's states no medical or surgical changes since previsit or office visit. 

## 2018-01-02 NOTE — Progress Notes (Signed)
No problems noted in the recovery room. maw 

## 2018-01-02 NOTE — Op Note (Signed)
[place] Patient Name: Miguel Vazquez Procedure Date: 01/02/2018 11:38 AM MRN: [id number] Endoscopist: Gatha Mayer , MD Age: 77 Referring MD:  Date of Birth: 12-Aug-1940 Gender: Male Account #: [id number] Procedure:                Colonoscopy Indications:              High risk colon cancer surveillance: Personal                            history of colonic polyps Medicines:                Propofol per Anesthesia, Monitored Anesthesia Care Procedure:                Pre-Anesthesia Assessment:                           - Prior to the procedure, a History and Physical                            was performed, and patient medications and                            allergies were reviewed. The patient's tolerance of                            previous anesthesia was also reviewed. The risks                            and benefits of the procedure and the sedation                            options and risks were discussed with the patient.                            All questions were answered, and informed consent                            was obtained. Prior Anticoagulants: The patient has                            taken no previous anticoagulant or antiplatelet                            agents. ASA Grade Assessment: III - A patient with                            severe systemic disease. After reviewing the risks                            and benefits, the patient was deemed in                            satisfactory condition to undergo the procedure.  After obtaining informed consent, the colonoscope                            was passed under direct vision. Throughout the                            procedure, the patient's blood pressure, pulse, and                            oxygen saturations were monitored continuously. The                            Model CF-HQ190L [id number]) scope was introduced                            through the  anus and advanced to the the cecum,                            identified by appendiceal orifice and ileocecal                            valve. The ileocecal valve, appendiceal orifice,                            and rectum were photographed. The quality of the                            bowel preparation was good. The bowel preparation                            used was Miralax. Scope In: 11:48:26 AM Scope Out: 12:02:10 PM Scope Withdrawal Time: 0 hours 6 minutes 59 seconds  Total Procedure Duration: 0 hours 13 minutes 44 seconds  Findings:                 The perianal and digital rectal examinations were                            normal. Pertinent negatives include normal prostate                            (size, shape, and consistency).                           Many diverticula were found in the entire colon.                           The exam was otherwise without abnormality on                            direct and retroflexion views. Complications:            No immediate complications. Estimated blood loss:  None. Estimated Blood Loss:     Estimated blood loss: none. Impression:               - Severe diverticulosis in the entire examined                            colon.                           - The examination was otherwise normal on direct                            and retroflexion views.                           - No specimens collected.                           - Personal history of colonic polyps. 3 adenomas                            2015 Recommendation:           - Resume previous diet.                           - Continue present medications.                           - No repeat colonoscopy due to current age (29                            years or older) and the absence of colonic polyps. Gatha Mayer, MD 01/02/2018 12:07:53 PM This report has been signed electronically.

## 2018-01-02 NOTE — Progress Notes (Signed)
Alert and oriented x3, pleased with MAC, report to RN  

## 2018-01-03 ENCOUNTER — Telehealth: Payer: Self-pay

## 2018-01-03 NOTE — Telephone Encounter (Signed)
Pt has no voicemail.

## 2018-01-03 NOTE — Telephone Encounter (Signed)
  Follow up Call-  Call back number 01/02/2018  Post procedure Call Back phone  # [phone]  Permission to leave phone message Yes  Some recent data might be hidden     Left message

## 2018-01-13 ENCOUNTER — Ambulatory Visit: Payer: PPO | Admitting: Family Medicine

## 2018-01-20 ENCOUNTER — Other Ambulatory Visit: Payer: Self-pay

## 2018-01-20 ENCOUNTER — Ambulatory Visit (INDEPENDENT_AMBULATORY_CARE_PROVIDER_SITE_OTHER): Payer: PPO | Admitting: Family Medicine

## 2018-01-20 ENCOUNTER — Encounter: Payer: Self-pay | Admitting: Family Medicine

## 2018-01-20 VITALS — BP 134/78 | HR 75 | Temp 98.0°F | Ht 68.0 in | Wt 165.9 lb

## 2018-01-20 DIAGNOSIS — E7800 Pure hypercholesterolemia, unspecified: Secondary | ICD-10-CM | POA: Diagnosis not present

## 2018-01-20 DIAGNOSIS — R7989 Other specified abnormal findings of blood chemistry: Secondary | ICD-10-CM

## 2018-01-20 DIAGNOSIS — E039 Hypothyroidism, unspecified: Secondary | ICD-10-CM

## 2018-01-20 DIAGNOSIS — I10 Essential (primary) hypertension: Secondary | ICD-10-CM

## 2018-01-20 DIAGNOSIS — E1121 Type 2 diabetes mellitus with diabetic nephropathy: Secondary | ICD-10-CM | POA: Diagnosis not present

## 2018-01-20 LAB — POCT GLYCOSYLATED HEMOGLOBIN (HGB A1C): Hemoglobin A1C: 6 % — AB (ref 4.0–5.6)

## 2018-01-20 NOTE — Progress Notes (Signed)
Subjective:     Patient ID: Miguel Vazquez, male   DOB: 1940/05/03, 77 y.o.   MRN: [id number]  HPI Patient seen for medical follow-up.  He has history of hypertension, history of CAD, history of ischemic cardiomyopathy, type 2 diabetes, hypothyroidism, and dyslipidemia.  has generally done well.  He had some recent neck pain after lifting his son who has Down syndrome.  His neck pain is gradually improving.  He has not had any recent chest pains.  No focal neurologic symptoms.  Diabetes.  History of good control.  Last A1c 6.4%.  Takes metformin.  No polyuria or polydipsia.  He has hypothyroidism and is on low-dose levothyroxine 25 mcg daily.  Needs follow-up labs.  Lipids were checked about 11 months ago.  Still needs flu vaccine and plans to get through pharmacy.  Past Medical History:  Diagnosis Date  . Arthritis   . Brain aneurysm 2018   followed by Dr [name]  . Cataract   . Chronic kidney disease    kidney stone- 40 years ago  . Coronary artery disease   . Diabetes mellitus   . GERD (gastroesophageal reflux disease)   . Hx of abdominal aortic aneurysm 1998  . Hyperlipidemia   . Hypertension   . Myocardial infarction ([place])    2016  . Personal history of colonic polyps - adenomas 09/09/2013   Past Surgical History:  Procedure Laterality Date  . ABDOMINAL AORTIC ANEURYSM REPAIR  1998  . CARDIAC CATHETERIZATION N/A 03/18/2015   Procedure: Left Heart Cath and Coronary Angiography;  Surgeon: Peter M Martinique, MD;  Location: [facility];  Service: Cardiovascular;  Laterality: N/A;  . cataract surgery Right   . COLONOSCOPY    . CORONARY ARTERY BYPASS GRAFT N/A 03/18/2015   Procedure: CORONARY ARTERY BYPASS GRAFTING (CABG) x  three, using left internal mammary artery and right leg greater saphenous vein harvested endoscopically;  Surgeon: Melrose Nakayama, MD;  Location: [facility];  Service: Open Heart Surgery;  Laterality: N/A;  . INGUINAL HERNIA REPAIR Bilateral   . INTRAOPERATIVE  TRANSESOPHAGEAL ECHOCARDIOGRAM N/A 03/18/2015   Procedure: INTRAOPERATIVE TRANSESOPHAGEAL ECHOCARDIOGRAM;  Surgeon: Melrose Nakayama, MD;  Location: [facility];  Service: Open Heart Surgery;  Laterality: N/A;  . open heart surgery  2016  . [place]    reports that he quit smoking about 31 years ago. His smoking use included cigarettes. He has a 30.00 pack-year smoking history. He has never used smokeless tobacco. He reports that he drinks alcohol. He reports that he does not use drugs. family history includes Bone cancer in his mother; Diabetes in his paternal aunt; Down syndrome in his son; Stroke in his son. Allergies  Allergen Reactions  . Food Anaphylaxis    TREE NUTS  . Shellfish Allergy Anaphylaxis  . Prednisone     "psychosis"  . Venomil Honey Bee Venom [Honey Bee Venom] Swelling    Localized swelling     Review of Systems  Constitutional: Negative for fatigue.  Eyes: Negative for visual disturbance.  Respiratory: Negative for cough, chest tightness and shortness of breath.   Cardiovascular: Negative for chest pain, palpitations and leg swelling.  Neurological: Negative for dizziness, syncope, weakness, light-headedness and headaches.       Objective:   Physical Exam  Constitutional: He is oriented to person, place, and time. He appears well-developed and well-nourished.  HENT:  Right Ear: External ear normal.  Left Ear: External ear normal.  Mouth/Throat: Oropharynx is clear  and moist.  Eyes: Pupils are equal, round, and reactive to light.  Neck: Neck supple. No thyromegaly present.  Cardiovascular: Normal rate and regular rhythm.  Pulmonary/Chest: Effort normal and breath sounds normal. No respiratory distress. He has no wheezes. He has no rales.  Musculoskeletal: He exhibits no edema.  Neurological: He is alert and oriented to person, place, and time.  Skin:  Feet reveal no skin lesions. Good distal foot pulses. Good capillary refill. No  calluses. Normal sensation with monofilament testing        Assessment:     #1 type 2 diabetes well-controlled with A1c 6.0%  #2 dyslipidemia with goal LDL less than 70  #3 history of hypothyroidism on replacement  #4 hypertension- stable.    Plan:     -Flu vaccine through pharmacy -Check labs today with TSH, lipid panel, basic metabolic panel, hepatic panel -We will plan routine follow-up in 6 months and sooner as needed  Eulas Post MD [facility]

## 2018-02-25 ENCOUNTER — Other Ambulatory Visit: Payer: Self-pay | Admitting: Family Medicine

## 2018-03-10 ENCOUNTER — Encounter: Payer: Self-pay | Admitting: Family Medicine

## 2018-03-10 ENCOUNTER — Ambulatory Visit (INDEPENDENT_AMBULATORY_CARE_PROVIDER_SITE_OTHER): Payer: PPO | Admitting: Family Medicine

## 2018-03-10 ENCOUNTER — Other Ambulatory Visit: Payer: Self-pay

## 2018-03-10 VITALS — BP 142/82 | HR 74 | Temp 97.6°F | Ht 68.0 in | Wt 162.6 lb

## 2018-03-10 DIAGNOSIS — L853 Xerosis cutis: Secondary | ICD-10-CM

## 2018-03-10 DIAGNOSIS — E039 Hypothyroidism, unspecified: Secondary | ICD-10-CM | POA: Diagnosis not present

## 2018-03-10 DIAGNOSIS — M79602 Pain in left arm: Secondary | ICD-10-CM

## 2018-03-10 DIAGNOSIS — I10 Essential (primary) hypertension: Secondary | ICD-10-CM

## 2018-03-10 DIAGNOSIS — E785 Hyperlipidemia, unspecified: Secondary | ICD-10-CM | POA: Diagnosis not present

## 2018-03-10 DIAGNOSIS — R7989 Other specified abnormal findings of blood chemistry: Secondary | ICD-10-CM

## 2018-03-10 LAB — HEPATIC FUNCTION PANEL
ALT: 13 U/L (ref 0–53)
AST: 16 U/L (ref 0–37)
Albumin: 4.2 g/dL (ref 3.5–5.2)
Alkaline Phosphatase: 65 U/L (ref 39–117)
BILIRUBIN DIRECT: 0.1 mg/dL (ref 0.0–0.3)
Total Bilirubin: 0.6 mg/dL (ref 0.2–1.2)
Total Protein: 6.8 g/dL (ref 6.0–8.3)

## 2018-03-10 LAB — LIPID PANEL
CHOL/HDL RATIO: 3
Cholesterol: 120 mg/dL (ref 0–200)
HDL: 38 mg/dL — ABNORMAL LOW (ref >39.00)
LDL Cholesterol: 48 mg/dL (ref 0–99)
NonHDL: 82.01
TRIGLYCERIDES: 168 mg/dL — AB (ref 0.0–149.0)
VLDL: 33.6 mg/dL (ref 0.0–40.0)

## 2018-03-10 LAB — BASIC METABOLIC PANEL
BUN: 23 mg/dL (ref 6–23)
CHLORIDE: 100 meq/L (ref 96–112)
CO2: 31 mEq/L (ref 19–32)
Calcium: 9.6 mg/dL (ref 8.4–10.5)
Creatinine, Ser: 1.08 mg/dL (ref 0.40–1.50)
GFR: 70.63 mL/min (ref >60.00)
Glucose, Bld: 151 mg/dL — ABNORMAL HIGH (ref 70–99)
POTASSIUM: 4.1 meq/L (ref 3.5–5.1)
SODIUM: 141 meq/L (ref 135–145)

## 2018-03-10 LAB — TSH: TSH: 6.66 u[IU]/mL — ABNORMAL HIGH (ref 0.35–4.50)

## 2018-03-10 NOTE — Progress Notes (Signed)
Subjective:     Patient ID: Miguel Vazquez, male   DOB: 06-18-1940, 77 y.o.   MRN: [id number]  HPI Patient is seen for the following issues  He has some excoriations and dryness and itching involving both lower legs.  Present for few weeks.  He states he likes to take hot showers and frequently prolonged showers.  He does sometimes use moisturizer with [place] but not consistently  He complains of some left upper extremity pain.  His son has Down syndrome and he and his wife have to lift him frequently.  He denies any specific injury.  For 2 weeks now has had some left biceps pain.  No actual shoulder pain.  No neck pain.  Occasional bilateral wrist pain.  No chest pain or exertional dyspnea.  Past Medical History:  Diagnosis Date  . Arthritis   . Brain aneurysm 2018   followed by Dr [name]  . Cataract   . Chronic kidney disease    kidney stone- 40 years ago  . Coronary artery disease   . Diabetes mellitus   . GERD (gastroesophageal reflux disease)   . Hx of abdominal aortic aneurysm 1998  . Hyperlipidemia   . Hypertension   . Myocardial infarction ([place])    2016  . Personal history of colonic polyps - adenomas 09/09/2013   Past Surgical History:  Procedure Laterality Date  . ABDOMINAL AORTIC ANEURYSM REPAIR  1998  . CARDIAC CATHETERIZATION N/A 03/18/2015   Procedure: Left Heart Cath and Coronary Angiography;  Surgeon: Peter M Martinique, MD;  Location: [facility];  Service: Cardiovascular;  Laterality: N/A;  . cataract surgery Right   . COLONOSCOPY    . CORONARY ARTERY BYPASS GRAFT N/A 03/18/2015   Procedure: CORONARY ARTERY BYPASS GRAFTING (CABG) x  three, using left internal mammary artery and right leg greater saphenous vein harvested endoscopically;  Surgeon: Melrose Nakayama, MD;  Location: [facility];  Service: Open Heart Surgery;  Laterality: N/A;  . INGUINAL HERNIA REPAIR Bilateral   . INTRAOPERATIVE TRANSESOPHAGEAL ECHOCARDIOGRAM N/A 03/18/2015   Procedure:  INTRAOPERATIVE TRANSESOPHAGEAL ECHOCARDIOGRAM;  Surgeon: Melrose Nakayama, MD;  Location: [facility];  Service: Open Heart Surgery;  Laterality: N/A;  . open heart surgery  2016  . [place]    reports that he quit smoking about 31 years ago. His smoking use included cigarettes. He has a 30.00 pack-year smoking history. He has never used smokeless tobacco. He reports that he drinks alcohol. He reports that he does not use drugs. family history includes Bone cancer in his mother; Diabetes in his paternal aunt; Down syndrome in his son; Stroke in his son. Allergies  Allergen Reactions  . Food Anaphylaxis    TREE NUTS  . Shellfish Allergy Anaphylaxis  . Prednisone     "psychosis"  . Venomil Honey Bee Venom [Honey Bee Venom] Swelling    Localized swelling     Review of Systems  Constitutional: Negative for fatigue.  Eyes: Negative for visual disturbance.  Respiratory: Negative for cough, chest tightness and shortness of breath.   Cardiovascular: Negative for chest pain, palpitations and leg swelling.  Skin: Positive for rash.  Neurological: Negative for dizziness, syncope, weakness, light-headedness and headaches.       Objective:   Physical Exam  Constitutional: He is oriented to person, place, and time. He appears well-developed and well-nourished.  HENT:  Right Ear: External ear normal.  Left Ear: External ear normal.  Mouth/Throat: Oropharynx is clear and moist.  Eyes: Pupils are equal, round, and reactive to light.  Neck: Neck supple. No thyromegaly present.  Cardiovascular: Normal rate and regular rhythm.  Pulmonary/Chest: Effort normal and breath sounds normal. No respiratory distress. He has no wheezes. He has no rales.  Musculoskeletal: He exhibits no edema.  Full range of motion left shoulder.  No localized tenderness.  No biceps tenderness.  No acromioclavicular tenderness.  Neurological: He is alert and oriented to person, place, and time.  Skin:   Has some scattered excoriations lower legs bilaterally.  No actual rash otherwise.  He has dry skin.       Assessment:     #1 dry skin dermatitis lower legs  #2 left upper extremity pain.  Nonfocal exam    Plan:     -Avoid hot showers and also avoid prolonged showers -Continue gentle soap such as Dove -Apply moisturizer as soon as he dries off after getting out of shower.  We offered triamcinolone compounded with moisturizer and at this point he wishes to try moisturizer alone -Avoid scratching as much as possible -Observe left shoulder for now.  No evidence for rotator cuff injury  Eulas Post MD [facility]

## 2018-03-10 NOTE — Patient Instructions (Signed)
Avoid long showers  Avoid hot water as much as possible  Continue with gentle soap such as Dove  Apply moisturizer to skin as soon as drying off

## 2018-03-18 ENCOUNTER — Ambulatory Visit: Payer: Self-pay | Admitting: *Deleted

## 2018-03-18 NOTE — Telephone Encounter (Signed)
Patient called for lab results: Lab results given: Labs significant for: Elevated TSH, elevated glucose of 151 (not sure this was fasting). Lipids stable. Recommend: -increase Levothyroxine to 50 mcg once daily -repeat TSH in 2 months -set up 3 months follow up. Will check fasting glucose and A1C then.  Reason for Disposition . Caller requesting lab results    Results given to patient- appointments scheduled  Answer Assessment - Initial Assessment Questions 1. REASON FOR CALL or QUESTION: "What is your reason for calling today?" or "How can I best help you?" or "What question do you have that I can help answer?"     Patient calling for lab results 2. CALLER: Document the source of call. (e.g., laboratory, patient).     patient  Protocols used: PCP CALL - NO TRIAGE-A-AH

## 2018-03-19 ENCOUNTER — Other Ambulatory Visit: Payer: Self-pay

## 2018-03-19 MED ORDER — LEVOTHYROXINE SODIUM 50 MCG PO TABS: 50.0000 ug | ORAL_TABLET | Freq: Every day | ORAL | 3 refills | Status: DC

## 2018-03-24 ENCOUNTER — Other Ambulatory Visit: Payer: Self-pay | Admitting: Family Medicine

## 2018-03-24 DIAGNOSIS — E785 Hyperlipidemia, unspecified: Secondary | ICD-10-CM

## 2018-05-15 ENCOUNTER — Other Ambulatory Visit: Payer: Self-pay | Admitting: Cardiology

## 2018-05-19 ENCOUNTER — Other Ambulatory Visit (INDEPENDENT_AMBULATORY_CARE_PROVIDER_SITE_OTHER): Payer: PPO

## 2018-05-19 ENCOUNTER — Other Ambulatory Visit: Payer: Self-pay

## 2018-05-19 DIAGNOSIS — R7989 Other specified abnormal findings of blood chemistry: Secondary | ICD-10-CM

## 2018-05-20 LAB — TSH: TSH: 4.76 u[IU]/mL — ABNORMAL HIGH (ref 0.35–4.50)

## 2018-05-25 ENCOUNTER — Other Ambulatory Visit: Payer: Self-pay | Admitting: Family Medicine

## 2018-07-11 ENCOUNTER — Telehealth: Payer: Self-pay | Admitting: Cardiology

## 2018-07-11 NOTE — Telephone Encounter (Signed)
Smart phone/MyChart. Teleconsent for phone visit given. 07-11-18 ST

## 2018-07-14 ENCOUNTER — Telehealth: Payer: Self-pay | Admitting: Cardiology

## 2018-07-14 NOTE — Telephone Encounter (Signed)
Smart phone/MyChart/pre reg complete. 07-14-18 Teleconsent for phone visit given. 07-11-18 ST...Marland KitchenMarland Kitchen

## 2018-07-14 NOTE — Progress Notes (Deleted)
{Choose 1 Note Type (Telehealth Visit or Telephone Visit):[phone]}   Evaluation Performed:  Follow-up visit  Date:  07/14/2018   ID:  Miguel Vazquez, DOB 03/12/1941, MRN [id number]  {Patient Location:[phone]::"Home"}  {Provider Location:[phone]}  PCP:  Eulas Post, MD  Cardiologist:   Martinique MD Electrophysiologist:  None   Chief Complaint:  ***  History of Present Illness:    Miguel Vazquez is a 78 y.o. male who presents via audio/video conferencing for a telehealth visit today. He is being evaluation for CAD. He has a history of DM, HTN, CRI, and PVD s/p remote AAA R&G, admitted 03/18/15 with a STEMI. At cath he had severe 3V and left main CAD. EF 20--25%. He went for urgent CABG. He tolerated this well. Post op he had PAF and was placed on Amiodarone. Amiodarone has since been discontinued. Follow up Echo in  2017showed EF 40-45%. He was placed on lisinopril but developed hyperkalemia so this was stopped. On his follow up we resumed at a lower dose and potassium levels have been normal.  ***  The patient {does/does not:200015} have symptoms concerning for COVID-19 infection (fever, chills, cough, or new shortness of breath).    Past Medical History:  Diagnosis Date  . Arthritis   . Brain aneurysm 2018   followed by Dr [name]  . Cataract   . Chronic kidney disease    kidney stone- 40 years ago  . Coronary artery disease   . Diabetes mellitus   . GERD (gastroesophageal reflux disease)   . Hx of abdominal aortic aneurysm 1998  . Hyperlipidemia   . Hypertension   . Myocardial infarction ([place])    2016  . Personal history of colonic polyps - adenomas 09/09/2013   Past Surgical History:  Procedure Laterality Date  . ABDOMINAL AORTIC ANEURYSM REPAIR  1998  . CARDIAC CATHETERIZATION N/A 03/18/2015   Procedure: Left Heart Cath and Coronary Angiography;  Surgeon:  M Martinique, MD;  Location: [facility];  Service: Cardiovascular;  Laterality: N/A;  .  cataract surgery Right   . COLONOSCOPY    . CORONARY ARTERY BYPASS GRAFT N/A 03/18/2015   Procedure: CORONARY ARTERY BYPASS GRAFTING (CABG) x  three, using left internal mammary artery and right leg greater saphenous vein harvested endoscopically;  Surgeon: Melrose Nakayama, MD;  Location: [facility];  Service: Open Heart Surgery;  Laterality: N/A;  . INGUINAL HERNIA REPAIR Bilateral   . INTRAOPERATIVE TRANSESOPHAGEAL ECHOCARDIOGRAM N/A 03/18/2015   Procedure: INTRAOPERATIVE TRANSESOPHAGEAL ECHOCARDIOGRAM;  Surgeon: Melrose Nakayama, MD;  Location: [facility];  Service: Open Heart Surgery;  Laterality: N/A;  . open heart surgery  2016  . [place]     No outpatient medications have been marked as taking for the 07/15/18 encounter (Appointment) with Martinique,  M, MD.     Allergies:   Food; Shellfish allergy; Prednisone; and Venomil honey bee venom [honey bee venom]   Social History   Tobacco Use  . Smoking status: Former Smoker    Packs/day: 1.50    Years: 20.00    Pack years: 30.00    Types: Cigarettes    Last attempt to quit: 08/24/1986    Years since quitting: 31.9  . Smokeless tobacco: Never Used  Substance Use Topics  . Alcohol use: Yes    Comment: occ  . Drug use: No     Family Hx: The patient's family history includes Bone cancer in his mother; Diabetes in his paternal aunt; Down syndrome  in his son; Stroke in his son. There is no history of Colon cancer, Esophageal cancer, Rectal cancer, or Stomach cancer.  ROS:   Please see the history of present illness.    *** All other systems reviewed and are negative.   Prior CV studies:   The following studies were reviewed today:  Echo 12/13/15: Study Conclusions  - Left ventricle: The cavity size was normal. Wall thickness was   increased in a pattern of mild LVH. Systolic function was mildly   to moderately reduced. The estimated ejection fraction was in the   range of 40% to 45%. Diffuse  hypokinesis. There is dyskinesis of   the basalinferolateral myocardium. Doppler parameters are   consistent with abnormal left ventricular relaxation (grade 1   diastolic dysfunction). - Mitral valve: Calcified annulus. - Left atrium: The atrium was mildly dilated. - Pulmonary arteries: Systolic pressure was mildly increased. PA   peak pressure: 32 mm Hg (S).  Impressions:  - Global hypokinesis and dyskinesis of the basal inferior lateral   wall; overall mild to moderate LV dysfunction; grade 1 diastolic   dysfunction; mild LAE; mild TR with mildly elevated pulmonary   pressure.  Labs/Other Tests and Data Reviewed:    EKG:  No ECG reviewed.  Recent Labs: 03/10/2018: ALT 13; BUN 23; Creatinine, Ser 1.08; Potassium 4.1; Sodium 141 05/19/2018: TSH 4.76   Recent Lipid Panel Lab Results  Component Value Date/Time   CHOL 120 03/10/2018 02:27 PM   CHOL 116 03/14/2017 01:00 PM   TRIG 168.0 (H) 03/10/2018 02:27 PM   HDL 38.00 (L) 03/10/2018 02:27 PM   HDL 45 03/14/2017 01:00 PM   CHOLHDL 3 03/10/2018 02:27 PM   LDLCALC 48 03/10/2018 02:27 PM   LDLCALC 57 03/14/2017 01:00 PM   LDLDIRECT 84.1 05/21/2013 11:06 AM    Wt Readings from Last 3 Encounters:  03/10/18 162 lb 9.6 oz (73.8 kg)  01/20/18 165 lb 14.4 oz (75.3 kg)  01/02/18 164 lb (74.4 kg)     Objective:    Vital Signs:  There were no vitals taken for this visit.   Well nourished, well developed male in no*** acute distress. ***  ASSESSMENT & PLAN:    1. CAD s/p STEMI in December 2016. Urgent CABG for severe 3 vessel and left main disease. He is asymptomatic. Recommend reduce  ASA to 81 mg daily, continue beta blocker, and statin.  2. Ischemic cardiomyopathy. EF improved to 40-45%. Well compensated. Will continue Coreg  6.25 mg bid and losartan.   3. Hyperlipidemia. On lipitor. Excellent control.  4. Post op Afib. Resolved. No recurrence  5. DM type 2 with renal manifestations. Last A1c 6.2%. 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 S/p AAA repair.    COVID-19 Education: The signs and symptoms of COVID-19 were discussed with the patient and how to seek care for testing (follow up with PCP or arrange E-visit).  ***The importance of social distancing was discussed today.  Time:   Today, I have spent *** minutes with the patient with telehealth technology discussing the above problems.     Medication Adjustments/Labs and Tests Ordered: Current medicines are reviewed at length with the patient today.  Concerns regarding medicines are outlined above.  Tests Ordered: No orders of the defined types were placed in this encounter.  Medication Changes: No orders of the defined types were placed in this encounter.   Disposition:  Follow up {follow up:15908}  Signed,  Martinique, MD  07/14/2018  [place]

## 2018-07-15 ENCOUNTER — Telehealth: Payer: PPO | Admitting: Cardiology

## 2018-07-15 ENCOUNTER — Encounter: Payer: Self-pay | Admitting: Cardiology

## 2018-07-15 ENCOUNTER — Telehealth (INDEPENDENT_AMBULATORY_CARE_PROVIDER_SITE_OTHER): Payer: PPO | Admitting: Cardiology

## 2018-07-15 VITALS — BP 147/89 | HR 70 | Ht 68.0 in | Wt 150.0 lb

## 2018-07-15 DIAGNOSIS — E7800 Pure hypercholesterolemia, unspecified: Secondary | ICD-10-CM

## 2018-07-15 DIAGNOSIS — E1122 Type 2 diabetes mellitus with diabetic chronic kidney disease: Secondary | ICD-10-CM

## 2018-07-15 DIAGNOSIS — N183 Chronic kidney disease, stage 3 (moderate): Secondary | ICD-10-CM

## 2018-07-15 DIAGNOSIS — I129 Hypertensive chronic kidney disease with stage 1 through stage 4 chronic kidney disease, or unspecified chronic kidney disease: Secondary | ICD-10-CM | POA: Diagnosis not present

## 2018-07-15 DIAGNOSIS — I25810 Atherosclerosis of coronary artery bypass graft(s) without angina pectoris: Secondary | ICD-10-CM

## 2018-07-15 DIAGNOSIS — Z951 Presence of aortocoronary bypass graft: Secondary | ICD-10-CM | POA: Diagnosis not present

## 2018-07-15 DIAGNOSIS — I10 Essential (primary) hypertension: Secondary | ICD-10-CM

## 2018-07-15 NOTE — Progress Notes (Signed)
Virtual Visit via Telephone Note   This visit type was conducted due to national recommendations for restrictions regarding the COVID-19 Pandemic (e.g. social distancing) in an effort to limit this patient's exposure and mitigate transmission in our community.  Due to his co-morbid illnesses, this patient is at least at moderate risk for complications without adequate follow up.  This format is felt to be most appropriate for this patient at this time.  The patient did not have access to video technology/had technical difficulties with video requiring transitioning to audio format only (telephone).  All issues noted in this document were discussed and addressed.  No physical exam could be performed with this format.  Please refer to the patient's chart for his  consent to telehealth for [facility].   Evaluation Performed:  Follow-up visit  Date:  07/15/2018   ID:  GRACEN SOUTHWELL, DOB 1940-05-19, MRN [id number]  Patient Location: Home  Provider Location: Home  PCP:  Eulas Post, MD  Cardiologist:  Kenedee Molesky [place] MD Electrophysiologist:  None   Chief Complaint:  CAD  History of Present Illness:    TAHMIR KLECKNER is a 78 y.o. male who presents via audio/video conferencing for a telehealth visit today.  He has a history of DM, HTN, CRI, and PVD s/p remote AAA R&G, admitted 03/18/15 with a STEMI. At cath he had severe 3V and left main CAD. EF 20--25%. He went for urgent CABG. He tolerated this well. Post op he had PAF and was placed on Amiodarone. Amiodarone has since been discontinued. Follow up Echo in  2017showed EF 40-45%. He was placed on lisinopril but developed hyperkalemia so this was stopped. On his follow up we resumed at a lower dose and potassium levels have been normal.  On our visit today he reports he is doing very well this year. No new health concerns. Diabetes is under good control. Weight is actually down about 15 lbs which he attributes to eating well and staying  active. He has been stressed since his son had a stroke and they are very close.   The patient does not have symptoms concerning for COVID-19 infection (fever, chills, cough, or new shortness of breath).    Past Medical History:  Diagnosis Date  . Arthritis   . Brain aneurysm 2018   followed by Dr [name]  . Cataract   . Chronic kidney disease    kidney stone- 40 years ago  . Coronary artery disease   . Diabetes mellitus   . GERD (gastroesophageal reflux disease)   . Hx of abdominal aortic aneurysm 1998  . Hyperlipidemia   . Hypertension   . Myocardial infarction ([place])    2016  . Personal history of colonic polyps - adenomas 09/09/2013   Past Surgical History:  Procedure Laterality Date  . ABDOMINAL AORTIC ANEURYSM REPAIR  1998  . CARDIAC CATHETERIZATION N/A 03/18/2015   Procedure: Left Heart Cath and Coronary Angiography;  Surgeon: Foxx Klarich M Martinique, MD;  Location: [facility];  Service: Cardiovascular;  Laterality: N/A;  . cataract surgery Right   . COLONOSCOPY    . CORONARY ARTERY BYPASS GRAFT N/A 03/18/2015   Procedure: CORONARY ARTERY BYPASS GRAFTING (CABG) x  three, using left internal mammary artery and right leg greater saphenous vein harvested endoscopically;  Surgeon: Melrose Nakayama, MD;  Location: [facility];  Service: Open Heart Surgery;  Laterality: N/A;  . INGUINAL HERNIA REPAIR Bilateral   . INTRAOPERATIVE TRANSESOPHAGEAL ECHOCARDIOGRAM N/A 03/18/2015   Procedure:  INTRAOPERATIVE TRANSESOPHAGEAL ECHOCARDIOGRAM;  Surgeon: Melrose Nakayama, MD;  Location: [facility];  Service: Open Heart Surgery;  Laterality: N/A;  . open heart surgery  2016  . UMBILICAL HERNIA REPAIR  1965     Current Meds  Medication Sig  . aspirin EC 81 MG tablet Take 1 tablet (81 mg total) by mouth daily.  Marland Kitchen atorvastatin (LIPITOR) 80 MG tablet TAKE 1 TABLET (80 MG TOTAL) BY MOUTH DAILY AT 6 PM.  . carvedilol (COREG) 6.25 MG tablet TAKE 1 TABLET BY MOUTH TWICE A DAY  . EPINEPHrine 0.3 mg/0.3  mL IJ SOAJ injection Inject 0.3 mLs (0.3 mg total) into the muscle as needed (tongue swelling).  Marland Kitchen glucose blood (FREESTYLE LITE) test strip Check Blood Sugars 1-2 times per day. DX: E11.9.  . Lancets (FREESTYLE) lancets Check Blood Sugars 1-2 times per day. DX: E11.9  . levothyroxine (SYNTHROID, LEVOTHROID) 50 MCG tablet Take 1 tablet (50 mcg total) by mouth daily.  Marland Kitchen losartan (COZAAR) 100 MG tablet TAKE 1 TABLET BY MOUTH EVERY DAY  . metFORMIN (GLUCOPHAGE) 1000 MG tablet Take 1 tablet (1,000 mg total) by mouth 2 (two) times daily.  . Multiple Vitamin (MULTIVITAMIN) tablet Take 1 tablet by mouth daily. Centrium Silver once daily   . vitamin B-12 (CYANOCOBALAMIN) 1000 MCG tablet Take 1,000 mcg by mouth daily.  . vitamin C (ASCORBIC ACID) 500 MG tablet Take 500 mg by mouth daily.     Allergies:   Food; Shellfish allergy; Prednisone; and Venomil honey bee venom [honey bee venom]   Social History   Tobacco Use  . Smoking status: Former Smoker    Packs/day: 1.50    Years: 20.00    Pack years: 30.00    Types: Cigarettes    Last attempt to quit: 08/24/1986    Years since quitting: 31.9  . Smokeless tobacco: Never Used  Substance Use Topics  . Alcohol use: Yes    Comment: occ  . Drug use: No     Family Hx: The patient's family history includes Bone cancer in his mother; Diabetes in his paternal aunt; Down syndrome in his son; Stroke in his son. There is no history of Colon cancer, Esophageal cancer, Rectal cancer, or Stomach cancer.  ROS:   Please see the history of present illness.    All other systems reviewed and are negative.   Prior CV studies:   The following studies were reviewed today:  Echo 12/13/15: Study Conclusions  - Left ventricle: The cavity size was normal. Wall thickness was   increased in a pattern of mild LVH. Systolic function was mildly   to moderately reduced. The estimated ejection fraction was in the   range of 40% to 45%. Diffuse hypokinesis. There is  dyskinesis of   the basalinferolateral myocardium. Doppler parameters are   consistent with abnormal left ventricular relaxation (grade 1   diastolic dysfunction). - Mitral valve: Calcified annulus. - Left atrium: The atrium was mildly dilated. - Pulmonary arteries: Systolic pressure was mildly increased. PA   peak pressure: 32 mm Hg (S).  Impressions:  - Global hypokinesis and dyskinesis of the basal inferior lateral   wall; overall mild to moderate LV dysfunction; grade 1 diastolic   dysfunction; mild LAE; mild TR with mildly elevated pulmonary   pressure.  Labs/Other Tests and Data Reviewed:    EKG:  No ECG reviewed.  Recent Labs: 03/10/2018: ALT 13; BUN 23; Creatinine, Ser 1.08; Potassium 4.1; Sodium 141 05/19/2018: TSH 4.76   Recent Lipid Panel  Lab Results  Component Value Date/Time   CHOL 120 03/10/2018 02:27 PM   CHOL 116 03/14/2017 01:00 PM   TRIG 168.0 (H) 03/10/2018 02:27 PM   HDL 38.00 (L) 03/10/2018 02:27 PM   HDL 45 03/14/2017 01:00 PM   CHOLHDL 3 03/10/2018 02:27 PM   LDLCALC 48 03/10/2018 02:27 PM   LDLCALC 57 03/14/2017 01:00 PM   LDLDIRECT 84.1 05/21/2013 11:06 AM    Wt Readings from Last 3 Encounters:  07/15/18 150 lb (68 kg)  03/10/18 162 lb 9.6 oz (73.8 kg)  01/20/18 165 lb 14.4 oz (75.3 kg)     Objective:    Vital Signs:  BP (!) 147/89   Pulse 70   Ht 5\' 8"  (1.727 m)   Wt 150 lb (68 kg)   BMI 22.81 kg/m      ASSESSMENT & PLAN:    1. CAD s/p STEMI in December 2016. Urgent CABG for severe 3 vessel and left main disease. He is asymptomatic. Continue ASA to 81 mg daily, continue beta blocker, and statin.  2. Ischemic cardiomyopathy. EF improved to 40-45%. Well compensated. Will continue Coreg  6.25 mg bid and losartan.   3. Hyperlipidemia. On lipitor. Excellent control.  4. Post op Afib. Resolved. No recurrence  5. DM type 2 with renal manifestations. Last A1c 6.0%. 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 S/p AAA repair.     COVID-19 Education: The signs and symptoms of COVID-19 were discussed with the patient and how to seek care for testing (follow up with PCP or arrange E-visit).  The importance of social distancing was discussed today.  Time:   Today, I have spent 8 minutes with the patient with telehealth technology discussing the above problems.     Medication Adjustments/Labs and Tests Ordered: Current medicines are reviewed at length with the patient today.  Concerns regarding medicines are outlined above.  Tests Ordered: No orders of the defined types were placed in this encounter.  Medication Changes: No orders of the defined types were placed in this encounter.   Disposition:  Follow up in 1 year(s)  Signed, Ulyssa Walthour [place], MD  07/15/2018 3:23 PM    [place]

## 2018-07-15 NOTE — Patient Instructions (Signed)
Medication Instructions:  Continue same medications If you need a refill on your cardiac medications before your next appointment, please call your pharmacy.   Lab work: None ordered   Testing/Procedures: None ordered  Follow-Up: At [facility], you and your health needs are our priority.  As part of our continuing mission to provide you with exceptional heart care, we have created designated Provider Care Teams.  These Care Teams include your primary Cardiologist (physician) and Advanced Practice Providers (APPs -  Physician Assistants and Nurse Practitioners) who all work together to provide you with the care you need, when you need it. . Schedule follow up appointment with Dr.Jordan in 1 year     Call 3 months before to schedule

## 2018-07-22 ENCOUNTER — Other Ambulatory Visit: Payer: Self-pay

## 2018-07-22 ENCOUNTER — Ambulatory Visit (INDEPENDENT_AMBULATORY_CARE_PROVIDER_SITE_OTHER): Payer: PPO | Admitting: Family Medicine

## 2018-07-22 DIAGNOSIS — I10 Essential (primary) hypertension: Secondary | ICD-10-CM | POA: Diagnosis not present

## 2018-07-22 DIAGNOSIS — E039 Hypothyroidism, unspecified: Secondary | ICD-10-CM

## 2018-07-22 DIAGNOSIS — M79602 Pain in left arm: Secondary | ICD-10-CM

## 2018-07-22 DIAGNOSIS — E7800 Pure hypercholesterolemia, unspecified: Secondary | ICD-10-CM

## 2018-07-22 DIAGNOSIS — E1121 Type 2 diabetes mellitus with diabetic nephropathy: Secondary | ICD-10-CM | POA: Diagnosis not present

## 2018-07-22 NOTE — Progress Notes (Signed)
Patient ID: Miguel Vazquez, male   DOB: 03-23-1941, 78 y.o.   MRN: [id number]  Virtual Visit via Video Note  I connected with Miguel Vazquez on 07/22/18 at  3:00 PM EDT by a video enabled telemedicine application and verified that I am speaking with the correct person using two identifiers.  Location patient: home Location provider:work or home office Persons participating in the virtual visit: patient, provider  I discussed the limitations of evaluation and management by telemedicine and the availability of in person appointments. The patient expressed understanding and agreed to proceed.   HPI: Patient has history of coronary artery disease, hypertension, hypothyroidism, hyperlipidemia.  He had CABG back in 2016 and he had atrial fibrillation following his bypass procedure.  He has peripheral vascular disease with history of abdominal aortic aneurysm repair.  Generally doing well at this time.  He lives at home with his wife and his son who has Down syndrome.  They have been very involved with taking care of him recently and have had less help because of the current pandemic  Patient relates some left upper extremity pain which he thinks is related to lifting his son.  He has pain around the elbow and wrist region.  Denies any specific injury.  No swelling.  No ecchymosis.  No chest pain.  Type 2 diabetes.  Last A1c 6.0%.  Not monitoring blood sugars regularly.  No polyuria or polydipsia.  Hypertension history.  He has been monitoring blood pressures and they have been very stable.  Most of his systolic readings below 510.  He has hypothyroidism.  Recent TSH was improved at 4.76.  He states he is compliant with taking his thyroid medication regularly.  We will plan to get follow-up TSH around now.  No fatigue issues.   ROS: See pertinent positives and negatives per HPI.  Past Medical History:  Diagnosis Date  . Arthritis   . Brain aneurysm 2018   followed by Dr [name]  . Cataract   . Chronic  kidney disease    kidney stone- 40 years ago  . Coronary artery disease   . Diabetes mellitus   . GERD (gastroesophageal reflux disease)   . Hx of abdominal aortic aneurysm 1998  . Hyperlipidemia   . Hypertension   . Myocardial infarction ([place])    2016  . Personal history of colonic polyps - adenomas 09/09/2013    Past Surgical History:  Procedure Laterality Date  . ABDOMINAL AORTIC ANEURYSM REPAIR  1998  . CARDIAC CATHETERIZATION N/A 03/18/2015   Procedure: Left Heart Cath and Coronary Angiography;  Surgeon: Peter M Martinique, MD;  Location: [facility];  Service: Cardiovascular;  Laterality: N/A;  . cataract surgery Right   . COLONOSCOPY    . CORONARY ARTERY BYPASS GRAFT N/A 03/18/2015   Procedure: CORONARY ARTERY BYPASS GRAFTING (CABG) x  three, using left internal mammary artery and right leg greater saphenous vein harvested endoscopically;  Surgeon: Melrose Nakayama, MD;  Location: [facility];  Service: Open Heart Surgery;  Laterality: N/A;  . INGUINAL HERNIA REPAIR Bilateral   . INTRAOPERATIVE TRANSESOPHAGEAL ECHOCARDIOGRAM N/A 03/18/2015   Procedure: INTRAOPERATIVE TRANSESOPHAGEAL ECHOCARDIOGRAM;  Surgeon: Melrose Nakayama, MD;  Location: [facility];  Service: Open Heart Surgery;  Laterality: N/A;  . open heart surgery  2016  . UMBILICAL HERNIA REPAIR  1965    Family History  Problem Relation Age of Onset  . Bone cancer Mother   . Diabetes Paternal Aunt   . Stroke Son   .  Down syndrome Son   . Colon cancer Neg Hx   . Esophageal cancer Neg Hx   . Rectal cancer Neg Hx   . Stomach cancer Neg Hx     SOCIAL HX: Smoking 1988.  No regular alcohol use.   Current Outpatient Medications:  .  aspirin EC 81 MG tablet, Take 1 tablet (81 mg total) by mouth daily., Disp: 90 tablet, Rfl: 3 .  atorvastatin (LIPITOR) 80 MG tablet, TAKE 1 TABLET (80 MG TOTAL) BY MOUTH DAILY AT 6 PM., Disp: 90 tablet, Rfl: 2 .  carvedilol (COREG) 6.25 MG tablet, TAKE 1 TABLET BY MOUTH TWICE A DAY,  Disp: 180 tablet, Rfl: 0 .  EPINEPHrine 0.3 mg/0.3 mL IJ SOAJ injection, Inject 0.3 mLs (0.3 mg total) into the muscle as needed (tongue swelling)., Disp: 1 Device, Rfl: 2 .  glucose blood (FREESTYLE LITE) test strip, Check Blood Sugars 1-2 times per day. DX: E11.9., Disp: 100 each, Rfl: 5 .  Lancets (FREESTYLE) lancets, Check Blood Sugars 1-2 times per day. DX: E11.9, Disp: 100 each, Rfl: 5 .  levothyroxine (SYNTHROID, LEVOTHROID) 50 MCG tablet, Take 1 tablet (50 mcg total) by mouth daily., Disp: 90 tablet, Rfl: 3 .  losartan (COZAAR) 100 MG tablet, TAKE 1 TABLET BY MOUTH EVERY DAY, Disp: 90 tablet, Rfl: 3 .  metFORMIN (GLUCOPHAGE) 1000 MG tablet, Take 1 tablet (1,000 mg total) by mouth 2 (two) times daily., Disp: 180 tablet, Rfl: 3 .  Multiple Vitamin (MULTIVITAMIN) tablet, Take 1 tablet by mouth daily. Centrium Silver once daily , Disp: , Rfl:  .  vitamin B-12 (CYANOCOBALAMIN) 1000 MCG tablet, Take 1,000 mcg by mouth daily., Disp: , Rfl:  .  vitamin C (ASCORBIC ACID) 500 MG tablet, Take 500 mg by mouth daily., Disp: , Rfl:   EXAM:  VITALS per patient if applicable:  GENERAL: alert, oriented, appears well and in no acute distress  HEENT: atraumatic, conjunttiva clear, no obvious abnormalities on inspection of external nose and ears  NECK: normal movements of the head and neck  LUNGS: on inspection no signs of respiratory distress, breathing rate appears normal, no obvious gross SOB, gasping or wheezing  CV: no obvious cyanosis  MS: moves all visible extremities without noticeable abnormality  PSYCH/NEURO: pleasant and cooperative, no obvious depression or anxiety, speech and thought processing grossly intact  ASSESSMENT AND PLAN:  Discussed the following assessment and plan:  #1 type 2 diabetes.  History of good control.  Last A1c 6.0% -Hopefully can get back into the office within 3 to 4 months and repeat A1c then  #2 hypothyroidism-on replacement. -Needs repeat TSH when he  can get back in office - hopefully 3 months  #3 hypertension stable and controlled by home readings  #4 history of CAD-no recent chest pains  #5 left arm pain.  Sounds musculoskeletal.  Consistently worsened with lifting activities     I discussed the assessment and treatment plan with the patient. The patient was provided an opportunity to ask questions and all were answered. The patient agreed with the plan and demonstrated an understanding of the instructions.   The patient was advised to call back or seek an in-person evaluation if the symptoms worsen or if the condition fails to improve as anticipated.   Carolann Littler, MD

## 2018-07-31 ENCOUNTER — Other Ambulatory Visit: Payer: Self-pay | Admitting: Family Medicine

## 2018-07-31 DIAGNOSIS — E1121 Type 2 diabetes mellitus with diabetic nephropathy: Secondary | ICD-10-CM

## 2018-08-10 ENCOUNTER — Other Ambulatory Visit: Payer: Self-pay | Admitting: Cardiology

## 2018-08-11 NOTE — Telephone Encounter (Signed)
Carvedilol refilled.

## 2018-09-15 ENCOUNTER — Ambulatory Visit: Payer: PPO

## 2018-10-16 ENCOUNTER — Ambulatory Visit: Payer: PPO

## 2018-10-29 ENCOUNTER — Telehealth: Payer: Self-pay | Admitting: Family Medicine

## 2018-10-29 DIAGNOSIS — Z91018 Allergy to other foods: Secondary | ICD-10-CM

## 2018-10-29 NOTE — Telephone Encounter (Signed)
Copied from [place] [id number]. Topic: General - Other >> Oct 29, 2018  4:31 PM Keene Breath wrote: Reason for CRM: Patient's wife called and would like for the nurse or doctor to send an order for an Epipen for patient.  Please advise and call patient to let him know if this is possible.  CB# [phone]

## 2018-10-30 MED ORDER — EPINEPHRINE 0.3 MG/0.3ML IJ SOAJ: 0.3000 mg | INTRAMUSCULAR | 3 refills | Status: DC | PRN

## 2018-10-30 NOTE — Telephone Encounter (Signed)
Rx sent to pharmacy   

## 2018-11-05 ENCOUNTER — Telehealth (INDEPENDENT_AMBULATORY_CARE_PROVIDER_SITE_OTHER): Payer: PPO | Admitting: Family Medicine

## 2018-11-05 ENCOUNTER — Other Ambulatory Visit: Payer: Self-pay

## 2018-11-05 ENCOUNTER — Telehealth: Payer: Self-pay | Admitting: Family Medicine

## 2018-11-05 DIAGNOSIS — I10 Essential (primary) hypertension: Secondary | ICD-10-CM

## 2018-11-05 DIAGNOSIS — E7800 Pure hypercholesterolemia, unspecified: Secondary | ICD-10-CM | POA: Diagnosis not present

## 2018-11-05 DIAGNOSIS — E1121 Type 2 diabetes mellitus with diabetic nephropathy: Secondary | ICD-10-CM | POA: Diagnosis not present

## 2018-11-05 DIAGNOSIS — E039 Hypothyroidism, unspecified: Secondary | ICD-10-CM

## 2018-11-05 NOTE — Telephone Encounter (Signed)
Please see message. °

## 2018-11-05 NOTE — Telephone Encounter (Signed)
We ordered A1C for labs Friday and will adjust meds if indicated.

## 2018-11-05 NOTE — Telephone Encounter (Signed)
Patient wanted to inform PCP Blood Sugar reading was taken at 10:30am prior to eating reading was 225.

## 2018-11-05 NOTE — Telephone Encounter (Signed)
See note

## 2018-11-05 NOTE — Progress Notes (Signed)
This visit type was conducted due to national recommendations for restrictions regarding the COVID-19 pandemic in an effort to limit this patient's exposure and mitigate transmission in our community.   Virtual Visit via Telephone Note  I connected with Miguel Vazquez on 11/05/18 at 11:30 AM EDT by telephone and verified that I am speaking with the correct person using two identifiers.   I discussed the limitations, risks, security and privacy concerns of performing an evaluation and management service by telephone and the availability of in person appointments. I also discussed with the patient that there may be a patient responsible charge related to this service. The patient expressed understanding and agreed to proceed.  Location patient: home Location provider: work or home office Participants present for the call: patient, provider Patient did not have a visit in the prior 7 days to address this/these issue(s).   History of Present Illness: Patient has chronic problems including type 2 diabetes, history of CAD, hypothyroidism, hypertension, history of abdominal aortic aneurysm repair.  He states he is doing generally fairly well.  He had mildly elevated TSH last year and we titrated up his levothyroxine.  He is due for follow-up labs.  Last A1c 6.0%.  Due for follow-up.  Not monitoring blood sugars regularly.  No polyuria or polydipsia.  He states he is compliant with all medications.  He takes Lipitor for hyperlipidemia.  Denies any recent dizziness, headaches, dyspnea, cough, fever, or any chest pain.  Lives with his wife and son who has Down syndrome.  He has lots of needs which keeps him very busy.   Observations/Objective: Patient sounds cheerful and well on the phone. I do not appreciate any SOB. Speech and thought processing are grossly intact. Patient reported vitals:  Assessment and Plan:  #1 type 2 diabetes.  History of good control -Patient will come in Friday for labs  including A1c. -Continue yearly eye exam  #2 hypothyroidism. -Recheck TSH this Friday for labs and adjust medication accordingly  #3 hypertension.  Blood pressure reading this morning at home 128/83 -Continue current medications  #4 hyperlipidemia.  Has history of CAD.  Goal LDL less than 70 -Recheck fasting lipids and hepatic this Friday  Follow Up Instructions:  -For labs this Friday as above   99441 5-10 99442 11-20 99443 21-30 I did not refer this patient for an OV in the next 24 hours for this/these issue(s).  I discussed the assessment and treatment plan with the patient. The patient was provided an opportunity to ask questions and all were answered. The patient agreed with the plan and demonstrated an understanding of the instructions.   The patient was advised to call back or seek an in-person evaluation if the symptoms worsen or if the condition fails to improve as anticipated.  I provided 24 minutes of non-face-to-face time during this encounter.   Carolann Littler, MD

## 2018-11-07 ENCOUNTER — Other Ambulatory Visit (INDEPENDENT_AMBULATORY_CARE_PROVIDER_SITE_OTHER): Payer: PPO

## 2018-11-07 ENCOUNTER — Other Ambulatory Visit: Payer: Self-pay

## 2018-11-07 DIAGNOSIS — E1121 Type 2 diabetes mellitus with diabetic nephropathy: Secondary | ICD-10-CM

## 2018-11-07 LAB — LIPID PANEL
Cholesterol: 117 mg/dL (ref 0–200)
HDL: 39.5 mg/dL (ref >39.00)
LDL Cholesterol: 59 mg/dL (ref 0–99)
NonHDL: 77.95
Total CHOL/HDL Ratio: 3
Triglycerides: 97 mg/dL (ref 0.0–149.0)
VLDL: 19.4 mg/dL (ref 0.0–40.0)

## 2018-11-07 LAB — HEPATIC FUNCTION PANEL
ALT: 15 U/L (ref 0–53)
AST: 16 U/L (ref 0–37)
Albumin: 4.2 g/dL (ref 3.5–5.2)
Alkaline Phosphatase: 54 U/L (ref 39–117)
Bilirubin, Direct: 0.1 mg/dL (ref 0.0–0.3)
Total Bilirubin: 0.7 mg/dL (ref 0.2–1.2)
Total Protein: 6.6 g/dL (ref 6.0–8.3)

## 2018-11-07 LAB — TSH: TSH: 4.9 u[IU]/mL — ABNORMAL HIGH (ref 0.35–4.50)

## 2018-11-07 LAB — BASIC METABOLIC PANEL
BUN: 27 mg/dL — ABNORMAL HIGH (ref 6–23)
CO2: 30 mEq/L (ref 19–32)
Calcium: 10.4 mg/dL (ref 8.4–10.5)
Chloride: 103 mEq/L (ref 96–112)
Creatinine, Ser: 1.09 mg/dL (ref 0.40–1.50)
GFR: 65.63 mL/min (ref >60.00)
Glucose, Bld: 132 mg/dL — ABNORMAL HIGH (ref 70–99)
Potassium: 4.9 mEq/L (ref 3.5–5.1)
Sodium: 141 mEq/L (ref 135–145)

## 2018-11-07 LAB — HEMOGLOBIN A1C: Hgb A1c MFr Bld: 6.5 % (ref 4.6–6.5)

## 2018-11-10 ENCOUNTER — Other Ambulatory Visit: Payer: Self-pay

## 2018-11-10 DIAGNOSIS — R7989 Other specified abnormal findings of blood chemistry: Secondary | ICD-10-CM

## 2018-11-10 MED ORDER — LEVOTHYROXINE SODIUM 75 MCG PO TABS: 75.0000 ug | ORAL_TABLET | Freq: Every day | ORAL | 1 refills | Status: DC

## 2018-12-18 ENCOUNTER — Other Ambulatory Visit: Payer: Self-pay | Admitting: Family Medicine

## 2018-12-18 DIAGNOSIS — E785 Hyperlipidemia, unspecified: Secondary | ICD-10-CM

## 2018-12-26 ENCOUNTER — Telehealth: Payer: Self-pay | Admitting: Family Medicine

## 2018-12-26 NOTE — Telephone Encounter (Signed)
8.19.20 pt had an assessment and failed. Pt isn't checking his blood sugar with other things. Vinnie Level would like a call back from nurse to discuss. Please leave message this is a confidential vm.

## 2019-01-22 DIAGNOSIS — E119 Type 2 diabetes mellitus without complications: Secondary | ICD-10-CM | POA: Diagnosis not present

## 2019-01-22 NOTE — Telephone Encounter (Signed)
Please clarify.  Not sure what is meant in this note by "had an assessment and failed".    Not sure what to do with this note.

## 2019-01-29 NOTE — Telephone Encounter (Signed)
Attempted to reach nurse, no answer. Left a detailed vm

## 2019-02-03 DIAGNOSIS — H26492 Other secondary cataract, left eye: Secondary | ICD-10-CM | POA: Diagnosis not present

## 2019-02-03 DIAGNOSIS — Z961 Presence of intraocular lens: Secondary | ICD-10-CM | POA: Diagnosis not present

## 2019-02-03 DIAGNOSIS — H26493 Other secondary cataract, bilateral: Secondary | ICD-10-CM | POA: Diagnosis not present

## 2019-02-03 DIAGNOSIS — H18413 Arcus senilis, bilateral: Secondary | ICD-10-CM | POA: Diagnosis not present

## 2019-02-03 DIAGNOSIS — H26491 Other secondary cataract, right eye: Secondary | ICD-10-CM | POA: Diagnosis not present

## 2019-02-03 DIAGNOSIS — E119 Type 2 diabetes mellitus without complications: Secondary | ICD-10-CM | POA: Diagnosis not present

## 2019-02-07 ENCOUNTER — Other Ambulatory Visit: Payer: Self-pay | Admitting: Family Medicine

## 2019-02-09 ENCOUNTER — Other Ambulatory Visit (INDEPENDENT_AMBULATORY_CARE_PROVIDER_SITE_OTHER): Payer: PPO

## 2019-02-09 ENCOUNTER — Other Ambulatory Visit: Payer: Self-pay

## 2019-02-09 DIAGNOSIS — R7989 Other specified abnormal findings of blood chemistry: Secondary | ICD-10-CM

## 2019-02-09 LAB — TSH: TSH: 2.59 u[IU]/mL (ref 0.35–4.50)

## 2019-02-16 ENCOUNTER — Emergency Department (HOSPITAL_BASED_OUTPATIENT_CLINIC_OR_DEPARTMENT_OTHER)
Admission: EM | Admit: 2019-02-16 | Discharge: 2019-02-16 | Disposition: A | Discharge status: Home / Self Care | Payer: PPO | Attending: Emergency Medicine | Admitting: Emergency Medicine

## 2019-02-16 ENCOUNTER — Encounter (HOSPITAL_BASED_OUTPATIENT_CLINIC_OR_DEPARTMENT_OTHER): Payer: Self-pay

## 2019-02-16 ENCOUNTER — Emergency Department (HOSPITAL_BASED_OUTPATIENT_CLINIC_OR_DEPARTMENT_OTHER): Payer: PPO

## 2019-02-16 ENCOUNTER — Other Ambulatory Visit: Payer: Self-pay

## 2019-02-16 DIAGNOSIS — S0990XA Unspecified injury of head, initial encounter: Secondary | ICD-10-CM

## 2019-02-16 DIAGNOSIS — Z7984 Long term (current) use of oral hypoglycemic drugs: Secondary | ICD-10-CM | POA: Insufficient documentation

## 2019-02-16 DIAGNOSIS — I10 Essential (primary) hypertension: Secondary | ICD-10-CM | POA: Insufficient documentation

## 2019-02-16 DIAGNOSIS — E119 Type 2 diabetes mellitus without complications: Secondary | ICD-10-CM | POA: Insufficient documentation

## 2019-02-16 DIAGNOSIS — Y929 Unspecified place or not applicable: Secondary | ICD-10-CM | POA: Insufficient documentation

## 2019-02-16 DIAGNOSIS — I252 Old myocardial infarction: Secondary | ICD-10-CM | POA: Insufficient documentation

## 2019-02-16 DIAGNOSIS — S40211A Abrasion of right shoulder, initial encounter: Secondary | ICD-10-CM | POA: Diagnosis not present

## 2019-02-16 DIAGNOSIS — Z87891 Personal history of nicotine dependence: Secondary | ICD-10-CM | POA: Diagnosis not present

## 2019-02-16 DIAGNOSIS — Z79899 Other long term (current) drug therapy: Secondary | ICD-10-CM | POA: Insufficient documentation

## 2019-02-16 DIAGNOSIS — I2510 Atherosclerotic heart disease of native coronary artery without angina pectoris: Secondary | ICD-10-CM | POA: Diagnosis not present

## 2019-02-16 DIAGNOSIS — Y9389 Activity, other specified: Secondary | ICD-10-CM | POA: Insufficient documentation

## 2019-02-16 DIAGNOSIS — S0003XA Contusion of scalp, initial encounter: Secondary | ICD-10-CM | POA: Diagnosis not present

## 2019-02-16 DIAGNOSIS — Z951 Presence of aortocoronary bypass graft: Secondary | ICD-10-CM | POA: Diagnosis not present

## 2019-02-16 DIAGNOSIS — Y999 Unspecified external cause status: Secondary | ICD-10-CM | POA: Diagnosis not present

## 2019-02-16 DIAGNOSIS — E782 Mixed hyperlipidemia: Secondary | ICD-10-CM | POA: Diagnosis not present

## 2019-02-16 DIAGNOSIS — S199XXA Unspecified injury of neck, initial encounter: Secondary | ICD-10-CM | POA: Diagnosis not present

## 2019-02-16 DIAGNOSIS — Z23 Encounter for immunization: Secondary | ICD-10-CM | POA: Insufficient documentation

## 2019-02-16 DIAGNOSIS — W010XXA Fall on same level from slipping, tripping and stumbling without subsequent striking against object, initial encounter: Secondary | ICD-10-CM | POA: Insufficient documentation

## 2019-02-16 DIAGNOSIS — Z7982 Long term (current) use of aspirin: Secondary | ICD-10-CM | POA: Diagnosis not present

## 2019-02-16 DIAGNOSIS — W19XXXA Unspecified fall, initial encounter: Secondary | ICD-10-CM

## 2019-02-16 IMAGING — CT CT CERVICAL SPINE W/O CM
3 of 6 series · 12 of 33 positions shown, 13 images · non-contrast
Comparison: CT head [DATE], MR/MRA head [DATE]

CLINICAL DATA: Posterior scalp hematoma, fell while loading truck,
no loss of consciousness, no anticoagulation

EXAM:
CT HEAD WITHOUT CONTRAST
CT CERVICAL SPINE WITHOUT CONTRAST
TECHNIQUE: Multidetector CT imaging of the head and cervical spine was
performed following the standard protocol without intravenous
contrast. Multiplanar CT image reconstructions of the cervical spine
were also generated.

[Series 4: head 3.0 mpr cor · coronal · 0.32mm/px · 3 of 72 slices shown]
[im 15/72  bone]
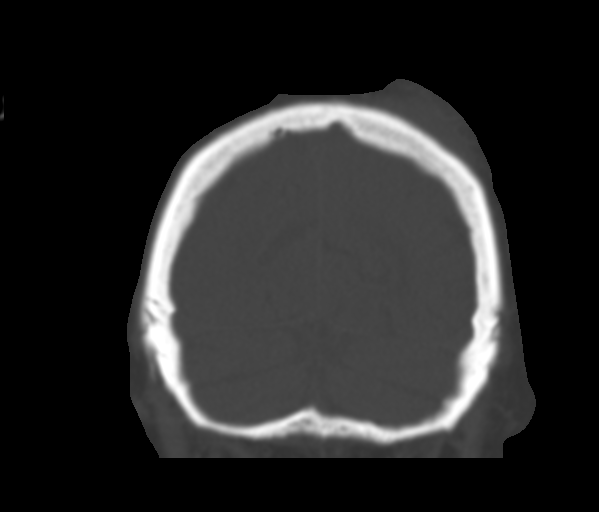
[im 29/72  bone]
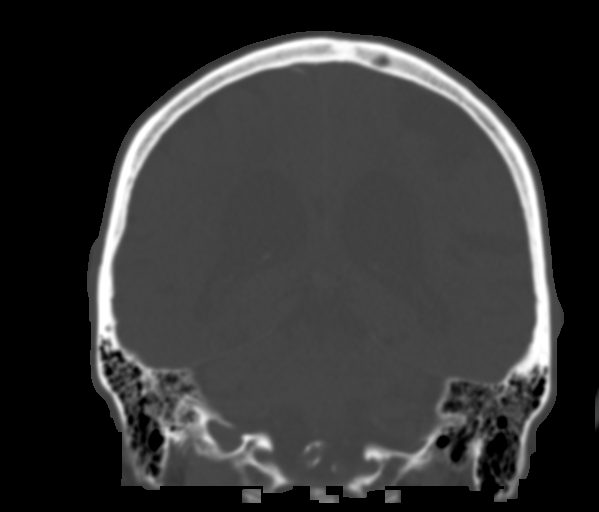
[im 43/72  bone]
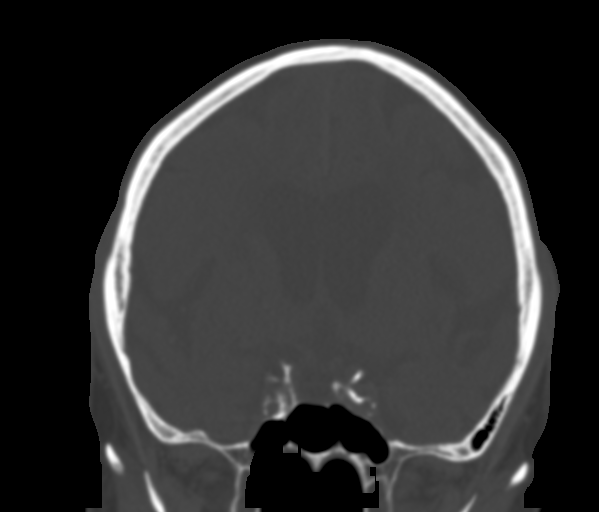

[Series 7: c_spine 2.0 i30s 3 · axial · 0.43mm/px · z∈[+1096,+1192]mm · 4 of 82 slices shown, 5 images]
[im 17/82  soft-tissue]
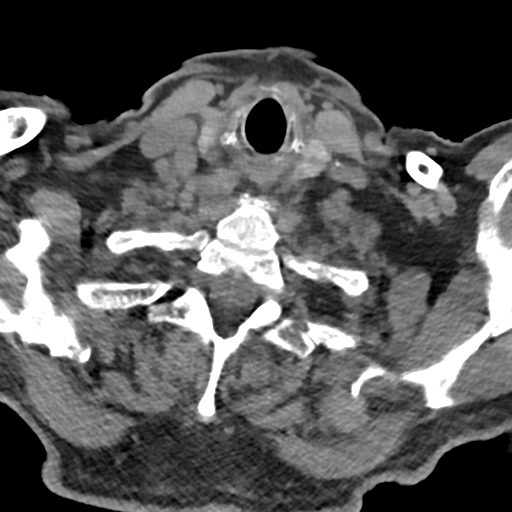
[im 17/82  bone]
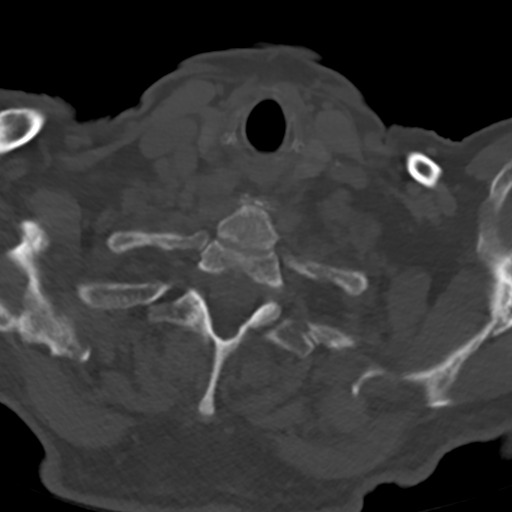
[im 33/82  bone]
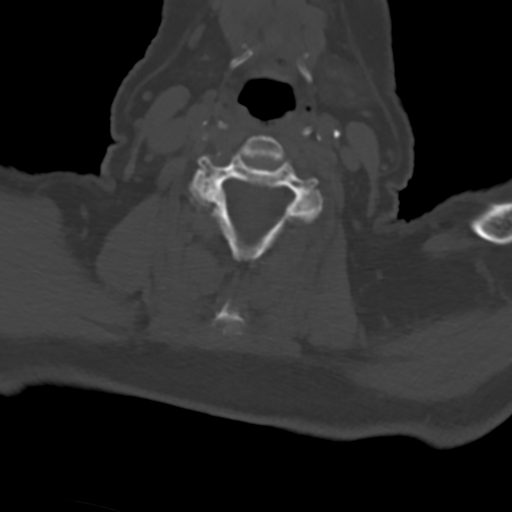
[im 49/82  bone]
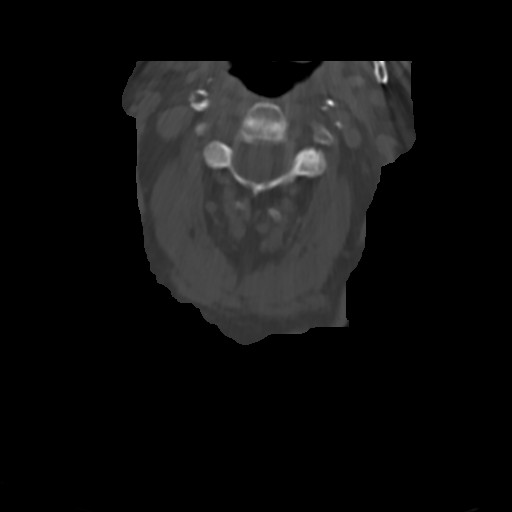
[im 65/82  bone]
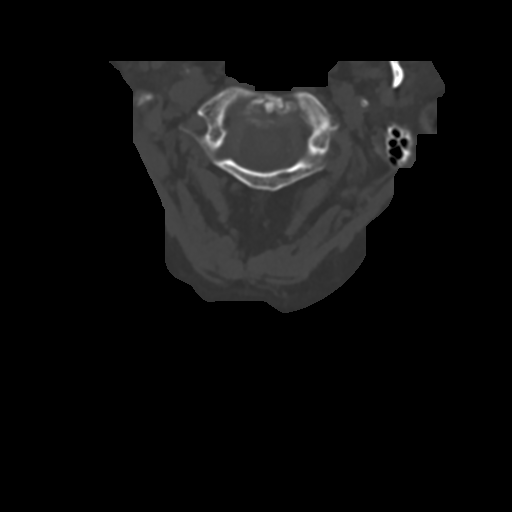

[Series 10: sagittals · sagittal · 0.26mm/px · 5 of 86 slices shown]
[im 15/86  bone]
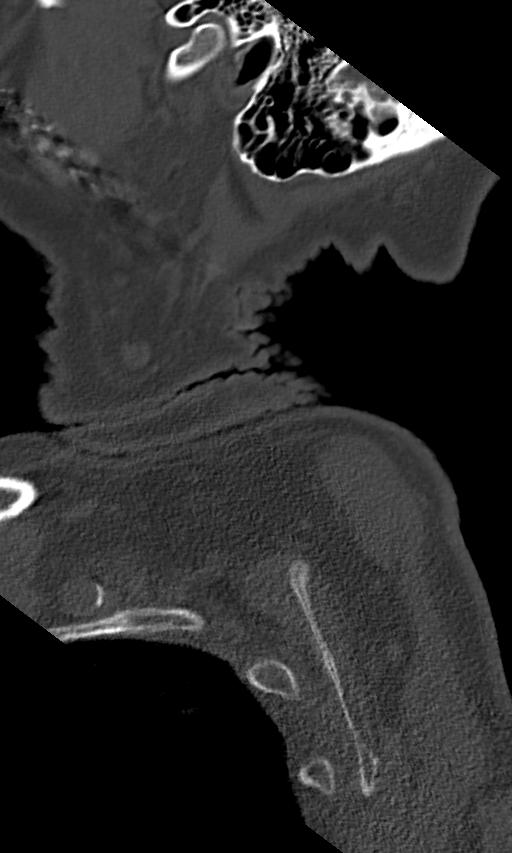
[im 29/86  bone]
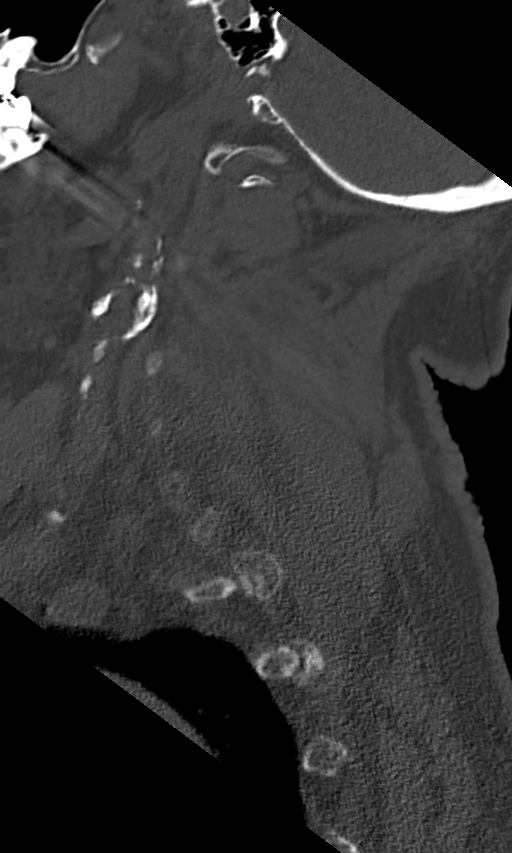
[im 43/86  bone]
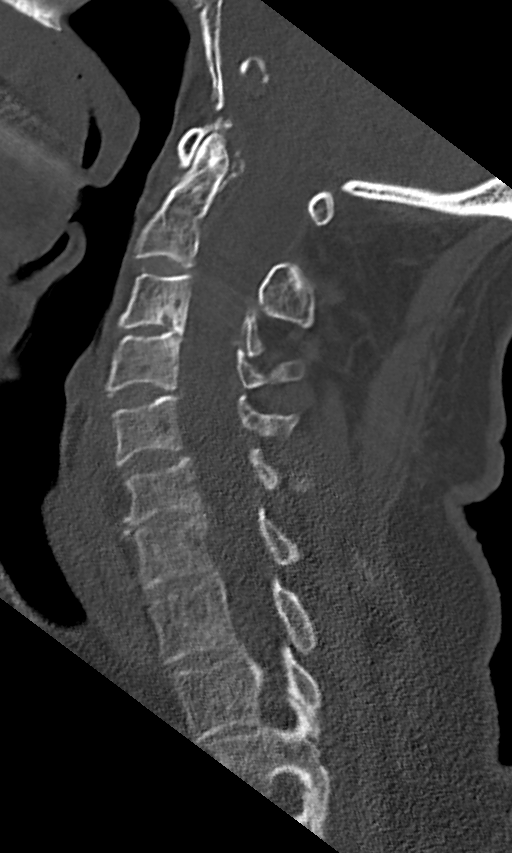
[im 57/86  bone]
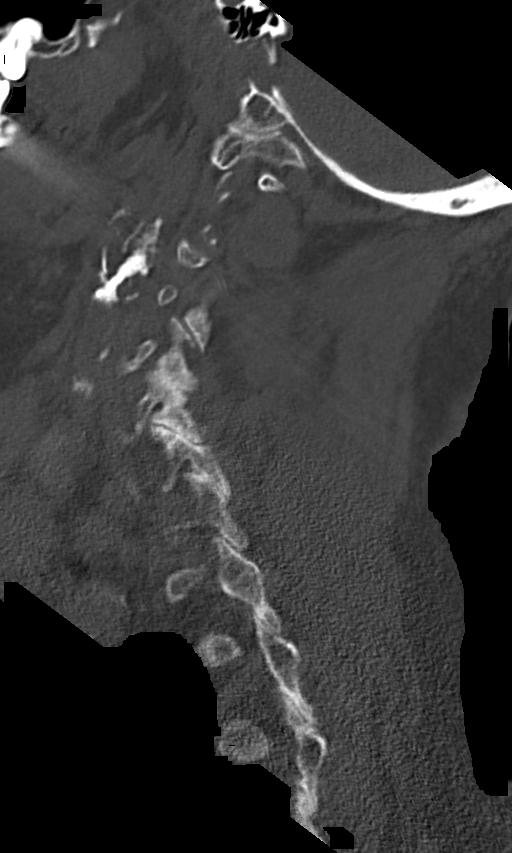
[im 71/86  bone]
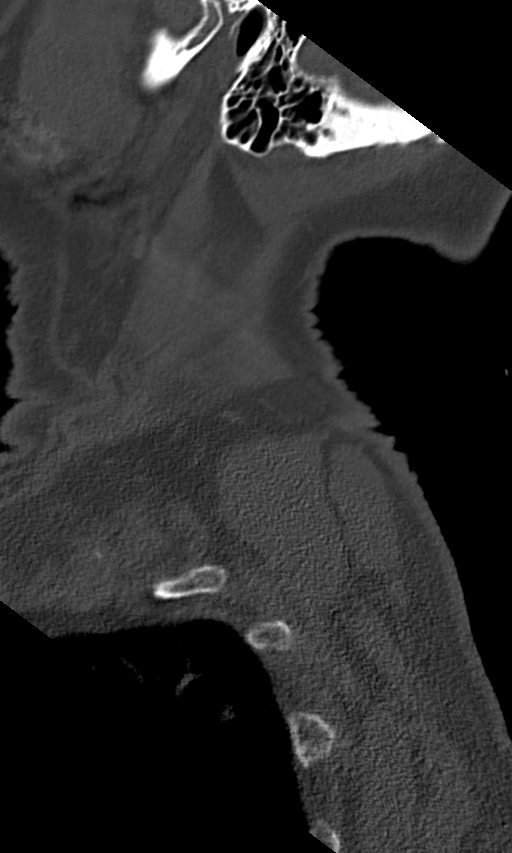

[12 of 33 positions shown; findings below may reference images not displayed]

FINDINGS: CT HEAD FINDINGS

Brain: No evidence of acute infarction, hemorrhage, extra-axial
collection or mass lesion/mass effect. Symmetric prominence of the
ventricles, cisterns and sulci compatible with frontal lobe
predominant parenchymal volume loss. Confluent areas of
periventricular white matter hypoattenuation are most compatible
with chronic microvascular angiopathy.

Vascular: Atherosclerotic calcification of the carotid siphons and
intradural vertebral arteries. Calcified fusiform ectatic segment of
the basilar artery is noted, similar to prior. No hyperdense vessel.

Skull: There is a high left parieto-occipital scalp contusion with a
subgaleal hematoma measuring up to 9 mm in maximal thickness no
subjacent calvarial fracture.

Sinuses/Orbits: Paranasal sinuses and mastoid air cells are
predominantly clear. Right nasal septal deviation with a right-sided
nasal septal spur. Orbital structures are unremarkable aside from
prior lens extractions.

Other: None

CT CERVICAL SPINE FINDINGS

Alignment: Preservation of the normal cervical lordosis without
traumatic listhesis. No abnormal facet widening. Normal alignment of
the craniocervical and atlantoaxial articulations.

Skull base and vertebrae: No acute fracture. No primary bone lesion
or focal pathologic process.

Soft tissues and spinal canal: No pre or paravertebral fluid or
swelling. No visible canal hematoma. Calcified pannus formation
posterior to the dens.

Disc levels: Multilevel intervertebral disc height loss with
spondylitic endplate changes. Posterior facet hypertrophic changes
and uncinate spurring are present as well. Features are maximal at
C3-4 and C6-7 levels with at most mild canal stenosis and mild
bilateral foraminal narrowing.

Upper chest: No acute abnormality in the upper chest or imaged lung
apices.

Other: Normal thyroid gland. Atherosclerotic calcification in the
aortic arch, cervical vertebral and carotid arteries.
IMPRESSION: 1. No acute intracranial abnormality.
2. Frontal predominant parenchymal volume loss and chronic white
matter angiopathy changes, similar to priors.
3. High left parieto-occipital scalp contusion with a subgaleal
hematoma measuring up to 9 mm in maximal thickness. No subjacent
calvarial fracture.
4. Calcified ectatic segment of the basilar artery, similar to
priors.
5. No acute cervical spine fracture or traumatic listhesis.
6. Moderate multilevel degenerative changes throughout the cervical
spine, detailed above.
7. Aortic Atherosclerosis ([AQ]-[AQ]). Cervical and intracranial
carotid and vertebral artery atherosclerosis as well.

## 2019-02-16 MED ORDER — TETANUS-DIPHTH-ACELL PERTUSSIS 5-2.5-18.5 LF-MCG/0.5 IM SUSP
0.5000 mL | Freq: Once | INTRAMUSCULAR | Status: AC
  Administered 2019-02-16: 0.5 mL via INTRAMUSCULAR
  Filled 2019-02-16: qty 0.5

## 2019-02-16 NOTE — ED Triage Notes (Signed)
Pt arrived from home with wife with large hematoma to posterior head. Wife reports that he was loading some things into a truck and "got off balance" falling and hitting his head. Wife denies LOC. Pt is not on a blood thinner.

## 2019-02-16 NOTE — Discharge Instructions (Signed)
You have been diagnosed today with Fall with head injury and contusion of the scalp and abrasion of right shoulder.  At this time there does not appear to be the presence of an emergent medical condition, however there is always the potential for conditions to change. Please read and follow the below instructions.  Please return to the Emergency Department immediately for any new or worsening symptoms. Please be sure to follow up with your Primary Care Provider within one week regarding your visit today; please call their office to schedule an appointment even if you are feeling better for a follow-up visit. Please continue to use cool compresses on your contusion to help with pain and swelling.  Continue to use antibiotic ointment and clean sterile dressings on your abrasions on your scalp and right shoulder.  Rinse the area gently with clean soapy water twice daily.  Schedule a follow-up appointment with your primary care provider tomorrow morning for recheck this week. I have copy and pasted your CT scan results at the bottom of section, please discuss the impression with your primary care provider in the incidental findings at your next visit.  CT head/c-spine:    IMPRESSION:  1. No acute intracranial abnormality.  2. Frontal predominant parenchymal volume loss and chronic white  matter angiopathy changes, similar to priors.  3. High left parieto-occipital scalp contusion with a subgaleal  hematoma measuring up to 9 mm in maximal thickness. No subjacent  calvarial fracture.  4. Calcified ectatic segment of the basilar artery, similar to  priors.  5. No acute cervical spine fracture or traumatic listhesis.  6. Moderate multilevel degenerative changes throughout the cervical  spine, detailed above.  7. Aortic Atherosclerosis (ICD10-I70.0). Cervical and intracranial  carotid and vertebral artery atherosclerosis as well.    Please read the additional information packets attached to your  discharge summary.  Do not take your medicine if  develop an itchy rash, swelling in your mouth or lips, or difficulty breathing; call 911 and seek immediate emergency medical attention if this occurs.  Note: Portions of this text may have been transcribed using voice recognition software. Every effort was made to ensure accuracy; however, inadvertent computerized transcription errors may still be present.

## 2019-02-16 NOTE — ED Provider Notes (Signed)
[facility] EMERGENCY DEPARTMENT Provider Note   CSN: [id number] Arrival date & time: 02/16/19  1516     History   Chief Complaint Chief Complaint  Patient presents with   Fall    HPI Miguel Vazquez is a 78 y.o. male presents today following fall and striking the back of his head on the ground.  Patient reports that he was loading a truck with a wet bag when he slipped losing his balance and falling backwards.  He reports that he struck the back of his head on the ground reports that he initially had a throbbing pain to the area mild nonradiating which gradually resolved.  He reports that he is now feeling well and has no complaints.  Large hematoma noted to the occipital scalp with some amount of bleeding.  Denies loss of consciousness, headache, blood thinner use, vision changes, neck pain, chest pain, shortness of breath, back pain, pain of the extremities, numbness/tingling, weakness, bowel/bladder incontinence, urinary retention, nausea/vomiting or any additional concerns today.  81 mg aspirin, no additional.    HPI  Past Medical History:  Diagnosis Date   Arthritis    Brain aneurysm 2018   followed by Dr [name]   Cataract    Chronic kidney disease    kidney stone- 40 years ago   Coronary artery disease    Diabetes mellitus    GERD (gastroesophageal reflux disease)    Hx of abdominal aortic aneurysm 1998   Hyperlipidemia    Hypertension    Myocardial infarction ([place])    2016   Personal history of colonic polyps - adenomas 09/09/2013    Patient Active Problem List   Diagnosis Date Noted   Episode of confusion 03/14/2017   Memory loss 03/14/2017   Hypothyroidism 02/20/2017   Pressure injury of skin 09/03/2016   Angioedema 09/02/2016   Cerebral atherosclerosis 01/18/2016   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   Headache 01/06/2016   Coronary artery disease involving coronary bypass graft of native heart without angina pectoris  12/22/2015   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   Cardiomyopathy, 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   Scrotal inguinal hernia s/p lap repair w mesh 03/15/2015 03/21/2015   STEMI 03/18/15 03/18/2015   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 03/18/2015   Obesity (BMI 30-39.9) 11/18/2012   History of AAA (abdominal aortic aneurysm) repair 11/18/2012   BACK PAIN, UPPER 08/10/2009   Type 2 diabetes mellitus with diabetic nephropathy, without long-term current use of insulin ([place]) 10/15/2008   Hyperlipidemia 10/15/2008   Essential hypertension 10/15/2008    Past Surgical History:  Procedure Laterality Date   ABDOMINAL AORTIC ANEURYSM REPAIR  1998   CARDIAC CATHETERIZATION N/A 03/18/2015   Procedure: Left Heart Cath and Coronary Angiography;  Surgeon: Peter M Martinique, MD;  Location: [facility];  Service: Cardiovascular;  Laterality: N/A;   cataract surgery Right    COLONOSCOPY     CORONARY ARTERY BYPASS GRAFT N/A 03/18/2015   Procedure: CORONARY ARTERY BYPASS GRAFTING (CABG) x  three, using left internal mammary artery and right leg greater saphenous vein harvested endoscopically;  Surgeon: Melrose Nakayama, MD;  Location: [facility];  Service: Open Heart Surgery;  Laterality: N/A;   INGUINAL HERNIA REPAIR Bilateral    INTRAOPERATIVE TRANSESOPHAGEAL ECHOCARDIOGRAM N/A 03/18/2015   Procedure: INTRAOPERATIVE TRANSESOPHAGEAL ECHOCARDIOGRAM;  Surgeon: Melrose Nakayama, MD;  Location: [facility];  Service: Open Heart Surgery;  Laterality: N/A;   open heart surgery  [id number]   [place]  Home Medications    Prior to Admission medications   Medication Sig Start Date End Date Taking? Authorizing Provider  aspirin EC 81 MG tablet Take 1 tablet (81 mg total) by mouth daily. 06/07/17   Martinique, Peter M, MD  atorvastatin (LIPITOR) 80 MG tablet TAKE 1 TABLET (80 MG TOTAL) BY MOUTH DAILY AT 6 PM. 12/19/18   [name], [name], MD  carvedilol (COREG) 6.25 MG tablet TAKE 1 TABLET BY MOUTH TWICE A  DAY 08/11/18   Martinique, Peter M, MD  EPINEPHrine 0.3 mg/0.3 mL IJ SOAJ injection Inject 0.3 mLs (0.3 mg total) into the muscle as needed for anaphylaxis. 10/30/18   [name], [name], MD  glucose blood (FREESTYLE LITE) test strip Check Blood Sugars 1-2 times per day. DX: E11.9. 01/10/16   [name], [name], MD  Lancets (FREESTYLE) lancets Check Blood Sugars 1-2 times per day. DX: E11.9 01/10/16   [name], [name], MD  levothyroxine (SYNTHROID) 75 MCG tablet Take 1 tablet (75 mcg total) by mouth daily. 11/10/18   [name], [name], MD  losartan (COZAAR) 100 MG tablet TAKE 1 TABLET BY MOUTH EVERY DAY 02/07/19   [name], [name], MD  metFORMIN (GLUCOPHAGE) 1000 MG tablet TAKE 1 TABLET BY MOUTH TWICE A DAY 08/01/18   [name], [name], MD  Multiple Vitamin (MULTIVITAMIN) tablet Take 1 tablet by mouth daily. Centrium Silver once daily     [provider]  vitamin B-12 (CYANOCOBALAMIN) 1000 MCG tablet Take 1,000 mcg by mouth daily.    [provider]  vitamin C (ASCORBIC ACID) 500 MG tablet Take 500 mg by mouth daily.    [provider]    Family History Family History  Problem Relation Age of Onset   Bone cancer Mother    Diabetes Paternal Aunt    Stroke Son    Down syndrome Son    Colon cancer Neg Hx    Esophageal cancer Neg Hx    Rectal cancer Neg Hx    Stomach cancer Neg Hx     Social History Social History   Tobacco Use   Smoking status: Former Smoker    Packs/day: 1.50    Years: 20.00    Pack years: 30.00    Types: Cigarettes    Quit date: 08/24/1986    Years since quitting: 32.5   Smokeless tobacco: Never Used  Substance Use Topics   Alcohol use: Yes    Comment: occ   Drug use: No     Allergies   Food, Shellfish allergy, Prednisone, and Venomil honey bee venom [honey bee venom]   Review of Systems Review of Systems Ten systems are reviewed and are negative for acute change except as noted in the HPI   Physical Exam Updated  Vital Signs BP (!) 155/85 (BP Location: Left Arm)    Pulse 70    Temp 99.3 F (37.4 C) (Oral)    Resp 18    Ht 5\' 8"  (1.727 m)    Wt 66.2 kg    SpO2 97%    BMI 22.20 kg/m   Physical Exam Constitutional:      General: He is not in acute distress.    Appearance: Normal appearance. He is well-developed. He is not ill-appearing or diaphoretic.  HENT:     Head: Normocephalic and atraumatic. No raccoon eyes or Battle's sign.     Jaw: There is normal jaw occlusion. No trismus.      Comments: Large hematoma of the occipital scalp.  Right Ear: External ear normal. No hemotympanum.     Left Ear: External ear normal. No hemotympanum.     Nose: Nose normal. No rhinorrhea.     Right Nostril: No epistaxis.     Left Nostril: No epistaxis.  Eyes:     General: Vision grossly intact. Gaze aligned appropriately.     Extraocular Movements: Extraocular movements intact.     Conjunctiva/sclera: Conjunctivae normal.     Pupils: Pupils are equal, round, and reactive to light.  Neck:     Musculoskeletal: Full passive range of motion without pain, normal range of motion and neck supple. No spinous process tenderness or muscular tenderness.     Trachea: Trachea and phonation normal. No tracheal tenderness or tracheal deviation.  Pulmonary:     Effort: Pulmonary effort is normal. No accessory muscle usage or respiratory distress.     Breath sounds: Normal breath sounds and air entry.  Chest:     Chest wall: No deformity, tenderness or crepitus.  Abdominal:     General: There is no distension.     Palpations: Abdomen is soft.     Tenderness: There is no abdominal tenderness. There is no guarding or rebound.  Musculoskeletal: Normal range of motion.     Comments: No midline C/T/L spinal tenderness to palpation, no paraspinal muscle tenderness, no deformity, crepitus, or step-off noted. No sign of injury to the neck or back. - Hips stable to compression bilaterally without pain.  Patient able to bring  bilateral knees towards chest without pain.  Ambulatory without assistance or difficulty.  All major joints mobilized with appropriate range of motion and strength for age without pain or crepitus.  Feet:     Right foot:     Protective Sensation: 3 sites tested. 3 sites sensed.     Left foot:     Protective Sensation: 3 sites tested. 3 sites sensed.  Skin:    General: Skin is warm and dry.     Capillary Refill: Capillary refill takes less than 2 seconds.          Comments: Superficial abrasion of the right shoulder.  Patient denies tenderness of this area.  Neurological:     Mental Status: He is alert.     GCS: GCS eye subscore is 4. GCS verbal subscore is 5. GCS motor subscore is 6.     Comments: Speech is clear and goal oriented, follows commands Major Cranial nerves without deficit, no facial droop Normal strength in upper and lower extremities bilaterally including dorsiflexion and plantar flexion, strong and equal grip strength Sensation normal to light and sharp touch Moves extremities without ataxia, coordination intact Normal finger to nose and rapid alternating movements Neg romberg, no pronator drift Normal gait  Psychiatric:        Behavior: Behavior normal.    ED Treatments / Results  Labs (all labs ordered are listed, but only abnormal results are displayed) Labs Reviewed - No data to display  EKG None  Radiology Ct Head Wo Contrast  Result Date: 02/16/2019 CLINICAL DATA:  Posterior scalp hematoma, fell while loading truck, no loss of consciousness, no anticoagulation EXAM: CT HEAD WITHOUT CONTRAST CT CERVICAL SPINE WITHOUT CONTRAST TECHNIQUE: Multidetector CT imaging of the head and cervical spine was performed following the standard protocol without intravenous contrast. Multiplanar CT image reconstructions of the cervical spine were also generated. COMPARISON:  CT head 03/06/2017, MR/MRA head 03/27/2017 FINDINGS: CT HEAD FINDINGS Brain: No evidence of acute  infarction, hemorrhage, extra-axial collection  or mass lesion/mass effect. Symmetric prominence of the ventricles, cisterns and sulci compatible with frontal lobe predominant parenchymal volume loss. Confluent areas of periventricular white matter hypoattenuation are most compatible with chronic microvascular angiopathy. Vascular: Atherosclerotic calcification of the carotid siphons and intradural vertebral arteries. Calcified fusiform ectatic segment of the basilar artery is noted, similar to prior. No hyperdense vessel. Skull: There is a high left parieto-occipital scalp contusion with a subgaleal hematoma measuring up to 9 mm in maximal thickness no subjacent calvarial fracture. Sinuses/Orbits: Paranasal sinuses and mastoid air cells are predominantly clear. Right nasal septal deviation with a right-sided nasal septal spur. Orbital structures are unremarkable aside from prior lens extractions. Other: None CT CERVICAL SPINE FINDINGS Alignment: Preservation of the normal cervical lordosis without traumatic listhesis. No abnormal facet widening. Normal alignment of the craniocervical and atlantoaxial articulations. Skull base and vertebrae: No acute fracture. No primary bone lesion or focal pathologic process. Soft tissues and spinal canal: No pre or paravertebral fluid or swelling. No visible canal hematoma. Calcified pannus formation posterior to the dens. Disc levels: Multilevel intervertebral disc height loss with spondylitic endplate changes. Posterior facet hypertrophic changes and uncinate spurring are present as well. Features are maximal at C3-4 and C6-7 levels with at most mild canal stenosis and mild bilateral foraminal narrowing. Upper chest: No acute abnormality in the upper chest or imaged lung apices. Other: Normal thyroid gland. Atherosclerotic calcification in the aortic arch, cervical vertebral and carotid arteries. IMPRESSION: 1. No acute intracranial abnormality. 2. Frontal predominant  parenchymal volume loss and chronic white matter angiopathy changes, similar to priors. 3. High left parieto-occipital scalp contusion with a subgaleal hematoma measuring up to 9 mm in maximal thickness. No subjacent calvarial fracture. 4. Calcified ectatic segment of the basilar artery, similar to priors. 5. No acute cervical spine fracture or traumatic listhesis. 6. Moderate multilevel degenerative changes throughout the cervical spine, detailed above. 7. Aortic Atherosclerosis (ICD10-I70.0). Cervical and intracranial carotid and vertebral artery atherosclerosis as well. Electronically Signed   By: Lovena Le M.D.   On: 02/16/2019 17:02   Ct Cervical Spine Wo Contrast  Result Date: 02/16/2019 CLINICAL DATA:  Posterior scalp hematoma, fell while loading truck, no loss of consciousness, no anticoagulation EXAM: CT HEAD WITHOUT CONTRAST CT CERVICAL SPINE WITHOUT CONTRAST TECHNIQUE: Multidetector CT imaging of the head and cervical spine was performed following the standard protocol without intravenous contrast. Multiplanar CT image reconstructions of the cervical spine were also generated. COMPARISON:  CT head 03/06/2017, MR/MRA head 03/27/2017 FINDINGS: CT HEAD FINDINGS Brain: No evidence of acute infarction, hemorrhage, extra-axial collection or mass lesion/mass effect. Symmetric prominence of the ventricles, cisterns and sulci compatible with frontal lobe predominant parenchymal volume loss. Confluent areas of periventricular white matter hypoattenuation are most compatible with chronic microvascular angiopathy. Vascular: Atherosclerotic calcification of the carotid siphons and intradural vertebral arteries. Calcified fusiform ectatic segment of the basilar artery is noted, similar to prior. No hyperdense vessel. Skull: There is a high left parieto-occipital scalp contusion with a subgaleal hematoma measuring up to 9 mm in maximal thickness no subjacent calvarial fracture. Sinuses/Orbits: Paranasal sinuses  and mastoid air cells are predominantly clear. Right nasal septal deviation with a right-sided nasal septal spur. Orbital structures are unremarkable aside from prior lens extractions. Other: None CT CERVICAL SPINE FINDINGS Alignment: Preservation of the normal cervical lordosis without traumatic listhesis. No abnormal facet widening. Normal alignment of the craniocervical and atlantoaxial articulations. Skull base and vertebrae: No acute fracture. No primary bone lesion or focal pathologic process.  Soft tissues and spinal canal: No pre or paravertebral fluid or swelling. No visible canal hematoma. Calcified pannus formation posterior to the dens. Disc levels: Multilevel intervertebral disc height loss with spondylitic endplate changes. Posterior facet hypertrophic changes and uncinate spurring are present as well. Features are maximal at C3-4 and C6-7 levels with at most mild canal stenosis and mild bilateral foraminal narrowing. Upper chest: No acute abnormality in the upper chest or imaged lung apices. Other: Normal thyroid gland. Atherosclerotic calcification in the aortic arch, cervical vertebral and carotid arteries. IMPRESSION: 1. No acute intracranial abnormality. 2. Frontal predominant parenchymal volume loss and chronic white matter angiopathy changes, similar to priors. 3. High left parieto-occipital scalp contusion with a subgaleal hematoma measuring up to 9 mm in maximal thickness. No subjacent calvarial fracture. 4. Calcified ectatic segment of the basilar artery, similar to priors. 5. No acute cervical spine fracture or traumatic listhesis. 6. Moderate multilevel degenerative changes throughout the cervical spine, detailed above. 7. Aortic Atherosclerosis (ICD10-I70.0). Cervical and intracranial carotid and vertebral artery atherosclerosis as well. Electronically Signed   By: Lovena Le M.D.   On: 02/16/2019 17:02    Procedures Procedures (including critical care time)  Medications Ordered in  ED Medications  Tdap (BOOSTRIX) injection 0.5 mL (0.5 mLs Intramuscular Given 02/16/19 1618)     Initial Impression / Assessment and Plan / ED Course  I have reviewed the triage vital signs and the nursing notes.  Pertinent labs & imaging results that were available during my care of the patient were reviewed by me and considered in my medical decision making (see chart for details).    79 year old male arrives today after mechanical fall striking the back of his head.  No loss of consciousness, blood thinner use, denies any complaints or concerns at this time.  He has large hematoma with overlying abrasion, no active bleeding.  Additionally physical exam reveals small abrasion of the posterior right shoulder without pain of this area and with appropriate range of motion and strength.  Patient is fully alert and oriented on my exam, no neuro deficits and no neurologic complaint, well-appearing and in no acute distress.  Plan of care is to update Tdap and obtain CT head and cervical spine.  Patient seen and evaluated by Dr. Sedonia Small who agrees with care plan.  CT head/c-spine:    IMPRESSION:  1. No acute intracranial abnormality.  2. Frontal predominant parenchymal volume loss and chronic white  matter angiopathy changes, similar to priors.  3. High left parieto-occipital scalp contusion with a subgaleal  hematoma measuring up to 9 mm in maximal thickness. No subjacent  calvarial fracture.  4. Calcified ectatic segment of the basilar artery, similar to  priors.  5. No acute cervical spine fracture or traumatic listhesis.  6. Moderate multilevel degenerative changes throughout the cervical  spine, detailed above.  7. Aortic Atherosclerosis (ICD10-I70.0). Cervical and intracranial  carotid and vertebral artery atherosclerosis as well.  - Patient reevaluated resting comfortably no acute distress, he reports he feels well and like a "spring chicken".  I have updated patient and his wife on CT  findings today and they state understanding.  Plan of care is for symptomatic treatment with topical antibiotic ointment, cool compresses and follow-up with PCP.  Patient and wife aware to discuss incidental findings today on CT scan with their PCP.  On follow-up exam well-appearing, no acute distress, no neuro deficits, he has no complaint  At this time there does not appear to be any evidence  of an acute emergency medical condition and the patient appears stable for discharge with appropriate outpatient follow up. Diagnosis was discussed with patient who verbalizes understanding of care plan and is agreeable to discharge. I have discussed return precautions with patient and wife who verbalizes understanding of return precautions. Patient encouraged to follow-up with their PCP. All questions answered. Patient has been discharged in good condition.  Patient's case discussed with Dr. Sedonia Small who agrees with plan to discharge with follow-up.   Note: Portions of this report may have been transcribed using voice recognition software. Every effort was made to ensure accuracy; however, inadvertent computerized transcription errors may still be present. Final Clinical Impressions(s) / ED Diagnoses   Final diagnoses:  Fall, initial encounter  Injury of head, initial encounter  Contusion of scalp, initial encounter  Abrasion of right shoulder, initial encounter    ED Discharge Orders    None       Gari Crown 02/16/19 1739    Maudie Flakes, MD 02/16/19 2308

## 2019-03-03 ENCOUNTER — Telehealth: Payer: Self-pay

## 2019-03-03 NOTE — Telephone Encounter (Signed)
Called patient and spoke to his wife and let her know that there are no standing orders for labs since they were just done in August and Dr. Elease Hashimoto has filled his Metformin and Atorvastatin for a year and not due until May 2021. Wife verbalized an understanding.  Copied from [place] [phone]. Topic: General - Other >> Mar 03, 2019 11:39 AM Celene Kras wrote: Reason for CRM: Pts wife called stating she received a call on behalf of pt stating he needed to get diabetes and cholesterol checked. Please advise.

## 2019-04-14 ENCOUNTER — Other Ambulatory Visit: Payer: Self-pay

## 2019-04-15 ENCOUNTER — Encounter: Payer: Self-pay | Admitting: Family Medicine

## 2019-04-15 ENCOUNTER — Other Ambulatory Visit: Payer: Self-pay

## 2019-04-15 ENCOUNTER — Ambulatory Visit (INDEPENDENT_AMBULATORY_CARE_PROVIDER_SITE_OTHER): Payer: PPO | Admitting: Family Medicine

## 2019-04-15 VITALS — BP 128/76 | HR 68 | Temp 97.3°F | Ht 67.0 in | Wt 152.0 lb

## 2019-04-15 DIAGNOSIS — R634 Abnormal weight loss: Secondary | ICD-10-CM

## 2019-04-15 DIAGNOSIS — R413 Other amnesia: Secondary | ICD-10-CM

## 2019-04-15 DIAGNOSIS — F321 Major depressive disorder, single episode, moderate: Secondary | ICD-10-CM | POA: Insufficient documentation

## 2019-04-15 LAB — COMPREHENSIVE METABOLIC PANEL
ALT: 18 U/L (ref 0–53)
AST: 16 U/L (ref 0–37)
Albumin: 4.3 g/dL (ref 3.5–5.2)
Alkaline Phosphatase: 57 U/L (ref 39–117)
BUN: 24 mg/dL — ABNORMAL HIGH (ref 6–23)
CO2: 32 mEq/L (ref 19–32)
Calcium: 10.1 mg/dL (ref 8.4–10.5)
Chloride: 100 mEq/L (ref 96–112)
Creatinine, Ser: 1.08 mg/dL (ref 0.40–1.50)
GFR: 66.26 mL/min (ref >60.00)
Glucose, Bld: 118 mg/dL — ABNORMAL HIGH (ref 70–99)
Potassium: 4.6 mEq/L (ref 3.5–5.1)
Sodium: 139 mEq/L (ref 135–145)
Total Bilirubin: 0.8 mg/dL (ref 0.2–1.2)
Total Protein: 6.9 g/dL (ref 6.0–8.3)

## 2019-04-15 LAB — VITAMIN B12: Vitamin B-12: 603 pg/mL (ref 211–911)

## 2019-04-15 LAB — CBC WITH DIFFERENTIAL/PLATELET
Basophils Absolute: 0.1 10*3/uL (ref 0.0–0.1)
Basophils Relative: 1 % (ref 0.0–3.0)
Eosinophils Absolute: 0.5 10*3/uL (ref 0.0–0.7)
Eosinophils Relative: 5.8 % — ABNORMAL HIGH (ref 0.0–5.0)
HCT: 43.1 % (ref 39.0–52.0)
Hemoglobin: 14.2 g/dL (ref 13.0–17.0)
Lymphocytes Relative: 14.5 % (ref 12.0–46.0)
Lymphs Abs: 1.3 10*3/uL (ref 0.7–4.0)
MCHC: 33 g/dL (ref 30.0–36.0)
MCV: 97.6 fl (ref 78.0–100.0)
Monocytes Absolute: 0.5 10*3/uL (ref 0.1–1.0)
Monocytes Relative: 6 % (ref 3.0–12.0)
Neutro Abs: 6.5 10*3/uL (ref 1.4–7.7)
Neutrophils Relative %: 72.7 % (ref 43.0–77.0)
Platelets: 162 10*3/uL (ref 150.0–400.0)
RBC: 4.41 Mil/uL (ref 4.22–5.81)
RDW: 14 % (ref 11.5–15.5)
WBC: 8.9 10*3/uL (ref 4.0–10.5)

## 2019-04-15 MED ORDER — ESCITALOPRAM OXALATE 10 MG PO TABS: 10.0000 mg | ORAL_TABLET | Freq: Every day | ORAL | 5 refills | Status: DC

## 2019-04-15 NOTE — Progress Notes (Signed)
Subjective:     Patient ID: Miguel Vazquez, male   DOB: April 09, 1940, 79 y.o.   MRN: [id number]  HPI Miguel Vazquez is here accompanied by wife to discuss concerns for recent cognitive changes.  She states that he "sleeps all the time" and also seems to be having more forgetfulness and memory disturbance.  Frequently misplacing things.  Sometimes repeats himself.  They are dealing with the stress that their son who had Down syndrome died of complications pneumonia about a month ago.  He had been cared for in their home since birth.    Miguel Vazquez also had a fall back in November and had CT head which showed no acute bleed.  Wife thinks his symptoms preceded that.  He has had some modest weight loss of about 10 pounds from last year.  Poor appetite.  His chronic problems include hypertension, CAD, type 2 diabetes, hypothyroidism, hyperlipidemia.  Denies any recent chest pains.  He is on thyroid replacement and recent thyroid functions were normal.  He takes B12 supplement orally.  Past Medical History:  Diagnosis Date  . Arthritis   . Brain aneurysm 2018   followed by Dr [name]  . Cataract   . Chronic kidney disease    kidney stone- 40 years ago  . Coronary artery disease   . Diabetes mellitus   . GERD (gastroesophageal reflux disease)   . Hx of abdominal aortic aneurysm 1998  . Hyperlipidemia   . Hypertension   . Myocardial infarction ([place])    2016  . Personal history of colonic polyps - adenomas 09/09/2013   Past Surgical History:  Procedure Laterality Date  . ABDOMINAL AORTIC ANEURYSM REPAIR  1998  . CARDIAC CATHETERIZATION N/A 03/18/2015   Procedure: Left Heart Cath and Coronary Angiography;  Surgeon: Peter M Martinique, MD;  Location: [place] [facility];  Service: Cardiovascular;  Laterality: N/A;  . cataract surgery Right   . COLONOSCOPY    . CORONARY ARTERY BYPASS GRAFT N/A 03/18/2015   Procedure: CORONARY ARTERY BYPASS GRAFTING (CABG) x  three, using left internal mammary artery and right  leg greater saphenous vein harvested endoscopically;  Surgeon: Melrose Nakayama, MD;  Location: [place];  Service: Open Heart Surgery;  Laterality: N/A;  . INGUINAL HERNIA REPAIR Bilateral   . INTRAOPERATIVE TRANSESOPHAGEAL ECHOCARDIOGRAM N/A 03/18/2015   Procedure: INTRAOPERATIVE TRANSESOPHAGEAL ECHOCARDIOGRAM;  Surgeon: Melrose Nakayama, MD;  Location: [place];  Service: Open Heart Surgery;  Laterality: N/A;  . open heart surgery  2016  . [place]    reports that he quit smoking about 32 years ago. His smoking use included cigarettes. He has a 30.00 pack-year smoking history. He has never used smokeless tobacco. He reports current alcohol use. He reports that he does not use drugs. family history includes Bone cancer in his mother; Diabetes in his paternal aunt; Down syndrome in his son; Stroke in his son. Allergies  Allergen Reactions  . Food Anaphylaxis    TREE NUTS  . Shellfish Allergy Anaphylaxis  . Prednisone     "psychosis"  . Venomil Honey Bee Venom [Honey Bee Venom] Swelling    Localized swelling     Review of Systems  Constitutional: Positive for appetite change and unexpected weight change. Negative for chills and fever.  HENT: Negative for trouble swallowing.   Respiratory: Negative for cough and shortness of breath.   Cardiovascular: Negative for chest pain.  Gastrointestinal: Negative for abdominal pain.  Genitourinary: Negative for dysuria.  Neurological:  Negative for headaches.  Hematological: Negative for adenopathy. Does not bruise/bleed easily.  Psychiatric/Behavioral: Positive for dysphoric mood. Negative for agitation and suicidal ideas.       Objective:   Physical Exam Vitals reviewed.  Constitutional:      General: He is not in acute distress.    Appearance: Normal appearance. He is not toxic-appearing.  Cardiovascular:     Rate and Rhythm: Normal rate and regular rhythm.  Pulmonary:     Effort: Pulmonary effort is normal.      Breath sounds: Normal breath sounds.  Musculoskeletal:     Cervical back: Neck supple.     Right lower leg: No edema.     Left lower leg: No edema.  Neurological:     General: No focal deficit present.     Mental Status: He is alert.     Cranial Nerves: No cranial nerve deficit.     Motor: No weakness.     Gait: Gait normal.  Psychiatric:     Comments: MMSE 19/30  PHQ-9=16          Assessment:     #1 progressive memory loss and cognitive impairment.  He has greatly diminished MMSE score of 19.  No recent score for comparison.  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  Wife thinks he has had some depressive symptoms even prior to loss of their son so this does not appear to be only recent grieving  #3 weight loss possibly secondary to #2    Plan:     -Check B12 level.  We did not recheck TSH as this was recently done -Check CBC and comprehensive metabolic panel in view of his recent weight loss -Start Lexapro 10 mg daily -Reassess 1 month.  Consider possible trial of Aricept at that time depending on results from the above.  Eulas Post MD [facility]

## 2019-04-15 NOTE — Patient Instructions (Signed)
Start the Lexapro one daily  Set up one month follow up.

## 2019-04-28 ENCOUNTER — Other Ambulatory Visit: Payer: Self-pay | Admitting: Family Medicine

## 2019-05-07 ENCOUNTER — Other Ambulatory Visit: Payer: Self-pay | Admitting: Family Medicine

## 2019-05-07 NOTE — Telephone Encounter (Signed)
Office visit 04/15/19:   Return in about 1 month (around 05/16/2019). Start the Lexapro one daily     Office visit scheduled for 05/18/19.  Okay to refill?

## 2019-05-08 NOTE — Telephone Encounter (Signed)
Refill OK

## 2019-05-15 ENCOUNTER — Other Ambulatory Visit: Payer: Self-pay

## 2019-05-18 ENCOUNTER — Other Ambulatory Visit: Payer: Self-pay

## 2019-05-18 ENCOUNTER — Telehealth (INDEPENDENT_AMBULATORY_CARE_PROVIDER_SITE_OTHER): Payer: PPO | Admitting: Family Medicine

## 2019-05-18 DIAGNOSIS — M79604 Pain in right leg: Secondary | ICD-10-CM | POA: Diagnosis not present

## 2019-05-18 DIAGNOSIS — F329 Major depressive disorder, single episode, unspecified: Secondary | ICD-10-CM

## 2019-05-18 DIAGNOSIS — R4589 Other symptoms and signs involving emotional state: Secondary | ICD-10-CM

## 2019-05-18 DIAGNOSIS — R4189 Other symptoms and signs involving cognitive functions and awareness: Secondary | ICD-10-CM | POA: Diagnosis not present

## 2019-05-18 MED ORDER — DONEPEZIL HCL 5 MG PO TABS: 5.0000 mg | ORAL_TABLET | Freq: Every day | ORAL | 0 refills | Status: DC

## 2019-05-18 NOTE — Progress Notes (Signed)
This visit type was conducted due to national recommendations for restrictions regarding the COVID-19 pandemic in an effort to limit this patient's exposure and mitigate transmission in our community.   Virtual Visit via Video Note  I connected with Miguel Vazquez on 05/18/19 at 11:15 AM EST by a video enabled telemedicine application and verified that I am speaking with the correct person using two identifiers.  Location patient: home Location provider:work or home office Persons participating in the virtual visit: patient, provider  I discussed the limitations of evaluation and management by telemedicine and the availability of in person appointments. The patient expressed understanding and agreed to proceed.   HPI: Miguel Vazquez was seen recently with some depression issues and cognitive impairment.  He scored 19 out of 30 on MMSE.  We had some definite concerns with depression as he had recently lost his son who has Down syndrome.  He started Lexapro 10 mg daily but wife has not seen much improvement at all.  They would like to discontinue Lexapro at this point.  We discussed possible trial of Aricept   ROS: See pertinent positives and negatives per HPI.  B12 and thyroid functions were normal.  Patient apparently had a fall last week.  He was out around his truck and thinks he tripped over a stump.  He landed and apparently hit the ground with his head but there was no loss of consciousness.  Not any headache since then.  His pain is behind the right knee region.  His pain is worse when he first moves but is actually improved after he walks always.  He states that he had no pain for at least the day after the injury with walking.  Is not noted any bruising.  No obvious edema.  No locking or giving way.  No hip pain.  No localized tenderness  Past Medical History:  Diagnosis Date  . Arthritis   . Brain aneurysm 2018   followed by Dr [name]  . Cataract   . Chronic kidney disease    kidney stone- 40  years ago  . Coronary artery disease   . Diabetes mellitus   . GERD (gastroesophageal reflux disease)   . Hx of abdominal aortic aneurysm 1998  . Hyperlipidemia   . Hypertension   . Myocardial infarction ([place])    2016  . Personal history of colonic polyps - adenomas 09/09/2013   Past Surgical History:  Procedure Laterality Date  . ABDOMINAL AORTIC ANEURYSM REPAIR  1998  . CARDIAC CATHETERIZATION N/A 03/18/2015   Procedure: Left Heart Cath and Coronary Angiography;  Surgeon: Peter M Martinique, MD;  Location: [place] [facility];  Service: Cardiovascular;  Laterality: N/A;  . cataract surgery Right   . COLONOSCOPY    . CORONARY ARTERY BYPASS GRAFT N/A 03/18/2015   Procedure: CORONARY ARTERY BYPASS GRAFTING (CABG) x  three, using left internal mammary artery and right leg greater saphenous vein harvested endoscopically;  Surgeon: Melrose Nakayama, MD;  Location: [place];  Service: Open Heart Surgery;  Laterality: N/A;  . INGUINAL HERNIA REPAIR Bilateral   . INTRAOPERATIVE TRANSESOPHAGEAL ECHOCARDIOGRAM N/A 03/18/2015   Procedure: INTRAOPERATIVE TRANSESOPHAGEAL ECHOCARDIOGRAM;  Surgeon: Melrose Nakayama, MD;  Location: [place];  Service: Open Heart Surgery;  Laterality: N/A;  . open heart surgery  2016  . [place]    reports that he quit smoking about 32 years ago. His smoking use included cigarettes. He has a 30.00 pack-year smoking history. He has never used  smokeless tobacco. He reports current alcohol use. He reports that he does not use drugs. family history includes Bone cancer in his mother; Diabetes in his paternal aunt; Down syndrome in his son; Stroke in his son. Allergies  Allergen Reactions  . Food Anaphylaxis    TREE NUTS  . Shellfish Allergy Anaphylaxis  . Prednisone     "psychosis"  . Venomil Honey Bee Venom [Honey Bee Venom] Swelling    Localized swelling     Past Medical History:  Diagnosis Date  . Arthritis   . Brain aneurysm 2018    followed by Dr [name]  . Cataract   . Chronic kidney disease    kidney stone- 40 years ago  . Coronary artery disease   . Diabetes mellitus   . GERD (gastroesophageal reflux disease)   . Hx of abdominal aortic aneurysm 1998  . Hyperlipidemia   . Hypertension   . Myocardial infarction ([place])    2016  . Personal history of colonic polyps - adenomas 09/09/2013    Past Surgical History:  Procedure Laterality Date  . ABDOMINAL AORTIC ANEURYSM REPAIR  1998  . CARDIAC CATHETERIZATION N/A 03/18/2015   Procedure: Left Heart Cath and Coronary Angiography;  Surgeon: Peter M Martinique, MD;  Location: [place] [facility];  Service: Cardiovascular;  Laterality: N/A;  . cataract surgery Right   . COLONOSCOPY    . CORONARY ARTERY BYPASS GRAFT N/A 03/18/2015   Procedure: CORONARY ARTERY BYPASS GRAFTING (CABG) x  three, using left internal mammary artery and right leg greater saphenous vein harvested endoscopically;  Surgeon: Melrose Nakayama, MD;  Location: [place];  Service: Open Heart Surgery;  Laterality: N/A;  . INGUINAL HERNIA REPAIR Bilateral   . INTRAOPERATIVE TRANSESOPHAGEAL ECHOCARDIOGRAM N/A 03/18/2015   Procedure: INTRAOPERATIVE TRANSESOPHAGEAL ECHOCARDIOGRAM;  Surgeon: Melrose Nakayama, MD;  Location: [place];  Service: Open Heart Surgery;  Laterality: N/A;  . open heart surgery  2016  . UMBILICAL HERNIA REPAIR  1965    Family History  Problem Relation Age of Onset  . Bone cancer Mother   . Diabetes Paternal Aunt   . Stroke Son   . Down syndrome Son   . Colon cancer Neg Hx   . Esophageal cancer Neg Hx   . Rectal cancer Neg Hx   . Stomach cancer Neg Hx     SOCIAL HX: Lives with wife.  Non-smoker.  No alcohol  Current Outpatient Medications:  .  aspirin EC 81 MG tablet, Take 1 tablet (81 mg total) by mouth daily., Disp: 90 tablet, Rfl: 3 .  atorvastatin (LIPITOR) 80 MG tablet, TAKE 1 TABLET (80 MG TOTAL) BY MOUTH DAILY AT 6 PM., Disp: 90 tablet, Rfl: 2 .  carvedilol (COREG) 6.25 MG  tablet, TAKE 1 TABLET BY MOUTH TWICE A DAY, Disp: 180 tablet, Rfl: 3 .  EPINEPHrine 0.3 mg/0.3 mL IJ SOAJ injection, Inject 0.3 mLs (0.3 mg total) into the muscle as needed for anaphylaxis., Disp: 2 each, Rfl: 3 .  glucose blood (FREESTYLE LITE) test strip, Check Blood Sugars 1-2 times per day. DX: E11.9., Disp: 100 each, Rfl: 5 .  Lancets (FREESTYLE) lancets, Check Blood Sugars 1-2 times per day. DX: E11.9, Disp: 100 each, Rfl: 5 .  levothyroxine (SYNTHROID) 75 MCG tablet, TAKE 1 TABLET BY MOUTH EVERY DAY, Disp: 90 tablet, Rfl: 1 .  losartan (COZAAR) 100 MG tablet, TAKE 1 TABLET BY MOUTH EVERY DAY, Disp: 90 tablet, Rfl: 1 .  metFORMIN (GLUCOPHAGE) 1000 MG tablet, TAKE 1 TABLET  BY MOUTH TWICE A DAY, Disp: 180 tablet, Rfl: 3 .  Multiple Vitamin (MULTIVITAMIN) tablet, Take 1 tablet by mouth daily. Centrium Silver once daily , Disp: , Rfl:  .  vitamin B-12 (CYANOCOBALAMIN) 1000 MCG tablet, Take 1,000 mcg by mouth daily., Disp: , Rfl:  .  vitamin C (ASCORBIC ACID) 500 MG tablet, Take 500 mg by mouth daily., Disp: , Rfl:   EXAM:  VITALS per patient if applicable:  GENERAL: alert, oriented, appears well and in no acute distress  HEENT: atraumatic, conjunttiva clear, no obvious abnormalities on inspection of external nose and ears  NECK: normal movements of the head and neck  LUNGS: on inspection no signs of respiratory distress, breathing rate appears normal, no obvious gross SOB, gasping or wheezing  CV: no obvious cyanosis  MS: moves all visible extremities without noticeable abnormality  PSYCH/NEURO: pleasant and cooperative, no obvious depression or anxiety, speech and thought processing grossly intact  ASSESSMENT AND PLAN:  Discussed the following assessment and plan:  #1 cognitive impairment.  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  Suspect largely related to grieving.  They did not see any improvement with Lexapro -We decided to hold Lexapro at this time  #3 recent fall with right leg and posterior knee pain.  He is not having any significant pain with ambulation which would seem to make likelihood of fracture less likely  -Observe for now but get in here for evaluation and possible x-ray if not further improved over the next few days     I discussed the assessment and treatment plan with the patient. The patient was provided an opportunity to ask questions and all were answered. The patient agreed with the plan and demonstrated an understanding of the instructions.   The patient was advised to call back or seek an in-person evaluation if the symptoms worsen or if the condition fails to improve as anticipated.     Carolann Littler, MD

## 2019-05-19 ENCOUNTER — Other Ambulatory Visit: Payer: PPO

## 2019-05-19 ENCOUNTER — Encounter: Payer: Self-pay | Admitting: Family Medicine

## 2019-05-19 ENCOUNTER — Ambulatory Visit (INDEPENDENT_AMBULATORY_CARE_PROVIDER_SITE_OTHER): Payer: PPO

## 2019-05-19 ENCOUNTER — Other Ambulatory Visit: Payer: Self-pay

## 2019-05-19 ENCOUNTER — Ambulatory Visit (INDEPENDENT_AMBULATORY_CARE_PROVIDER_SITE_OTHER): Payer: PPO | Admitting: Family Medicine

## 2019-05-19 VITALS — BP 132/80 | HR 85 | Temp 96.4°F

## 2019-05-19 DIAGNOSIS — M25561 Pain in right knee: Secondary | ICD-10-CM | POA: Diagnosis not present

## 2019-05-19 DIAGNOSIS — S8991XA Unspecified injury of right lower leg, initial encounter: Secondary | ICD-10-CM | POA: Diagnosis not present

## 2019-05-19 DIAGNOSIS — R296 Repeated falls: Secondary | ICD-10-CM

## 2019-05-19 DIAGNOSIS — M79651 Pain in right thigh: Secondary | ICD-10-CM

## 2019-05-19 DIAGNOSIS — S79921A Unspecified injury of right thigh, initial encounter: Secondary | ICD-10-CM | POA: Diagnosis not present

## 2019-05-19 DIAGNOSIS — M79604 Pain in right leg: Secondary | ICD-10-CM | POA: Diagnosis not present

## 2019-05-19 DIAGNOSIS — S72114A Nondisplaced fracture of greater trochanter of right femur, initial encounter for closed fracture: Secondary | ICD-10-CM

## 2019-05-19 IMAGING — DX DG KNEE COMPLETE 4+V*R*
4 series · 4 of 4 positions shown · non-contrast
Comparison: None.

CLINICAL DATA: Recent fall.  Right lower extremity pain.

EXAM:
RIGHT KNEE - COMPLETE 4+ VIEW

[knee ap (1 of 3)]
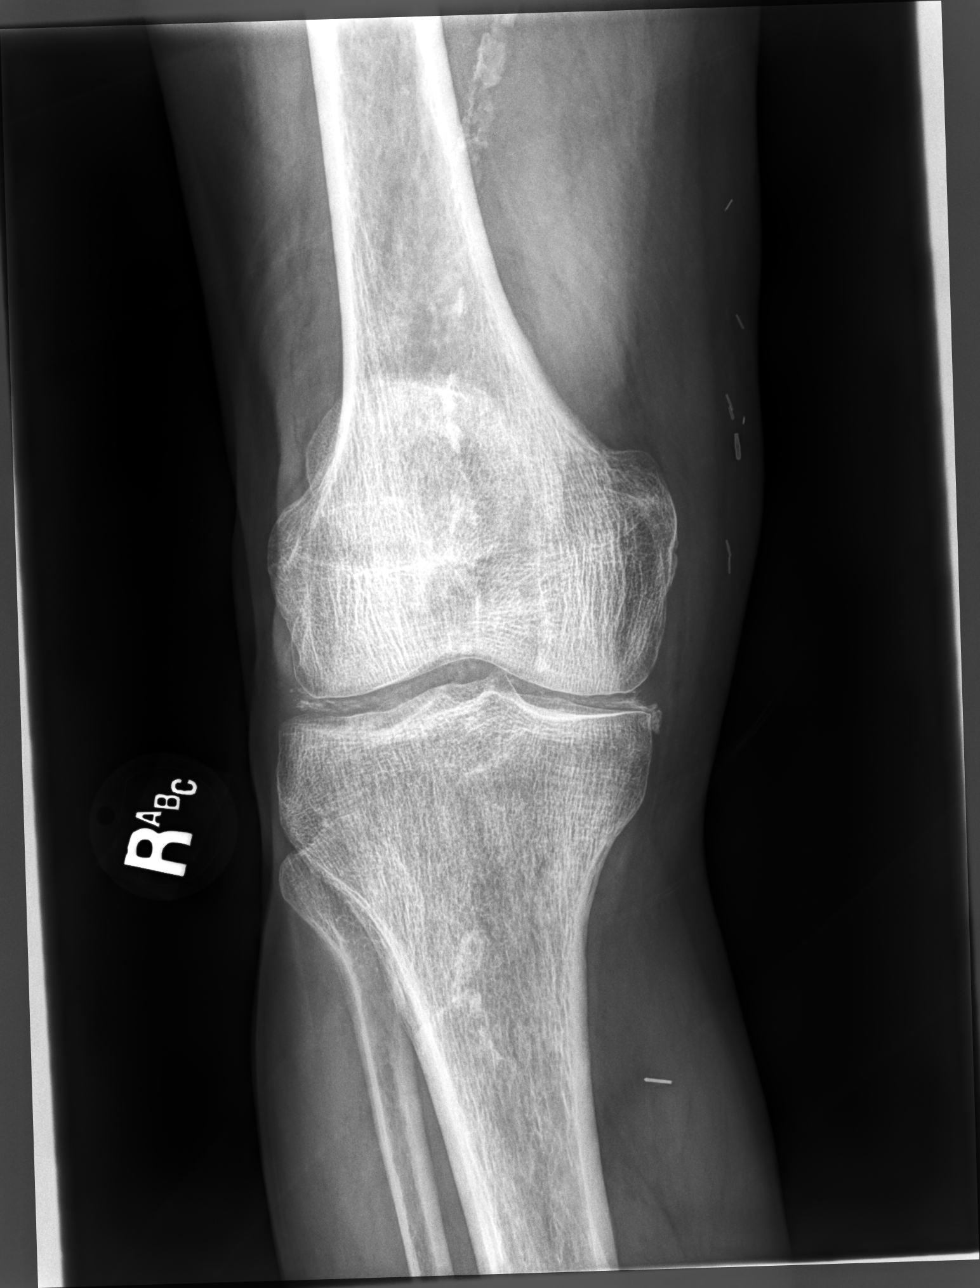

[knee standing lat]
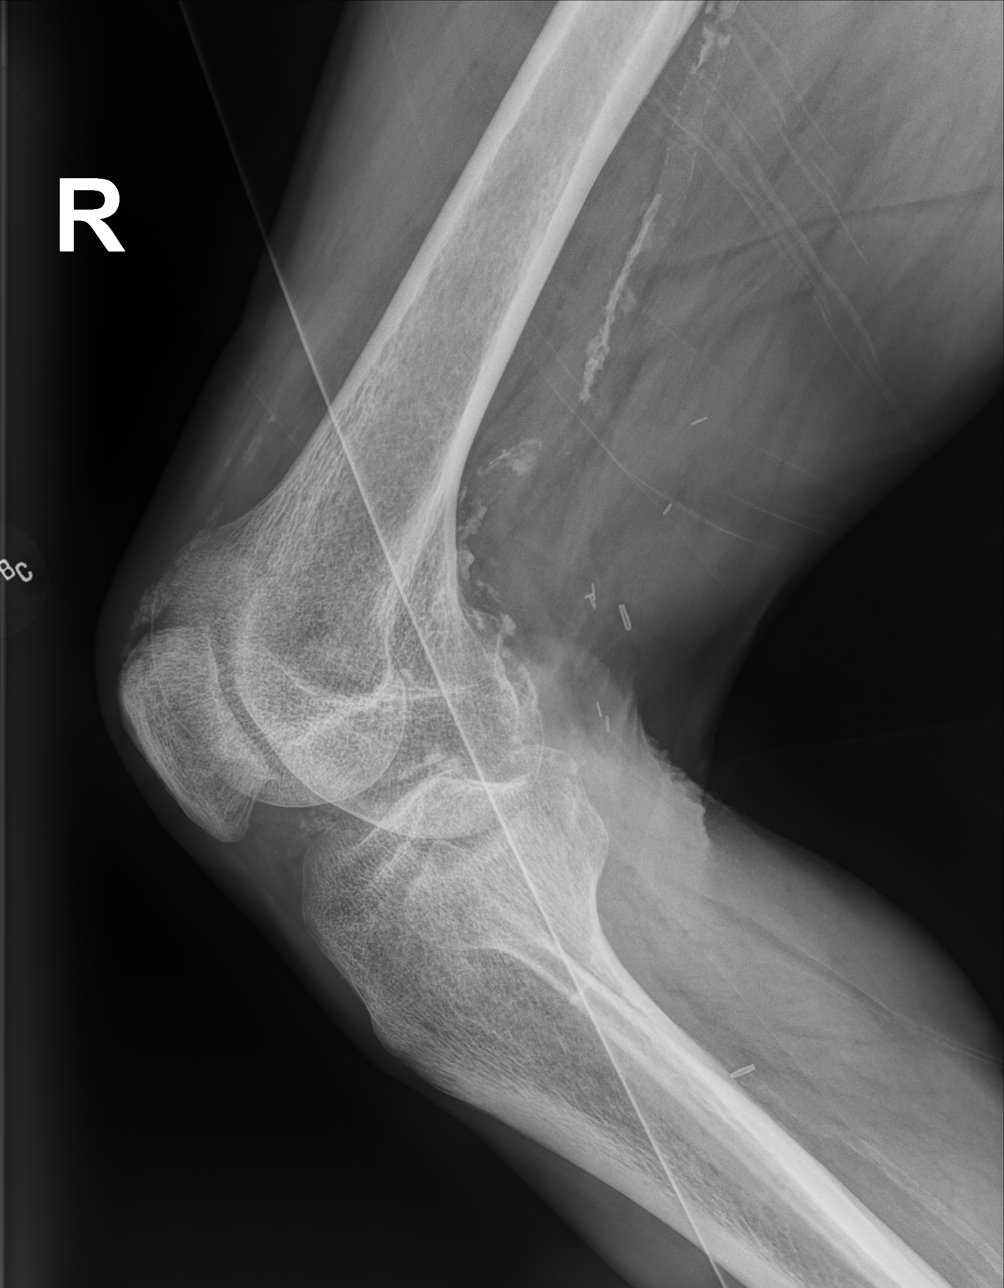

[knee ap (2 of 3)]
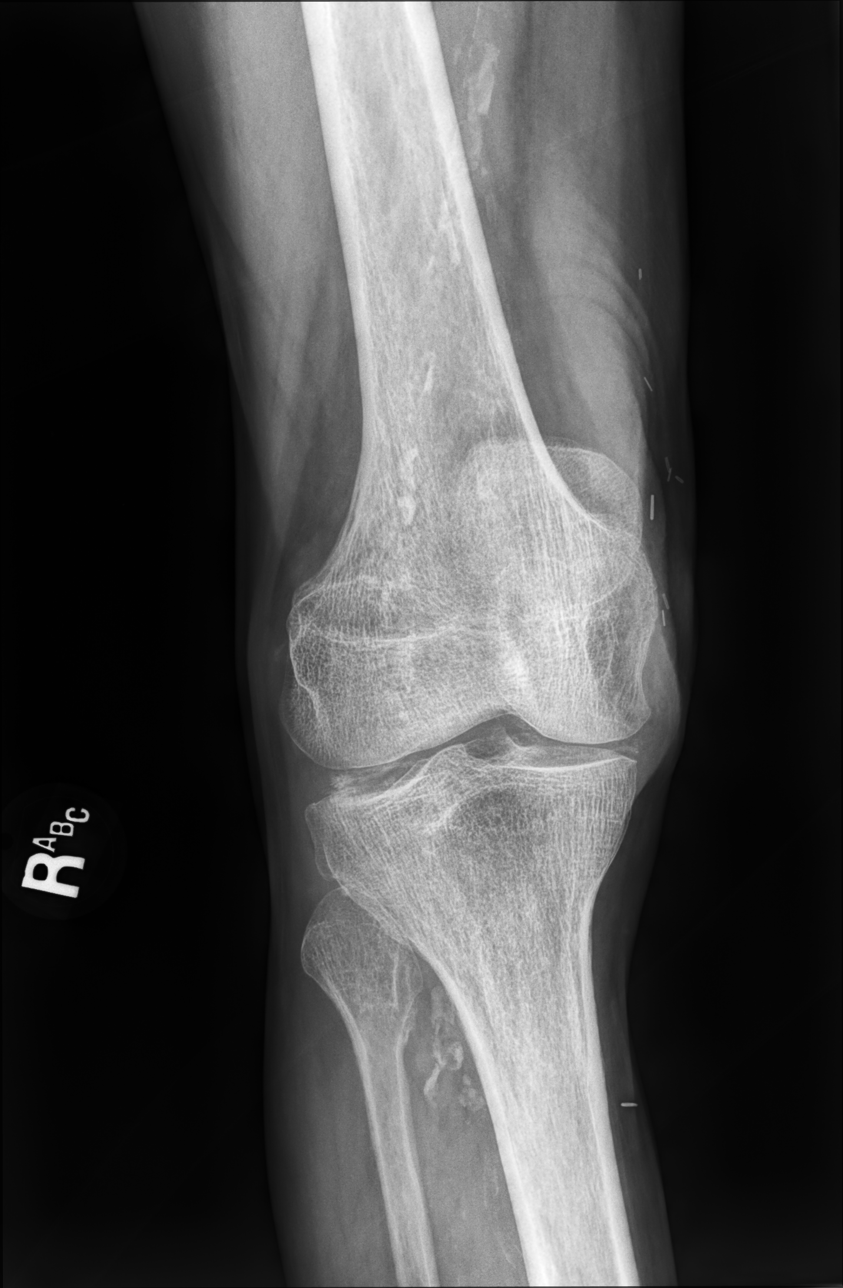

[knee ap (3 of 3)]
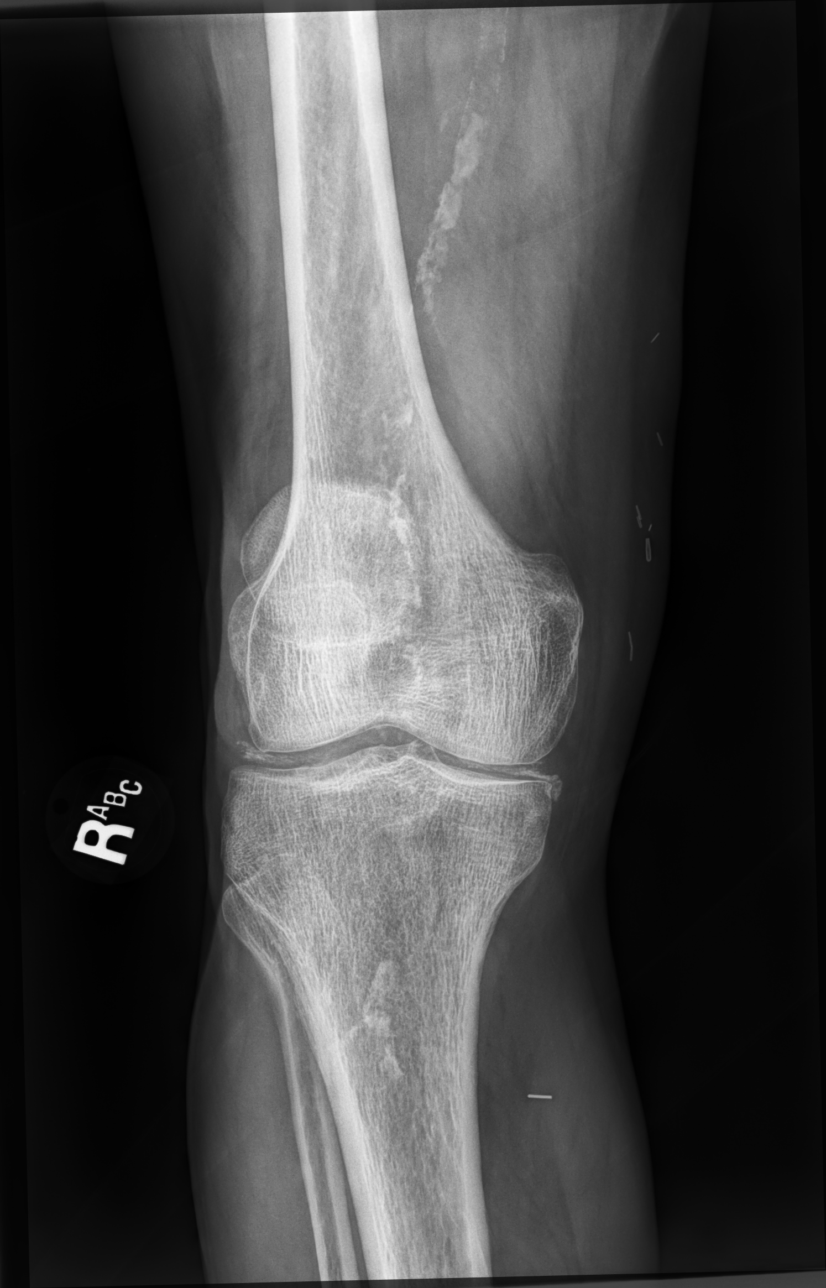

[4 of 4 positions shown; findings below may reference images not displayed]

FINDINGS: Moderate degenerative changes throughout the right knee with joint
space narrowing and spurring. Subchondral sclerosis. No acute bony
abnormality. Specifically, no fracture, subluxation, or dislocation.
Vascular calcifications noted.
IMPRESSION: Moderate degenerative changes and chondrocalcinosis. No acute bony
abnormality.

## 2019-05-19 IMAGING — DX DG FEMUR 2+V*R*
4 series · 4 of 4 positions shown · non-contrast
Comparison: Right hip series today

CLINICAL DATA: Recent falls.  Right leg pain

EXAM:
RIGHT FEMUR 2 VIEWS

[femur ap]
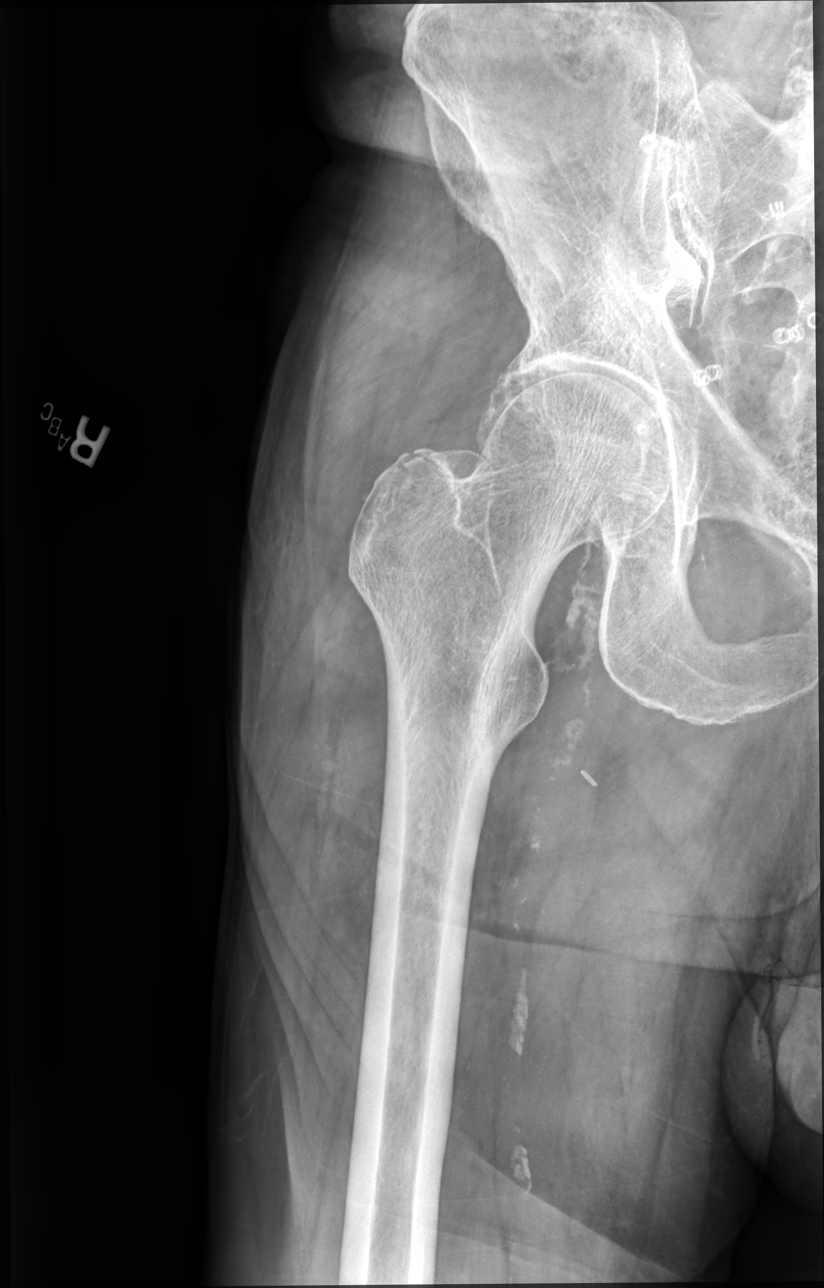

[femur lat (1 of 2)]
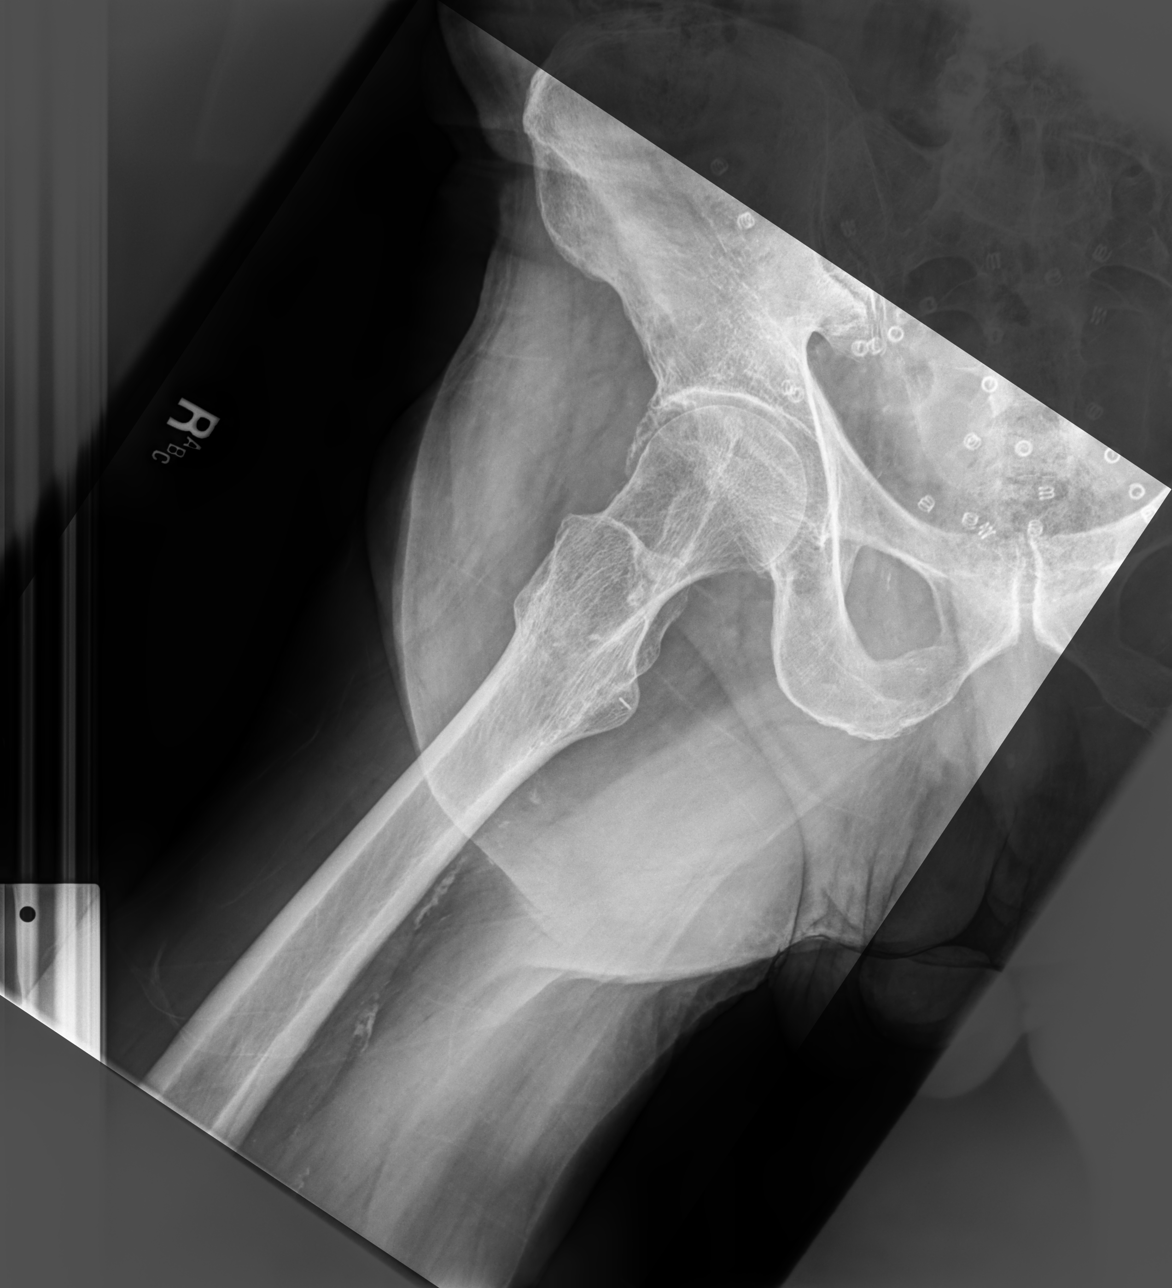

[hip joint ap]
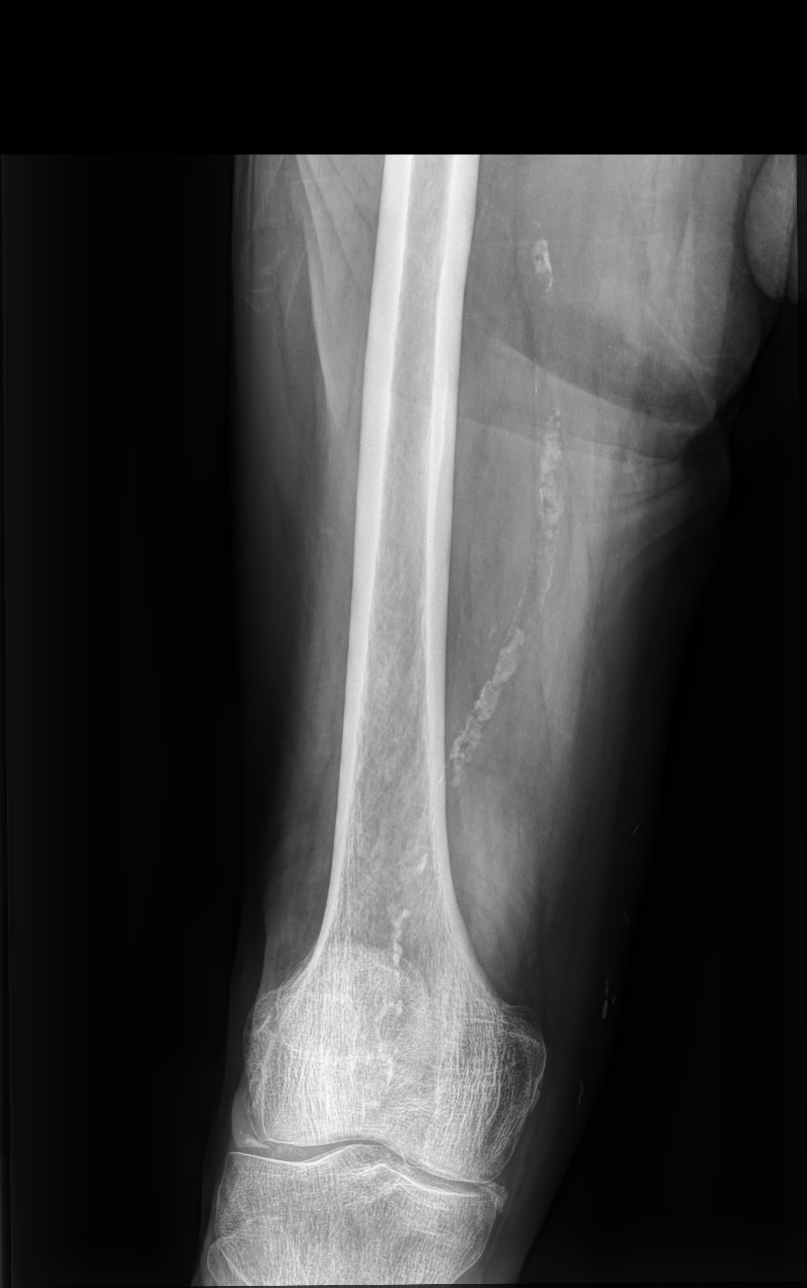

[femur lat (2 of 2)]
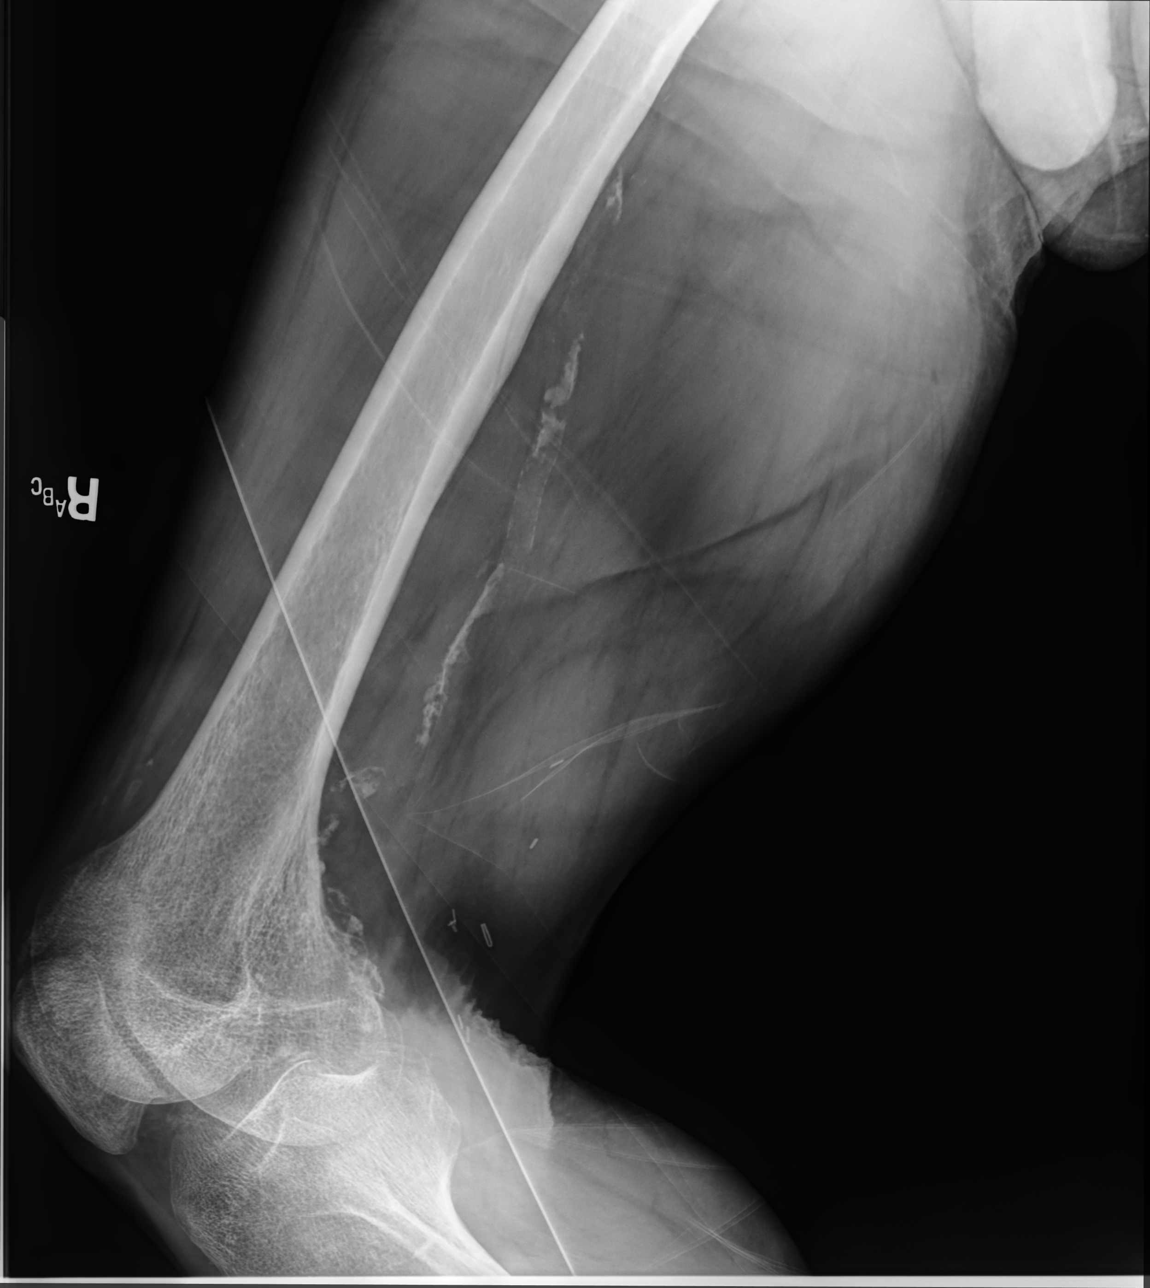

[4 of 4 positions shown; findings below may reference images not displayed]

FINDINGS: Cortical lucency noted in the greater trochanter on the right
concerning for greater trochanteric fracture. No extension across
the hip joint visualized. No subluxation or dislocation. Vascular
calcifications throughout the right leg.
IMPRESSION: Concern for fracture in the right femoral greater trochanteric
region. This could be further evaluated with CT if felt clinically
indicated.

## 2019-05-19 IMAGING — DX DG HIP (WITH OR WITHOUT PELVIS) 2-3V*R*
2 series · 2 of 2 positions shown · non-contrast
Comparison: None.

CLINICAL DATA: Right thigh pain

EXAM:
DG HIP (WITH OR WITHOUT PELVIS) 2-3V RIGHT

[hip joint ap]
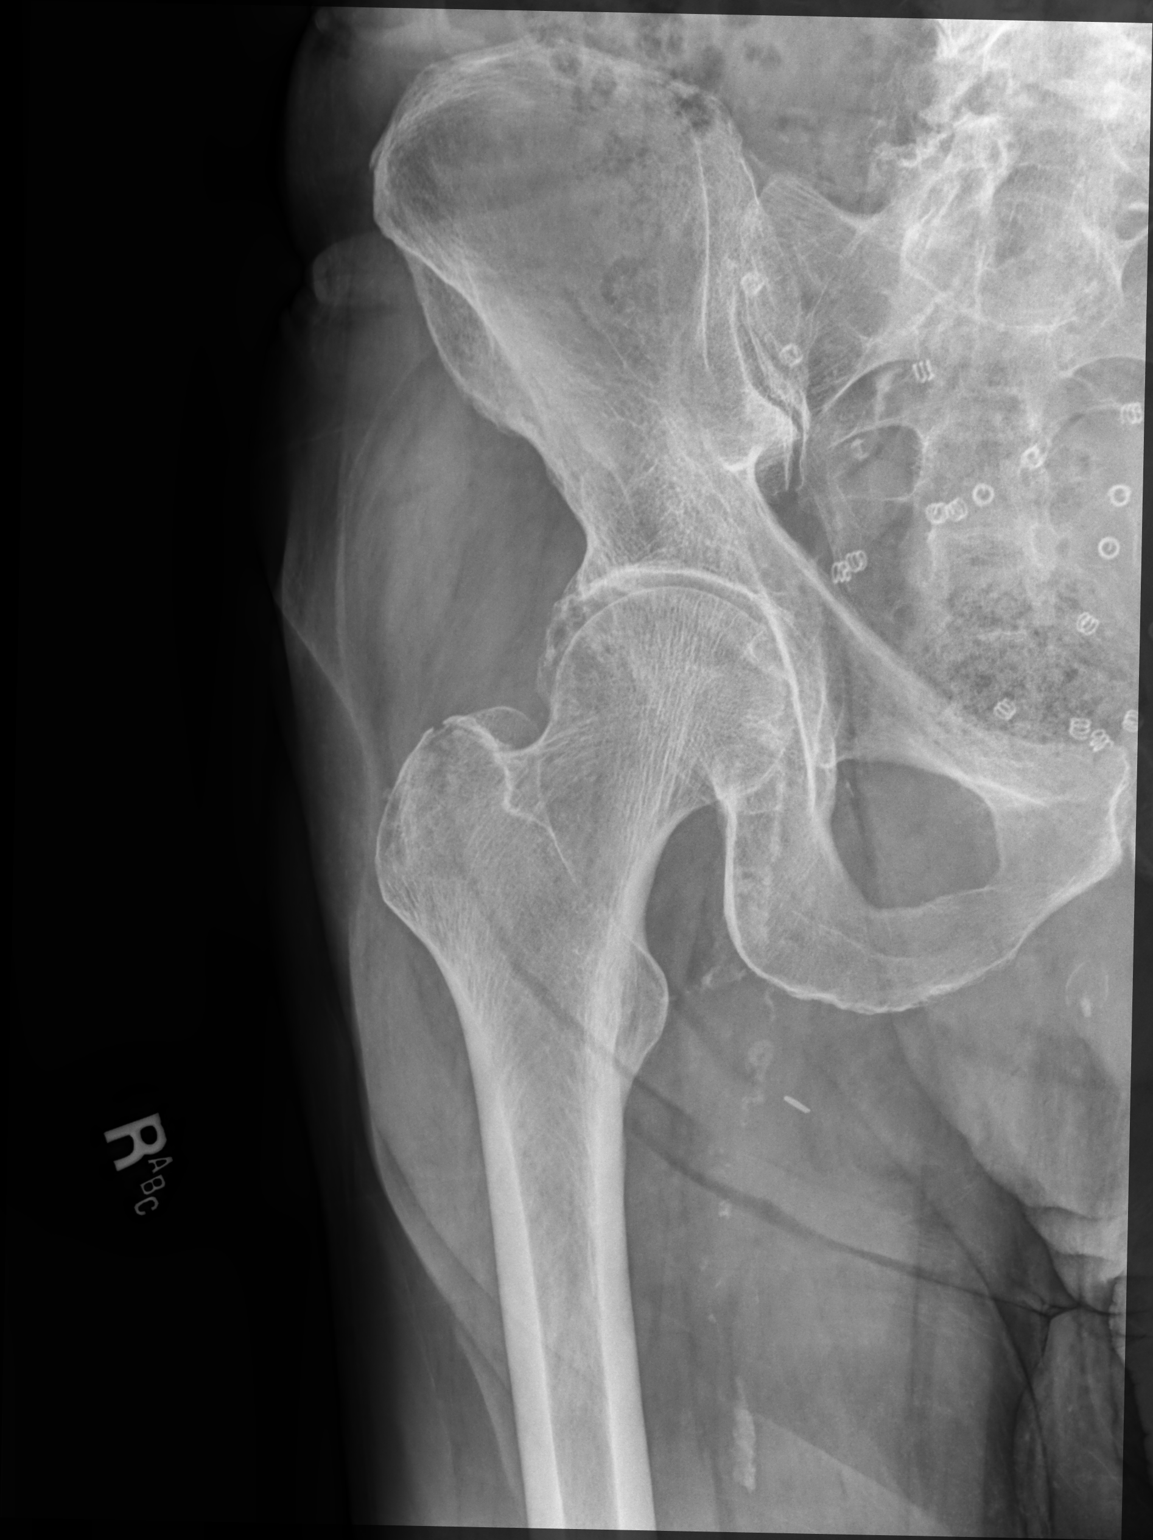

[hip joint (frog view)]
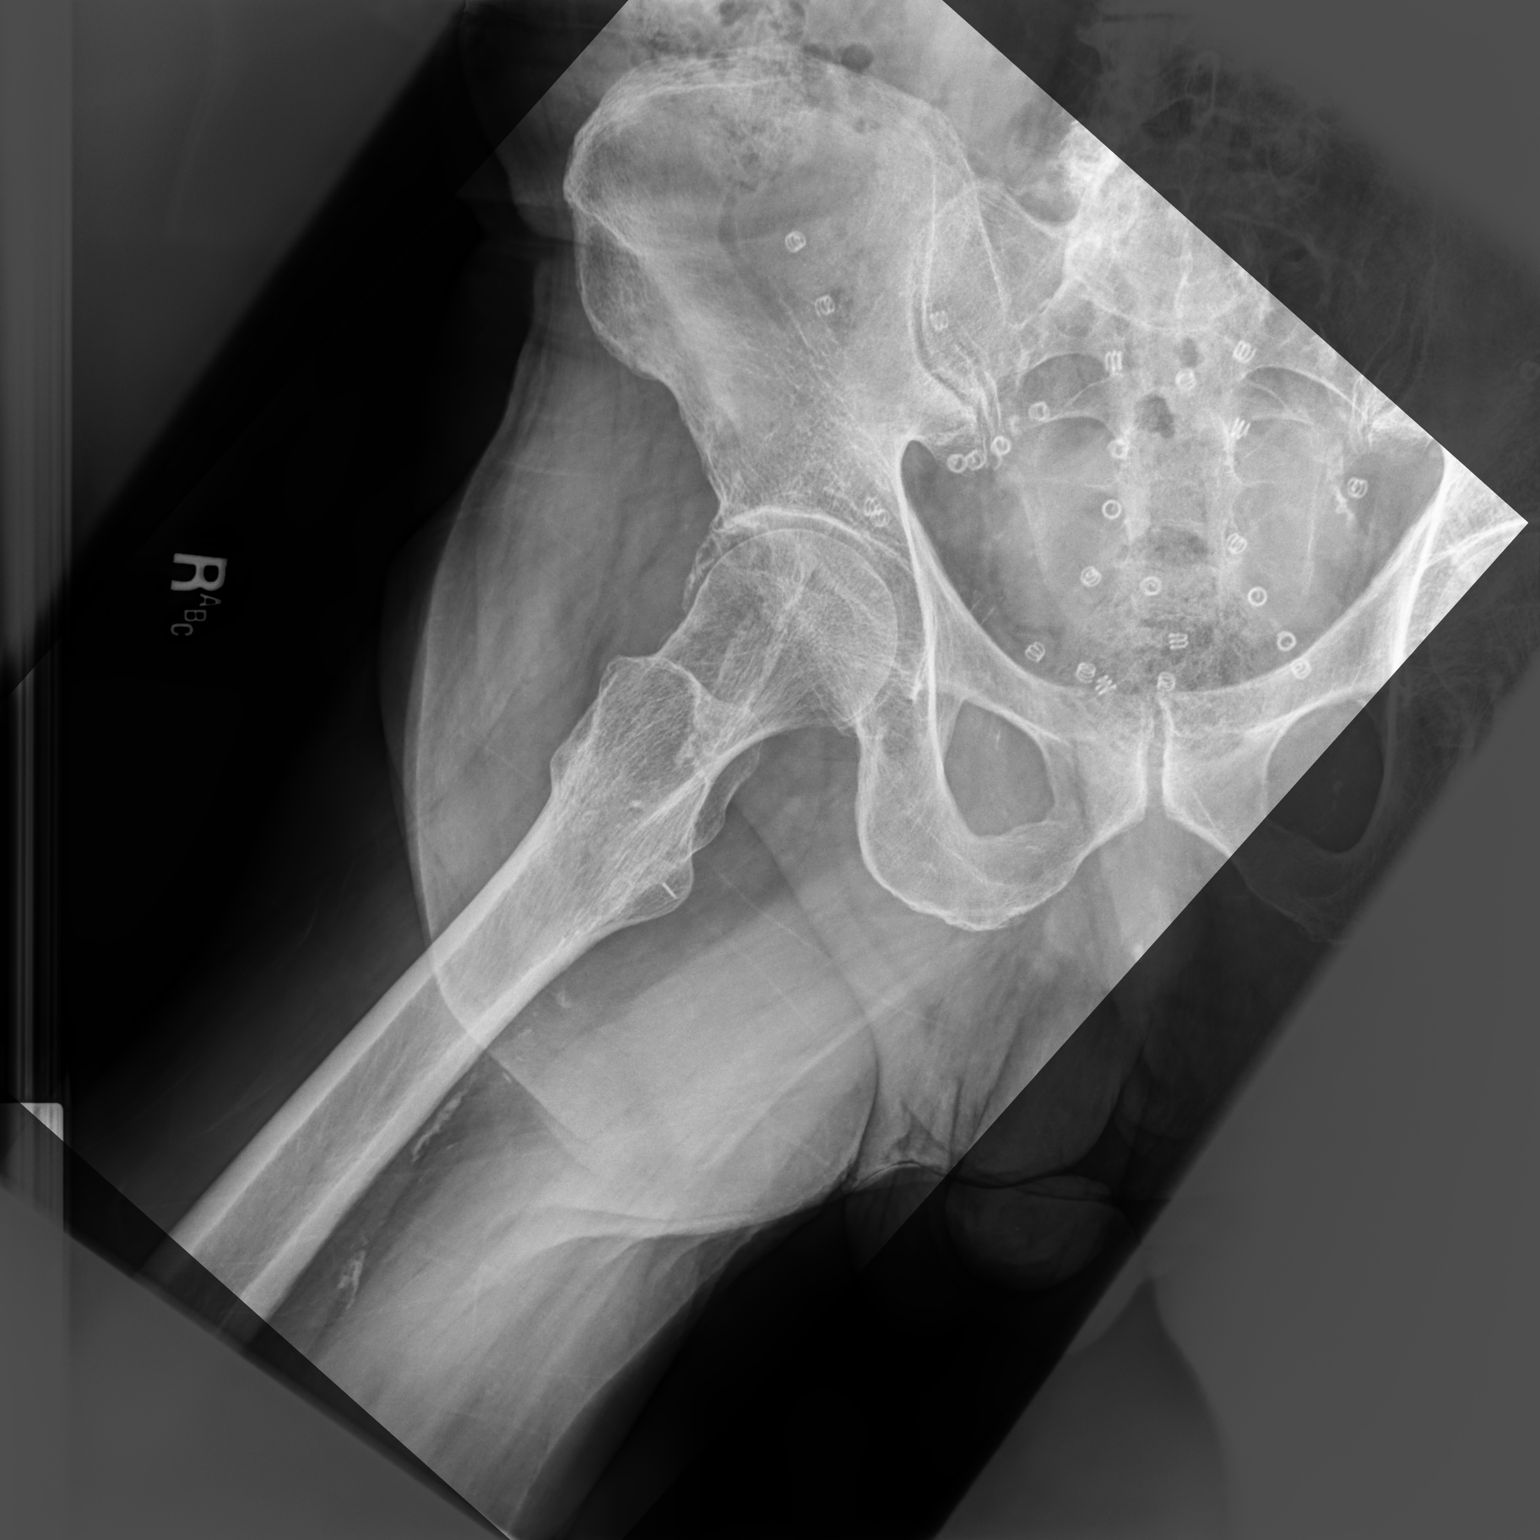

[2 of 2 positions shown; findings below may reference images not displayed]

FINDINGS: Symmetric moderate degenerative changes in the hips bilaterally with
joint space narrowing and spurring. SI joints symmetric and
unremarkable. Cortical lucency noted in the greater trochanter on
the right concerning for greater trochanteric fracture. No extension
across the hip joint visualized. No subluxation or dislocation.
IMPRESSION: Concern for fracture in the right femoral greater trochanteric
region. This could be further evaluated with CT if felt clinically
indicated.

## 2019-05-19 NOTE — Progress Notes (Unsigned)
Reviewed x-ray results with wife.  He has cortical lucency noted in the greater trochanter on the right concerning for possible greater trochanteric fracture.  No extension across the hip joint is visualized.  He is still not wanting to apply weight and we will get CT scan to further assess

## 2019-05-19 NOTE — Progress Notes (Signed)
Subjective:     Patient ID: Miguel Vazquez, male   DOB: 07-05-1940, 79 y.o.   MRN: [id number]  HPI   Miguel Vazquez is seen today for follow-up from recent fall.  Refer to recent note yesterday for details.  Miguel Vazquez states Miguel Vazquez also had another fall last night.  They apparently had a water leak in their house and they state that this involves 5 different rooms.  They have several boxes stacked throughout the house.  His wife states that Miguel Vazquez actually fell off the couch yesterday.  Miguel Vazquez does not recall landing on his right lower extremity.  Yesterday they had reported that Miguel Vazquez walked for a full day after the initial fall a week ago and that Miguel Vazquez had less pain with ambulation and more at rest.  Today apparently is having pain with weightbearing and this is somewhat poorly localized around the right knee region but also extending up to about the mid thigh and femur region and somewhat below the knee.  Miguel Vazquez did not report any other injuries.  History is somewhat unreliable from patient because of his cognitive deficits.  Miguel Vazquez does have a right lateral knee bruise.  They have not noted any significant edema or effusion  Past Medical History:  Diagnosis Date  . Arthritis   . Brain aneurysm 2018   followed by Dr [name]  . Cataract   . Chronic kidney disease    kidney stone- 40 years ago  . Coronary artery disease   . Diabetes mellitus   . GERD (gastroesophageal reflux disease)   . Hx of abdominal aortic aneurysm 1998  . Hyperlipidemia   . Hypertension   . Myocardial infarction ([place])    2016  . Personal history of colonic polyps - adenomas 09/09/2013   Past Surgical History:  Procedure Laterality Date  . ABDOMINAL AORTIC ANEURYSM REPAIR  1998  . CARDIAC CATHETERIZATION N/A 03/18/2015   Procedure: Left Heart Cath and Coronary Angiography;  Surgeon: Peter M Martinique, MD;  Location: [place] [facility];  Service: Cardiovascular;  Laterality: N/A;  . cataract surgery Right   . COLONOSCOPY    . CORONARY ARTERY BYPASS GRAFT N/A  03/18/2015   Procedure: CORONARY ARTERY BYPASS GRAFTING (CABG) x  three, using left internal mammary artery and right leg greater saphenous vein harvested endoscopically;  Surgeon: Melrose Nakayama, MD;  Location: [place];  Service: Open Heart Surgery;  Laterality: N/A;  . INGUINAL HERNIA REPAIR Bilateral   . INTRAOPERATIVE TRANSESOPHAGEAL ECHOCARDIOGRAM N/A 03/18/2015   Procedure: INTRAOPERATIVE TRANSESOPHAGEAL ECHOCARDIOGRAM;  Surgeon: Melrose Nakayama, MD;  Location: [place];  Service: Open Heart Surgery;  Laterality: N/A;  . open heart surgery  2016  . [place]    reports that Miguel Vazquez quit smoking about 32 years ago. His smoking use included cigarettes. Miguel Vazquez has a 30.00 pack-year smoking history. Miguel Vazquez has never used smokeless tobacco. Miguel Vazquez reports current alcohol use. Miguel Vazquez reports that Miguel Vazquez does not use drugs. family history includes Bone cancer in his mother; Diabetes in his paternal aunt; Down syndrome in his son; Stroke in his son. Allergies  Allergen Reactions  . Food Anaphylaxis    TREE NUTS  . Shellfish Allergy Anaphylaxis  . Prednisone     "psychosis"  . Venomil Honey Bee Venom [Honey Bee Venom] Swelling    Localized swelling     Review of Systems  Constitutional: Negative for chills and fever.  Respiratory: Negative for shortness of breath.   Cardiovascular: Negative for chest pain.  Gastrointestinal: Negative for abdominal pain.  Musculoskeletal: Negative for back pain.  Neurological: Negative for weakness and numbness.       Objective:   Physical Exam Vitals reviewed.  Constitutional:      Appearance: Normal appearance.  Cardiovascular:     Rate and Rhythm: Normal rate.  Musculoskeletal:     Comments: Right knee reveals no effusion.  Miguel Vazquez has bruise over the lateral aspect of the knee extending down to the proximal fibular region.  Very superficial abrasion over the anterior knee.  No cellulitis changes.  No joint tenderness.  Full range of motion.  We  attempted internal and external rotation of the hip with him in the wheelchair as Miguel Vazquez had difficulty getting on the table.  Suboptimal exam but we were able to rotate his hip fairly easily without significant pain.  Miguel Vazquez complains of mid right femur pain but no visible swelling and no localized tenderness.  Neurological:     Mental Status: Miguel Vazquez is alert.        Assessment:     Poorly localized right lower extremity pain mostly around the knee region and proximal with history of 2 recent falls.  Initially ambulating fairly well following initial fall 1 week ago by decreased weightbearing ability since fall yesterday.    patient has some cognitive impairment which makes assessment difficult.  Miguel Vazquez is in no distress at rest    Plan:     -Pain x-rays right hip and right knee. -If negative, focus on Tylenol and gradual weightbearing as tolerated.  They do have a walker and encouraged to use that consistently over the next couple weeks.  Would have low threshold to consider physical therapy at home if Miguel Vazquez is not showing increased weightbearing over the next few days  Eulas Post MD [facility]

## 2019-05-20 ENCOUNTER — Telehealth: Payer: Self-pay | Admitting: Family Medicine

## 2019-05-20 ENCOUNTER — Ambulatory Visit (HOSPITAL_BASED_OUTPATIENT_CLINIC_OR_DEPARTMENT_OTHER)
Admission: RE | Admit: 2019-05-20 | Discharge: 2019-05-20 | Disposition: A | Discharge status: Home / Self Care | Payer: PPO | Source: Ambulatory Visit | Attending: Family Medicine | Admitting: Family Medicine

## 2019-05-20 DIAGNOSIS — S72114A Nondisplaced fracture of greater trochanter of right femur, initial encounter for closed fracture: Secondary | ICD-10-CM | POA: Diagnosis not present

## 2019-05-20 DIAGNOSIS — M79651 Pain in right thigh: Secondary | ICD-10-CM | POA: Diagnosis not present

## 2019-05-20 IMAGING — CT CT HIP*R* W/O CM
2 of 3 series · 17 of 46 positions shown, 19 images · non-contrast
Comparison: Radiographs [DATE]

CLINICAL DATA: Recent falls. Hip pain.

EXAM:
CT OF THE RIGHT HIP WITHOUT CONTRAST
TECHNIQUE: Multidetector CT imaging of the right hip was performed according to
the standard protocol. Multiplanar CT image reconstructions were
also generated.

[Series 5: coronal st · coronal · 0.39mm/px · 3 of 98 slices shown]
[im 33/98  soft-tissue]
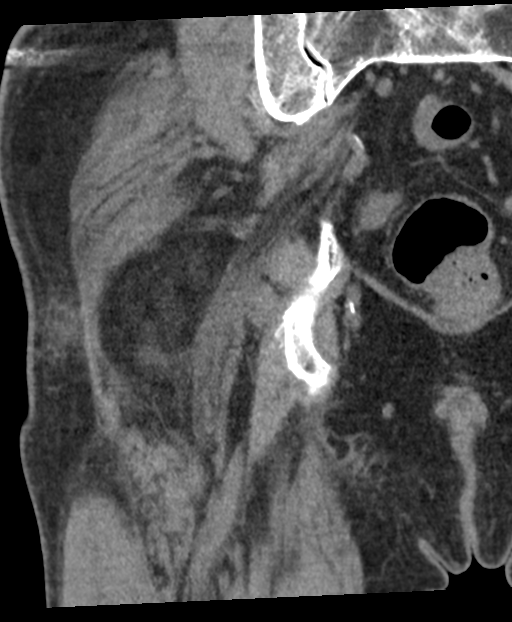
[im 44/98  soft-tissue]
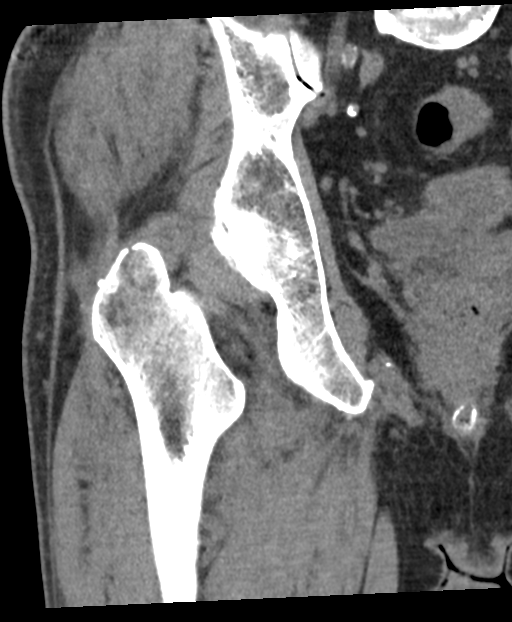
[im 54/98  soft-tissue]
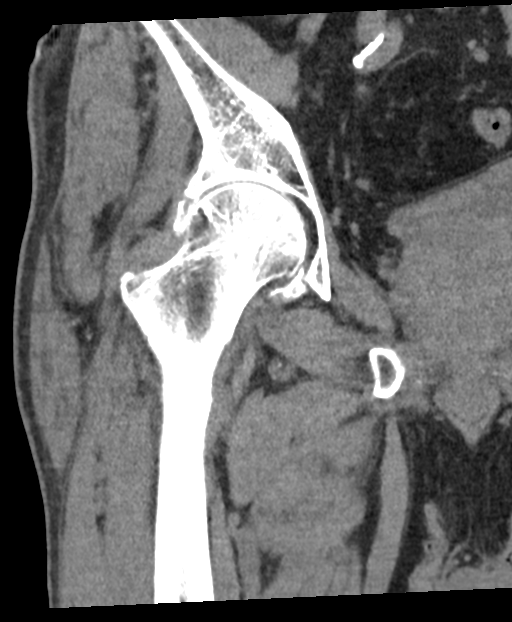

[Series 9: axial soft tissue · axial · 0.42mm/px · z∈[-276,-80]mm · 14 of 114 slices shown, 16 images]
[im 8/114  soft-tissue]
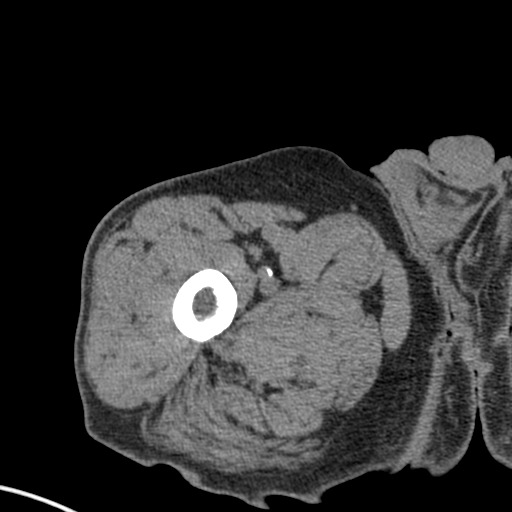
[im 8/114  bone]
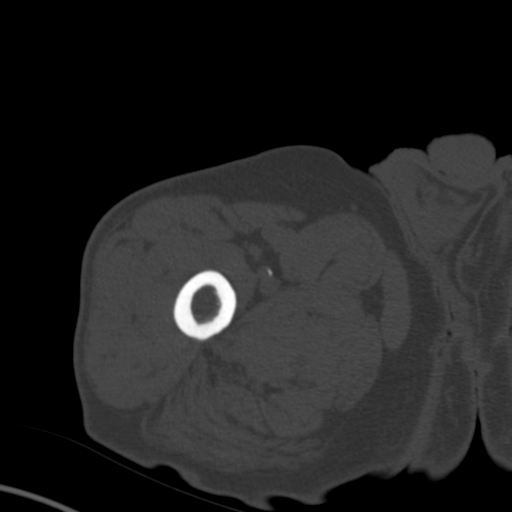
[im 15/114  soft-tissue]
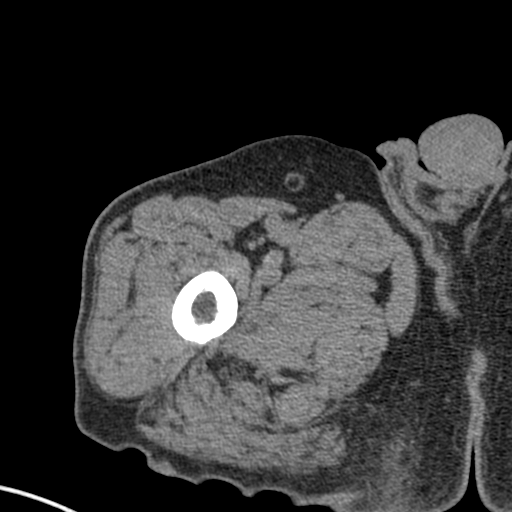
[im 22/114  soft-tissue]
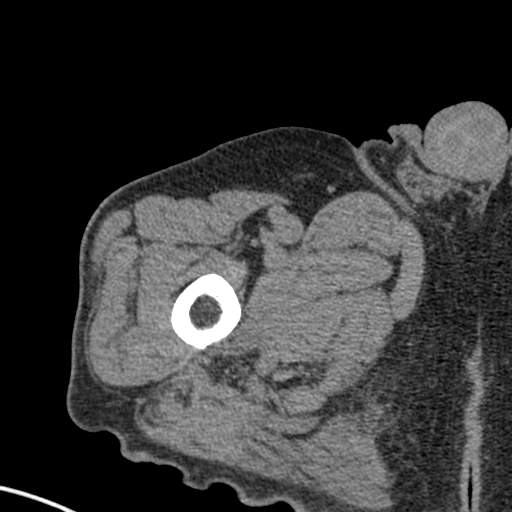
[im 30/114  soft-tissue]
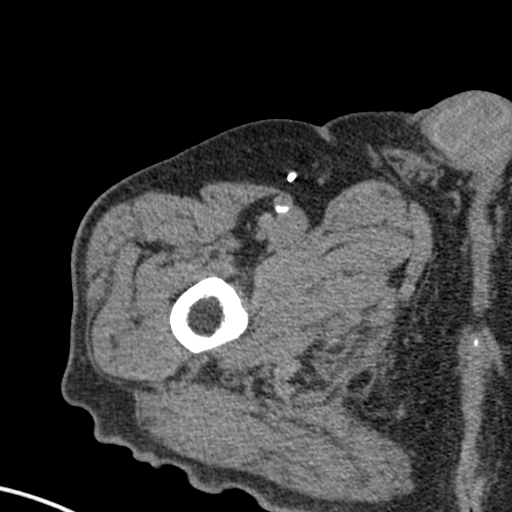
[im 37/114  soft-tissue]
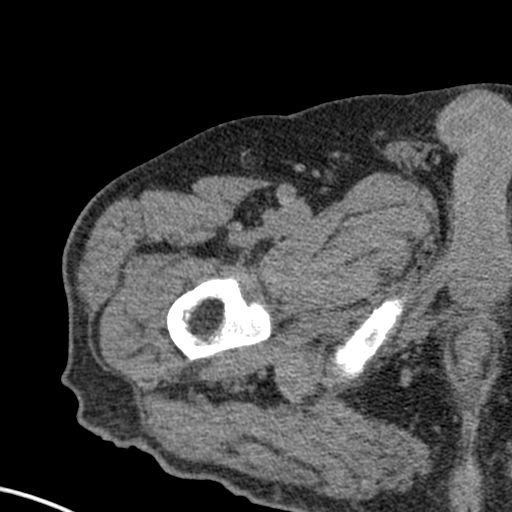
[im 44/114  soft-tissue]
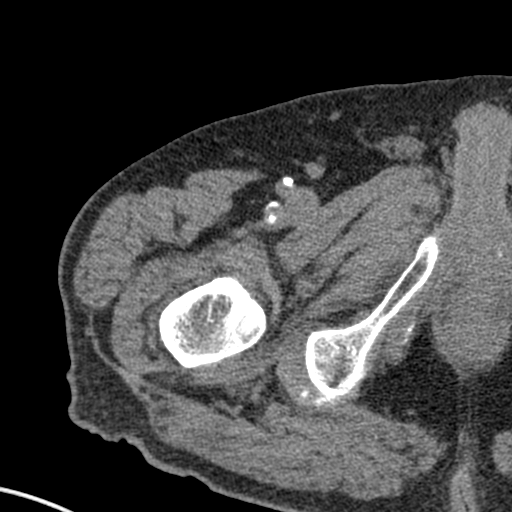
[im 52/114  soft-tissue]
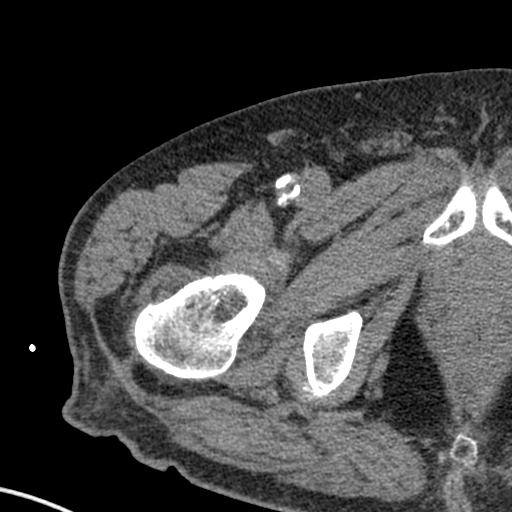
[im 62/114  soft-tissue]
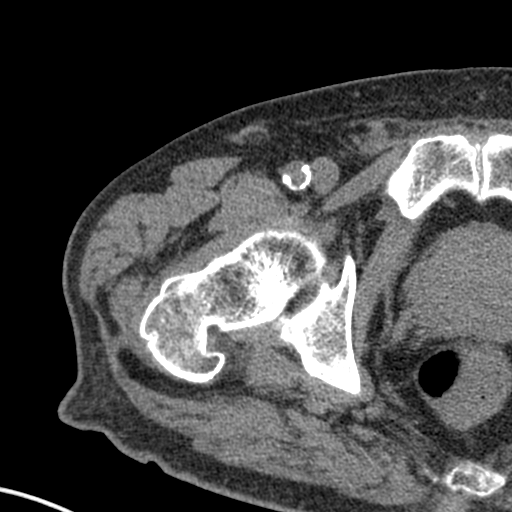
[im 70/114  soft-tissue]
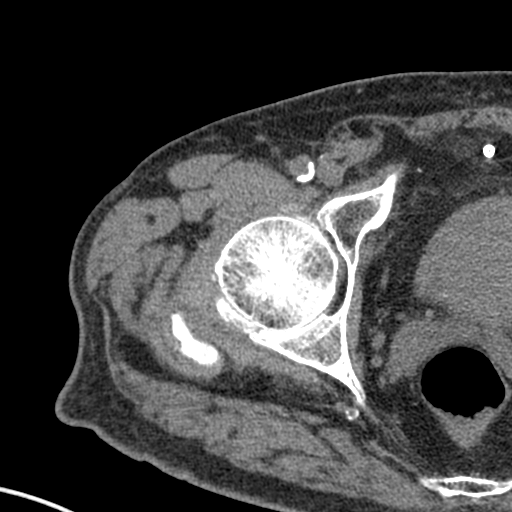
[im 70/114  bone]
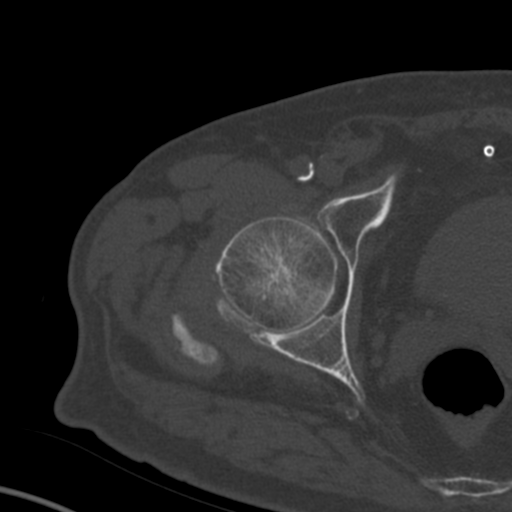
[im 77/114  soft-tissue]
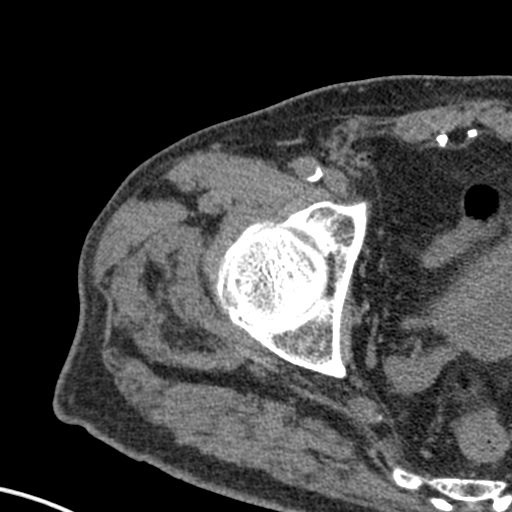
[im 84/114  soft-tissue]
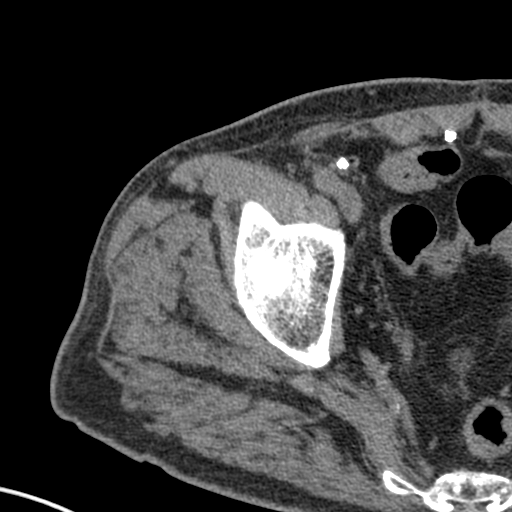
[im 92/114  soft-tissue]
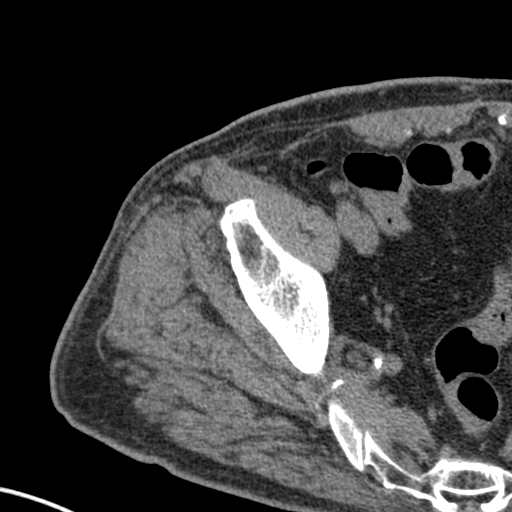
[im 99/114  soft-tissue]
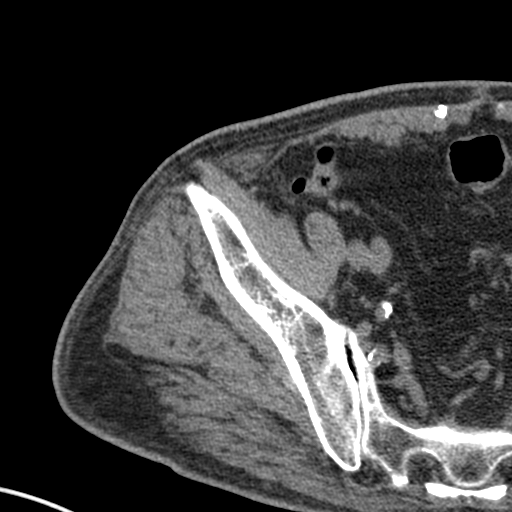
[im 106/114  soft-tissue]
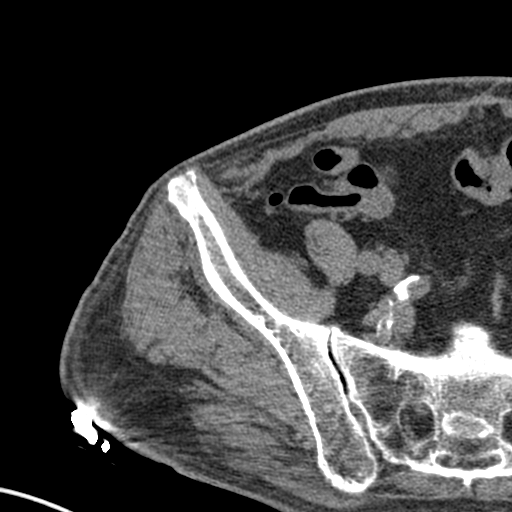

[17 of 46 positions shown; findings below may reference images not displayed]

FINDINGS: Isolated nondisplaced fracture involving the greater trochanter. No
intertrochanteric fracture or femoral neck fracture. Associated
surrounding soft tissue swelling/edema/fluid.

Moderate degenerative joint disease with joint space narrowing and
osteophytic spurring and subchondral cystic change. There is also
extensive chondrocalcinosis. No obvious hip joint effusion.

The visualized right hemipelvis is intact.

No significant intrapelvic abnormalities. Surgical changes from
anterior abdominal wall hernia repair with mesh. There is a small
right inguinal hernia containing fat and the appendix is at the
opening of the hernia.

Extensive arterial calcifications are noted.
IMPRESSION: 1. Isolated nondisplaced fracture involving the greater trochanter.
2. No intertrochanteric fracture or femoral neck fracture.
3. Moderate degenerative joint disease and chondrocalcinosis.

## 2019-05-20 NOTE — Telephone Encounter (Signed)
If we cannot get outpt CT hip today, only other options are ER or orthopedic assessment (but not sure we can get him in).

## 2019-05-20 NOTE — Telephone Encounter (Signed)
[name] called in and stated that [name] wanted Trenten to get a CT scan of his hip. She reached at [place] and can not do it today won't be able to until Monday. She is wondering what [name] would like him to do?  [name] can be reached at [phone]

## 2019-05-20 NOTE — Telephone Encounter (Signed)
Called patient and spoke to wife and gave her the message from Dr. Elease Hashimoto. Wife stated that she is going to try to call over at the [facility] location on [address] since she has had a CT scan there before.

## 2019-05-20 NOTE — Telephone Encounter (Signed)
Please see message. °

## 2019-05-20 NOTE — Telephone Encounter (Signed)
Pt wife wants to know when CT scan will be put in, it suppose to be bad weather tomorrow and wants to know something today.

## 2019-05-20 NOTE — Telephone Encounter (Signed)
Called patient and spoke to wife and gave her the information for the [place] location for the CT scan and the contact number of [phone]. Wife verbalized an understanding.

## 2019-05-25 ENCOUNTER — Other Ambulatory Visit: Payer: PPO

## 2019-05-26 ENCOUNTER — Telehealth: Payer: Self-pay | Admitting: Family Medicine

## 2019-05-26 NOTE — Progress Notes (Signed)
  Chronic Care Management   Outreach Note  05/26/2019 Name: Miguel Vazquez MRN: [id number] DOB: [birth year]  Referred by: Eulas Post, MD Reason for referral : No chief complaint on file.   An unsuccessful telephone outreach was attempted today. The patient was referred to the pharmacist for assistance with care management and care coordination.   Follow Up Plan:   Raynicia Dukes [employer] Scheduler

## 2019-05-26 NOTE — Telephone Encounter (Signed)
Pt's wife, [name], would like to know if he has to go to an orthopedics. Per wife, [name], pt  is doing better and walking. Thanks

## 2019-05-26 NOTE — Telephone Encounter (Signed)
Spoke with patient's wife and informed her that referral was placed Friday and as soon as it is completed someone from the Orthopedic office will be calling to schedule an appointment. [name] verbalized understanding. Nothing further needed at this time.

## 2019-05-31 ENCOUNTER — Ambulatory Visit: Payer: PPO | Attending: Internal Medicine

## 2019-05-31 DIAGNOSIS — Z23 Encounter for immunization: Secondary | ICD-10-CM | POA: Insufficient documentation

## 2019-05-31 NOTE — Progress Notes (Signed)
   Covid-19 Vaccination Clinic  Name:  Miguel Vazquez    MRN: [id number] DOB: 15-Sep-1940  05/31/2019  [name] was observed post Covid-19 immunization for 15 minutes without incidence. He was provided with Vaccine Information Sheet and instruction to access the V-Safe system.   [name] was instructed to call 911 with any severe reactions post vaccine: Marland Kitchen Difficulty breathing  . Swelling of your face and throat  . A fast heartbeat  . A bad rash all over your body  . Dizziness and weakness    Immunizations Administered    Name Date Dose VIS Date Route   Pfizer COVID-19 Vaccine 05/31/2019  2:56 PM 0.3 mL 03/13/2019 Intramuscular   Manufacturer: [place]   Lot: [id number]   [place]: [id number]

## 2019-06-10 ENCOUNTER — Other Ambulatory Visit: Payer: Self-pay | Admitting: Family Medicine

## 2019-06-10 ENCOUNTER — Telehealth: Payer: Self-pay | Admitting: Family Medicine

## 2019-06-10 NOTE — Chronic Care Management (AMB) (Signed)
  Chronic Care Management   Note  06/10/2019 Name: Miguel Vazquez MRN: [id number] DOB: 01-Jun-1940  Miguel Vazquez is a 79 y.o. year old male who is a primary care patient of [name], [name], MD. I reached out to [name] by phone today in response to a referral sent by [name]'s PCP, Eulas Post, MD.   [name] was given information about Chronic Care Management services today including:  1. CCM service includes personalized support from designated clinical staff supervised by his physician, including individualized plan of care and coordination with other care providers 2. 24/7 contact phone numbers for assistance for urgent and routine care needs. 3. Service will only be billed when office clinical staff spend 20 minutes or more in a month to coordinate care. 4. Only one practitioner may furnish and bill the service in a calendar month. 5. The patient may stop CCM services at any time (effective at the end of the month) by phone call to the office staff.   Patient agreed to services and verbal consent obtained.   Follow up plan:   Raynicia Dukes [employer] Scheduler

## 2019-06-10 NOTE — Telephone Encounter (Signed)
Patient has an appointment on 06/15/19 to see if he needs to increase this medication to 10 mg.

## 2019-06-15 ENCOUNTER — Encounter: Payer: Self-pay | Admitting: Family Medicine

## 2019-06-15 ENCOUNTER — Other Ambulatory Visit: Payer: Self-pay

## 2019-06-15 ENCOUNTER — Ambulatory Visit (INDEPENDENT_AMBULATORY_CARE_PROVIDER_SITE_OTHER): Payer: PPO | Admitting: Family Medicine

## 2019-06-15 VITALS — BP 126/68 | HR 76 | Temp 96.7°F | Wt 152.3 lb

## 2019-06-15 DIAGNOSIS — R413 Other amnesia: Secondary | ICD-10-CM

## 2019-06-15 DIAGNOSIS — E1121 Type 2 diabetes mellitus with diabetic nephropathy: Secondary | ICD-10-CM | POA: Diagnosis not present

## 2019-06-15 DIAGNOSIS — E7800 Pure hypercholesterolemia, unspecified: Secondary | ICD-10-CM | POA: Diagnosis not present

## 2019-06-15 LAB — POCT GLYCOSYLATED HEMOGLOBIN (HGB A1C): Hemoglobin A1C: 6.2 % — AB (ref 4.0–5.6)

## 2019-06-15 MED ORDER — DONEPEZIL HCL 10 MG PO TABS: 10.0000 mg | ORAL_TABLET | Freq: Every day | ORAL | 3 refills | Status: DC

## 2019-06-15 NOTE — Progress Notes (Signed)
Subjective:     Patient ID: Miguel Vazquez, male   DOB: February 23, 1941, 79 y.o.   MRN: [id number]  HPI Miguel Vazquez is here for follow-up regarding his cognitive impairment.  He scored 19 out of 30 on MMSE recently.  They lost their son who had Down syndrome last year and initially had some concerns whether depression may be exacerbating his symptoms.  He had been placed on Lexapro but they did not see any improvement.  Lexapro was discontinued.  We started Aricept 5 mg daily.  His wife thinks that she has seen some improvements thus far.  He is not any adverse side effects.  We explained our goal to titrate to target dose of 10 mg daily at this time.  His other chronic problems include history of CAD, hypertension, cerebral aneurysm, hypothyroidism, type 2 diabetes, hyperlipidemia  Last A1c was 6.5% back in August.  His lipids were checked then and thyroid was at goal back in November.  Medications reviewed and compliant with all.  He had recent fall with evidence for possible small avulsion off the greater trochanter.  For some reason orthopedic appointment was never made the referral has been placed.  Wife at this point would rather observe since he is getting around good with no further hip pain.  Ambulating without difficulty.  He had no evidence for intertrochanteric involvement based on CT scan  Past Medical History:  Diagnosis Date  . Arthritis   . Brain aneurysm 2018   followed by Dr [name]  . Cataract   . Chronic kidney disease    kidney stone- 40 years ago  . Coronary artery disease   . Diabetes mellitus   . GERD (gastroesophageal reflux disease)   . Hx of abdominal aortic aneurysm 1998  . Hyperlipidemia   . Hypertension   . Myocardial infarction ([place])    2016  . Personal history of colonic polyps - adenomas 09/09/2013   Past Surgical History:  Procedure Laterality Date  . ABDOMINAL AORTIC ANEURYSM REPAIR  1998  . CARDIAC CATHETERIZATION N/A 03/18/2015   Procedure: Left Heart Cath  and Coronary Angiography;  Surgeon: Peter M Martinique, MD;  Location: [facility];  Service: Cardiovascular;  Laterality: N/A;  . cataract surgery Right   . COLONOSCOPY    . CORONARY ARTERY BYPASS GRAFT N/A 03/18/2015   Procedure: CORONARY ARTERY BYPASS GRAFTING (CABG) x  three, using left internal mammary artery and right leg greater saphenous vein harvested endoscopically;  Surgeon: Melrose Nakayama, MD;  Location: [facility];  Service: Open Heart Surgery;  Laterality: N/A;  . INGUINAL HERNIA REPAIR Bilateral   . INTRAOPERATIVE TRANSESOPHAGEAL ECHOCARDIOGRAM N/A 03/18/2015   Procedure: INTRAOPERATIVE TRANSESOPHAGEAL ECHOCARDIOGRAM;  Surgeon: Melrose Nakayama, MD;  Location: [facility];  Service: Open Heart Surgery;  Laterality: N/A;  . open heart surgery  2016  . [place]    reports that he quit smoking about 32 years ago. His smoking use included cigarettes. He has a 30.00 pack-year smoking history. He has never used smokeless tobacco. He reports current alcohol use. He reports that he does not use drugs. family history includes Bone cancer in his mother; Diabetes in his paternal aunt; Down syndrome in his son; Stroke in his son. Allergies  Allergen Reactions  . Food Anaphylaxis    TREE NUTS  . Shellfish Allergy Anaphylaxis  . Prednisone     "psychosis"  . Venomil Honey Bee Venom [Honey Bee Venom] Swelling    Localized swelling  Review of Systems  Constitutional: Negative for fatigue.  Eyes: Negative for visual disturbance.  Respiratory: Negative for cough, chest tightness and shortness of breath.   Cardiovascular: Negative for chest pain, palpitations and leg swelling.  Gastrointestinal: Negative for abdominal pain, nausea and vomiting.  Genitourinary: Negative for dysuria.  Neurological: Negative for dizziness, syncope, weakness, light-headedness and headaches.  Psychiatric/Behavioral: Negative for agitation.       Objective:   Physical  Exam Vitals reviewed.  Constitutional:      Appearance: Normal appearance.  Cardiovascular:     Rate and Rhythm: Normal rate and regular rhythm.  Pulmonary:     Effort: Pulmonary effort is normal.     Breath sounds: Normal breath sounds.  Musculoskeletal:     Right lower leg: No edema.     Left lower leg: No edema.  Neurological:     Mental Status: He is alert.     Cranial Nerves: No cranial nerve deficit.        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  Subjectively improved on low-dose Aricept per wife.  They would like to consider further titration at this time  #2 type 2 diabetes.  History of good control.  A1c today 6.2%.  #3 dyslipidemia with goal LDL less than 70    Plan:     -Recheck A1c today -Increase Aricept to 10 mg daily -We will plan 31-month follow-up.  Wife had questions regarding Namenda.  She would like to see his response to higher dose Aricept first.  Consider follow-up MMSE in 3 months  [facility] '

## 2019-06-22 ENCOUNTER — Telehealth: Payer: Self-pay

## 2019-06-22 DIAGNOSIS — R4189 Other symptoms and signs involving cognitive functions and awareness: Secondary | ICD-10-CM

## 2019-06-22 DIAGNOSIS — E1121 Type 2 diabetes mellitus with diabetic nephropathy: Secondary | ICD-10-CM

## 2019-06-22 NOTE — Telephone Encounter (Signed)
OK to refer.

## 2019-06-22 NOTE — Telephone Encounter (Signed)
I am requesting an ambulatory referral to CCM be placed for this patient. Referral will need to have 2 current diagnosis attached to it. Thank you!  

## 2019-06-23 ENCOUNTER — Telehealth: Payer: PPO

## 2019-06-23 NOTE — Telephone Encounter (Signed)
Referral has been placed. 

## 2019-06-24 ENCOUNTER — Encounter (HOSPITAL_COMMUNITY): Payer: Self-pay

## 2019-06-24 ENCOUNTER — Encounter: Payer: Self-pay | Admitting: Family Medicine

## 2019-06-24 ENCOUNTER — Emergency Department (HOSPITAL_COMMUNITY): Payer: PPO

## 2019-06-24 ENCOUNTER — Other Ambulatory Visit: Payer: Self-pay

## 2019-06-24 ENCOUNTER — Inpatient Hospital Stay (HOSPITAL_COMMUNITY)
Admission: EM | Admit: 2019-06-24 | Discharge: 2019-06-26 | DRG: 377 | Disposition: A | Discharge status: Home Health | Payer: PPO | Attending: Internal Medicine | Admitting: Internal Medicine

## 2019-06-24 ENCOUNTER — Ambulatory Visit (INDEPENDENT_AMBULATORY_CARE_PROVIDER_SITE_OTHER): Payer: PPO | Admitting: Family Medicine

## 2019-06-24 VITALS — BP 98/60 | HR 110 | Temp 97.7°F | Wt 153.3 lb

## 2019-06-24 DIAGNOSIS — I110 Hypertensive heart disease with heart failure: Secondary | ICD-10-CM | POA: Diagnosis present

## 2019-06-24 DIAGNOSIS — Z888 Allergy status to other drugs, medicaments and biological substances status: Secondary | ICD-10-CM

## 2019-06-24 DIAGNOSIS — E785 Hyperlipidemia, unspecified: Secondary | ICD-10-CM | POA: Diagnosis present

## 2019-06-24 DIAGNOSIS — R531 Weakness: Secondary | ICD-10-CM

## 2019-06-24 DIAGNOSIS — E039 Hypothyroidism, unspecified: Secondary | ICD-10-CM | POA: Diagnosis not present

## 2019-06-24 DIAGNOSIS — S72114A Nondisplaced fracture of greater trochanter of right femur, initial encounter for closed fracture: Secondary | ICD-10-CM | POA: Diagnosis present

## 2019-06-24 DIAGNOSIS — W19XXXA Unspecified fall, initial encounter: Secondary | ICD-10-CM | POA: Diagnosis present

## 2019-06-24 DIAGNOSIS — I252 Old myocardial infarction: Secondary | ICD-10-CM

## 2019-06-24 DIAGNOSIS — Z8679 Personal history of other diseases of the circulatory system: Secondary | ICD-10-CM | POA: Diagnosis not present

## 2019-06-24 DIAGNOSIS — Z20822 Contact with and (suspected) exposure to covid-19: Secondary | ICD-10-CM | POA: Diagnosis not present

## 2019-06-24 DIAGNOSIS — E119 Type 2 diabetes mellitus without complications: Secondary | ICD-10-CM | POA: Diagnosis present

## 2019-06-24 DIAGNOSIS — K862 Cyst of pancreas: Secondary | ICD-10-CM | POA: Diagnosis not present

## 2019-06-24 DIAGNOSIS — I255 Ischemic cardiomyopathy: Secondary | ICD-10-CM | POA: Diagnosis present

## 2019-06-24 DIAGNOSIS — K5732 Diverticulitis of large intestine without perforation or abscess without bleeding: Secondary | ICD-10-CM | POA: Diagnosis not present

## 2019-06-24 DIAGNOSIS — S7290XA Unspecified fracture of unspecified femur, initial encounter for closed fracture: Secondary | ICD-10-CM

## 2019-06-24 DIAGNOSIS — I5042 Chronic combined systolic (congestive) and diastolic (congestive) heart failure: Secondary | ICD-10-CM | POA: Diagnosis not present

## 2019-06-24 DIAGNOSIS — Z79899 Other long term (current) drug therapy: Secondary | ICD-10-CM

## 2019-06-24 DIAGNOSIS — Z79890 Hormone replacement therapy (postmenopausal): Secondary | ICD-10-CM

## 2019-06-24 DIAGNOSIS — N320 Bladder-neck obstruction: Secondary | ICD-10-CM | POA: Diagnosis not present

## 2019-06-24 DIAGNOSIS — K5792 Diverticulitis of intestine, part unspecified, without perforation or abscess without bleeding: Secondary | ICD-10-CM

## 2019-06-24 DIAGNOSIS — Z7984 Long term (current) use of oral hypoglycemic drugs: Secondary | ICD-10-CM

## 2019-06-24 DIAGNOSIS — K5753 Diverticulitis of both small and large intestine without perforation or abscess with bleeding: Secondary | ICD-10-CM | POA: Diagnosis not present

## 2019-06-24 DIAGNOSIS — N200 Calculus of kidney: Secondary | ICD-10-CM | POA: Diagnosis not present

## 2019-06-24 DIAGNOSIS — Z7982 Long term (current) use of aspirin: Secondary | ICD-10-CM

## 2019-06-24 DIAGNOSIS — I671 Cerebral aneurysm, nonruptured: Secondary | ICD-10-CM | POA: Diagnosis present

## 2019-06-24 DIAGNOSIS — Z87442 Personal history of urinary calculi: Secondary | ICD-10-CM

## 2019-06-24 DIAGNOSIS — D62 Acute posthemorrhagic anemia: Secondary | ICD-10-CM | POA: Diagnosis not present

## 2019-06-24 DIAGNOSIS — K922 Gastrointestinal hemorrhage, unspecified: Secondary | ICD-10-CM | POA: Diagnosis not present

## 2019-06-24 DIAGNOSIS — K6289 Other specified diseases of anus and rectum: Secondary | ICD-10-CM | POA: Diagnosis present

## 2019-06-24 DIAGNOSIS — F329 Major depressive disorder, single episode, unspecified: Secondary | ICD-10-CM | POA: Diagnosis not present

## 2019-06-24 DIAGNOSIS — K8020 Calculus of gallbladder without cholecystitis without obstruction: Secondary | ICD-10-CM | POA: Diagnosis present

## 2019-06-24 DIAGNOSIS — Z951 Presence of aortocoronary bypass graft: Secondary | ICD-10-CM

## 2019-06-24 DIAGNOSIS — Z86010 Personal history of colonic polyps: Secondary | ICD-10-CM

## 2019-06-24 DIAGNOSIS — K219 Gastro-esophageal reflux disease without esophagitis: Secondary | ICD-10-CM | POA: Diagnosis present

## 2019-06-24 DIAGNOSIS — I951 Orthostatic hypotension: Secondary | ICD-10-CM | POA: Diagnosis not present

## 2019-06-24 DIAGNOSIS — I2510 Atherosclerotic heart disease of native coronary artery without angina pectoris: Secondary | ICD-10-CM | POA: Diagnosis not present

## 2019-06-24 DIAGNOSIS — Z87448 Personal history of other diseases of urinary system: Secondary | ICD-10-CM

## 2019-06-24 DIAGNOSIS — I10 Essential (primary) hypertension: Secondary | ICD-10-CM | POA: Diagnosis present

## 2019-06-24 DIAGNOSIS — R935 Abnormal findings on diagnostic imaging of other abdominal regions, including retroperitoneum: Secondary | ICD-10-CM | POA: Diagnosis not present

## 2019-06-24 DIAGNOSIS — S72141A Displaced intertrochanteric fracture of right femur, initial encounter for closed fracture: Secondary | ICD-10-CM | POA: Diagnosis not present

## 2019-06-24 DIAGNOSIS — R42 Dizziness and giddiness: Secondary | ICD-10-CM | POA: Diagnosis not present

## 2019-06-24 DIAGNOSIS — K5731 Diverticulosis of large intestine without perforation or abscess with bleeding: Secondary | ICD-10-CM | POA: Diagnosis not present

## 2019-06-24 DIAGNOSIS — Z87891 Personal history of nicotine dependence: Secondary | ICD-10-CM

## 2019-06-24 DIAGNOSIS — Z8719 Personal history of other diseases of the digestive system: Secondary | ICD-10-CM

## 2019-06-24 DIAGNOSIS — Z03818 Encounter for observation for suspected exposure to other biological agents ruled out: Secondary | ICD-10-CM | POA: Diagnosis not present

## 2019-06-24 DIAGNOSIS — Z823 Family history of stroke: Secondary | ICD-10-CM

## 2019-06-24 DIAGNOSIS — Z833 Family history of diabetes mellitus: Secondary | ICD-10-CM

## 2019-06-24 LAB — CBC
HCT: 35.2 % — ABNORMAL LOW (ref 39.0–52.0)
Hemoglobin: 11.8 g/dL — ABNORMAL LOW (ref 13.0–17.0)
MCH: 33.5 pg (ref 26.0–34.0)
MCHC: 33.5 g/dL (ref 30.0–36.0)
MCV: 100 fL (ref 80.0–100.0)
Platelets: 203 10*3/uL (ref 150–400)
RBC: 3.52 MIL/uL — ABNORMAL LOW (ref 4.22–5.81)
RDW: 13.7 % (ref 11.5–15.5)
WBC: 7.9 10*3/uL (ref 4.0–10.5)
nRBC: 0 % (ref 0.0–0.2)

## 2019-06-24 LAB — COMPREHENSIVE METABOLIC PANEL
ALT: 23 U/L (ref 0–44)
AST: 22 U/L (ref 15–41)
Albumin: 3.6 g/dL (ref 3.5–5.0)
Alkaline Phosphatase: 67 U/L (ref 38–126)
Anion gap: 9 (ref 5–15)
BUN: 23 mg/dL (ref 8–23)
CO2: 26 mmol/L (ref 22–32)
Calcium: 9.3 mg/dL (ref 8.9–10.3)
Chloride: 104 mmol/L (ref 98–111)
Creatinine, Ser: 1.07 mg/dL (ref 0.61–1.24)
GFR calc Af Amer: >60 mL/min (ref >60)
GFR calc non Af Amer: >60 mL/min (ref >60)
Glucose, Bld: 198 mg/dL — ABNORMAL HIGH (ref 70–99)
Potassium: 4.3 mmol/L (ref 3.5–5.1)
Sodium: 139 mmol/L (ref 135–145)
Total Bilirubin: 0.5 mg/dL (ref 0.3–1.2)
Total Protein: 6.6 g/dL (ref 6.5–8.1)

## 2019-06-24 LAB — OCCULT BLOOD X 1 CARD TO LAB, STOOL: Fecal Occult Bld: POSITIVE — AB

## 2019-06-24 IMAGING — CT CT CTA ABD/PEL W/CM AND/OR W/O CM
3 of 11 series · 11 of 46 positions shown, 15 images · IV contrast (APPLIED)
Comparison: CT abdomen pelvis dated [DATE].

CLINICAL DATA: 78-year-old male with GI bleed. Right red blood per
rectum.

EXAM:
CTA ABDOMEN AND PELVIS WITHOUT AND WITH CONTRAST
TECHNIQUE: Multidetector CT imaging of the abdomen and pelvis was performed
using the standard protocol during bolus administration of
intravenous contrast. Multiplanar reconstructed images and MIPs were
obtained and reviewed to evaluate the vascular anatomy.
CONTRAST:  100mL OMNIPAQUE IOHEXOL 350 MG/ML SOLN

[Series 5: arterial · axial · arterial · 0.80mm/px · z∈[-495,-159]mm · 7 of 240 slices shown]
[im 24/240  soft-tissue]
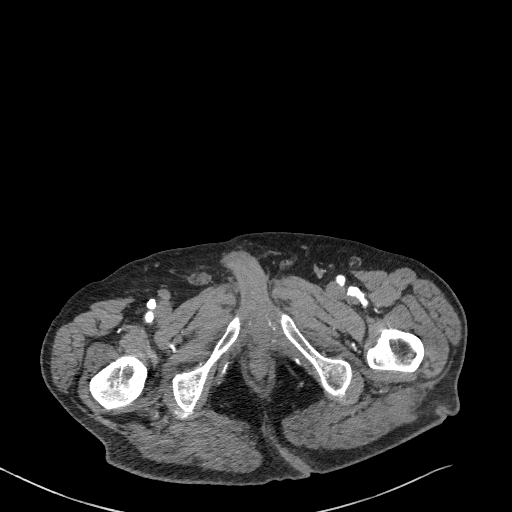
[im 48/240  soft-tissue]
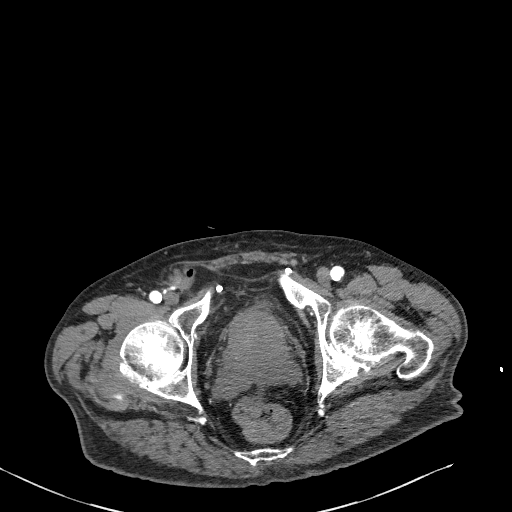
[im 72/240  soft-tissue]
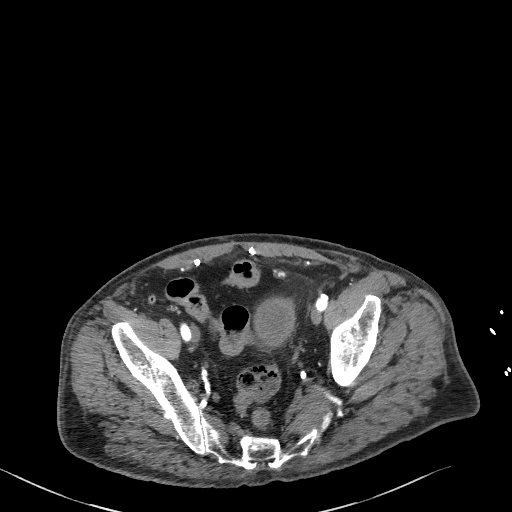
[im 96/240  soft-tissue]
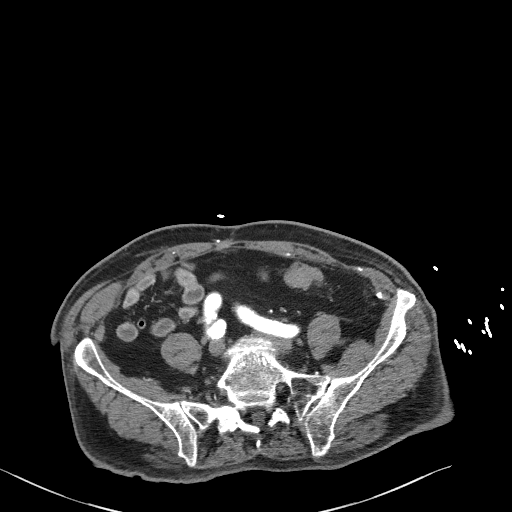
[im 144/240  soft-tissue]
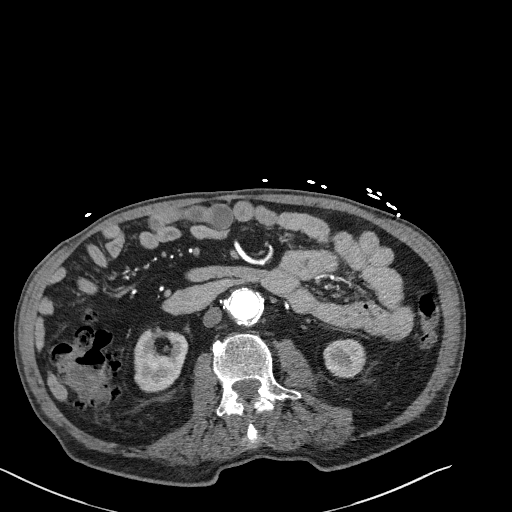
[im 168/240  soft-tissue]
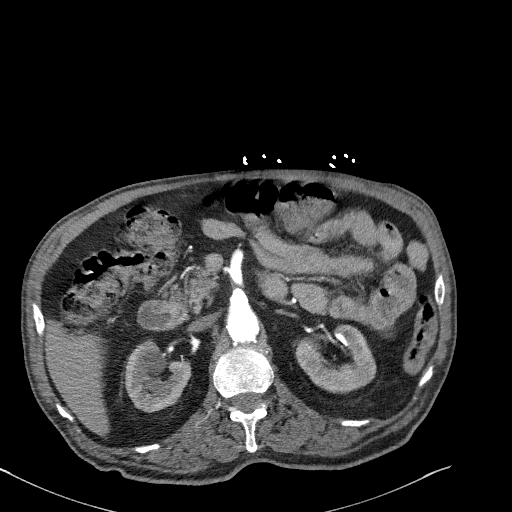
[im 192/240  soft-tissue]
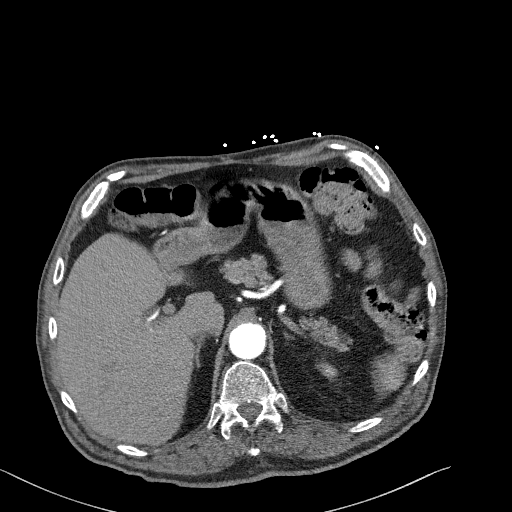

[Series 8: portal venous · axial · portal-venous · 0.80mm/px · z∈[-383,-223]mm · 2 of 96 slices shown, 5 images]
[im 32/96  soft-tissue]
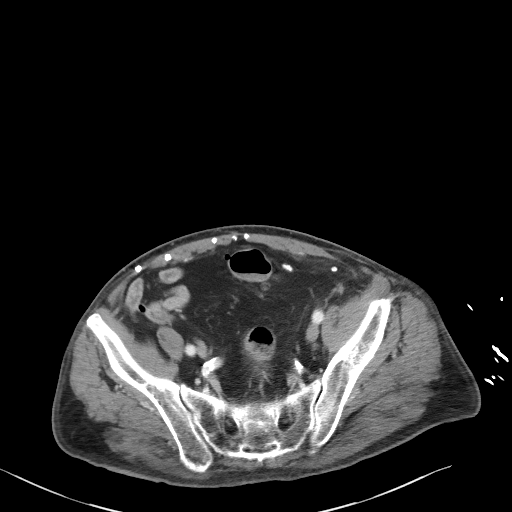
[im 32/96  lung]
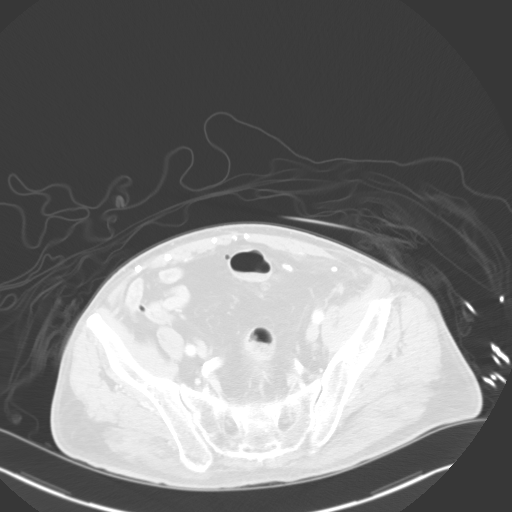
[im 32/96  bone]
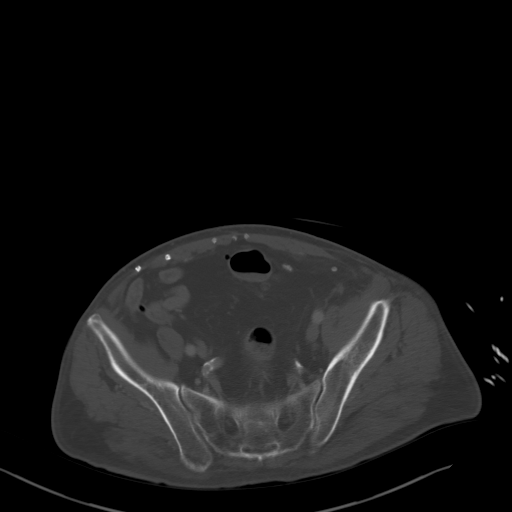
[im 64/96  soft-tissue]
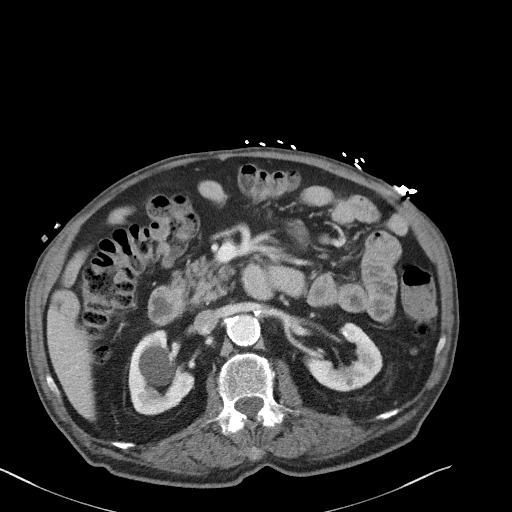
[im 64/96  lung]
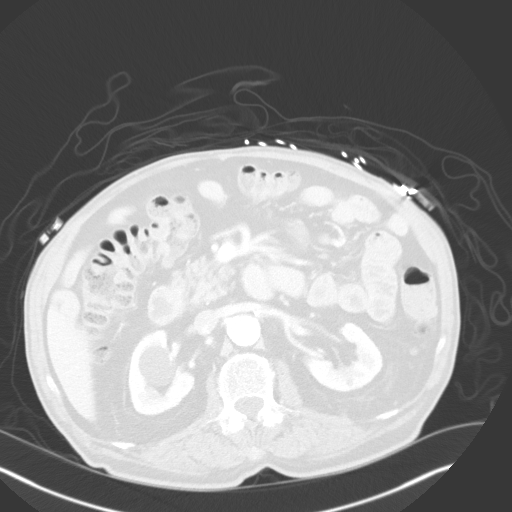

[Series 11: coronal art · coronal · 0.75mm/px · 2 of 145 slices shown, 3 images]
[im 49/145  soft-tissue]
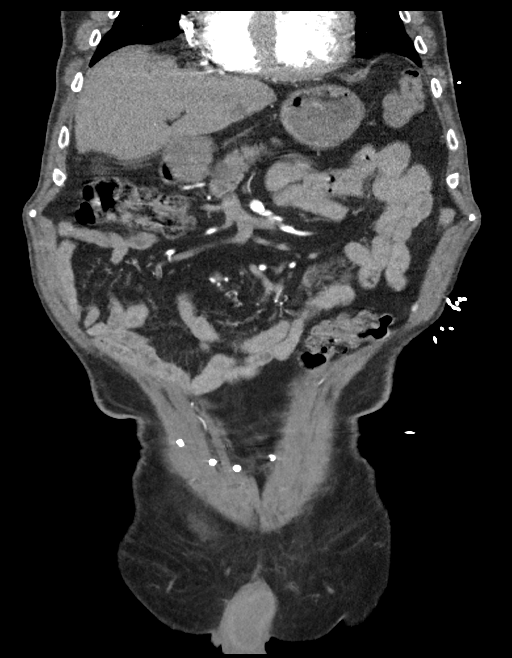
[im 49/145  bone]
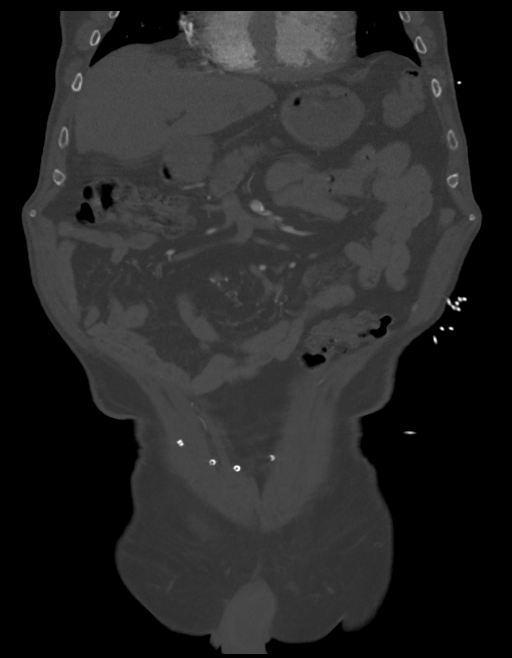
[im 97/145  soft-tissue]
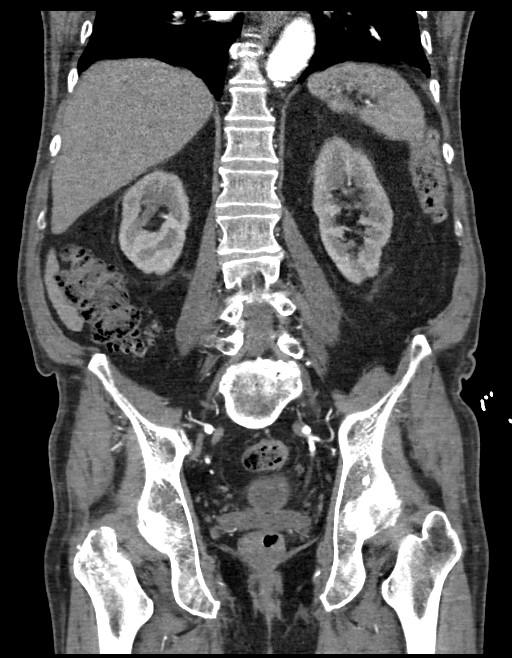

[11 of 46 positions shown; findings below may reference images not displayed]

FINDINGS: VASCULAR

Aorta: There is advanced atherosclerotic calcification. There is
chronic occlusion and narrowing of the distal aorta and prior
postsurgical changes. The aorta is patent. No aneurysmal dilatation
or dissection. No periaortic inflammatory changes or fluid
collection.

Celiac: There is a common origin of the celiac artery and SMA. There
is atherosclerotic calcification of the celiac artery. The celiac
artery and its major branches are patent. There is a replaced left
hepatic artery from the left gastric artery variant anatomy.

SMA: There is atherosclerotic calcification of the SMA. The SMA is
patent.

Renals: There is atherosclerotic calcification of the origins of the
renal arteries. The renal arteries remain patent.

IMA: The IMA is poorly visualized but appears patent.

Inflow: Atherosclerotic calcification of the iliac arteries. The
iliac arteries remain patent. No aneurysmal dilatation or
dissection.

Proximal Outflow: Bilateral common femoral and visualized portions
of the superficial and profunda femoral arteries are patent without
evidence of aneurysm, dissection, vasculitis or significant
stenosis.

Veins: No obvious venous abnormality within the limitations of this
arterial phase study.

Review of the MIP images confirms the above findings.

NON-VASCULAR

Lower chest: There is mild cardiomegaly with multi vessel coronary
vascular calcification and postsurgical changes of CABG. The
visualized lung bases are clear.

No intra-abdominal free air or free fluid.

Hepatobiliary: There is a 1 cm left hepatic cyst. Several additional
subcentimeter hypodense foci are too small to characterize. No
intrahepatic biliary ductal dilatation. Several stones noted in the
gallbladder. No pericholecystic fluid or evidence of acute
cholecystitis by CT.

Pancreas: Small cystic lesions adjacent to the head of the pancreas
measuring up to 1.8 cm (series 5, image 60). This are suboptimally
characterized but may represent side branch IPMN or mucinous
lesions. There is no dilatation of the main pancreatic duct or gland
atrophy. No active inflammatory changes. No peripancreatic fluid
collection.

Spleen: Normal in size without focal abnormality.

Adrenals/Urinary Tract: The adrenal glands are unremarkable. Several
nonobstructing bilateral renal calculi measure up to 10 mm in the
inferior pole of the left kidney. There is no hydronephrosis on
either side. Right renal inferior pole atrophy and scarring. There
is a 3 cm right renal inferior pole cyst as well as smaller
exophytic cyst. The visualized ureters appear unremarkable. There is
mild thickened and lobulated appearance of the bladder wall, likely
related to chronic bladder outlet obstruction. There is mild
perivesical haziness. Correlation with urinalysis recommended to
exclude cystitis.

Stomach/Bowel: There is diffuse colonic diverticulosis. There is
mild pericolonic haziness and inflammatory changes centered at a
diverticula in the distal ascending colon (series 11, image 65
consistent with acute diverticulitis. Faint contrast within the
lumen of the distal ascending colon (series 5 image 85) suspicious
for small diverticular bleed. There is no bowel obstruction. The
appendix is normal and extends into the right hemipelvis. The tip of
the appendix extends into a small right inguinal hernia.

Lymphatic: No adenopathy.

Reproductive: Enlarged prostate gland measuring approximately 6 cm
in transverse axial diameter. The seminal vesicles are symmetric.

Other: Prior hernia repair in the anterior pelvic wall. There is a
small residual right inguinal hernia.

Musculoskeletal: There is an acute nondisplaced fracture of the
greater trochanter of the right femur with probable extension of the
fracture into the intertrochanteric ridge. The bones are osteopenic.
No dislocation.
IMPRESSION: VASCULAR

No aortic aneurysm or dissection. Chronic occlusion of the distal
aorta with prior postsurgical changes.

NON-VASCULAR

1. Acute diverticulitis of the distal ascending colon with probable
associated small diverticular bleed.
2. Acute nondisplaced fracture of the greater trochanter of the
right femur with extension into the intertrochanteric ridge. No
dislocation.
3. Cholelithiasis.
4. Nonobstructing bilateral renal calculi. No hydronephrosis.
5. Enlarged prostate gland with findings suggestive of chronic
bladder outlet obstruction. Correlation with urinalysis recommended
to exclude cystitis.
6. Small cystic lesions adjacent to the head of the pancreas,
incompletely characterized, possibly side branch IPMN or mucinous
lesions. Further evaluation with nonemergent MRI recommended.

## 2019-06-24 MED ORDER — DONEPEZIL HCL 10 MG PO TABS
10.0000 mg | ORAL_TABLET | Freq: Every day | ORAL | Status: DC
  Administered 2019-06-25: 10 mg via ORAL
  Filled 2019-06-24: qty 1

## 2019-06-24 MED ORDER — ACETAMINOPHEN 650 MG RE SUPP: 650.0000 mg | Freq: Four times a day (QID) | RECTAL | Status: DC | PRN

## 2019-06-24 MED ORDER — IOHEXOL 350 MG/ML SOLN
100.0000 mL | Freq: Once | INTRAVENOUS | Status: AC | PRN
  Administered 2019-06-24: 100 mL via INTRAVENOUS

## 2019-06-24 MED ORDER — INSULIN ASPART 100 UNIT/ML ~~LOC~~ SOLN
0.0000 [IU] | SUBCUTANEOUS | Status: DC
  Administered 2019-06-25 – 2019-06-26 (×4): 1 [IU] via SUBCUTANEOUS
  Administered 2019-06-26: 5 [IU] via SUBCUTANEOUS
  Filled 2019-06-24: qty 0.09

## 2019-06-24 MED ORDER — SODIUM CHLORIDE 0.9 % IV BOLUS
1000.0000 mL | Freq: Once | INTRAVENOUS | Status: AC
  Administered 2019-06-24: 1000 mL via INTRAVENOUS

## 2019-06-24 MED ORDER — ATORVASTATIN CALCIUM 40 MG PO TABS
80.0000 mg | ORAL_TABLET | Freq: Every day | ORAL | Status: DC
  Administered 2019-06-25 – 2019-06-26 (×2): 80 mg via ORAL
  Filled 2019-06-24 (×3): qty 2

## 2019-06-24 MED ORDER — SODIUM CHLORIDE 0.9 % IV SOLN
2.0000 g | Freq: Once | INTRAVENOUS | Status: AC
  Administered 2019-06-24: 23:00:00 2 g via INTRAVENOUS
  Filled 2019-06-24: qty 20

## 2019-06-24 MED ORDER — LEVOTHYROXINE SODIUM 75 MCG PO TABS
75.0000 ug | ORAL_TABLET | Freq: Every day | ORAL | Status: DC
  Administered 2019-06-25 – 2019-06-26 (×2): 75 ug via ORAL
  Filled 2019-06-24 (×2): qty 1

## 2019-06-24 MED ORDER — SODIUM CHLORIDE 0.9 % IV SOLN: INTRAVENOUS | Status: AC

## 2019-06-24 MED ORDER — METRONIDAZOLE IN NACL 5-0.79 MG/ML-% IV SOLN
500.0000 mg | Freq: Once | INTRAVENOUS | Status: AC
  Administered 2019-06-24: 500 mg via INTRAVENOUS
  Filled 2019-06-24: qty 100

## 2019-06-24 MED ORDER — ESCITALOPRAM OXALATE 10 MG PO TABS
10.0000 mg | ORAL_TABLET | Freq: Every day | ORAL | Status: DC
  Administered 2019-06-25 – 2019-06-26 (×2): 10 mg via ORAL
  Filled 2019-06-24 (×2): qty 1

## 2019-06-24 MED ORDER — METRONIDAZOLE IN NACL 5-0.79 MG/ML-% IV SOLN
500.0000 mg | Freq: Three times a day (TID) | INTRAVENOUS | Status: DC
  Administered 2019-06-25: 500 mg via INTRAVENOUS
  Filled 2019-06-24: qty 100

## 2019-06-24 MED ORDER — ACETAMINOPHEN 325 MG PO TABS: 650.0000 mg | ORAL_TABLET | Freq: Four times a day (QID) | ORAL | Status: DC | PRN

## 2019-06-24 MED ORDER — SODIUM CHLORIDE (PF) 0.9 % IJ SOLN
INTRAMUSCULAR | Status: AC
  Filled 2019-06-24: qty 50

## 2019-06-24 MED ORDER — SODIUM CHLORIDE 0.9 % IV SOLN: 2.0000 g | INTRAVENOUS | Status: DC

## 2019-06-24 NOTE — ED Triage Notes (Signed)
Patient c/o bright red rectal bleeding with blood clots since 1630 today. Patient states he was hypotensive at the doctor's office this afternoon.

## 2019-06-24 NOTE — H&P (Signed)
History and Physical    [name] [id number] DOB: October 05, 1940 DOA: 06/24/2019  PCP: Eulas Post, MD Patient coming from: PCPs office  Chief Complaint: Rectal bleeding, hypotension  HPI: Miguel Vazquez is a 79 y.o. male with medical history significant of diverticulosis, CAD, chronic combined systolic and diastolic congestive heart failure, diabetes, hypertension, hyperlipidemia, arthritis presenting from his PCPs office for evaluation of rectal bleeding and hypotension.  History provided by patient and wife at bedside.  Since this afternoon he has had 3 episodes of large-volume bright red blood per rectum.  No abdominal pain or hematemesis.  He takes a baby aspirin daily and is not on any other blood thinners.  He has received his first Covid vaccine and is due for the second dose next week.  Denies fevers, cough, shortness of breath, or chest pain.  Does report feeling dizzy.  ED Course: Blood pressure soft, remainder of vital signs stable.  Labs showing no leukocytosis.  Hemoglobin 11.8, was above 14 two months ago.  FOBT positive.   CT angiogram abdomen/pelvis showing acute diverticulitis of the distal ascending colon with probable associated small diverticular bleed.  Acute nondisplaced fracture of the greater trochanter of the right femur with extension into the intertrochanteric ridge.  No dislocation.  Patient received ceftriaxone, metronidazole, and 1 L normal saline bolus.  Review of Systems:  All systems reviewed and apart from history of presenting illness, are negative.  Past Medical History:  Diagnosis Date  . Arthritis   . Brain aneurysm 2018   followed by Dr [name]  . Cataract   . Chronic kidney disease    kidney stone- 40 years ago  . Coronary artery disease   . Diabetes mellitus   . GERD (gastroesophageal reflux disease)   . Hx of abdominal aortic aneurysm 1998  . Hyperlipidemia   . Hypertension   . Myocardial infarction ([place])    2016  . Personal history  of colonic polyps - adenomas 09/09/2013    Past Surgical History:  Procedure Laterality Date  . ABDOMINAL AORTIC ANEURYSM REPAIR  1998  . CARDIAC CATHETERIZATION N/A 03/18/2015   Procedure: Left Heart Cath and Coronary Angiography;  Surgeon: Peter M Martinique, MD;  Location: [facility];  Service: Cardiovascular;  Laterality: N/A;  . cataract surgery Right   . COLONOSCOPY    . CORONARY ARTERY BYPASS GRAFT N/A 03/18/2015   Procedure: CORONARY ARTERY BYPASS GRAFTING (CABG) x  three, using left internal mammary artery and right leg greater saphenous vein harvested endoscopically;  Surgeon: Melrose Nakayama, MD;  Location: [place];  Service: Open Heart Surgery;  Laterality: N/A;  . INGUINAL HERNIA REPAIR Bilateral   . INTRAOPERATIVE TRANSESOPHAGEAL ECHOCARDIOGRAM N/A 03/18/2015   Procedure: INTRAOPERATIVE TRANSESOPHAGEAL ECHOCARDIOGRAM;  Surgeon: Melrose Nakayama, MD;  Location: [facility];  Service: Open Heart Surgery;  Laterality: N/A;  . open heart surgery  2016  . [place]     reports that he quit smoking about 32 years ago. His smoking use included cigarettes. He has a 30.00 pack-year smoking history. He has never used smokeless tobacco. He reports previous alcohol use. He reports that he does not use drugs.  Allergies  Allergen Reactions  . Food Anaphylaxis    TREE NUTS  . Shellfish Allergy Anaphylaxis  . Prednisone     "psychosis"  . Venomil Honey Bee Venom [Honey Bee Venom] Swelling    Localized swelling    Family History  Problem Relation Age of Onset  .  Bone cancer Mother   . Diabetes Paternal Aunt   . Stroke Son   . Down syndrome Son   . Colon cancer Neg Hx   . Esophageal cancer Neg Hx   . Rectal cancer Neg Hx   . Stomach cancer Neg Hx     Prior to Admission medications   Medication Sig Start Date End Date Taking? Authorizing Provider  aspirin EC 81 MG tablet Take 1 tablet (81 mg total) by mouth daily. 06/07/17  Yes Martinique, Peter M, MD    atorvastatin (LIPITOR) 80 MG tablet TAKE 1 TABLET (80 MG TOTAL) BY MOUTH DAILY AT 6 PM. 12/19/18  Yes [name], [name], MD  carvedilol (COREG) 6.25 MG tablet TAKE 1 TABLET BY MOUTH TWICE A DAY 08/11/18  Yes Martinique, Peter M, MD  donepezil (ARICEPT) 10 MG tablet Take 1 tablet (10 mg total) by mouth at bedtime. 06/15/19  Yes [name], [name], MD  EPINEPHrine 0.3 mg/0.3 mL IJ SOAJ injection Inject 0.3 mLs (0.3 mg total) into the muscle as needed for anaphylaxis. 10/30/18  Yes [name], [name], MD  escitalopram (LEXAPRO) 10 MG tablet Take 10 mg by mouth daily.   Yes [provider]  levothyroxine (SYNTHROID) 75 MCG tablet TAKE 1 TABLET BY MOUTH EVERY DAY 04/29/19  Yes [name], [name], MD  losartan (COZAAR) 100 MG tablet TAKE 1 TABLET BY MOUTH EVERY DAY 04/29/19  Yes [name], [name], MD  metFORMIN (GLUCOPHAGE) 1000 MG tablet TAKE 1 TABLET BY MOUTH TWICE A DAY 08/01/18  Yes [name], [name], MD  Multiple Vitamin (MULTIVITAMIN) tablet Take 1 tablet by mouth daily. Centrium Silver once daily    Yes [provider]  vitamin B-12 (CYANOCOBALAMIN) 1000 MCG tablet Take 1,000 mcg by mouth daily.   Yes [provider]  vitamin C (ASCORBIC ACID) 500 MG tablet Take 500 mg by mouth every evening.    Yes [provider]  glucose blood (FREESTYLE LITE) test strip Check Blood Sugars 1-2 times per day. DX: E11.9. 01/10/16   [name], [name], MD  Lancets (FREESTYLE) lancets Check Blood Sugars 1-2 times per day. DX: E11.9 01/10/16   Eulas Post, MD    Physical Exam: Vitals:   06/24/19 2215 06/24/19 2317 06/24/19 2330 06/25/19 0000  BP:  111/79 107/68 118/71  Pulse: 75 89 71 79  Resp:  (!) 22 15 16   Temp:      TempSrc:      SpO2: 95% 95% 96% 96%  Weight:      Height:        Physical Exam  Constitutional: He is oriented to person, place, and time. He appears well-developed and well-nourished. No distress.  HENT:  Head: Normocephalic.  Eyes: Right eye  exhibits no discharge. Left eye exhibits no discharge.  Cardiovascular: Normal rate, regular rhythm and intact distal pulses.  Pulmonary/Chest: Effort normal and breath sounds normal. No respiratory distress. He has no wheezes. He has no rales.  Abdominal: Soft. Bowel sounds are normal. There is no abdominal tenderness. There is no rebound and no guarding.  Slightly distended  Musculoskeletal:        General: No edema.     Cervical back: Neck supple.  Neurological: He is alert and oriented to person, place, and time.  Skin: Skin is warm and dry. He is not diaphoretic.     Labs on Admission: I have personally reviewed following labs and imaging studies  CBC: Recent Labs  Lab 06/24/19 1840  WBC 7.9  HGB 11.8*  HCT 35.2*  MCV 100.0  PLT [id number]   Basic Metabolic Panel: Recent Labs  Lab 06/24/19 1840  NA 139  K 4.3  CL 104  CO2 26  GLUCOSE 198*  BUN 23  CREATININE 1.07  CALCIUM 9.3   GFR: Estimated Creatinine Clearance: 52.3 mL/min (by C-G formula based on SCr of 1.07 mg/dL). Liver Function Tests: Recent Labs  Lab 06/24/19 1840  AST 22  ALT 23  ALKPHOS 67  BILITOT 0.5  PROT 6.6  ALBUMIN 3.6   No results for input(s): LIPASE, AMYLASE in the last 168 hours. No results for input(s): AMMONIA in the last 168 hours. Coagulation Profile: No results for input(s): INR, PROTIME in the last 168 hours. Cardiac Enzymes: No results for input(s): CKTOTAL, CKMB, CKMBINDEX, TROPONINI in the last 168 hours. BNP (last 3 results) No results for input(s): PROBNP in the last 8760 hours. HbA1C: No results for input(s): HGBA1C in the last 72 hours. CBG: No results for input(s): GLUCAP in the last 168 hours. Lipid Profile: No results for input(s): CHOL, HDL, LDLCALC, TRIG, CHOLHDL, LDLDIRECT in the last 72 hours. Thyroid Function Tests: No results for input(s): TSH, T4TOTAL, FREET4, T3FREE, THYROIDAB in the last 72 hours. Anemia Panel: No results for input(s): VITAMINB12, FOLATE,  FERRITIN, TIBC, IRON, RETICCTPCT in the last 72 hours. Urine analysis:    Component Value Date/Time   COLORURINE YELLOW 03/06/2017 1745   APPEARANCEUR CLEAR 03/06/2017 1745   LABSPEC 1.017 03/06/2017 1745   PHURINE 7.0 03/06/2017 1745   GLUCOSEU NEGATIVE 03/06/2017 1745   HGBUR NEGATIVE 03/06/2017 1745   BILIRUBINUR NEGATIVE 03/06/2017 1745   BILIRUBINUR n 02/08/2017 1451   KETONESUR NEGATIVE 03/06/2017 1745   PROTEINUR 30 (A) 03/06/2017 1745   UROBILINOGEN 0.2 02/08/2017 1451   NITRITE NEGATIVE 03/06/2017 1745   LEUKOCYTESUR NEGATIVE 03/06/2017 1745    Radiological Exams on Admission: CT Angio Abd/Pel W and/or Wo Contrast  Result Date: 06/24/2019 CLINICAL DATA:  79 year old male with GI bleed. Right red blood per rectum. EXAM: CTA ABDOMEN AND PELVIS WITHOUT AND WITH CONTRAST TECHNIQUE: Multidetector CT imaging of the abdomen and pelvis was performed using the standard protocol during bolus administration of intravenous contrast. Multiplanar reconstructed images and MIPs were obtained and reviewed to evaluate the vascular anatomy. CONTRAST:  165mL OMNIPAQUE IOHEXOL 350 MG/ML SOLN COMPARISON:  CT abdomen pelvis dated 02/04/2015. FINDINGS: VASCULAR Aorta: There is advanced atherosclerotic calcification. There is chronic occlusion and narrowing of the distal aorta and prior postsurgical changes. The aorta is patent. No aneurysmal dilatation or dissection. No periaortic inflammatory changes or fluid collection. Celiac: There is a common origin of the celiac artery and SMA. There is atherosclerotic calcification of the celiac artery. The celiac artery and its major branches are patent. There is a replaced left hepatic artery from the left gastric artery variant anatomy. SMA: There is atherosclerotic calcification of the SMA. The SMA is patent. Renals: There is atherosclerotic calcification of the origins of the renal arteries. The renal arteries remain patent. IMA: The IMA is poorly visualized but  appears patent. Inflow: Atherosclerotic calcification of the iliac arteries. The iliac arteries remain patent. No aneurysmal dilatation or dissection. Proximal Outflow: Bilateral common femoral and visualized portions of the superficial and profunda femoral arteries are patent without evidence of aneurysm, dissection, vasculitis or significant stenosis. Veins: No obvious venous abnormality within the limitations of this arterial phase study. Review of the MIP images confirms the above findings. NON-VASCULAR Lower chest: There is mild cardiomegaly with multi  vessel coronary vascular calcification and postsurgical changes of CABG. The visualized lung bases are clear. No intra-abdominal free air or free fluid. Hepatobiliary: There is a 1 cm left hepatic cyst. Several additional subcentimeter hypodense foci are too small to characterize. No intrahepatic biliary ductal dilatation. Several stones noted in the gallbladder. No pericholecystic fluid or evidence of acute cholecystitis by CT. Pancreas: Small cystic lesions adjacent to the head of the pancreas measuring up to 1.8 cm (series 5, image 60). This are suboptimally characterized but may represent side branch IPMN or mucinous lesions. There is no dilatation of the main pancreatic duct or gland atrophy. No active inflammatory changes. No peripancreatic fluid collection. Spleen: Normal in size without focal abnormality. Adrenals/Urinary Tract: The adrenal glands are unremarkable. Several nonobstructing bilateral renal calculi measure up to 10 mm in the inferior pole of the left kidney. There is no hydronephrosis on either side. Right renal inferior pole atrophy and scarring. There is a 3 cm right renal inferior pole cyst as well as smaller exophytic cyst. The visualized ureters appear unremarkable. There is mild thickened and lobulated appearance of the bladder wall, likely related to chronic bladder outlet obstruction. There is mild perivesical haziness. Correlation  with urinalysis recommended to exclude cystitis. Stomach/Bowel: There is diffuse colonic diverticulosis. There is mild pericolonic haziness and inflammatory changes centered at a diverticula in the distal ascending colon (series 11, image 65 consistent with acute diverticulitis. Faint contrast within the lumen of the distal ascending colon (series 5 image 85) suspicious for small diverticular bleed. There is no bowel obstruction. The appendix is normal and extends into the right hemipelvis. The tip of the appendix extends into a small right inguinal hernia. Lymphatic: No adenopathy. Reproductive: Enlarged prostate gland measuring approximately 6 cm in transverse axial diameter. The seminal vesicles are symmetric. Other: Prior hernia repair in the anterior pelvic wall. There is a small residual right inguinal hernia. Musculoskeletal: There is an acute nondisplaced fracture of the greater trochanter of the right femur with probable extension of the fracture into the intertrochanteric ridge. The bones are osteopenic. No dislocation. IMPRESSION: VASCULAR No aortic aneurysm or dissection. 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 NON-VASCULAR 1. Acute diverticulitis of the distal ascending colon with probable associated small diverticular bleed. 2. Acute nondisplaced fracture of the greater trochanter of the right femur with extension into the intertrochanteric ridge. No dislocation. 3. Cholelithiasis. 4. Nonobstructing bilateral renal calculi. No hydronephrosis. 5. 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 Correlation with urinalysis recommended to exclude cystitis. 6. Small cystic lesions adjacent to the head of the pancreas, incompletely characterized, possibly side branch IPMN or mucinous lesions. Further evaluation with nonemergent MRI recommended. Electronically Signed   By: Anner Crete M.D.   On: 06/24/2019 21:52     Assessment/Plan Principal Problem:   GI bleed Active Problems:   Essential hypertension   Diverticulitis   Acute blood loss anemia   Femur fracture (HCC)   Acute diverticulitis with diverticular bleed, acute blood loss anemia: Hemoglobin 11.8, was above 14 two months ago.  FOBT positive.  Currently hemodynamically stable. CT angiogram abdomen/pelvis showing acute diverticulitis of the distal ascending colon with probable associated small diverticular bleed.  Plan is to admit to the stepdown unit and monitor hemodynamics closely.  Continue IV fluid hydration and antibiotics.  Monitor H&H every 4 hours.  Give PRBCs if Hgb <10.  Consult GI in a.m. If patient has further episodes of bleeding/signs of hemodynamic instability, GI  will be consulted tonight  Right femur fracture: CT showing acute nondisplaced fracture of the greater trochanter of the right femur with extension into the intertrochanteric ridge.  No dislocation.  Wife states that the patient fell about 6 to 8 weeks ago and was told he had a fracture at that time.  He was referred to orthopedics but never heard from their office.  Patient has not had any pain in this hip and has had no difficulty ambulating.  No recent falls.  Wife states they did subsequently follow-up with his PCP and it was felt that this could be managed nonoperatively.  Please consult orthopedics in the morning.  Will order PT and OT evaluation.  Abnormal pancreatic findings on CT: CT showing small cystic lesions adjacent to the head of the pancreas, incompletely characterized, possibly sidebranch IPMN or mucinous lesions.  Further evaluation with nonemergent MRI recommended.  Non-insulin-dependent diabetes: Check A1c.  Sliding scale insulin sensitive every 4 hours as patient is currently n.p.o.  Chronic combined systolic and diastolic congestive heart failure: Stable.  No signs of volume overload at this time.  Hypertension: Hold antihypertensives at this  time  Hypothyroidism: Continue Synthroid  DVT prophylaxis: SCDs Code Status: Full code.  Discussed with the patient. Family Communication: Wife at bedside. Disposition Plan: Anticipate discharge after treatment of acute diverticulitis/resolution of diverticular bleed. Admission status: It is my clinical opinion that admission to INPATIENT is reasonable and necessary because of the expectation that this patient will require hospital care that crosses at least 2 midnights to treat this condition based on the medical complexity of the problems presented.  Given the aforementioned information, the predictability of an adverse outcome is felt to be significant.  The medical decision making on this patient was of high complexity and the patient is at high risk for clinical deterioration, therefore this is a level 3 visit.  Shela Leff MD [facility]  If 7PM-7AM, please contact night-coverage [email or web address]  06/25/2019, 12:38 AM

## 2019-06-24 NOTE — ED Provider Notes (Signed)
[facility] DEPT Provider Note   CSN: [id number] Arrival date & time: 06/24/19  1821     History Chief Complaint  Patient presents with  . Rectal Bleeding  . Hypotension    Miguel Vazquez is a 79 y.o. male.  The history is provided by the patient and medical records. No language interpreter was used.  Rectal Bleeding    79 year old male with history of diabetes, GERD, AAA, MI, brain aneurysm presents ED for evaluation of rectal bleeding.  Patient report today after having a bowel movement he noticed bright red blood per rectum in the toilet bowl.  He denies any significant pain with the advance no rectal pain no lightheadedness or dizziness.  He did not complain of any fever chills chest pain or shortness of breath nauseous vomiting or diarrhea.  He denies any recent injury.  He was seen by his PCP but was noted that his blood pressure was soft and therefore patient was recommended to come to ER for further evaluation.  Patient denies eating any food that may change his stool color.  He report having a colonoscopy last year that was normal.  He is not on any blood thinner medication.  He denies having this problem before.  Past Medical History:  Diagnosis Date  . Arthritis   . Brain aneurysm 2018   followed by Dr [name]  . Cataract   . Chronic kidney disease    kidney stone- 40 years ago  . Coronary artery disease   . Diabetes mellitus   . GERD (gastroesophageal reflux disease)   . Hx of abdominal aortic aneurysm 1998  . Hyperlipidemia   . Hypertension   . Myocardial infarction ([place])    2016  . Personal history of colonic polyps - adenomas 09/09/2013    Patient Active Problem List   Diagnosis Date Noted  . Depression, major, single episode, moderate ([place]) 04/15/2019  . Episode of confusion 03/14/2017  . Memory loss 03/14/2017  . Hypothyroidism 02/20/2017  . Pressure injury of skin 09/03/2016  . Angioedema 09/02/2016  . Cerebral atherosclerosis  01/18/2016  . 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  . Headache 01/06/2016  . Coronary artery disease involving coronary bypass graft of native heart without angina pectoris 12/22/2015  . 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 Cardiomyopathy, 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  . Scrotal inguinal hernia s/p lap repair w mesh 03/15/2015 03/21/2015  . STEMI 03/18/15 03/18/2015  . 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 Obesity (BMI 30-39.9) 11/18/2012  . History of AAA (abdominal aortic aneurysm) repair 11/18/2012  . BACK PAIN, UPPER 08/10/2009  . Type 2 diabetes mellitus with diabetic nephropathy, without long-term current use of insulin ([place]) 10/15/2008  . Hyperlipidemia 10/15/2008  . Essential hypertension 10/15/2008    Past Surgical History:  Procedure Laterality Date  . ABDOMINAL AORTIC ANEURYSM REPAIR  1998  . CARDIAC CATHETERIZATION N/A 03/18/2015   Procedure: Left Heart Cath and Coronary Angiography;  Surgeon: Peter M Martinique, MD;  Location: [facility];  Service: Cardiovascular;  Laterality: N/A;  . cataract surgery Right   . COLONOSCOPY    . CORONARY ARTERY BYPASS GRAFT N/A 03/18/2015   Procedure: CORONARY ARTERY BYPASS GRAFTING (CABG) x  three, using left internal mammary artery and right leg greater saphenous vein harvested endoscopically;  Surgeon: Melrose Nakayama, MD;  Location: [facility];  Service: Open Heart Surgery;  Laterality: N/A;  . INGUINAL HERNIA REPAIR Bilateral   . INTRAOPERATIVE TRANSESOPHAGEAL ECHOCARDIOGRAM N/A 03/18/2015   Procedure: INTRAOPERATIVE  TRANSESOPHAGEAL ECHOCARDIOGRAM;  Surgeon: Melrose Nakayama, MD;  Location: [facility];  Service: Open Heart Surgery;  Laterality: N/A;  . open heart surgery  2016  . UMBILICAL HERNIA REPAIR  1965       Family History  Problem Relation Age of Onset  . Bone cancer Mother   . Diabetes Paternal Aunt   . Stroke Son   . Down syndrome Son   . Colon cancer Neg Hx   . Esophageal cancer Neg Hx   . Rectal cancer Neg Hx   . Stomach  cancer Neg Hx     Social History   Tobacco Use  . Smoking status: Former Smoker    Packs/day: 1.50    Years: 20.00    Pack years: 30.00    Types: Cigarettes    Quit date: 08/24/1986    Years since quitting: 32.8  . Smokeless tobacco: Never Used  Substance Use Topics  . Alcohol use: Not Currently    Comment: occ  . Drug use: No    Home Medications Prior to Admission medications   Medication Sig Start Date End Date Taking? Authorizing Provider  aspirin EC 81 MG tablet Take 1 tablet (81 mg total) by mouth daily. 06/07/17   Martinique, Peter M, MD  atorvastatin (LIPITOR) 80 MG tablet TAKE 1 TABLET (80 MG TOTAL) BY MOUTH DAILY AT 6 PM. 12/19/18   [name], [name], MD  carvedilol (COREG) 6.25 MG tablet TAKE 1 TABLET BY MOUTH TWICE A DAY 08/11/18   Martinique, Peter M, MD  donepezil (ARICEPT) 10 MG tablet Take 1 tablet (10 mg total) by mouth at bedtime. 06/15/19   [name], [name], MD  EPINEPHrine 0.3 mg/0.3 mL IJ SOAJ injection Inject 0.3 mLs (0.3 mg total) into the muscle as needed for anaphylaxis. 10/30/18   [name], [name], MD  glucose blood (FREESTYLE LITE) test strip Check Blood Sugars 1-2 times per day. DX: E11.9. 01/10/16   [name], [name], MD  Lancets (FREESTYLE) lancets Check Blood Sugars 1-2 times per day. DX: E11.9 01/10/16   [name], [name], MD  levothyroxine (SYNTHROID) 75 MCG tablet TAKE 1 TABLET BY MOUTH EVERY DAY 04/29/19   [name], [name], MD  losartan (COZAAR) 100 MG tablet TAKE 1 TABLET BY MOUTH EVERY DAY 04/29/19   [name], [name], MD  metFORMIN (GLUCOPHAGE) 1000 MG tablet TAKE 1 TABLET BY MOUTH TWICE A DAY 08/01/18   [name], [name], MD  Multiple Vitamin (MULTIVITAMIN) tablet Take 1 tablet by mouth daily. Centrium Silver once daily     [provider]  vitamin B-12 (CYANOCOBALAMIN) 1000 MCG tablet Take 1,000 mcg by mouth daily.    [provider]  vitamin C (ASCORBIC ACID) 500 MG tablet Take 500 mg by mouth daily.    [provider]     Allergies    Food, Shellfish allergy, Prednisone, and Venomil honey bee venom [honey bee venom]  Review of Systems   Review of Systems  Gastrointestinal: Positive for hematochezia.  All other systems reviewed and are negative.   Physical Exam Updated Vital Signs BP (!) 92/54 (BP Location: Left Arm)   Pulse 79   Temp 97.8 F (36.6 C) (Oral)   Resp 18   Ht 5\' 8"  (1.727 m)   Wt 64 kg   SpO2 98%   BMI 21.44 kg/m   Physical Exam Vitals and nursing note reviewed.  Constitutional:      General: He is not in acute distress.    Appearance: He  is well-developed.  HENT:     Head: Atraumatic.  Eyes:     Conjunctiva/sclera: Conjunctivae normal.  Cardiovascular:     Rate and Rhythm: Normal rate and regular rhythm.     Pulses: Normal pulses.     Heart sounds: Normal heart sounds.  Pulmonary:     Effort: Pulmonary effort is normal.     Breath sounds: Normal breath sounds. No wheezing or rales.  Abdominal:     General: Bowel sounds are normal.     Palpations: Abdomen is soft.     Tenderness: There is no abdominal tenderness.  Genitourinary:    Comments: Chaperone present during exam.  Normal rectal tone, no obvious mass, BRBPR on glove. Musculoskeletal:     Cervical back: Neck supple.  Skin:    Findings: No rash.  Neurological:     Mental Status: He is alert and oriented to person, place, and time.  Psychiatric:        Mood and Affect: Mood normal.     ED Results / Procedures / Treatments   Labs (all labs ordered are listed, but only abnormal results are displayed) Labs Reviewed  COMPREHENSIVE METABOLIC PANEL - Abnormal; Notable for the following components:      Result Value   Glucose, Bld 198 (*)    All other components within normal limits  CBC - Abnormal; Notable for the following components:   RBC 3.52 (*)    Hemoglobin 11.8 (*)    HCT 35.2 (*)    All other components within normal limits  OCCULT BLOOD X 1 CARD TO LAB, STOOL - Abnormal; Notable for the  following components:   Fecal Occult Bld POSITIVE (*)    All other components within normal limits  SARS CORONAVIRUS 2 (TAT 6-24 HRS)  POC OCCULT BLOOD, ED  TYPE AND SCREEN  ABO/RH    EKG None  Radiology CT Angio Abd/Pel W and/or Wo Contrast  Result Date: 06/24/2019 CLINICAL DATA:  79 year old male with GI bleed. Right red blood per rectum. EXAM: CTA ABDOMEN AND PELVIS WITHOUT AND WITH CONTRAST TECHNIQUE: Multidetector CT imaging of the abdomen and pelvis was performed using the standard protocol during bolus administration of intravenous contrast. Multiplanar reconstructed images and MIPs were obtained and reviewed to evaluate the vascular anatomy. CONTRAST:  118mL OMNIPAQUE IOHEXOL 350 MG/ML SOLN COMPARISON:  CT abdomen pelvis dated 02/04/2015. FINDINGS: VASCULAR Aorta: There is advanced atherosclerotic calcification. There is chronic occlusion and narrowing of the distal aorta and prior postsurgical changes. The aorta is patent. No aneurysmal dilatation or dissection. No periaortic inflammatory changes or fluid collection. Celiac: There is a common origin of the celiac artery and SMA. There is atherosclerotic calcification of the celiac artery. The celiac artery and its major branches are patent. There is a replaced left hepatic artery from the left gastric artery variant anatomy. SMA: There is atherosclerotic calcification of the SMA. The SMA is patent. Renals: There is atherosclerotic calcification of the origins of the renal arteries. The renal arteries remain patent. IMA: The IMA is poorly visualized but appears patent. Inflow: Atherosclerotic calcification of the iliac arteries. The iliac arteries remain patent. No aneurysmal dilatation or dissection. Proximal Outflow: Bilateral common femoral and visualized portions of the superficial and profunda femoral arteries are patent without evidence of aneurysm, dissection, vasculitis or significant stenosis. Veins: No obvious venous abnormality  within the limitations of this arterial phase study. Review of the MIP images confirms the above findings. NON-VASCULAR Lower chest: There is mild cardiomegaly with multi  vessel coronary vascular calcification and postsurgical changes of CABG. The visualized lung bases are clear. No intra-abdominal free air or free fluid. Hepatobiliary: There is a 1 cm left hepatic cyst. Several additional subcentimeter hypodense foci are too small to characterize. No intrahepatic biliary ductal dilatation. Several stones noted in the gallbladder. No pericholecystic fluid or evidence of acute cholecystitis by CT. Pancreas: Small cystic lesions adjacent to the head of the pancreas measuring up to 1.8 cm (series 5, image 60). This are suboptimally characterized but may represent side branch IPMN or mucinous lesions. There is no dilatation of the main pancreatic duct or gland atrophy. No active inflammatory changes. No peripancreatic fluid collection. Spleen: Normal in size without focal abnormality. Adrenals/Urinary Tract: The adrenal glands are unremarkable. Several nonobstructing bilateral renal calculi measure up to 10 mm in the inferior pole of the left kidney. There is no hydronephrosis on either side. Right renal inferior pole atrophy and scarring. There is a 3 cm right renal inferior pole cyst as well as smaller exophytic cyst. The visualized ureters appear unremarkable. There is mild thickened and lobulated appearance of the bladder wall, likely related to chronic bladder outlet obstruction. There is mild perivesical haziness. Correlation with urinalysis recommended to exclude cystitis. Stomach/Bowel: There is diffuse colonic diverticulosis. There is mild pericolonic haziness and inflammatory changes centered at a diverticula in the distal ascending colon (series 11, image 65 consistent with acute diverticulitis. Faint contrast within the lumen of the distal ascending colon (series 5 image 85) suspicious for small diverticular  bleed. There is no bowel obstruction. The appendix is normal and extends into the right hemipelvis. The tip of the appendix extends into a small right inguinal hernia. Lymphatic: No adenopathy. Reproductive: Enlarged prostate gland measuring approximately 6 cm in transverse axial diameter. The seminal vesicles are symmetric. Other: Prior hernia repair in the anterior pelvic wall. There is a small residual right inguinal hernia. Musculoskeletal: There is an acute nondisplaced fracture of the greater trochanter of the right femur with probable extension of the fracture into the intertrochanteric ridge. The bones are osteopenic. No dislocation. IMPRESSION: VASCULAR No aortic aneurysm or dissection. 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 NON-VASCULAR 1. Acute diverticulitis of the distal ascending colon with probable associated small diverticular bleed. 2. Acute nondisplaced fracture of the greater trochanter of the right femur with extension into the intertrochanteric ridge. No dislocation. 3. Cholelithiasis. 4. Nonobstructing bilateral renal calculi. No hydronephrosis. 5. 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 Correlation with urinalysis recommended to exclude cystitis. 6. Small cystic lesions adjacent to the head of the pancreas, incompletely characterized, possibly side branch IPMN or mucinous lesions. Further evaluation with nonemergent MRI recommended. Electronically Signed   By: Anner Crete M.D.   On: 06/24/2019 21:52    Procedures Procedures (including critical care time)  Medications Ordered in ED Medications  sodium chloride (PF) 0.9 % injection (has no administration in time range)  cefTRIAXone (ROCEPHIN) 2 g in sodium chloride 0.9 % 100 mL IVPB (has no administration in time range)    And  metroNIDAZOLE (FLAGYL) IVPB 500 mg (has no administration in time range)  sodium chloride 0.9 % bolus 1,000 mL (1,000 mLs  Intravenous New Bag/Given 06/24/19 2117)  iohexol (OMNIPAQUE) 350 MG/ML injection 100 mL (100 mLs Intravenous Contrast Given 06/24/19 2042)    ED Course  I have reviewed the triage vital signs and the nursing notes.  Pertinent labs & imaging results that were available during my  care of the patient were reviewed by me and considered in my medical decision making (see chart for details).  Clinical Course as of Jun 24 2218  Wed Jun 24, 7030  3330 79 year old male here after 2 episodes of bright red blood per rectum today.  He said there was a little bit of lower abdominal discomfort.  No fevers chills.  No prior history of same.  Abdomen is soft.  Pressures soft here and dropped somewhat with orthostatics.  Getting lab work and CT.   [MB]    Clinical Course User Index [MB] Hayden Rasmussen, MD   MDM Rules/Calculators/A&P                      BP 116/68   Pulse 75   Temp 97.8 F (36.6 C) (Oral)   Resp 18   Ht 5\' 8"  (1.727 m)   Wt 64 kg   SpO2 95%   BMI 21.44 kg/m   Final Clinical Impression(s) / ED Diagnoses Final diagnoses:  Diverticulitis  History of GI diverticular bleed  Fracture, intertrochanteric, right femur, closed, initial encounter ([place])  Orthostatic hypotension    Rx / DC Orders ED Discharge Orders    None     7:17 PM Pt here with BRBPR today.  Initial BP soft.  abd soft and nontender.  History of diverticulosis on prior colonoscopy.  Will obtain abdominal pelvic CT scan to assess for his GI bleed.  Suspect diverticular bleed.  Care discussed with Dr. Melina Copa.  10:16 PM Blood pressure improved with IV fluid.  Positive fecal occult blood test.  He does have bright red blood per rectum.  Hemoglobin is 11.8.  This is a drop from his prior value.  Labs otherwise reassuring.  Abdominal pelvis CT scan obtained demonstrate acute diverticulitis of the distal ascending colon with probable associated small diverticular bleed.  This finding is consistent with patient  presenting complaint.  Will initiate antibiotic including Rocephin and Flagyl.  Incidentally an acute nondisplaced fracture of the greater trochanter of the right femur with extension to the intertrochanteric ridge.  No dislocation.  I did reexamine patient and he does not exhibit any discomfort or pain to his right hip or femur.  He mention he did fell 6 to 8 weeks ago and was told that he has a break in the bone but was recommended nonoperative treatment.  At this time due to evidence of diverticulitis, 3 large episodes of bright red blood per rectum, and a soft blood pressure with positive orthostatic vital sign, plan to patient for observation and antibiotic treatment.  10:56 PM Appreciate consultation from [place] DR. Rathore who agrees to see and admit pt for further care.      Domenic Moras, PA-C 06/24/19 2256    Hayden Rasmussen, MD 06/25/19 1001

## 2019-06-24 NOTE — Progress Notes (Signed)
Subjective:     Patient ID: Miguel Vazquez, male   DOB: Jan 27, 1941, 79 y.o.   MRN: [id number]  HPI   Miguel Vazquez has multiple chronic problems including history of CAD, hypertension, hypothyroidism, type 2 diabetes, history of abdominal aortic aneurysm repair.  He was seen as a late day work-in initially with concerns that he had hematuria.  However on further questioning he has not had any hematuria but has had some bright red blood per rectum.  They first noted this around 4:30 today and has had a total of 3 liquidy/ bloody stools since then.  No abdominal pain.  He has had some very mild nausea but no vomiting.  No epigastric pain.  He had colonoscopy October 2019 and had diffuse diverticulosis throughout the colon.  No history of diverticulosis bleed.  He took his usual medications including aspirin earlier today. He has developed some lightheadedness now.  He has mild nausea at this time but no vomiting.  He has some cognitive deficits at baseline.    Past Medical History:  Diagnosis Date  . Arthritis   . Brain aneurysm 2018   followed by Dr [name]  . Cataract   . Chronic kidney disease    kidney stone- 40 years ago  . Coronary artery disease   . Diabetes mellitus   . GERD (gastroesophageal reflux disease)   . Hx of abdominal aortic aneurysm 1998  . Hyperlipidemia   . Hypertension   . Myocardial infarction ([place])    2016  . Personal history of colonic polyps - adenomas 09/09/2013   Past Surgical History:  Procedure Laterality Date  . ABDOMINAL AORTIC ANEURYSM REPAIR  1998  . CARDIAC CATHETERIZATION N/A 03/18/2015   Procedure: Left Heart Cath and Coronary Angiography;  Surgeon: Peter M Martinique, MD;  Location: [facility];  Service: Cardiovascular;  Laterality: N/A;  . cataract surgery Right   . COLONOSCOPY    . CORONARY ARTERY BYPASS GRAFT N/A 03/18/2015   Procedure: CORONARY ARTERY BYPASS GRAFTING (CABG) x  three, using left internal mammary artery and right leg greater  saphenous vein harvested endoscopically;  Surgeon: Melrose Nakayama, MD;  Location: [facility];  Service: Open Heart Surgery;  Laterality: N/A;  . INGUINAL HERNIA REPAIR Bilateral   . INTRAOPERATIVE TRANSESOPHAGEAL ECHOCARDIOGRAM N/A 03/18/2015   Procedure: INTRAOPERATIVE TRANSESOPHAGEAL ECHOCARDIOGRAM;  Surgeon: Melrose Nakayama, MD;  Location: [facility];  Service: Open Heart Surgery;  Laterality: N/A;  . open heart surgery  2016  . [place]    reports that he quit smoking about 32 years ago. His smoking use included cigarettes. He has a 30.00 pack-year smoking history. He has never used smokeless tobacco. He reports current alcohol use. He reports that he does not use drugs. family history includes Bone cancer in his mother; Diabetes in his paternal aunt; Down syndrome in his son; Stroke in his son. Allergies  Allergen Reactions  . Food Anaphylaxis    TREE NUTS  . Shellfish Allergy Anaphylaxis  . Prednisone     "psychosis"  . Venomil Honey Bee Venom [Honey Bee Venom] Swelling    Localized swelling     Review of Systems  Constitutional: Positive for fatigue. Negative for chills and fever.  Respiratory: Negative for cough.   Cardiovascular: Negative for chest pain.  Gastrointestinal: Positive for blood in stool, diarrhea and nausea. Negative for abdominal distention, abdominal pain and vomiting.  Genitourinary: Negative for dysuria and hematuria.  Neurological: Positive for dizziness. Negative for  syncope.       Objective:   Physical Exam Vitals reviewed.  Constitutional:      Comments: Patient is slightly pale in appearance  Cardiovascular:     Comments: Mildly tachycardic with heart rate around 100 Pulmonary:     Effort: Pulmonary effort is normal.     Breath sounds: Normal breath sounds.  Abdominal:     General: Bowel sounds are normal. There is no distension.     Palpations: Abdomen is soft.     Tenderness: There is no abdominal tenderness. There is  no guarding or rebound.  Genitourinary:    Comments: We planned to do rectal exam but he became very weak we decided at that point he needed to be transported to ER for further evaluation Musculoskeletal:     Right lower leg: No edema.     Left lower leg: No edema.  Skin:    Comments: Conjunctive are pale.  Nailbeds are also slightly pale        Assessment:     Patient presents with acute onset of bright red blood per rectum with watery bloody stools this afternoon around 4:30 with a total of 3 bloody stools since onset.  He has mild tachycardia and low blood pressure and clinically is pale in appearance.   Had difficulty auscultating standing blood pressure ?  Acute diverticulosis bleed.  Cannot rule out ischemic colitis though he really does not have any significant pain at this time which makes less likely    Plan:     -Recommend ER for further evaluation and management. -We discussed possible EMS transport but patient declines.  Wife states that he was stable getting here and she feels comfortable transporting  Eulas Post MD [facility]

## 2019-06-25 ENCOUNTER — Telehealth: Payer: Self-pay | Admitting: Family Medicine

## 2019-06-25 ENCOUNTER — Telehealth: Payer: PPO

## 2019-06-25 DIAGNOSIS — S7290XA Unspecified fracture of unspecified femur, initial encounter for closed fracture: Secondary | ICD-10-CM

## 2019-06-25 DIAGNOSIS — K922 Gastrointestinal hemorrhage, unspecified: Secondary | ICD-10-CM | POA: Diagnosis present

## 2019-06-25 DIAGNOSIS — D62 Acute posthemorrhagic anemia: Secondary | ICD-10-CM

## 2019-06-25 DIAGNOSIS — R935 Abnormal findings on diagnostic imaging of other abdominal regions, including retroperitoneum: Secondary | ICD-10-CM

## 2019-06-25 DIAGNOSIS — K5792 Diverticulitis of intestine, part unspecified, without perforation or abscess without bleeding: Secondary | ICD-10-CM

## 2019-06-25 DIAGNOSIS — K5731 Diverticulosis of large intestine without perforation or abscess with bleeding: Secondary | ICD-10-CM

## 2019-06-25 LAB — HEMOGLOBIN AND HEMATOCRIT, BLOOD
HCT: 26.8 % — ABNORMAL LOW (ref 39.0–52.0)
HCT: 30.3 % — ABNORMAL LOW (ref 39.0–52.0)
HCT: 31.6 % — ABNORMAL LOW (ref 39.0–52.0)
Hemoglobin: 8.9 g/dL — ABNORMAL LOW (ref 13.0–17.0)
Hemoglobin: 9.9 g/dL — ABNORMAL LOW (ref 13.0–17.0)
Hemoglobin: 10.7 g/dL — ABNORMAL LOW (ref 13.0–17.0)

## 2019-06-25 LAB — HEMOGLOBIN A1C
Hgb A1c MFr Bld: 6.3 % — ABNORMAL HIGH (ref 4.8–5.6)
Mean Plasma Glucose: 134.11 mg/dL

## 2019-06-25 LAB — SARS CORONAVIRUS 2 (TAT 6-24 HRS): SARS Coronavirus 2: NEGATIVE

## 2019-06-25 LAB — GLUCOSE, CAPILLARY
Glucose-Capillary: 145 mg/dL — ABNORMAL HIGH (ref 70–99)
Glucose-Capillary: 150 mg/dL — ABNORMAL HIGH (ref 70–99)
Glucose-Capillary: 98 mg/dL (ref 70–99)

## 2019-06-25 LAB — CBG MONITORING, ED
Glucose-Capillary: 107 mg/dL — ABNORMAL HIGH (ref 70–99)
Glucose-Capillary: 110 mg/dL — ABNORMAL HIGH (ref 70–99)
Glucose-Capillary: 126 mg/dL — ABNORMAL HIGH (ref 70–99)

## 2019-06-25 LAB — PREPARE RBC (CROSSMATCH)

## 2019-06-25 LAB — ABO/RH: ABO/RH(D): AB POS

## 2019-06-25 MED ORDER — SODIUM CHLORIDE 0.9% IV SOLUTION: Freq: Once | INTRAVENOUS | Status: DC

## 2019-06-25 NOTE — Telephone Encounter (Signed)
Pt's wife, [name], is calling to let Dr. Elease Hashimoto, know that pt is in the hospital. Pt was admitted to the hospital on 06/24/19 afternoon. Thanks

## 2019-06-25 NOTE — Progress Notes (Signed)
79 year old male admitted with possible diverticular bleed Baseline hemoglobin 14.2 admitted with a hemoglobin of 11.8 down to 8.9.  Has had no bleeding since arrival to the hospital.  Appreciate GI input.  Continue to check H&H and transfuse to keep hemoglobin above 8.  Clear liquid diet.  Monitor overnight for any further bleeding.  If he does any further bleed consult IR for possible embolization.

## 2019-06-25 NOTE — ED Notes (Signed)
BLOOD SUGAR TAKEN PATIENT MADE COMFORTABLE HE WAS GIVE CALL BELL

## 2019-06-25 NOTE — Progress Notes (Signed)
ED TO INPATIENT HANDOFF REPORT  Name/Age/Gender Miguel Vazquez 79 y.o. male  Code Status    Code Status Orders  (From admission, onward)         Start     Ordered   06/24/19 2317  Full code  Continuous     06/24/19 2318        Code Status History    Date Active Date Inactive Code Status Order ID Comments User Context   09/02/2016 2027 09/04/2016 1557 Full Code [id number]  Sharia Reeve, MD Inpatient   01/06/2016 1406 01/07/2016 1744 Full Code [id number]  Elwin Mocha, MD Inpatient   03/18/2015 2219 03/26/2015 1521 Full Code [id number]  Barrett, Lodema Hong, PA-C Inpatient   Advance Care Planning Activity    Advance Directive Documentation     Most Recent Value  Type of Advance Directive  Healthcare Power of [place], Living will  Pre-existing out of facility DNR order (yellow form or pink MOST form)  --  "MOST" Form in Place?  --      Home/SNF/Other Home  Chief Complaint Lower GI bleed [K92.2]  Level of Care/Admitting Diagnosis ED Disposition    ED Disposition Condition [place]: [place] [[id number]]  Level of Care: Telemetry [5]  Admit to tele based on following criteria: Other see comments  Comments: GI bleed  May admit patient to [facility] or [facility] if equivalent level of care is available:: Yes  Covid Evaluation: Confirmed COVID Negative  Date Laboratory Confirmed COVID Negative: 06/25/2019  Diagnosis: Lower GI bleed [id number]  Admitting Physician: Shela Leff [id number]  Attending Physician: Shela Leff [id number]  Estimated length of stay: past midnight tomorrow  Certification:: I certify this patient will need inpatient services for at least 2 midnights       Medical History Past Medical History:  Diagnosis Date  . Arthritis   . Brain aneurysm 2018   followed by Dr [name]  . Cataract   . Chronic kidney disease    kidney stone- 40 years ago  . Coronary artery disease   . Diabetes mellitus   . GERD  (gastroesophageal reflux disease)   . Hx of abdominal aortic aneurysm 1998  . Hyperlipidemia   . Hypertension   . Myocardial infarction ([place])    2016  . Personal history of colonic polyps - adenomas 09/09/2013    Allergies Allergies  Allergen Reactions  . Food Anaphylaxis    TREE NUTS  . Shellfish Allergy Anaphylaxis  . Prednisone     "psychosis"  . Venomil Honey Bee Venom [Honey Bee Venom] Swelling    Localized swelling    IV Location/Drains/Wounds Patient Lines/Drains/Airways Status   Active Line/Drains/Airways    Name:   Placement date:   Placement time:   Site:   Days:   Peripheral IV 06/24/19 Left Antecubital   06/24/19    2015    Antecubital   1   Incision (Closed) 03/18/15 Leg Right   03/18/15    1800     1560   Incision (Closed) 03/18/15 Chest Other (Comment)   03/18/15    1800     1560   Incision (Closed) 03/18/15 Abdomen Right;Left;Mid   03/18/15    2200     1560   Pressure Injury 09/02/16 Stage II -  Partial thickness loss of dermis presenting as a shallow open ulcer with a red, pink wound bed without slough.   09/02/16    2000  1026          Labs/Imaging Results for orders placed or performed during the hospital encounter of 06/24/19 (from the past 48 hour(s))  Type and screen [facility]     Status: None (Preliminary result)   Collection Time: 06/24/19  6:39 PM  Result Value Ref Range   ABO/RH(D) AB POS    Antibody Screen NEG    Sample Expiration      06/27/2019,2359 Performed at [facility], [place] [address], [place], [place] [address]    Unit Number [id number]    Blood Component Type RED CELLS,LR    Unit division 00    Status of Unit ALLOCATED    Transfusion Status OK TO TRANSFUSE    Crossmatch Result Compatible    Unit Number [id number]    Blood Component Type RED CELLS,LR    Unit division 00    Status of Unit ALLOCATED    Transfusion Status OK TO TRANSFUSE    Crossmatch Result Compatible    Comprehensive metabolic panel     Status: Abnormal   Collection Time: 06/24/19  6:40 PM  Result Value Ref Range   Sodium 139 135 - 145 mmol/L   Potassium 4.3 3.5 - 5.1 mmol/L   Chloride 104 98 - 111 mmol/L   CO2 26 22 - 32 mmol/L   Glucose, Bld 198 (H) 70 - 99 mg/dL    Comment: Glucose reference range applies only to samples taken after fasting for at least 8 hours.   BUN 23 8 - 23 mg/dL   Creatinine, Ser 1.07 0.61 - 1.24 mg/dL   Calcium 9.3 8.9 - 10.3 mg/dL   Total Protein 6.6 6.5 - 8.1 g/dL   Albumin 3.6 3.5 - 5.0 g/dL   AST 22 15 - 41 U/L   ALT 23 0 - 44 U/L   Alkaline Phosphatase 67 38 - 126 U/L   Total Bilirubin 0.5 0.3 - 1.2 mg/dL   GFR calc non Af Amer >60 >60 mL/min   GFR calc Af Amer >60 >60 mL/min   Anion gap 9 5 - 15    Comment: Performed at [facility], [place] [address] [place], [place] [address]  CBC     Status: Abnormal   Collection Time: 06/24/19  6:40 PM  Result Value Ref Range   WBC 7.9 4.0 - 10.5 K/uL   RBC 3.52 (L) 4.22 - 5.81 MIL/uL   Hemoglobin 11.8 (L) 13.0 - 17.0 g/dL   HCT 35.2 (L) 39.0 - 52.0 %   MCV 100.0 80.0 - 100.0 fL   MCH 33.5 26.0 - 34.0 pg   MCHC 33.5 30.0 - 36.0 g/dL   RDW 13.7 11.5 - 15.5 %   Platelets 203 150 - 400 K/uL   nRBC 0.0 0.0 - 0.2 %    Comment: Performed at [facility], [place] [address], [place], [place] [address]  ABO/Rh     Status: None   Collection Time: 06/24/19  6:40 PM  Result Value Ref Range   ABO/RH(D)      AB POS Performed at [facility], [place] [address], [place], [place] [address]   Occult blood card to lab, stool     Status: Abnormal   Collection Time: 06/24/19  7:30 PM  Result Value Ref Range   Fecal Occult Bld POSITIVE (A) NEGATIVE    Comment: Performed at [facility], [place] [address] [place], [place] [address]  SARS CORONAVIRUS  2 (TAT 6-24 HRS) Nasopharyngeal Nasopharyngeal Swab     Status: None   Collection Time: 06/24/19 10:21 PM    Specimen: Nasopharyngeal Swab  Result Value Ref Range   SARS Coronavirus 2 NEGATIVE NEGATIVE    Comment: (NOTE) SARS-CoV-2 target nucleic acids are NOT DETECTED. The SARS-CoV-2 RNA is generally detectable in upper and lower respiratory specimens during the acute phase of infection. Negative results do not preclude SARS-CoV-2 infection, do not rule out co-infections with other pathogens, and should not be used as the sole basis for treatment or other patient management decisions. Negative results must be combined with clinical observations, patient history, and epidemiological information. The expected result is Negative. Fact Sheet for Patients: SugarRoll.be Fact Sheet for Healthcare Providers: https://www.woods-mathews.com/ This test is not yet approved or cleared by the [place] FDA and  has been authorized for detection and/or diagnosis of SARS-CoV-2 by FDA under an Emergency Use Authorization (EUA). This EUA will remain  in effect (meaning this test can be used) for the duration of the COVID-19 declaration under Section 56 4(b)(1) of the Act, 21 U.S.C. section 360bbb-3(b)(1), unless the authorization is terminated or revoked sooner. Performed at [facility], [place] [address], [place], [place] [address]   Hemoglobin A1c     Status: Abnormal   Collection Time: 06/25/19  4:46 AM  Result Value Ref Range   Hgb A1c MFr Bld 6.3 (H) 4.8 - 5.6 %    Comment: (NOTE) Pre diabetes:          5.7%-6.4% Diabetes:              >6.4% Glycemic control for   <7.0% adults with diabetes    Mean Plasma Glucose 134.11 mg/dL    Comment: Performed at [place] [address], [place], [place] [address]  Hemoglobin and hematocrit, blood     Status: Abnormal   Collection Time: 06/25/19  4:46 AM  Result Value Ref Range   Hemoglobin 8.9 (L) 13.0 - 17.0 g/dL   HCT 26.8 (L) 39.0 - 52.0 %    Comment: Performed at [facility], [place] [address] [place], [place] [address]  CBG monitoring, ED     Status: Abnormal   Collection Time: 06/25/19  4:52 AM  Result Value Ref Range   Glucose-Capillary 107 (H) 70 - 99 mg/dL    Comment: Glucose reference range applies only to samples taken after fasting for at least 8 hours.  Prepare RBC (crossmatch)     Status: None   Collection Time: 06/25/19  6:30 AM  Result Value Ref Range   Order Confirmation      ORDER PROCESSED BY BLOOD BANK Performed at [facility], [place] [address], [place], [place] [address]   CBG monitoring, ED     Status: Abnormal   Collection Time: 06/25/19  8:07 AM  Result Value Ref Range   Glucose-Capillary 110 (H) 70 - 99 mg/dL    Comment: Glucose reference range applies only to samples taken after fasting for at least 8 hours.  CBG monitoring, ED     Status: Abnormal   Collection Time: 06/25/19 11:48 AM  Result Value Ref Range   Glucose-Capillary 126 (H) 70 - 99 mg/dL    Comment: Glucose reference range applies only to samples taken after fasting for at least 8 hours.   CT Angio Abd/Pel W and/or Wo Contrast  Result Date: 06/24/2019 CLINICAL DATA:  79 year old male with GI bleed. Right red blood  per rectum. EXAM: CTA ABDOMEN AND PELVIS WITHOUT AND WITH CONTRAST TECHNIQUE: Multidetector CT imaging of the abdomen and pelvis was performed using the standard protocol during bolus administration of intravenous contrast. Multiplanar reconstructed images and MIPs were obtained and reviewed to evaluate the vascular anatomy. CONTRAST:  175mL OMNIPAQUE IOHEXOL 350 MG/ML SOLN COMPARISON:  CT abdomen pelvis dated 02/04/2015. FINDINGS: VASCULAR Aorta: There is advanced atherosclerotic calcification. There is chronic occlusion and narrowing of the distal aorta and prior postsurgical changes. The aorta is patent. No aneurysmal dilatation or dissection. No periaortic inflammatory changes or fluid collection. 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 There is atherosclerotic calcification of the celiac artery. The celiac artery and its major branches are patent. There is a replaced left hepatic artery from the left gastric artery variant anatomy. SMA: There is atherosclerotic calcification of the SMA. The SMA is patent. Renals: There is atherosclerotic calcification of the origins of the renal arteries. The renal arteries remain patent. IMA: The IMA is poorly visualized but appears patent. Inflow: Atherosclerotic calcification of the iliac arteries. The iliac arteries remain patent. No aneurysmal dilatation or dissection. Proximal Outflow: Bilateral common femoral and visualized portions of the superficial and profunda femoral arteries are patent without evidence of aneurysm, dissection, vasculitis or significant stenosis. Veins: No obvious venous abnormality within the limitations of this arterial phase study. Review of the MIP images confirms the above findings. NON-VASCULAR Lower chest: There is mild cardiomegaly with multi vessel coronary vascular calcification and postsurgical changes of CABG. The visualized lung bases are clear. No intra-abdominal free air or free fluid. Hepatobiliary: There is a 1 cm left hepatic cyst. Several additional subcentimeter hypodense foci are too small to characterize. No intrahepatic biliary ductal dilatation. Several stones noted in the gallbladder. No pericholecystic fluid or evidence of acute cholecystitis by CT. Pancreas: Small cystic lesions adjacent to the head of the pancreas measuring up to 1.8 cm (series 5, image 60). This are suboptimally characterized but may represent side branch IPMN or mucinous lesions. There is no dilatation of the main pancreatic duct or gland atrophy. No active inflammatory changes. No peripancreatic fluid collection. Spleen: Normal in size without focal abnormality. Adrenals/Urinary Tract: The adrenal glands are unremarkable. Several nonobstructing bilateral renal  calculi measure up to 10 mm in the inferior pole of the left kidney. There is no hydronephrosis on either side. Right renal inferior pole atrophy and scarring. There is a 3 cm right renal inferior pole cyst as well as smaller exophytic cyst. The visualized ureters appear unremarkable. There is mild thickened and lobulated appearance of the bladder wall, likely related to chronic bladder outlet obstruction. There is mild perivesical haziness. Correlation with urinalysis recommended to exclude cystitis. Stomach/Bowel: There is diffuse colonic diverticulosis. There is mild pericolonic haziness and inflammatory changes centered at a diverticula in the distal ascending colon (series 11, image 65 consistent with acute diverticulitis. Faint contrast within the lumen of the distal ascending colon (series 5 image 85) suspicious for small diverticular bleed. There is no bowel obstruction. The appendix is normal and extends into the right hemipelvis. The tip of the appendix extends into a small right inguinal hernia. Lymphatic: No adenopathy. Reproductive: Enlarged prostate gland measuring approximately 6 cm in transverse axial diameter. The seminal vesicles are symmetric. Other: Prior hernia repair in the anterior pelvic wall. There is a small residual right inguinal hernia. Musculoskeletal: There is an acute nondisplaced fracture of the greater trochanter of the right femur with probable extension  of the fracture into the intertrochanteric ridge. The bones are osteopenic. No dislocation. IMPRESSION: VASCULAR No aortic aneurysm or dissection. 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 NON-VASCULAR 1. Acute diverticulitis of the distal ascending colon with probable associated small diverticular bleed. 2. Acute nondisplaced fracture of the greater trochanter of the right femur with extension into the intertrochanteric ridge. No dislocation. 3. Cholelithiasis. 4. Nonobstructing bilateral renal  calculi. No hydronephrosis. 5. 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 Correlation with urinalysis recommended to exclude cystitis. 6. Small cystic lesions adjacent to the head of the pancreas, incompletely characterized, possibly side branch IPMN or mucinous lesions. Further evaluation with nonemergent MRI recommended. Electronically Signed   By: Anner Crete M.D.   On: 06/24/2019 21:52    Pending Labs Unresulted Labs (From admission, onward)    Start     Ordered   06/24/19 2319  Hemoglobin and hematocrit, blood  Now then every 4 hours,   R (with STAT occurrences)     06/24/19 2318          Vitals/Pain Today's Vitals   06/25/19 1200 06/25/19 1330 06/25/19 1331 06/25/19 1350  BP: (!) 126/102 (!) 145/89    Pulse: 77  69 65  Resp: 20 14 13 15   Temp:      TempSrc:      SpO2: 95% 96% 100% 98%  Weight:      Height:      PainSc:        Isolation Precautions No active isolations  Medications Medications  sodium chloride (PF) 0.9 % injection (has no administration in time range)  cefTRIAXone (ROCEPHIN) 2 g in sodium chloride 0.9 % 100 mL IVPB (has no administration in time range)  metroNIDAZOLE (FLAGYL) IVPB 500 mg (0 mg Intravenous Stopped 06/25/19 0907)  atorvastatin (LIPITOR) tablet 80 mg (has no administration in time range)  donepezil (ARICEPT) tablet 10 mg (has no administration in time range)  escitalopram (LEXAPRO) tablet 10 mg (10 mg Oral Given 06/25/19 1033)  levothyroxine (SYNTHROID) tablet 75 mcg (75 mcg Oral Given 06/25/19 0802)  insulin aspart (novoLOG) injection 0-9 Units (1 Units Subcutaneous Given 06/25/19 1213)  acetaminophen (TYLENOL) tablet 650 mg (has no administration in time range)    Or  acetaminophen (TYLENOL) suppository 650 mg (has no administration in time range)  0.9 %  sodium chloride infusion (has no administration in time range)  0.9 %  sodium chloride infusion (Manually program via Guardrails IV  Fluids) (has no administration in time range)  sodium chloride 0.9 % bolus 1,000 mL (0 mLs Intravenous Stopped 06/25/19 0426)  iohexol (OMNIPAQUE) 350 MG/ML injection 100 mL (100 mLs Intravenous Contrast Given 06/24/19 2042)  cefTRIAXone (ROCEPHIN) 2 g in sodium chloride 0.9 % 100 mL IVPB (0 g Intravenous Stopped 06/24/19 2321)    And  metroNIDAZOLE (FLAGYL) IVPB 500 mg (0 mg Intravenous Stopped 06/25/19 0023)    Mobility walks

## 2019-06-25 NOTE — Consult Note (Addendum)
Referring Provider: Dr. Landis Gandy Primary Care Physician:  Eulas Post, MD Primary Gastroenterologist:  Dr. Carlean Purl   Reason for Consultation:  Painless Hematochezia   HPI: Miguel Vazquez is a 79 y.o. male with a past medical history significant for arthritis, hypertension, hyperlipidemia, coronary artery disease, MI in 0092, diastolic and systolic CHF, abdominal aortic aneurysm with repainr, brain aneurysm, DM II, chronic kidney disease, kidney stones, GERD and colon polyps.  He was seen in the office by his primary care physician Dr. Carolann Littler on 06/24/2019 with complaints of passing passing bright red blood from the rectum which occurred around 4:30 PM. No stool was passed. No prior constipation or straining.   He felt a bit lightheaded and he was sent directly to [facility] emergency room for further evaluation.   He reported feeling well until yesterday afternoon. He described having an uneasy feeling to his stomach, no pain. He went to the bathroom to pass a BM but when he got up from the commode he saw a large amount "a quart" of bright red blood in the commode. He had 2 additional episodes of passing large volume blood from the rectum. He denies ever having this type of rectal bleeding before. He felt slightly dizzy without chest pain or SOB. He takes ASA 61m once daily. No other NSAIDS. No alcohol use. Denied prior history of diverticulitis. His most recent colonoscopy was in 2019 which showed diverticulosis throughout the entire colon. He denies having any dysphagia, heartburn or stomach pain.  No weight loss. Appetite is good. No family history of colon cancer. He injured his right leg about 8 weeks ago after he fell to the ground to pick up a check that fell out of his pocket.    ED course: Sodium 139.  Potassium 4.3.  Glucose 198.  BUN 23.  Creatinine 1.07.  Anion gap 9.  Alk phos 67.  Albumin 3.6.  AST 22.  ALT 23.  Total bili 0.5.  WBC 7.9.  Hemoglobin  11.8. (Hg 14.2 on 04/15/2019). Hematocrit 35.2.  MCV 100.0.  Platelet 203. SARS coronavirus 2 negative.  Patient has received 1st Covid vaccination.  FOBT positive.  He received 1L NS IV bolus, Ceftriaxone and Metronidazole IV in the ED   Abdominal pelvic CT angiogram 06/24/2019: Vascular impression: No aortic aneurysm or dissection. 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  Nonvascular impresson: 1. Acute diverticulitis of the distal ascending colon with probable associated small diverticular bleed. 2. Acute nondisplaced fracture of the greater trochanter of the right femur with extension into the intertrochanteric ridge. No dislocation. 3. Cholelithiasis. 4. Nonobstructing bilateral renal calculi. No hydronephrosis. 5. 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 Correlation with urinalysis recommended to exclude cystitis. 6. Small cystic lesions adjacent to the head of the pancreas, incompletely characterized, possibly side branch IPMN or mucinous lesions. Further evaluation with nonemergent MRI recommended  Colonoscopy 01/02/2018 by Dr. GCarlean Purl  - Severe diverticulosis in the entire examined colon. - The examination was otherwise normal on direct and retroflexion views. - No specimens collected. - Personal history of colonic polyps. 3 adenomas 2015 - No further colonoscopies recommended due to age  ECHO 12/13/2015: LV EF 40 -45%   Past Medical History:  Diagnosis Date  . Arthritis   . Brain aneurysm 2018   followed by Dr XErlinda Hong . Cataract   . Chronic kidney disease    kidney stone- 40 years ago  . Coronary  artery disease   . Diabetes mellitus   . GERD (gastroesophageal reflux disease)   . Hx of abdominal aortic aneurysm 1998  . Hyperlipidemia   . Hypertension   . Myocardial infarction ([place])    2016  . Personal history of colonic polyps - adenomas 09/09/2013    Past Surgical History:    Procedure Laterality Date  . ABDOMINAL AORTIC ANEURYSM REPAIR  1998  . CARDIAC CATHETERIZATION N/A 03/18/2015   Procedure: Left Heart Cath and Coronary Angiography;  Surgeon: Peter M Martinique, MD;  Location: [place] [facility];  Service: Cardiovascular;  Laterality: N/A;  . cataract surgery Right   . COLONOSCOPY    . CORONARY ARTERY BYPASS GRAFT N/A 03/18/2015   Procedure: CORONARY ARTERY BYPASS GRAFTING (CABG) x  three, using left internal mammary artery and right leg greater saphenous vein harvested endoscopically;  Surgeon: Melrose Nakayama, MD;  Location: [place];  Service: Open Heart Surgery;  Laterality: N/A;  . INGUINAL HERNIA REPAIR Bilateral   . INTRAOPERATIVE TRANSESOPHAGEAL ECHOCARDIOGRAM N/A 03/18/2015   Procedure: INTRAOPERATIVE TRANSESOPHAGEAL ECHOCARDIOGRAM;  Surgeon: Melrose Nakayama, MD;  Location: [place];  Service: Open Heart Surgery;  Laterality: N/A;  . open heart surgery  2016  . [place]    Prior to Admission medications   Medication Sig Start Date End Date Taking? Authorizing Provider  aspirin EC 81 MG tablet Take 1 tablet (81 mg total) by mouth daily. 06/07/17  Yes Martinique, Peter M, MD  atorvastatin (LIPITOR) 80 MG tablet TAKE 1 TABLET (80 MG TOTAL) BY MOUTH DAILY AT 6 PM. 12/19/18  Yes [name], [name], MD  carvedilol (COREG) 6.25 MG tablet TAKE 1 TABLET BY MOUTH TWICE A DAY 08/11/18  Yes Martinique, Peter M, MD  donepezil (ARICEPT) 10 MG tablet Take 1 tablet (10 mg total) by mouth at bedtime. 06/15/19  Yes [name], [name], MD  EPINEPHrine 0.3 mg/0.3 mL IJ SOAJ injection Inject 0.3 mLs (0.3 mg total) into the muscle as needed for anaphylaxis. 10/30/18  Yes [name], [name], MD  escitalopram (LEXAPRO) 10 MG tablet Take 10 mg by mouth daily.   Yes [provider]  levothyroxine (SYNTHROID) 75 MCG tablet TAKE 1 TABLET BY MOUTH EVERY DAY 04/29/19  Yes [name], [name], MD  losartan (COZAAR) 100 MG tablet TAKE 1 TABLET BY MOUTH EVERY DAY  04/29/19  Yes [name], [name], MD  metFORMIN (GLUCOPHAGE) 1000 MG tablet TAKE 1 TABLET BY MOUTH TWICE A DAY 08/01/18  Yes [name], [name], MD  Multiple Vitamin (MULTIVITAMIN) tablet Take 1 tablet by mouth daily. Centrium Silver once daily    Yes [provider]  vitamin B-12 (CYANOCOBALAMIN) 1000 MCG tablet Take 1,000 mcg by mouth daily.   Yes [provider]  vitamin C (ASCORBIC ACID) 500 MG tablet Take 500 mg by mouth every evening.    Yes [provider]  glucose blood (FREESTYLE LITE) test strip Check Blood Sugars 1-2 times per day. DX: E11.9. 01/10/16   [name], [name], MD  Lancets (FREESTYLE) lancets Check Blood Sugars 1-2 times per day. DX: E11.9 01/10/16   [name], [name], MD    Current Facility-Administered Medications  Medication Dose Route Frequency Provider Last Rate Last Admin  . 0.9 %  sodium chloride infusion (Manually program via Guardrails IV Fluids)   Intravenous Once Shela Leff, MD      . 0.9 %  sodium chloride infusion   Intravenous Continuous Shela Leff, MD      .  acetaminophen (TYLENOL) tablet 650 mg  650 mg Oral Q6H PRN Shela Leff, MD       Or  . acetaminophen (TYLENOL) suppository 650 mg  650 mg Rectal Q6H PRN Shela Leff, MD      . atorvastatin (LIPITOR) tablet 80 mg  80 mg Oral q1800 Shela Leff, MD      . cefTRIAXone (ROCEPHIN) 2 g in sodium chloride 0.9 % 100 mL IVPB  2 g Intravenous Q24H Shela Leff, MD      . donepezil (ARICEPT) tablet 10 mg  10 mg Oral QHS Shela Leff, MD      . escitalopram (LEXAPRO) tablet 10 mg  10 mg Oral Daily Shela Leff, MD   10 mg at 06/25/19 1033  . insulin aspart (novoLOG) injection 0-9 Units  0-9 Units Subcutaneous Q4H Shela Leff, MD      . levothyroxine (SYNTHROID) tablet 75 mcg  75 mcg Oral Q0600 Shela Leff, MD   75 mcg at 06/25/19 0802  . metroNIDAZOLE (FLAGYL) IVPB 500 mg  500 mg Intravenous Quay Burow, MD    Stopped at 06/25/19 0907   Current Outpatient Medications  Medication Sig Dispense Refill  . aspirin EC 81 MG tablet Take 1 tablet (81 mg total) by mouth daily. 90 tablet 3  . atorvastatin (LIPITOR) 80 MG tablet TAKE 1 TABLET (80 MG TOTAL) BY MOUTH DAILY AT 6 PM. 90 tablet 2  . carvedilol (COREG) 6.25 MG tablet TAKE 1 TABLET BY MOUTH TWICE A DAY 180 tablet 3  . donepezil (ARICEPT) 10 MG tablet Take 1 tablet (10 mg total) by mouth at bedtime. 90 tablet 3  . EPINEPHrine 0.3 mg/0.3 mL IJ SOAJ injection Inject 0.3 mLs (0.3 mg total) into the muscle as needed for anaphylaxis. 2 each 3  . escitalopram (LEXAPRO) 10 MG tablet Take 10 mg by mouth daily.    Marland Kitchen levothyroxine (SYNTHROID) 75 MCG tablet TAKE 1 TABLET BY MOUTH EVERY DAY 90 tablet 1  . losartan (COZAAR) 100 MG tablet TAKE 1 TABLET BY MOUTH EVERY DAY 90 tablet 1  . metFORMIN (GLUCOPHAGE) 1000 MG tablet TAKE 1 TABLET BY MOUTH TWICE A DAY 180 tablet 3  . Multiple Vitamin (MULTIVITAMIN) tablet Take 1 tablet by mouth daily. Centrium Silver once daily     . vitamin B-12 (CYANOCOBALAMIN) 1000 MCG tablet Take 1,000 mcg by mouth daily.    . vitamin C (ASCORBIC ACID) 500 MG tablet Take 500 mg by mouth every evening.     Marland Kitchen glucose blood (FREESTYLE LITE) test strip Check Blood Sugars 1-2 times per day. DX: E11.9. 100 each 5  . Lancets (FREESTYLE) lancets Check Blood Sugars 1-2 times per day. DX: E11.9 100 each 5    Allergies as of 06/24/2019 - Review Complete 06/24/2019  Allergen Reaction Noted  . Food Anaphylaxis 03/06/2017  . Shellfish allergy Anaphylaxis 03/06/2017  . Prednisone  02/07/2017  . Venomil honey bee venom [honey bee venom] Swelling 09/03/2016    Family History  Problem Relation Age of Onset  . Bone cancer Mother   . Diabetes Paternal Aunt   . Stroke Son   . Down syndrome Son   . Colon cancer Neg Hx   . Esophageal cancer Neg Hx   . Rectal cancer Neg Hx   . Stomach cancer Neg Hx     Social History   Socioeconomic History    . Marital status: Married    Spouse name: [name]  . Number of children: 3  . Years of education:  Not on file  . Highest education level: Not on file  Occupational History  . Not on file  Tobacco Use  . Smoking status: Former Smoker    Packs/day: 1.50    Years: 20.00    Pack years: 30.00    Types: Cigarettes    Quit date: 08/24/1986    Years since quitting: 32.8  . Smokeless tobacco: Never Used  Substance and Sexual Activity  . Alcohol use: Not Currently    Comment: occ  . Drug use: No  . Sexual activity: Not on file  Other Topics Concern  . Not on file  Social History Narrative  . Not on file   Social Determinants of Health   Financial Resource Strain:   . Difficulty of Paying Living Expenses:   Food Insecurity:   . Worried About Charity fundraiser in the Last Year:   . Arboriculturist in the Last Year:   Transportation Needs:   . Film/video editor (Medical):   Marland Kitchen Lack of Transportation (Non-Medical):   Physical Activity:   . Days of Exercise per Week:   . Minutes of Exercise per Session:   Stress:   . Feeling of Stress :   Social Connections:   . Frequency of Communication with Friends and Family:   . Frequency of Social Gatherings with Friends and Family:   . Attends Religious Services:   . Active Member of Clubs or Organizations:   . Attends Archivist Meetings:   Marland Kitchen Marital Status:   Intimate Partner Violence:   . Fear of Current or Ex-Partner:   . Emotionally Abused:   Marland Kitchen Physically Abused:   . Sexually Abused:     Review of Systems: See HPI, all other systems reviewed and are negative.   Physical Exam: Vital signs in last 24 hours: Temp:  [97.7 F (36.5 C)-97.8 F (36.6 C)] 97.8 F (36.6 C) (03/24 1838) Pulse Rate:  [63-110] 76 (03/25 1035) Resp:  [13-22] 16 (03/25 1035) BP: (92-147)/(51-109) 147/109 (03/25 1035) SpO2:  [90 %-100 %] 95 % (03/25 1035) Weight:  [64 kg-69.5 kg] 64 kg (03/24 1831)   General:  Alert,  well-developed,  well-nourished, pleasant and cooperative in NAD. Head:  Normocephalic and atraumatic. Eyes:  No scleral icterus. Conjunctiva pink. Ears:  Normal auditory acuity. Nose:  No deformity, discharge or lesions. Mouth:  No ulcers or lesions.  Neck:  Supple. No lymphadenopathy or thyromegaly.  Lungs:  Breath sounds clear throughout.  Heart:  RRR, 2/6 systolic murmur.  Abdomen:  Soft, nondistended. Nontender. Positive BS x 4 quads. Extensive midline scar with ? mesh intact. No HSM.  Rectal:  Atypical puncture like scaring below the coccyx and to the perianal folds. No fistula or abscess. A firm 1.5 cm nodule  about 1 cm inside anterior anal orifice palpated. No blood in the rectum.  Msk:  Symmetrical without gross deformities.  Pulses:  Normal pulses noted. Extremities:  Without clubbing or edema. Neurologic:  Alert and  oriented x4. No focal deficits.  Skin:  Intact without significant lesions or rashes. Psych:  Alert and cooperative. Normal mood and affect.  Intake/Output from previous day: 03/24 0701 - 03/25 0700 In: 1198.2 [IV Piggyback:1198.2] Out: -  Intake/Output this shift: No intake/output data recorded.  Lab Results: Recent Labs    06/24/19 1840 06/25/19 0446  WBC 7.9  --   HGB 11.8* 8.9*  HCT 35.2* 26.8*  PLT 203  --    BMET Recent Labs  06/24/19 1840  NA 139  K 4.3  CL 104  CO2 26  GLUCOSE 198*  BUN 23  CREATININE 1.07  CALCIUM 9.3   LFT Recent Labs    06/24/19 1840  PROT 6.6  ALBUMIN 3.6  AST 22  ALT 23  ALKPHOS 67  BILITOT 0.5   PT/INR No results for input(s): LABPROT, INR in the last 72 hours. Hepatitis Panel No results for input(s): HEPBSAG, HCVAB, HEPAIGM, HEPBIGM in the last 72 hours.    Studies/Results: CT Angio Abd/Pel W and/or Wo Contrast  Result Date: 06/24/2019 CLINICAL DATA:  79 year old male with GI bleed. Right red blood per rectum. EXAM: CTA ABDOMEN AND PELVIS WITHOUT AND WITH CONTRAST TECHNIQUE: Multidetector CT imaging of the  abdomen and pelvis was performed using the standard protocol during bolus administration of intravenous contrast. Multiplanar reconstructed images and MIPs were obtained and reviewed to evaluate the vascular anatomy. CONTRAST:  166m OMNIPAQUE IOHEXOL 350 MG/ML SOLN COMPARISON:  CT abdomen pelvis dated 02/04/2015. FINDINGS: VASCULAR Aorta: There is advanced atherosclerotic calcification. There is chronic occlusion and narrowing of the distal aorta and prior postsurgical changes. The aorta is patent. No aneurysmal dilatation or dissection. No periaortic inflammatory changes or fluid collection. Celiac: There is a common origin of the celiac artery and SMA. There is atherosclerotic calcification of the celiac artery. The celiac artery and its major branches are patent. There is a replaced left hepatic artery from the left gastric artery variant anatomy. SMA: There is atherosclerotic calcification of the SMA. The SMA is patent. Renals: There is atherosclerotic calcification of the origins of the renal arteries. The renal arteries remain patent. IMA: The IMA is poorly visualized but appears patent. Inflow: Atherosclerotic calcification of the iliac arteries. The iliac arteries remain patent. No aneurysmal dilatation or dissection. Proximal Outflow: Bilateral common femoral and visualized portions of the superficial and profunda femoral arteries are patent without evidence of aneurysm, dissection, vasculitis or significant stenosis. Veins: No obvious venous abnormality within the limitations of this arterial phase study. Review of the MIP images confirms the above findings. NON-VASCULAR Lower chest: There is mild cardiomegaly with multi vessel coronary vascular calcification and postsurgical changes of CABG. The visualized lung bases are clear. No intra-abdominal free air or free fluid. Hepatobiliary: There is a 1 cm left hepatic cyst. Several additional subcentimeter hypodense foci are too small to characterize. No  intrahepatic biliary ductal dilatation. Several stones noted in the gallbladder. No pericholecystic fluid or evidence of acute cholecystitis by CT. Pancreas: Small cystic lesions adjacent to the head of the pancreas measuring up to 1.8 cm (series 5, image 60). This are suboptimally characterized but may represent side branch IPMN or mucinous lesions. There is no dilatation of the main pancreatic duct or gland atrophy. No active inflammatory changes. No peripancreatic fluid collection. Spleen: Normal in size without focal abnormality. Adrenals/Urinary Tract: The adrenal glands are unremarkable. Several nonobstructing bilateral renal calculi measure up to 10 mm in the inferior pole of the left kidney. There is no hydronephrosis on either side. Right renal inferior pole atrophy and scarring. There is a 3 cm right renal inferior pole cyst as well as smaller exophytic cyst. The visualized ureters appear unremarkable. There is mild thickened and lobulated appearance of the bladder wall, likely related to chronic bladder outlet obstruction. There is mild perivesical haziness. Correlation with urinalysis recommended to exclude cystitis. Stomach/Bowel: There is diffuse colonic diverticulosis. There is mild pericolonic haziness and inflammatory changes centered at a diverticula in the distal ascending colon (  series 11, image 65 consistent with acute diverticulitis. Faint contrast within the lumen of the distal ascending colon (series 5 image 85) suspicious for small diverticular bleed. There is no bowel obstruction. The appendix is normal and extends into the right hemipelvis. The tip of the appendix extends into a small right inguinal hernia. Lymphatic: No adenopathy. Reproductive: Enlarged prostate gland measuring approximately 6 cm in transverse axial diameter. The seminal vesicles are symmetric. Other: Prior hernia repair in the anterior pelvic wall. There is a small residual right inguinal hernia. Musculoskeletal: There  is an acute nondisplaced fracture of the greater trochanter of the right femur with probable extension of the fracture into the intertrochanteric ridge. The bones are osteopenic. No dislocation. IMPRESSION: VASCULAR No aortic aneurysm or dissection. 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 NON-VASCULAR 1. Acute diverticulitis of the distal ascending colon with probable associated small diverticular bleed. 2. Acute nondisplaced fracture of the greater trochanter of the right femur with extension into the intertrochanteric ridge. No dislocation. 3. Cholelithiasis. 4. Nonobstructing bilateral renal calculi. No hydronephrosis. 5. 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 Correlation with urinalysis recommended to exclude cystitis. 6. Small cystic lesions adjacent to the head of the pancreas, incompletely characterized, possibly side branch IPMN or mucinous lesions. Further evaluation with nonemergent MRI recommended. Electronically Signed   By: Anner Crete M.D.   On: 06/24/2019 21:52    IMPRESSION/PLAN:  71.  79 year old male with painless hematochezia, most likely diverticular bleed with acute anemia. CT abdomen pelvis showed acute diverticulitis of the distal ascending colon with probable associated small diverticular bleed. Unlikely diverticulitis.  Hg 11.8 with base line Hg 14.2. BUN 23. No further hematochezia since arriving to the ED. He is hemodynamically stable. -Transfuse for Hg < 8 -Check H/H Q 6 hrs x 4 -IVF per the hospitalist  -IV gross active GI  Bleeding occurs patient will require IR evaluation for embolization.  -No plans for colonoscopy at this time -Clear liquid diet  -No further antibiotics recommended   2. Anal nodule -Follow up as outpatient   3.  Small cystic pancreatic lesions adjacent to the head of the pancreas identified by abd/pelvic CTA  4. Gallstones per abd/pevic CTA, asymptomatic   5.   History of CAD and CHF  6. DM II  7. Right femur fracture  Further recommendations per Dr. Verta Ellen Dorathy Daft  06/25/2019, 10:52 AM  GI ATTENDING  History, laboratories, x-rays reviewed.  Prior colonoscopy report reviewed.  Patient personally seen and examined.  Wife at bedside.  Patient presents with several episodes of painless hematochezia.  Moderate to large volume.  Hemodynamically stable.  Has not moved bowels since last night.  Advanced imaging shows evidence of bleeding in the sigmoid colon.  Though radiographically it suggested diverticulitis, it is not such clinically.  Thus, this is an acute diverticular bleed which has, for the time being, stopped..  Patient may have clear liquid diet.  Monitor his stools and blood counts.  He will not need colonoscopy.  He does not need antibiotics.  We will continue to follow.  Docia Chuck. Geri Seminole., M.D. [facility] Division of Gastroenterology

## 2019-06-26 ENCOUNTER — Telehealth: Payer: Self-pay

## 2019-06-26 DIAGNOSIS — K5792 Diverticulitis of intestine, part unspecified, without perforation or abscess without bleeding: Secondary | ICD-10-CM

## 2019-06-26 DIAGNOSIS — K922 Gastrointestinal hemorrhage, unspecified: Secondary | ICD-10-CM

## 2019-06-26 LAB — CBC
HCT: 31.7 % — ABNORMAL LOW (ref 39.0–52.0)
Hemoglobin: 10.6 g/dL — ABNORMAL LOW (ref 13.0–17.0)
MCH: 33.2 pg (ref 26.0–34.0)
MCHC: 33.4 g/dL (ref 30.0–36.0)
MCV: 99.4 fL (ref 80.0–100.0)
Platelets: 171 10*3/uL (ref 150–400)
RBC: 3.19 MIL/uL — ABNORMAL LOW (ref 4.22–5.81)
RDW: 13.6 % (ref 11.5–15.5)
WBC: 7.2 10*3/uL (ref 4.0–10.5)
nRBC: 0 % (ref 0.0–0.2)

## 2019-06-26 LAB — BASIC METABOLIC PANEL
Anion gap: 10 (ref 5–15)
BUN: 17 mg/dL (ref 8–23)
CO2: 29 mmol/L (ref 22–32)
Calcium: 9.6 mg/dL (ref 8.9–10.3)
Chloride: 101 mmol/L (ref 98–111)
Creatinine, Ser: 0.99 mg/dL (ref 0.61–1.24)
GFR calc Af Amer: >60 mL/min (ref >60)
GFR calc non Af Amer: >60 mL/min (ref >60)
Glucose, Bld: 129 mg/dL — ABNORMAL HIGH (ref 70–99)
Potassium: 4 mmol/L (ref 3.5–5.1)
Sodium: 140 mmol/L (ref 135–145)

## 2019-06-26 LAB — GLUCOSE, CAPILLARY
Glucose-Capillary: 100 mg/dL — ABNORMAL HIGH (ref 70–99)
Glucose-Capillary: 126 mg/dL — ABNORMAL HIGH (ref 70–99)
Glucose-Capillary: 264 mg/dL — ABNORMAL HIGH (ref 70–99)
Glucose-Capillary: 57 mg/dL — ABNORMAL LOW (ref 70–99)
Glucose-Capillary: 164 mg/dL — ABNORMAL HIGH (ref 70–99)

## 2019-06-26 MED ORDER — CARVEDILOL 6.25 MG PO TABS
6.2500 mg | ORAL_TABLET | Freq: Two times a day (BID) | ORAL | Status: DC
  Administered 2019-06-26: 6.25 mg via ORAL
  Filled 2019-06-26 (×2): qty 1

## 2019-06-26 NOTE — Telephone Encounter (Signed)
Lab order placed at this time; patient to be contacted for a f/u appt;

## 2019-06-26 NOTE — Evaluation (Signed)
Occupational Therapy Evaluation Patient Details Name: Miguel Vazquez MRN: [id number] DOB: 03-31-1941 Today's Date: 06/26/2019    History of Present Illness SABASTIEN Vazquez is a 79 y.o. male with medical history significant of diverticulosis, CAD, chronic combined systolic and diastolic congestive heart failure, diabetes, hypertension, hyperlipidemia, arthritis presenting from his PCPs office for evaluation of rectal bleeding and hypotension. Since this afternoon he has had 3 episodes of large-volume bright red blood per rectum.  No abdominal pain or hematemesis.  He takes a baby aspirin daily and is not on any other blood thinners.  He has received his first Covid vaccine and is due for the second dose next week.      Clinical Impression   Patient is a 79 year old male that lives with his spouse in a multi level home with ramp entry and can live on main floor. Patient reports independent with self care and mobility, has not been using walker recently. Currently patient is supervision level for self care, functional mobility and transfers due to impulsivity. No family present to determine baseline cognition however patient does demonstrate decreased safety awareness stopping and turning quickly in hallways, and difficulty with multitasking. Repeatedly instructed patient to continue ambulating and tell his story in hallway however appears unable to hold conversation unless stationary (poss HOH?). Patient also having difficulty with comprehension when asked if he has [place] aides or assist at home with things like cleaning, cooking. Patient begins describing water damage that happened at this home and how people have been repairing the kitchen. OT will continue to follow to maximize patient safety and independence with self care.    Follow Up Recommendations  No OT follow up    Equipment Recommendations  None recommended by OT       Precautions / Restrictions Precautions Precautions: Fall Precaution  Comments: R femur fx (~6 wks ago) Restrictions Weight Bearing Restrictions: No Other Position/Activity Restrictions: per patient has had two follow up visits with PCP and was told can bear weight on R LE      Mobility Bed Mobility Overal bed mobility: Modified Independent             General bed mobility comments: slightly increased time  Transfers Overall transfer level: Needs assistance Equipment used: None Transfers: Sit to/from Stand Sit to Stand: Supervision;Min guard         General transfer comment: mild impulsivity    Balance Overall balance assessment: History of Falls;Needs assistance Sitting-balance support: Feet supported;No upper extremity supported Sitting balance-Leahy Scale: Good Sitting balance - Comments: seated EOB   Standing balance support: During functional activity;No upper extremity supported Standing balance-Leahy Scale: Fair Standing balance comment: no AD                           ADL either performed or assessed with clinical judgement   ADL Overall ADL's : Needs assistance/impaired Eating/Feeding: Independent   Grooming: Oral care;Wash/dry face;Supervision/safety;Standing   Upper Body Bathing: Set up;Sitting   Lower Body Bathing: Supervison/ safety;Sit to/from stand;Sitting/lateral leans   Upper Body Dressing : Set up;Sitting   Lower Body Dressing: Supervision/safety;Sitting/lateral leans;Sit to/from stand Lower Body Dressing Details (indicate cue type and reason): pt demo ability to doff/don socks seated EOB Toilet Transfer: Min guard;Ambulation Toilet Transfer Details (indicate cue type and reason): simulated with functional mobility, min guard due to impulsivity at times/turning quickly. no overt loss of balance noted Toileting- Clothing Manipulation and Hygiene: Supervision/safety;Sit to/from stand;Sitting/lateral lean  Functional mobility during ADLs: Min guard;Cueing for safety       Vision Baseline  Vision/History: Wears glasses              Pertinent Vitals/Pain Pain Assessment: No/denies pain     Hand Dominance Right   Extremity/Trunk Assessment Upper Extremity Assessment Upper Extremity Assessment: Overall WFL for tasks assessed   Lower Extremity Assessment Lower Extremity Assessment: Defer to PT evaluation   Cervical / Trunk Assessment Cervical / Trunk Assessment: Normal   Communication Communication Communication: No difficulties   Cognition Arousal/Alertness: Awake/alert Behavior During Therapy: WFL for tasks assessed/performed Overall Cognitive Status: No family/caregiver present to determine baseline cognitive functioning Area of Impairment: Attention;Safety/judgement                   Current Attention Level: Focused     Safety/Judgement: Decreased awareness of safety     General Comments: Patient is A/O x4 however is very distractable, tangential. Will stop walking and turn impulsively to talk to therapist, has difficulty with multi tasking (walking and continuing his story)               [place] expects to be discharged to:: Private residence Living Arrangements: Spouse/significant other Available Help at Discharge: Family;Available 24 hours/day Type of Home: House Home Access: Ramped entrance   Entrance Stairs-Rails: Right;Left;Can reach both Home Layout: Multi-level;Able to live on main level with bedroom/bathroom(4 floors) Alternate Level Stairs-Number of Steps: flight Alternate Level Stairs-Rails: Right;Left Bathroom Shower/Tub: Walk-in shower;Tub/shower unit         Home Equipment: Shower seat - built in;Grab bars - toilet;Grab bars - tub/shower;Walker - 2 wheels;Wheelchair - manual          Prior Functioning/Environment Level of Independence: Independent        Comments: Pt reports independent with ADLs, ambulation without AD, 2 falls in last 6 months with R femur fracture.        OT Problem List:  Impaired balance (sitting and/or standing);Decreased safety awareness;Decreased cognition      OT Treatment/Interventions: Self-care/ADL training;Therapeutic exercise;Therapeutic activities;Cognitive remediation/compensation;Patient/family education;Balance training    OT Goals(Current goals can be found in the care plan section) Acute Rehab OT Goals Patient Stated Goal: go home OT Goal Formulation: With patient Time For Goal Achievement: 07/10/19 Potential to Achieve Goals: Good  OT Frequency: Min 2X/week           Co-evaluation PT/OT/SLP Co-Evaluation/Treatment: Yes Reason for Co-Treatment: Necessary to address cognition/behavior during functional activity;To address functional/ADL transfers PT goals addressed during session: Mobility/safety with mobility;Balance;Strengthening/ROM OT goals addressed during session: ADL's and self-care      AM-PAC OT "6 Clicks" Daily Activity     Outcome Measure Help from another person eating meals?: None Help from another person taking care of personal grooming?: A Little Help from another person toileting, which includes using toliet, bedpan, or urinal?: A Little Help from another person bathing (including washing, rinsing, drying)?: A Little Help from another person to put on and taking off regular upper body clothing?: None Help from another person to put on and taking off regular lower body clothing?: A Little 6 Click Score: 20   End of Session Equipment Utilized During Treatment: Gait belt Nurse Communication: Mobility status  Activity Tolerance: Patient tolerated treatment well Patient left: in bed;with call bell/phone within reach;with bed alarm set  OT Visit Diagnosis: History of falling (Z91.81);Other symptoms and signs involving cognitive function  Time: [id number] OT Time Calculation (min): 30 min Charges:  OT General Charges $OT Visit: 1 Visit OT Evaluation $OT Eval Moderate Complexity: [place] OT OT  office: [place] 06/26/2019, 12:55 PM

## 2019-06-26 NOTE — Progress Notes (Addendum)
PROGRESS NOTE    Miguel Vazquez  [id number] DOB: [birth year] DOA: 06/24/2019 PCP: Eulas Post, MD   Brief Narrative: 79 y.o.malewith medical history significant ofdiverticulosis, CAD, chronic combined systolic and diastolic congestive heart failure, diabetes, hypertension, hyperlipidemia, arthritis presenting from his PCPs office for evaluation of rectal bleeding and hypotension.History provided by patient and wife at bedside. Since this afternoon he has had 3 episodes of large-volume bright red blood per rectum. No abdominal pain or hematemesis. He takes a baby aspirin daily and is not on any other blood thinners. He has received his first Covid vaccine and is due for the second dose next week. Denies fevers, cough, shortness of breath, or chest pain. Does report feeling dizzy.  ED Course:Blood pressure soft, remainder of vital signs stable. Labs showing no leukocytosis. Hemoglobin 11.8, was above 14 twomonths ago. FOBT positive.  CT angiogram abdomen/pelvis showing acute diverticulitis of the distal ascending colon with probable associated small diverticular bleed. Acute nondisplaced fracture of the greater trochanter of the right femur with extension into the intertrochanteric ridge. No dislocation.  Patient received ceftriaxone, metronidazole, and 1 L normal saline bolus.  Assessment & Plan:   Principal Problem:   GI bleed Active Problems:   Essential hypertension   Diverticulitis   Acute blood loss anemia   Femur fracture (HCC)   Lower GI bleed   #1 acute lower GI bleed likely secondary to diverticular bleed with significant drop in hemoglobin 11.8-8.9.  Check H&H every 6 and transfuse to keep his hemoglobin above 8. GI consult appreciated. Will DC antibiotics continue IV fluids start patient on clear liquids and monitor closely. CT Acute diverticulitis of the distal ascending colon with probable associated small diverticular bleed.  Acute  nondisplaced fracture of the greater trochanter of the right femur with extension into the intertrochanteric ridge. No dislocation. Cholelithiasis. Nonobstructing bilateral renal calculi. No hydronephrosis.  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 Correlation with urinalysis recommended to exclude cystitis.  Small cystic lesions adjacent to the head of the pancreas, incompletely characterized, possibly side branch IPMN or mucinous lesions. Further evaluation with nonemergent MRI recommended  #2 right femur fracture patient is able to move his right lower extremity without any difficulty.  PT recommends home health PT. CT-Acute nondisplaced fracture of the greater trochanter of the right femur with extension into the intertrochanteric ridge. No dislocation DW Dr Mardelle Matte Patient will follow up with Ortho as an outpatient.  Weightbearing as tolerated.  #3 type 2 diabetes continue SSI on Metformin continue to hold CBG (last 3)  Recent Labs    06/26/19 0327 06/26/19 0745 06/26/19 1132  GLUCAP 100* 126* 264*      #4 chronic combined systolic diastolic heart failure cautious IV hydration he appears to be stable at this time  #5 hypertension blood pressure trending up restart Coreg   #6 hypothyroidism on Synthroid Pressure Injury 09/02/16 Stage II -  Partial thickness loss of dermis presenting as a shallow open ulcer with a red, pink wound bed without slough. (Active)  09/02/16 2000  Location: Sacrum  Location Orientation: Medial  Staging: Stage II -  Partial thickness loss of dermis presenting as a shallow open ulcer with a red, pink wound bed without slough.  Wound Description (Comments):   Present on Admission: Yes    Estimated body mass index is 21.44 kg/m as calculated from the following:   Height as of this encounter: 5\' 8"  (1.727 m).   Weight as of this encounter:  64 kg.  DVT prophylaxis: SCD Code Status: Full  code Family Communication: Discussed with patient disposition Plan: Patient came from home Plan is to return home Barriers to discharge is GI bleed  Consultants:   GI  Procedures: None Antimicrobials: None    Subjective:  When I saw him earlier today he has not had a bowel movement later on patient had pink color stools Objective: Vitals:   06/25/19 1835 06/25/19 2242 06/26/19 0310 06/26/19 0635  BP: (!) 147/85 136/87 (!) 141/90 (!) 148/81  Pulse: 69 75 69 67  Resp: 20 20 16 18   Temp: 98.5 F (36.9 C) 98.7 F (37.1 C) 97.7 F (36.5 C) 97.9 F (36.6 C)  TempSrc: Oral  Oral Oral  SpO2: 98% 97% 94% 98%  Weight:      Height:        Intake/Output Summary (Last 24 hours) at 06/26/2019 1334 Last data filed at 06/25/2019 1618 Gross per 24 hour  Intake 94.09 ml  Output --  Net 94.09 ml   Filed Weights   06/24/19 1831  Weight: 64 kg    Examination:  General exam: Appears calm and comfortable  Respiratory system: Clear to auscultation. Respiratory effort normal. Cardiovascular system: S1 & S2 heard, RRR. No JVD, murmurs, rubs, gallops or clicks. No pedal edema. Gastrointestinal system: Abdomen is nondistended, soft and nontender. No organomegaly or masses felt. Normal bowel sounds heard. Central nervous system: Alert and oriented. No focal neurological deficits. Extremities: Symmetric 5 x 5 power. Skin: No rashes, lesions or ulcers Psychiatry: Judgement and insight appear normal. Mood & affect appropriate.     Data Reviewed: I have personally reviewed following labs and imaging studies  CBC: Recent Labs  Lab 06/24/19 1840 06/25/19 0446 06/25/19 1456 06/25/19 2103 06/26/19 0606  WBC 7.9  --   --   --  7.2  HGB 11.8* 8.9* 9.9* 10.7* 10.6*  HCT 35.2* 26.8* 30.3* 31.6* 31.7*  MCV 100.0  --   --   --  99.4  PLT 203  --   --   --  [id number]   Basic Metabolic Panel: Recent Labs  Lab 06/24/19 1840 06/26/19 0606  NA 139 140  K 4.3 4.0  CL 104 101  CO2 26 29   GLUCOSE 198* 129*  BUN 23 17  CREATININE 1.07 0.99  CALCIUM 9.3 9.6   GFR: Estimated Creatinine Clearance: 56.6 mL/min (by C-G formula based on SCr of 0.99 mg/dL). Liver Function Tests: Recent Labs  Lab 06/24/19 1840  AST 22  ALT 23  ALKPHOS 67  BILITOT 0.5  PROT 6.6  ALBUMIN 3.6   No results for input(s): LIPASE, AMYLASE in the last 168 hours. No results for input(s): AMMONIA in the last 168 hours. Coagulation Profile: No results for input(s): INR, PROTIME in the last 168 hours. Cardiac Enzymes: No results for input(s): CKTOTAL, CKMB, CKMBINDEX, TROPONINI in the last 168 hours. BNP (last 3 results) No results for input(s): PROBNP in the last 8760 hours. HbA1C: Recent Labs    06/25/19 0446  HGBA1C 6.3*   CBG: Recent Labs  Lab 06/25/19 1937 06/25/19 2336 06/26/19 0327 06/26/19 0745 06/26/19 1132  GLUCAP 98 150* 100* 126* 264*   Lipid Profile: No results for input(s): CHOL, HDL, LDLCALC, TRIG, CHOLHDL, LDLDIRECT in the last 72 hours. Thyroid Function Tests: No results for input(s): TSH, T4TOTAL, FREET4, T3FREE, THYROIDAB in the last 72 hours. Anemia Panel: No results for input(s): VITAMINB12, FOLATE, FERRITIN, TIBC, IRON, RETICCTPCT in the last 72 hours.  Sepsis Labs: No results for input(s): PROCALCITON, LATICACIDVEN in the last 168 hours.  Recent Results (from the past 240 hour(s))  SARS CORONAVIRUS 2 (TAT 6-24 HRS) Nasopharyngeal Nasopharyngeal Swab     Status: None   Collection Time: 06/24/19 10:21 PM   Specimen: Nasopharyngeal Swab  Result Value Ref Range Status   SARS Coronavirus 2 NEGATIVE NEGATIVE Final    Comment: (NOTE) SARS-CoV-2 target nucleic acids are NOT DETECTED. The SARS-CoV-2 RNA is generally detectable in upper and lower respiratory specimens during the acute phase of infection. Negative results do not preclude SARS-CoV-2 infection, do not rule out co-infections with other pathogens, and should not be used as the sole basis for  treatment or other patient management decisions. Negative results must be combined with clinical observations, patient history, and epidemiological information. The expected result is Negative. Fact Sheet for Patients: SugarRoll.be Fact Sheet for Healthcare Providers: https://www.woods-.com/ This test is not yet approved or cleared by the [place] FDA and  has been authorized for detection and/or diagnosis of SARS-CoV-2 by FDA under an Emergency Use Authorization (EUA). This EUA will remain  in effect (meaning this test can be used) for the duration of the COVID-19 declaration under Section 56 4(b)(1) of the Act, 21 U.S.C. section 360bbb-3(b)(1), unless the authorization is terminated or revoked sooner. Performed at [facility], [place] [address], [place], [place] [address]          Radiology Studies: CT Angio Abd/Pel W and/or Wo Contrast  Result Date: 06/24/2019 CLINICAL DATA:  79 year old male with GI bleed. Right red blood per rectum. EXAM: CTA ABDOMEN AND PELVIS WITHOUT AND WITH CONTRAST TECHNIQUE: Multidetector CT imaging of the abdomen and pelvis was performed using the standard protocol during bolus administration of intravenous contrast. Multiplanar reconstructed images and MIPs were obtained and reviewed to evaluate the vascular anatomy. CONTRAST:  125mL OMNIPAQUE IOHEXOL 350 MG/ML SOLN COMPARISON:  CT abdomen pelvis dated 02/04/2015. FINDINGS: VASCULAR Aorta: There is advanced atherosclerotic calcification. There is chronic occlusion and narrowing of the distal aorta and prior postsurgical changes. The aorta is patent. No aneurysmal dilatation or dissection. No periaortic inflammatory changes or fluid collection. Celiac: There is a common origin of the celiac artery and SMA. There is atherosclerotic calcification of the celiac artery. The celiac artery and its major branches are patent. There is a replaced left hepatic  artery from the left gastric artery variant anatomy. SMA: There is atherosclerotic calcification of the SMA. The SMA is patent. Renals: There is atherosclerotic calcification of the origins of the renal arteries. The renal arteries remain patent. IMA: The IMA is poorly visualized but appears patent. Inflow: Atherosclerotic calcification of the iliac arteries. The iliac arteries remain patent. No aneurysmal dilatation or dissection. Proximal Outflow: Bilateral common femoral and visualized portions of the superficial and profunda femoral arteries are patent without evidence of aneurysm, dissection, vasculitis or significant stenosis. Veins: No obvious venous abnormality within the limitations of this arterial phase study. Review of the MIP images confirms the above findings. NON-VASCULAR Lower chest: There is mild cardiomegaly with multi vessel coronary vascular calcification and postsurgical changes of CABG. The visualized lung bases are clear. No intra-abdominal free air or free fluid. Hepatobiliary: There is a 1 cm left hepatic cyst. Several additional subcentimeter hypodense foci are too small to characterize. No intrahepatic biliary ductal dilatation. Several stones noted in the gallbladder. No pericholecystic fluid or evidence of acute cholecystitis by CT. Pancreas: Small cystic lesions adjacent to the head of the pancreas measuring  up to 1.8 cm (series 5, image 60). This are suboptimally characterized but may represent side branch IPMN or mucinous lesions. There is no dilatation of the main pancreatic duct or gland atrophy. No active inflammatory changes. No peripancreatic fluid collection. Spleen: Normal in size without focal abnormality. Adrenals/Urinary Tract: The adrenal glands are unremarkable. Several nonobstructing bilateral renal calculi measure up to 10 mm in the inferior pole of the left kidney. There is no hydronephrosis on either side. Right renal inferior pole atrophy and scarring. There is a 3 cm  right renal inferior pole cyst as well as smaller exophytic cyst. The visualized ureters appear unremarkable. There is mild thickened and lobulated appearance of the bladder wall, likely related to chronic bladder outlet obstruction. There is mild perivesical haziness. Correlation with urinalysis recommended to exclude cystitis. Stomach/Bowel: There is diffuse colonic diverticulosis. There is mild pericolonic haziness and inflammatory changes centered at a diverticula in the distal ascending colon (series 11, image 65 consistent with acute diverticulitis. Faint contrast within the lumen of the distal ascending colon (series 5 image 85) suspicious for small diverticular bleed. There is no bowel obstruction. The appendix is normal and extends into the right hemipelvis. The tip of the appendix extends into a small right inguinal hernia. Lymphatic: No adenopathy. Reproductive: Enlarged prostate gland measuring approximately 6 cm in transverse axial diameter. The seminal vesicles are symmetric. Other: Prior hernia repair in the anterior pelvic wall. There is a small residual right inguinal hernia. Musculoskeletal: There is an acute nondisplaced fracture of the greater trochanter of the right femur with probable extension of the fracture into the intertrochanteric ridge. The bones are osteopenic. No dislocation. IMPRESSION: VASCULAR No aortic aneurysm or dissection. 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 NON-VASCULAR 1. Acute diverticulitis of the distal ascending colon with probable associated small diverticular bleed. 2. Acute nondisplaced fracture of the greater trochanter of the right femur with extension into the intertrochanteric ridge. No dislocation. 3. Cholelithiasis. 4. Nonobstructing bilateral renal calculi. No hydronephrosis. 5. 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 Correlation with urinalysis recommended to exclude cystitis. 6.  Small cystic lesions adjacent to the head of the pancreas, incompletely characterized, possibly side branch IPMN or mucinous lesions. Further evaluation with nonemergent MRI recommended. Electronically Signed   By: Anner Crete M.D.   On: 06/24/2019 21:52        Scheduled Meds: . sodium chloride   Intravenous Once  . atorvastatin  80 mg Oral q1800  . donepezil  10 mg Oral QHS  . escitalopram  10 mg Oral Daily  . insulin aspart  0-9 Units Subcutaneous Q4H  . levothyroxine  75 mcg Oral Q0600   Continuous Infusions:   LOS: 2 days     Georgette Shell, MD  06/26/2019, 1:34 PM

## 2019-06-26 NOTE — Progress Notes (Signed)
Patient's blood glucose reading was 57. Patient denies any symptoms of hypoglycemia. Sitting upright in bed alert and eating dinner. Spouse at bedside.

## 2019-06-26 NOTE — Discharge Summary (Signed)
Physician Discharge Summary  Miguel Vazquez [id number] DOB: 1940-07-10 DOA: 06/24/2019  PCP: Eulas Post, MD  Admit date: 06/24/2019 Discharge date: 06/26/2019  Admitted From: Home Disposition: Home Recommendations for Outpatient Follow-up:  1. Follow up with PCP in 1-2 weeks 2. Please obtain BMP/CBC in one week 3. Please follow up with Dr. Carlean Purl for CBC in 1 week 4. Follow-up with Dr. Marchia Bond in 4 weeks  Home Health: PT  equipment/Devices none  Discharge Condition: Stable and improved CODE STATUS: Full code Diet recommendation: Cardiac diet  brief/Interim Summary:78 y.o.malewith medical history significant ofdiverticulosis, CAD, chronic combined systolic and diastolic congestive heart failure, diabetes, hypertension, hyperlipidemia, arthritis presenting from his PCPs office for evaluation of rectal bleeding and hypotension.History provided by patient and wife at bedside. Since this afternoon he has had 3 episodes of large-volume bright red blood per rectum. No abdominal pain or hematemesis. He takes a baby aspirin daily and is not on any other blood thinners. He has received his first Covid vaccine and is due for the second dose next week. Denies fevers, cough, shortness of breath, or chest pain. Does report feeling dizzy.  ED Course:Blood pressure soft, remainder of vital signs stable. Labs showing no leukocytosis. Hemoglobin 11.8, was above 14 twomonths ago. FOBT positive.  CT angiogram abdomen/pelvis showing acute diverticulitis of the distal ascending colon with probable associated small diverticular bleed. Acute nondisplaced fracture of the greater trochanter of the right femur with extension into the intertrochanteric ridge. No dislocation.  Patient received ceftriaxone, metronidazole, and 1 L normal saline bolus.   Discharge Diagnoses:  Principal Problem:   GI bleed Active Problems:   Essential hypertension   Diverticulitis   Acute blood  loss anemia   Femur fracture (HCC)   Lower GI bleed  #1 acute lower GI bleed likely secondary to diverticular bleed with significant drop in hemoglobin 11.8-8.9.   Discussed with patient and wife. Seen by gi follow up with dr Carlean Purl  Hb stable 10.6 CT angiogram of the abdomen and pelvis with acute diverticulitis of the distal ascending colon with probable associated small diverticular bleed. Will DC antibiotics continue IV fluids start patient on clear liquids and monitor closely. CT Acute diverticulitis of the distal ascending colon with probable associated small diverticular bleed.  Acute nondisplaced fracture of the greater trochanter of the right femur with extension into the intertrochanteric ridge. No dislocation. Cholelithiasis. Nonobstructing bilateral renal calculi. No hydronephrosis.  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 Correlation with urinalysis recommended to exclude cystitis.  Small cystic lesions adjacent to the head of the pancreas, incompletely characterized, possibly side branch IPMN or mucinous lesions. Further evaluation with nonemergent MRI recommended  #2 right femur fracture patient is able to move his right lower extremity without any difficulty.  PT recommends home health PT. CT-Acute nondisplaced fracture of the greater trochanter of the right femur with extension into the intertrochanteric ridge. No dislocation DW Dr Mardelle Matte Patient will follow up with Ortho as an outpatient.  Weightbearing as tolerated.  #3 type 2 diabetes continue Metformin#4 chronic combined systolic diastolic heart failure cautious IV hydration he appears to be stable at this time  #5 hypertension blood pressure soft continue to hold blood pressure medications.  #6 hypothyroidism on Synthroid   Pressure Injury 09/02/16 Stage II -  Partial thickness loss of dermis presenting as a shallow open ulcer with a red, pink wound bed  without slough. (Active)  09/02/16 2000  Location: Sacrum  Location Orientation: Medial  Staging: Stage II -  Partial thickness loss of dermis presenting as a shallow open ulcer with a red, pink wound bed without slough.  Wound Description (Comments):   Present on Admission: Yes    Estimated body mass index is 21.44 kg/m as calculated from the following:   Height as of this encounter: 5\' 8"  (1.727 m).   Weight as of this encounter: 64 kg.  Discharge Instructions  Discharge Instructions    Call MD for:  difficulty breathing, headache or visual disturbances   Complete by: As directed    Call MD for:  persistant dizziness or light-headedness   Complete by: As directed    Call MD for:  persistant nausea and vomiting   Complete by: As directed    Call MD for:  severe uncontrolled pain   Complete by: As directed    Call MD for:  temperature >100.4   Complete by: As directed    Diet - low sodium heart healthy   Complete by: As directed    Increase activity slowly   Complete by: As directed      Allergies as of 06/26/2019      Reactions   Food Anaphylaxis   TREE NUTS   Shellfish Allergy Anaphylaxis   Prednisone    "psychosis"   Venomil Honey Bee Venom [honey Bee Venom] Swelling   Localized swelling      Medication List    TAKE these medications   aspirin EC 81 MG tablet Take 1 tablet (81 mg total) by mouth daily.   atorvastatin 80 MG tablet Commonly known as: LIPITOR TAKE 1 TABLET (80 MG TOTAL) BY MOUTH DAILY AT 6 PM.   carvedilol 6.25 MG tablet Commonly known as: COREG TAKE 1 TABLET BY MOUTH TWICE A DAY   donepezil 10 MG tablet Commonly known as: Aricept Take 1 tablet (10 mg total) by mouth at bedtime.   EPINEPHrine 0.3 mg/0.3 mL Soaj injection Commonly known as: EPI-PEN Inject 0.3 mLs (0.3 mg total) into the muscle as needed for anaphylaxis.   escitalopram 10 MG tablet Commonly known as: LEXAPRO Take 10 mg by mouth daily.   freestyle lancets Check Blood  Sugars 1-2 times per day. DX: E11.9   glucose blood test strip Commonly known as: FREESTYLE LITE Check Blood Sugars 1-2 times per day. DX: E11.9.   levothyroxine 75 MCG tablet Commonly known as: SYNTHROID TAKE 1 TABLET BY MOUTH EVERY DAY   losartan 100 MG tablet Commonly known as: COZAAR TAKE 1 TABLET BY MOUTH EVERY DAY   metFORMIN 1000 MG tablet Commonly known as: GLUCOPHAGE TAKE 1 TABLET BY MOUTH TWICE A DAY   multivitamin tablet Take 1 tablet by mouth daily. Centrium Silver once daily   vitamin B-12 1000 MCG tablet Commonly known as: CYANOCOBALAMIN Take 1,000 mcg by mouth daily.   vitamin C 500 MG tablet Commonly known as: ASCORBIC ACID Take 500 mg by mouth every evening.      Follow-up Information    [name], [name], MD Follow up.   Specialty: Family Medicine Contact information: [place] [address] [phone]        Gatha Mayer, MD Follow up.   Specialty: Gastroenterology Contact information: [address] [place] [address] [phone]        Marchia Bond, MD Follow up.   Specialty: Orthopedic Surgery Contact information: [address] [place] [address] [phone]          Allergies  Allergen  Reactions  . Food Anaphylaxis    TREE NUTS  . Shellfish Allergy Anaphylaxis  . Prednisone     "psychosis"  . Venomil Honey Bee Venom [Honey Bee Venom] Swelling    Localized swelling    Consultations:gi   Procedures/Studies: CT Angio Abd/Pel W and/or Wo Contrast  Result Date: 06/24/2019 CLINICAL DATA:  79 year old male with GI bleed. Right red blood per rectum. EXAM: CTA ABDOMEN AND PELVIS WITHOUT AND WITH CONTRAST TECHNIQUE: Multidetector CT imaging of the abdomen and pelvis was performed using the standard protocol during bolus administration of intravenous contrast. Multiplanar reconstructed images and MIPs were obtained and reviewed to evaluate the vascular anatomy. CONTRAST:   138mL OMNIPAQUE IOHEXOL 350 MG/ML SOLN COMPARISON:  CT abdomen pelvis dated 02/04/2015. FINDINGS: VASCULAR Aorta: There is advanced atherosclerotic calcification. There is chronic occlusion and narrowing of the distal aorta and prior postsurgical changes. The aorta is patent. No aneurysmal dilatation or dissection. No periaortic inflammatory changes or fluid collection. Celiac: There is a common origin of the celiac artery and SMA. There is atherosclerotic calcification of the celiac artery. The celiac artery and its major branches are patent. There is a replaced left hepatic artery from the left gastric artery variant anatomy. SMA: There is atherosclerotic calcification of the SMA. The SMA is patent. Renals: There is atherosclerotic calcification of the origins of the renal arteries. The renal arteries remain patent. IMA: The IMA is poorly visualized but appears patent. Inflow: Atherosclerotic calcification of the iliac arteries. The iliac arteries remain patent. No aneurysmal dilatation or dissection. Proximal Outflow: Bilateral common femoral and visualized portions of the superficial and profunda femoral arteries are patent without evidence of aneurysm, dissection, vasculitis or significant stenosis. Veins: No obvious venous abnormality within the limitations of this arterial phase study. Review of the MIP images confirms the above findings. NON-VASCULAR Lower chest: There is mild cardiomegaly with multi vessel coronary vascular calcification and postsurgical changes of CABG. The visualized lung bases are clear. No intra-abdominal free air or free fluid. Hepatobiliary: There is a 1 cm left hepatic cyst. Several additional subcentimeter hypodense foci are too small to characterize. No intrahepatic biliary ductal dilatation. Several stones noted in the gallbladder. No pericholecystic fluid or evidence of acute cholecystitis by CT. Pancreas: Small cystic lesions adjacent to the head of the pancreas measuring up to  1.8 cm (series 5, image 60). This are suboptimally characterized but may represent side branch IPMN or mucinous lesions. There is no dilatation of the main pancreatic duct or gland atrophy. No active inflammatory changes. No peripancreatic fluid collection. Spleen: Normal in size without focal abnormality. Adrenals/Urinary Tract: The adrenal glands are unremarkable. Several nonobstructing bilateral renal calculi measure up to 10 mm in the inferior pole of the left kidney. There is no hydronephrosis on either side. Right renal inferior pole atrophy and scarring. There is a 3 cm right renal inferior pole cyst as well as smaller exophytic cyst. The visualized ureters appear unremarkable. There is mild thickened and lobulated appearance of the bladder wall, likely related to chronic bladder outlet obstruction. There is mild perivesical haziness. Correlation with urinalysis recommended to exclude cystitis. Stomach/Bowel: There is diffuse colonic diverticulosis. There is mild pericolonic haziness and inflammatory changes centered at a diverticula in the distal ascending colon (series 11, image 65 consistent with acute diverticulitis. Faint contrast within the lumen of the distal ascending colon (series 5 image 85) suspicious for small diverticular bleed. There is no bowel obstruction. The appendix is normal and extends into the right hemipelvis.  The tip of the appendix extends into a small right inguinal hernia. Lymphatic: No adenopathy. Reproductive: Enlarged prostate gland measuring approximately 6 cm in transverse axial diameter. The seminal vesicles are symmetric. Other: Prior hernia repair in the anterior pelvic wall. There is a small residual right inguinal hernia. Musculoskeletal: There is an acute nondisplaced fracture of the greater trochanter of the right femur with probable extension of the fracture into the intertrochanteric ridge. The bones are osteopenic. No dislocation. IMPRESSION: VASCULAR No aortic  aneurysm or dissection. 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 NON-VASCULAR 1. Acute diverticulitis of the distal ascending colon with probable associated small diverticular bleed. 2. Acute nondisplaced fracture of the greater trochanter of the right femur with extension into the intertrochanteric ridge. No dislocation. 3. Cholelithiasis. 4. Nonobstructing bilateral renal calculi. No hydronephrosis. 5. 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 Correlation with urinalysis recommended to exclude cystitis. 6. Small cystic lesions adjacent to the head of the pancreas, incompletely characterized, possibly side branch IPMN or mucinous lesions. Further evaluation with nonemergent MRI recommended. Electronically Signed   By: Anner Crete M.D.   On: 06/24/2019 21:52    (Echo, Carotid, EGD, Colonoscopy, ERCP)    Subjective: Resting in bed anxious to go home  Discharge Exam: Vitals:   06/26/19 0310 06/26/19 0635  BP: (!) 141/90 (!) 148/81  Pulse: 69 67  Resp: 16 18  Temp: 97.7 F (36.5 C) 97.9 F (36.6 C)  SpO2: 94% 98%   Vitals:   06/25/19 1835 06/25/19 2242 06/26/19 0310 06/26/19 0635  BP: (!) 147/85 136/87 (!) 141/90 (!) 148/81  Pulse: 69 75 69 67  Resp: 20 20 16 18   Temp: 98.5 F (36.9 C) 98.7 F (37.1 C) 97.7 F (36.5 C) 97.9 F (36.6 C)  TempSrc: Oral  Oral Oral  SpO2: 98% 97% 94% 98%  Weight:      Height:        General: Pt is alert, awake, not in acute distress Cardiovascular: RRR, S1/S2 +, no rubs, no gallops Respiratory: CTA bilaterally, no wheezing, no rhonchi Abdominal: Soft, NT, ND, bowel sounds + Extremities: no edema, no cyanosis    The results of significant diagnostics from this hospitalization (including imaging, microbiology, ancillary and laboratory) are listed below for reference.     Microbiology: Recent Results (from the past 240 hour(s))  SARS CORONAVIRUS 2 (TAT 6-24 HRS)  Nasopharyngeal Nasopharyngeal Swab     Status: None   Collection Time: 06/24/19 10:21 PM   Specimen: Nasopharyngeal Swab  Result Value Ref Range Status   SARS Coronavirus 2 NEGATIVE NEGATIVE Final    Comment: (NOTE) SARS-CoV-2 target nucleic acids are NOT DETECTED. The SARS-CoV-2 RNA is generally detectable in upper and lower respiratory specimens during the acute phase of infection. Negative results do not preclude SARS-CoV-2 infection, do not rule out co-infections with other pathogens, and should not be used as the sole basis for treatment or other patient management decisions. Negative results must be combined with clinical observations, patient history, and epidemiological information. The expected result is Negative. Fact Sheet for Patients: SugarRoll.be Fact Sheet for Healthcare Providers: https://www.woods-.com/ This test is not yet approved or cleared by the [place] FDA and  has been authorized for detection and/or diagnosis of SARS-CoV-2 by FDA under an Emergency Use Authorization (EUA). This EUA will remain  in effect (meaning this test can be used) for the duration of the COVID-19 declaration under Section 56 4(b)(1) of the Act, 21  U.S.C. section 360bbb-3(b)(1), unless the authorization is terminated or revoked sooner. Performed at [facility], [place] [address], [place], [place] [address]      Labs: BNP (last 3 results) No results for input(s): BNP in the last 8760 hours. Basic Metabolic Panel: Recent Labs  Lab 06/24/19 1840 06/26/19 0606  NA 139 140  K 4.3 4.0  CL 104 101  CO2 26 29  GLUCOSE 198* 129*  BUN 23 17  CREATININE 1.07 0.99  CALCIUM 9.3 9.6   Liver Function Tests: Recent Labs  Lab 06/24/19 1840  AST 22  ALT 23  ALKPHOS 67  BILITOT 0.5  PROT 6.6  ALBUMIN 3.6   No results for input(s): LIPASE, AMYLASE in the last 168 hours. No results for input(s): AMMONIA in the last 168  hours. CBC: Recent Labs  Lab 06/24/19 1840 06/25/19 0446 06/25/19 1456 06/25/19 2103 06/26/19 0606  WBC 7.9  --   --   --  7.2  HGB 11.8* 8.9* 9.9* 10.7* 10.6*  HCT 35.2* 26.8* 30.3* 31.6* 31.7*  MCV 100.0  --   --   --  99.4  PLT 203  --   --   --  171   Cardiac Enzymes: No results for input(s): CKTOTAL, CKMB, CKMBINDEX, TROPONINI in the last 168 hours. BNP: Invalid input(s): POCBNP CBG: Recent Labs  Lab 06/25/19 1937 06/25/19 2336 06/26/19 0327 06/26/19 0745 06/26/19 1132  GLUCAP 98 150* 100* 126* 264*   D-Dimer No results for input(s): DDIMER in the last 72 hours. Hgb A1c Recent Labs    06/25/19 0446  HGBA1C 6.3*   Lipid Profile No results for input(s): CHOL, HDL, LDLCALC, TRIG, CHOLHDL, LDLDIRECT in the last 72 hours. Thyroid function studies No results for input(s): TSH, T4TOTAL, T3FREE, THYROIDAB in the last 72 hours.  Invalid input(s): FREET3 Anemia work up No results for input(s): VITAMINB12, FOLATE, FERRITIN, TIBC, IRON, RETICCTPCT in the last 72 hours. Urinalysis    Component Value Date/Time   COLORURINE YELLOW 03/06/2017 1745   APPEARANCEUR CLEAR 03/06/2017 1745   LABSPEC 1.017 03/06/2017 1745   PHURINE 7.0 03/06/2017 1745   GLUCOSEU NEGATIVE 03/06/2017 1745   HGBUR NEGATIVE 03/06/2017 1745   BILIRUBINUR NEGATIVE 03/06/2017 1745   BILIRUBINUR n 02/08/2017 1451   KETONESUR NEGATIVE 03/06/2017 1745   PROTEINUR 30 (A) 03/06/2017 1745   UROBILINOGEN 0.2 02/08/2017 1451   NITRITE NEGATIVE 03/06/2017 1745   LEUKOCYTESUR NEGATIVE 03/06/2017 1745   Sepsis Labs Invalid input(s): PROCALCITONIN,  WBC,  LACTICIDVEN Microbiology Recent Results (from the past 240 hour(s))  SARS CORONAVIRUS 2 (TAT 6-24 HRS) Nasopharyngeal Nasopharyngeal Swab     Status: None   Collection Time: 06/24/19 10:21 PM   Specimen: Nasopharyngeal Swab  Result Value Ref Range Status   SARS Coronavirus 2 NEGATIVE NEGATIVE Final    Comment: (NOTE) SARS-CoV-2 target nucleic  acids are NOT DETECTED. The SARS-CoV-2 RNA is generally detectable in upper and lower respiratory specimens during the acute phase of infection. Negative results do not preclude SARS-CoV-2 infection, do not rule out co-infections with other pathogens, and should not be used as the sole basis for treatment or other patient management decisions. Negative results must be combined with clinical observations, patient history, and epidemiological information. The expected result is Negative. Fact Sheet for Patients: SugarRoll.be Fact Sheet for Healthcare Providers: https://www.woods-.com/ This test is not yet approved or cleared by the [place] FDA and  has been authorized for detection and/or diagnosis of SARS-CoV-2 by FDA under an Emergency  Use Authorization (EUA). This EUA will remain  in effect (meaning this test can be used) for the duration of the COVID-19 declaration under Section 56 4(b)(1) of the Act, 21 U.S.C. section 360bbb-3(b)(1), unless the authorization is terminated or revoked sooner. Performed at [facility], [place] [address] [place], [place] [address]      Time coordinating discharge:  39 minutes  SIGNED:   Georgette Shell, MD  [facility] 06/26/2019, 1:47 PM Pager   If 7PM-7AM, please contact night-coverage [email or web address] Password [email or web address]

## 2019-06-26 NOTE — Progress Notes (Signed)
After meal blood glucose is 164. Patient remains alert and oriented. Pending discharged from hospital.

## 2019-06-26 NOTE — Evaluation (Signed)
Physical Therapy Evaluation Patient Details Name: Miguel Vazquez MRN: [id number] DOB: 12-Sep-1940 Today's Date: 06/26/2019   History of Present Illness  (P) Miguel Vazquez is a 79 y.o. male with medical history significant of diverticulosis, CAD, chronic combined systolic and diastolic congestive heart failure, diabetes, hypertension, hyperlipidemia, arthritis presenting from his PCPs office for evaluation of rectal bleeding and hypotension. Since this afternoon he has had 3 episodes of large-volume bright red blood per rectum.  No abdominal pain or hematemesis.  He takes a baby aspirin daily and is not on any other blood thinners.  He has received his first Covid vaccine and is due for the second dose next week.     Clinical Impression   Pt admitted with above diagnosis. Pt close to baseline, demonstrates some impulsiveness with transfers and ambulation. Pt with no near falls, but does demonstrate veering in both direction and narrow BOS. Pt denies pain with all mobility. Pt currently with functional limitations due to the deficits listed below (see PT Problem List). Pt will benefit from skilled PT to increase their independence and safety with mobility to allow discharge to the venue listed below.       Follow Up Recommendations Home health PT;Supervision - Intermittent    Equipment Recommendations  None recommended by PT    Recommendations for Other Services       Precautions / Restrictions Precautions Precautions: (P) Fall Precaution Comments: (P) R femur fx (~6 wks ago) Restrictions Weight Bearing Restrictions: (P) No Other Position/Activity Restrictions: (P) per patient has had two follow up visits with PCP and was told can bear weight on R LE      Mobility  Bed Mobility Overal bed mobility: (P) Modified Independent             General bed mobility comments: slightly increased time  Transfers Overall transfer level: (P) Needs assistance Equipment used: (P)  None Transfers: (P) Sit to/from Stand Sit to Stand: (P) Supervision;Min guard         General transfer comment: (P) mild impulsivity  Ambulation/Gait Ambulation/Gait assistance: Min guard Gait Distance (Feet): 180 Feet Assistive device: None Gait Pattern/deviations: Step-through pattern;Narrow base of support Gait velocity: WFL   General Gait Details: occasional veering R and L, sudden stops to talk to therapist and look around hallway, impulsive with direction changes and speed changes, no near falls or loss of balance  Stairs            Wheelchair Mobility    Modified Rankin (Stroke Patients Only)       Balance Overall balance assessment: (P) History of Falls;Needs assistance Sitting-balance support: Feet supported;No upper extremity supported Sitting balance-Leahy Scale: Good Sitting balance - Comments: seated EOB   Standing balance support: During functional activity;No upper extremity supported Standing balance-Leahy Scale: Fair Standing balance comment: no AD                Pertinent Vitals/Pain Pain Assessment: (P) No/denies pain    Home Living Family/patient expects to be discharged to:: (P) Private residence Living Arrangements: (P) Spouse/significant other Available Help at Discharge: (P) Family;Available 24 hours/day Type of Home: (P) House Home Access: (P) Ramped entrance Entrance Stairs-Rails: Right;Left;Can reach both   Home Layout: (P) Multi-level;Able to live on main level with bedroom/bathroom(4 floors) Home Equipment: (P) Shower seat - built in;Grab bars - toilet;Grab bars - tub/shower;Walker - 2 wheels;Wheelchair - manual      Prior Function Level of Independence: (P) Independent  Comments: (P) Pt reports independent with ADLs, ambulation without AD, 2 falls in last 6 months with R femur fracture.     Hand Dominance   Dominant Hand: (P) Right    Extremity/Trunk Assessment   Upper Extremity Assessment Upper  Extremity Assessment: (P) Overall WFL for tasks assessed    Lower Extremity Assessment Lower Extremity Assessment: (P) Defer to PT evaluation    Cervical / Trunk Assessment Cervical / Trunk Assessment: (P) Normal  Communication   Communication: (P) No difficulties  Cognition Arousal/Alertness: (P) Awake/alert Behavior During Therapy: (P) WFL for tasks assessed/performed Overall Cognitive Status: (P) No family/caregiver present to determine baseline cognitive functioning Area of Impairment: (P) Attention;Safety/judgement                   Current Attention Level: (P) Focused     Safety/Judgement: (P) Decreased awareness of safety     General Comments: (P) Patient is A/O x4 however is very distractable, tangential. Will stop walking and turn impulsively to talk to therapist, has difficulty with multi tasking (walking and continuing his story)       General Comments      Exercises     Assessment/Plan    PT Assessment Patient needs continued PT services  PT Problem List Decreased balance;Decreased cognition;Decreased knowledge of use of DME;Decreased safety awareness       PT Treatment Interventions DME instruction;Gait training;Stair training;Functional mobility training;Therapeutic activities;Therapeutic exercise;Balance training;Neuromuscular re-education;Patient/family education;Manual techniques    PT Goals (Current goals can be found in the Care Plan section)  Acute Rehab PT Goals Patient Stated Goal: go home PT Goal Formulation: With patient Time For Goal Achievement: 07/03/19 Potential to Achieve Goals: Good    Frequency Min 3X/week   Barriers to discharge        Co-evaluation PT/OT/SLP Co-Evaluation/Treatment: Yes Reason for Co-Treatment: (P) Necessary to address cognition/behavior during functional activity;To address functional/ADL transfers PT goals addressed during session: Mobility/safety with mobility;Balance;Strengthening/ROM OT goals  addressed during session: (P) ADL's and self-care       AM-PAC PT "6 Clicks" Mobility  Outcome Measure Help needed turning from your back to your side while in a flat bed without using bedrails?: None Help needed moving from lying on your back to sitting on the side of a flat bed without using bedrails?: None Help needed moving to and from a bed to a chair (including a wheelchair)?: None Help needed standing up from a chair using your arms (e.g., wheelchair or bedside chair)?: None Help needed to walk in hospital room?: A Little Help needed climbing 3-5 steps with a railing? : A Little 6 Click Score: 22    End of Session Equipment Utilized During Treatment: Gait belt Activity Tolerance: Patient tolerated treatment well Patient left: in bed;with call bell/phone within reach;with bed alarm set(returned to bed for nursing to provide external catheter) Nurse Communication: Mobility status PT Visit Diagnosis: Unsteadiness on feet (R26.81);History of falling (Z91.81)    Time: 1005-1030 PT Time Calculation (min) (ACUTE ONLY): 25 min   Charges:   PT Evaluation $PT Eval Low Complexity: 1 Low PT Treatments $Gait Training: 8-22 mins         Talbot Grumbling PT, DPT 06/26/19, 12:52 PM [phone]

## 2019-06-26 NOTE — Telephone Encounter (Signed)
-----   Message from Noralyn Pick, NP sent at 06/26/2019  1:53 PM EDT ----- Miguel Vazquez, patient is being discharged from the hospital today. Pls make arrangements for him to have a repeat CBC next week and follow up appointment with Dr. Carlean Purl in 2 to 3 weeks. THX

## 2019-06-26 NOTE — Progress Notes (Addendum)
[facility] Progress Note  CC:  Painless hematochezia   Subjective: He feels well today. He tolerated a regular breakfast. No abdominal pain. He passed a BM earlier this morning which consisted of a small amount of formed stool with loose brown stool and a bit of pinkish blood. His wife is at the bed side.    Objective:  Vital signs in last 24 hours: Temp:  [97.7 F (36.5 C)-98.7 F (37.1 C)] 97.9 F (36.6 C) (03/26 0635) Pulse Rate:  [65-75] 67 (03/26 0635) Resp:  [13-20] 18 (03/26 0635) BP: (135-148)/(81-90) 148/81 (03/26 0635) SpO2:  [94 %-100 %] 98 % (03/26 0635) Last BM Date: 06/24/19 General:   Alert 79 year old male in NAD.  Heart: RRR, 2/6 systolic murmur.  Pulm: Breath sounds clear throughout.  Abdomen: Soft, nontender, + BS x 4 quads.  Extremities:  Without edema. Neurologic:  Alert and  oriented x4;  grossly normal neurologically. Psych:  Alert and cooperative. Normal mood and affect.  Intake/Output from previous day: 03/25 0701 - 03/26 0700 In: 94.1 [IV Piggyback:94.1] Out: -  Intake/Output this shift: No intake/output data recorded.  Lab Results: Recent Labs    06/24/19 1840 06/25/19 0446 06/25/19 1456 06/25/19 2103 06/26/19 0606  WBC 7.9  --   --   --  7.2  HGB 11.8*   < > 9.9* 10.7* 10.6*  HCT 35.2*   < > 30.3* 31.6* 31.7*  PLT 203  --   --   --  171   < > = values in this interval not displayed.   BMET Recent Labs    06/24/19 1840 06/26/19 0606  NA 139 140  K 4.3 4.0  CL 104 101  CO2 26 29  GLUCOSE 198* 129*  BUN 23 17  CREATININE 1.07 0.99  CALCIUM 9.3 9.6   LFT Recent Labs    06/24/19 1840  PROT 6.6  ALBUMIN 3.6  AST 22  ALT 23  ALKPHOS 67  BILITOT 0.5   PT/INR No results for input(s): LABPROT, INR in the last 72 hours. Hepatitis Panel No results for input(s): HEPBSAG, HCVAB, HEPAIGM, HEPBIGM in the last 72 hours.  CT Angio Abd/Pel W and/or Wo Contrast  Result Date: 06/24/2019 CLINICAL DATA:   79 year old male with GI bleed. Right red blood per rectum. EXAM: CTA ABDOMEN AND PELVIS WITHOUT AND WITH CONTRAST TECHNIQUE: Multidetector CT imaging of the abdomen and pelvis was performed using the standard protocol during bolus administration of intravenous contrast. Multiplanar reconstructed images and MIPs were obtained and reviewed to evaluate the vascular anatomy. CONTRAST:  178mL OMNIPAQUE IOHEXOL 350 MG/ML SOLN COMPARISON:  CT abdomen pelvis dated 02/04/2015. FINDINGS: VASCULAR Aorta: There is advanced atherosclerotic calcification. There is chronic occlusion and narrowing of the distal aorta and prior postsurgical changes. The aorta is patent. No aneurysmal dilatation or dissection. No periaortic inflammatory changes or fluid collection. Celiac: There is a common origin of the celiac artery and SMA. There is atherosclerotic calcification of the celiac artery. The celiac artery and its major branches are patent. There is a replaced left hepatic artery from the left gastric artery variant anatomy. SMA: There is atherosclerotic calcification of the SMA. The SMA is patent. Renals: There is atherosclerotic calcification of the origins of the renal arteries. The renal arteries remain patent. IMA: The IMA is poorly visualized but appears patent. Inflow: Atherosclerotic calcification of the iliac arteries. The iliac arteries remain patent. No aneurysmal dilatation or dissection. Proximal Outflow: Bilateral common femoral and  visualized portions of the superficial and profunda femoral arteries are patent without evidence of aneurysm, dissection, vasculitis or significant stenosis. Veins: No obvious venous abnormality within the limitations of this arterial phase study. Review of the MIP images confirms the above findings. NON-VASCULAR Lower chest: There is mild cardiomegaly with multi vessel coronary vascular calcification and postsurgical changes of CABG. The visualized lung bases are clear. No intra-abdominal  free air or free fluid. Hepatobiliary: There is a 1 cm left hepatic cyst. Several additional subcentimeter hypodense foci are too small to characterize. No intrahepatic biliary ductal dilatation. Several stones noted in the gallbladder. No pericholecystic fluid or evidence of acute cholecystitis by CT. Pancreas: Small cystic lesions adjacent to the head of the pancreas measuring up to 1.8 cm (series 5, image 60). This are suboptimally characterized but may represent side branch IPMN or mucinous lesions. There is no dilatation of the main pancreatic duct or gland atrophy. No active inflammatory changes. No peripancreatic fluid collection. Spleen: Normal in size without focal abnormality. Adrenals/Urinary Tract: The adrenal glands are unremarkable. Several nonobstructing bilateral renal calculi measure up to 10 mm in the inferior pole of the left kidney. There is no hydronephrosis on either side. Right renal inferior pole atrophy and scarring. There is a 3 cm right renal inferior pole cyst as well as smaller exophytic cyst. The visualized ureters appear unremarkable. There is mild thickened and lobulated appearance of the bladder wall, likely related to chronic bladder outlet obstruction. There is mild perivesical haziness. Correlation with urinalysis recommended to exclude cystitis. Stomach/Bowel: There is diffuse colonic diverticulosis. There is mild pericolonic haziness and inflammatory changes centered at a diverticula in the distal ascending colon (series 11, image 65 consistent with acute diverticulitis. Faint contrast within the lumen of the distal ascending colon (series 5 image 85) suspicious for small diverticular bleed. There is no bowel obstruction. The appendix is normal and extends into the right hemipelvis. The tip of the appendix extends into a small right inguinal hernia. Lymphatic: No adenopathy. Reproductive: Enlarged prostate gland measuring approximately 6 cm in transverse axial diameter. The  seminal vesicles are symmetric. Other: Prior hernia repair in the anterior pelvic wall. There is a small residual right inguinal hernia. Musculoskeletal: There is an acute nondisplaced fracture of the greater trochanter of the right femur with probable extension of the fracture into the intertrochanteric ridge. The bones are osteopenic. No dislocation. IMPRESSION: VASCULAR No aortic aneurysm or dissection. 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 NON-VASCULAR 1. Acute diverticulitis of the distal ascending colon with probable associated small diverticular bleed. 2. Acute nondisplaced fracture of the greater trochanter of the right femur with extension into the intertrochanteric ridge. No dislocation. 3. Cholelithiasis. 4. Nonobstructing bilateral renal calculi. No hydronephrosis. 5. 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 Correlation with urinalysis recommended to exclude cystitis. 6. Small cystic lesions adjacent to the head of the pancreas, incompletely characterized, possibly side branch IPMN or mucinous lesions. Further evaluation with nonemergent MRI recommended. Electronically Signed   By: Anner Crete M.D.   On: 06/24/2019 21:52    Assessment / Plan:  47.  79 year old male with painless hematochezia, most likely diverticular bleed with acute anemia. Abdomen/pelvic CT angiogram 3/24 showed acute diverticulitis of the distal ascending colon with probable associated small diverticular bleed. In review of his CT, inflammation most likely due to recent diverticular bleed and not assessed to be acute diverticulitis.  Admission Hg 11.8 ->8.9 ->9.9 -> 10.7-> 10.6. BUN 17.  He passed a brown loose with solid bits of stool with a scant amount of pinkish blood this am.  -Repeat CBC in am -No plans for colonoscopy at at this time  -Most likely will be discharged home tomorrow   2. Anal nodule -Follow up as outpatient with Dr.  Carlean Purl   3.  Small cystic pancreatic lesions adjacent to the head of the pancreas identified by abd/pelvic CTA  4. Gallstones per abd/pevic CTA, asymptomatic   5.  History of CAD and CHF  6. DM II  7. Right femur fracture    Principal Problem:   GI bleed Active Problems:   Essential hypertension   Diverticulitis   Acute blood loss anemia   Femur fracture (HCC)   Lower GI bleed     LOS: 2 days   Miguel Vazquez  06/26/2019, 12:34 PM  GI ATTENDING  Interval history data reviewed.  Patient seen and examined.  Agree with comprehensive consultation note as outlined above.  Doing well.  Appears of had self-limited diverticular bleed.  Stable.  Could go home later today or tomorrow.  He knows to come to the hospital should he have recurrent bleeding.  He does not need colonoscopy.  Call for questions or problems.  We will sign off.  Docia Chuck. Geri Semin[place]., M.D. [facility] Division of Gastroenterology

## 2019-06-26 NOTE — Progress Notes (Signed)
Nsg Discharge Note  Admit Date:  06/24/2019 Discharge date: 06/26/2019   Miguel Vazquez to be D/C'd Home per MD order.  AVS completed.  Copy for chart, and copy for patient signed, and dated. Patient/caregiver able to verbalize understanding.  Discharge Medication: Allergies as of 06/26/2019      Reactions   Food Anaphylaxis   TREE NUTS   Shellfish Allergy Anaphylaxis   Prednisone    "psychosis"   Venomil Honey Bee Venom [honey Bee Venom] Swelling   Localized swelling      Medication List    TAKE these medications   aspirin EC 81 MG tablet Take 1 tablet (81 mg total) by mouth daily.   atorvastatin 80 MG tablet Commonly known as: LIPITOR TAKE 1 TABLET (80 MG TOTAL) BY MOUTH DAILY AT 6 PM.   carvedilol 6.25 MG tablet Commonly known as: COREG TAKE 1 TABLET BY MOUTH TWICE A DAY   donepezil 10 MG tablet Commonly known as: Aricept Take 1 tablet (10 mg total) by mouth at bedtime.   EPINEPHrine 0.3 mg/0.3 mL Soaj injection Commonly known as: EPI-PEN Inject 0.3 mLs (0.3 mg total) into the muscle as needed for anaphylaxis.   escitalopram 10 MG tablet Commonly known as: LEXAPRO Take 10 mg by mouth daily.   freestyle lancets Check Blood Sugars 1-2 times per day. DX: E11.9   glucose blood test strip Commonly known as: FREESTYLE LITE Check Blood Sugars 1-2 times per day. DX: E11.9.   levothyroxine 75 MCG tablet Commonly known as: SYNTHROID TAKE 1 TABLET BY MOUTH EVERY DAY   losartan 100 MG tablet Commonly known as: COZAAR TAKE 1 TABLET BY MOUTH EVERY DAY   metFORMIN 1000 MG tablet Commonly known as: GLUCOPHAGE TAKE 1 TABLET BY MOUTH TWICE A DAY   multivitamin tablet Take 1 tablet by mouth daily. Centrium Silver once daily   vitamin B-12 1000 MCG tablet Commonly known as: CYANOCOBALAMIN Take 1,000 mcg by mouth daily.   vitamin C 500 MG tablet Commonly known as: ASCORBIC ACID Take 500 mg by mouth every evening.       Discharge Assessment: Vitals:   06/26/19 0310 06/26/19 0635  BP: (!) 141/90 (!) 148/81  Pulse: 69 67  Resp: 16 18  Temp: 97.7 F (36.5 C) 97.9 F (36.6 C)  SpO2: 94% 98%   Skin clean, dry and intact without evidence of skin break down, no evidence of skin tears noted. IV catheter discontinued intact. Site without signs and symptoms of complications - no redness or edema noted at insertion site, patient denies c/o pain - only slight tenderness at site.  Dressing with slight pressure applied.  D/c Instructions-Education: Discharge instructions given to patient/family with verbalized understanding. D/c education completed with patient/family including follow up instructions, medication list, d/c activities limitations if indicated, with other d/c instructions as indicated by MD - patient able to verbalize understanding, all questions fully answered. Patient instructed to return to ED, call 911, or call MD for any changes in condition.  Patient escorted via [place], and D/C home via private auto.  Eustace Pen, RN 06/26/2019 5:16 PM

## 2019-06-26 NOTE — TOC Transition Note (Signed)
Transition of Care [facility]) - CM/SW Discharge Note   Patient Details  Name: Miguel Vazquez MRN: [id number] Date of Birth: [birth year]  Transition of [facility]) CM/SW Contact:  Lennart Pall, LCSW Phone Number: 06/26/2019, 3:38 PM   Clinical Narrative:   Pt cleared for d/c today. Met with pt and spouse who are aware of HHPT recommendations.  Pt agreeable and has no agency preference.  Referral place with [facility].  No further needs.    Final next level of care: [place] Barriers to Discharge: Barriers Resolved   Patient Goals and CMS Choice Patient states their goals for this hospitalization and ongoing recovery are:: go home! CMS Medicare.gov Compare Post Acute Care list provided to:: Patient Choice offered to / list presented to : Patient  Discharge Placement                       Discharge Plan and Services                          HH Arranged: PT [facility] Agency: [facility] Date [facility] Agency Contacted: 06/26/19 Time [place]: [id number] Representative spoke with at [place]: [place] ([place]) Interventions     Readmission Risk Interventions No flowsheet data found.

## 2019-06-26 NOTE — Progress Notes (Signed)
PROGRESS NOTE    Miguel Vazquez  [id number] DOB: 05-15-1940 DOA: 06/24/2019 PCP: Eulas Post, MD   Brief Narrative:78 y.o. male with medical history significant of diverticulosis, CAD, chronic combined systolic and diastolic congestive heart failure, diabetes, hypertension, hyperlipidemia, arthritis presenting from his PCPs office for evaluation of rectal bleeding and hypotension.  History provided by patient and wife at bedside.  Since this afternoon he has had 3 episodes of large-volume bright red blood per rectum.  No abdominal pain or hematemesis.  He takes a baby aspirin daily and is not on any other blood thinners.  He has received his first Covid vaccine and is due for the second dose next week.  Denies fevers, cough, shortness of breath, or chest pain.  Does report feeling dizzy.  ED Course: Blood pressure soft, remainder of vital signs stable.  Labs showing no leukocytosis.  Hemoglobin 11.8, was above 14 two months ago.  FOBT positive.   CT angiogram abdomen/pelvis showing acute diverticulitis of the distal ascending colon with probable associated small diverticular bleed.  Acute nondisplaced fracture of the greater trochanter of the right femur with extension into the intertrochanteric ridge.  No dislocation.  Patient received ceftriaxone, metronidazole, and 1 L normal saline bolus.  Assessment & Plan:   Principal Problem:   GI bleed Active Problems:   Essential hypertension   Diverticulitis   Acute blood loss anemia   Femur fracture (HCC)   Lower GI bleed  #1 acute lower GI bleed likely secondary to diverticular bleed with significant drop in hemoglobin 11.8-8.9.  Check H&H every 6 and transfuse to keep his hemoglobin above 8. GI consult appreciated. CT angiogram of the abdomen and pelvis with acute diverticulitis of the distal ascending colon with probable associated small diverticular bleed. Will DC antibiotics continue IV fluids start patient on clear liquids and  monitor closely. CT Acute diverticulitis of the distal ascending colon with probable associated small diverticular bleed.  Acute nondisplaced fracture of the greater trochanter of the right femur with extension into the intertrochanteric ridge. No dislocation. Cholelithiasis. Nonobstructing bilateral renal calculi. No hydronephrosis.  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 Correlation with urinalysis recommended to exclude cystitis.  Small cystic lesions adjacent to the head of the pancreas, incompletely characterized, possibly side branch IPMN or mucinous lesions. Further evaluation with nonemergent MRI recommended  #2 right femur fracture patient is able to move his right lower extremity without any difficulty.  PT recommends home health PT. CT-Acute nondisplaced fracture of the greater trochanter of the right femur with extension into the intertrochanteric ridge. No dislocation DW Dr Mardelle Matte Patient will follow up with Ortho as an outpatient.  Weightbearing as tolerated.  #3 type 2 diabetes continue SSI  #4 chronic combined systolic diastolic heart failure cautious IV hydration he appears to be stable at this time  #5 hypertension blood pressure soft continue to hold blood pressure medications.  #6 hypothyroidism on Synthroid   Pressure Injury 09/02/16 Stage II -  Partial thickness loss of dermis presenting as a shallow open ulcer with a red, pink wound bed without slough. (Active)  09/02/16 2000  Location: Sacrum  Location Orientation: Medial  Staging: Stage II -  Partial thickness loss of dermis presenting as a shallow open ulcer with a red, pink wound bed without slough.  Wound Description (Comments):   Present on Admission: Yes     Estimated body mass index is 21.44 kg/m as calculated from the following:   Height as  of this encounter: 5\' 8"  (1.727 m).   Weight as of this encounter: 64 kg.  DVT prophylaxis: SCD Code  Status: Full code Family Communication: Discussed with patient disposition Plan: Patient came from home Plan is to return home Barriers to discharge is GI bleed  Consultants:   GI  Procedures: None Antimicrobials: None Subjective: He is resting in bed he denies having any bowel movement since coming to the hospital.  Objective: Vitals:   06/25/19 1835 06/25/19 2242 06/26/19 0310 06/26/19 0635  BP: (!) 147/85 136/87 (!) 141/90 (!) 148/81  Pulse: 69 75 69 67  Resp: 20 20 16 18   Temp: 98.5 F (36.9 C) 98.7 F (37.1 C) 97.7 F (36.5 C) 97.9 F (36.6 C)  TempSrc: Oral  Oral Oral  SpO2: 98% 97% 94% 98%  Weight:      Height:        Intake/Output Summary (Last 24 hours) at 06/26/2019 1319 Last data filed at 06/25/2019 1618 Gross per 24 hour  Intake 94.09 ml  Output --  Net 94.09 ml   Filed Weights   06/24/19 1831  Weight: 64 kg    Examination:  General exam: Appears calm and comfortable  Respiratory system: Clear to auscultation. Respiratory effort normal. Cardiovascular system: S1 & S2 heard, RRR. No JVD, murmurs, rubs, gallops or clicks. No pedal edema. Gastrointestinal system: Abdomen is nondistended, soft and nontender. No organomegaly or masses felt. Normal bowel sounds heard. Central nervous system: Alert and oriented. No focal neurological deficits. Extremities: Symmetric 5 x 5 power. Skin: No rashes, lesions or ulcers Psychiatry: Judgement and insight appear normal. Mood & affect appropriate.     Data Reviewed: I have personally reviewed following labs and imaging studies  CBC: Recent Labs  Lab 06/24/19 1840 06/25/19 0446 06/25/19 1456 06/25/19 2103 06/26/19 0606  WBC 7.9  --   --   --  7.2  HGB 11.8* 8.9* 9.9* 10.7* 10.6*  HCT 35.2* 26.8* 30.3* 31.6* 31.7*  MCV 100.0  --   --   --  99.4  PLT 203  --   --   --  [id number]   Basic Metabolic Panel: Recent Labs  Lab 06/24/19 1840 06/26/19 0606  NA 139 140  K 4.3 4.0  CL 104 101  CO2 26 29  GLUCOSE  198* 129*  BUN 23 17  CREATININE 1.07 0.99  CALCIUM 9.3 9.6   GFR: Estimated Creatinine Clearance: 56.6 mL/min (by C-G formula based on SCr of 0.99 mg/dL). Liver Function Tests: Recent Labs  Lab 06/24/19 1840  AST 22  ALT 23  ALKPHOS 67  BILITOT 0.5  PROT 6.6  ALBUMIN 3.6   No results for input(s): LIPASE, AMYLASE in the last 168 hours. No results for input(s): AMMONIA in the last 168 hours. Coagulation Profile: No results for input(s): INR, PROTIME in the last 168 hours. Cardiac Enzymes: No results for input(s): CKTOTAL, CKMB, CKMBINDEX, TROPONINI in the last 168 hours. BNP (last 3 results) No results for input(s): PROBNP in the last 8760 hours. HbA1C: Recent Labs    06/25/19 0446  HGBA1C 6.3*   CBG: Recent Labs  Lab 06/25/19 1937 06/25/19 2336 06/26/19 0327 06/26/19 0745 06/26/19 1132  GLUCAP 98 150* 100* 126* 264*   Lipid Profile: No results for input(s): CHOL, HDL, LDLCALC, TRIG, CHOLHDL, LDLDIRECT in the last 72 hours. Thyroid Function Tests: No results for input(s): TSH, T4TOTAL, FREET4, T3FREE, THYROIDAB in the last 72 hours. Anemia Panel: No results for input(s): VITAMINB12, FOLATE, FERRITIN, TIBC,  IRON, RETICCTPCT in the last 72 hours. Sepsis Labs: No results for input(s): PROCALCITON, LATICACIDVEN in the last 168 hours.  Recent Results (from the past 240 hour(s))  SARS CORONAVIRUS 2 (TAT 6-24 HRS) Nasopharyngeal Nasopharyngeal Swab     Status: None   Collection Time: 06/24/19 10:21 PM   Specimen: Nasopharyngeal Swab  Result Value Ref Range Status   SARS Coronavirus 2 NEGATIVE NEGATIVE Final    Comment: (NOTE) SARS-CoV-2 target nucleic acids are NOT DETECTED. The SARS-CoV-2 RNA is generally detectable in upper and lower respiratory specimens during the acute phase of infection. Negative results do not preclude SARS-CoV-2 infection, do not rule out co-infections with other pathogens, and should not be used as the sole basis for treatment or other  patient management decisions. Negative results must be combined with clinical observations, patient history, and epidemiological information. The expected result is Negative. Fact Sheet for Patients: SugarRoll.be Fact Sheet for Healthcare Providers: https://www.woods-.com/ This test is not yet approved or cleared by the [place] FDA and  has been authorized for detection and/or diagnosis of SARS-CoV-2 by FDA under an Emergency Use Authorization (EUA). This EUA will remain  in effect (meaning this test can be used) for the duration of the COVID-19 declaration under Section 56 4(b)(1) of the Act, 21 U.S.C. section 360bbb-3(b)(1), unless the authorization is terminated or revoked sooner. Performed at [facility], [place] [address], [place], [place] [address]          Radiology Studies: CT Angio Abd/Pel W and/or Wo Contrast  Result Date: 06/24/2019 CLINICAL DATA:  79 year old male with GI bleed. Right red blood per rectum. EXAM: CTA ABDOMEN AND PELVIS WITHOUT AND WITH CONTRAST TECHNIQUE: Multidetector CT imaging of the abdomen and pelvis was performed using the standard protocol during bolus administration of intravenous contrast. Multiplanar reconstructed images and MIPs were obtained and reviewed to evaluate the vascular anatomy. CONTRAST:  165mL OMNIPAQUE IOHEXOL 350 MG/ML SOLN COMPARISON:  CT abdomen pelvis dated 02/04/2015. FINDINGS: VASCULAR Aorta: There is advanced atherosclerotic calcification. There is chronic occlusion and narrowing of the distal aorta and prior postsurgical changes. The aorta is patent. No aneurysmal dilatation or dissection. No periaortic inflammatory changes or fluid collection. Celiac: There is a common origin of the celiac artery and SMA. There is atherosclerotic calcification of the celiac artery. The celiac artery and its major branches are patent. There is a replaced left hepatic artery from the left  gastric artery variant anatomy. SMA: There is atherosclerotic calcification of the SMA. The SMA is patent. Renals: There is atherosclerotic calcification of the origins of the renal arteries. The renal arteries remain patent. IMA: The IMA is poorly visualized but appears patent. Inflow: Atherosclerotic calcification of the iliac arteries. The iliac arteries remain patent. No aneurysmal dilatation or dissection. Proximal Outflow: Bilateral common femoral and visualized portions of the superficial and profunda femoral arteries are patent without evidence of aneurysm, dissection, vasculitis or significant stenosis. Veins: No obvious venous abnormality within the limitations of this arterial phase study. Review of the MIP images confirms the above findings. NON-VASCULAR Lower chest: There is mild cardiomegaly with multi vessel coronary vascular calcification and postsurgical changes of CABG. The visualized lung bases are clear. No intra-abdominal free air or free fluid. Hepatobiliary: There is a 1 cm left hepatic cyst. Several additional subcentimeter hypodense foci are too small to characterize. No intrahepatic biliary ductal dilatation. Several stones noted in the gallbladder. No pericholecystic fluid or evidence of acute cholecystitis by CT. Pancreas: Small cystic lesions adjacent  to the head of the pancreas measuring up to 1.8 cm (series 5, image 60). This are suboptimally characterized but may represent side branch IPMN or mucinous lesions. There is no dilatation of the main pancreatic duct or gland atrophy. No active inflammatory changes. No peripancreatic fluid collection. Spleen: Normal in size without focal abnormality. Adrenals/Urinary Tract: The adrenal glands are unremarkable. Several nonobstructing bilateral renal calculi measure up to 10 mm in the inferior pole of the left kidney. There is no hydronephrosis on either side. Right renal inferior pole atrophy and scarring. There is a 3 cm right renal inferior  pole cyst as well as smaller exophytic cyst. The visualized ureters appear unremarkable. There is mild thickened and lobulated appearance of the bladder wall, likely related to chronic bladder outlet obstruction. There is mild perivesical haziness. Correlation with urinalysis recommended to exclude cystitis. Stomach/Bowel: There is diffuse colonic diverticulosis. There is mild pericolonic haziness and inflammatory changes centered at a diverticula in the distal ascending colon (series 11, image 65 consistent with acute diverticulitis. Faint contrast within the lumen of the distal ascending colon (series 5 image 85) suspicious for small diverticular bleed. There is no bowel obstruction. The appendix is normal and extends into the right hemipelvis. The tip of the appendix extends into a small right inguinal hernia. Lymphatic: No adenopathy. Reproductive: Enlarged prostate gland measuring approximately 6 cm in transverse axial diameter. The seminal vesicles are symmetric. Other: Prior hernia repair in the anterior pelvic wall. There is a small residual right inguinal hernia. Musculoskeletal: There is an acute nondisplaced fracture of the greater trochanter of the right femur with probable extension of the fracture into the intertrochanteric ridge. The bones are osteopenic. No dislocation. IMPRESSION: VASCULAR No aortic aneurysm or dissection. 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 NON-VASCULAR 1. Acute diverticulitis of the distal ascending colon with probable associated small diverticular bleed. 2. Acute nondisplaced fracture of the greater trochanter of the right femur with extension into the intertrochanteric ridge. No dislocation. 3. Cholelithiasis. 4. Nonobstructing bilateral renal calculi. No hydronephrosis. 5. 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 Correlation with urinalysis recommended to exclude cystitis. 6. Small cystic lesions  adjacent to the head of the pancreas, incompletely characterized, possibly side branch IPMN or mucinous lesions. Further evaluation with nonemergent MRI recommended. Electronically Signed   By: Anner Crete M.D.   On: 06/24/2019 21:52        Scheduled Meds: . sodium chloride   Intravenous Once  . atorvastatin  80 mg Oral q1800  . donepezil  10 mg Oral QHS  . escitalopram  10 mg Oral Daily  . insulin aspart  0-9 Units Subcutaneous Q4H  . levothyroxine  75 mcg Oral Q0600   Continuous Infusions:   LOS: 2 days     Georgette Shell, MD  06/26/2019, 1:19 PM

## 2019-06-28 LAB — TYPE AND SCREEN
ABO/RH(D): AB POS
Antibody Screen: NEGATIVE
Unit division: 0
Unit division: 0

## 2019-06-28 LAB — BPAM RBC
Blood Product Expiration Date: 202104162359
Blood Product Expiration Date: 202104172359
Unit Type and Rh: 6200
Unit Type and Rh: 6200

## 2019-06-29 ENCOUNTER — Telehealth: Payer: Self-pay | Admitting: *Deleted

## 2019-06-29 DIAGNOSIS — K219 Gastro-esophageal reflux disease without esophagitis: Secondary | ICD-10-CM | POA: Diagnosis not present

## 2019-06-29 DIAGNOSIS — S72114D Nondisplaced fracture of greater trochanter of right femur, subsequent encounter for closed fracture with routine healing: Secondary | ICD-10-CM | POA: Diagnosis not present

## 2019-06-29 DIAGNOSIS — K5730 Diverticulosis of large intestine without perforation or abscess without bleeding: Secondary | ICD-10-CM | POA: Diagnosis not present

## 2019-06-29 DIAGNOSIS — I252 Old myocardial infarction: Secondary | ICD-10-CM | POA: Diagnosis not present

## 2019-06-29 DIAGNOSIS — I5042 Chronic combined systolic (congestive) and diastolic (congestive) heart failure: Secondary | ICD-10-CM | POA: Diagnosis not present

## 2019-06-29 DIAGNOSIS — I2510 Atherosclerotic heart disease of native coronary artery without angina pectoris: Secondary | ICD-10-CM | POA: Diagnosis not present

## 2019-06-29 DIAGNOSIS — I130 Hypertensive heart and chronic kidney disease with heart failure and stage 1 through stage 4 chronic kidney disease, or unspecified chronic kidney disease: Secondary | ICD-10-CM | POA: Diagnosis not present

## 2019-06-29 DIAGNOSIS — Z9181 History of falling: Secondary | ICD-10-CM | POA: Diagnosis not present

## 2019-06-29 DIAGNOSIS — Z951 Presence of aortocoronary bypass graft: Secondary | ICD-10-CM | POA: Diagnosis not present

## 2019-06-29 DIAGNOSIS — E039 Hypothyroidism, unspecified: Secondary | ICD-10-CM | POA: Diagnosis not present

## 2019-06-29 DIAGNOSIS — I959 Hypotension, unspecified: Secondary | ICD-10-CM | POA: Diagnosis not present

## 2019-06-29 DIAGNOSIS — D631 Anemia in chronic kidney disease: Secondary | ICD-10-CM | POA: Diagnosis not present

## 2019-06-29 DIAGNOSIS — N189 Chronic kidney disease, unspecified: Secondary | ICD-10-CM | POA: Diagnosis not present

## 2019-06-29 DIAGNOSIS — Z7984 Long term (current) use of oral hypoglycemic drugs: Secondary | ICD-10-CM | POA: Diagnosis not present

## 2019-06-29 DIAGNOSIS — E1122 Type 2 diabetes mellitus with diabetic chronic kidney disease: Secondary | ICD-10-CM | POA: Diagnosis not present

## 2019-06-29 DIAGNOSIS — M1990 Unspecified osteoarthritis, unspecified site: Secondary | ICD-10-CM | POA: Diagnosis not present

## 2019-06-29 DIAGNOSIS — E785 Hyperlipidemia, unspecified: Secondary | ICD-10-CM | POA: Diagnosis not present

## 2019-06-29 DIAGNOSIS — Z86010 Personal history of colonic polyps: Secondary | ICD-10-CM | POA: Diagnosis not present

## 2019-06-29 NOTE — Telephone Encounter (Signed)
Attempted to contact patient on home and cell phone. Unable to leave a message.

## 2019-06-29 NOTE — Telephone Encounter (Signed)
Called and spoke with patient-patient informed of provider's recommendations; patient is agreeable with plan of care and has been scheduled for a follow up OV on 07/20/2019 at 2:50 pm; Patient verbalized understanding of information/instructions;  Patient was advised to call the office at [phone] if questions/concerns arise;

## 2019-06-30 ENCOUNTER — Ambulatory Visit: Payer: PPO | Attending: Internal Medicine

## 2019-06-30 ENCOUNTER — Telehealth: Payer: Self-pay | Admitting: Family Medicine

## 2019-06-30 DIAGNOSIS — Z23 Encounter for immunization: Secondary | ICD-10-CM

## 2019-06-30 NOTE — Telephone Encounter (Signed)
Per Dr.[name] okay to get covid shot pt wife has been told

## 2019-06-30 NOTE — Telephone Encounter (Signed)
Pt was in the hospital on Thursday, Friday of last week; he wants to know if he should receive his second covid vaccine, which he is scheduled for today at 1:30 pm; please advise. Per wife joyce [phone]

## 2019-06-30 NOTE — Progress Notes (Signed)
   Covid-19 Vaccination Clinic  Name:  Miguel Vazquez    MRN: [id number] DOB: 1940/04/14  06/30/2019  [name] was observed post Covid-19 immunization for 15 minutes without incident. He was provided with Vaccine Information Sheet and instruction to access the V-Safe system.   [name] was instructed to call 911 with any severe reactions post vaccine: Marland Kitchen Difficulty breathing  . Swelling of face and throat  . A fast heartbeat  . A bad rash all over body  . Dizziness and weakness   Immunizations Administered    Name Date Dose VIS Date Route   Pfizer COVID-19 Vaccine 06/30/2019  1:32 PM 0.3 mL 03/13/2019 Intramuscular   Manufacturer: [place]   Lot: [id number]   [place]: [id number]

## 2019-07-07 ENCOUNTER — Other Ambulatory Visit (INDEPENDENT_AMBULATORY_CARE_PROVIDER_SITE_OTHER): Payer: PPO

## 2019-07-07 DIAGNOSIS — K5792 Diverticulitis of intestine, part unspecified, without perforation or abscess without bleeding: Secondary | ICD-10-CM

## 2019-07-07 DIAGNOSIS — K922 Gastrointestinal hemorrhage, unspecified: Secondary | ICD-10-CM

## 2019-07-07 LAB — CBC WITH DIFFERENTIAL/PLATELET
Basophils Absolute: 0.1 10*3/uL (ref 0.0–0.1)
Basophils Relative: 1.2 % (ref 0.0–3.0)
Eosinophils Absolute: 0.4 10*3/uL (ref 0.0–0.7)
Eosinophils Relative: 4.6 % (ref 0.0–5.0)
HCT: 24.6 % — ABNORMAL LOW (ref 39.0–52.0)
Hemoglobin: 8.5 g/dL — ABNORMAL LOW (ref 13.0–17.0)
Lymphocytes Relative: 15 % (ref 12.0–46.0)
Lymphs Abs: 1.2 10*3/uL (ref 0.7–4.0)
MCHC: 34.7 g/dL (ref 30.0–36.0)
MCV: 97.4 fl (ref 78.0–100.0)
Monocytes Absolute: 0.5 10*3/uL (ref 0.1–1.0)
Monocytes Relative: 6.5 % (ref 3.0–12.0)
Neutro Abs: 5.9 10*3/uL (ref 1.4–7.7)
Neutrophils Relative %: 72.7 % (ref 43.0–77.0)
Platelets: 199 10*3/uL (ref 150.0–400.0)
RBC: 2.53 Mil/uL — ABNORMAL LOW (ref 4.22–5.81)
RDW: 14.9 % (ref 11.5–15.5)
WBC: 8 10*3/uL (ref 4.0–10.5)

## 2019-07-08 DIAGNOSIS — I5042 Chronic combined systolic (congestive) and diastolic (congestive) heart failure: Secondary | ICD-10-CM | POA: Diagnosis not present

## 2019-07-08 DIAGNOSIS — E785 Hyperlipidemia, unspecified: Secondary | ICD-10-CM | POA: Diagnosis not present

## 2019-07-08 DIAGNOSIS — Z7984 Long term (current) use of oral hypoglycemic drugs: Secondary | ICD-10-CM | POA: Diagnosis not present

## 2019-07-08 DIAGNOSIS — K5730 Diverticulosis of large intestine without perforation or abscess without bleeding: Secondary | ICD-10-CM | POA: Diagnosis not present

## 2019-07-08 DIAGNOSIS — Z86010 Personal history of colonic polyps: Secondary | ICD-10-CM | POA: Diagnosis not present

## 2019-07-08 DIAGNOSIS — K219 Gastro-esophageal reflux disease without esophagitis: Secondary | ICD-10-CM | POA: Diagnosis not present

## 2019-07-08 DIAGNOSIS — I252 Old myocardial infarction: Secondary | ICD-10-CM | POA: Diagnosis not present

## 2019-07-08 DIAGNOSIS — I959 Hypotension, unspecified: Secondary | ICD-10-CM | POA: Diagnosis not present

## 2019-07-08 DIAGNOSIS — I130 Hypertensive heart and chronic kidney disease with heart failure and stage 1 through stage 4 chronic kidney disease, or unspecified chronic kidney disease: Secondary | ICD-10-CM | POA: Diagnosis not present

## 2019-07-08 DIAGNOSIS — Z951 Presence of aortocoronary bypass graft: Secondary | ICD-10-CM | POA: Diagnosis not present

## 2019-07-08 DIAGNOSIS — E1122 Type 2 diabetes mellitus with diabetic chronic kidney disease: Secondary | ICD-10-CM | POA: Diagnosis not present

## 2019-07-08 DIAGNOSIS — M1990 Unspecified osteoarthritis, unspecified site: Secondary | ICD-10-CM | POA: Diagnosis not present

## 2019-07-08 DIAGNOSIS — S72114D Nondisplaced fracture of greater trochanter of right femur, subsequent encounter for closed fracture with routine healing: Secondary | ICD-10-CM | POA: Diagnosis not present

## 2019-07-08 DIAGNOSIS — Z9181 History of falling: Secondary | ICD-10-CM | POA: Diagnosis not present

## 2019-07-08 DIAGNOSIS — D631 Anemia in chronic kidney disease: Secondary | ICD-10-CM | POA: Diagnosis not present

## 2019-07-08 DIAGNOSIS — E039 Hypothyroidism, unspecified: Secondary | ICD-10-CM | POA: Diagnosis not present

## 2019-07-08 DIAGNOSIS — I2510 Atherosclerotic heart disease of native coronary artery without angina pectoris: Secondary | ICD-10-CM | POA: Diagnosis not present

## 2019-07-08 DIAGNOSIS — N189 Chronic kidney disease, unspecified: Secondary | ICD-10-CM | POA: Diagnosis not present

## 2019-07-09 ENCOUNTER — Ambulatory Visit: Payer: PPO | Admitting: Nurse Practitioner

## 2019-07-10 ENCOUNTER — Other Ambulatory Visit (INDEPENDENT_AMBULATORY_CARE_PROVIDER_SITE_OTHER): Payer: PPO

## 2019-07-10 ENCOUNTER — Encounter: Payer: Self-pay | Admitting: Internal Medicine

## 2019-07-10 ENCOUNTER — Ambulatory Visit (INDEPENDENT_AMBULATORY_CARE_PROVIDER_SITE_OTHER): Payer: PPO | Admitting: Internal Medicine

## 2019-07-10 VITALS — BP 110/60 | HR 72 | Temp 98.2°F | Ht 68.0 in | Wt 151.0 lb

## 2019-07-10 DIAGNOSIS — K5731 Diverticulosis of large intestine without perforation or abscess with bleeding: Secondary | ICD-10-CM

## 2019-07-10 DIAGNOSIS — K869 Disease of pancreas, unspecified: Secondary | ICD-10-CM

## 2019-07-10 DIAGNOSIS — D62 Acute posthemorrhagic anemia: Secondary | ICD-10-CM

## 2019-07-10 DIAGNOSIS — S72114A Nondisplaced fracture of greater trochanter of right femur, initial encounter for closed fracture: Secondary | ICD-10-CM | POA: Diagnosis not present

## 2019-07-10 LAB — CBC WITH DIFFERENTIAL/PLATELET
Basophils Absolute: 0.1 10*3/uL (ref 0.0–0.1)
Basophils Relative: 1.2 % (ref 0.0–3.0)
Eosinophils Absolute: 0.4 10*3/uL (ref 0.0–0.7)
Eosinophils Relative: 4.6 % (ref 0.0–5.0)
HCT: 25 % — ABNORMAL LOW (ref 39.0–52.0)
Hemoglobin: 8.6 g/dL — ABNORMAL LOW (ref 13.0–17.0)
Lymphocytes Relative: 12.4 % (ref 12.0–46.0)
Lymphs Abs: 1.1 10*3/uL (ref 0.7–4.0)
MCHC: 34.5 g/dL (ref 30.0–36.0)
MCV: 97.8 fl (ref 78.0–100.0)
Monocytes Absolute: 0.5 10*3/uL (ref 0.1–1.0)
Monocytes Relative: 5.8 % (ref 3.0–12.0)
Neutro Abs: 6.6 10*3/uL (ref 1.4–7.7)
Neutrophils Relative %: 76 % (ref 43.0–77.0)
Platelets: 202 10*3/uL (ref 150.0–400.0)
RBC: 2.56 Mil/uL — ABNORMAL LOW (ref 4.22–5.81)
RDW: 15 % (ref 11.5–15.5)
WBC: 8.7 10*3/uL (ref 4.0–10.5)

## 2019-07-10 LAB — FERRITIN: Ferritin: 15 ng/mL — ABNORMAL LOW (ref 22.0–322.0)
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
## 2019-07-10 NOTE — Patient Instructions (Signed)
Your provider has requested that you go to the basement level for lab work before leaving today. Press "B" on the elevator. The lab is located at the first door on the left as you exit the elevator.   You have been scheduled for an [facility] on 07/23/2019. Your appointment time is 8:00AM. Please arrive 30 minutes prior to your appointment time for registration purposes. Please make certain not to have anything to eat or drink 6 hours prior to your test. In addition, if you have any metal in your body, have a pacemaker or defibrillator, please be sure to let your ordering physician know. This test typically takes 45 minutes to 1 hour to complete. Should you need to reschedule, please call [phone] to do so. Check in at the main radiology department.   Take it easy, pace yourself per Dr Carlean Purl.    I appreciate the opportunity to care for you. Silvano Rusk, MD, [facility]

## 2019-07-13 NOTE — Progress Notes (Signed)
Mr. Boegel,  The iron level is low.  Please take ferrous sulfate 325 mg once a day to build up the iron and blood counts.  I will contact you again after the MRI is done and we can figure out when to recheck the blood counts.  I appreciate the opportunity to care for you. Gatha Mayer, MD, [facility]

## 2019-07-20 ENCOUNTER — Ambulatory Visit: Payer: PPO | Admitting: Internal Medicine

## 2019-07-23 ENCOUNTER — Encounter: Payer: Self-pay | Admitting: Internal Medicine

## 2019-07-23 ENCOUNTER — Other Ambulatory Visit: Payer: Self-pay

## 2019-07-23 ENCOUNTER — Other Ambulatory Visit: Payer: Self-pay | Admitting: Internal Medicine

## 2019-07-23 ENCOUNTER — Ambulatory Visit (HOSPITAL_COMMUNITY)
Admission: RE | Admit: 2019-07-23 | Discharge: 2019-07-23 | Disposition: A | Discharge status: Home / Self Care | Payer: PPO | Source: Ambulatory Visit | Attending: Internal Medicine | Admitting: Internal Medicine

## 2019-07-23 DIAGNOSIS — K869 Disease of pancreas, unspecified: Secondary | ICD-10-CM

## 2019-07-23 DIAGNOSIS — K862 Cyst of pancreas: Secondary | ICD-10-CM

## 2019-07-23 DIAGNOSIS — D62 Acute posthemorrhagic anemia: Secondary | ICD-10-CM

## 2019-07-23 DIAGNOSIS — R935 Abnormal findings on diagnostic imaging of other abdominal regions, including retroperitoneum: Secondary | ICD-10-CM | POA: Diagnosis not present

## 2019-07-23 HISTORY — DX: Cyst of pancreas: K86.2

## 2019-07-23 IMAGING — MR MR ABDOMEN WO/W CM MRCP
9 of 13 series · 26 of 48 positions shown · IV contrast (gadavist)
Comparison: CTA [DATE]. Abdomen and pelvis CT [DATE]

CLINICAL DATA: Tiny cystic lesions of pancreas on CTA study.

EXAM:
MRI ABDOMEN WITHOUT AND WITH CONTRAST (INCLUDING MRCP)
TECHNIQUE: Multiplanar multisequence MR imaging of the abdomen was performed
both before and after the administration of intravenous contrast.
Heavily T2-weighted images of the biliary and pancreatic ducts were
obtained, and three-dimensional MRCP images were rendered by post
processing.
CONTRAST:  7mL GADAVIST GADOBUTROL 1 MMOL/ML IV SOLN

[Series 4: T2 fat-sat · axial · 6.0mm · 1.25mm/px · 1 of 36 slices shown]
[im 1/36]
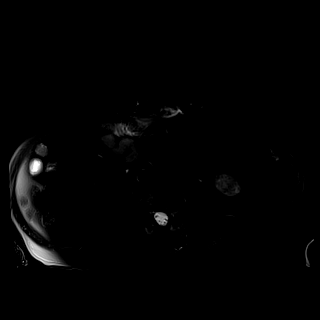

[Series 8: T1 · axial · 3.6mm · 1.25mm/px · z∈[-38,+217]mm · 3 of 72 slices shown]
[im 1/72]
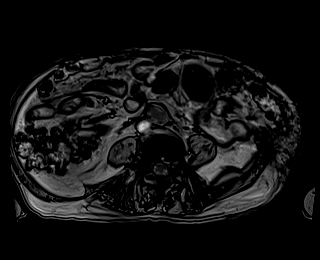
[im 36/72]
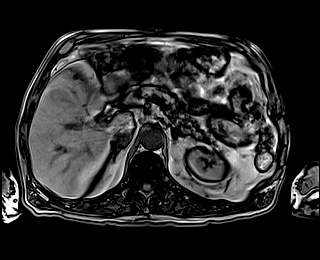
[im 72/72]
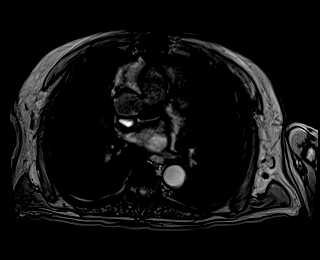

[Series 11: cor obl thk · sagittal · 50.0mm · 0.78mm/px · 1 of 9 slices shown]
[im 1/9]
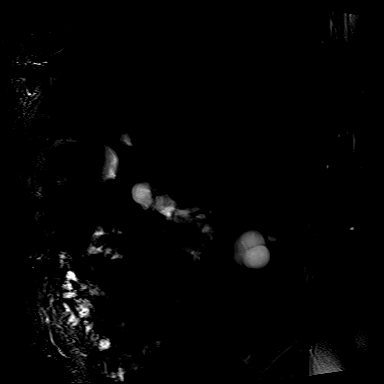

[Series 12: cor_3d_spc_trig-resp · 1 of 6 slices shown]
[im 1/6]
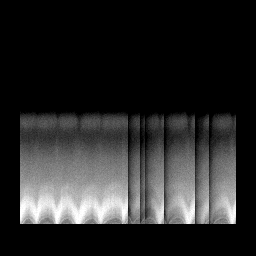

[Series 13: cor_3d_spc_trig · coronal · 1.0mm · 0.49mm/px · 5 of 88 slices shown]
[im 1/88]
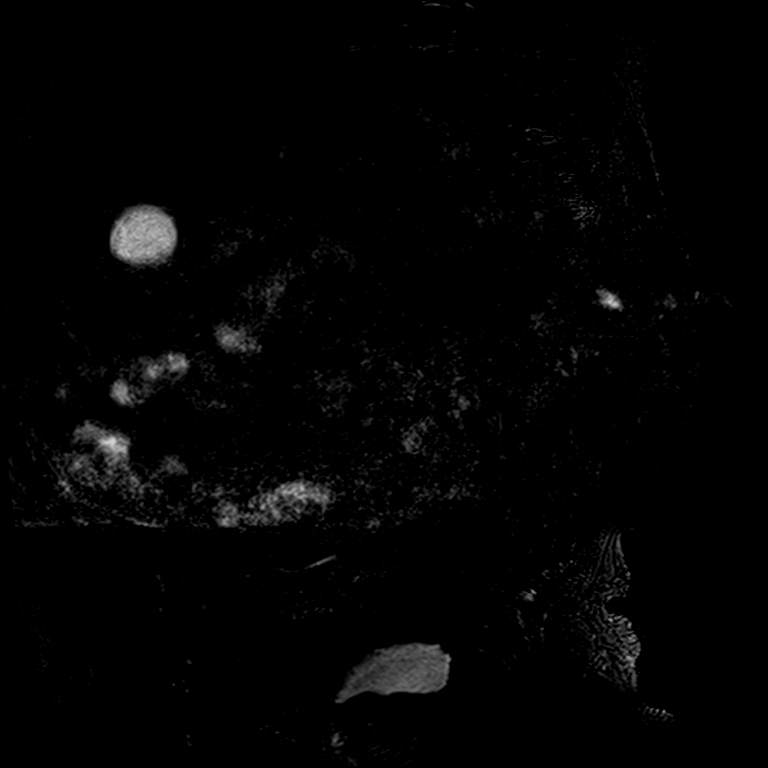
[im 22/88]
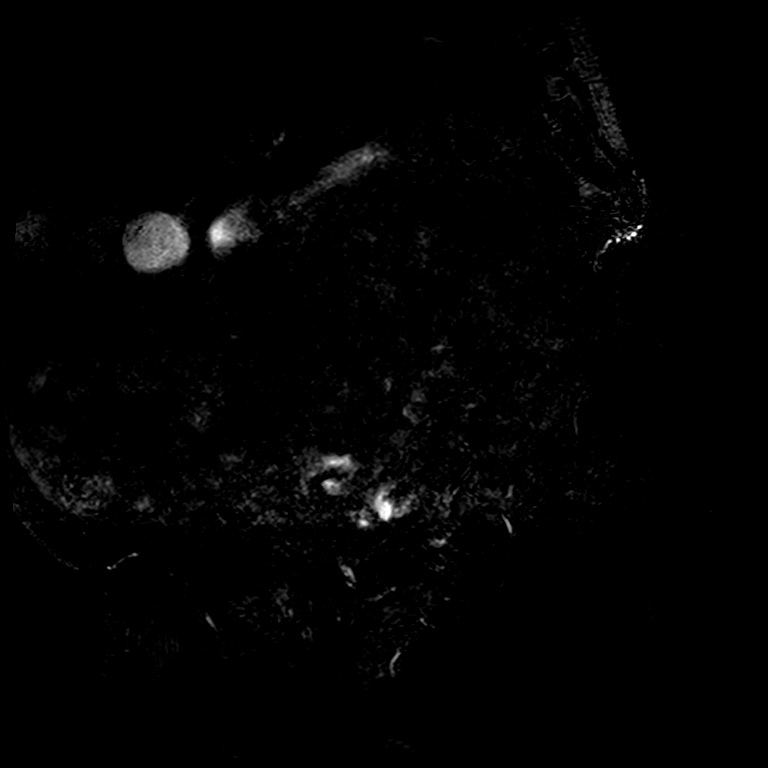
[im 44/88]
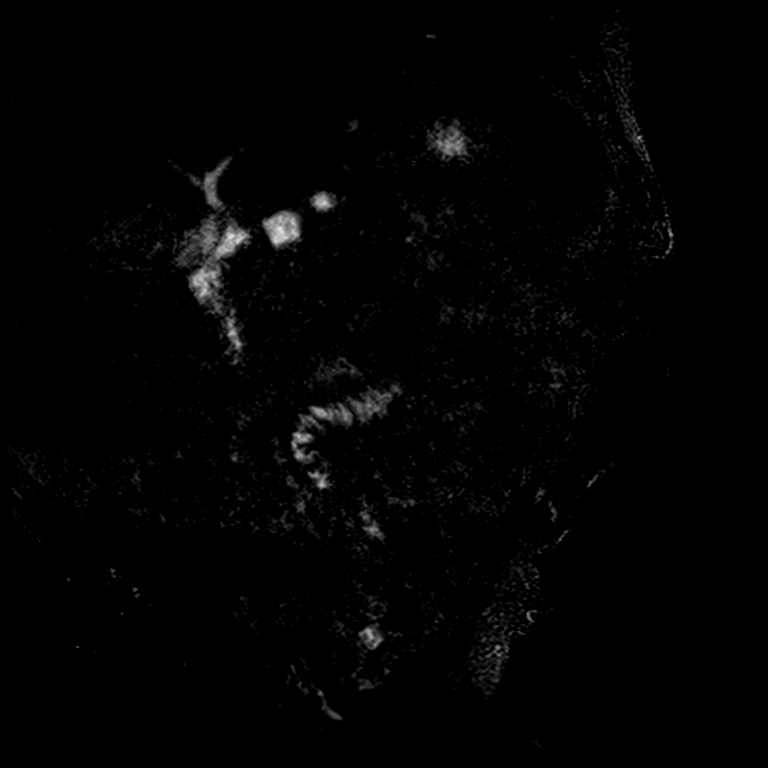
[im 66/88]
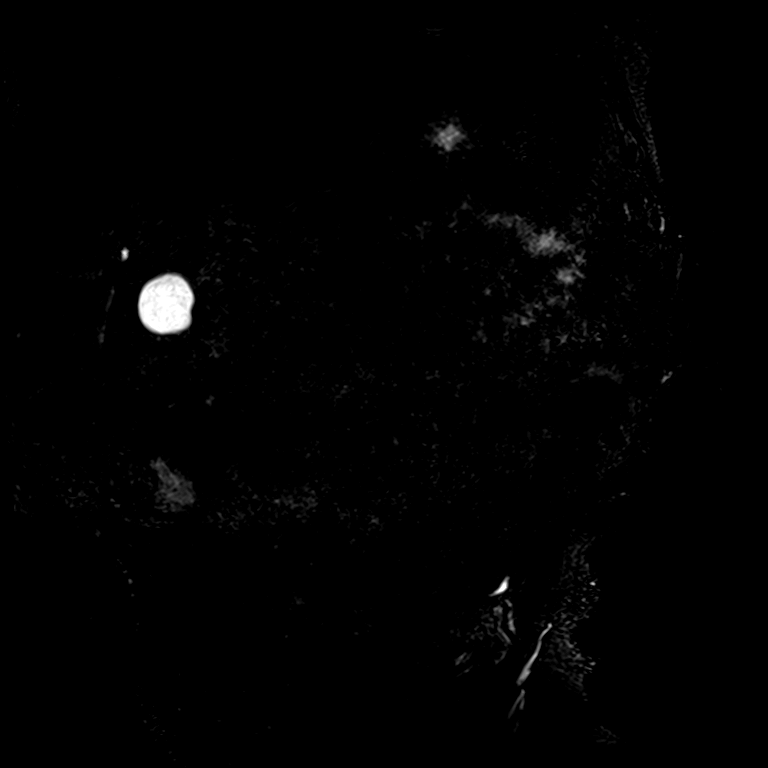
[im 88/88]
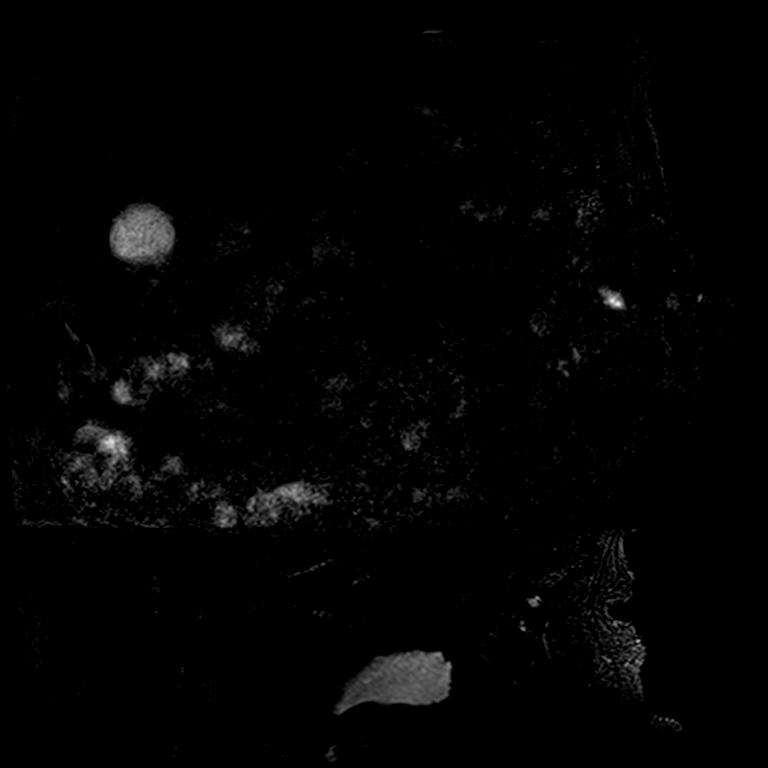

[Series 15: T2 · axial · 6.0mm · 1.56mm/px · z∈[-39,+213]mm · 2 of 36 slices shown]
[im 1/36]
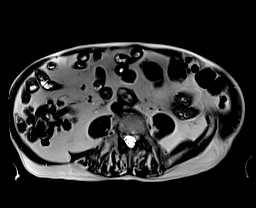
[im 36/36]
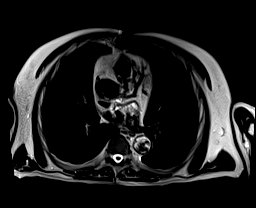

[Series 20: T1 dynamic · axial · 3.0mm · 1.25mm/px · z∈[-52,+233]mm · 6 of 96 slices shown (1 of 3)]
[im 1/96]
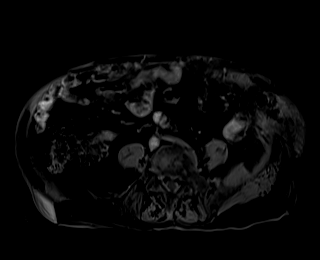
[im 20/96]
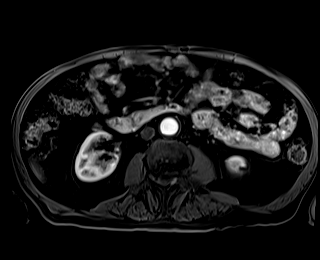
[im 39/96]
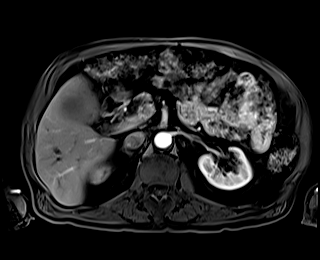
[im 58/96]
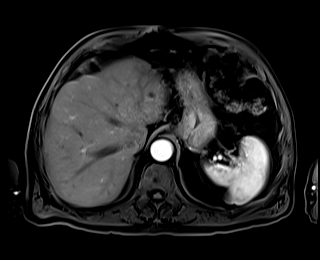
[im 77/96]
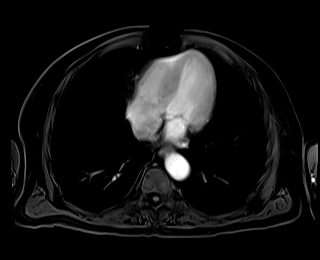
[im 96/96]
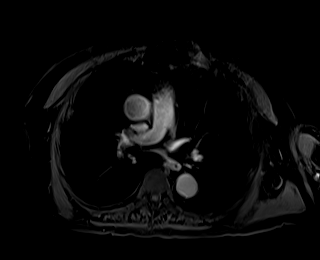

[Series 25: T1 dynamic · axial · 3.0mm · 1.25mm/px · z∈[-52,+233]mm · 6 of 96 slices shown (2 of 3)]
[im 1/96]
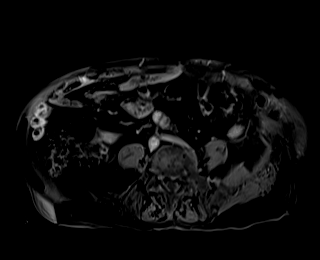
[im 20/96]
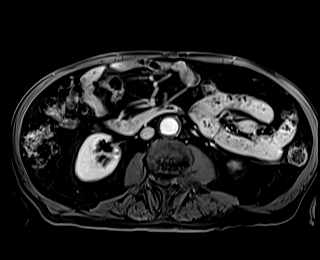
[im 39/96]
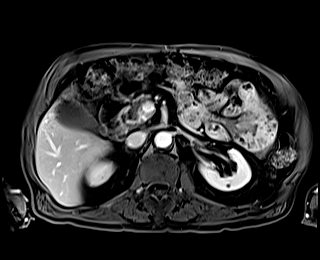
[im 58/96]
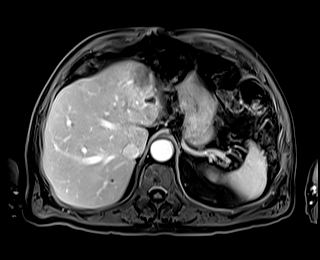
[im 77/96]
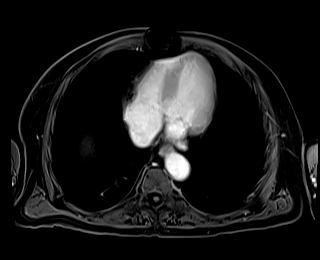
[im 96/96]
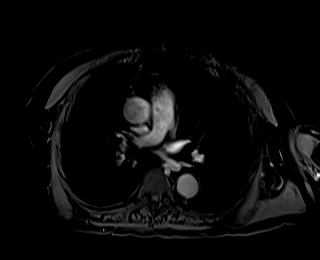

[Series 27: T1 dynamic · coronal · 5.0mm · 1.41mm/px · 1 of 52 slices shown (3 of 3)]
[im 1/52]
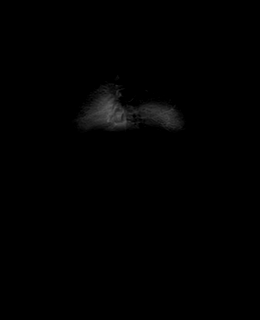

[26 of 48 positions shown; findings below may reference images not displayed]

FINDINGS: Lower chest: Unremarkable.

Hepatobiliary: Tiny cyst noted in the left liver. Some tiny hepatic
lesions are too small to characterize but likely benign. There is no
evidence for gallstones, gallbladder wall thickening, or
pericholecystic fluid. No intrahepatic or extrahepatic biliary
dilation.

Pancreas: 2.3 x 3.8 x 1.8 cm multi locular cystic lesion is
identified in the head of the pancreas. Lesion is relatively
well-defined and homogeneous with multiple internal septations
evident. No mural thickening or nodularity. No definite
communication with the main pancreatic duct. There is no main duct
dilatation in the pancreas.

Spleen:  No splenomegaly. No focal mass lesion.

Adrenals/Urinary Tract: No adrenal nodule or mass. 3.2 cm simple
cyst noted upper interpolar right kidney. Duplicated collecting
system noted in the right kidney with atrophy/scarring in the lower
pole moiety. Tiny T2 hyperintensities are seen scattered in the
parenchyma both kidneys without definite enhancement, too small to
characterize but likely cyst. Multiple bilateral nonobstructing
stone seen on recent CT not readily demonstrated on MRI today.

Stomach/Bowel: Stomach is unremarkable. No gastric wall thickening.
No evidence of outlet obstruction. Duodenum is normally positioned
as is the ligament of Treitz. No small bowel dilatation in the
visualized abdomen. Diffuse diverticular disease noted in the colon.

Vascular/Lymphatic: Status post aortic bypass grafting. No abdominal
lymphadenopathy.

Other:  No intraperitoneal free fluid.

Musculoskeletal: No suspicious marrow enhancement within the
visualized bony anatomy.
IMPRESSION: 1. 2.3 x 3.8 x 1.8 cm multi locular cystic lesion in the head of the
pancreas without definite communication with the main pancreatic
duct. No enhancing mural nodule. No main duct dilatation. Given low
risk MR imaging features, repeat MRI in 6 months is recommended to
ensure stability. This recommendation follows ACR consensus
guidelines: Management of Incidental Pancreatic Cysts: A White Paper
of the ACR Incidental Findings Committee. [HOSPITAL]
2. Duplicated collecting system in the right kidney with
atrophy/scarring in the lower pole moiety.

## 2019-07-23 MED ORDER — GADOBUTROL 1 MMOL/ML IV SOLN
7.0000 mL | Freq: Once | INTRAVENOUS | Status: AC | PRN
  Administered 2019-07-23: 7 mL via INTRAVENOUS

## 2019-08-03 NOTE — Progress Notes (Signed)
08/05/2019 Miguel Vazquez   28-Nov-1940  FP:[id number]  Primary Physician [name], [name], MD Primary Cardiologist: Dr [name]  HPI:  79 y/o male seen for follow up CAD. He has a history of DM, HTN, CRI, and PVD s/p remote AAA R&G, admitted 03/18/15 with a STEMI. At cath he had severe 3V and left main CAD. EF 20--25%. He went for urgent CABG. He tolerated this well. Post op he had PAF and was placed on Amiodarone. Amiodarone has since been discontinued. Echo in January 2017showed EF 35-40%. He was placed on lisinopril but developed hyperkalemia so this was stopped. On his follow up we resumed at a lower dose and potassium levels have been normal.   In February he had a fall and nondisplaced fracture of the right femur. This was treated conservatively.  He was admitted in March 2021 with lower GI bleed due to diverticulitis and diverticular bleed. Treated with antibiotics.   On follow up today he is doing well from a cardiac standpoint. His son who had Down's syndrome died in 03/27/23. Age 17.  He denies any chest pain, dyspnea, palpitations, edema. He tries to walk regularly. He has noted problems with dizziness. It is worse in the morning and also occurs with exertion. Notes his stamina is not as good.    Current Outpatient Medications  Medication Sig Dispense Refill  . aspirin EC 81 MG tablet Take 1 tablet (81 mg total) by mouth daily. 90 tablet 3  . atorvastatin (LIPITOR) 80 MG tablet TAKE 1 TABLET (80 MG TOTAL) BY MOUTH DAILY AT 6 PM. 90 tablet 2  . carvedilol (COREG) 6.25 MG tablet TAKE 1 TABLET BY MOUTH TWICE A DAY 180 tablet 3  . donepezil (ARICEPT) 10 MG tablet Take 1 tablet (10 mg total) by mouth at bedtime. 90 tablet 3  . EPINEPHrine 0.3 mg/0.3 mL IJ SOAJ injection Inject 0.3 mLs (0.3 mg total) into the muscle as needed for anaphylaxis. 2 each 3  . escitalopram (LEXAPRO) 10 MG tablet Take 10 mg by mouth daily.    Marland Kitchen glucose blood (FREESTYLE LITE) test strip Check Blood Sugars 1-2  times per day. DX: E11.9. 100 each 5  . Lancets (FREESTYLE) lancets Check Blood Sugars 1-2 times per day. DX: E11.9 100 each 5  . levothyroxine (SYNTHROID) 75 MCG tablet TAKE 1 TABLET BY MOUTH EVERY DAY 90 tablet 1  . losartan (COZAAR) 100 MG tablet TAKE 1 TABLET BY MOUTH EVERY DAY 90 tablet 1  . metFORMIN (GLUCOPHAGE) 1000 MG tablet TAKE 1 TABLET BY MOUTH TWICE A DAY 180 tablet 3  . Multiple Vitamin (MULTIVITAMIN) tablet Take 1 tablet by mouth daily. Centrium Silver once daily     . vitamin B-12 (CYANOCOBALAMIN) 1000 MCG tablet Take 1,000 mcg by mouth daily.    . vitamin C (ASCORBIC ACID) 500 MG tablet Take 500 mg by mouth every evening.      No current facility-administered medications for this visit.    Allergies  Allergen Reactions  . Food Anaphylaxis    TREE NUTS  . Shellfish Allergy Anaphylaxis  . Prednisone     "psychosis"  . Venomil Honey Bee Venom [Honey Bee Venom] Swelling    Localized swelling    Social History   Socioeconomic History  . Marital status: Married    Spouse name: Miguel Vazquez  . Number of children: 3  . Years of education: Not on file  . Highest education level: Not on file  Occupational History  . Not  on file  Tobacco Use  . Smoking status: Former Smoker    Packs/day: 1.50    Years: 20.00    Pack years: 30.00    Types: Cigarettes    Quit date: 08/24/1986    Years since quitting: 32.9  . Smokeless tobacco: Never Used  Substance and Sexual Activity  . Alcohol use: Not Currently    Comment: occ  . Drug use: No  . Sexual activity: Not on file  Other Topics Concern  . Not on file  Social History Narrative  . Not on file   Social Determinants of Health   Financial Resource Strain:   . Difficulty of Paying Living Expenses:   Food Insecurity:   . Worried About Charity fundraiser in the Last Year:   . Arboriculturist in the Last Year:   Transportation Needs:   . Film/video editor (Medical):   Marland Kitchen Lack of Transportation (Non-Medical):    Physical Activity:   . Days of Exercise per Week:   . Minutes of Exercise per Session:   Stress:   . Feeling of Stress :   Social Connections:   . Frequency of Communication with Friends and Family:   . Frequency of Social Gatherings with Friends and Family:   . Attends Religious Services:   . Active Member of Clubs or Organizations:   . Attends Archivist Meetings:   Marland Kitchen Marital Status:   Intimate Partner Violence:   . Fear of Current or Ex-Partner:   . Emotionally Abused:   Marland Kitchen Physically Abused:   . Sexually Abused:      Review of Systems: As noted in HPI All other systems reviewed and are otherwise negative except as noted above.    Blood pressure (!) 142/78, pulse 72, temperature (!) 97.2 F (36.2 C), height 5\' 8"  (1.727 m), weight 153 lb (69.4 kg), SpO2 95 %.  GENERAL:  Well appearing pale WM in NAD HEENT:  PERRL, EOMI, sclera are clear. Oropharynx is clear. NECK:  No jugular venous distention, carotid upstroke brisk and symmetric, no bruits, no thyromegaly or adenopathy LUNGS:  Clear to auscultation bilaterally CHEST:  Unremarkable HEART:  RRR,  PMI not displaced or sustained,S1 and S2 within normal limits, no S3, no S4: no clicks, no rubs, soft [id number] systolic murmur LSB ABD:  Soft, nontender. BS +, no masses or bruits. No hepatomegaly, no splenomegaly EXT:  2 + pulses throughout, no edema, no cyanosis no clubbing SKIN:  Warm and dry.  No rashes NEURO:  Alert and oriented x 3. Cranial nerves II through XII intact. PSYCH:  Cognitively intact   EKG is done today. NSR rate 72. Normal. I have personally reviewed and interpreted this study.   Lab Results  Component Value Date   WBC 8.7 07/10/2019   HGB 8.6 Repeated and verified X2. (L) 07/10/2019   HCT 25.0 (L) 07/10/2019   PLT 202.0 07/10/2019   GLUCOSE 129 (H) 06/26/2019   CHOL 117 11/07/2018   TRIG 97.0 11/07/2018   HDL 39.50 11/07/2018   LDLDIRECT 84.1 05/21/2013   LDLCALC 59 11/07/2018   ALT 23  06/24/2019   AST 22 06/24/2019   NA 140 06/26/2019   K 4.0 06/26/2019   CL 101 06/26/2019   CREATININE 0.99 06/26/2019   BUN 17 06/26/2019   CO2 29 06/26/2019   TSH 2.59 02/09/2019   INR 1.63 (H) 03/18/2015   HGBA1C 6.3 (H) 06/25/2019   MICROALBUR 6.8 (H) 11/21/2009   Echo: 12/13/15:  Study Conclusions  - Left ventricle: The cavity size was normal. Wall thickness was   increased in a pattern of mild LVH. Systolic function was mildly   to moderately reduced. The estimated ejection fraction was in the   range of 40% to 45%. Diffuse hypokinesis. There is dyskinesis of   the basalinferolateral myocardium. Doppler parameters are   consistent with abnormal left ventricular relaxation (grade 1   diastolic dysfunction). - Mitral valve: Calcified annulus. - Left atrium: The atrium was mildly dilated. - Pulmonary arteries: Systolic pressure was mildly increased. PA   peak pressure: 32 mm Hg (S).  Impressions:  - Global hypokinesis and dyskinesis of the basal inferior lateral   wall; overall mild to moderate LV dysfunction; grade 1 diastolic   dysfunction; mild LAE; mild TR with mildly elevated pulmonary   pressure.   ASSESSMENT AND PLAN:  1. CAD s/p STEMI in December 2016. Urgent CABG for severe 3 vessel and left main disease. He is asymptomatic. Recommend continue ASA to 81 mg daily, continue beta blocker, and statin.  2. Ischemic cardiomyopathy. EF improved to 40-45%. Well compensated. Will continue Coreg  6.25 mg bid and losartan.   3. Hyperlipidemia. On lipitor. Excellent control.  4. Post op Afib. Resolved. No recurrence  5. DM type 2 with renal manifestations. Last A1c 6.2%. 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 S/p AAA repair.- remote  8. Diverticular bleed with anemia.  9. Dizziness. No clear cardiac cause.   I will follow up in one year.    Shadi Larner [place] MD,FACC  08/05/2019 4:03 PM

## 2019-08-05 ENCOUNTER — Ambulatory Visit: Payer: PPO | Admitting: Cardiology

## 2019-08-05 ENCOUNTER — Other Ambulatory Visit: Payer: Self-pay

## 2019-08-05 ENCOUNTER — Encounter: Payer: Self-pay | Admitting: Cardiology

## 2019-08-05 VITALS — BP 142/78 | HR 72 | Temp 97.2°F | Ht 68.0 in | Wt 153.0 lb

## 2019-08-05 DIAGNOSIS — I10 Essential (primary) hypertension: Secondary | ICD-10-CM | POA: Diagnosis not present

## 2019-08-05 DIAGNOSIS — E7800 Pure hypercholesterolemia, unspecified: Secondary | ICD-10-CM | POA: Diagnosis not present

## 2019-08-05 DIAGNOSIS — Z951 Presence of aortocoronary bypass graft: Secondary | ICD-10-CM

## 2019-08-05 DIAGNOSIS — R42 Dizziness and giddiness: Secondary | ICD-10-CM | POA: Diagnosis not present

## 2019-08-05 DIAGNOSIS — I25810 Atherosclerosis of coronary artery bypass graft(s) without angina pectoris: Secondary | ICD-10-CM

## 2019-09-14 ENCOUNTER — Other Ambulatory Visit: Payer: Self-pay

## 2019-09-15 ENCOUNTER — Encounter: Payer: Self-pay | Admitting: Family Medicine

## 2019-09-15 ENCOUNTER — Ambulatory Visit (INDEPENDENT_AMBULATORY_CARE_PROVIDER_SITE_OTHER): Payer: PPO | Admitting: Family Medicine

## 2019-09-15 VITALS — BP 116/74 | HR 66 | Temp 98.3°F | Wt 149.6 lb

## 2019-09-15 DIAGNOSIS — K5731 Diverticulosis of large intestine without perforation or abscess with bleeding: Secondary | ICD-10-CM | POA: Diagnosis not present

## 2019-09-15 DIAGNOSIS — E1121 Type 2 diabetes mellitus with diabetic nephropathy: Secondary | ICD-10-CM | POA: Diagnosis not present

## 2019-09-15 DIAGNOSIS — D509 Iron deficiency anemia, unspecified: Secondary | ICD-10-CM | POA: Diagnosis not present

## 2019-09-15 LAB — POCT GLYCOSYLATED HEMOGLOBIN (HGB A1C): Hemoglobin A1C: 5.9 % — AB (ref 4.0–5.6)

## 2019-09-15 NOTE — Progress Notes (Signed)
Established Patient Office Visit  Subjective:  Patient ID: Miguel Vazquez, male    DOB: 10/14/1940  Age: 79 y.o. MRN: [id number]  CC:  Chief Complaint  Patient presents with  . Follow-up    Pt is here for 3 month follow up     HPI Miguel Vazquez presents for medical follow-up.  When he was last seen here he had diverticulosis bleed and extreme weakness.  He was admitted and hemoglobin went down to 8 range.  He did not receive any transfusion.  His symptoms are stable.  He had CT angiogram of the abdomen and pelvis which showed evidence for diverticulosis and question of diverticulitis.  He was briefly treated with antibiotics.  He had no fever.  He has had no recurrent bleeding since then.  He was briefly taken off aspirin but has now been placed back on this by cardiology.  No abdominal pain.  Has had somewhat poor appetite.  Type 2 diabetes.  Currently on Metformin.  Not monitoring blood sugars regularly.  This is been fairly well controlled.  He does feel somewhat off balance.  He had fall back in the wintertime and had isolated nondisplaced fracture greater trochanter.  He was seen by orthopedics and manage expectantly.  He has no hip pain whatsoever at this time.  There is no evidence for intertrochanteric fracture or femoral neck fracture.  Past Medical History:  Diagnosis Date  . Acute blood loss anemia   . Arthritis   . Brain aneurysm 2018   followed by Dr [name]  . Cataract   . Chronic kidney disease    kidney stone- 40 years ago  . Coronary artery disease   . 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 Diabetes mellitus   . Diverticulitis   . GERD (gastroesophageal reflux disease)   . Hx of abdominal aortic aneurysm 1998  . Hyperlipidemia   . Hypertension   . Lower GI bleed   . Myocardial infarction ([facility])    2016  . Personal history of colonic polyps - adenomas 09/09/2013    Past Surgical History:  Procedure Laterality Date  . ABDOMINAL AORTIC ANEURYSM REPAIR  1998  . CARDIAC  CATHETERIZATION N/A 03/18/2015   Procedure: Left Heart Cath and Coronary Angiography;  Surgeon: Peter M Martinique, MD;  Location: [facility];  Service: Cardiovascular;  Laterality: N/A;  . cataract surgery Right   . COLONOSCOPY    . CORONARY ARTERY BYPASS GRAFT N/A 03/18/2015   Procedure: CORONARY ARTERY BYPASS GRAFTING (CABG) x  three, using left internal mammary artery and right leg greater saphenous vein harvested endoscopically;  Surgeon: Melrose Nakayama, MD;  Location: [facility];  Service: Open Heart Surgery;  Laterality: N/A;  . INGUINAL HERNIA REPAIR Bilateral   . INTRAOPERATIVE TRANSESOPHAGEAL ECHOCARDIOGRAM N/A 03/18/2015   Procedure: INTRAOPERATIVE TRANSESOPHAGEAL ECHOCARDIOGRAM;  Surgeon: Melrose Nakayama, MD;  Location: [facility];  Service: Open Heart Surgery;  Laterality: N/A;  . open heart surgery  2016  . UMBILICAL HERNIA REPAIR  1965    Family History  Problem Relation Age of Onset  . Bone cancer Mother   . Diabetes Paternal Aunt   . Stroke Son   . Down syndrome Son   . Colon cancer Neg Hx   . Esophageal cancer Neg Hx   . Rectal cancer Neg Hx   . Stomach cancer Neg Hx     Social History   Socioeconomic History  . Marital status: Married    Spouse name: [name]  .  Number of children: 3  . Years of education: Not on file  . Highest education level: Not on file  Occupational History  . Not on file  Tobacco Use  . Smoking status: Former Smoker    Packs/day: 1.50    Years: 20.00    Pack years: 30.00    Types: Cigarettes    Quit date: 08/24/1986    Years since quitting: 33.0  . Smokeless tobacco: Never Used  Vaping Use  . Vaping Use: Never used  Substance and Sexual Activity  . Alcohol use: Not Currently    Comment: occ  . Drug use: No  . Sexual activity: Not on file  Other Topics Concern  . Not on file  Social History Narrative  . Not on file   Social Determinants of Health   Financial Resource Strain:   . Difficulty of Paying Living Expenses:    Food Insecurity:   . Worried About Charity fundraiser in the Last Year:   . Arboriculturist in the Last Year:   Transportation Needs:   . Film/video editor (Medical):   Marland Kitchen Lack of Transportation (Non-Medical):   Physical Activity:   . Days of Exercise per Week:   . Minutes of Exercise per Session:   Stress:   . Feeling of Stress :   Social Connections:   . Frequency of Communication with Friends and Family:   . Frequency of Social Gatherings with Friends and Family:   . Attends Religious Services:   . Active Member of Clubs or Organizations:   . Attends Archivist Meetings:   Marland Kitchen Marital Status:   Intimate Partner Violence:   . Fear of Current or Ex-Partner:   . Emotionally Abused:   Marland Kitchen Physically Abused:   . Sexually Abused:     Outpatient Medications Prior to Visit  Medication Sig Dispense Refill  . aspirin EC 81 MG tablet Take 1 tablet (81 mg total) by mouth daily. 90 tablet 3  . atorvastatin (LIPITOR) 80 MG tablet TAKE 1 TABLET (80 MG TOTAL) BY MOUTH DAILY AT 6 PM. 90 tablet 2  . carvedilol (COREG) 6.25 MG tablet TAKE 1 TABLET BY MOUTH TWICE A DAY 180 tablet 3  . donepezil (ARICEPT) 10 MG tablet Take 1 tablet (10 mg total) by mouth at bedtime. 90 tablet 3  . EPINEPHrine 0.3 mg/0.3 mL IJ SOAJ injection Inject 0.3 mLs (0.3 mg total) into the muscle as needed for anaphylaxis. 2 each 3  . escitalopram (LEXAPRO) 10 MG tablet Take 10 mg by mouth daily.    . ferrous sulfate 324 MG TBEC Take 324 mg by mouth.    Marland Kitchen glucose blood (FREESTYLE LITE) test strip Check Blood Sugars 1-2 times per day. DX: E11.9. 100 each 5  . Lancets (FREESTYLE) lancets Check Blood Sugars 1-2 times per day. DX: E11.9 100 each 5  . levothyroxine (SYNTHROID) 75 MCG tablet TAKE 1 TABLET BY MOUTH EVERY DAY 90 tablet 1  . losartan (COZAAR) 100 MG tablet TAKE 1 TABLET BY MOUTH EVERY DAY 90 tablet 1  . metFORMIN (GLUCOPHAGE) 1000 MG tablet TAKE 1 TABLET BY MOUTH TWICE A DAY 180 tablet 3  . Multiple  Vitamin (MULTIVITAMIN) tablet Take 1 tablet by mouth daily. Centrium Silver once daily     . vitamin B-12 (CYANOCOBALAMIN) 1000 MCG tablet Take 1,000 mcg by mouth daily.    . vitamin C (ASCORBIC ACID) 500 MG tablet Take 500 mg by mouth every evening.  No facility-administered medications prior to visit.    Allergies  Allergen Reactions  . Food Anaphylaxis    TREE NUTS  . Shellfish Allergy Anaphylaxis  . Prednisone     "psychosis"  . Venomil Honey Bee Venom [Honey Bee Venom] Swelling    Localized swelling    ROS Review of Systems  Constitutional: Negative for fatigue.  Eyes: Negative for visual disturbance.  Respiratory: Negative for cough, chest tightness and shortness of breath.   Cardiovascular: Negative for chest pain, palpitations and leg swelling.  Neurological: Negative for dizziness, syncope, weakness, light-headedness and headaches.      Objective:    Physical Exam Constitutional:      Appearance: He is well-developed.  HENT:     Right Ear: External ear normal.     Left Ear: External ear normal.  Eyes:     Pupils: Pupils are equal, round, and reactive to light.  Neck:     Thyroid: No thyromegaly.  Cardiovascular:     Rate and Rhythm: Normal rate and regular rhythm.  Pulmonary:     Effort: Pulmonary effort is normal. No respiratory distress.     Breath sounds: Normal breath sounds. No wheezing or rales.  Musculoskeletal:     Cervical back: Neck supple.  Neurological:     Mental Status: He is alert and oriented to person, place, and time.     BP 116/74 (BP Location: Left Arm, Patient Position: Sitting, Cuff Size: Normal)   Pulse 66   Temp 98.3 F (36.8 C) (Temporal)   Wt 149 lb 9.6 oz (67.9 kg)   SpO2 94%   BMI 22.75 kg/m  Wt Readings from Last 3 Encounters:  09/15/19 149 lb 9.6 oz (67.9 kg)  08/05/19 153 lb (69.4 kg)  07/10/19 151 lb (68.5 kg)     Health Maintenance Due  Topic Date Due  . Hepatitis C Screening  Never done  .  OPHTHALMOLOGY EXAM  07/13/2015  . FOOT EXAM  09/12/2018    There are no preventive care reminders to display for this patient.  Lab Results  Component Value Date   TSH 2.59 02/09/2019   Lab Results  Component Value Date   WBC 8.7 07/10/2019   HGB 8.6 Repeated and verified X2. (L) 07/10/2019   HCT 25.0 (L) 07/10/2019   MCV 97.8 07/10/2019   PLT 202.0 07/10/2019   Lab Results  Component Value Date   NA 140 06/26/2019   K 4.0 06/26/2019   CO2 29 06/26/2019   GLUCOSE 129 (H) 06/26/2019   BUN 17 06/26/2019   CREATININE 0.99 06/26/2019   BILITOT 0.5 06/24/2019   ALKPHOS 67 06/24/2019   AST 22 06/24/2019   ALT 23 06/24/2019   PROT 6.6 06/24/2019   ALBUMIN 3.6 06/24/2019   CALCIUM 9.6 06/26/2019   ANIONGAP 10 06/26/2019   GFR 66.26 04/15/2019   Lab Results  Component Value Date   CHOL 117 11/07/2018   Lab Results  Component Value Date   HDL 39.50 11/07/2018   Lab Results  Component Value Date   LDLCALC 59 11/07/2018   Lab Results  Component Value Date   TRIG 97.0 11/07/2018   Lab Results  Component Value Date   CHOLHDL 3 11/07/2018   Lab Results  Component Value Date   HGBA1C 5.9 (A) 09/15/2019      Assessment & Plan:   Problem List Items Addressed This Visit      Unprioritized   Type 2 diabetes mellitus with diabetic nephropathy, without long-term  current use of insulin (HCC) - Primary   Relevant Orders   POCT glycosylated hemoglobin (Hb A1C) (Completed)    Other Visit Diagnoses    Iron deficiency anemia, unspecified iron deficiency anemia type       Relevant Medications   ferrous sulfate 324 MG TBEC   Other Relevant Orders   CBC with Differential/Platelet   Diverticulosis of large intestine with hemorrhage        Follow-up promptly for any recurrent rectal bleeding  Recommend CBC and this was ordered for future lab as we had no one in lab today  Handout given on iron rich diet  Plan routine follow-up in 3 months.  If A1c remains well  controlled at that time reduce dosage of Metformin  We did discuss possible further PT for balance issues and he declines at this time  No orders of the defined types were placed in this encounter.   Follow-up: Return in about 3 months (around 12/16/2019).    Carolann Littler, MD

## 2019-09-15 NOTE — Patient Instructions (Signed)

## 2019-09-18 ENCOUNTER — Other Ambulatory Visit: Payer: Self-pay | Admitting: Family Medicine

## 2019-09-18 DIAGNOSIS — E1121 Type 2 diabetes mellitus with diabetic nephropathy: Secondary | ICD-10-CM

## 2019-09-18 DIAGNOSIS — E785 Hyperlipidemia, unspecified: Secondary | ICD-10-CM

## 2019-09-22 ENCOUNTER — Other Ambulatory Visit: Payer: Self-pay

## 2019-09-23 ENCOUNTER — Other Ambulatory Visit (INDEPENDENT_AMBULATORY_CARE_PROVIDER_SITE_OTHER): Payer: PPO

## 2019-09-23 DIAGNOSIS — D509 Iron deficiency anemia, unspecified: Secondary | ICD-10-CM | POA: Diagnosis not present

## 2019-09-23 LAB — CBC WITH DIFFERENTIAL/PLATELET
Basophils Absolute: 0.1 10*3/uL (ref 0.0–0.1)
Basophils Relative: 1.2 % (ref 0.0–3.0)
Eosinophils Absolute: 0.6 10*3/uL (ref 0.0–0.7)
Eosinophils Relative: 8.1 % — ABNORMAL HIGH (ref 0.0–5.0)
HCT: 38.7 % — ABNORMAL LOW (ref 39.0–52.0)
Hemoglobin: 13 g/dL (ref 13.0–17.0)
Lymphocytes Relative: 13.6 % (ref 12.0–46.0)
Lymphs Abs: 1 10*3/uL (ref 0.7–4.0)
MCHC: 33.7 g/dL (ref 30.0–36.0)
MCV: 93.1 fl (ref 78.0–100.0)
Monocytes Absolute: 0.5 10*3/uL (ref 0.1–1.0)
Monocytes Relative: 6.5 % (ref 3.0–12.0)
Neutro Abs: 5.4 10*3/uL (ref 1.4–7.7)
Neutrophils Relative %: 70.6 % (ref 43.0–77.0)
Platelets: 138 10*3/uL — ABNORMAL LOW (ref 150.0–400.0)
RBC: 4.16 Mil/uL — ABNORMAL LOW (ref 4.22–5.81)
RDW: 16.1 % — ABNORMAL HIGH (ref 11.5–15.5)
WBC: 7.7 10*3/uL (ref 4.0–10.5)

## 2019-10-19 ENCOUNTER — Other Ambulatory Visit: Payer: Self-pay | Admitting: Family Medicine

## 2019-10-29 ENCOUNTER — Other Ambulatory Visit: Payer: Self-pay | Admitting: Cardiology

## 2019-11-20 ENCOUNTER — Ambulatory Visit: Payer: PPO

## 2019-11-25 ENCOUNTER — Ambulatory Visit (INDEPENDENT_AMBULATORY_CARE_PROVIDER_SITE_OTHER): Payer: PPO

## 2019-11-25 ENCOUNTER — Other Ambulatory Visit: Payer: Self-pay

## 2019-11-25 DIAGNOSIS — Z0000 Encounter for general adult medical examination without abnormal findings: Secondary | ICD-10-CM | POA: Diagnosis not present

## 2019-11-25 NOTE — Patient Instructions (Signed)
Miguel Vazquez , Thank you for taking time to come for your Medicare Wellness Visit. I appreciate your ongoing commitment to your health goals. Please review the following plan we discussed and let me know if I can assist you in the future.   Screening recommendations/referrals: Colonoscopy: No longer required Recommended yearly ophthalmology/optometry visit for glaucoma screening and checkup Recommended yearly dental visit for hygiene and checkup  Vaccinations: Influenza vaccine: up to date, next due this flu season 2021-2022 Pneumococcal vaccine: Completed series Tdap vaccine: Up to date, next due 02/15/2029 Shingles vaccine: Currently due for shingrix please contact your pharmacy to discuss cost and to receive     Advanced directives: Copies on file   Conditions/risks identified: None   Next appointment: 12/16/2019 @ 1:15 PM  With Dr. Elease Hashimoto  Preventive Care 78 Years and Older, Male Preventive care refers to lifestyle choices and visits with your health care provider that can promote health and wellness. What does preventive care include?  A yearly physical exam. This is also called an annual well check.  Dental exams once or twice a year.  Routine eye exams. Ask your health care provider how often you should have your eyes checked.  Personal lifestyle choices, including:  Daily care of your teeth and gums.  Regular physical activity.  Eating a healthy diet.  Avoiding tobacco and drug use.  Limiting alcohol use.  Practicing safe sex.  Taking low doses of aspirin every day.  Taking vitamin and mineral supplements as recommended by your health care provider. What happens during an annual well check? The services and screenings done by your health care provider during your annual well check will depend on your age, overall health, lifestyle risk factors, and family history of disease. Counseling  Your health care provider may ask you questions about your:  Alcohol  use.  Tobacco use.  Drug use.  Emotional well-being.  Home and relationship well-being.  Sexual activity.  Eating habits.  History of falls.  Memory and ability to understand (cognition).  Work and work Statistician. Screening  You may have the following tests or measurements:  Height, weight, and BMI.  Blood pressure.  Lipid and cholesterol levels. These may be checked every 5 years, or more frequently if you are over 78 years old.  Skin check.  Lung cancer screening. You may have this screening every year starting at age 78 if you have a 30-pack-year history of smoking and currently smoke or have quit within the past 15 years.  Fecal occult blood test (FOBT) of the stool. You may have this test every year starting at age 78.  Flexible sigmoidoscopy or colonoscopy. You may have a sigmoidoscopy every 5 years or a colonoscopy every 10 years starting at age 78.  Prostate cancer screening. Recommendations will vary depending on your family history and other risks.  Hepatitis C blood test.  Hepatitis B blood test.  Sexually transmitted disease (STD) testing.  Diabetes screening. This is done by checking your blood sugar (glucose) after you have not eaten for a while (fasting). You may have this done every 1-3 years.  Abdominal aortic aneurysm (AAA) screening. You may need this if you are a current or former smoker.  Osteoporosis. You may be screened starting at age 78 if you are at high risk. Talk with your health care provider about your test results, treatment options, and if necessary, the need for more tests. Vaccines  Your health care provider may recommend certain vaccines, such as:  Influenza vaccine.  This is recommended every year.  Tetanus, diphtheria, and acellular pertussis (Tdap, Td) vaccine. You may need a Td booster every 10 years.  Zoster vaccine. You may need this after age 50.  Pneumococcal 13-valent conjugate (PCV13) vaccine. One dose is  recommended after age 79.  Pneumococcal polysaccharide (PPSV23) vaccine. One dose is recommended after age 78. Talk to your health care provider about which screenings and vaccines you need and how often you need them. This information is not intended to replace advice given to you by your health care provider. Make sure you discuss any questions you have with your health care provider. Document Released: 04/15/2015 Document Revised: 12/07/2015 Document Reviewed: 01/18/2015 Elsevier Interactive Patient Education  2017 [employer] Prevention in the Home Falls can cause injuries. They can happen to people of all ages. There are many things you can do to make your home safe and to help prevent falls. What can I do on the outside of my home?  Regularly fix the edges of walkways and driveways and fix any cracks.  Remove anything that might make you trip as you walk through a door, such as a raised step or threshold.  Trim any bushes or trees on the path to your home.  Use bright outdoor lighting.  Clear any walking paths of anything that might make someone trip, such as rocks or tools.  Regularly check to see if handrails are loose or broken. Make sure that both sides of any steps have handrails.  Any raised decks and porches should have guardrails on the edges.  Have any leaves, snow, or ice cleared regularly.  Use sand or salt on walking paths during winter.  Clean up any spills in your garage right away. This includes oil or grease spills. What can I do in the bathroom?  Use night lights.  Install grab bars by the toilet and in the tub and shower. Do not use towel bars as grab bars.  Use non-skid mats or decals in the tub or shower.  If you need to sit down in the shower, use a plastic, non-slip stool.  Keep the floor dry. Clean up any water that spills on the floor as soon as it happens.  Remove soap buildup in the tub or shower regularly.  Attach bath mats  securely with double-sided non-slip rug tape.  Do not have throw rugs and other things on the floor that can make you trip. What can I do in the bedroom?  Use night lights.  Make sure that you have a light by your bed that is easy to reach.  Do not use any sheets or blankets that are too big for your bed. They should not hang down onto the floor.  Have a firm chair that has side arms. You can use this for support while you get dressed.  Do not have throw rugs and other things on the floor that can make you trip. What can I do in the kitchen?  Clean up any spills right away.  Avoid walking on wet floors.  Keep items that you use a lot in easy-to-reach places.  If you need to reach something above you, use a strong step stool that has a grab bar.  Keep electrical cords out of the way.  Do not use floor polish or wax that makes floors slippery. If you must use wax, use non-skid floor wax.  Do not have throw rugs and other things on the floor that can make  you trip. What can I do with my stairs?  Do not leave any items on the stairs.  Make sure that there are handrails on both sides of the stairs and use them. Fix handrails that are broken or loose. Make sure that handrails are as long as the stairways.  Check any carpeting to make sure that it is firmly attached to the stairs. Fix any carpet that is loose or worn.  Avoid having throw rugs at the top or bottom of the stairs. If you do have throw rugs, attach them to the floor with carpet tape.  Make sure that you have a light switch at the top of the stairs and the bottom of the stairs. If you do not have them, ask someone to add them for you. What else can I do to help prevent falls?  Wear shoes that:  Do not have high heels.  Have rubber bottoms.  Are comfortable and fit you well.  Are closed at the toe. Do not wear sandals.  If you use a stepladder:  Make sure that it is fully opened. Do not climb a closed  stepladder.  Make sure that both sides of the stepladder are locked into place.  Ask someone to hold it for you, if possible.  Clearly mark and make sure that you can see:  Any grab bars or handrails.  First and last steps.  Where the edge of each step is.  Use tools that help you move around (mobility aids) if they are needed. These include:  Canes.  Walkers.  Scooters.  Crutches.  Turn on the lights when you go into a dark area. Replace any light bulbs as soon as they burn out.  Set up your furniture so you have a clear path. Avoid moving your furniture around.  If any of your floors are uneven, fix them.  If there are any pets around you, be aware of where they are.  Review your medicines with your doctor. Some medicines can make you feel dizzy. This can increase your chance of falling. Ask your doctor what other things that you can do to help prevent falls. This information is not intended to replace advice given to you by your health care provider. Make sure you discuss any questions you have with your health care provider. Document Released: 01/13/2009 Document Revised: 08/25/2015 Document Reviewed: 04/23/2014 Elsevier Interactive Patient Education  2017 [employer].

## 2019-11-25 NOTE — Progress Notes (Signed)
Subjective:   Miguel Vazquez is a 79 y.o. male who presents for Medicare Annual/Subsequent preventive examination.   I connected with Miguel Vazquez today by telephone and verified that I am speaking with the correct person using two identifiers. Location patient: home Location provider: work Persons participating in the virtual visit: patient, provider.   I discussed the limitations, risks, security and privacy concerns of performing an evaluation and management service by telephone and the availability of in person appointments. I also discussed with the patient that there may be a patient responsible charge related to this service. The patient expressed understanding and verbally consented to this telephonic visit.    Interactive audio and video telecommunications were attempted between this provider and patient, however failed, due to patient having technical difficulties OR patient did not have access to video capability.  We continued and completed visit with audio only.     Review of Systems    N/A Cardiac Risk Factors include: advanced age (>27men, >57 women);male gender;diabetes mellitus;hypertension     Objective:    Today's Vitals   There is no height or weight on file to calculate BMI.  Advanced Directives 11/25/2019 06/24/2019 02/16/2019 09/11/2017 03/06/2017 02/07/2017 09/03/2016  Does Patient Have a Medical Advance Directive? Yes Yes Yes Yes Yes Yes No  Type of Paramedic of [place];Living will [place];Living will [place];Living will - Healthcare Power of [place];Living will -  Does patient want to make changes to medical advance directive? No - Patient declined - - - - - -  Copy of [place] in Chart? Yes - validated most recent copy scanned in chart (See row information) - No - copy requested - - - -  Would patient like information on creating a medical advance  directive? - - - - - - No - Patient declined    Current Medications (verified) Outpatient Encounter Medications as of 11/25/2019  Medication Sig  . aspirin EC 81 MG tablet Take 1 tablet (81 mg total) by mouth daily.  Marland Kitchen atorvastatin (LIPITOR) 80 MG tablet TAKE 1 TABLET (80 MG TOTAL) BY MOUTH DAILY AT 6 PM.  . carvedilol (COREG) 6.25 MG tablet TAKE 1 TABLET BY MOUTH TWICE A DAY  . donepezil (ARICEPT) 10 MG tablet Take 1 tablet (10 mg total) by mouth at bedtime.  Marland Kitchen escitalopram (LEXAPRO) 10 MG tablet Take 10 mg by mouth daily.  . ferrous sulfate 324 MG TBEC Take 324 mg by mouth.  Marland Kitchen glucose blood (FREESTYLE LITE) test strip Check Blood Sugars 1-2 times per day. DX: E11.9.  . Lancets (FREESTYLE) lancets Check Blood Sugars 1-2 times per day. DX: E11.9  . levothyroxine (SYNTHROID) 75 MCG tablet TAKE 1 TABLET BY MOUTH EVERY DAY  . losartan (COZAAR) 100 MG tablet TAKE 1 TABLET BY MOUTH EVERY DAY  . metFORMIN (GLUCOPHAGE) 1000 MG tablet TAKE 1 TABLET BY MOUTH TWICE A DAY  . Multiple Vitamin (MULTIVITAMIN) tablet Take 1 tablet by mouth daily. Centrium Silver once daily   . vitamin B-12 (CYANOCOBALAMIN) 1000 MCG tablet Take 1,000 mcg by mouth daily.  . vitamin C (ASCORBIC ACID) 500 MG tablet Take 500 mg by mouth every evening.   Marland Kitchen EPINEPHrine 0.3 mg/0.3 mL IJ SOAJ injection Inject 0.3 mLs (0.3 mg total) into the muscle as needed for anaphylaxis. (Patient not taking: Reported on 11/25/2019)   No facility-administered encounter medications on file as of 11/25/2019.    Allergies (verified)  Food, Shellfish allergy, Prednisone, and Venomil honey bee venom [honey bee venom]   History: Past Medical History:  Diagnosis Date  . Acute blood loss anemia   . Arthritis   . Brain aneurysm 2018   followed by Dr [name]  . Cataract   . Chronic kidney disease    kidney stone- 40 years ago  . Coronary artery disease   . 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 Diabetes mellitus   . Diverticulitis   . GERD (gastroesophageal  reflux disease)   . Hx of abdominal aortic aneurysm 1998  . Hyperlipidemia   . Hypertension   . Lower GI bleed   . Myocardial infarction ([place])    2016  . Personal history of colonic polyps - adenomas 09/09/2013   Past Surgical History:  Procedure Laterality Date  . ABDOMINAL AORTIC ANEURYSM REPAIR  1998  . CARDIAC CATHETERIZATION N/A 03/18/2015   Procedure: Left Heart Cath and Coronary Angiography;  Surgeon: Peter M Martinique, MD;  Location: [facility];  Service: Cardiovascular;  Laterality: N/A;  . cataract surgery Right   . COLONOSCOPY    . CORONARY ARTERY BYPASS GRAFT N/A 03/18/2015   Procedure: CORONARY ARTERY BYPASS GRAFTING (CABG) x  three, using left internal mammary artery and right leg greater saphenous vein harvested endoscopically;  Surgeon: Melrose Nakayama, MD;  Location: [facility];  Service: Open Heart Surgery;  Laterality: N/A;  . INGUINAL HERNIA REPAIR Bilateral   . INTRAOPERATIVE TRANSESOPHAGEAL ECHOCARDIOGRAM N/A 03/18/2015   Procedure: INTRAOPERATIVE TRANSESOPHAGEAL ECHOCARDIOGRAM;  Surgeon: Melrose Nakayama, MD;  Location: [facility];  Service: Open Heart Surgery;  Laterality: N/A;  . open heart surgery  2016  . UMBILICAL HERNIA REPAIR  1965   Family History  Problem Relation Age of Onset  . Bone cancer Mother   . Diabetes Paternal Aunt   . Stroke Son   . Down syndrome Son   . Colon cancer Neg Hx   . Esophageal cancer Neg Hx   . Rectal cancer Neg Hx   . Stomach cancer Neg Hx    Social History   Socioeconomic History  . Marital status: Married    Spouse name: [name]  . Number of children: 3  . Years of education: Not on file  . Highest education level: Not on file  Occupational History  . Not on file  Tobacco Use  . Smoking status: Former Smoker    Packs/day: 1.50    Years: 20.00    Pack years: 30.00    Types: Cigarettes    Quit date: 08/24/1986    Years since quitting: 33.2  . Smokeless tobacco: Never Used  Vaping Use  . Vaping Use: Never  used  Substance and Sexual Activity  . Alcohol use: Not Currently    Comment: occ  . Drug use: No  . Sexual activity: Not on file  Other Topics Concern  . Not on file  Social History Narrative  . Not on file   Social Determinants of Health   Financial Resource Strain: Low Risk   . Difficulty of Paying Living Expenses: Not hard at all  Food Insecurity: No Food Insecurity  . Worried About Charity fundraiser in the Last Year: Never true  . Ran Out of Food in the Last Year: Never true  Transportation Needs: No Transportation Needs  . Lack of Transportation (Medical): No  . Lack of Transportation (Non-Medical): No  Physical Activity: Inactive  . Days of Exercise per Week: 0 days  .  Minutes of Exercise per Session: 0 min  Stress: No Stress Concern Present  . Feeling of Stress : Not at all  Social Connections: Socially Integrated  . Frequency of Communication with Friends and Family: More than three times a week  . Frequency of Social Gatherings with Friends and Family: More than three times a week  . Attends Religious Services: More than 4 times per year  . Active Member of Clubs or Organizations: Yes  . Attends Archivist Meetings: More than 4 times per year  . Marital Status: Married    Tobacco Counseling Counseling given: Not Answered   Clinical Intake:  Pre-visit preparation completed: Yes  Pain : No/denies pain     Nutritional Risks: None Diabetes: Yes (Patient states checks glucose occassionally) CBG done?: Yes CBG resulted in Enter/ Edit results?: No Did pt. bring in CBG monitor from home?: No  How often do you need to have someone help you when you read instructions, pamphlets, or other written materials from your doctor or pharmacy?: 1 - Never What is the last grade level you completed in school?: 11th grade  Diabetic?Yes  Interpreter Needed?: No  Information entered by :: [place] of Daily Living In your present state of  health, do you have any difficulty performing the following activities: 11/25/2019  Hearing? Y  Comment Has some difficulties hearing  Vision? N  Difficulty concentrating or making decisions? Y  Comment Has some issues remembering sometimes  Walking or climbing stairs? N  Dressing or bathing? N  Doing errands, shopping? N  Preparing Food and eating ? N  Using the Toilet? N  In the past six months, have you accidently leaked urine? N  Do you have problems with loss of bowel control? N  Managing your Medications? N  Managing your Finances? N  Housekeeping or managing your Housekeeping? N  Some recent data might be hidden    Patient Care Team: Eulas Post, MD as PCP - Huston Foley, MD as Consulting Physician (General Surgery) Gatha Mayer, MD as Consulting Physician (Gastroenterology) Martinique, Peter M, MD as Consulting Physician (Cardiology) Melrose Nakayama, MD as Consulting Physician (Cardiothoracic Surgery) Earnie Larsson, [facility] as Pharmacist (Pharmacist)  Indicate any recent Medical Services you may have received from other than [facility] providers in the past year (date may be approximate).     Assessment:   This is a routine wellness examination for [place].  Hearing/Vision screen  Hearing Screening   125Hz  250Hz  500Hz  1000Hz  2000Hz  3000Hz  4000Hz  6000Hz  8000Hz   Right ear:           Left ear:           Vision Screening Comments: Patient states gets eyes checked every year   Dietary issues and exercise activities discussed: Current Exercise Habits: The patient does not participate in regular exercise at present, Exercise limited by: cardiac condition(s)  Goals    . Patient Stated     [place] more time there. Built a ramp and need to take off!  Try to make your walks!       Depression Screen PHQ 2/9 Scores 11/25/2019 09/15/2019 09/11/2017 12/12/2016 08/03/2015 06/13/2015 05/02/2015  PHQ - 2 Score 0 0 0 0 0 0 0    PHQ- 9 Score 0 - - - - - -    Fall Risk Fall Risk  11/25/2019 06/15/2019 09/11/2017 03/14/2017 04/23/2016  Falls in the past year? 0 1 No  No No  Number falls in past yr: 0 0 - - -  Injury with Fall? 0 1 - - -  Risk for fall due to : Medication side effect History of fall(s) - - -  Follow up Falls evaluation completed;Falls prevention discussed Falls evaluation completed - - -    Any stairs in or around the home? Yes  If so, are there any without handrails? No  Home free of loose throw rugs in walkways, pet beds, electrical cords, etc? Yes  Adequate lighting in your home to reduce risk of falls? Yes   ASSISTIVE DEVICES UTILIZED TO PREVENT FALLS:  Life alert? No  Use of a cane, walker or w/c? No  Grab bars in the bathroom? Yes  Shower chair or bench in shower? Yes  Elevated toilet seat or a handicapped toilet? No     Cognitive Function: MMSE - Mini Mental State Exam 09/11/2017  Not completed: (No Data)     6CIT Screen 11/25/2019  What Year? 0 points  What month? 0 points  What time? 0 points  Count back from 20 2 points  Months in reverse 4 points  Repeat phrase 10 points  Total Score 16    Immunizations Immunization History  Administered Date(s) Administered  . Fluad Quad(high Dose 65+) 12/25/2018, 12/25/2018  . Influenza Split 01/15/2011  . Influenza, High Dose Seasonal PF 12/12/2016, 02/06/2018  . Influenza,inj,Quad PF,6+ Mos 11/18/2013, 01/07/2016  . Influenza-Unspecified 02/06/2018  . PFIZER SARS-COV-2 Vaccination 05/31/2019, 06/30/2019  . Pneumococcal Conjugate-13 05/21/2014  . Pneumococcal Polysaccharide-23 11/18/2012  . Tdap 02/16/2019  . Zoster 05/21/2014    TDAP status: Up to date Flu Vaccine status: Up to date Pneumococcal vaccine status: Up to date Covid-19 vaccine status: Completed vaccines  Qualifies for Shingles Vaccine? Yes   Zostavax completed Yes   Shingrix Completed?: No.    Education has been provided regarding the importance of this  vaccine. Patient has been advised to call insurance company to determine out of pocket expense if they have not yet received this vaccine. Advised may also receive vaccine at local pharmacy or Health Dept. Verbalized acceptance and understanding.  Screening Tests Health Maintenance  Topic Date Due  . Hepatitis C Screening  Never done  . OPHTHALMOLOGY EXAM  07/13/2015  . FOOT EXAM  09/12/2018  . INFLUENZA VACCINE  11/01/2019  . HEMOGLOBIN A1C  03/16/2020  . TETANUS/TDAP  02/15/2029  . COVID-19 Vaccine  Completed  . PNA vac Low Risk Adult  Completed    Health Maintenance  Health Maintenance Due  Topic Date Due  . Hepatitis C Screening  Never done  . OPHTHALMOLOGY EXAM  07/13/2015  . FOOT EXAM  09/12/2018  . INFLUENZA VACCINE  11/01/2019    Colorectal cancer screening: No longer required.   Lung Cancer Screening: (Low Dose CT Chest recommended if Age 51-80 years, 30 pack-year currently smoking OR have quit w/in 15years.) does not qualify.   Lung Cancer Screening Referral: N/A  Additional Screening:  Hepatitis C Screening: does not qualify;   Vision Screening: Recommended annual ophthalmology exams for early detection of glaucoma and other disorders of the eye. Is the patient up to date with their annual eye exam?  Yes  Who is the provider or what is the name of the office in which the patient attends annual eye exams? [facility]  If pt is not established with a provider, would they like to be referred to a provider to establish care? No .   Dental  Screening: Recommended annual dental exams for proper oral hygiene  Community Resource Referral / Chronic Care Management: CRR required this visit?  No   CCM required this visit?  No      Plan:     I have personally reviewed and noted the following in the patient's chart:   . Medical and social history . Use of alcohol, tobacco or illicit drugs  . Current medications and supplements . Functional ability and  status . Nutritional status . Physical activity . Advanced directives . List of other physicians . Hospitalizations, surgeries, and ER visits in previous 12 months . Vitals . Screenings to include cognitive, depression, and falls . Referrals and appointments  In addition, I have reviewed and discussed with patient certain preventive protocols, quality metrics, and best practice recommendations. A written personalized care plan for preventive services as well as general preventive health recommendations were provided to patient.     Ofilia Neas, LPN   [id number]   Nurse Notes: None

## 2019-11-29 ENCOUNTER — Other Ambulatory Visit: Payer: Self-pay | Admitting: Family Medicine

## 2019-12-01 NOTE — Telephone Encounter (Signed)
Refill for 6 months. 

## 2019-12-15 ENCOUNTER — Other Ambulatory Visit: Payer: Self-pay

## 2019-12-16 ENCOUNTER — Ambulatory Visit (INDEPENDENT_AMBULATORY_CARE_PROVIDER_SITE_OTHER): Payer: PPO | Admitting: Family Medicine

## 2019-12-16 ENCOUNTER — Encounter: Payer: Self-pay | Admitting: Family Medicine

## 2019-12-16 VITALS — BP 118/68 | HR 72 | Temp 98.3°F | Ht 68.0 in | Wt 153.0 lb

## 2019-12-16 DIAGNOSIS — E039 Hypothyroidism, unspecified: Secondary | ICD-10-CM | POA: Diagnosis not present

## 2019-12-16 DIAGNOSIS — I10 Essential (primary) hypertension: Secondary | ICD-10-CM | POA: Diagnosis not present

## 2019-12-16 DIAGNOSIS — B351 Tinea unguium: Secondary | ICD-10-CM | POA: Diagnosis not present

## 2019-12-16 DIAGNOSIS — Z23 Encounter for immunization: Secondary | ICD-10-CM

## 2019-12-16 DIAGNOSIS — E1121 Type 2 diabetes mellitus with diabetic nephropathy: Secondary | ICD-10-CM

## 2019-12-16 DIAGNOSIS — R2689 Other abnormalities of gait and mobility: Secondary | ICD-10-CM

## 2019-12-16 DIAGNOSIS — E7800 Pure hypercholesterolemia, unspecified: Secondary | ICD-10-CM | POA: Diagnosis not present

## 2019-12-16 MED ORDER — TERBINAFINE HCL 250 MG PO TABS: 250.0000 mg | ORAL_TABLET | Freq: Every day | ORAL | 0 refills | Status: DC

## 2019-12-16 NOTE — Patient Instructions (Signed)

## 2019-12-16 NOTE — Progress Notes (Signed)
Established Patient Office Visit  Subjective:  Patient ID: Miguel Vazquez, male    DOB: 01-Sep-1940  Age: 79 y.o. MRN: [id number]  CC:  Chief Complaint  Patient presents with  . Follow-up    3 month f/u    HPI DEMOND SHALLENBERGER presents for medical follow-up.  He has history of CAD, hypertension, hypothyroidism, type 2 diabetes, history of recurrent depression.  He has some cognitive impairment.  His accompanied by wife today.  He continues to feel "off balance" but has not had any falls since last visit.  Fall several months ago with intertrochanteric fracture which healed uneventfully without intervention   They had declined physical therapy previously but state they would be willing to reconsider at this time.  He has fungal changes right great toe.  Progressive over the past year.  They would like to consider treatment.  Denies any recent chest pains.  Medications reviewed.  Compliant with all.  Due for follow-up labs.  No lipids in 1 year.  Goal LDL less than 70.  Still needs flu vaccine  Past Medical History:  Diagnosis Date  . Acute blood loss anemia   . Arthritis   . Brain aneurysm 2018   followed by Dr [name]  . Cataract   . Chronic kidney disease    kidney stone- 40 years ago  . Coronary artery disease   . 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 Diabetes mellitus   . Diverticulitis   . GERD (gastroesophageal reflux disease)   . Hx of abdominal aortic aneurysm 1998  . Hyperlipidemia   . Hypertension   . Lower GI bleed   . Myocardial infarction ([place])    2016  . Personal history of colonic polyps - adenomas 09/09/2013    Past Surgical History:  Procedure Laterality Date  . ABDOMINAL AORTIC ANEURYSM REPAIR  1998  . CARDIAC CATHETERIZATION N/A 03/18/2015   Procedure: Left Heart Cath and Coronary Angiography;  Surgeon: Peter M Martinique, MD;  Location: [facility];  Service: Cardiovascular;  Laterality: N/A;  . cataract surgery Right   . COLONOSCOPY    . CORONARY ARTERY BYPASS  GRAFT N/A 03/18/2015   Procedure: CORONARY ARTERY BYPASS GRAFTING (CABG) x  three, using left internal mammary artery and right leg greater saphenous vein harvested endoscopically;  Surgeon: Melrose Nakayama, MD;  Location: [facility];  Service: Open Heart Surgery;  Laterality: N/A;  . INGUINAL HERNIA REPAIR Bilateral   . INTRAOPERATIVE TRANSESOPHAGEAL ECHOCARDIOGRAM N/A 03/18/2015   Procedure: INTRAOPERATIVE TRANSESOPHAGEAL ECHOCARDIOGRAM;  Surgeon: Melrose Nakayama, MD;  Location: [facility];  Service: Open Heart Surgery;  Laterality: N/A;  . open heart surgery  2016  . UMBILICAL HERNIA REPAIR  1965    Family History  Problem Relation Age of Onset  . Bone cancer Mother   . Diabetes Paternal Aunt   . Stroke Son   . Down syndrome Son   . Colon cancer Neg Hx   . Esophageal cancer Neg Hx   . Rectal cancer Neg Hx   . Stomach cancer Neg Hx     Social History   Socioeconomic History  . Marital status: Married    Spouse name: [name]  . Number of children: 3  . Years of education: Not on file  . Highest education level: Not on file  Occupational History  . Not on file  Tobacco Use  . Smoking status: Former Smoker    Packs/day: 1.50    Years: 20.00    Pack  years: 30.00    Types: Cigarettes    Quit date: 08/24/1986    Years since quitting: 33.3  . Smokeless tobacco: Never Used  Vaping Use  . Vaping Use: Never used  Substance and Sexual Activity  . Alcohol use: Not Currently    Comment: occ  . Drug use: No  . Sexual activity: Not on file  Other Topics Concern  . Not on file  Social History Narrative  . Not on file   Social Determinants of Health   Financial Resource Strain: Low Risk   . Difficulty of Paying Living Expenses: Not hard at all  Food Insecurity: No Food Insecurity  . Worried About Charity fundraiser in the Last Year: Never true  . Ran Out of Food in the Last Year: Never true  Transportation Needs: No Transportation Needs  . Lack of Transportation (Medical):  No  . Lack of Transportation (Non-Medical): No  Physical Activity: Inactive  . Days of Exercise per Week: 0 days  . Minutes of Exercise per Session: 0 min  Stress: No Stress Concern Present  . Feeling of Stress : Not at all  Social Connections: Socially Integrated  . Frequency of Communication with Friends and Family: More than three times a week  . Frequency of Social Gatherings with Friends and Family: More than three times a week  . Attends Religious Services: More than 4 times per year  . Active Member of Clubs or Organizations: Yes  . Attends Archivist Meetings: More than 4 times per year  . Marital Status: Married  Human resources officer Violence: Not At Risk  . Fear of Current or Ex-Partner: No  . Emotionally Abused: No  . Physically Abused: No  . Sexually Abused: No    Outpatient Medications Prior to Visit  Medication Sig Dispense Refill  . aspirin EC 81 MG tablet Take 1 tablet (81 mg total) by mouth daily. 90 tablet 3  . atorvastatin (LIPITOR) 80 MG tablet TAKE 1 TABLET (80 MG TOTAL) BY MOUTH DAILY AT 6 PM. 90 tablet 2  . carvedilol (COREG) 6.25 MG tablet TAKE 1 TABLET BY MOUTH TWICE A DAY 180 tablet 3  . donepezil (ARICEPT) 10 MG tablet Take 1 tablet (10 mg total) by mouth at bedtime. 90 tablet 3  . EPINEPHrine 0.3 mg/0.3 mL IJ SOAJ injection Inject 0.3 mLs (0.3 mg total) into the muscle as needed for anaphylaxis. 2 each 3  . escitalopram (LEXAPRO) 10 MG tablet TAKE 1 TABLET BY MOUTH EVERY DAY 90 tablet 2  . ferrous sulfate 324 MG TBEC Take 324 mg by mouth.    Marland Kitchen glucose blood (FREESTYLE LITE) test strip Check Blood Sugars 1-2 times per day. DX: E11.9. 100 each 5  . Lancets (FREESTYLE) lancets Check Blood Sugars 1-2 times per day. DX: E11.9 100 each 5  . levothyroxine (SYNTHROID) 75 MCG tablet TAKE 1 TABLET BY MOUTH EVERY DAY 90 tablet 1  . losartan (COZAAR) 100 MG tablet TAKE 1 TABLET BY MOUTH EVERY DAY 90 tablet 1  . metFORMIN (GLUCOPHAGE) 1000 MG tablet TAKE 1  TABLET BY MOUTH TWICE A DAY 180 tablet 3  . Multiple Vitamin (MULTIVITAMIN) tablet Take 1 tablet by mouth daily. Centrium Silver once daily     . vitamin B-12 (CYANOCOBALAMIN) 1000 MCG tablet Take 1,000 mcg by mouth daily.    . vitamin C (ASCORBIC ACID) 500 MG tablet Take 500 mg by mouth every evening.     . donepezil (ARICEPT) 5 MG tablet TAKE  1 TABLET BY MOUTH EVERYDAY AT BEDTIME 30 tablet 6   No facility-administered medications prior to visit.    Allergies  Allergen Reactions  . Food Anaphylaxis    TREE NUTS  . Shellfish Allergy Anaphylaxis  . Prednisone     "psychosis"  . Venomil Honey Bee Venom [Honey Bee Venom] Swelling    Localized swelling    ROS Review of Systems  Constitutional: Negative for fatigue and unexpected weight change.  Eyes: Negative for visual disturbance.  Respiratory: Negative for cough, chest tightness and shortness of breath.   Cardiovascular: Negative for chest pain, palpitations and leg swelling.  Neurological: Positive for dizziness. Negative for syncope, weakness, light-headedness and headaches.      Objective:    Physical Exam Constitutional:      Appearance: He is well-developed.  HENT:     Right Ear: External ear normal.     Left Ear: External ear normal.  Eyes:     Pupils: Pupils are equal, round, and reactive to light.  Neck:     Thyroid: No thyromegaly.  Cardiovascular:     Rate and Rhythm: Normal rate and regular rhythm.  Pulmonary:     Effort: Pulmonary effort is normal. No respiratory distress.     Breath sounds: Normal breath sounds. No wheezing or rales.  Musculoskeletal:     Cervical back: Neck supple.  Skin:    Comments: He has thickened and brittle changes right great toenail consistent with likely onychomycosis  Neurological:     Mental Status: He is alert and oriented to person, place, and time.     BP 118/68 (BP Location: Left Arm, Cuff Size: Normal)   Pulse 72   Temp 98.3 F (36.8 C) (Oral)   Ht 5\' 8"  (1.727  m)   Wt 153 lb (69.4 kg)   SpO2 97%   BMI 23.26 kg/m  Wt Readings from Last 3 Encounters:  12/16/19 153 lb (69.4 kg)  09/15/19 149 lb 9.6 oz (67.9 kg)  08/05/19 153 lb (69.4 kg)     Health Maintenance Due  Topic Date Due  . Hepatitis C Screening  Never done  . OPHTHALMOLOGY EXAM  07/13/2015  . FOOT EXAM  09/12/2018    There are no preventive care reminders to display for this patient.  Lab Results  Component Value Date   TSH 2.59 02/09/2019   Lab Results  Component Value Date   WBC 7.7 09/23/2019   HGB 13.0 09/23/2019   HCT 38.7 (L) 09/23/2019   MCV 93.1 09/23/2019   PLT 138.0 (L) 09/23/2019   Lab Results  Component Value Date   NA 140 06/26/2019   K 4.0 06/26/2019   CO2 29 06/26/2019   GLUCOSE 129 (H) 06/26/2019   BUN 17 06/26/2019   CREATININE 0.99 06/26/2019   BILITOT 0.5 06/24/2019   ALKPHOS 67 06/24/2019   AST 22 06/24/2019   ALT 23 06/24/2019   PROT 6.6 06/24/2019   ALBUMIN 3.6 06/24/2019   CALCIUM 9.6 06/26/2019   ANIONGAP 10 06/26/2019   GFR 66.26 04/15/2019   Lab Results  Component Value Date   CHOL 117 11/07/2018   Lab Results  Component Value Date   HDL 39.50 11/07/2018   Lab Results  Component Value Date   LDLCALC 59 11/07/2018   Lab Results  Component Value Date   TRIG 97.0 11/07/2018   Lab Results  Component Value Date   CHOLHDL 3 11/07/2018   Lab Results  Component Value Date   HGBA1C 5.9 (  A) 09/15/2019      Assessment & Plan:   #1 type 2 diabetes.  History of good control.  Recheck A1c today.  If still well controlled we will reduce his Metformin dose  #2 hypertension stable.  I obtain reading left arm seated 118/68 and identical reading standing  #3 hypothyroidism -Recheck TSH  #4 dyslipidemia.  Goal LDL less than 70 -Recheck lipid and hepatic panel  #5 onychomycosis right great toenail -Check hepatic panel.  If normal they will fill prescription for Lamisil 250 mg daily for 90 days  #6 balance issues. -Set  up outpatient physical therapy to help hopefully reduce his fall risk  Meds ordered this encounter  Medications  . terbinafine (LAMISIL) 250 MG tablet    Sig: Take 1 tablet (250 mg total) by mouth daily.    Dispense:  90 tablet    Refill:  0    Follow-up: Return in about 3 months (around 03/16/2020).    Carolann Littler, MD

## 2019-12-17 LAB — HEPATIC FUNCTION PANEL
AG Ratio: 1.9 (calc) (ref 1.0–2.5)
ALT: 21 U/L (ref 9–46)
AST: 19 U/L (ref 10–35)
Albumin: 4.3 g/dL (ref 3.6–5.1)
Alkaline phosphatase (APISO): 58 U/L (ref 35–144)
Bilirubin, Direct: 0.2 mg/dL (ref 0.0–0.2)
Globulin: 2.3 g/dL (calc) (ref 1.9–3.7)
Indirect Bilirubin: 0.6 mg/dL (calc) (ref 0.2–1.2)
Total Bilirubin: 0.8 mg/dL (ref 0.2–1.2)
Total Protein: 6.6 g/dL (ref 6.1–8.1)

## 2019-12-17 LAB — LIPID PANEL
Cholesterol: 125 mg/dL (ref <200)
HDL: 45 mg/dL (ref >=40)
LDL Cholesterol (Calc): 60 mg/dL (calc)
Non-HDL Cholesterol (Calc): 80 mg/dL (calc) (ref <130)
Total CHOL/HDL Ratio: 2.8 (calc) (ref <5.0)
Triglycerides: 117 mg/dL (ref <150)

## 2019-12-17 LAB — HEMOGLOBIN A1C
Hgb A1c MFr Bld: 6.3 % of total Hgb — ABNORMAL HIGH (ref <5.7)
Mean Plasma Glucose: 134 (calc)
eAG (mmol/L): 7.4 (calc)

## 2019-12-17 LAB — BASIC METABOLIC PANEL
BUN: 22 mg/dL (ref 7–25)
CO2: 29 mmol/L (ref 20–32)
Calcium: 10.1 mg/dL (ref 8.6–10.3)
Chloride: 104 mmol/L (ref 98–110)
Creat: 1.16 mg/dL (ref 0.70–1.18)
Glucose, Bld: 85 mg/dL (ref 65–99)
Potassium: 4.8 mmol/L (ref 3.5–5.3)
Sodium: 141 mmol/L (ref 135–146)

## 2019-12-17 LAB — TSH: TSH: 1.88 mIU/L (ref 0.40–4.50)

## 2020-01-01 ENCOUNTER — Telehealth: Payer: Self-pay

## 2020-01-01 DIAGNOSIS — K862 Cyst of pancreas: Secondary | ICD-10-CM

## 2020-01-01 NOTE — Telephone Encounter (Signed)
-----   Message from Marlon Pel, RN sent at 07/23/2019  4:57 PM EDT ----- He needs:  1) repeat MR/MRCP as we did to f/u cyst - 6 mos = 10/21  2) I ordered CBC for 5/3 week - she knows to come  3) no need to call them now     See results from [place] on 4/22

## 2020-01-01 NOTE — Telephone Encounter (Signed)
MRCP has been scheduled at [facility] for 01/22/20 3:00.  Patient will need to be NPO for 4 hours prior  Left message for patient to call back

## 2020-01-01 NOTE — Telephone Encounter (Signed)
Left message for patient to call back  

## 2020-01-01 NOTE — Telephone Encounter (Signed)
Patients wife returned the call advise her of the upcoming appt but she requested to speak with you

## 2020-01-04 DIAGNOSIS — R2689 Other abnormalities of gait and mobility: Secondary | ICD-10-CM | POA: Diagnosis not present

## 2020-01-04 NOTE — Telephone Encounter (Signed)
Left message for patient to call back  

## 2020-01-05 NOTE — Telephone Encounter (Signed)
All questions answered for his wife.  They will keep the appt for 10/22

## 2020-01-07 DIAGNOSIS — R2689 Other abnormalities of gait and mobility: Secondary | ICD-10-CM | POA: Diagnosis not present

## 2020-01-11 DIAGNOSIS — R2689 Other abnormalities of gait and mobility: Secondary | ICD-10-CM | POA: Diagnosis not present

## 2020-01-18 DIAGNOSIS — R2689 Other abnormalities of gait and mobility: Secondary | ICD-10-CM | POA: Diagnosis not present

## 2020-01-21 DIAGNOSIS — R2689 Other abnormalities of gait and mobility: Secondary | ICD-10-CM | POA: Diagnosis not present

## 2020-01-22 ENCOUNTER — Other Ambulatory Visit: Payer: Self-pay | Admitting: Internal Medicine

## 2020-01-22 ENCOUNTER — Ambulatory Visit (HOSPITAL_COMMUNITY)
Admission: RE | Admit: 2020-01-22 | Discharge: 2020-01-22 | Disposition: A | Discharge status: Home / Self Care | Payer: PPO | Source: Ambulatory Visit | Attending: Internal Medicine | Admitting: Internal Medicine

## 2020-01-22 ENCOUNTER — Other Ambulatory Visit: Payer: Self-pay

## 2020-01-22 DIAGNOSIS — K8689 Other specified diseases of pancreas: Secondary | ICD-10-CM | POA: Diagnosis not present

## 2020-01-22 DIAGNOSIS — R935 Abnormal findings on diagnostic imaging of other abdominal regions, including retroperitoneum: Secondary | ICD-10-CM | POA: Diagnosis not present

## 2020-01-22 DIAGNOSIS — K862 Cyst of pancreas: Secondary | ICD-10-CM | POA: Diagnosis not present

## 2020-01-22 IMAGING — MR MR ABDOMEN WO/W CM MRCP
17 of 23 series · 35 of 48 positions shown · IV contrast (gadavist)
Comparison: Abdominal MRI [DATE], abdominal CTA [DATE] and
abdominal CT [DATE].

CLINICAL DATA: Follow-up pancreatic cystic lesion.

EXAM:
MRI ABDOMEN WITHOUT AND WITH CONTRAST (INCLUDING MRCP)
TECHNIQUE: Multiplanar multisequence MR imaging of the abdomen was performed
both before and after the administration of intravenous contrast.
Heavily T2-weighted images of the biliary and pancreatic ducts were
obtained, and three-dimensional MRCP images were rendered by post
processing.
CONTRAST:  7mL GADAVIST GADOBUTROL 1 MMOL/ML IV SOLN

[Series 3: T2 fat-sat · axial · 6.0mm · 1.14mm/px · 1 of 37 slices shown]
[im 1/37]
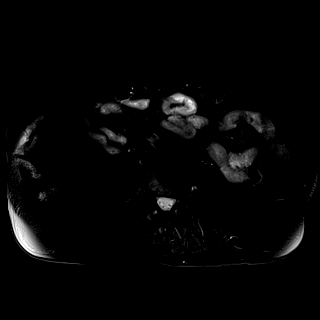

[Series 5: DWI · axial · 6.0mm · 1.37mm/px · 1 of 74 slices shown (1 of 2)]
[im 1/74]
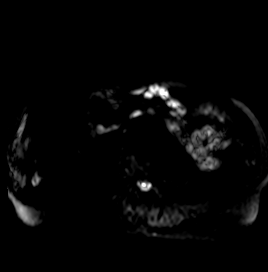

[Series 6: DWI · axial · 6.0mm · 1.37mm/px · 1 of 37 slices shown (2 of 2)]
[im 1/37]
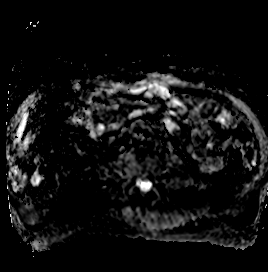

[Series 7: T1 · axial · 3.0mm · 1.14mm/px · z∈[-231,+30]mm · 2 of 88 slices shown (1 of 2)]
[im 1/88]
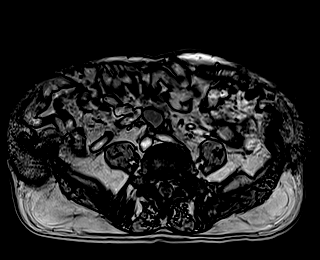
[im 88/88]
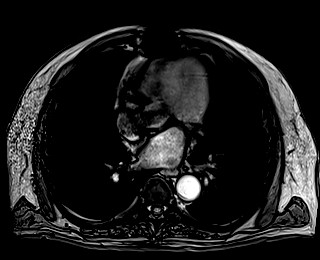

[Series 8: T1 · axial · 3.0mm · 1.14mm/px · z∈[-231,+30]mm · 2 of 88 slices shown (2 of 2)]
[im 1/88]
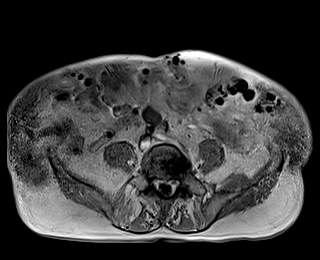
[im 88/88]
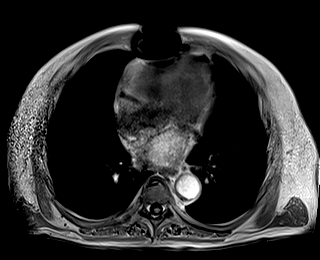

[Series 9: bSSFP · axial · 7.0mm · 1.14mm/px · 1 of 30 slices shown]
[im 1/30]
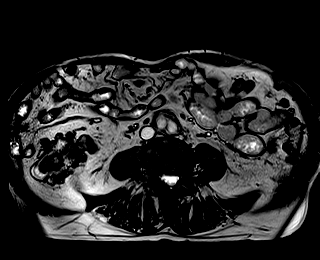

[Series 10: T2 · axial · 6.0mm · 1.43mm/px · 1 of 36 slices shown (1 of 2)]
[im 1/36]
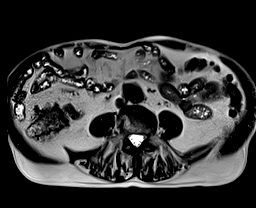

[Series 12: T2 · coronal · 6.0mm · 1.43mm/px · 1 of 34 slices shown (2 of 2)]
[im 1/34]
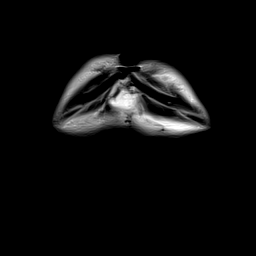

[Series 14: cor_3d_spc_trig · coronal · 1.0mm · 0.49mm/px · 3 of 88 slices shown]
[im 1/88]
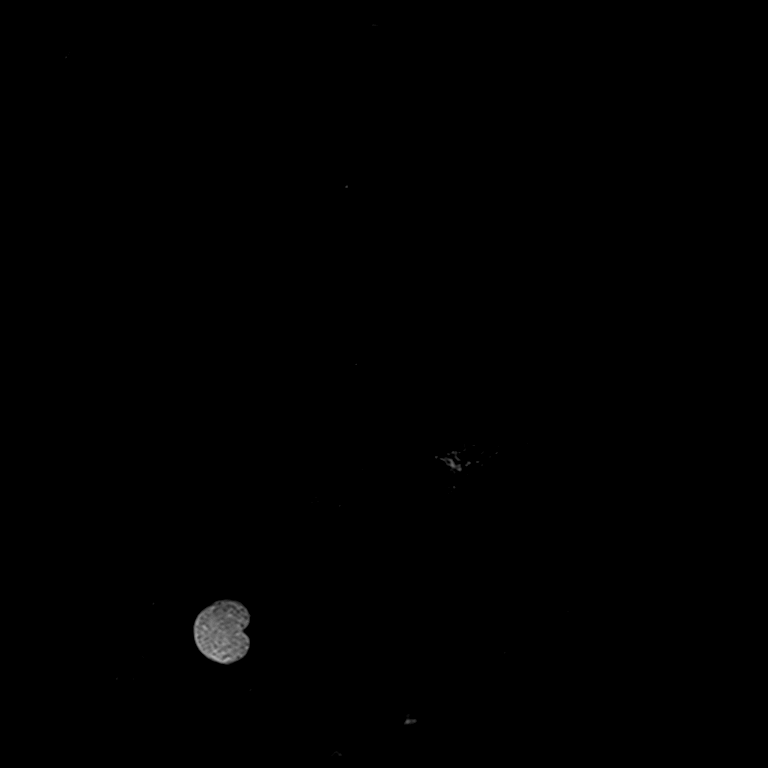
[im 44/88]
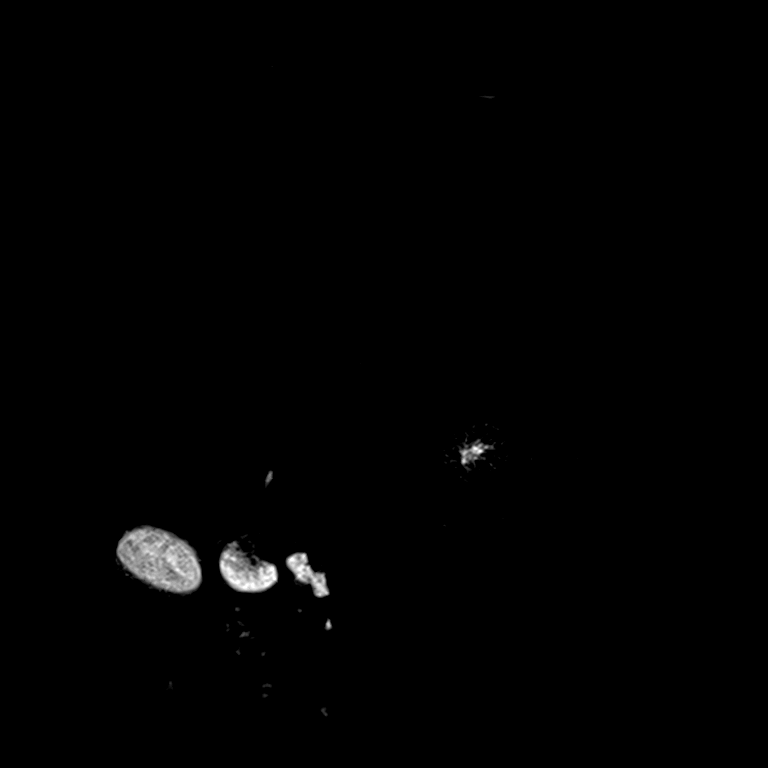
[im 88/88]
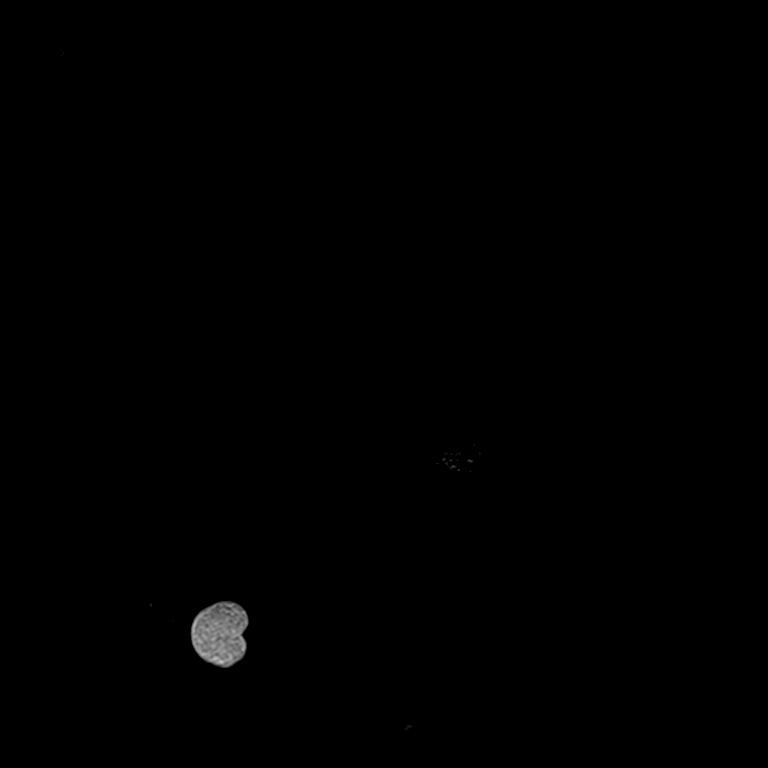

[Series 16: cor obl thk · sagittal · 50.0mm · 0.78mm/px · 1 of 9 slices shown]
[im 1/9]
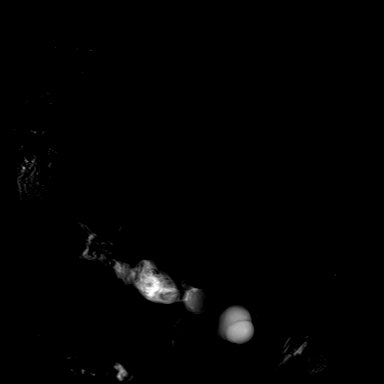

[Series 18: T1 dynamic · axial · 3.0mm · 1.14mm/px · z∈[-219,+42]mm · 3 of 88 slices shown (1 of 6)]
[im 1/88]
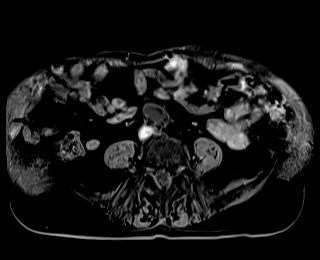
[im 44/88]
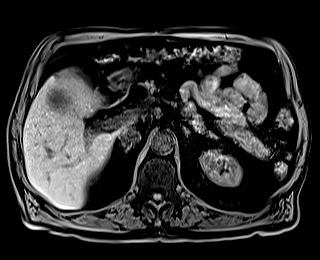
[im 88/88]
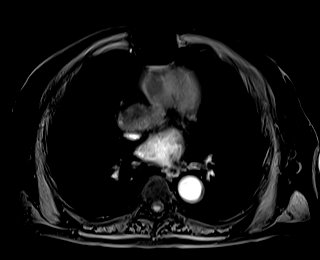

[Series 21: T1 dynamic · axial · 3.0mm · 1.14mm/px · z∈[-219,+42]mm · 3 of 88 slices shown (2 of 6)]
[im 1/88]
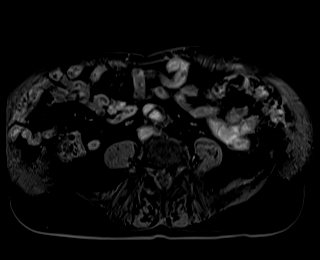
[im 44/88]
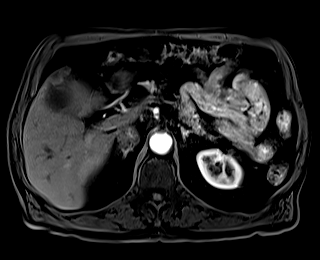
[im 88/88]
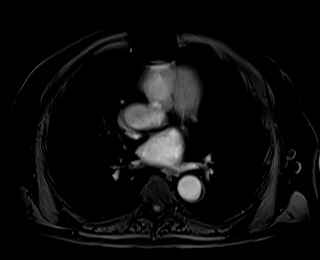

[Series 24: T1 dynamic · axial · 3.0mm · 1.14mm/px · z∈[-219,+42]mm · 3 of 88 slices shown (3 of 6)]
[im 1/88]
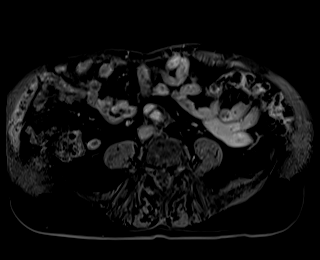
[im 44/88]
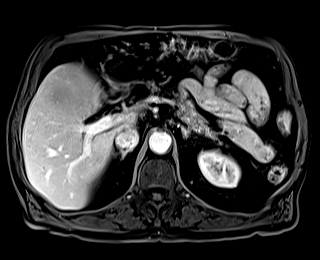
[im 88/88]
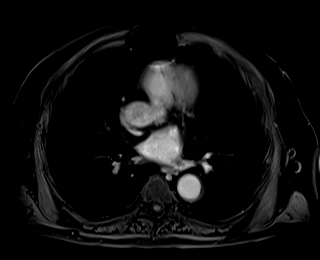

[Series 26: T1 dynamic · axial · 3.0mm · 1.14mm/px · z∈[-219,+42]mm · 3 of 88 slices shown (4 of 6)]
[im 1/88]
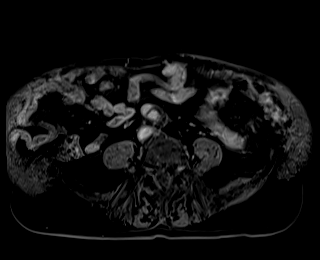
[im 44/88]
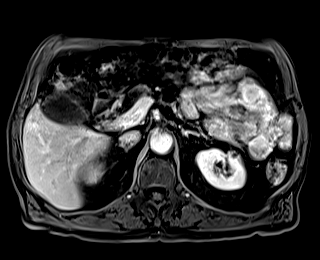
[im 88/88]
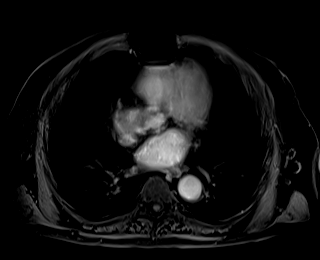

[Series 28: T1 dynamic · coronal · 3.0mm · 1.14mm/px · 3 of 80 slices shown (5 of 6)]
[im 1/80]
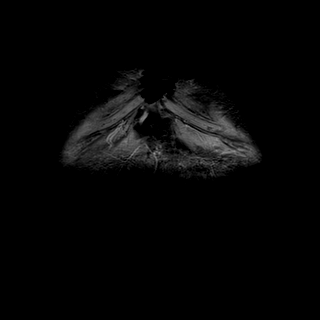
[im 40/80]
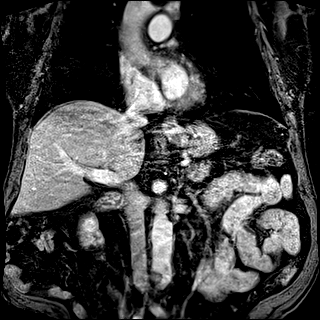
[im 80/80]
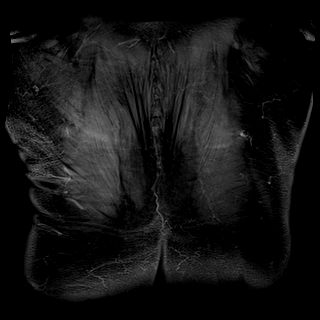

[Series 30: T1 dynamic · axial · 3.0mm · 1.14mm/px · z∈[-219,+42]mm · 3 of 88 slices shown (6 of 6)]
[im 1/88]
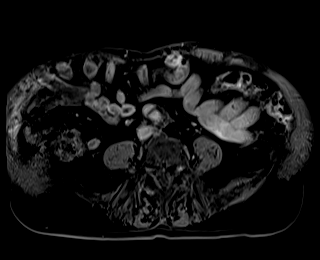
[im 44/88]
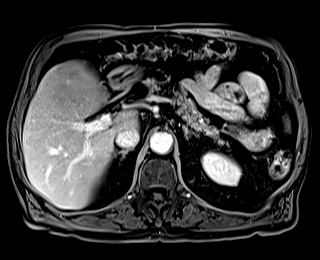
[im 88/88]
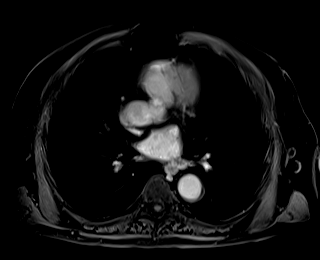

[Series 102: 20 sec sub · axial · 3.0mm · 1.14mm/px · z∈[-219,+42]mm · 3 of 88 slices shown]
[im 1/88]
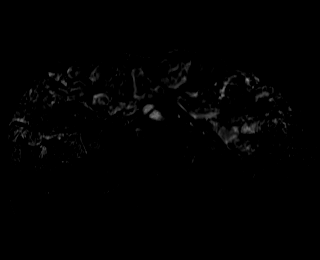
[im 44/88]
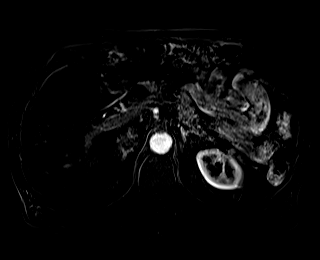
[im 88/88]
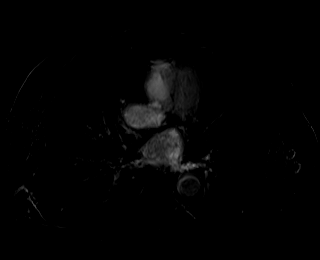

[35 of 48 positions shown; findings below may reference images not displayed]

FINDINGS: Lower chest: The visualized lower chest appears unremarkable status
post median sternotomy.

Hepatobiliary: Stable nonenhancing cysts within the left hepatic
lobe. No enhancing mass. No evidence of gallstones, gallbladder wall
thickening or biliary dilatation.

Pancreas: Again demonstrated is a multilocular cystic mass within
the pancreatic head. This lesion has mildly enlarged compared with
the prior study. Inferolateral septated component measures 2.2 x
cm on image [DATE] (previously 2.0 x 1.8 cm). A relatively simple
component more superiorly measures up to 2.8 cm on coronal image
43/11 (previously 2.6 cm). This lesion shows no definite
communication with the main pancreatic duct which is normal in
caliber. There is no significant enhancement of this lesion
following contrast.

Spleen: Normal in size without focal abnormality.

Adrenals/Urinary Tract: Both adrenal glands appear normal. Stable
bilateral renal cysts and cortical scarring in the lower pole of the
right kidney. No hydronephrosis.

Stomach/Bowel: No evidence of bowel wall thickening, distention or
surrounding inflammatory change.Colonic diverticular changes are
noted.

Vascular/Lymphatic: There are no enlarged abdominal lymph nodes.
Status post aorto bi-iliac grafting. No acute vascular findings.

Other: No ascites.

Musculoskeletal: No acute or significant osseous findings.
IMPRESSION: 1. Mild enlargement of multilocular cystic mass within the
pancreatic head compared with the prior study. This mass
demonstrates no abnormal enhancement or other aggressive features,
and no definite communication with the main pancreatic duct. This
lesion is new compared with the remote abdominal CT of [7N] and
remains most consistent with a cystic pancreatic neoplasm, likely a
side branch intraductal papillary mucinous neoplasm. Given interval
growth, consider further evaluation with endoscopic ultrasound and
fine needle aspiration. Continued follow-up by MRI in 6 months
recommended. This recommendation follows ACR consensus guidelines:
Management of Incidental Pancreatic Cysts: A White Paper of the ACR
Incidental Findings Committee. [HOSPITAL] [7N];[DATE].
2. No acute findings.
3. Stable bilateral renal cysts and cortical scarring in the lower
pole of the right kidney.

## 2020-01-22 MED ORDER — GADOBUTROL 1 MMOL/ML IV SOLN
7.0000 mL | Freq: Once | INTRAVENOUS | Status: AC | PRN
  Administered 2020-01-22: 7 mL via INTRAVENOUS

## 2020-01-25 ENCOUNTER — Encounter: Payer: Self-pay | Admitting: Internal Medicine

## 2020-01-27 DIAGNOSIS — R2689 Other abnormalities of gait and mobility: Secondary | ICD-10-CM | POA: Diagnosis not present

## 2020-02-02 ENCOUNTER — Encounter: Payer: Self-pay | Admitting: Internal Medicine

## 2020-02-05 ENCOUNTER — Telehealth: Payer: Self-pay | Admitting: Internal Medicine

## 2020-02-19 DIAGNOSIS — S20222A Contusion of left back wall of thorax, initial encounter: Secondary | ICD-10-CM | POA: Diagnosis not present

## 2020-02-22 ENCOUNTER — Encounter: Payer: Self-pay | Admitting: Family Medicine

## 2020-02-22 ENCOUNTER — Other Ambulatory Visit: Payer: Self-pay

## 2020-02-22 ENCOUNTER — Ambulatory Visit (INDEPENDENT_AMBULATORY_CARE_PROVIDER_SITE_OTHER): Payer: PPO | Admitting: Family Medicine

## 2020-02-22 VITALS — BP 136/72 | HR 76 | Temp 97.8°F | Ht 68.0 in | Wt 149.5 lb

## 2020-02-22 DIAGNOSIS — M546 Pain in thoracic spine: Secondary | ICD-10-CM | POA: Diagnosis not present

## 2020-02-22 DIAGNOSIS — R296 Repeated falls: Secondary | ICD-10-CM

## 2020-02-22 DIAGNOSIS — R9389 Abnormal findings on diagnostic imaging of other specified body structures: Secondary | ICD-10-CM

## 2020-02-22 NOTE — Patient Instructions (Signed)
Rib Contusion A rib contusion is a deep bruise on your rib area. Contusions are the result of a blunt trauma that causes bleeding and injury to the tissues under the skin. A rib contusion may involve bruising of the ribs and of the skin and muscles in the area. The skin over the contusion may turn blue, purple, or yellow. Minor injuries will give you a painless contusion. More severe contusions may stay painful and swollen for a few weeks. What are the causes? This condition is usually caused by a blow, trauma, or direct force to an area of the body. This often occurs while playing contact sports. What are the signs or symptoms? Symptoms of this condition include:  Swelling and redness of the injured area.  Discoloration of the injured area.  Tenderness and soreness of the injured area.  Pain with or without movement. How is this diagnosed? This condition may be diagnosed based on:  Your symptoms and medical history.  A physical exam.  Imaging tests--such as an X-ray, CT scan, or MRI--to determine if there were internal injuries or broken bones (fractures). How is this treated? This condition may be treated with:  Rest. This is often the best treatment for a rib contusion.  Icing. This reduces swelling and inflammation.  Deep-breathing exercises. These may be recommended to reduce the risk for lung collapse and pneumonia.  Medicines. Over-the-counter or prescription medicines may be given to control pain.  Injection of a numbing medicine around the nerve near your injury (nerve block). Follow these instructions at home:     Medicines  Take over-the-counter and prescription medicines only as told by your health care provider.  Do not drive or use heavy machinery while taking prescription pain medicine.  If you are taking prescription pain medicine, take actions to prevent or treat constipation. Your health care provider may recommend that you: ? Drink enough fluid to keep  your urine pale yellow. ? Eat foods that are high in fiber, such as fresh fruits and vegetables, whole grains, and beans. ? Limit foods that are high in fat and processed sugars, such as fried or sweet foods. ? Take an over-the-counter or prescription medicine for constipation. Managing pain, stiffness, and swelling  If directed, put ice on the injured area: ? Put ice in a plastic bag. ? Place a towel between your skin and the bag. ? Leave the ice on for 20 minutes, 2-3 times a day.  Rest the injured area. Avoid strenuous activity and any activities or movements that cause pain. Be careful during activities and avoid bumping the injured area.  Do not lift anything that is heavier than 5 lb (2.3 kg), or the limit that you are told, until your health care provider says that it is safe. General instructions  Do not use any products that contain nicotine or tobacco, such as cigarettes and e-cigarettes. These can delay healing. If you need help quitting, ask your health care provider.  Do deep-breathing exercises as told by your health care provider.  If you were given an incentive spirometer, use it every 1-2 hours while you are awake, or as recommended by your health care provider. This device measures how well you are filling your lungs with each breath.  Keep all follow-up visits as told by your health care provider. This is important. Contact a health care provider if you have:  Increased bruising or swelling.  Pain that is not controlled with treatment.  A fever. Get help right away if   you:  Have difficulty breathing or shortness of breath.  Develop a continual cough or you cough up thick or bloody sputum.  Feel nauseous or you vomit.  Have pain in your abdomen. Summary  A rib contusion is a deep bruise on your rib area. Contusions are the result of a blunt trauma that causes bleeding and injury to the tissues under the skin.  The skin overlying the contusion may turn  blue, purple, or yellow. Minor injuries may give you a painless contusion. More severe contusions may stay painful and swollen for a few weeks.  Rest the injured area. Avoid strenuous activity and any activities or movements that cause pain. This information is not intended to replace advice given to you by your health care provider. Make sure you discuss any questions you have with your health care provider. Document Revised: 04/17/2017 Document Reviewed: 04/17/2017 Elsevier Patient Education  [place] Prevention in the Home, Adult Falls can cause injuries and can affect people from all age groups. There are many simple things that you can do to make your home safe and to help prevent falls. Ask for help when making these changes, if needed. What actions can I take to prevent falls? General instructions  Use good lighting in all rooms. Replace any light bulbs that burn out.  Turn on lights if it is dark. Use night-lights.  Place frequently used items in easy-to-reach places. Lower the shelves around your home if necessary.  Set up furniture so that there are clear paths around it. Avoid moving your furniture around.  Remove throw rugs and other tripping hazards from the floor.  Avoid walking on wet floors.  Fix any uneven floor surfaces.  Add color or contrast paint or tape to grab bars and handrails in your home. Place contrasting color strips on the first and last steps of stairways.  When you use a stepladder, make sure that it is completely opened and that the sides are firmly locked. Have someone hold the ladder while you are using it. Do not climb a closed stepladder.  Be aware of any and all pets. What can I do in the bathroom?      Keep the floor dry. Immediately clean up any water that spills onto the floor.  Remove soap buildup in the tub or shower on a regular basis.  Use non-skid mats or decals on the floor of the tub or shower.  Attach bath mats  securely with double-sided, non-slip rug tape.  If you need to sit down while you are in the shower, use a plastic, non-slip stool.  Install grab bars by the toilet and in the tub and shower. Do not use towel bars as grab bars. What can I do in the bedroom?  Make sure that a bedside light is easy to reach.  Do not use oversized bedding that drapes onto the floor.  Have a firm chair that has side arms to use for getting dressed. What can I do in the kitchen?  Clean up any spills right away.  If you need to reach for something above you, use a sturdy step stool that has a grab bar.  Keep electrical cables out of the way.  Do not use floor polish or wax that makes floors slippery. If you must use wax, make sure that it is non-skid floor wax. What can I do in the stairways?  Do not leave any items on the stairs.  Make sure that you have  a light switch at the top of the stairs and the bottom of the stairs. Have them installed if you do not have them.  Make sure that there are handrails on both sides of the stairs. Fix handrails that are broken or loose. Make sure that handrails are as long as the stairways.  Install non-slip stair treads on all stairs in your home.  Avoid having throw rugs at the top or bottom of stairways, or secure the rugs with carpet tape to prevent them from moving.  Choose a carpet design that does not hide the edge of steps on the stairway.  Check any carpeting to make sure that it is firmly attached to the stairs. Fix any carpet that is loose or worn. What can I do on the outside of my home?  Use bright outdoor lighting.  Regularly repair the edges of walkways and driveways and fix any cracks.  Remove high doorway thresholds.  Trim any shrubbery on the main path into your home.  Regularly check that handrails are securely fastened and in good repair. Both sides of any steps should have handrails.  Install guardrails along the edges of any raised  decks or porches.  Clear walkways of debris and clutter, including tools and rocks.  Have leaves, snow, and ice cleared regularly.  Use sand or salt on walkways during winter months.  In the garage, clean up any spills right away, including grease or oil spills. What other actions can I take?  Wear closed-toe shoes that fit well and support your feet. Wear shoes that have rubber soles or low heels.  Use mobility aids as needed, such as canes, walkers, scooters, and crutches.  Review your medicines with your health care provider. Some medicines can cause dizziness or changes in blood pressure, which increase your risk of falling. Talk with your health care provider about other ways that you can decrease your risk of falls. This may include working with a physical therapist or trainer to improve your strength, balance, and endurance. Where to find more information  Centers for Disease Control and Prevention, STEADI: WebmailGuide.co.za  [employer] on Aging: BrainJudge.co.uk Contact a health care provider if:  You are afraid of falling at home.  You feel weak, drowsy, or dizzy at home.  You fall at home. Summary  There are many simple things that you can do to make your home safe and to help prevent falls.  Ways to make your home safe include removing tripping hazards and installing grab bars in the bathroom.  Ask for help when making these changes in your home. This information is not intended to replace advice given to you by your health care provider. Make sure you discuss any questions you have with your health care provider. Document Revised: 03/01/2017 Document Reviewed: 11/01/2016 Elsevier Patient Education  2020 [employer].

## 2020-02-22 NOTE — Progress Notes (Signed)
Established Patient Office Visit  Subjective:  Patient ID: Miguel Vazquez, male    DOB: Jul 23, 1940  Age: 79 y.o. MRN: [id number]  CC:  Chief Complaint  Patient presents with  . Follow-up    follwo up from urgent care patient had a fall last week, still having left side pain and lower back.    HPI Miguel Vazquez presents for follow-up from recent fall.  [place]  He got up from the bed and apparently stepped onto a pillow which was on the floor.  He lost balance and fell apparently onto his left side.  There was no reported head injury.  No loss of consciousness.  Able to ambulate afterwards.  Did not feel particularly bad afterwards and did not seek out any immediate care.  His fall occurred on Tuesday which was 02/16/2020.  They came home and few days later he was noticing increasing pain in his left posterior thoracic area.  They went to urgent care and had chest x-ray done which showed question of very small left pleural effusion versus contusion.  No acute pneumonia.  There was no comment of any rib fractures or other obvious effects of trauma.  No pneumothorax.  He was prescribed tramadol which he has been taking sporadically which does help with severe pain.  He has been able to take deep breaths.  No cough.  No fever.  Denies any extremity injuries.  He has had physical therapy previously because of prior falls.  Has not noted any gross hematuria.  Past Medical History:  Diagnosis Date  . Acute blood loss anemia   . Arthritis   . Brain aneurysm 2018   followed by Dr [name]  . Cataract   . Chronic kidney disease    kidney stone- 40 years ago  . Coronary artery disease   . 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 Diabetes mellitus   . Diverticulitis   . GERD (gastroesophageal reflux disease)   . Hx of abdominal aortic aneurysm 1998  . Hyperlipidemia   . Hypertension   . Lower GI bleed   . Myocardial infarction ([place])    2016  . Personal history of  colonic polyps - adenomas 09/09/2013    Past Surgical History:  Procedure Laterality Date  . ABDOMINAL AORTIC ANEURYSM REPAIR  1998  . CARDIAC CATHETERIZATION N/A 03/18/2015   Procedure: Left Heart Cath and Coronary Angiography;  Surgeon: Peter M Martinique, MD;  Location: [facility];  Service: Cardiovascular;  Laterality: N/A;  . cataract surgery Right   . COLONOSCOPY    . CORONARY ARTERY BYPASS GRAFT N/A 03/18/2015   Procedure: CORONARY ARTERY BYPASS GRAFTING (CABG) x  three, using left internal mammary artery and right leg greater saphenous vein harvested endoscopically;  Surgeon: Melrose Nakayama, MD;  Location: [facility];  Service: Open Heart Surgery;  Laterality: N/A;  . INGUINAL HERNIA REPAIR Bilateral   . INTRAOPERATIVE TRANSESOPHAGEAL ECHOCARDIOGRAM N/A 03/18/2015   Procedure: INTRAOPERATIVE TRANSESOPHAGEAL ECHOCARDIOGRAM;  Surgeon: Melrose Nakayama, MD;  Location: [facility];  Service: Open Heart Surgery;  Laterality: N/A;  . open heart surgery  2016  . UMBILICAL HERNIA REPAIR  1965    Family History  Problem Relation Age of Onset  . Bone cancer Mother   . Diabetes Paternal Aunt   . Stroke Son   . Down syndrome Son   . Colon cancer Neg Hx   . Esophageal cancer Neg Hx   .  Rectal cancer Neg Hx   . Stomach cancer Neg Hx     Social History   Socioeconomic History  . Marital status: Married    Spouse name: [name]  . Number of children: 3  . Years of education: Not on file  . Highest education level: Not on file  Occupational History  . Not on file  Tobacco Use  . Smoking status: Former Smoker    Packs/day: 1.50    Years: 20.00    Pack years: 30.00    Types: Cigarettes    Quit date: 08/24/1986    Years since quitting: 33.5  . Smokeless tobacco: Never Used  Vaping Use  . Vaping Use: Never used  Substance and Sexual Activity  . Alcohol use: Not Currently    Comment: occ  . Drug use: No  . Sexual activity: Not on file  Other Topics Concern  . Not on file   Social History Narrative  . Not on file   Social Determinants of Health   Financial Resource Strain: Low Risk   . Difficulty of Paying Living Expenses: Not hard at all  Food Insecurity: No Food Insecurity  . Worried About Charity fundraiser in the Last Year: Never true  . Ran Out of Food in the Last Year: Never true  Transportation Needs: No Transportation Needs  . Lack of Transportation (Medical): No  . Lack of Transportation (Non-Medical): No  Physical Activity: Inactive  . Days of Exercise per Week: 0 days  . Minutes of Exercise per Session: 0 min  Stress: No Stress Concern Present  . Feeling of Stress : Not at all  Social Connections: Socially Integrated  . Frequency of Communication with Friends and Family: More than three times a week  . Frequency of Social Gatherings with Friends and Family: More than three times a week  . Attends Religious Services: More than 4 times per year  . Active Member of Clubs or Organizations: Yes  . Attends Archivist Meetings: More than 4 times per year  . Marital Status: Married  Human resources officer Violence: Not At Risk  . Fear of Current or Ex-Partner: No  . Emotionally Abused: No  . Physically Abused: No  . Sexually Abused: No    Outpatient Medications Prior to Visit  Medication Sig Dispense Refill  . aspirin EC 81 MG tablet Take 1 tablet (81 mg total) by mouth daily. 90 tablet 3  . atorvastatin (LIPITOR) 80 MG tablet TAKE 1 TABLET (80 MG TOTAL) BY MOUTH DAILY AT 6 PM. 90 tablet 2  . carvedilol (COREG) 6.25 MG tablet TAKE 1 TABLET BY MOUTH TWICE A DAY 180 tablet 3  . donepezil (ARICEPT) 10 MG tablet Take 1 tablet (10 mg total) by mouth at bedtime. 90 tablet 3  . EPINEPHrine 0.3 mg/0.3 mL IJ SOAJ injection Inject 0.3 mLs (0.3 mg total) into the muscle as needed for anaphylaxis. 2 each 3  . escitalopram (LEXAPRO) 10 MG tablet TAKE 1 TABLET BY MOUTH EVERY DAY 90 tablet 2  . ferrous sulfate 324 MG TBEC Take 324 mg by mouth.    Marland Kitchen  glucose blood (FREESTYLE LITE) test strip Check Blood Sugars 1-2 times per day. DX: E11.9. 100 each 5  . Lancets (FREESTYLE) lancets Check Blood Sugars 1-2 times per day. DX: E11.9 100 each 5  . levothyroxine (SYNTHROID) 75 MCG tablet TAKE 1 TABLET BY MOUTH EVERY DAY 90 tablet 1  . losartan (COZAAR) 100 MG tablet TAKE 1 TABLET BY MOUTH  EVERY DAY 90 tablet 1  . metFORMIN (GLUCOPHAGE) 1000 MG tablet TAKE 1 TABLET BY MOUTH TWICE A DAY 180 tablet 3  . Multiple Vitamin (MULTIVITAMIN) tablet Take 1 tablet by mouth daily. Centrium Silver once daily     . traMADol (ULTRAM) 50 MG tablet SMARTSIG:1 Tablet(s) By Mouth 4-5 Times Daily    . vitamin B-12 (CYANOCOBALAMIN) 1000 MCG tablet Take 1,000 mcg by mouth daily.    . vitamin C (ASCORBIC ACID) 500 MG tablet Take 500 mg by mouth every evening.     . terbinafine (LAMISIL) 250 MG tablet Take 1 tablet (250 mg total) by mouth daily. (Patient not taking: Reported on 02/22/2020) 90 tablet 0   No facility-administered medications prior to visit.    Allergies  Allergen Reactions  . Food Anaphylaxis    TREE NUTS  . Shellfish Allergy Anaphylaxis  . Prednisone     "psychosis"  . Venomil Honey Bee Venom [Honey Bee Venom] Swelling    Localized swelling    ROS Review of Systems  Constitutional: Negative for chills and fever.  Respiratory: Negative for cough and shortness of breath.   Cardiovascular: Negative for chest pain.  Gastrointestinal: Negative for abdominal pain.  Genitourinary: Negative for dysuria and hematuria.  Musculoskeletal: Positive for back pain.      Objective:    Physical Exam Vitals reviewed.  Constitutional:      Appearance: Normal appearance.  HENT:     Head: Normocephalic and atraumatic.  Cardiovascular:     Rate and Rhythm: Normal rate and regular rhythm.  Pulmonary:     Effort: Pulmonary effort is normal.     Breath sounds: Normal breath sounds.  Musculoskeletal:     Right lower leg: No edema.     Left lower leg: No  edema.     Comments: He has area of bruising about 3 x 6 cm left mid thoracic area.  He is tender to palpation in this region over the left posterior ribs around the eighth to ninth rib.  Neurological:     General: No focal deficit present.     Mental Status: He is alert.     Cranial Nerves: No cranial nerve deficit.     BP 136/72   Pulse 76   Temp 97.8 F (36.6 C) (Oral)   Ht 5\' 8"  (1.727 m)   Wt 149 lb 8 oz (67.8 kg)   SpO2 96%   BMI 22.73 kg/m  Wt Readings from Last 3 Encounters:  02/22/20 149 lb 8 oz (67.8 kg)  12/16/19 153 lb (69.4 kg)  09/15/19 149 lb 9.6 oz (67.9 kg)     Health Maintenance Due  Topic Date Due  . Hepatitis C Screening  Never done  . OPHTHALMOLOGY EXAM  07/13/2015  . FOOT EXAM  09/12/2018    There are no preventive care reminders to display for this patient.  Lab Results  Component Value Date   TSH 1.88 12/16/2019   Lab Results  Component Value Date   WBC 7.7 09/23/2019   HGB 13.0 09/23/2019   HCT 38.7 (L) 09/23/2019   MCV 93.1 09/23/2019   PLT 138.0 (L) 09/23/2019   Lab Results  Component Value Date   NA 141 12/16/2019   K 4.8 12/16/2019   CO2 29 12/16/2019   GLUCOSE 85 12/16/2019   BUN 22 12/16/2019   CREATININE 1.16 12/16/2019   BILITOT 0.8 12/16/2019   ALKPHOS 67 06/24/2019   AST 19 12/16/2019   ALT 21 12/16/2019  PROT 6.6 12/16/2019   ALBUMIN 3.6 06/24/2019   CALCIUM 10.1 12/16/2019   ANIONGAP 10 06/26/2019   GFR 66.26 04/15/2019   Lab Results  Component Value Date   CHOL 125 12/16/2019   Lab Results  Component Value Date   HDL 45 12/16/2019   Lab Results  Component Value Date   LDLCALC 60 12/16/2019   Lab Results  Component Value Date   TRIG 117 12/16/2019   Lab Results  Component Value Date   CHOLHDL 2.8 12/16/2019   Lab Results  Component Value Date   HGBA1C 6.3 (H) 12/16/2019      Assessment & Plan:   #1 recent fall with left thoracic back pain.  We explained he may have rib fracture or  fractures and that these are sometimes difficult to see on x-ray but repeat x-ray would not change her management  -We emphasized importance of taking deep breaths several times daily -Continue tramadol as needed for pain relief  #2 history of recurrent falls.  -Discussed fall prevention with handout given.  He has had previous physical therapy for fall prevention  #3 recent abnormal chest x-ray at urgent care.  May have had some atelectasis or possibly small pleural effusion related to the fall.  No pneumothorax.  Lung exam unremarkable and patient in no respiratory distress.  He has had no signs of infection such as cough or fever  -Recommend 2-week follow-up and consider repeat chest x-ray at that time  No orders of the defined types were placed in this encounter.   Follow-up: Return in about 2 weeks (around 03/07/2020).    Carolann Littler, MD

## 2020-02-23 ENCOUNTER — Ambulatory Visit: Payer: PPO | Admitting: Family Medicine

## 2020-03-07 ENCOUNTER — Other Ambulatory Visit: Payer: Self-pay

## 2020-03-07 ENCOUNTER — Ambulatory Visit (INDEPENDENT_AMBULATORY_CARE_PROVIDER_SITE_OTHER): Payer: PPO

## 2020-03-07 ENCOUNTER — Encounter: Payer: Self-pay | Admitting: Family Medicine

## 2020-03-07 ENCOUNTER — Ambulatory Visit (INDEPENDENT_AMBULATORY_CARE_PROVIDER_SITE_OTHER): Payer: PPO | Admitting: Family Medicine

## 2020-03-07 VITALS — BP 128/66 | HR 73 | Temp 97.9°F | Resp 12 | Ht 68.0 in | Wt 150.6 lb

## 2020-03-07 DIAGNOSIS — E1121 Type 2 diabetes mellitus with diabetic nephropathy: Secondary | ICD-10-CM | POA: Diagnosis not present

## 2020-03-07 DIAGNOSIS — Z9181 History of falling: Secondary | ICD-10-CM

## 2020-03-07 DIAGNOSIS — R9389 Abnormal findings on diagnostic imaging of other specified body structures: Secondary | ICD-10-CM

## 2020-03-07 DIAGNOSIS — I10 Essential (primary) hypertension: Secondary | ICD-10-CM | POA: Diagnosis not present

## 2020-03-07 DIAGNOSIS — S299XXA Unspecified injury of thorax, initial encounter: Secondary | ICD-10-CM | POA: Diagnosis not present

## 2020-03-07 IMAGING — DX DG CHEST 2V
2 series · 2 of 2 positions shown · non-contrast
Comparison: [DATE] chest radiograph and prior.

CLINICAL DATA: recent abnormal CXR- new left pleural effusion
following injury

EXAM:
CHEST - 2 VIEW

[chest pa]
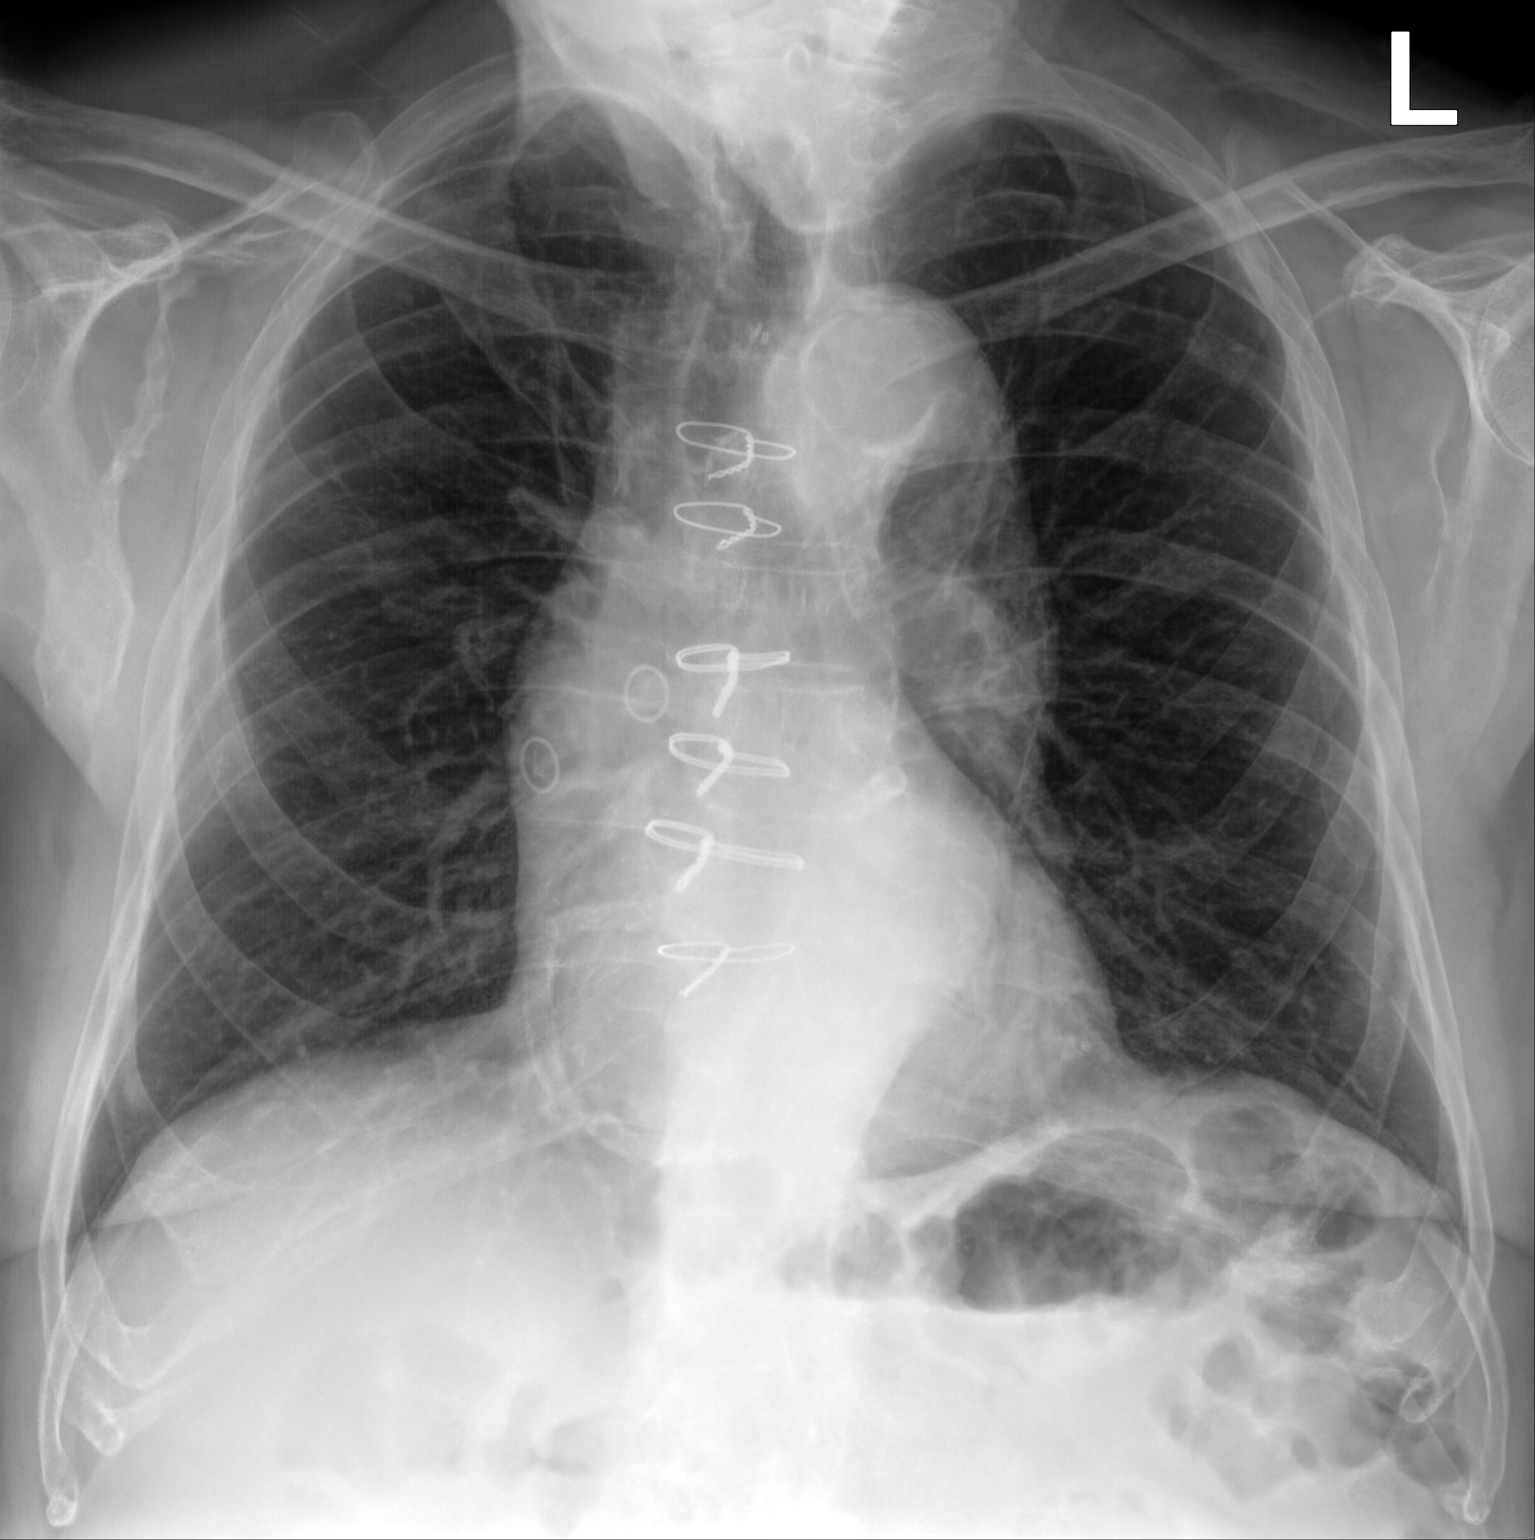

[chest lat]
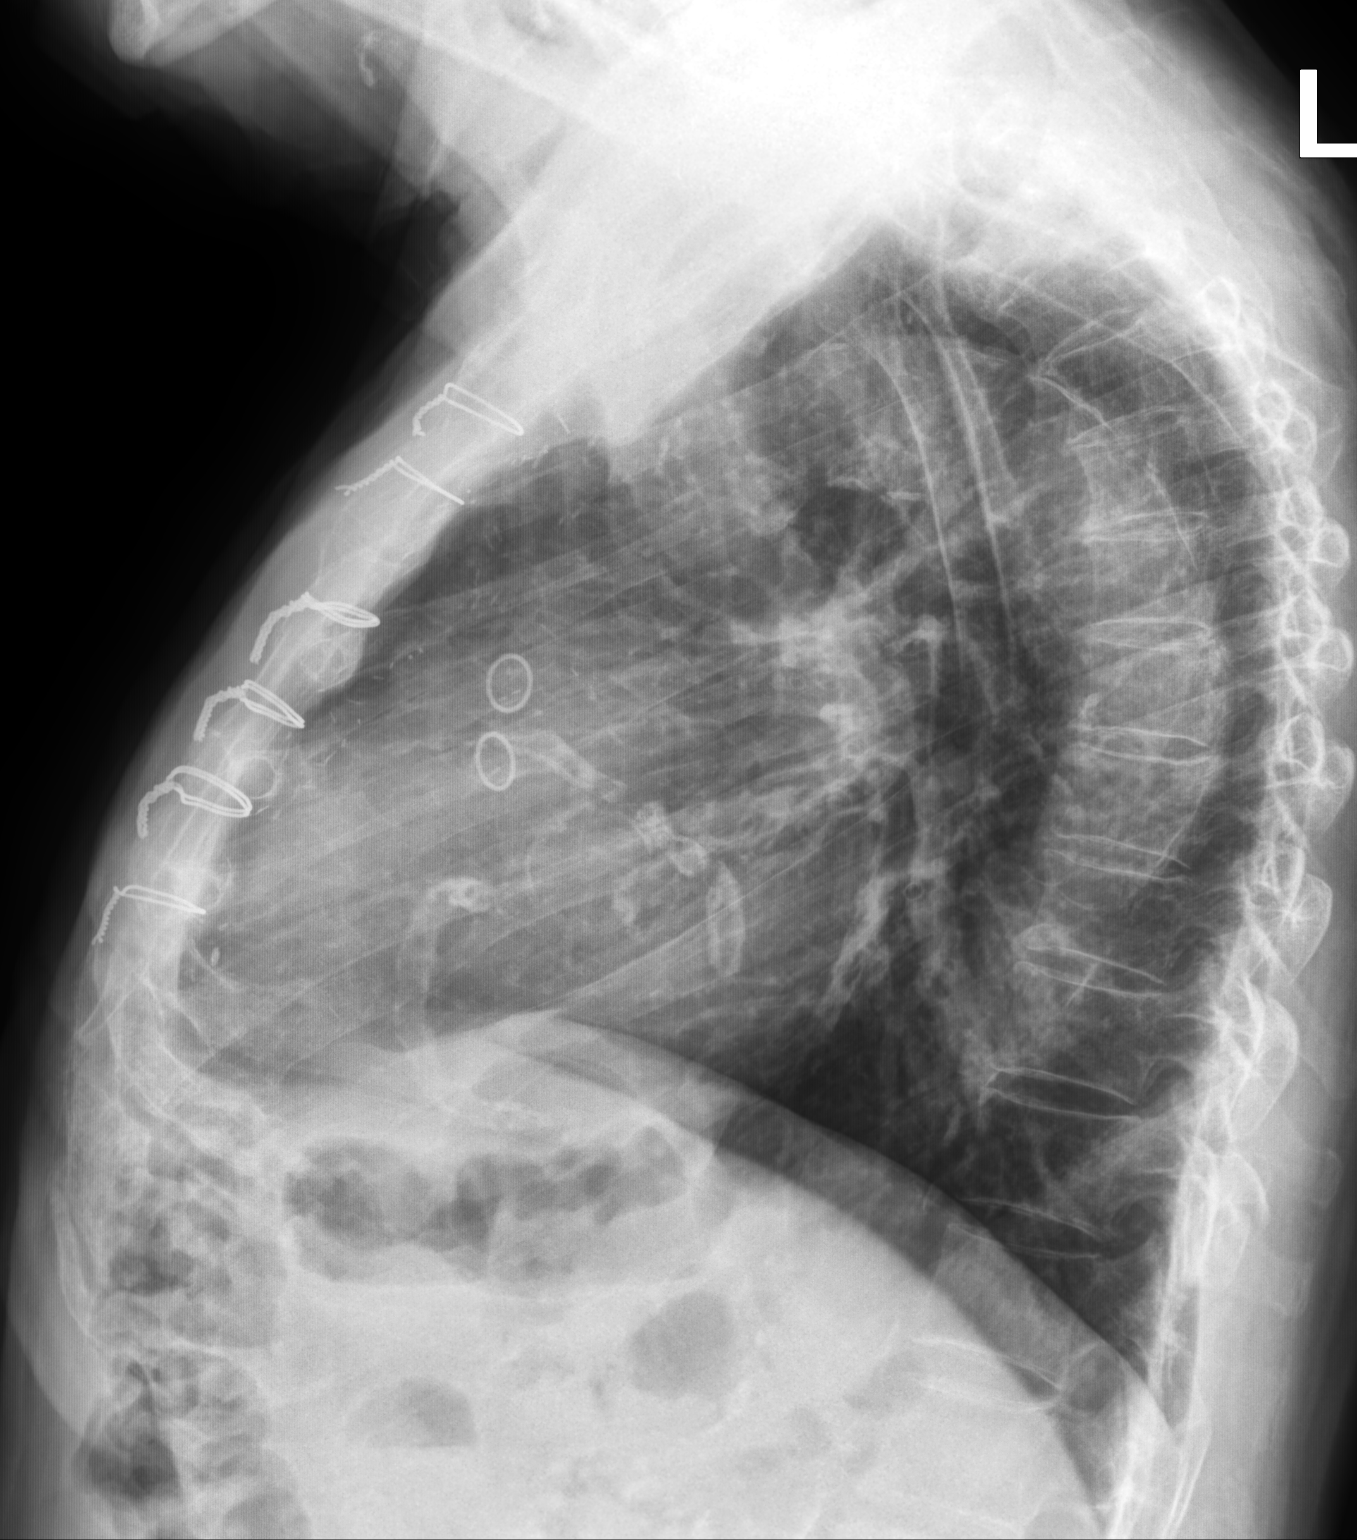

[2 of 2 positions shown; findings below may reference images not displayed]

FINDINGS: No focal consolidation. No pneumothorax or pleural effusion.
Postsurgical appearance of the cardiomediastinal silhouette.
Multilevel spondylosis. Chronic deformity of the right scapula.
IMPRESSION: No focal airspace disease.  No pleural effusion.

## 2020-03-07 MED ORDER — LOSARTAN POTASSIUM 50 MG PO TABS: 50.0000 mg | ORAL_TABLET | Freq: Every day | ORAL | 3 refills | Status: DC

## 2020-03-07 NOTE — Progress Notes (Signed)
Established Patient Office Visit  Subjective:  Patient ID: Miguel Vazquez, male    DOB: Aug 09, 1940  Age: 79 y.o. MRN: [id number]  CC:  Chief Complaint  Patient presents with  . follow up fall    still some lower back pain  . has question about losartan    HPI Miguel Vazquez presents for several issues as follows  Recent fall.  He and his wife were up at their cabin in the [place].  He did not have any syncope but more less loss of balance and tripped over pillow in the floor.  Went to urgent care.  No fractures.  Did have chest x-ray which showed small left pleural effusion versus atelectasis with recommended 2 to 3-week follow-up.  No recent cough.  He has had frequent dizziness but it sounds like some of this at least may be vestibular.  Occasional lightheadedness.  Had been on losartan 100 mg daily and currently they are taking half tablet daily.  His daughter who is a nurse had suggested they may consider whether to stop this.  She wanted that to be discussed today.  He generally tries to stay well-hydrated.  Recent CBC unremarkable.  Has type 2 diabetes.  No recent hypoglycemia.  Currently on Metformin 1000 mg twice daily.  Recent A1c 6.3%.  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  No abnormal enhancement and no aggressive features.  The plan to get 68-month follow-up  Moderate risk for falls.  He had physical therapy several months ago and they felt like that helped.  They would like to consider going through that again.  Currently ambulating without any assistance.  Past Medical History:  Diagnosis Date  . Acute blood loss anemia   . Arthritis   . Brain aneurysm 2018   followed by Dr [name]  . Cataract   . Chronic kidney disease    kidney stone- 40 years ago  . Coronary artery disease   . 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 Diabetes mellitus   . Diverticulitis   . GERD (gastroesophageal reflux disease)   . Hx of abdominal aortic aneurysm 1998   . Hyperlipidemia   . Hypertension   . Lower GI bleed   . Myocardial infarction ([place])    2016  . Personal history of colonic polyps - adenomas 09/09/2013    Past Surgical History:  Procedure Laterality Date  . ABDOMINAL AORTIC ANEURYSM REPAIR  1998  . CARDIAC CATHETERIZATION N/A 03/18/2015   Procedure: Left Heart Cath and Coronary Angiography;  Surgeon: Peter M Martinique, MD;  Location: [facility];  Service: Cardiovascular;  Laterality: N/A;  . cataract surgery Right   . COLONOSCOPY    . CORONARY ARTERY BYPASS GRAFT N/A 03/18/2015   Procedure: CORONARY ARTERY BYPASS GRAFTING (CABG) x  three, using left internal mammary artery and right leg greater saphenous vein harvested endoscopically;  Surgeon: Melrose Nakayama, MD;  Location: [facility];  Service: Open Heart Surgery;  Laterality: N/A;  . INGUINAL HERNIA REPAIR Bilateral   . INTRAOPERATIVE TRANSESOPHAGEAL ECHOCARDIOGRAM N/A 03/18/2015   Procedure: INTRAOPERATIVE TRANSESOPHAGEAL ECHOCARDIOGRAM;  Surgeon: Melrose Nakayama, MD;  Location: [facility];  Service: Open Heart Surgery;  Laterality: N/A;  . open heart surgery  2016  . UMBILICAL HERNIA REPAIR  1965    Family History  Problem Relation Age of Onset  . Bone cancer Mother   . Diabetes Paternal Aunt   . Stroke Son   .  Down syndrome Son   . Colon cancer Neg Hx   . Esophageal cancer Neg Hx   . Rectal cancer Neg Hx   . Stomach cancer Neg Hx     Social History   Socioeconomic History  . Marital status: Married    Spouse name: [name]  . Number of children: 3  . Years of education: Not on file  . Highest education level: Not on file  Occupational History  . Not on file  Tobacco Use  . Smoking status: Former Smoker    Packs/day: 1.50    Years: 20.00    Pack years: 30.00    Types: Cigarettes    Quit date: 08/24/1986    Years since quitting: 33.5  . Smokeless tobacco: Never Used  Vaping Use  . Vaping Use: Never used  Substance and Sexual Activity  . Alcohol use:  Not Currently    Comment: occ  . Drug use: No  . Sexual activity: Not on file  Other Topics Concern  . Not on file  Social History Narrative  . Not on file   Social Determinants of Health   Financial Resource Strain: Low Risk   . Difficulty of Paying Living Expenses: Not hard at all  Food Insecurity: No Food Insecurity  . Worried About Charity fundraiser in the Last Year: Never true  . Ran Out of Food in the Last Year: Never true  Transportation Needs: No Transportation Needs  . Lack of Transportation (Medical): No  . Lack of Transportation (Non-Medical): No  Physical Activity: Inactive  . Days of Exercise per Week: 0 days  . Minutes of Exercise per Session: 0 min  Stress: No Stress Concern Present  . Feeling of Stress : Not at all  Social Connections: Socially Integrated  . Frequency of Communication with Friends and Family: More than three times a week  . Frequency of Social Gatherings with Friends and Family: More than three times a week  . Attends Religious Services: More than 4 times per year  . Active Member of Clubs or Organizations: Yes  . Attends Archivist Meetings: More than 4 times per year  . Marital Status: Married  Human resources officer Violence: Not At Risk  . Fear of Current or Ex-Partner: No  . Emotionally Abused: No  . Physically Abused: No  . Sexually Abused: No    Outpatient Medications Prior to Visit  Medication Sig Dispense Refill  . aspirin EC 81 MG tablet Take 1 tablet (81 mg total) by mouth daily. 90 tablet 3  . atorvastatin (LIPITOR) 80 MG tablet TAKE 1 TABLET (80 MG TOTAL) BY MOUTH DAILY AT 6 PM. 90 tablet 2  . carvedilol (COREG) 6.25 MG tablet TAKE 1 TABLET BY MOUTH TWICE A DAY 180 tablet 3  . donepezil (ARICEPT) 10 MG tablet Take 1 tablet (10 mg total) by mouth at bedtime. 90 tablet 3  . EPINEPHrine 0.3 mg/0.3 mL IJ SOAJ injection Inject 0.3 mLs (0.3 mg total) into the muscle as needed for anaphylaxis. 2 each 3  . escitalopram (LEXAPRO)  10 MG tablet TAKE 1 TABLET BY MOUTH EVERY DAY 90 tablet 2  . ferrous sulfate 324 MG TBEC Take 324 mg by mouth.    Marland Kitchen glucose blood (FREESTYLE LITE) test strip Check Blood Sugars 1-2 times per day. DX: E11.9. 100 each 5  . Lancets (FREESTYLE) lancets Check Blood Sugars 1-2 times per day. DX: E11.9 100 each 5  . levothyroxine (SYNTHROID) 75 MCG tablet TAKE 1  TABLET BY MOUTH EVERY DAY 90 tablet 1  . metFORMIN (GLUCOPHAGE) 1000 MG tablet TAKE 1 TABLET BY MOUTH TWICE A DAY 180 tablet 3  . Multiple Vitamin (MULTIVITAMIN) tablet Take 1 tablet by mouth daily. Centrium Silver once daily     . terbinafine (LAMISIL) 250 MG tablet Take 1 tablet (250 mg total) by mouth daily. 90 tablet 0  . traMADol (ULTRAM) 50 MG tablet SMARTSIG:1 Tablet(s) By Mouth 4-5 Times Daily    . vitamin B-12 (CYANOCOBALAMIN) 1000 MCG tablet Take 1,000 mcg by mouth daily.    . vitamin C (ASCORBIC ACID) 500 MG tablet Take 500 mg by mouth every evening.     Marland Kitchen losartan (COZAAR) 100 MG tablet TAKE 1 TABLET BY MOUTH EVERY DAY 90 tablet 1   No facility-administered medications prior to visit.    Allergies  Allergen Reactions  . Food Anaphylaxis    TREE NUTS  . Shellfish Allergy Anaphylaxis  . Prednisone     "psychosis"  . Venomil Honey Bee Venom [Honey Bee Venom] Swelling    Localized swelling    ROS Review of Systems  Constitutional: Negative for chills and fever.  Respiratory: Negative for cough and shortness of breath.   Cardiovascular: Negative for chest pain.  Gastrointestinal: Negative for abdominal pain.  Musculoskeletal: Positive for back pain.  Neurological: Positive for dizziness and light-headedness. Negative for seizures, syncope, speech difficulty and weakness.  Hematological: Negative for adenopathy.      Objective:    Physical Exam Vitals reviewed.  Constitutional:      Appearance: Normal appearance.  Cardiovascular:     Rate and Rhythm: Normal rate and regular rhythm.  Pulmonary:     Effort:  Pulmonary effort is normal.     Breath sounds: Normal breath sounds. No wheezing or rales.  Musculoskeletal:     Right lower leg: No edema.     Left lower leg: No edema.  Neurological:     General: No focal deficit present.     Mental Status: He is alert.     BP 128/66 (BP Location: Left Arm, Cuff Size: Normal)   Pulse 73   Temp 97.9 F (36.6 C) (Oral)   Resp 12   Ht 5\' 8"  (1.727 m)   Wt 150 lb 9.6 oz (68.3 kg)   SpO2 97%   BMI 22.90 kg/m  Wt Readings from Last 3 Encounters:  03/07/20 150 lb 9.6 oz (68.3 kg)  02/22/20 149 lb 8 oz (67.8 kg)  12/16/19 153 lb (69.4 kg)     Health Maintenance Due  Topic Date Due  . Hepatitis C Screening  Never done  . OPHTHALMOLOGY EXAM  07/13/2015  . FOOT EXAM  09/12/2018    There are no preventive care reminders to display for this patient.  Lab Results  Component Value Date   TSH 1.88 12/16/2019   Lab Results  Component Value Date   WBC 7.7 09/23/2019   HGB 13.0 09/23/2019   HCT 38.7 (L) 09/23/2019   MCV 93.1 09/23/2019   PLT 138.0 (L) 09/23/2019   Lab Results  Component Value Date   NA 141 12/16/2019   K 4.8 12/16/2019   CO2 29 12/16/2019   GLUCOSE 85 12/16/2019   BUN 22 12/16/2019   CREATININE 1.16 12/16/2019   BILITOT 0.8 12/16/2019   ALKPHOS 67 06/24/2019   AST 19 12/16/2019   ALT 21 12/16/2019   PROT 6.6 12/16/2019   ALBUMIN 3.6 06/24/2019   CALCIUM 10.1 12/16/2019   ANIONGAP 10  06/26/2019   GFR 66.26 04/15/2019   Lab Results  Component Value Date   CHOL 125 12/16/2019   Lab Results  Component Value Date   HDL 45 12/16/2019   Lab Results  Component Value Date   LDLCALC 60 12/16/2019   Lab Results  Component Value Date   TRIG 117 12/16/2019   Lab Results  Component Value Date   CHOLHDL 2.8 12/16/2019   Lab Results  Component Value Date   HGBA1C 6.3 (H) 12/16/2019      Assessment & Plan:   #1 hypertension.  Currently well controlled.  Recheck blood pressure left arm seated 122/60 and  standing 115/60  -Continue losartan but reduce dose to 50 mg daily.  Continue to monitor blood pressure closely  #2 type 2 diabetes.  History of good control.  Recent A1c 6.3%  -Continue current dose of Metformin and recheck A1c at 76-month follow-up  #3 recent abnormal chest x-ray with question of left pleural effusion versus atelectasis.  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 losartan (COZAAR) 50 MG tablet    Sig: Take 1 tablet (50 mg total) by mouth daily.    Dispense:  90 tablet    Refill:  3    Follow-up: Return in about 3 months (around 06/05/2020).    Carolann Littler, MD

## 2020-03-07 NOTE — Patient Instructions (Signed)
Will set up repeat physical therapy  Monitor blood pressure and follow up if BP systolic falling < 623 .

## 2020-03-08 NOTE — Progress Notes (Signed)
Chest x-ray now normal.  No evidence for effusion.

## 2020-03-17 DIAGNOSIS — R2689 Other abnormalities of gait and mobility: Secondary | ICD-10-CM | POA: Diagnosis not present

## 2020-03-23 DIAGNOSIS — R2689 Other abnormalities of gait and mobility: Secondary | ICD-10-CM | POA: Diagnosis not present

## 2020-04-06 DIAGNOSIS — R2689 Other abnormalities of gait and mobility: Secondary | ICD-10-CM | POA: Diagnosis not present

## 2020-04-13 ENCOUNTER — Other Ambulatory Visit: Payer: Self-pay | Admitting: Family Medicine

## 2020-05-20 ENCOUNTER — Other Ambulatory Visit: Payer: Self-pay

## 2020-05-23 ENCOUNTER — Ambulatory Visit (INDEPENDENT_AMBULATORY_CARE_PROVIDER_SITE_OTHER): Payer: PPO | Admitting: Family Medicine

## 2020-05-23 ENCOUNTER — Encounter: Payer: Self-pay | Admitting: Family Medicine

## 2020-05-23 ENCOUNTER — Other Ambulatory Visit: Payer: Self-pay

## 2020-05-23 VITALS — BP 148/88 | HR 66 | Ht 68.0 in | Wt 152.0 lb

## 2020-05-23 DIAGNOSIS — E1121 Type 2 diabetes mellitus with diabetic nephropathy: Secondary | ICD-10-CM | POA: Diagnosis not present

## 2020-05-23 DIAGNOSIS — R42 Dizziness and giddiness: Secondary | ICD-10-CM

## 2020-05-23 LAB — CBC WITH DIFFERENTIAL/PLATELET
Basophils Absolute: 0.1 10*3/uL (ref 0.0–0.1)
Basophils Relative: 0.9 % (ref 0.0–3.0)
Eosinophils Absolute: 0.6 10*3/uL (ref 0.0–0.7)
Eosinophils Relative: 6.4 % — ABNORMAL HIGH (ref 0.0–5.0)
HCT: 42.4 % (ref 39.0–52.0)
Hemoglobin: 14.4 g/dL (ref 13.0–17.0)
Lymphocytes Relative: 14 % (ref 12.0–46.0)
Lymphs Abs: 1.2 10*3/uL (ref 0.7–4.0)
MCHC: 33.9 g/dL (ref 30.0–36.0)
MCV: 98.3 fl (ref 78.0–100.0)
Monocytes Absolute: 0.6 10*3/uL (ref 0.1–1.0)
Monocytes Relative: 6.5 % (ref 3.0–12.0)
Neutro Abs: 6.4 10*3/uL (ref 1.4–7.7)
Neutrophils Relative %: 72.2 % (ref 43.0–77.0)
Platelets: 171 10*3/uL (ref 150.0–400.0)
RBC: 4.31 Mil/uL (ref 4.22–5.81)
RDW: 14.8 % (ref 11.5–15.5)
WBC: 8.9 10*3/uL (ref 4.0–10.5)

## 2020-05-23 LAB — BASIC METABOLIC PANEL
BUN: 22 mg/dL (ref 6–23)
CO2: 32 mEq/L (ref 19–32)
Calcium: 10.5 mg/dL (ref 8.4–10.5)
Chloride: 101 mEq/L (ref 96–112)
Creatinine, Ser: 1.03 mg/dL (ref 0.40–1.50)
GFR: 69.66 mL/min (ref >60.00)
Glucose, Bld: 131 mg/dL — ABNORMAL HIGH (ref 70–99)
Potassium: 5.3 mEq/L — ABNORMAL HIGH (ref 3.5–5.1)
Sodium: 141 mEq/L (ref 135–145)

## 2020-05-23 LAB — HEMOGLOBIN A1C: Hgb A1c MFr Bld: 6 % (ref 4.6–6.5)

## 2020-05-23 NOTE — Patient Instructions (Signed)
Dizziness Dizziness is a common problem. It is a feeling of unsteadiness or light-headedness. You may feel like you are about to faint. Dizziness can lead to injury if you stumble or fall. Anyone can become dizzy, but dizziness is more common in older adults. This condition can be caused by a number of things, including medicines, dehydration, or illness. Follow these instructions at home: Eating and drinking  Drink enough fluid to keep your urine clear or pale yellow. This helps to keep you from becoming dehydrated. Try to drink more clear fluids, such as water.  Do not drink alcohol.  Limit your caffeine intake if told to do so by your health care provider. Check ingredients and nutrition facts to see if a food or beverage contains caffeine.  Limit your salt (sodium) intake if told to do so by your health care provider. Check ingredients and nutrition facts to see if a food or beverage contains sodium. Activity  Avoid making quick movements. ? Rise slowly from chairs and steady yourself until you feel okay. ? In the morning, first sit up on the side of the bed. When you feel okay, stand slowly while you hold onto something until you know that your balance is fine.  If you need to stand in one place for a long time, move your legs often. Tighten and relax the muscles in your legs while you are standing.  Do not drive or use heavy machinery if you feel dizzy.  Avoid bending down if you feel dizzy. Place items in your home so that they are easy for you to reach without leaning over. Lifestyle  Do not use any products that contain nicotine or tobacco, such as cigarettes and e-cigarettes. If you need help quitting, ask your health care provider.  Try to reduce your stress level by using methods such as yoga or meditation. Talk with your health care provider if you need help to manage your stress. General instructions  Watch your dizziness for any changes.  Take over-the-counter and  prescription medicines only as told by your health care provider. Talk with your health care provider if you think that your dizziness is caused by a medicine that you are taking.  Tell a friend or a family member that you are feeling dizzy. If he or she notices any changes in your behavior, have this person call your health care provider.  Keep all follow-up visits as told by your health care provider. This is important. Contact a health care provider if:  Your dizziness does not go away.  Your dizziness or light-headedness gets worse.  You feel nauseous.  You have reduced hearing.  You have new symptoms.  You are unsteady on your feet or you feel like the room is spinning. Get help right away if:  You vomit or have diarrhea and are unable to eat or drink anything.  You have problems talking, walking, swallowing, or using your arms, hands, or legs.  You feel generally weak.  You are not thinking clearly or you have trouble forming sentences. It may take a friend or family member to notice this.  You have chest pain, abdominal pain, shortness of breath, or sweating.  Your vision changes.  You have any bleeding.  You have a severe headache.  You have neck pain or a stiff neck.  You have a fever. These symptoms may represent a serious problem that is an emergency. Do not wait to see if the symptoms will go away. Get medical help   right away. Call your local emergency services (911 in the [place] Do not drive yourself to the hospital. Summary  Dizziness is a feeling of unsteadiness or light-headedness. This condition can be caused by a number of things, including medicines, dehydration, or illness.  Anyone can become dizzy, but dizziness is more common in older adults.  Drink enough fluid to keep your urine clear or pale yellow. Do not drink alcohol.  Avoid making quick movements if you feel dizzy. Monitor your dizziness for any changes. This information is not intended to  replace advice given to you by your health care provider. Make sure you discuss any questions you have with your health care provider. Document Revised: 03/22/2017 Document Reviewed: 04/21/2016 Elsevier Patient Education  2021 [employer]  

## 2020-05-23 NOTE — Progress Notes (Signed)
Established Patient Office Visit  Subjective:  Patient ID: Miguel Vazquez, male    DOB: 11-29-1940  Age: 80 y.o. MRN: [id number]  CC:  Chief Complaint  Patient presents with  . Fatigue    HPI Miguel Vazquez presents for complaints of dizziness.  He is accompanied by his daughter.  He has multiple chronic problems including history of CAD, past history of atrial fibrillation, hypertension, type 2 diabetes, hypothyroidism, history of cognitive impairment.  History of abdominal aortic aneurysm repair.  History of recurrent depression.  His main complaint is decreased energy and fatigue and mostly dizziness.  He states his dizziness is worse when he first stands up but not describing this is orthostatic in the sense that this is not related to lightheadedness or near syncope.  He feels more unstable when he stands up and describing more vestibular type symptoms.  He has some chronic hearing changes but no sudden hearing changes.  No unilateral or new tinnitus.  No focal weakness.  No ataxia.  After several seconds his symptoms improved.  He had some recent physical therapy but apparently did not see much benefit with his dizziness.  Patient was concerned this was somehow related to his Metformin.  His A1c has been well controlled.  He does not take any other diabetes medications.  He has had the symptoms of dizziness now for several months and again did not seem to respond with vestibular rehab.  Past Medical History:  Diagnosis Date  . Acute blood loss anemia   . Arthritis   . Brain aneurysm 2018   followed by Dr [name]  . Cataract   . Chronic kidney disease    kidney stone- 40 years ago  . Coronary artery disease   . 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 Diabetes mellitus   . Diverticulitis   . GERD (gastroesophageal reflux disease)   . Hx of abdominal aortic aneurysm 1998  . Hyperlipidemia   . Hypertension   . Lower GI bleed   . Myocardial infarction ([place])    2016  . Personal history of  colonic polyps - adenomas 09/09/2013    Past Surgical History:  Procedure Laterality Date  . ABDOMINAL AORTIC ANEURYSM REPAIR  1998  . CARDIAC CATHETERIZATION N/A 03/18/2015   Procedure: Left Heart Cath and Coronary Angiography;  Surgeon: Peter M Martinique, MD;  Location: [facility];  Service: Cardiovascular;  Laterality: N/A;  . cataract surgery Right   . COLONOSCOPY    . CORONARY ARTERY BYPASS GRAFT N/A 03/18/2015   Procedure: CORONARY ARTERY BYPASS GRAFTING (CABG) x  three, using left internal mammary artery and right leg greater saphenous vein harvested endoscopically;  Surgeon: Melrose Nakayama, MD;  Location: [facility];  Service: Open Heart Surgery;  Laterality: N/A;  . INGUINAL HERNIA REPAIR Bilateral   . INTRAOPERATIVE TRANSESOPHAGEAL ECHOCARDIOGRAM N/A 03/18/2015   Procedure: INTRAOPERATIVE TRANSESOPHAGEAL ECHOCARDIOGRAM;  Surgeon: Melrose Nakayama, MD;  Location: [facility];  Service: Open Heart Surgery;  Laterality: N/A;  . open heart surgery  2016  . UMBILICAL HERNIA REPAIR  1965    Family History  Problem Relation Age of Onset  . Bone cancer Mother   . Diabetes Paternal Aunt   . Stroke Son   . Down syndrome Son   . Colon cancer Neg Hx   . Esophageal cancer Neg Hx   . Rectal cancer Neg Hx   . Stomach cancer Neg Hx     Social History   Socioeconomic History  .  Marital status: Married    Spouse name: [name]  . Number of children: 3  . Years of education: Not on file  . Highest education level: Not on file  Occupational History  . Not on file  Tobacco Use  . Smoking status: Former Smoker    Packs/day: 1.50    Years: 20.00    Pack years: 30.00    Types: Cigarettes    Quit date: 08/24/1986    Years since quitting: 33.7  . Smokeless tobacco: Never Used  Vaping Use  . Vaping Use: Never used  Substance and Sexual Activity  . Alcohol use: Not Currently    Comment: occ  . Drug use: No  . Sexual activity: Not on file  Other Topics Concern  . Not on file   Social History Narrative  . Not on file   Social Determinants of Health   Financial Resource Strain: Low Risk   . Difficulty of Paying Living Expenses: Not hard at all  Food Insecurity: No Food Insecurity  . Worried About Charity fundraiser in the Last Year: Never true  . Ran Out of Food in the Last Year: Never true  Transportation Needs: No Transportation Needs  . Lack of Transportation (Medical): No  . Lack of Transportation (Non-Medical): No  Physical Activity: Inactive  . Days of Exercise per Week: 0 days  . Minutes of Exercise per Session: 0 min  Stress: No Stress Concern Present  . Feeling of Stress : Not at all  Social Connections: Socially Integrated  . Frequency of Communication with Friends and Family: More than three times a week  . Frequency of Social Gatherings with Friends and Family: More than three times a week  . Attends Religious Services: More than 4 times per year  . Active Member of Clubs or Organizations: Yes  . Attends Archivist Meetings: More than 4 times per year  . Marital Status: Married  Human resources officer Violence: Not At Risk  . Fear of Current or Ex-Partner: No  . Emotionally Abused: No  . Physically Abused: No  . Sexually Abused: No    Outpatient Medications Prior to Visit  Medication Sig Dispense Refill  . aspirin EC 81 MG tablet Take 1 tablet (81 mg total) by mouth daily. 90 tablet 3  . atorvastatin (LIPITOR) 80 MG tablet TAKE 1 TABLET (80 MG TOTAL) BY MOUTH DAILY AT 6 PM. 90 tablet 2  . carvedilol (COREG) 6.25 MG tablet TAKE 1 TABLET BY MOUTH TWICE A DAY 180 tablet 3  . donepezil (ARICEPT) 10 MG tablet Take 1 tablet (10 mg total) by mouth at bedtime. 90 tablet 3  . EPINEPHrine 0.3 mg/0.3 mL IJ SOAJ injection Inject 0.3 mLs (0.3 mg total) into the muscle as needed for anaphylaxis. 2 each 3  . escitalopram (LEXAPRO) 10 MG tablet TAKE 1 TABLET BY MOUTH EVERY DAY 90 tablet 2  . ferrous sulfate 324 MG TBEC Take 324 mg by mouth.    Marland Kitchen  glucose blood (FREESTYLE LITE) test strip Check Blood Sugars 1-2 times per day. DX: E11.9. 100 each 5  . Lancets (FREESTYLE) lancets Check Blood Sugars 1-2 times per day. DX: E11.9 100 each 5  . levothyroxine (SYNTHROID) 75 MCG tablet TAKE 1 TABLET BY MOUTH EVERY DAY 90 tablet 1  . losartan (COZAAR) 50 MG tablet Take 1 tablet (50 mg total) by mouth daily. 90 tablet 3  . metFORMIN (GLUCOPHAGE) 1000 MG tablet TAKE 1 TABLET BY MOUTH TWICE A DAY 180  tablet 3  . Multiple Vitamin (MULTIVITAMIN) tablet Take 1 tablet by mouth daily. Centrium Silver once daily    . vitamin B-12 (CYANOCOBALAMIN) 1000 MCG tablet Take 1,000 mcg by mouth daily.    . vitamin C (ASCORBIC ACID) 500 MG tablet Take 500 mg by mouth every evening.     . terbinafine (LAMISIL) 250 MG tablet Take 1 tablet (250 mg total) by mouth daily. 90 tablet 0  . traMADol (ULTRAM) 50 MG tablet SMARTSIG:1 Tablet(s) By Mouth 4-5 Times Daily     No facility-administered medications prior to visit.    Allergies  Allergen Reactions  . Food Anaphylaxis    TREE NUTS  . Shellfish Allergy Anaphylaxis  . Prednisone     "psychosis"  . Tramadol Other (See Comments)    hallucinations  . Venomil Honey Bee Venom [Honey Bee Venom] Swelling    Localized swelling    ROS Review of Systems  Constitutional: Negative for chills and fever.  Respiratory: Negative for cough and shortness of breath.   Cardiovascular: Negative for chest pain.  Gastrointestinal: Negative for abdominal pain.  Neurological: Positive for dizziness. Negative for seizures, syncope, speech difficulty, weakness, light-headedness and headaches.  Psychiatric/Behavioral: Negative for confusion.      Objective:    Physical Exam Vitals reviewed.  Constitutional:      Appearance: Normal appearance.  Cardiovascular:     Rate and Rhythm: Normal rate and regular rhythm.  Pulmonary:     Effort: Pulmonary effort is normal.     Breath sounds: Normal breath sounds.  Neurological:      General: No focal deficit present.     Mental Status: He is alert.     Cranial Nerves: No cranial nerve deficit.     Motor: No weakness.     Comments: Finger-to-nose testing is normal.  Romberg normal.  No focal strength deficits     BP (!) 148/88   Pulse 66   Ht 5\' 8"  (1.727 m)   Wt 152 lb (68.9 kg)   SpO2 94%   BMI 23.11 kg/m  Wt Readings from Last 3 Encounters:  05/23/20 152 lb (68.9 kg)  03/07/20 150 lb 9.6 oz (68.3 kg)  02/22/20 149 lb 8 oz (67.8 kg)     Health Maintenance Due  Topic Date Due  . Hepatitis C Screening  Never done  . OPHTHALMOLOGY EXAM  07/13/2015  . FOOT EXAM  09/12/2018    There are no preventive care reminders to display for this patient.  Lab Results  Component Value Date   TSH 1.88 12/16/2019   Lab Results  Component Value Date   WBC 7.7 09/23/2019   HGB 13.0 09/23/2019   HCT 38.7 (L) 09/23/2019   MCV 93.1 09/23/2019   PLT 138.0 (L) 09/23/2019   Lab Results  Component Value Date   NA 141 12/16/2019   K 4.8 12/16/2019   CO2 29 12/16/2019   GLUCOSE 85 12/16/2019   BUN 22 12/16/2019   CREATININE 1.16 12/16/2019   BILITOT 0.8 12/16/2019   ALKPHOS 67 06/24/2019   AST 19 12/16/2019   ALT 21 12/16/2019   PROT 6.6 12/16/2019   ALBUMIN 3.6 06/24/2019   CALCIUM 10.1 12/16/2019   ANIONGAP 10 06/26/2019   GFR 66.26 04/15/2019   Lab Results  Component Value Date   CHOL 125 12/16/2019   Lab Results  Component Value Date   HDL 45 12/16/2019   Lab Results  Component Value Date   LDLCALC 60 12/16/2019   Lab  Results  Component Value Date   TRIG 117 12/16/2019   Lab Results  Component Value Date   CHOLHDL 2.8 12/16/2019   Lab Results  Component Value Date   HGBA1C 6.3 (H) 12/16/2019      Assessment & Plan:   #1 type 2 diabetes.  History of good control.  Recheck A1c. -If remains well controlled consider scaling back Metformin to once daily though we reiterated that we do not think this is related to his dizziness in any  way  #2 intermittent dizziness.  This seems positional but doubt orthostatic.  Blood pressure today supine 160/78 and seated 152/80 with similar reading standing.  His dizziness sounds almost more vestibular.  We could not reproduce this with head position change and lying supine.  -Check CBC and basic metabolic panel -Recommend consider neurology consult given the duration of this for several months  #3 hypertension.  This has generally been well controlled but slightly up today.  We recommend monitor closely at home.  His daughter is a Marine scientist and they can keep check on this.  His home readings have actually been fairly stable  No orders of the defined types were placed in this encounter.   Follow-up: No follow-ups on file.    Carolann Littler, MD

## 2020-05-24 ENCOUNTER — Telehealth: Payer: Self-pay | Admitting: Family Medicine

## 2020-05-24 NOTE — Telephone Encounter (Signed)
error 

## 2020-05-27 ENCOUNTER — Emergency Department (HOSPITAL_BASED_OUTPATIENT_CLINIC_OR_DEPARTMENT_OTHER): Payer: PPO

## 2020-05-27 ENCOUNTER — Other Ambulatory Visit: Payer: Self-pay

## 2020-05-27 ENCOUNTER — Encounter (HOSPITAL_BASED_OUTPATIENT_CLINIC_OR_DEPARTMENT_OTHER): Payer: Self-pay | Admitting: *Deleted

## 2020-05-27 ENCOUNTER — Emergency Department (HOSPITAL_BASED_OUTPATIENT_CLINIC_OR_DEPARTMENT_OTHER)
Admission: EM | Admit: 2020-05-27 | Discharge: 2020-05-27 | Disposition: A | Discharge status: Home / Self Care | Payer: PPO | Attending: Emergency Medicine | Admitting: Emergency Medicine

## 2020-05-27 DIAGNOSIS — F0390 Unspecified dementia without behavioral disturbance: Secondary | ICD-10-CM | POA: Diagnosis not present

## 2020-05-27 DIAGNOSIS — Z7984 Long term (current) use of oral hypoglycemic drugs: Secondary | ICD-10-CM | POA: Insufficient documentation

## 2020-05-27 DIAGNOSIS — Z7982 Long term (current) use of aspirin: Secondary | ICD-10-CM | POA: Insufficient documentation

## 2020-05-27 DIAGNOSIS — Z951 Presence of aortocoronary bypass graft: Secondary | ICD-10-CM | POA: Insufficient documentation

## 2020-05-27 DIAGNOSIS — Z79899 Other long term (current) drug therapy: Secondary | ICD-10-CM | POA: Insufficient documentation

## 2020-05-27 DIAGNOSIS — R410 Disorientation, unspecified: Secondary | ICD-10-CM

## 2020-05-27 DIAGNOSIS — R29818 Other symptoms and signs involving the nervous system: Secondary | ICD-10-CM | POA: Diagnosis not present

## 2020-05-27 DIAGNOSIS — E1121 Type 2 diabetes mellitus with diabetic nephropathy: Secondary | ICD-10-CM | POA: Diagnosis not present

## 2020-05-27 DIAGNOSIS — U071 COVID-19: Secondary | ICD-10-CM | POA: Insufficient documentation

## 2020-05-27 DIAGNOSIS — I10 Essential (primary) hypertension: Secondary | ICD-10-CM | POA: Diagnosis not present

## 2020-05-27 DIAGNOSIS — R269 Unspecified abnormalities of gait and mobility: Secondary | ICD-10-CM | POA: Diagnosis not present

## 2020-05-27 DIAGNOSIS — I129 Hypertensive chronic kidney disease with stage 1 through stage 4 chronic kidney disease, or unspecified chronic kidney disease: Secondary | ICD-10-CM | POA: Diagnosis not present

## 2020-05-27 DIAGNOSIS — E039 Hypothyroidism, unspecified: Secondary | ICD-10-CM | POA: Diagnosis not present

## 2020-05-27 DIAGNOSIS — R4182 Altered mental status, unspecified: Secondary | ICD-10-CM | POA: Diagnosis not present

## 2020-05-27 DIAGNOSIS — N189 Chronic kidney disease, unspecified: Secondary | ICD-10-CM | POA: Diagnosis not present

## 2020-05-27 DIAGNOSIS — E1122 Type 2 diabetes mellitus with diabetic chronic kidney disease: Secondary | ICD-10-CM | POA: Insufficient documentation

## 2020-05-27 DIAGNOSIS — I2510 Atherosclerotic heart disease of native coronary artery without angina pectoris: Secondary | ICD-10-CM | POA: Diagnosis not present

## 2020-05-27 DIAGNOSIS — Z87891 Personal history of nicotine dependence: Secondary | ICD-10-CM | POA: Diagnosis not present

## 2020-05-27 LAB — CBC WITH DIFFERENTIAL/PLATELET
Abs Immature Granulocytes: 0.02 10*3/uL (ref 0.00–0.07)
Basophils Absolute: 0.1 10*3/uL (ref 0.0–0.1)
Basophils Relative: 1 %
Eosinophils Absolute: 0.6 10*3/uL — ABNORMAL HIGH (ref 0.0–0.5)
Eosinophils Relative: 6 %
HCT: 42.5 % (ref 39.0–52.0)
Hemoglobin: 14.2 g/dL (ref 13.0–17.0)
Immature Granulocytes: 0 %
Lymphocytes Relative: 15 %
Lymphs Abs: 1.3 10*3/uL (ref 0.7–4.0)
MCH: 33.1 pg (ref 26.0–34.0)
MCHC: 33.4 g/dL (ref 30.0–36.0)
MCV: 99.1 fL (ref 80.0–100.0)
Monocytes Absolute: 0.5 10*3/uL (ref 0.1–1.0)
Monocytes Relative: 6 %
Neutro Abs: 6.6 10*3/uL (ref 1.7–7.7)
Neutrophils Relative %: 72 %
Platelets: 176 10*3/uL (ref 150–400)
RBC: 4.29 MIL/uL (ref 4.22–5.81)
RDW: 13.8 % (ref 11.5–15.5)
WBC: 9.2 10*3/uL (ref 4.0–10.5)
nRBC: 0 % (ref 0.0–0.2)

## 2020-05-27 LAB — URINALYSIS, ROUTINE W REFLEX MICROSCOPIC
Bilirubin Urine: NEGATIVE
Glucose, UA: NEGATIVE mg/dL
Ketones, ur: NEGATIVE mg/dL
Leukocytes,Ua: NEGATIVE
Nitrite: NEGATIVE
Protein, ur: 30 mg/dL — AB
Specific Gravity, Urine: 1.03 (ref 1.005–1.030)
pH: 6 (ref 5.0–8.0)

## 2020-05-27 LAB — COMPREHENSIVE METABOLIC PANEL
ALT: 22 U/L (ref 0–44)
AST: 23 U/L (ref 15–41)
Albumin: 4 g/dL (ref 3.5–5.0)
Alkaline Phosphatase: 67 U/L (ref 38–126)
Anion gap: 12 (ref 5–15)
BUN: 20 mg/dL (ref 8–23)
CO2: 25 mmol/L (ref 22–32)
Calcium: 9.9 mg/dL (ref 8.9–10.3)
Chloride: 101 mmol/L (ref 98–111)
Creatinine, Ser: 1.01 mg/dL (ref 0.61–1.24)
GFR, Estimated: >60 mL/min (ref >60)
Glucose, Bld: 170 mg/dL — ABNORMAL HIGH (ref 70–99)
Potassium: 4.3 mmol/L (ref 3.5–5.1)
Sodium: 138 mmol/L (ref 135–145)
Total Bilirubin: 0.6 mg/dL (ref 0.3–1.2)
Total Protein: 7.1 g/dL (ref 6.5–8.1)

## 2020-05-27 LAB — RESP PANEL BY RT-PCR (FLU A&B, COVID) ARPGX2
Influenza A by PCR: NEGATIVE
Influenza B by PCR: NEGATIVE
SARS Coronavirus 2 by RT PCR: POSITIVE — AB

## 2020-05-27 LAB — URINALYSIS, MICROSCOPIC (REFLEX)

## 2020-05-27 LAB — CBG MONITORING, ED: Glucose-Capillary: 160 mg/dL — ABNORMAL HIGH (ref 70–99)

## 2020-05-27 IMAGING — CT CT HEAD CODE STROKE
3 series · 15 of 47 positions shown, 18 images · non-contrast
Comparison: [DATE] and prior.

CLINICAL DATA: Code stroke.  Neuro deficit, acute, stroke suspected

EXAM:
CT HEAD WITHOUT CONTRAST
TECHNIQUE: Contiguous axial images were obtained from the base of the skull
through the vertex without intravenous contrast.

[Series 3: head wo · axial · 0.42mm/px · z∈[+822,+967]mm · 9 of 35 slices shown, 12 images]
[im 3/35  brain]
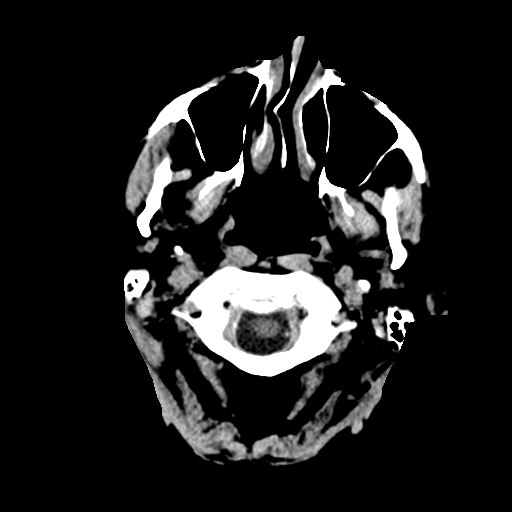
[im 3/35  bone]
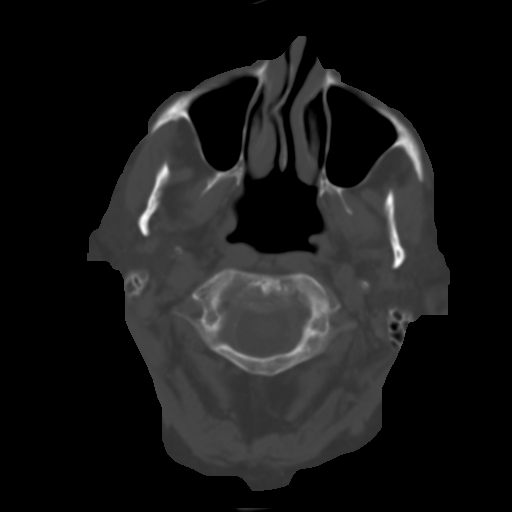
[im 6/35  brain]
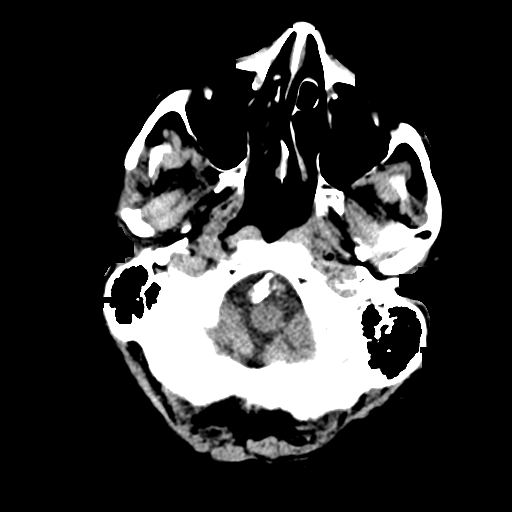
[im 10/35  brain]
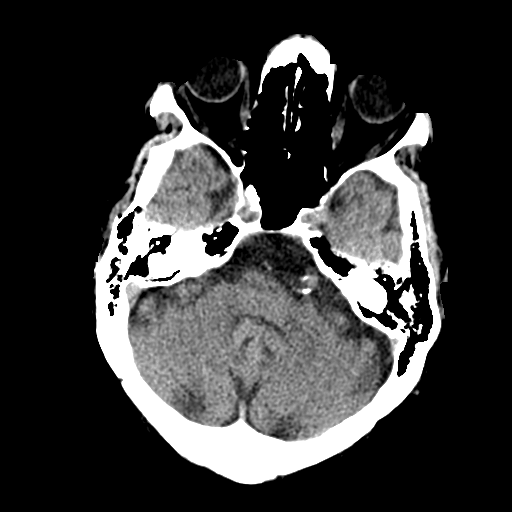
[im 13/35  brain]
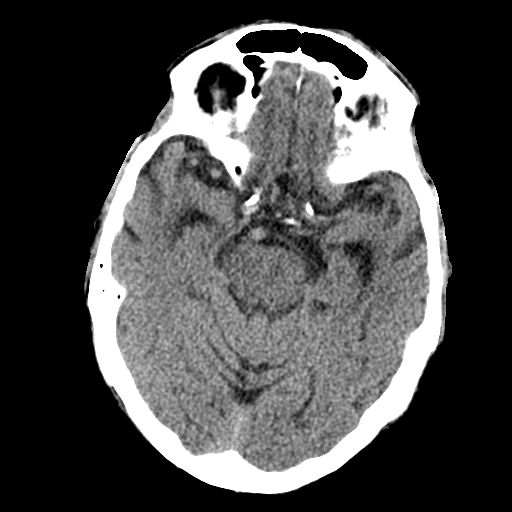
[im 18/35  brain]
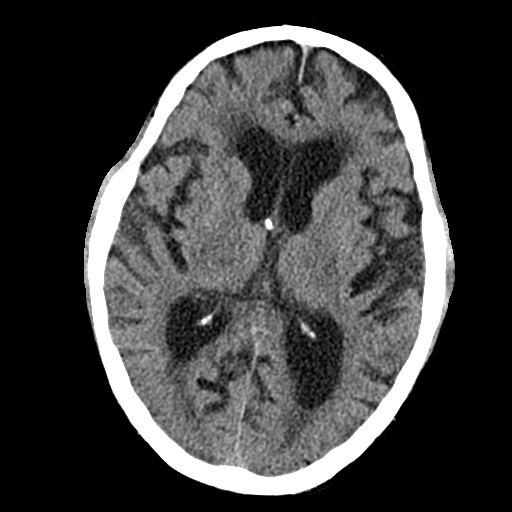
[im 18/35  bone]
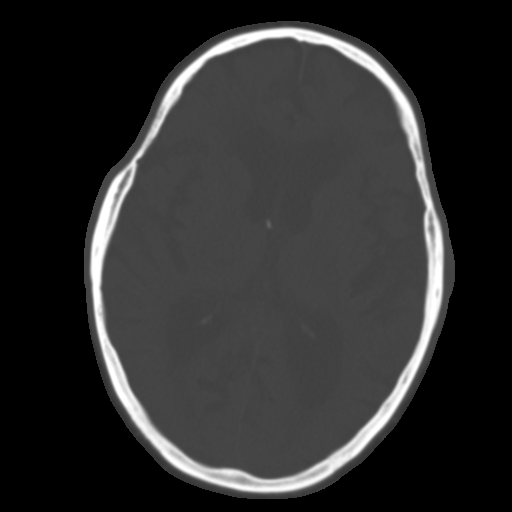
[im 22/35  brain]
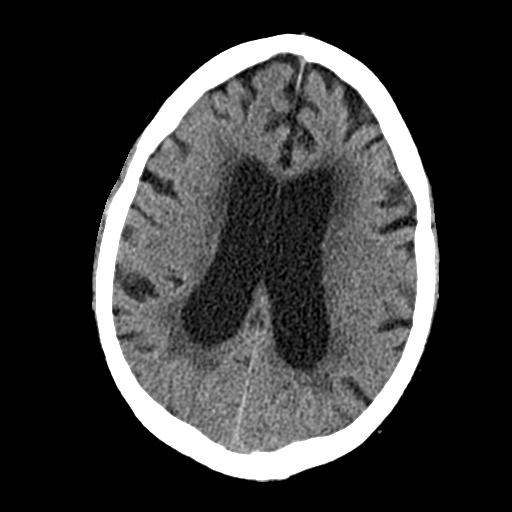
[im 25/35  brain]
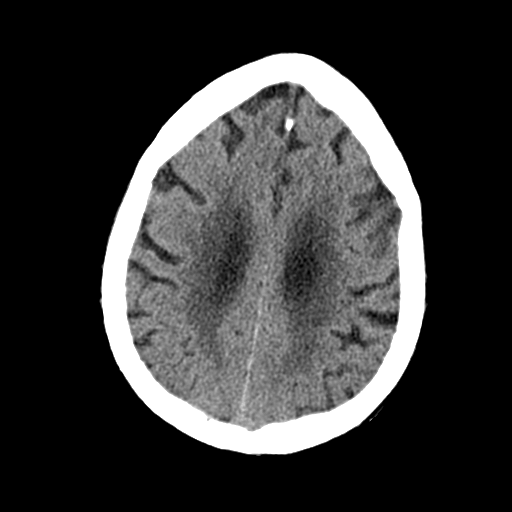
[im 29/35  brain]
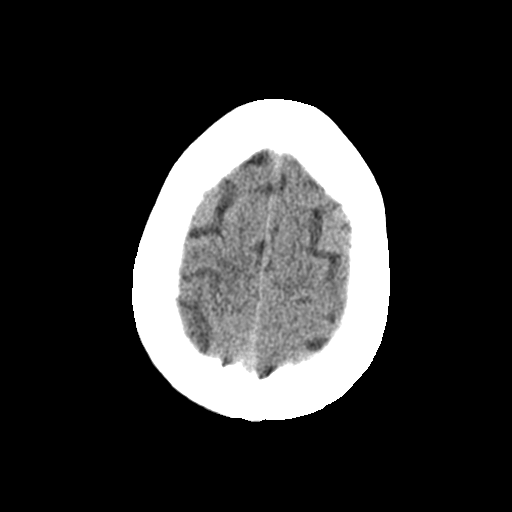
[im 32/35  brain]
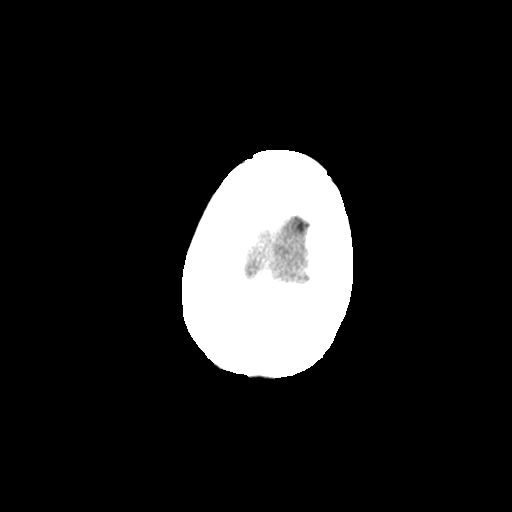
[im 32/35  bone]
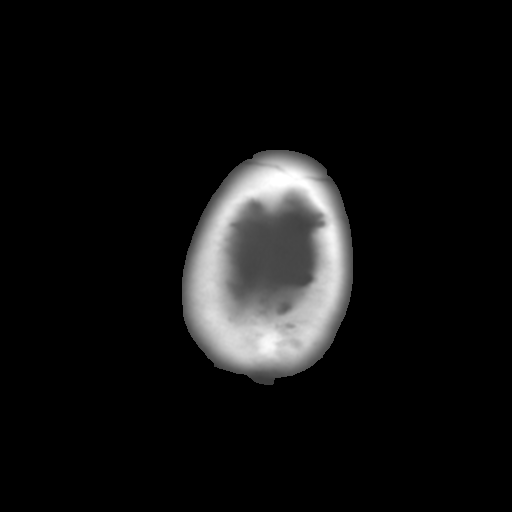

[Series 5: cor soft · coronal · 0.34mm/px · 3 of 84 slices shown]
[im 28/84  brain]
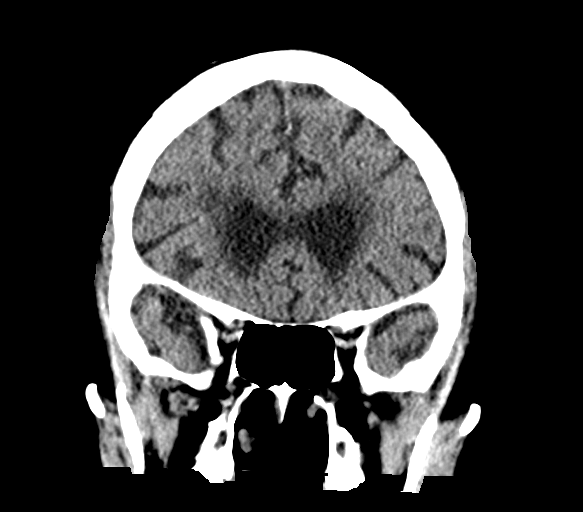
[im 37/84  brain]
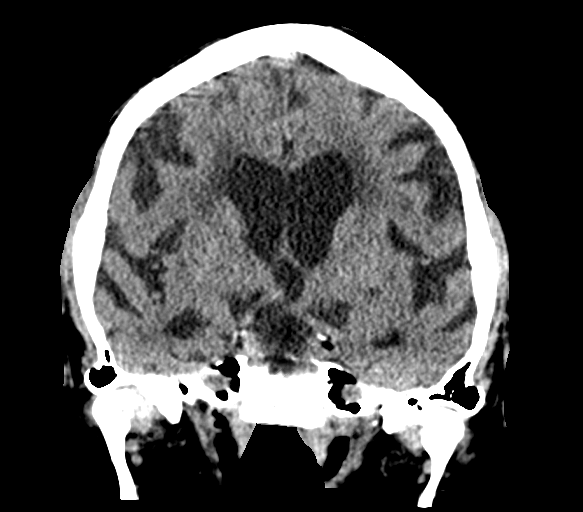
[im 47/84  brain]
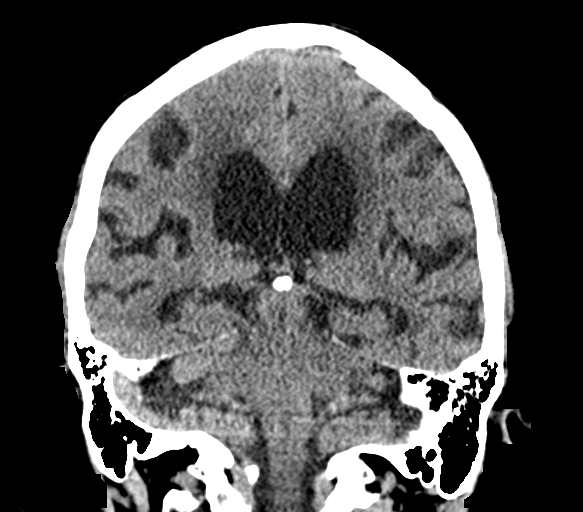

[Series 7: sag soft · sagittal · 0.34mm/px · 3 of 66 slices shown]
[im 22/66  brain]
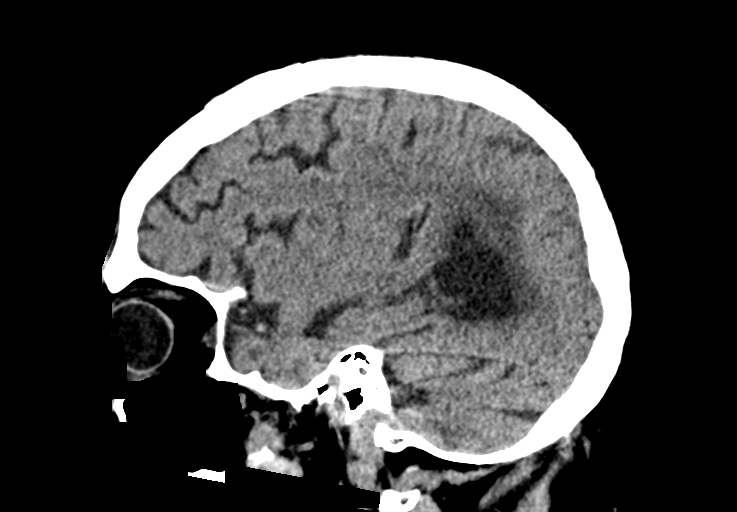
[im 33/66  brain]
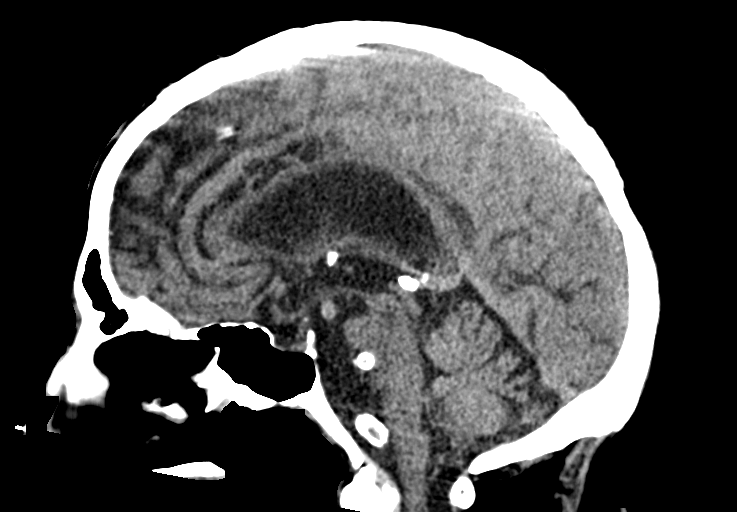
[im 44/66  brain]
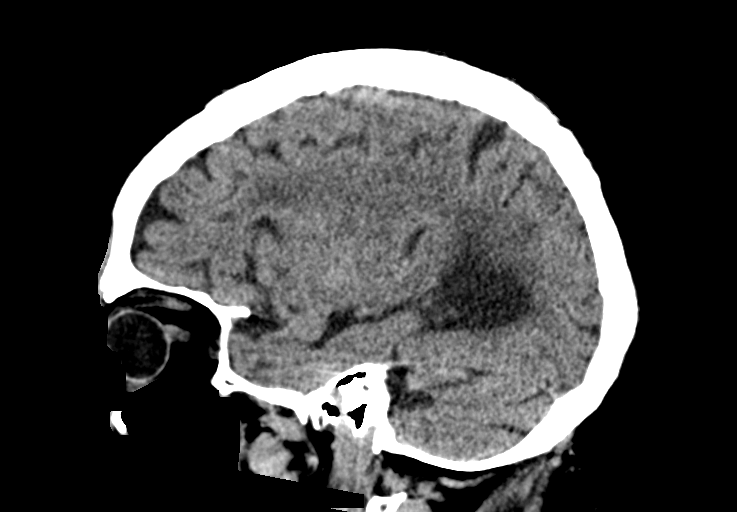

[15 of 47 positions shown; findings below may reference images not displayed]

FINDINGS: Brain: No acute infarct or intracranial hemorrhage. No mass lesion.
No midline shift, ventriculomegaly or extra-axial fluid collection.
Diffuse cerebral atrophy with ex vacuo dilatation. Chronic
microvascular ischemic changes.

Vascular: No hyperdense vessel or unexpected calcification.
Bilateral skull base atherosclerotic calcifications.

Skull: Negative for fracture or focal lesion.

Sinuses/Orbits: Normal orbits. Clear paranasal sinuses. No mastoid
effusion.

Other: None.

ASPECTS (Alberta Stroke Program Early CT Score)

- Ganglionic level infarction (caudate, lentiform nuclei, internal
capsule, insula, M1-M3 cortex): 7

- Supraganglionic infarction (M4-M6 cortex): 3

Total score (0-10 with 10 being normal): 10
IMPRESSION: 1. No acute intracranial process.
2. Mild cerebral atrophy and chronic microvascular ischemic changes.
3. ASPECTS is 10

Code stroke imaging results were communicated on [DATE] at [DATE] to provider Dr. ARCIGA via telephone, who verbally acknowledged
these results.

## 2020-05-27 IMAGING — DX DG CHEST 1V
1 series · 1 of 1 positions shown · non-contrast
Comparison: [DATE]

CLINICAL DATA: Altered mental status

EXAM:
CHEST  1 VIEW

[chest ap]
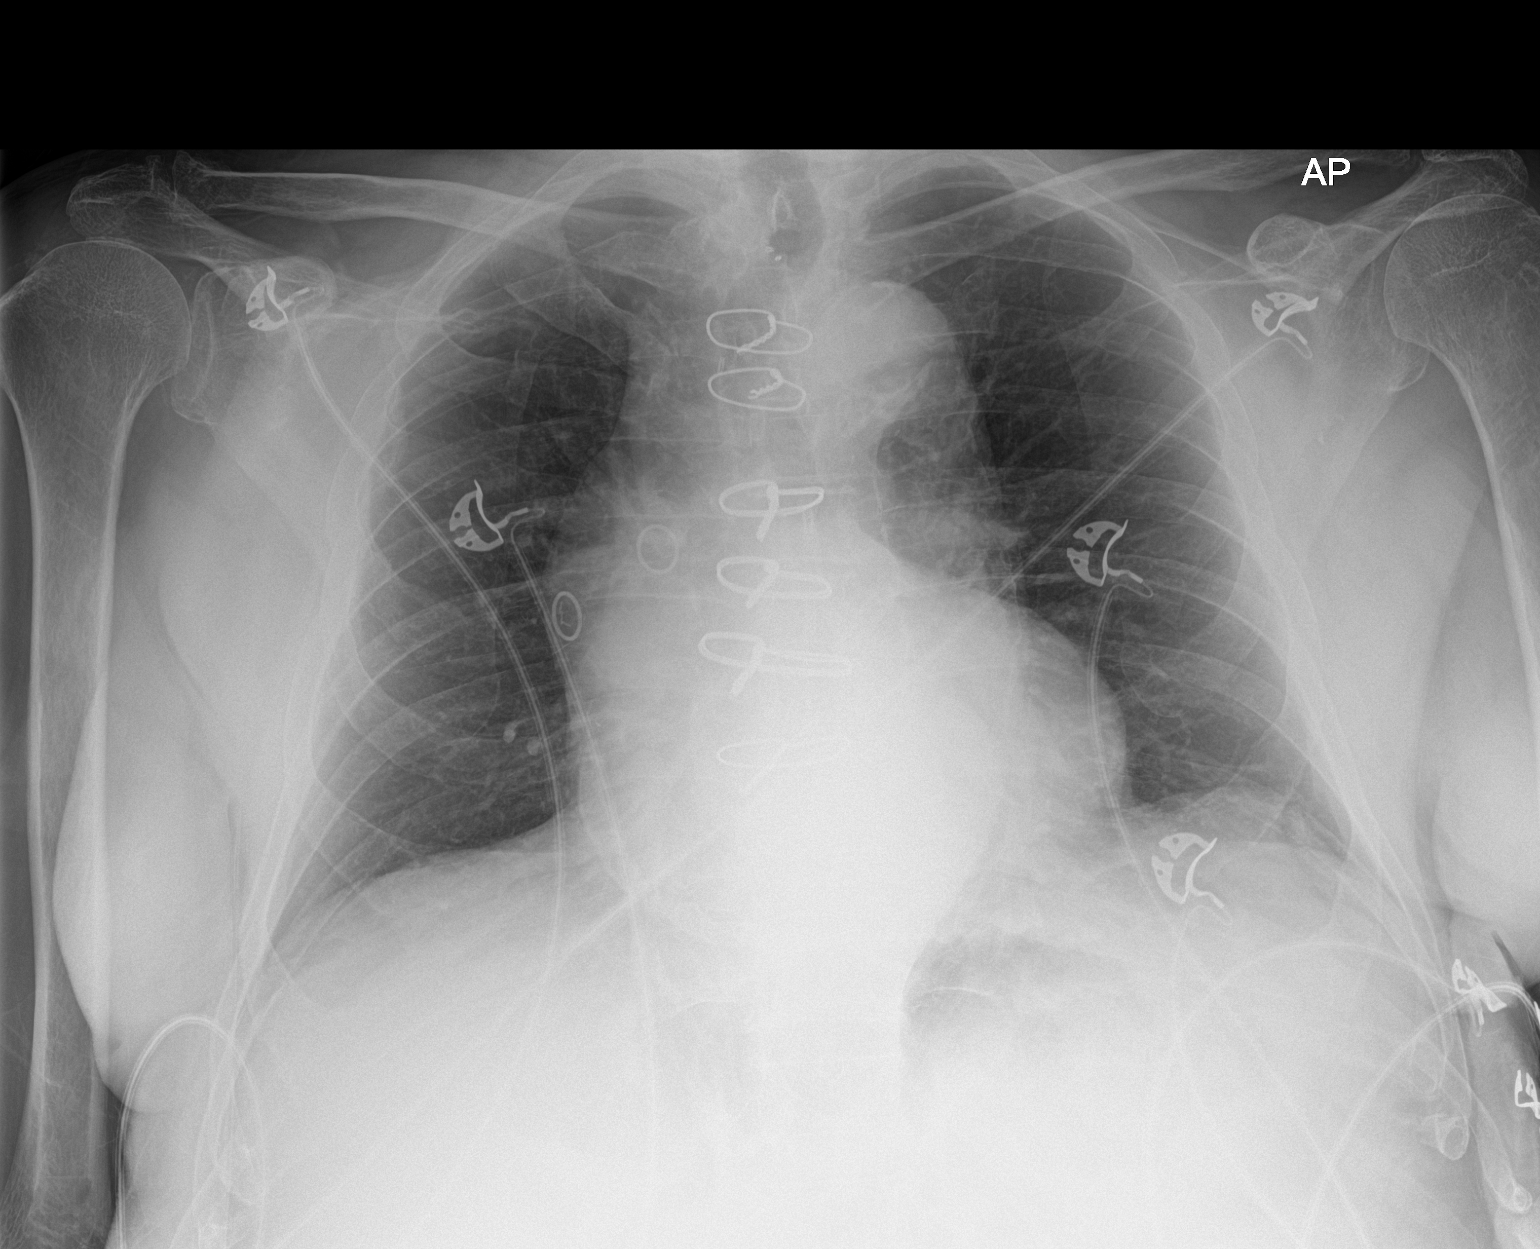

[1 of 1 positions shown; findings below may reference images not displayed]

FINDINGS: Post sternotomy changes. No focal opacity or pleural effusion.
Stable cardiomediastinal silhouette with aortic atherosclerosis. No
pneumothorax.
IMPRESSION: No active disease.

## 2020-05-27 NOTE — Consult Note (Addendum)
[facility] TeleNeurology Consult Services    Date of Service:  05/27/20    Impression: Acute onset of confusion, now resolved.    Metrics: Last Known Well: 4:45 PM TOSO: 4:45 PM  Symptoms: As per HPI.  Patient is not a candidate for thrombolytic due to no acute deficits on exam. NIHSS is 1 due to chronic facial asymmetry.   CT head was reviewed:  1. No acute intracranial process. 2. Mild cerebral atrophy and chronic microvascular ischemic changes. 3. ASPECTS is 10   ED Physician notified of diagnostic impression and management plan at: 6:22 PM   Assessment/Recommendations: - Overall presentation most consistent with AMS due to a cognitive fluctuation in setting of possible incipient dementia. Similar spells have occurred multiple times over the past 1.5 years, all with spontaneous resolution and without cardiovascular symptoms, syncope or seizure-like motor activity - Symptoms not consistent with acute stroke or TIA. No lateralized weakness, sensory loss, expressive or receptive aphasia, abnormal ocular movements, dysarthria, facial droop or ataxia noted by patient or his wife. No focal weakness on exam. Mild facial asymmetry is chronic per wife. EDP noted a wide based gait and some unsteadiness but without clear lateralized weakness. Wife states that his gait unsteadiness is intermittent and has occurred with greater frequency and with gradual worsening over the past 18 months.  - Continue home ASA - Outpatient Neurology follow up with a movement and cognitive disorders specialist to assess for possible incipient synucleinopathy such as Parkinson's disease, or a Parkinson's plus syndrome - Consider outpatient MRI brain and EEG - Outpatient tests that are recommended include B12, copper, TSH, RPR and vitamin E levels - Regular PT as outpatient for therapy to include gait assessment and management          ------------------------------------------------------------------------------   History of Present Illness: Patient with an extensive PMHx as documented below including brain aneurysm, DM, HTN, HLD and CAD, who presents to the ED with acute onset of AMS described by wife as "just not acting himself" that occurred while driving his car after wife had exited a local store while running errands with him. He appeared dazed and also was confused, with clear difficulty driving, almost running into another vehicle while maneuvering in the parking lot. Wife took over driving and then brought him to the ED for evaluation.   He has been dizzy lately. Also with difficulty walking for about 18 months. Ambulation has become gradually slower with smaller steps, but also there have been fluctuations with "bad days and good days" as well as more abrupt worsening of gait and mild AMS followed by spontaneous resolution. Wife has not noticed a wide based gait. He has had controlled falls a "couple of times" and near-falls several times over the past 6 months. He has had symptoms of dizziness when he walks, which wife feels may be a contributing factor. The prominent symptom regarding his gait is the slowness, to the extent that his wife discussed getting a wheelchair with him. He has been to PT a few times. The most recent prior episode with similar symptoms as today's episode was 2-3 months ago.   Of note, He saw a Neurologist for confusion and the walking problem about 1.5 years ago when the patient had a spell similar to the above after his son experienced a severe illness and died. The behavior change lasted for about a day and a half - "he just went completely off the grid" per wife, as though he was not totally aware of  his circumstances or environment.The walking problem comes and goes as does the confusion. The spell of confusion today was one of the worst ones he has had.. In addition to the above, wife endorses  symptoms that are suggestive of a developing apraxia, with patient starting to have increasing difficulty operating a telephone and a TV remote. He has had some mild memory issues such as forgetting names, but has not had trouble recalling recent events. No episodes of wandering or getting lost. No visual hallucinations or abnormal movements while sleeping. He has lost about 30 pounds gradually over the past 2 years.   With today's episode, he had no facial drooping and no arm weakness. He had some leg weakness without definite laterality. He did not have an aphasia in the context of the confusion and decreased apparent level of alertness and awareness. He denies vision loss. No CP, SOB or cough. No lateralized limb weakness or sensory loss. No ataxia. No B/B incontinence. No N/V. No jerking or twitching per patient or wife. In summary, per wife, "he was just confused and not himself." Wife did not notice any worsening of gait today, but EDP observed patient to be unsteady with a wide-based gait. She states that similar episodes in the past have also been seen in conjunction with worsened ambulation in which "he was confused with trouble walking".   He is followed by Dr. [name] for a brain aneurysm. Home medications include daily ASA.     Past Medical History:  Diagnosis Date  . Acute blood loss anemia   . Arthritis   . Brain aneurysm 2018   followed by Dr [name]  . Cataract   . Chronic kidney disease    kidney stone- 40 years ago  . Coronary artery disease   . 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 Diabetes mellitus   . Diverticulitis   . GERD (gastroesophageal reflux disease)   . Hx of abdominal aortic aneurysm 1998  . Hyperlipidemia   . Hypertension   . Lower GI bleed   . Myocardial infarction ([place])    2016  . Personal history of colonic polyps - adenomas 09/09/2013       Social History: Drug Use: No   Family History:  Reviewed in Epic   ROS: As per HPI   Anticoagulant use:  No      Antiplatelet use: Aspirin   Home medications: No current facility-administered medications on file prior to encounter.   Current Outpatient Medications on File Prior to Encounter  Medication Sig Dispense Refill  . aspirin EC 81 MG tablet Take 1 tablet (81 mg total) by mouth daily. 90 tablet 3  . atorvastatin (LIPITOR) 80 MG tablet TAKE 1 TABLET (80 MG TOTAL) BY MOUTH DAILY AT 6 PM. 90 tablet 2  . carvedilol (COREG) 6.25 MG tablet TAKE 1 TABLET BY MOUTH TWICE A DAY 180 tablet 3  . donepezil (ARICEPT) 10 MG tablet Take 1 tablet (10 mg total) by mouth at bedtime. 90 tablet 3  . EPINEPHrine 0.3 mg/0.3 mL IJ SOAJ injection Inject 0.3 mLs (0.3 mg total) into the muscle as needed for anaphylaxis. 2 each 3  . escitalopram (LEXAPRO) 10 MG tablet TAKE 1 TABLET BY MOUTH EVERY DAY 90 tablet 2  . ferrous sulfate 324 MG TBEC Take 324 mg by mouth.    Marland Kitchen glucose blood (FREESTYLE LITE) test strip Check Blood Sugars 1-2 times per day. DX: E11.9. 100 each 5  . Lancets (FREESTYLE) lancets Check Blood Sugars 1-2 times per  day. DX: E11.9 100 each 5  . levothyroxine (SYNTHROID) 75 MCG tablet TAKE 1 TABLET BY MOUTH EVERY DAY 90 tablet 1  . losartan (COZAAR) 50 MG tablet Take 1 tablet (50 mg total) by mouth daily. 90 tablet 3  . metFORMIN (GLUCOPHAGE) 1000 MG tablet TAKE 1 TABLET BY MOUTH TWICE A DAY 180 tablet 3  . Multiple Vitamin (MULTIVITAMIN) tablet Take 1 tablet by mouth daily. Centrium Silver once daily    . vitamin B-12 (CYANOCOBALAMIN) 1000 MCG tablet Take 1,000 mcg by mouth daily.    . vitamin C (ASCORBIC ACID) 500 MG tablet Take 500 mg by mouth every evening.         Examination: BP (!) 149/90 (BP Location: Right Arm)   Pulse 77   Temp 98.3 F (36.8 C) (Oral)   Resp 17   Ht 5\' 8"  (1.727 m)   Wt 70.8 kg   SpO2 98%   BMI 23.72 kg/m   Ment: oriented to Friday, his city of origin and [place], not oriented to the month or the year "2002". Speech is fluent with intact comprehension, but one phonemic  paraphasia was noted when repeating a phrase. Required a significant amount of time to recall the word "hammock" when the picture was displayed to him, and stated "hand" when shown picture of a glove, but could name other items from the stroke assessment booklet without difficulty.   1A: Level of Consciousness - 0 1B: Ask Month and Age - 0 1C: Blink Eyes & Squeeze Hands - 0 2: Test Horizontal Extraocular Movements - 0 3: Test Visual Fields - 0 4: Test Facial Palsy (Use Grimace if Obtunded) - 1 5A: Test Left Arm Motor Drift - 0 5B: Test Right Arm Motor Drift - 0 6A: Test Left Leg Motor Drift - 0 6B: Test Right Leg Motor Drift - 0 7: Test Limb Ataxia (FNF/Heel-Shin) - 0 8: Test Sensation -  0 9: Test Language/Aphasia - 0 10: Test Dysarthria - Severe Dysarthria: 0 11: Test Extinction/Inattention - Extinction to bilateral simultaneous stimulation 0   NIHSS Score: 1  Pre-Morbid Modified Rankin Scale: 0    Patient/Family was informed the Neurology Consult would occur via TeleHealth consult by way of interactive audio and video telecommunications and consented to receiving care in this manner.   Patient is being evaluated for possible acute neurologic impairment and high pretest probability of imminent or life-threatening deterioration. I spent total of 50 minutes providing care to this patient, including time for face to face visit via telemedicine, review of medical records, imaging studies and discussion of findings with providers, the patient and/or family.  Electronically signed: Dr. Kerney Elbe

## 2020-05-27 NOTE — Discharge Instructions (Addendum)
The neurologist has recommended that you follow-up with an outpatient neurologist.  Call the number provided to make an appointment.  COVID-19 isolation recommendations  Patients who have symptoms consistent with COVID-19 should self isolate until: At least 3 days (72 hours) have passed since recovery, defined as resolution of fever without the use of fever reducing medications and improvement in respiratory symptoms (e.g., cough, shortness of breath), and At least 7 days have passed since symptoms first appeared. Retesting is not required and not recommended as patients can continue to test positive for several weeks despite lack of symptoms. If you are asymptomatic, isolation is recommended for 10 days from the date of the positive test.

## 2020-05-27 NOTE — ED Notes (Signed)
ED Provider at bedside. 

## 2020-05-27 NOTE — ED Provider Notes (Signed)
[facility] EMERGENCY DEPARTMENT Provider Note   CSN: [id number] Arrival date & time: 05/27/20  1710     History Chief Complaint  Patient presents with  . Altered Mental Status    Miguel Vazquez is a 80 y.o. male.  HPI      Miguel Vazquez is a 80 y.o. male, with a history of brain aneurysm, hyperlipidemia, HTN, MI, DM, GERD, presenting to the ED with acute onset of confusion.  Last seen normal according to patient's wife at the bedside 4:45 PM today.  Patient was driving at the time.  Wife states patient "suddenly became quiet and seemed disoriented."  She had to give him step-by-step instructions to pull the car to the side of the road.  They switched places and she drove him to the hospital.  Wife denies any recent illness, fevers, vomiting/diarrhea.  Patient denies any headache, falls/trauma, neck/back pain, chest pain, shortness of breath, abdominal pain, urinary symptoms, or any other complaints.   Past Medical History:  Diagnosis Date  . Acute blood loss anemia   . Arthritis   . Brain aneurysm 2018   followed by Dr [name]  . Cataract   . Chronic kidney disease    kidney stone- 40 years ago  . Coronary artery disease   . 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 Diabetes mellitus   . Diverticulitis   . GERD (gastroesophageal reflux disease)   . Hx of abdominal aortic aneurysm 1998  . Hyperlipidemia   . Hypertension   . Lower GI bleed   . Myocardial infarction ([place])    2016  . Personal history of colonic polyps - adenomas 09/09/2013    Patient Active Problem List   Diagnosis Date Noted  . 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 Diverticulitis 06/25/2019  . GI bleed 06/25/2019  . Acute blood loss anemia 06/25/2019  . Femur fracture ([place]) 06/25/2019  . Lower GI bleed 06/25/2019  . Depression, major, single episode, moderate ([place]) 04/15/2019  . Episode of confusion 03/14/2017  . Memory loss 03/14/2017  . Hypothyroidism 02/20/2017  . Pressure injury of skin 09/03/2016  .  Angioedema 09/02/2016  . Cerebral atherosclerosis 01/18/2016  . 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  . Headache 01/06/2016  . Coronary artery disease involving coronary bypass graft of native heart without angina pectoris 12/22/2015  . 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 Cardiomyopathy, 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  . Scrotal inguinal hernia s/p lap repair w mesh 03/15/2015 03/21/2015  . STEMI 03/18/15 03/18/2015  . 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 Obesity (BMI 30-39.9) 11/18/2012  . History of AAA (abdominal aortic aneurysm) repair 11/18/2012  . BACK PAIN, UPPER 08/10/2009  . Type 2 diabetes mellitus with diabetic nephropathy, without long-term current use of insulin ([place]) 10/15/2008  . Hyperlipidemia 10/15/2008  . Essential hypertension 10/15/2008    Past Surgical History:  Procedure Laterality Date  . ABDOMINAL AORTIC ANEURYSM REPAIR  1998  . CARDIAC CATHETERIZATION N/A 03/18/2015   Procedure: Left Heart Cath and Coronary Angiography;  Surgeon: Peter M Martinique, MD;  Location: [facility];  Service: Cardiovascular;  Laterality: N/A;  . cataract surgery Right   . COLONOSCOPY    . CORONARY ARTERY BYPASS GRAFT N/A 03/18/2015   Procedure: CORONARY ARTERY BYPASS GRAFTING (CABG) x  three, using left internal mammary artery and right leg greater saphenous vein harvested endoscopically;  Surgeon: Melrose Nakayama, MD;  Location: [facility];  Service: Open Heart Surgery;  Laterality: N/A;  . INGUINAL HERNIA REPAIR Bilateral   .  INTRAOPERATIVE TRANSESOPHAGEAL ECHOCARDIOGRAM N/A 03/18/2015   Procedure: INTRAOPERATIVE TRANSESOPHAGEAL ECHOCARDIOGRAM;  Surgeon: Melrose Nakayama, MD;  Location: [facility];  Service: Open Heart Surgery;  Laterality: N/A;  . open heart surgery  2016  . UMBILICAL HERNIA REPAIR  1965       Family History  Problem Relation Age of Onset  . Bone cancer Mother   . Diabetes Paternal Aunt   . Stroke Son   . Down syndrome Son   . Colon cancer Neg Hx   . Esophageal  cancer Neg Hx   . Rectal cancer Neg Hx   . Stomach cancer Neg Hx     Social History   Tobacco Use  . Smoking status: Former Smoker    Packs/day: 1.50    Years: 20.00    Pack years: 30.00    Types: Cigarettes    Quit date: 08/24/1986    Years since quitting: 33.7  . Smokeless tobacco: Never Used  Vaping Use  . Vaping Use: Never used  Substance Use Topics  . Alcohol use: Not Currently    Comment: occ  . Drug use: No    Home Medications Prior to Admission medications   Medication Sig Start Date End Date Taking? Authorizing Provider  aspirin EC 81 MG tablet Take 1 tablet (81 mg total) by mouth daily. 06/07/17   Martinique, Peter M, MD  atorvastatin (LIPITOR) 80 MG tablet TAKE 1 TABLET (80 MG TOTAL) BY MOUTH DAILY AT 6 PM. 09/18/19   [name], [name], MD  carvedilol (COREG) 6.25 MG tablet TAKE 1 TABLET BY MOUTH TWICE A DAY 10/29/19   Martinique, Peter M, MD  donepezil (ARICEPT) 10 MG tablet Take 1 tablet (10 mg total) by mouth at bedtime. 06/15/19   [name], [name], MD  EPINEPHrine 0.3 mg/0.3 mL IJ SOAJ injection Inject 0.3 mLs (0.3 mg total) into the muscle as needed for anaphylaxis. 10/30/18   [name], [name], MD  escitalopram (LEXAPRO) 10 MG tablet TAKE 1 TABLET BY MOUTH EVERY DAY 12/01/19   [name], [name], MD  ferrous sulfate 324 MG TBEC Take 324 mg by mouth.    [provider]  glucose blood (FREESTYLE LITE) test strip Check Blood Sugars 1-2 times per day. DX: E11.9. 01/10/16   [name], [name], MD  Lancets (FREESTYLE) lancets Check Blood Sugars 1-2 times per day. DX: E11.9 01/10/16   [name], [name], MD  levothyroxine (SYNTHROID) 75 MCG tablet TAKE 1 TABLET BY MOUTH EVERY DAY 04/13/20   [name], [name], MD  losartan (COZAAR) 50 MG tablet Take 1 tablet (50 mg total) by mouth daily. 03/07/20   [name], [name], MD  metFORMIN (GLUCOPHAGE) 1000 MG tablet TAKE 1 TABLET BY MOUTH TWICE A DAY 09/18/19   [name], [name], MD  Multiple Vitamin (MULTIVITAMIN) tablet Take 1  tablet by mouth daily. Centrium Silver once daily    [provider]  vitamin B-12 (CYANOCOBALAMIN) 1000 MCG tablet Take 1,000 mcg by mouth daily.    [provider]  vitamin C (ASCORBIC ACID) 500 MG tablet Take 500 mg by mouth every evening.     [provider]    Allergies    Food, Shellfish allergy, Prednisone, Tramadol, and Venomil honey bee venom [honey bee venom]  Review of Systems   Review of Systems  Constitutional: Negative for fever.  All other systems reviewed and are negative.   Physical Exam Updated Vital Signs BP (!) 148/108 (BP Location: Right Arm)   Pulse 82  Temp 98.3 F (36.8 C) (Oral)   Resp 16   Ht 5\' 8"  (1.727 m)   Wt 70.8 kg   SpO2 98%   BMI 23.72 kg/m   Physical Exam Vitals and nursing note reviewed.  Constitutional:      General: He is not in acute distress.    Appearance: He is well-developed. He is not diaphoretic.  HENT:     Head: Normocephalic and atraumatic.     Mouth/Throat:     Mouth: Mucous membranes are moist.     Pharynx: Oropharynx is clear.  Eyes:     Conjunctiva/sclera: Conjunctivae normal.  Cardiovascular:     Rate and Rhythm: Normal rate and regular rhythm.     Pulses: Normal pulses.          Radial pulses are 2+ on the right side and 2+ on the left side.       Posterior tibial pulses are 2+ on the right side and 2+ on the left side.     Heart sounds: Normal heart sounds.     Comments: Tactile temperature in the extremities appropriate and equal bilaterally. Pulmonary:     Effort: Pulmonary effort is normal. No respiratory distress.     Breath sounds: Normal breath sounds.  Abdominal:     Palpations: Abdomen is soft.     Tenderness: There is no abdominal tenderness. There is no guarding.  Musculoskeletal:     Cervical back: Neck supple.     Right lower leg: No edema.     Left lower leg: No edema.  Lymphadenopathy:     Cervical: No cervical adenopathy.  Skin:    General: Skin is warm and dry.   Neurological:     Mental Status: He is alert.     Comments: Patient does not seem to have confusion and seems to be oriented.  No noted facial droop. Cranial nerves III through XII grossly intact. Grip strength equal bilaterally. Strength 5/5 in the bilateral extremities. No noted arm drift. No speech deficit.  Handles oral secretions without noted difficulty. Patient initially seems unsteady on his feet when I attempted to walk him.  However, later in the ED course he did not seem to have trouble ambulating.  Psychiatric:        Mood and Affect: Mood and affect normal.        Speech: Speech normal.        Behavior: Behavior normal.     ED Results / Procedures / Treatments   Labs (all labs ordered are listed, but only abnormal results are displayed) Labs Reviewed  RESP PANEL BY RT-PCR (FLU A&B, COVID) ARPGX2 - Abnormal; Notable for the following components:      Result Value   SARS Coronavirus 2 by RT PCR POSITIVE (*)    All other components within normal limits  CBC WITH DIFFERENTIAL/PLATELET - Abnormal; Notable for the following components:   Eosinophils Absolute 0.6 (*)    All other components within normal limits  COMPREHENSIVE METABOLIC PANEL - Abnormal; Notable for the following components:   Glucose, Bld 170 (*)    All other components within normal limits  URINALYSIS, ROUTINE W REFLEX MICROSCOPIC - Abnormal; Notable for the following components:   Hgb urine dipstick TRACE (*)    Protein, ur 30 (*)    All other components within normal limits  URINALYSIS, MICROSCOPIC (REFLEX) - Abnormal; Notable for the following components:   Bacteria, UA RARE (*)    All other components within normal limits  CBG MONITORING, ED - Abnormal; Notable for the following components:   Glucose-Capillary 160 (*)    All other components within normal limits  URINE CULTURE  RAPID URINE DRUG SCREEN, HOSP PERFORMED    EKG EKG Interpretation  Date/Time:  Friday May 27 2020 17:19:14  EST Ventricular Rate:  79 PR Interval:    QRS Duration: 132 QT Interval:  401 QTC Calculation: 460 R Axis:   -2 Text Interpretation: Sinus rhythm IVCD, consider atypical RBBB Abnormal inferior Q waves No significant change since prior 12/18 Confirmed by Aletta Edouard [phone]) on 05/27/2020 5:27:44 PM   Radiology DG Chest 1 View  Result Date: 05/27/2020 CLINICAL DATA:  Altered mental status EXAM: CHEST  1 VIEW COMPARISON:  03/07/2020 FINDINGS: Post sternotomy changes. No focal opacity or pleural effusion. Stable cardiomediastinal silhouette with aortic atherosclerosis. No pneumothorax. IMPRESSION: No active disease. Electronically Signed   By: Donavan Foil M.D.   On: 05/27/2020 17:44   CT HEAD CODE STROKE WO CONTRAST  Result Date: 05/27/2020 CLINICAL DATA:  Code stroke.  Neuro deficit, acute, stroke suspected EXAM: CT HEAD WITHOUT CONTRAST TECHNIQUE: Contiguous axial images were obtained from the base of the skull through the vertex without intravenous contrast. COMPARISON:  02/16/2019 and prior. FINDINGS: Brain: No acute infarct or intracranial hemorrhage. No mass lesion. No midline shift, ventriculomegaly or extra-axial fluid collection. Diffuse cerebral atrophy with ex vacuo dilatation. Chronic microvascular ischemic changes. Vascular: No hyperdense vessel or unexpected calcification. Bilateral skull base atherosclerotic calcifications. Skull: Negative for fracture or focal lesion. Sinuses/Orbits: Normal orbits. Clear paranasal sinuses. No mastoid effusion. Other: None. ASPECTS [facility] Stroke Program Early CT Score) - Ganglionic level infarction (caudate, lentiform nuclei, internal capsule, insula, M1-M3 cortex): 7 - Supraganglionic infarction (M4-M6 cortex): 3 Total score (0-10 with 10 being normal): 10 IMPRESSION: 1. No acute intracranial process. 2. Mild cerebral atrophy and chronic microvascular ischemic changes. 3. ASPECTS is 10 Code stroke imaging results were communicated on 05/27/2020 at  5:42 pm to provider Dr. Melina Copa via telephone, who verbally acknowledged these results. Electronically Signed   By: Primitivo Gauze M.D.   On: 05/27/2020 17:43    Procedures Procedures   Medications Ordered in ED Medications - No data to display  ED Course  I have reviewed the triage vital signs and the nursing notes.  Pertinent labs & imaging results that were available during my care of the patient were reviewed by me and considered in my medical decision making (see chart for details).  Clinical Course as of 05/27/20 2008  Fri May 27, 2020  1825 Spoke with Dr. Earnestine Leys, neurologist.  He was able to evaluate and speak with the patient and his wife. Patient's wife gave much more information to Dr. Cheral Marker then she gave to [place] during initial interview.  It seems as though patient has had episodes of confusion increasing over the past several months.  She reportedly became scared because she thought he was acting oddly, but no longer thinks this to be the case. Dr. Cheral Marker states patient does not need MRI or any further management from a neurologic standpoint here in the ED.  He does recommend the patient follow-up with neurology in the office. [SJ]  [id number] 80 year old male with history of some memory issues here with acute onset of some confusion and unsteady gait.  He was a code stroke work-up.  Initial CT imaging negative.  Teleneurology evaluation felt this was more progressive neurologic waxing and waning and can be appropriate for outpatient work-up.  Covid testing  positive.  Patient is vaccinated and boosted.  Patient and wife comfortable plan for outpatient follow-up with her neurologist. [MB]    Clinical Course User Index [MB] Hayden Rasmussen, MD [SJ] Ceana Fiala, Maceo Pro   MDM Rules/Calculators/A&P                          Patient presents with reported acute onset confusion. He initially presented with unsteady gait that the patient's wife stated was new.  Due to this new  neurologic finding, code stroke was activated. Later, new information came to light, patient was evaluated by the neurologist, and was subsequently cleared. He was noted to have a positive Covid test, though he does not have fever or respiratory symptoms. Patient ambulated independently prior to discharge without assistance. Patient and his wife were given instructions for home care as well as return precautions.  Both parties voice understanding of these instructions, accept the plan, and are comfortable with discharge.  Findings and plan of care discussed with attending physician, Ronnald Ramp, MD. Dr. Melina Copa personally evaluated and examined this patient.  Vitals:   05/27/20 1830 05/27/20 1900 05/27/20 1930 05/27/20 2000  BP: (!) 154/73 (!) 152/91 (!) 154/81 (!) 162/81  Pulse: 80 74 73 74  Resp: 20 19 20 17   Temp:      TempSrc:      SpO2: 99% 98% 98% 98%  Weight:      Height:         Final Clinical Impression(s) / ED Diagnoses Final diagnoses:  COVID-19    Rx / DC Orders ED Discharge Orders    None       Layla Maw 05/27/20 2008    Hayden Rasmussen, MD 05/28/20 1011

## 2020-05-27 NOTE — ED Triage Notes (Addendum)
Witnessed Sudden onset of confusion x 30 mins ago while driving

## 2020-05-27 NOTE — ED Notes (Signed)
Pt stated that he feels fine, no SOB also did not feel dizzy or lightheaded when ambulated.

## 2020-05-28 LAB — RAPID URINE DRUG SCREEN, HOSP PERFORMED
Amphetamines: NOT DETECTED
Barbiturates: NOT DETECTED
Benzodiazepines: NOT DETECTED
Cocaine: NOT DETECTED
Opiates: NOT DETECTED
Tetrahydrocannabinol: NOT DETECTED

## 2020-05-29 LAB — URINE CULTURE: Culture: <10000 — AB

## 2020-05-30 ENCOUNTER — Other Ambulatory Visit: Payer: Self-pay | Admitting: Family Medicine

## 2020-06-02 ENCOUNTER — Other Ambulatory Visit: Payer: Self-pay | Admitting: Family Medicine

## 2020-06-02 DIAGNOSIS — E785 Hyperlipidemia, unspecified: Secondary | ICD-10-CM

## 2020-07-01 ENCOUNTER — Telehealth: Payer: Self-pay | Admitting: Cardiology

## 2020-07-01 ENCOUNTER — Other Ambulatory Visit: Payer: Self-pay

## 2020-07-01 ENCOUNTER — Ambulatory Visit: Payer: PPO | Admitting: Neurology

## 2020-07-01 ENCOUNTER — Encounter: Payer: Self-pay | Admitting: Neurology

## 2020-07-01 VITALS — BP 130/73 | HR 70 | Ht 68.0 in | Wt 152.0 lb

## 2020-07-01 DIAGNOSIS — I255 Ischemic cardiomyopathy: Secondary | ICD-10-CM

## 2020-07-01 DIAGNOSIS — R413 Other amnesia: Secondary | ICD-10-CM

## 2020-07-01 DIAGNOSIS — R410 Disorientation, unspecified: Secondary | ICD-10-CM

## 2020-07-01 DIAGNOSIS — R269 Unspecified abnormalities of gait and mobility: Secondary | ICD-10-CM | POA: Diagnosis not present

## 2020-07-01 DIAGNOSIS — R011 Cardiac murmur, unspecified: Secondary | ICD-10-CM

## 2020-07-01 NOTE — Progress Notes (Signed)
Reason for visit: Memory problems, transient confusion, dizziness  Referring physician: Dr. Kerin Vazquez is a 80 y.o. male  History of present illness:  [name] is a 80 year old right-handed white male with a history of a memory disturbance over the last 4 years, he has been treated with Aricept for this.  He has been seen in the past by Dr. Erlinda Vazquez for episodes of transient confusion.  MRI of the brain previously has shown a moderate level small vessel disease and an EEG showed nonspecific generalized slowing.  The patient does have a history diabetes, hypertension, and cardiac disease with prior MI.  The patient comes in today with his wife.  The wife indicates that he is having some issues with walking, he is slowing down with his walking, he has not had any falls.  He is less active, and less willing to engage in any physical activity.  The patient has had some gradual worsening of his memory.  He has short-term memory issues, he denies issues with remembering names for people or things.  He needs some help keeping up with medications and appointments.  His wife does the finances and always has.  The patient is still operating motor vehicle without much difficulty.  He is not having a lot of problems with directions.  Occasionally he may misplace things about the house.  The patient has noted over the last year or so that he has had some dizziness with sitting up after lying down for long periods of time and with standing if he has been sitting a long period of time.  He has to stand or sit for a few moments and let the dizziness pass and then he can walk.  The patient notes that if he is active during the day, the dizziness is less of a problem.  The patient has had some stress recently, his son with Down syndrome has died recently.  The patient was seen in the emergency room on 27 May 2020 with an episode of sudden alteration in mental status.  A CT scan of the brain was done in the  emergency room was unremarkable.  The wife indicates that he had a sudden change in his behavior, and it took him a day and a half to fully return back to normal.  The patient has had episodes of transient confusion in the past but they have never lasted this long.  He is sent to this office for further evaluation.  Past Medical History:  Diagnosis Date  . Acute blood loss anemia   . Arthritis   . Brain aneurysm 2018   followed by Dr Miguel Vazquez  . Cataract   . Chronic kidney disease    kidney stone- 40 years ago  . Coronary artery disease   . 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 Diabetes mellitus   . Diverticulitis   . GERD (gastroesophageal reflux disease)   . Hx of abdominal aortic aneurysm 1998  . Hyperlipidemia   . Hypertension   . Lower GI bleed   . Myocardial infarction ([place])    2016  . Personal history of colonic polyps - adenomas 09/09/2013    Past Surgical History:  Procedure Laterality Date  . ABDOMINAL AORTIC ANEURYSM REPAIR  1998  . CARDIAC CATHETERIZATION N/A 03/18/2015   Procedure: Left Heart Cath and Coronary Angiography;  Surgeon: Peter M Martinique, MD;  Location: [facility];  Service: Cardiovascular;  Laterality: N/A;  . cataract surgery Right   .  COLONOSCOPY    . CORONARY ARTERY BYPASS GRAFT N/A 03/18/2015   Procedure: CORONARY ARTERY BYPASS GRAFTING (CABG) x  three, using left internal mammary artery and right leg greater saphenous vein harvested endoscopically;  Surgeon: Melrose Nakayama, MD;  Location: [facility];  Service: Open Heart Surgery;  Laterality: N/A;  . INGUINAL HERNIA REPAIR Bilateral   . INTRAOPERATIVE TRANSESOPHAGEAL ECHOCARDIOGRAM N/A 03/18/2015   Procedure: INTRAOPERATIVE TRANSESOPHAGEAL ECHOCARDIOGRAM;  Surgeon: Melrose Nakayama, MD;  Location: [facility];  Service: Open Heart Surgery;  Laterality: N/A;  . open heart surgery  2016  . UMBILICAL HERNIA REPAIR  1965    Family History  Problem Relation Age of Onset  . Bone cancer Mother   . Diabetes  Paternal Aunt   . Stroke Son   . Down syndrome Son   . Colon cancer Neg Hx   . Esophageal cancer Neg Hx   . Rectal cancer Neg Hx   . Stomach cancer Neg Hx     Social history:  reports that he quit smoking about 33 years ago. His smoking use included cigarettes. He has a 30.00 pack-year smoking history. He has never used smokeless tobacco. He reports previous alcohol use. He reports that he does not use drugs.  Medications:  Prior to Admission medications   Medication Sig Start Date End Date Taking? Authorizing Provider  aspirin EC 81 MG tablet Take 1 tablet (81 mg total) by mouth daily. 06/07/17  Yes Martinique, Peter M, MD  atorvastatin (LIPITOR) 80 MG tablet TAKE 1 TABLET (80 MG TOTAL) BY MOUTH DAILY AT 6 PM. 06/03/20  Yes [name], [name], MD  carvedilol (COREG) 6.25 MG tablet TAKE 1 TABLET BY MOUTH TWICE A DAY 10/29/19  Yes Martinique, Peter M, MD  donepezil (ARICEPT) 10 MG tablet TAKE 1 TABLET BY MOUTH EVERYDAY AT BEDTIME 05/30/20  Yes [name], [name], MD  EPINEPHrine 0.3 mg/0.3 mL IJ SOAJ injection Inject 0.3 mLs (0.3 mg total) into the muscle as needed for anaphylaxis. 10/30/18  Yes [name], [name], MD  escitalopram (LEXAPRO) 10 MG tablet TAKE 1 TABLET BY MOUTH EVERY DAY 12/01/19  Yes [name], [name], MD  ferrous sulfate 324 MG TBEC Take 324 mg by mouth.   Yes [provider]  glucose blood (FREESTYLE LITE) test strip Check Blood Sugars 1-2 times per day. DX: E11.9. 01/10/16  Yes [name], [name], MD  Lancets (FREESTYLE) lancets Check Blood Sugars 1-2 times per day. DX: E11.9 01/10/16  Yes [name], [name], MD  levothyroxine (SYNTHROID) 75 MCG tablet TAKE 1 TABLET BY MOUTH EVERY DAY 04/13/20  Yes [name], [name], MD  losartan (COZAAR) 50 MG tablet Take 1 tablet (50 mg total) by mouth daily. 03/07/20  Yes [name], [name], MD  metFORMIN (GLUCOPHAGE) 1000 MG tablet TAKE 1 TABLET BY MOUTH TWICE A DAY 09/18/19  Yes [name], [name], MD  Multiple Vitamin (MULTIVITAMIN) tablet  Take 1 tablet by mouth daily. Centrium Silver once daily   Yes [provider]  vitamin B-12 (CYANOCOBALAMIN) 1000 MCG tablet Take 1,000 mcg by mouth daily.   Yes [provider]  vitamin C (ASCORBIC ACID) 500 MG tablet Take 500 mg by mouth every evening.    Yes [provider]      Allergies  Allergen Reactions  . Food Anaphylaxis    TREE NUTS  . Shellfish Allergy Anaphylaxis  . Prednisone     "psychosis"  . Tramadol Other (See Comments)    hallucinations  . Venomil Honey  Bee Venom [Honey Bee Venom] Swelling    Localized swelling    ROS:  Out of a complete 14 system review of symptoms, the patient complains only of the following symptoms, and all other reviewed systems are negative.  Transient confusion Walking difficulty Dizziness  Blood pressure 130/73, pulse 70, height 5\' 8"  (1.727 m), weight 152 lb (68.9 kg).   Blood pressure, right arm, sitting is 158/78.  Blood pressure, right arm, standing is 140/80.  Physical Exam  General: The patient is alert and cooperative at the time of the examination.  Eyes: Pupils are equal, round, and reactive to light. Discs are flat bilaterally.  Neck: The neck is supple, no carotid bruits are noted.  Respiratory: The respiratory examination is clear.  Cardiovascular: The cardiovascular examination reveals a regular rate and rhythm, a grade IV/VI systolic ejection murmur at the lower left sternal border is noted.  Skin: Extremities are without significant edema.  Neurologic Exam  Mental status: The patient is alert and oriented x 3 at the time of the examination. The Mini-Mental status examination done today shows a total score of 19/30.  Cranial nerves: Facial symmetry is present. There is good sensation of the face to pinprick and soft touch bilaterally. The strength of the facial muscles and the muscles to head turning and shoulder shrug are normal bilaterally. Speech is well enunciated, no aphasia or  dysarthria is noted. Extraocular movements are full. Visual fields are full. The tongue is midline, and the patient has symmetric elevation of the soft palate. No obvious hearing deficits are noted.  Motor: The motor testing reveals 5 over 5 strength of all 4 extremities. Good symmetric motor tone is noted throughout.  Sensory: Sensory testing is intact to pinprick, soft touch, vibration sensation, and position sense on the upper extremities.  With the lower extremities, there appears to be a stocking pattern pinprick sensory deficit up to the knees bilaterally with impairment of position sense in both feet, vibration sensation is symmetric in the feet.  No evidence of extinction is noted.  Coordination: Cerebellar testing reveals good finger-nose-finger bilaterally.  There appears to be some mild dysmetria with heel-to-shin bilaterally.  Gait and station: Gait is wide-based, the patient can walk without assistance.  He has symmetric arm swing bilaterally.  Tandem gait is unsteady.  Romberg is unsteady, the patient does not fall.  Reflexes: Deep tendon reflexes are symmetric, but are somewhat depressed bilaterally. Toes are downgoing bilaterally.   CT head 05/27/20:  IMPRESSION: 1. No acute intracranial process. 2. Mild cerebral atrophy and chronic microvascular ischemic changes. 3. ASPECTS is 10  * CT scan images were reviewed online. I agree with the written report.   EEG 03/20/17:  Impression: This is a mildly abnormal EEG recording secondary to slight background slowing seen throughout the recording.  This is a nonspecific finding, and can be seen in any process that results in a mild toxic or metabolic encephalopathy or any dementing type illness.  No evidence of epileptiform discharges were seen.  Assessment/Plan:  1.  Gait disorder  2.  Postural dizziness  3.  Memory disturbance  4.  Transient confusion  The patient has recently had an episode of transient confusion  lasting about a day and a half.  MRI of the brain will be done for this reason.  He continues to have some troubles with memory.  He reports some postural dizziness that is likely related to a transient drop in blood pressure after sitting or lying down for  long periods of time.  He appears to have sensory alteration in the feet, possibly related to the diabetes that has left him with some gait instability.  There is no evidence of parkinsonism.  The patient does have a cardiac murmur that appears to have worsened over the last year, he will be seeing his cardiologist in the near future.  He will follow up here in 6 months, will follow the memory issues and episodes of confusion over time.  We may consider another EEG study in the future.  We will check blood work today.  Jill Alexanders MD 07/01/2020 10:51 AM  [facility] [address] [place], [place] [address]  Phone [phone] Fax [phone]

## 2020-07-01 NOTE — Telephone Encounter (Signed)
Will forward this message to Dr. Doug Sou covering Nurse Malachy Mood, to follow-up with the pt on call placed earlier by her.  I do see where Cherly placed an order for echo today, for the pt to have done. No notation where a call was placed.  Will forward for further review and follow-up.

## 2020-07-01 NOTE — Telephone Encounter (Signed)
    Pt would like to speak with Malachy Mood, she said her husband received a call from [facility] and wanted to know what it was since pt couldn't remember it. She would like to get a call back today

## 2020-07-01 NOTE — Progress Notes (Signed)
Echo

## 2020-07-04 ENCOUNTER — Telehealth: Payer: Self-pay | Admitting: Neurology

## 2020-07-04 NOTE — Telephone Encounter (Signed)
health team order sent to GI. No auth they will reach out to the patient to schedule.  

## 2020-07-05 ENCOUNTER — Ambulatory Visit
Admission: RE | Admit: 2020-07-05 | Discharge: 2020-07-05 | Disposition: A | Discharge status: Home / Self Care | Payer: PPO | Source: Ambulatory Visit | Attending: Neurology | Admitting: Neurology

## 2020-07-05 ENCOUNTER — Other Ambulatory Visit: Payer: Self-pay

## 2020-07-05 DIAGNOSIS — R410 Disorientation, unspecified: Secondary | ICD-10-CM

## 2020-07-05 LAB — SEDIMENTATION RATE: Sed Rate: 9 mm/hr (ref 0–30)

## 2020-07-05 LAB — RPR: RPR Ser Ql: NONREACTIVE

## 2020-07-05 LAB — VITAMIN B12: Vitamin B-12: >2000 pg/mL — ABNORMAL HIGH (ref 232–1245)

## 2020-07-05 LAB — COPPER, SERUM: Copper: 88 ug/dL (ref 69–132)

## 2020-07-05 NOTE — Telephone Encounter (Signed)
Called patient's wife left message on personal voice mail Dr.Jordan advised ok to have echo done 08/04/20.Advised to keep appointment with him 08/10/20 at 10:00 am.

## 2020-07-08 ENCOUNTER — Ambulatory Visit (HOSPITAL_COMMUNITY): Payer: PPO | Attending: Cardiology

## 2020-07-08 ENCOUNTER — Other Ambulatory Visit: Payer: Self-pay

## 2020-07-08 DIAGNOSIS — R011 Cardiac murmur, unspecified: Secondary | ICD-10-CM | POA: Insufficient documentation

## 2020-07-08 DIAGNOSIS — I255 Ischemic cardiomyopathy: Secondary | ICD-10-CM | POA: Insufficient documentation

## 2020-07-08 LAB — ECHOCARDIOGRAM COMPLETE
AR max vel: 1.26 cm2
AV Area VTI: 1.35 cm2
AV Area mean vel: 1.14 cm2
AV Mean grad: 8.5 mmHg
AV Peak grad: 15.4 mmHg
Ao pk vel: 1.96 m/s
Area-P 1/2: 2.16 cm2
S' Lateral: 2.6 cm

## 2020-07-10 ENCOUNTER — Telehealth: Payer: Self-pay | Admitting: Neurology

## 2020-07-10 NOTE — Telephone Encounter (Signed)
  I called the patient.  MRI of the brain shows at least a moderate level small vessel disease, no obvious recent stroke event that would have explained a transient episode of confusion.  The patient will remain on aspirin, he will monitor his blood pressure, if another episode occurs he is to check a blood sugar immediately.  MRI brain 07/07/20:  IMPRESSION: Abnormal MRI scan of the brain without contrast showing moderate changes of generalized cerebral atrophy and small vessel disease.  No acute abnormalities noted.

## 2020-08-04 ENCOUNTER — Other Ambulatory Visit (HOSPITAL_COMMUNITY): Payer: PPO

## 2020-08-05 NOTE — Progress Notes (Deleted)
08/05/2020 Miguel Vazquez   [birth year]  [id number]  Primary Physician [name], [name], MD Primary Cardiologist: Dr Duff Pozzi Martinique  HPI:  80 y/o male seen for follow up CAD. He has a history of DM, HTN, CRI, and PVD s/p remote AAA R&G, admitted 03/18/15 with a STEMI. At cath he had severe 3V and left main CAD. EF 20--25%. He went for urgent CABG. He tolerated this well. Post op he had PAF and was placed on Amiodarone. Amiodarone has since been discontinued. Echo in January 2017showed EF 35-40%. He was placed on lisinopril but developed hyperkalemia so this was stopped. On his follow up we resumed at a lower dose and potassium levels have been normal.   In February he had a fall and nondisplaced fracture of the right femur. This was treated conservatively.  He was admitted in March 2021 with lower GI bleed due to diverticulitis and diverticular bleed. Treated with antibiotics.   On follow up today he is doing well from a cardiac standpoint. His son who had Down's syndrome died in 03-20-2023. Age 37.  He denies any chest pain, dyspnea, palpitations, edema. He tries to walk regularly. He has noted problems with dizziness. It is worse in the morning and also occurs with exertion. Notes his stamina is not as good.    Current Outpatient Medications  Medication Sig Dispense Refill  . aspirin EC 81 MG tablet Take 1 tablet (81 mg total) by mouth daily. 90 tablet 3  . atorvastatin (LIPITOR) 80 MG tablet TAKE 1 TABLET (80 MG TOTAL) BY MOUTH DAILY AT 6 PM. 90 tablet 2  . carvedilol (COREG) 6.25 MG tablet TAKE 1 TABLET BY MOUTH TWICE A DAY 180 tablet 3  . donepezil (ARICEPT) 10 MG tablet TAKE 1 TABLET BY MOUTH EVERYDAY AT BEDTIME 90 tablet 3  . EPINEPHrine 0.3 mg/0.3 mL IJ SOAJ injection Inject 0.3 mLs (0.3 mg total) into the muscle as needed for anaphylaxis. 2 each 3  . escitalopram (LEXAPRO) 10 MG tablet TAKE 1 TABLET BY MOUTH EVERY DAY 90 tablet 2  . ferrous sulfate 324 MG TBEC Take 324 mg by mouth.    Marland Kitchen  glucose blood (FREESTYLE LITE) test strip Check Blood Sugars 1-2 times per day. DX: E11.9. 100 each 5  . Lancets (FREESTYLE) lancets Check Blood Sugars 1-2 times per day. DX: E11.9 100 each 5  . levothyroxine (SYNTHROID) 75 MCG tablet TAKE 1 TABLET BY MOUTH EVERY DAY 90 tablet 1  . losartan (COZAAR) 50 MG tablet Take 1 tablet (50 mg total) by mouth daily. 90 tablet 3  . metFORMIN (GLUCOPHAGE) 1000 MG tablet TAKE 1 TABLET BY MOUTH TWICE A DAY 180 tablet 3  . Multiple Vitamin (MULTIVITAMIN) tablet Take 1 tablet by mouth daily. Centrium Silver once daily    . vitamin B-12 (CYANOCOBALAMIN) 1000 MCG tablet Take 1,000 mcg by mouth daily.    . vitamin C (ASCORBIC ACID) 500 MG tablet Take 500 mg by mouth every evening.      No current facility-administered medications for this visit.    Allergies  Allergen Reactions  . Food Anaphylaxis    TREE NUTS  . Shellfish Allergy Anaphylaxis  . Prednisone     "psychosis"  . Tramadol Other (See Comments)    hallucinations  . Venomil Honey Bee Venom [Honey Bee Venom] Swelling    Localized swelling    Social History   Socioeconomic History  . Marital status: Married    Spouse name: [name]  . Number  of children: 3  . Years of education: Not on file  . Highest education level: Not on file  Occupational History  . Not on file  Tobacco Use  . Smoking status: Former Smoker    Packs/day: 1.50    Years: 20.00    Pack years: 30.00    Types: Cigarettes    Quit date: 08/24/1986    Years since quitting: 33.9  . Smokeless tobacco: Never Used  Vaping Use  . Vaping Use: Never used  Substance and Sexual Activity  . Alcohol use: Not Currently    Comment: occ  . Drug use: No  . Sexual activity: Not on file  Other Topics Concern  . Not on file  Social History Narrative  . Not on file   Social Determinants of Health   Financial Resource Strain: Low Risk   . Difficulty of Paying Living Expenses: Not hard at all  Food Insecurity: No Food Insecurity   . Worried About Charity fundraiser in the Last Year: Never true  . Ran Out of Food in the Last Year: Never true  Transportation Needs: No Transportation Needs  . Lack of Transportation (Medical): No  . Lack of Transportation (Non-Medical): No  Physical Activity: Inactive  . Days of Exercise per Week: 0 days  . Minutes of Exercise per Session: 0 min  Stress: No Stress Concern Present  . Feeling of Stress : Not at all  Social Connections: Socially Integrated  . Frequency of Communication with Friends and Family: More than three times a week  . Frequency of Social Gatherings with Friends and Family: More than three times a week  . Attends Religious Services: More than 4 times per year  . Active Member of Clubs or Organizations: Yes  . Attends Archivist Meetings: More than 4 times per year  . Marital Status: Married  Human resources officer Violence: Not At Risk  . Fear of Current or Ex-Partner: No  . Emotionally Abused: No  . Physically Abused: No  . Sexually Abused: No     Review of Systems: As noted in HPI All other systems reviewed and are otherwise negative except as noted above.    There were no vitals taken for this visit.  GENERAL:  Well appearing pale WM in NAD HEENT:  PERRL, EOMI, sclera are clear. Oropharynx is clear. NECK:  No jugular venous distention, carotid upstroke brisk and symmetric, no bruits, no thyromegaly or adenopathy LUNGS:  Clear to auscultation bilaterally CHEST:  Unremarkable HEART:  RRR,  PMI not displaced or sustained,S1 and S2 within normal limits, no S3, no S4: no clicks, no rubs, soft 0-6/2 systolic murmur LSB ABD:  Soft, nontender. BS +, no masses or bruits. No hepatomegaly, no splenomegaly EXT:  2 + pulses throughout, no edema, no cyanosis no clubbing SKIN:  Warm and dry.  No rashes NEURO:  Alert and oriented x 3. Cranial nerves II through XII intact. PSYCH:  Cognitively intact   EKG is done today. NSR rate 72. Normal. I have  personally reviewed and interpreted this study.   Lab Results  Component Value Date   WBC 9.2 05/27/2020   HGB 14.2 05/27/2020   HCT 42.5 05/27/2020   PLT 176 05/27/2020   GLUCOSE 170 (H) 05/27/2020   CHOL 125 12/16/2019   TRIG 117 12/16/2019   HDL 45 12/16/2019   LDLDIRECT 84.1 05/21/2013   LDLCALC 60 12/16/2019   ALT 22 05/27/2020   AST 23 05/27/2020   NA 138 05/27/2020  K 4.3 05/27/2020   CL 101 05/27/2020   CREATININE 1.01 05/27/2020   BUN 20 05/27/2020   CO2 25 05/27/2020   TSH 1.88 12/16/2019   INR 1.63 (H) 03/18/2015   HGBA1C 6.0 05/23/2020   MICROALBUR 6.8 (H) 11/21/2009   Echo: 12/13/15: Study Conclusions  - Left ventricle: The cavity size was normal. Wall thickness was   increased in a pattern of mild LVH. Systolic function was mildly   to moderately reduced. The estimated ejection fraction was in the   range of 40% to 45%. Diffuse hypokinesis. There is dyskinesis of   the basalinferolateral myocardium. Doppler parameters are   consistent with abnormal left ventricular relaxation (grade 1   diastolic dysfunction). - Mitral valve: Calcified annulus. - Left atrium: The atrium was mildly dilated. - Pulmonary arteries: Systolic pressure was mildly increased. PA   peak pressure: 32 mm Hg (S).  Impressions:  - Global hypokinesis and dyskinesis of the basal inferior lateral   wall; overall mild to moderate LV dysfunction; grade 1 diastolic   dysfunction; mild LAE; mild TR with mildly elevated pulmonary   pressure.   ASSESSMENT AND PLAN:  1. CAD s/p STEMI in December 2016. Urgent CABG for severe 3 vessel and left main disease. He is asymptomatic. Recommend continue ASA to 81 mg daily, continue beta blocker, and statin.  2. Ischemic cardiomyopathy. EF improved to 40-45%. Well compensated. Will continue Coreg  6.25 mg bid and losartan.   3. Hyperlipidemia. On lipitor. Excellent control.  4. Post op Afib. Resolved. No recurrence  5. DM type 2 with renal  manifestations. Last A1c 6.2%. 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 S/p AAA repair.- remote  8. Diverticular bleed with anemia.  9. Dizziness. No clear cardiac cause.   I will follow up in one year.    Alyric Parkin Martinique MD,FACC  08/05/2020 8:06 AM

## 2020-08-08 ENCOUNTER — Telehealth: Payer: Self-pay | Admitting: Internal Medicine

## 2020-08-08 DIAGNOSIS — K862 Cyst of pancreas: Secondary | ICD-10-CM

## 2020-08-08 NOTE — Telephone Encounter (Signed)
Patients wife called to schedule MRI prefers [facility].

## 2020-08-09 NOTE — Telephone Encounter (Signed)
Patient calling to schedule MRI. °

## 2020-08-10 ENCOUNTER — Telehealth: Payer: Self-pay | Admitting: Cardiology

## 2020-08-10 ENCOUNTER — Ambulatory Visit: Payer: PPO | Admitting: Cardiology

## 2020-08-10 NOTE — Telephone Encounter (Signed)
Called patient spoke to wife okay per DPR.  Patient wife verbalized understanding, thankful for call back.

## 2020-08-10 NOTE — Telephone Encounter (Signed)
Patient's wife would like a nurse to call with the patient's echo results.

## 2020-08-10 NOTE — Telephone Encounter (Signed)
Patient's wife notified that he will be contacted directly to schedule the MRI by [facility] scheduling.

## 2020-08-22 ENCOUNTER — Other Ambulatory Visit: Payer: Self-pay

## 2020-08-22 ENCOUNTER — Encounter: Payer: Self-pay | Admitting: Family Medicine

## 2020-08-22 ENCOUNTER — Ambulatory Visit (INDEPENDENT_AMBULATORY_CARE_PROVIDER_SITE_OTHER): Payer: PPO | Admitting: Family Medicine

## 2020-08-22 VITALS — BP 122/62 | HR 64 | Temp 97.8°F | Wt 155.5 lb

## 2020-08-22 DIAGNOSIS — E1121 Type 2 diabetes mellitus with diabetic nephropathy: Secondary | ICD-10-CM | POA: Diagnosis not present

## 2020-08-22 DIAGNOSIS — I10 Essential (primary) hypertension: Secondary | ICD-10-CM | POA: Diagnosis not present

## 2020-08-22 DIAGNOSIS — E039 Hypothyroidism, unspecified: Secondary | ICD-10-CM

## 2020-08-22 DIAGNOSIS — R413 Other amnesia: Secondary | ICD-10-CM | POA: Diagnosis not present

## 2020-08-22 DIAGNOSIS — K862 Cyst of pancreas: Secondary | ICD-10-CM

## 2020-08-22 LAB — POCT GLYCOSYLATED HEMOGLOBIN (HGB A1C): Hemoglobin A1C: 6.3 % — AB (ref 4.0–5.6)

## 2020-08-22 NOTE — Progress Notes (Signed)
Established Patient Office Visit  Subjective:  Patient ID: Miguel Vazquez, male    DOB: 1941-03-24  Age: 80 y.o. MRN: [id number]  CC:  Chief Complaint  Patient presents with  . Follow-up    Diabetes??    HPI Miguel Vazquez presents for medical follow-up.  He is followed by multiple specialists including cardiology, gastroenterology and neurology.  He has chronic problems including history of CAD, hypertension, cyst of the pancreatic head which is followed by gastroenterology, hypothyroidism, type 2 diabetes, history of depression, history of abdominal aortic aneurysm repair, hyperlipidemia, cognitive impairment.  He has abnormal cystic lesion pancreatic head and has follow-up abdominal MRCP tomorrow for that.  His weight is actually gone up steadily a few pounds in recent months.  Appetite is stable.  No abdominal pain complaints this time.  Back in February he presented to the ER with question of acute on chronic intermittent confusion.  Sounds like this is a repeat episode of similar episodes in the past. CT scan revealed no acute findings.  It was felt that no further neurologic work-up was necessary at that time.  He did have positive COVID testing but wife states he had absolutely no symptoms whatsoever.  Patient does take Aricept 10 mg daily.  They recently saw neurology. MRI of the brain was done which showed atrophy with no acute findings.  Wife has had concerns about gait abnormality with slowing of gait recently.  Neurology felt there is no evidence for Parkinson's disease.  There was some concern that his cardiac murmur had worsened.  He had repeat echocardiogram through cardiology in April and this showed ejection fraction 40 to 45% with only mild aortic stenosis.  This was the same ejection fraction he had an echo back in 2017.  He is not reporting any increased dyspnea with day-to-day activities or any recent syncope.  Does have occasional lightheadedness when he first stands but  usually this improves after he stands for a minute or so.  He has had physical therapy in the past for balance training and patient and wife state they might be willing to reconsider that soon.  Regarding diabetes this is been well controlled.  He is on metformin.  They do have questions regarding continuous monitoring systems and currently they do not monitor his blood sugars very often.  He is on thyroid replacement takes this consistently.  He had TSH last fall which was stable  Past Medical History:  Diagnosis Date  . Acute blood loss anemia   . Arthritis   . Brain aneurysm 2018   followed by Dr [name]  . Cataract   . Chronic kidney disease    kidney stone- 40 years ago  . Coronary artery disease   . 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 Diabetes mellitus   . Diverticulitis   . GERD (gastroesophageal reflux disease)   . Hx of abdominal aortic aneurysm 1998  . Hyperlipidemia   . Hypertension   . Lower GI bleed   . Myocardial infarction ([place])    2016  . Personal history of colonic polyps - adenomas 09/09/2013    Past Surgical History:  Procedure Laterality Date  . ABDOMINAL AORTIC ANEURYSM REPAIR  1998  . CARDIAC CATHETERIZATION N/A 03/18/2015   Procedure: Left Heart Cath and Coronary Angiography;  Surgeon: Peter M Martinique, MD;  Location: [facility];  Service: Cardiovascular;  Laterality: N/A;  . cataract surgery Right   . COLONOSCOPY    . CORONARY ARTERY BYPASS GRAFT  N/A 03/18/2015   Procedure: CORONARY ARTERY BYPASS GRAFTING (CABG) x  three, using left internal mammary artery and right leg greater saphenous vein harvested endoscopically;  Surgeon: Melrose Nakayama, MD;  Location: [facility];  Service: Open Heart Surgery;  Laterality: N/A;  . INGUINAL HERNIA REPAIR Bilateral   . INTRAOPERATIVE TRANSESOPHAGEAL ECHOCARDIOGRAM N/A 03/18/2015   Procedure: INTRAOPERATIVE TRANSESOPHAGEAL ECHOCARDIOGRAM;  Surgeon: Melrose Nakayama, MD;  Location: [facility];  Service: Open Heart  Surgery;  Laterality: N/A;  . open heart surgery  2016  . UMBILICAL HERNIA REPAIR  1965    Family History  Problem Relation Age of Onset  . Bone cancer Mother   . Diabetes Paternal Aunt   . Stroke Son   . Down syndrome Son   . Colon cancer Neg Hx   . Esophageal cancer Neg Hx   . Rectal cancer Neg Hx   . Stomach cancer Neg Hx     Social History   Socioeconomic History  . Marital status: Married    Spouse name: [name]  . Number of children: 3  . Years of education: Not on file  . Highest education level: Not on file  Occupational History  . Not on file  Tobacco Use  . Smoking status: Former Smoker    Packs/day: 1.50    Years: 20.00    Pack years: 30.00    Types: Cigarettes    Quit date: 08/24/1986    Years since quitting: 34.0  . Smokeless tobacco: Never Used  Vaping Use  . Vaping Use: Never used  Substance and Sexual Activity  . Alcohol use: Not Currently    Comment: occ  . Drug use: No  . Sexual activity: Not on file  Other Topics Concern  . Not on file  Social History Narrative  . Not on file   Social Determinants of Health   Financial Resource Strain: Low Risk   . Difficulty of Paying Living Expenses: Not hard at all  Food Insecurity: No Food Insecurity  . Worried About Charity fundraiser in the Last Year: Never true  . Ran Out of Food in the Last Year: Never true  Transportation Needs: No Transportation Needs  . Lack of Transportation (Medical): No  . Lack of Transportation (Non-Medical): No  Physical Activity: Inactive  . Days of Exercise per Week: 0 days  . Minutes of Exercise per Session: 0 min  Stress: No Stress Concern Present  . Feeling of Stress : Not at all  Social Connections: Socially Integrated  . Frequency of Communication with Friends and Family: More than three times a week  . Frequency of Social Gatherings with Friends and Family: More than three times a week  . Attends Religious Services: More than 4 times per year  . Active Member  of Clubs or Organizations: Yes  . Attends Archivist Meetings: More than 4 times per year  . Marital Status: Married  Human resources officer Violence: Not At Risk  . Fear of Current or Ex-Partner: No  . Emotionally Abused: No  . Physically Abused: No  . Sexually Abused: No    Outpatient Medications Prior to Visit  Medication Sig Dispense Refill  . aspirin EC 81 MG tablet Take 1 tablet (81 mg total) by mouth daily. 90 tablet 3  . atorvastatin (LIPITOR) 80 MG tablet TAKE 1 TABLET (80 MG TOTAL) BY MOUTH DAILY AT 6 PM. 90 tablet 2  . carvedilol (COREG) 6.25 MG tablet TAKE 1 TABLET BY MOUTH TWICE A  DAY 180 tablet 3  . donepezil (ARICEPT) 10 MG tablet TAKE 1 TABLET BY MOUTH EVERYDAY AT BEDTIME 90 tablet 3  . EPINEPHrine 0.3 mg/0.3 mL IJ SOAJ injection Inject 0.3 mLs (0.3 mg total) into the muscle as needed for anaphylaxis. 2 each 3  . escitalopram (LEXAPRO) 10 MG tablet TAKE 1 TABLET BY MOUTH EVERY DAY 90 tablet 2  . ferrous sulfate 324 MG TBEC Take 324 mg by mouth.    Marland Kitchen glucose blood (FREESTYLE LITE) test strip Check Blood Sugars 1-2 times per day. DX: E11.9. 100 each 5  . Lancets (FREESTYLE) lancets Check Blood Sugars 1-2 times per day. DX: E11.9 100 each 5  . levothyroxine (SYNTHROID) 75 MCG tablet TAKE 1 TABLET BY MOUTH EVERY DAY 90 tablet 1  . losartan (COZAAR) 50 MG tablet Take 1 tablet (50 mg total) by mouth daily. 90 tablet 3  . metFORMIN (GLUCOPHAGE) 1000 MG tablet TAKE 1 TABLET BY MOUTH TWICE A DAY 180 tablet 3  . Multiple Vitamin (MULTIVITAMIN) tablet Take 1 tablet by mouth daily. Centrium Silver once daily    . vitamin B-12 (CYANOCOBALAMIN) 1000 MCG tablet Take 1,000 mcg by mouth daily.    . vitamin C (ASCORBIC ACID) 500 MG tablet Take 500 mg by mouth every evening.      No facility-administered medications prior to visit.    Allergies  Allergen Reactions  . Food Anaphylaxis    TREE NUTS  . Shellfish Allergy Anaphylaxis  . Prednisone     "psychosis"  . Tramadol Other  (See Comments)    hallucinations  . Venomil Honey Bee Venom [Honey Bee Venom] Swelling    Localized swelling    ROS Review of Systems  Constitutional: Negative for appetite change, chills and fever.  Respiratory: Negative for cough and shortness of breath.   Cardiovascular: Negative for chest pain and leg swelling.  Gastrointestinal: Negative for abdominal pain, nausea and vomiting.  Genitourinary: Negative for dysuria.  Neurological: Negative for headaches.      Objective:    Physical Exam Vitals reviewed.  Constitutional:      Appearance: Normal appearance.  Cardiovascular:     Rate and Rhythm: Normal rate and regular rhythm.     Heart sounds: Murmur heard.    Pulmonary:     Effort: Pulmonary effort is normal.     Breath sounds: Normal breath sounds.  Musculoskeletal:     Right lower leg: No edema.     Left lower leg: No edema.  Neurological:     Mental Status: He is alert.     Comments: No focal weakness.  Gait is slow and somewhat unsteady but not shuffling.     BP 122/62 (BP Location: Left Arm, Patient Position: Sitting, Cuff Size: Normal)   Pulse 64   Temp 97.8 F (36.6 C) (Oral)   Wt 155 lb 8 oz (70.5 kg)   SpO2 96%   BMI 23.64 kg/m  Wt Readings from Last 3 Encounters:  08/22/20 155 lb 8 oz (70.5 kg)  07/01/20 152 lb (68.9 kg)  05/27/20 156 lb (70.8 kg)     Health Maintenance Due  Topic Date Due  . Hepatitis C Screening  Never done  . OPHTHALMOLOGY EXAM  07/13/2015  . FOOT EXAM  09/12/2018    There are no preventive care reminders to display for this patient.  Lab Results  Component Value Date   TSH 1.88 12/16/2019   Lab Results  Component Value Date   WBC 9.2 05/27/2020  HGB 14.2 05/27/2020   HCT 42.5 05/27/2020   MCV 99.1 05/27/2020   PLT 176 05/27/2020   Lab Results  Component Value Date   NA 138 05/27/2020   K 4.3 05/27/2020   CO2 25 05/27/2020   GLUCOSE 170 (H) 05/27/2020   BUN 20 05/27/2020   CREATININE 1.01 05/27/2020    BILITOT 0.6 05/27/2020   ALKPHOS 67 05/27/2020   AST 23 05/27/2020   ALT 22 05/27/2020   PROT 7.1 05/27/2020   ALBUMIN 4.0 05/27/2020   CALCIUM 9.9 05/27/2020   ANIONGAP 12 05/27/2020   GFR 69.66 05/23/2020   Lab Results  Component Value Date   CHOL 125 12/16/2019   Lab Results  Component Value Date   HDL 45 12/16/2019   Lab Results  Component Value Date   LDLCALC 60 12/16/2019   Lab Results  Component Value Date   TRIG 117 12/16/2019   Lab Results  Component Value Date   CHOLHDL 2.8 12/16/2019   Lab Results  Component Value Date   HGBA1C 6.3 (A) 08/22/2020      Assessment & Plan:   #1 type 2 diabetes-remains well controlled with A1c 6.3%.  Wife has questions regarding continuous monitoring system such as freestyle libre or Dexcom.  They will check on insurance coverage and let me know if they are interested in [place] ordering that. -We will plan 46-month follow-up and recheck A1c then  #2 hypothyroidism-on replacement -Recheck TSH at 59-month follow-up  #3 hypertension stable and at goal -Continue current blood pressure medications.  He has follow-up with cardiology soon.  #4 cystic mass head of pancreas.  Patient getting 32-month follow-up abdominal MRCP per to gastroenterology.  Does not have any worrisome symptoms currently such as appetite change or weight loss.  They were reminded about scheduled abdominal MRCP tomorrow  #5 history of cognitive impairment-recent MRI showing generalized cerebral atrophy and small vessel disease but no acute findings.  Continue follow-up with neurology.  #6 history of reduced ejection fraction with recent echo showing EF 40 to 45% which is stable compared to 2017.  Symptomatically stable at this time.  He has follow-up with cardiology later this week  No orders of the defined types were placed in this encounter.   Follow-up: Return in about 6 months (around 02/22/2021).    Carolann Littler, MD

## 2020-08-22 NOTE — Patient Instructions (Signed)
Check to see if your insurance covers either FreeStyle [place] or Dexcom monitoring systems.    Your A1C today is 6.3% which is well controlled.

## 2020-08-23 ENCOUNTER — Ambulatory Visit (HOSPITAL_COMMUNITY)
Admission: RE | Admit: 2020-08-23 | Discharge: 2020-08-23 | Disposition: A | Discharge status: Home / Self Care | Payer: PPO | Source: Ambulatory Visit | Attending: Internal Medicine | Admitting: Internal Medicine

## 2020-08-23 ENCOUNTER — Other Ambulatory Visit: Payer: Self-pay | Admitting: Internal Medicine

## 2020-08-23 DIAGNOSIS — K862 Cyst of pancreas: Secondary | ICD-10-CM

## 2020-08-23 IMAGING — MR MR ABDOMEN WO/W CM MRCP
18 of 21 series · 39 of 48 positions shown · IV contrast (gadavist)
Comparison: [DATE]

CLINICAL DATA: Follow-up pancreatic cyst or pseudocyst

EXAM:
MRI ABDOMEN WITHOUT AND WITH CONTRAST (INCLUDING MRCP)
TECHNIQUE: Multiplanar multisequence MR imaging of the abdomen was performed
both before and after the administration of intravenous contrast.
Heavily T2-weighted images of the biliary and pancreatic ducts were
obtained, and three-dimensional MRCP images were rendered by post
processing.
CONTRAST:  7mL GADAVIST GADOBUTROL 1 MMOL/ML IV SOLN

[Series 3: DWI · axial · 6.0mm · 1.49mm/px · z∈[-122,+130]mm · 2 of 72 slices shown (1 of 2)]
[im 1/72]
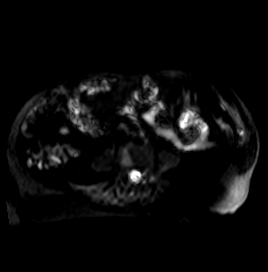
[im 72/72]
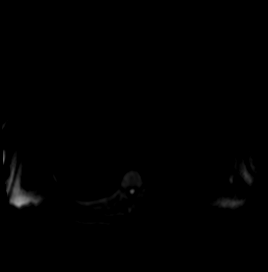

[Series 4: DWI · axial · 6.0mm · 1.49mm/px · 1 of 36 slices shown (2 of 2)]
[im 1/36]
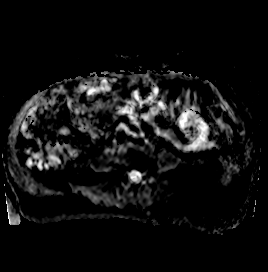

[Series 5: T2 fat-sat · axial · 6.0mm · 1.25mm/px · 1 of 36 slices shown]
[im 1/36]
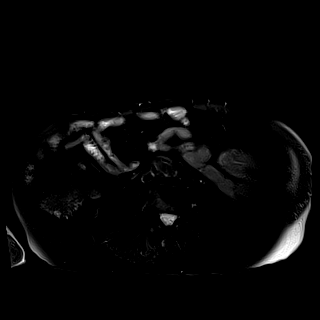

[Series 7: cor_3d_spc_trig-resp · 1 of 6 slices shown]
[im 1/6]
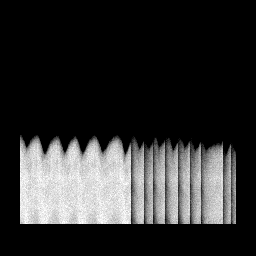

[Series 8: cor_3d_spc_trig · coronal · 1.0mm · 0.49mm/px · 3 of 80 slices shown]
[im 1/80]
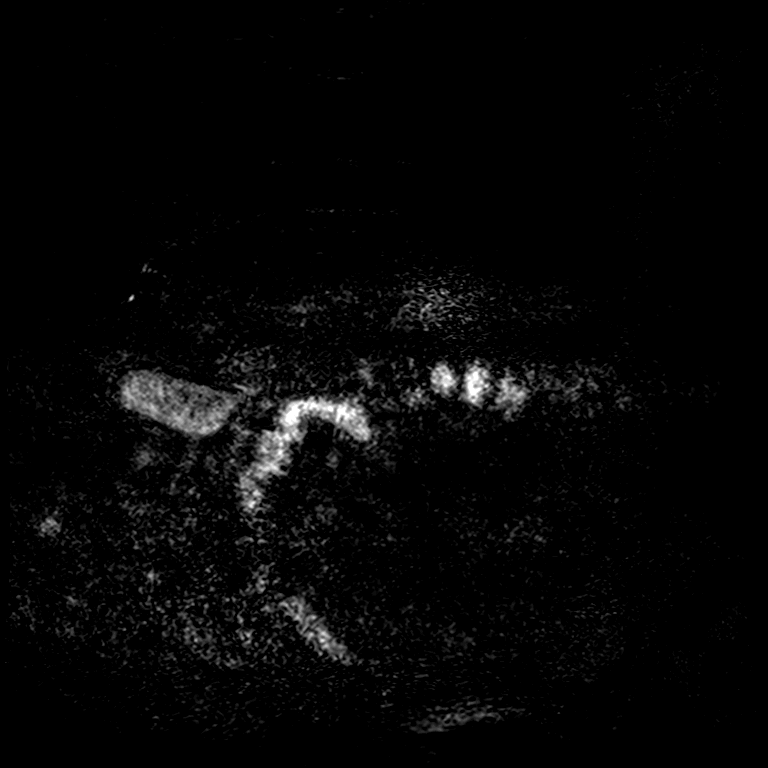
[im 40/80]
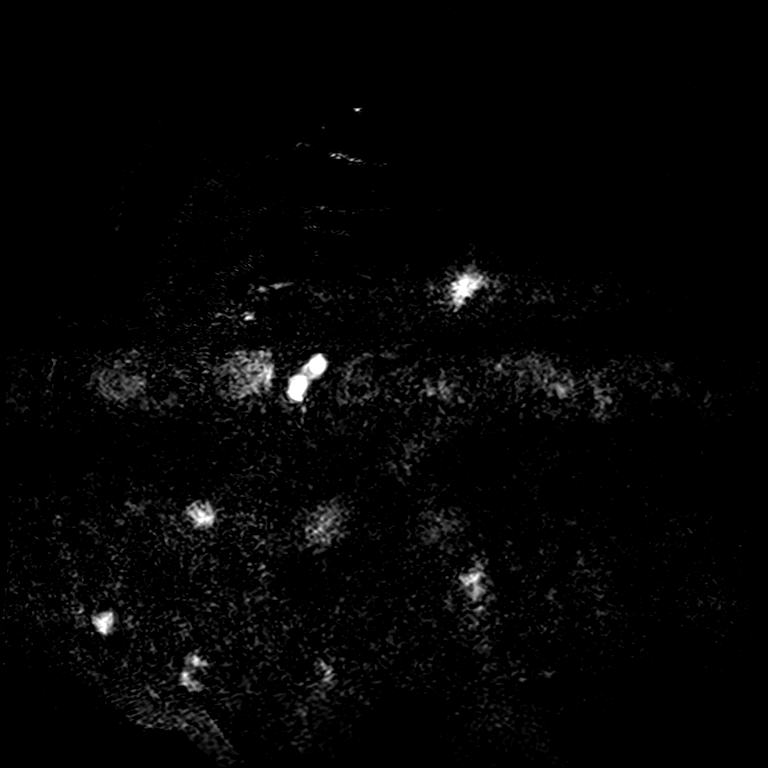
[im 80/80]
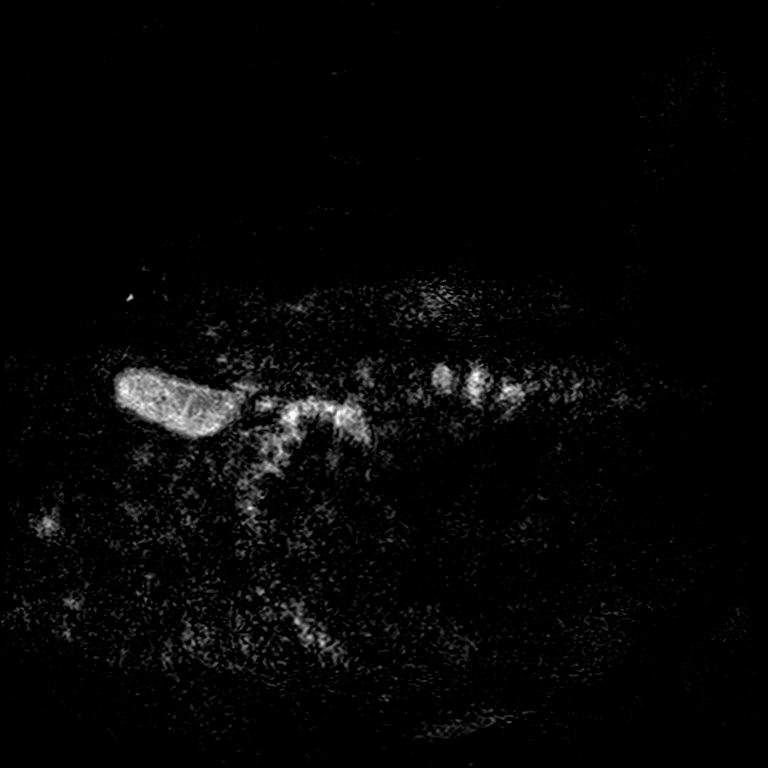

[Series 11: T2 · coronal · 6.0mm · 1.48mm/px · 1 of 30 slices shown (1 of 2)]
[im 1/30]
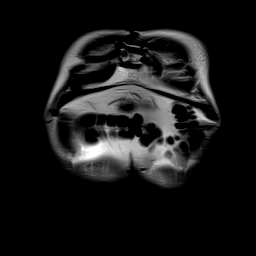

[Series 12: T1 · axial · 3.0mm · 1.25mm/px · z∈[-136,+125]mm · 3 of 88 slices shown (1 of 2)]
[im 1/88]
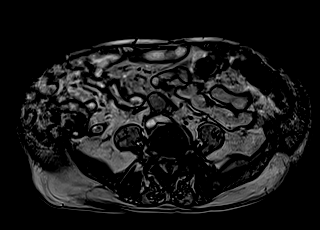
[im 44/88]
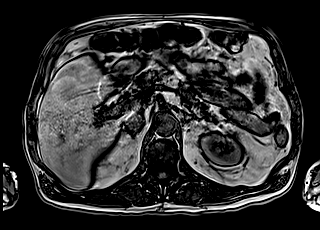
[im 88/88]
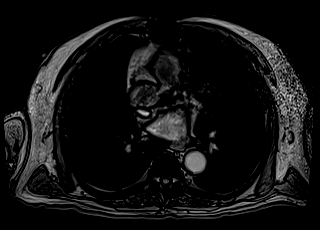

[Series 13: T1 · axial · 3.0mm · 1.25mm/px · z∈[-136,+125]mm · 3 of 88 slices shown (2 of 2)]
[im 1/88]
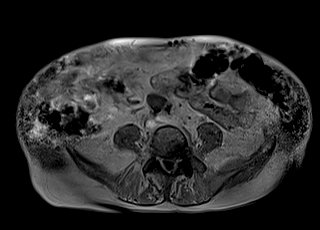
[im 44/88]
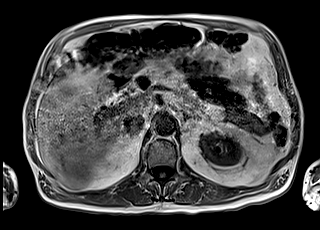
[im 88/88]
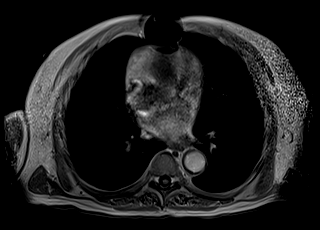

[Series 14: cor obl thk · sagittal · 50.0mm · 0.78mm/px · 1 of 9 slices shown]
[im 1/9]
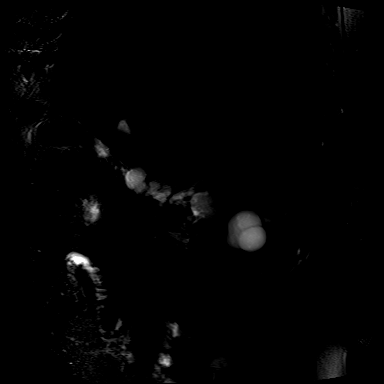

[Series 15: T2 · axial · 6.0mm · 1.56mm/px · 1 of 42 slices shown (2 of 2)]
[im 1/42]
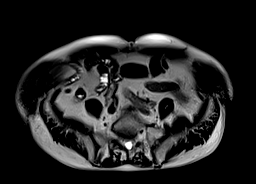

[Series 17: T1 dynamic · axial · 3.0mm · 1.25mm/px · z∈[-157,+128]mm · 3 of 96 slices shown (1 of 8)]
[im 1/96]
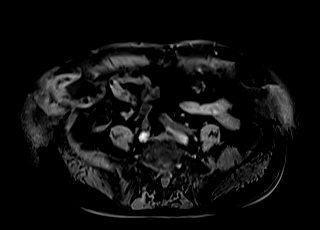
[im 48/96]
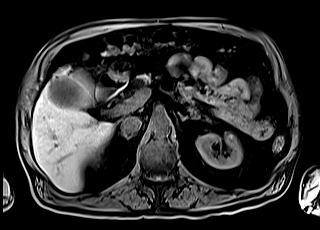
[im 96/96]
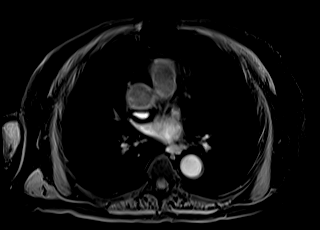

[Series 21: T1 dynamic · axial · 3.0mm · 1.25mm/px · z∈[-157,+128]mm · 3 of 96 slices shown (2 of 8)]
[im 1/96]
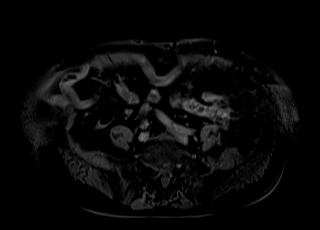
[im 48/96]
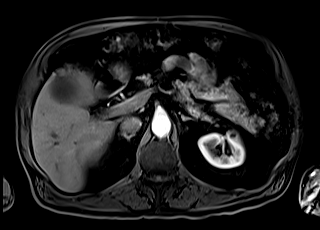
[im 96/96]
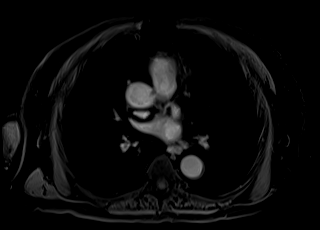

[Series 22: T1 dynamic · axial · 3.0mm · 1.25mm/px · z∈[-157,+128]mm · 3 of 96 slices shown (3 of 8)]
[im 1/96]
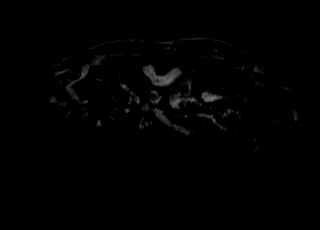
[im 48/96]
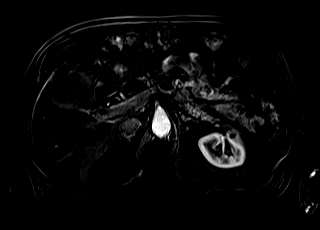
[im 96/96]
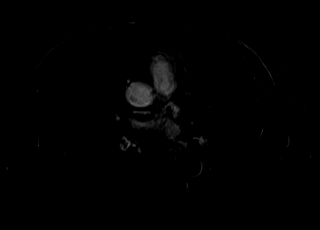

[Series 25: T1 dynamic · axial · 3.0mm · 1.25mm/px · z∈[-157,+128]mm · 3 of 96 slices shown (4 of 8)]
[im 1/96]
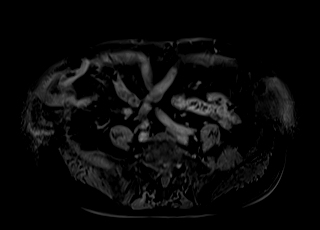
[im 48/96]
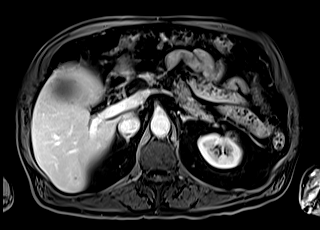
[im 96/96]
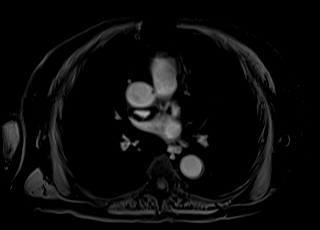

[Series 26: T1 dynamic · axial · 3.0mm · 1.25mm/px · z∈[-157,+128]mm · 3 of 96 slices shown (5 of 8)]
[im 1/96]
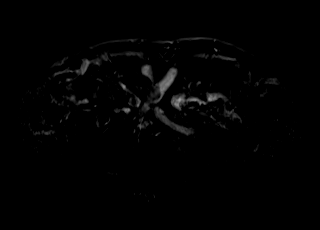
[im 48/96]
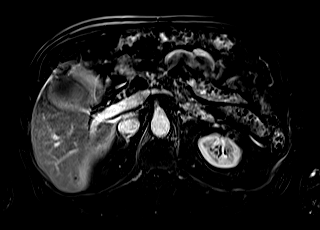
[im 96/96]
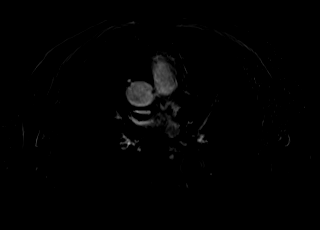

[Series 29: T1 dynamic · axial · 3.0mm · 1.25mm/px · z∈[-157,+128]mm · 3 of 96 slices shown (6 of 8)]
[im 1/96]
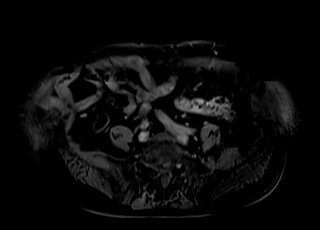
[im 48/96]
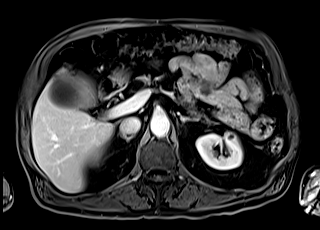
[im 96/96]
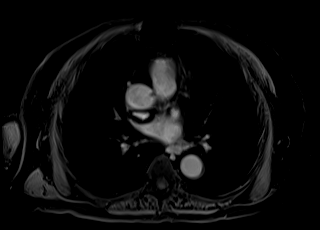

[Series 30: T1 dynamic · axial · 3.0mm · 1.25mm/px · z∈[-157,+128]mm · 3 of 96 slices shown (7 of 8)]
[im 1/96]
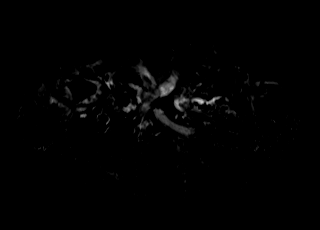
[im 48/96]
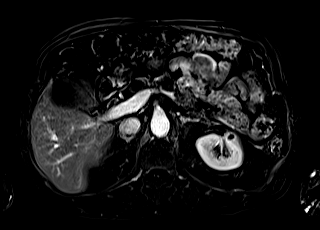
[im 96/96]
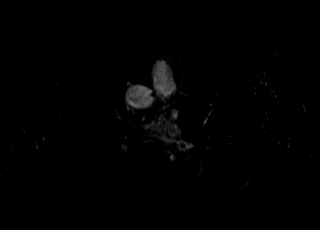

[Series 32: T1 dynamic · coronal · 3.0mm · 1.41mm/px · 1 of 72 slices shown (8 of 8)]
[im 1/72]
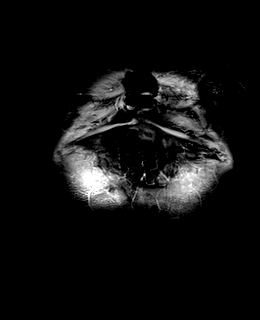

[39 of 48 positions shown; findings below may reference images not displayed]

FINDINGS: Lower chest: No acute findings.

Hepatobiliary: Redemonstrated benign cysts without associated
contrast enhancement. No mass or other parenchymal abnormality
identified.

Pancreas: Redemonstrated multiloculated cystic lesion of the central
pancreatic head measuring 3.8 x 1.8 x 1.8 cm (series 15, image 22,
series 11, image 11). This is unchanged when measured similarly to
prior examination. No solid mass, inflammatory changes, or other
parenchymal abnormality identified.

Spleen:  Within normal limits in size and appearance.

Adrenals/Urinary Tract: No masses identified. Simple bilateral renal
cysts. Cortical scarring of the inferior pole of the right kidney,
in keeping with prior obstructive or infectious insult. No evidence
of hydronephrosis.

Stomach/Bowel: Visualized portions within the abdomen are
unremarkable.

Vascular/Lymphatic: No pathologically enlarged lymph nodes
identified. No abdominal aortic aneurysm demonstrated. Aortic
atherosclerosis.

Other:  None.

Musculoskeletal: No suspicious bone lesions identified.
IMPRESSION: 1. Redemonstrated multiloculated cystic lesion of the central
pancreatic head measuring 3.8 x 1.8 x 1.8 cm. This is unchanged when
measured similarly to prior examination and remains most consistent
with an IPMN or alternately a pseudocyst. No solid mass or
pancreatic ductal dilatation. Recommend follow-up MRI at 6 months to
ensure stability. Given size and recent growth, EUS/FNA can be
considered. This recommendation follows ACR consensus guidelines:
Management of Incidental Pancreatic Cysts: A White Paper of the ACR
Incidental Findings Committee. [HOSPITAL] [4D];[DATE].
2. Cortical scarring of the inferior pole of the right kidney, in
keeping with prior obstructive or infectious insult.

Aortic Atherosclerosis ([4D]-[4D]).

## 2020-08-23 MED ORDER — GADOBUTROL 1 MMOL/ML IV SOLN
7.0000 mL | Freq: Once | INTRAVENOUS | Status: AC | PRN
  Administered 2020-08-23: 7 mL via INTRAVENOUS

## 2020-08-25 ENCOUNTER — Telehealth: Payer: Self-pay | Admitting: Internal Medicine

## 2020-08-25 DIAGNOSIS — K862 Cyst of pancreas: Secondary | ICD-10-CM

## 2020-08-25 NOTE — Telephone Encounter (Signed)
Patient returning your call.

## 2020-08-25 NOTE — Telephone Encounter (Signed)
PT's wife called and stated that they missed a call from Dr. Carlean Purl about the  Pt's results. Please give them a call. Thank you

## 2020-08-26 ENCOUNTER — Other Ambulatory Visit: Payer: Self-pay

## 2020-08-26 ENCOUNTER — Ambulatory Visit: Payer: PPO | Admitting: Family

## 2020-08-26 ENCOUNTER — Encounter: Payer: Self-pay | Admitting: Family

## 2020-08-26 VITALS — BP 120/70 | Resp 18 | Ht 68.0 in | Wt 159.0 lb

## 2020-08-26 DIAGNOSIS — E785 Hyperlipidemia, unspecified: Secondary | ICD-10-CM | POA: Diagnosis not present

## 2020-08-26 DIAGNOSIS — I25118 Atherosclerotic heart disease of native coronary artery with other forms of angina pectoris: Secondary | ICD-10-CM

## 2020-08-26 DIAGNOSIS — Z951 Presence of aortocoronary bypass graft: Secondary | ICD-10-CM | POA: Diagnosis not present

## 2020-08-26 DIAGNOSIS — I350 Nonrheumatic aortic (valve) stenosis: Secondary | ICD-10-CM

## 2020-08-26 DIAGNOSIS — I255 Ischemic cardiomyopathy: Secondary | ICD-10-CM | POA: Diagnosis not present

## 2020-08-26 NOTE — Telephone Encounter (Signed)
I spoke to the patient and his wife.  This still looks like a benign precancerous lesion most likely.  The radiology report is confusing they say it is unchanged but they also would say it is increased in size.  Looking back it may have increased to centimeter from 2.8 to 3.8 cm.  I am going to have radiology review this and get back to them.

## 2020-08-26 NOTE — Patient Instructions (Addendum)
Medication Instructions:  Continue your current medications.  *If you need a refill on your cardiac medications before your next appointment, please call your pharmacy*  Lab Work: None ordered today.   Testing/Procedures: Your physician has requested that you have an echocardiogram in one year. This will let us monitor your mild aortic stenosis. Echocardiography is a painless test that uses sound waves to create images of your heart. It provides your doctor with information about the size and shape of your heart and how well your heart's chambers and valves are working. This procedure takes approximately one hour. There are no restrictions for this procedure.   Follow-Up: At [facility], you and your health needs are our priority.  As part of our continuing mission to provide you with exceptional heart care, we have created designated Provider Care Teams.  These Care Teams include your primary Cardiologist (physician) and Advanced Practice Providers (APPs -  Physician Assistants and Nurse Practitioners) who all work together to provide you with the care you need, when you need it.  We recommend signing up for the patient portal called "MyChart".  Sign up information is provided on this After Visit Summary.  MyChart is used to connect with patients for Virtual Visits (Telemedicine).  Patients are able to view lab/test results, encounter notes, upcoming appointments, etc.  Non-urgent messages can be sent to your provider as well.   To learn more about what you can do with MyChart, go to [email or web address].    Your next appointment:   6 months  The format for your next appointment:   In Person  Provider:   You may see Peter Martinique, MD or one of the following Advanced Practice Providers on your designated Care Team:    Almyra Deforest, PA-C  Fabian Sharp, [place] or   Roby Lofts, [place]  Other Instructions  Our goal is for your blood pressure to be less than 130/80. If your blood  pressure at home is consistently more than that, please call and let us know. You should check your blood pressure about once or twice a week.   Heart Healthy Diet Recommendations: A low-salt diet is recommended. Meats should be grilled, baked, or boiled. Avoid fried foods. Focus on lean protein sources like fish or chicken with vegetables and fruits. The American Heart Association is a Microbiologist!  American Heart Association Diet and Lifeystyle Recommendations   Exercise recommendations: The American Heart Association recommends 150 minutes of moderate intensity exercise weekly. Try 30 minutes of moderate intensity exercise 4-5 times per week. This could include walking, jogging, or swimming.   Aortic Valve Stenosis  Aortic valve stenosis is a narrowing of the aortic valve in the heart. The aortic valve opens and closes to regulate blood flow between the left side of the heart (left ventricle) and the artery that leads away from the heart (aorta). When the aortic valve becomes narrow, it is difficult for the heart to pump blood out to the body, which causes the heart to work harder. The extra work can weaken the heart muscle over time. Aortic valve stenosis can range from mild to severe. If it is not treated, it can become more severe over time and lead to heart failure. What are the causes? This condition may be caused by:  Buildup of calcium around and on the aortic valve. This can occur with aging. This is the most common cause of aortic valve stenosis.  A heart problem that developed in the womb (birth defect).  Rheumatic  fever.  Radiation to the chest. What increases the risk? You may be more likely to develop this condition if:  You are older than age 14.  You were born with an abnormal bicuspid valve. What are the signs or symptoms? You may not have any symptoms until your condition becomes severe. It may take 10-20 years for mild or moderate aortic valve stenosis to  become severe. Symptoms may include:  Shortness of breath. This may get worse during physical activity.  Feeling unusually weak and tired (fatigue).  Extreme discomfort in the chest, neck, or arm during physical activity (angina).  A heartbeat that is irregular or faster than normal (palpitations).  Dizziness or fainting. This may happen when you get physically tired or after you take certain heart medicines, such as nitroglycerin. How is this diagnosed? This condition may be diagnosed with:  A physical exam.  Echocardiogram. This is a type of imaging test that uses sound waves (ultrasound) to make images of your heart. There are two kinds of this test that may be used. ? Transthoracic echocardiogram (TTE). For this type, a wand-like tool (transducer) is moved over your chest to create ultrasound images that are recorded by a computer. ? Transesophageal echocardiogram (TEE). For this type, a flexible tube (probe) is inserted down the part of the body that moves food from your mouth to your stomach (esophagus). The heart and the esophagus are close to each other. Your health care provider will use the probe to take clear, detailed pictures of the heart.  Cardiac catheterization. For this procedure, a small, thin tube (catheter) is passed through a large vein in your neck, groin, or arm. The catheter is used to get information about arteries, structures, blood pressure, and oxygen levels in your heart.  Stress tests. These are tests that evaluate the blood supply to your heart and your heart's response to exercise. You may work with a health care provider who specializes in the heart (cardiologist) for diagnosis and treatment. How is this treated? Treatment depends on how severe your condition is and what your symptoms are. You will need to have your heart checked regularly to make sure that your condition is not getting worse or causing serious problems. Treatment may also include:  Surgery  to replace your aortic valve. This is the most common treatment for aortic valve stenosis, and it is the only treatment to cure the condition. Several types of surgeries are available. The surgery may be done: ? Through a large incision over your heart (open-heart surgery). ? Through small incisions, using a flexible tube called a catheter (transcatheter aortic valve replacement, TAVR).  Medicines that help to keep your heart rate regular.  Medicines that thin your blood (anticoagulants) to prevent blood clots.  Antibiotic medicines to help prevent infection. If your condition is mild, you may only need regular follow-up visits for monitoring. Follow these instructions at home: Lifestyle  Limit alcohol intake to no more than 1 drink a day for nonpregnant women and 2 drinks a day for men. One drink equals 12 oz of beer, 5 oz of wine, or 1 oz of hard liquor.  Do not use any products that contain nicotine or tobacco, such as cigarettes and e-cigarettes. If you need help quitting, ask your health care provider.  Work with your health care provider to manage your blood pressure and cholesterol.  Maintain a healthy weight. Eating and drinking  Eat a heart-healthy diet that includes plenty of fresh fruits and vegetables, whole grains,  lean protein, and low-fat or nonfat dairy.  Limit how much caffeine you drink. Caffeine can affect your heart's rate and rhythm.  Avoid foods that are: ? High in salt (sodium), saturated fat, or sugar. ? Canned or highly processed. ? Fried.  Follow instructions from your health care provider about any other eating or drinking restrictions.   Activity  Exercise regularly and return to your normal activities as told by your health care provider. Ask your health care provider what amount and type of physical activity is safe for you. ? If your aortic valve stenosis is mild, you may only need to avoid very intense physical activity, such as heavy weight  lifting. ? The more severe your aortic valve stenosis is, the more activities you may need to avoid. If you are taking blood thinners:  Before you take any medicines that contain aspirin or NSAIDs, talk with your health care provider. These medicines increase your risk for dangerous bleeding.  Take your medicine exactly as told, at the same time every day.  Avoid activities that could cause injury or bruising, and follow instructions about how to prevent falls.  Wear a medical alert bracelet or carry a card that lists what medicines you take. General instructions  Take over-the-counter and prescription medicines only as told by your health care provider.  If you were prescribed an antibiotic, take it as told by your health care provider. Do not stop taking the antibiotic even if you start to feel better.  If you are a woman and you plan to become pregnant, talk with your health care provider before you become pregnant.  Before you have any type of medical or dental procedure or surgery, tell all health care providers that you have aortic valve stenosis. This may affect the treatment that you receive.  Keep all follow-up visits as told by your health care provider. This is important. Contact a health care provider if:  You have a fever. Get help right away if:  You develop any of the following symptoms: ? Chest pain. ? Chest tightness. ? Shortness of breath. ? Trouble breathing.  You feel light-headed.  You feel like you might faint.  Your heartbeat is irregular or faster than normal. These symptoms may represent a serious problem that is an emergency. Do not wait to see if the symptoms will go away. Get medical help right away. Call your local emergency services (911 in the [place] Do not drive yourself to the hospital. Summary  Aortic valve stenosis is a narrowing of the aortic valve in the heart. The aortic valve opens and closes to regulate blood flow between the left side  of the heart (left ventricle) and the artery that leads away from the heart (aorta).  Aortic valve stenosis can range from mild to severe. If it is not treated, it can become more severe over time and lead to heart failure.  Treatment depends on how severe your condition is and what your symptoms are. You will need to have your heart checked regularly to make sure that your condition is not getting worse or causing serious problems.  Exercise regularly and return to your normal activities as told by your health care provider. Ask your health care provider what amount and type of physical activity is safe for you. This information is not intended to replace advice given to you by your health care provider. Make sure you discuss any questions you have with your health care provider. Document Revised: 09/02/2019 Document Reviewed:  09/02/2019 Elsevier Patient Education  2021 [employer].

## 2020-08-26 NOTE — Progress Notes (Signed)
Office Visit    Patient Name: Miguel Vazquez Date of Encounter: 08/27/2020  PCP:  Eulas Post, MD   [place] [facility]  Cardiologist:  Peter Martinique, MD  Advanced Practice Provider:  No care team member to display Electrophysiologist:  None   Chief Complaint    Miguel Vazquez is a 80 y.o. male with a hx of coronary artery disease s/p CABG, GI bleed, ischemic cardiomyopathy, mild aortic stenosis, DM2, cognitive impairment,  hypertension, CRI, PVD s/p remote AAA RMG presents today for follow-up after echocardiogram  Past Medical History    Past Medical History:  Diagnosis Date  . Acute blood loss anemia   . Arthritis   . Brain aneurysm 2018   followed by Dr [name]  . Cataract   . Chronic kidney disease    kidney stone- 40 years ago  . Coronary artery disease   . 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 Diabetes mellitus   . Diverticulitis   . GERD (gastroesophageal reflux disease)   . Hx of abdominal aortic aneurysm 1998  . Hyperlipidemia   . Hypertension   . Lower GI bleed   . Myocardial infarction ([place])    2016  . Personal history of colonic polyps - adenomas 09/09/2013   Past Surgical History:  Procedure Laterality Date  . ABDOMINAL AORTIC ANEURYSM REPAIR  1998  . CARDIAC CATHETERIZATION N/A 03/18/2015   Procedure: Left Heart Cath and Coronary Angiography;  Surgeon: Peter M Martinique, MD;  Location: [facility];  Service: Cardiovascular;  Laterality: N/A;  . cataract surgery Right   . COLONOSCOPY    . CORONARY ARTERY BYPASS GRAFT N/A 03/18/2015   Procedure: CORONARY ARTERY BYPASS GRAFTING (CABG) x  three, using left internal mammary artery and right leg greater saphenous vein harvested endoscopically;  Surgeon: Melrose Nakayama, MD;  Location: [facility];  Service: Open Heart Surgery;  Laterality: N/A;  . INGUINAL HERNIA REPAIR Bilateral   . INTRAOPERATIVE TRANSESOPHAGEAL ECHOCARDIOGRAM N/A 03/18/2015   Procedure: INTRAOPERATIVE TRANSESOPHAGEAL  ECHOCARDIOGRAM;  Surgeon: Melrose Nakayama, MD;  Location: [facility];  Service: Open Heart Surgery;  Laterality: N/A;  . open heart surgery  2016  . UMBILICAL HERNIA REPAIR  1965    Allergies  Allergies  Allergen Reactions  . Food Anaphylaxis    TREE NUTS  . Shellfish Allergy Anaphylaxis  . Prednisone     "psychosis"  . Tramadol Other (See Comments)    hallucinations  . Venomil Honey Bee Venom [Honey Bee Venom] Swelling    Localized swelling    History of Present Illness    Miguel Vazquez is a 80 y.o. male with a hx of coronary artery disease s/p CABG, GI bleed, ischemic cardiomyopathy, mild aortic stenosis, DM2, cognitive impairment,  hypertension, CRI, PVD s/p remote AAA RMG last seen 08/05/2019 by Dr. Martinique.  He has history of STEMI December 2016 with severe three-vessel disease and left main coronary artery disease with EF 20 to 25%.  He was sent for urgent CABG.  He had postoperative atrial fibrillation placed on amiodarone which was subsequently discontinued.  Echo January 2017 LVEF 35-40%.  He had previous intolerance to lisinopril with hyperkalemia.  However was able to be placed on low-dose with normal potassium level since that time.  Seen in February 2021 with fall and nondisplaced fracture of right femur treated conservatively.  He was admitted March 2021 with lower GI bleed due to diverticulitis and diverticular bleed treated with antibiotics.  He was  last seen May 2021 by Dr. Martinique doing well from cardiac perspective.  He had lost his son the previous December to Down syndrome at the age of 37 and condolences were offered.  His current medications were continued.  Echocardiogram 07/08/2020 with hypokinesis of the inferobasal and dyskinesis of basal anterolateral wall, LVEF 40 to 45%, mild aortic stenosis.  He presents today for follow up with his ewife. Endorses feeling overall well with stable dyspnea on exertion which is attributes to his age. He tells me he has to  "remember I'm not [age]". Denies edema, orhtopnea, PND. No lightheadedness, near syncope, syncope. No chest pain, pressure, tightness. We reviewed his recent echocardiogram in detail and he was appreciative of the greater understanding of the results.  EKGs/Labs/Other Studies Reviewed:   The following studies were reviewed today: Echo 07/08/2020  1. Hypokinesis of the inferobasal wall and dyskinesis of the basal  inferolateral wall; overall mild to moderate LV dysfunction; mild AS.   2. Left ventricular ejection fraction, by estimation, is 40 to 45%. The  left ventricle has mild to moderately decreased function. The left  ventricle demonstrates regional wall motion abnormalities (see scoring  diagram/findings for description). There is   mild left ventricular hypertrophy. Left ventricular diastolic parameters  are consistent with Grade I diastolic dysfunction (impaired relaxation).  The average left ventricular global longitudinal strain is -12.9 %. The  global longitudinal strain is  abnormal.   3. Right ventricular systolic function is normal. The right ventricular  size is normal.   4. The mitral valve is normal in structure. Trivial mitral valve  regurgitation. No evidence of mitral stenosis.   5. The aortic valve is tricuspid. Aortic valve regurgitation is not  visualized. Mild aortic valve stenosis.    EKG:  No EKG today.  Recent Labs: 12/16/2019: TSH 1.88 05/27/2020: ALT 22; BUN 20; Creatinine, Ser 1.01; Hemoglobin 14.2; Platelets 176; Potassium 4.3; Sodium 138  Recent Lipid Panel    Component Value Date/Time   CHOL 125 12/16/2019 1400   CHOL 116 03/14/2017 1300   TRIG 117 12/16/2019 1400   HDL 45 12/16/2019 1400   HDL 45 03/14/2017 1300   CHOLHDL 2.8 12/16/2019 1400   VLDL 19.4 11/07/2018 0947   LDLCALC 60 12/16/2019 1400   LDLDIRECT 84.1 05/21/2013 1106    Home Medications   Current Meds  Medication Sig  . aspirin EC 81 MG tablet Take 1 tablet (81 mg total) by  mouth daily.  Marland Kitchen atorvastatin (LIPITOR) 80 MG tablet TAKE 1 TABLET (80 MG TOTAL) BY MOUTH DAILY AT 6 PM.  . carvedilol (COREG) 6.25 MG tablet TAKE 1 TABLET BY MOUTH TWICE A DAY  . donepezil (ARICEPT) 10 MG tablet TAKE 1 TABLET BY MOUTH EVERYDAY AT BEDTIME  . EPINEPHrine 0.3 mg/0.3 mL IJ SOAJ injection Inject 0.3 mLs (0.3 mg total) into the muscle as needed for anaphylaxis.  Marland Kitchen escitalopram (LEXAPRO) 10 MG tablet TAKE 1 TABLET BY MOUTH EVERY DAY  . ferrous sulfate 324 MG TBEC Take 324 mg by mouth.  Marland Kitchen glucose blood (FREESTYLE LITE) test strip Check Blood Sugars 1-2 times per day. DX: E11.9.  . Lancets (FREESTYLE) lancets Check Blood Sugars 1-2 times per day. DX: E11.9  . levothyroxine (SYNTHROID) 75 MCG tablet TAKE 1 TABLET BY MOUTH EVERY DAY  . losartan (COZAAR) 50 MG tablet Take 1 tablet (50 mg total) by mouth daily.  . metFORMIN (GLUCOPHAGE) 1000 MG tablet TAKE 1 TABLET BY MOUTH TWICE A DAY  . Multiple Vitamin (MULTIVITAMIN)  tablet Take 1 tablet by mouth daily. Centrium Silver once daily  . vitamin B-12 (CYANOCOBALAMIN) 1000 MCG tablet Take 1,000 mcg by mouth daily.  . vitamin C (ASCORBIC ACID) 500 MG tablet Take 500 mg by mouth every evening.      Review of Systems  All other systems reviewed and are otherwise negative except as noted above.  Physical Exam    VS:  BP 120/70 (BP Location: Left Arm, Patient Position: Sitting, Cuff Size: Normal)   Resp 18   Ht 5\' 8"  (1.727 m)   Wt 159 lb (72.1 kg)   SpO2 96%   BMI 24.18 kg/m  , BMI Body mass index is 24.18 kg/m.  Wt Readings from Last 3 Encounters:  08/26/20 159 lb (72.1 kg)  08/22/20 155 lb 8 oz (70.5 kg)  07/01/20 152 lb (68.9 kg)     GEN: Well nourished, well developed, in no acute distress. HEENT: normal. Neck: Supple, no JVD, carotid bruits, or masses. Cardiac: RRR, Gr 2/6 systolic murmur, no  rubs, or gallops. No clubbing, cyanosis, edema.  Radials/DP/PT 2+ and equal bilaterally.  Respiratory:  Respirations regular and  unlabored, clear to auscultation bilaterally. GI: Soft, nontender, nondistended. MS: No deformity or atrophy. Skin: Warm and dry, no rash. Neuro:  Strength and sensation are intact. Psych: Normal affect.  Assessment & Plan    1. CAD s/p CABG - No chest pain, pressure, tightness. GDMT includes aspirin, beta blocker, statin. No indication for ischemic evaluation at this time.   2. ICM - Recent echo 07/2020 with stable LVEF. Euvolemic on exam. No edema, orthopnea, PND. Does note dyspnea on exertion which he attributes to age/deconditioning. Discussed that is ICM also likely contributory. Present GDMT includes Losartan, Carvedilol. He politely declines transition to [place] today and prefers to monitor symptoms. Heart healthy diet and regular cardiovascular exercise encouraged. Discussed cardiac rehab and he tells me he is considering PT with his primary care provider.   3. HLD - Continue Atorvastatin 80mg  daily. Denies myalgias.   4. DM2 - Continue to follow with PCP.  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   6. S/p AAA repair - Continue optimal BP, volume, heart rate control. Continue aspirin, statin.  7. Mild aortic stenosis - Noted by echo 07/2020. Repeat echo 1 year ordered today for monitoring. No chest pain, syncope. Optimal blood pressure control encouraged.   Disposition: Follow up in 6 month(s) with Dr. Martinique or APP.   Signed, Loel Dubonnet, NP 08/27/2020, 11:52 AM [place]

## 2020-09-01 NOTE — Telephone Encounter (Signed)
Reviewed w/ Dr. Laqueta Carina -  Lesion grew from4/21 to 10/21   scans ago but not in last interval 10-21 - 5/22

## 2020-09-04 NOTE — Telephone Encounter (Signed)
Please contact patient and explain my communication and clarification with radiologist about his scan  I recommend that the patient see me in September sop that we can discuss timing of repeat testing.

## 2020-09-05 ENCOUNTER — Ambulatory Visit: Payer: PPO | Attending: Internal Medicine

## 2020-09-05 DIAGNOSIS — Z23 Encounter for immunization: Secondary | ICD-10-CM

## 2020-09-05 NOTE — Progress Notes (Signed)
   Covid-19 Vaccination Clinic  Name:  Miguel Vazquez    MRN: [id number] DOB: 09-18-1940  09/05/2020  Mr. [name] was observed post Covid-19 immunization for 15 minutes without incident. He was provided with Vaccine Information Sheet and instruction to access the V-Safe system.   Mr. [name] was instructed to call 911 with any severe reactions post vaccine: Marland Kitchen Difficulty breathing  . Swelling of face and throat  . A fast heartbeat  . A bad rash all over body  . Dizziness and weakness   Immunizations Administered    Name Date Dose VIS Date Route   PFIZER Comrnaty(Gray TOP) Covid-19 Vaccine 09/05/2020 11:50 AM 0.3 mL 03/10/2020 Intramuscular   Manufacturer: [place]   Lot: [id number]   [place]: [phone]

## 2020-09-05 NOTE — Telephone Encounter (Signed)
Left message for patient to call back  

## 2020-09-06 ENCOUNTER — Other Ambulatory Visit (HOSPITAL_BASED_OUTPATIENT_CLINIC_OR_DEPARTMENT_OTHER): Payer: Self-pay

## 2020-09-06 ENCOUNTER — Other Ambulatory Visit: Payer: Self-pay | Admitting: Family Medicine

## 2020-09-06 DIAGNOSIS — E1121 Type 2 diabetes mellitus with diabetic nephropathy: Secondary | ICD-10-CM

## 2020-09-06 MED ORDER — PFIZER-BIONT COVID-19 VAC-TRIS 30 MCG/0.3ML IM SUSP
INTRAMUSCULAR | 0 refills | Status: DC
  Filled 2020-09-06: qty 0.3, 1d supply, fill #0

## 2020-09-07 NOTE — Telephone Encounter (Signed)
Patient's wife notified of the results and new recommendations He is scheduled for follow up in Sept

## 2020-10-02 ENCOUNTER — Other Ambulatory Visit: Payer: Self-pay | Admitting: Cardiology

## 2020-10-06 ENCOUNTER — Other Ambulatory Visit: Payer: Self-pay | Admitting: Family Medicine

## 2020-10-28 ENCOUNTER — Other Ambulatory Visit: Payer: Self-pay

## 2020-10-28 ENCOUNTER — Encounter: Payer: Self-pay | Admitting: Family Medicine

## 2020-10-28 ENCOUNTER — Ambulatory Visit (INDEPENDENT_AMBULATORY_CARE_PROVIDER_SITE_OTHER): Payer: PPO | Admitting: Family Medicine

## 2020-10-28 VITALS — BP 112/60 | HR 66 | Temp 97.9°F | Ht 68.0 in | Wt 155.3 lb

## 2020-10-28 DIAGNOSIS — R42 Dizziness and giddiness: Secondary | ICD-10-CM | POA: Diagnosis not present

## 2020-10-28 DIAGNOSIS — I10 Essential (primary) hypertension: Secondary | ICD-10-CM

## 2020-10-28 NOTE — Patient Instructions (Signed)
Reduce the Losartan to one half tablet once daily  Continue same dose of Carvedilol  Stay well hydrated.

## 2020-10-28 NOTE — Progress Notes (Signed)
Established Patient Office Visit  Subjective:  Patient ID: Miguel Vazquez, male    DOB: 1940/09/08  Age: 80 y.o. MRN: [id number]  CC:  Chief Complaint  Patient presents with   Dizziness    Patient omplains of dizziness, x2 years,     HPI Miguel Vazquez presents for chief complaint of "dizziness ". Symptoms going on for couple weeks.  Not clear that he is having any consistent lightheadedness.  No syncope.  He states that he sometimes feels "off balance ".  Possibly worse with movement.  Symptoms do seem to be worse with standing.  Denies any focal weakness.  No speech changes.  He has history of ischemic cardiomyopathy.  Ejection fraction by most recent echo 40 to 45%.  He has history of hypertension, cyst of the pancreas, hypothyroidism, type 2 diabetes, hyperlipidemia.  Denies any recent chest pains.  Current blood pressure medications include losartan 50 mg daily and carvedilol 6.25 mg twice daily.  No recent hypoglycemic symptoms.  Last A1c 6.3%.  Recent hemoglobin normal.  Denies any bloody stools.  Has been much less active over the summer and generally tries avoid the heat.  Not doing much walking.  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   Diabetes mellitus    Diverticulitis    GERD (gastroesophageal reflux disease)    Hx of abdominal aortic aneurysm 1998   Hyperlipidemia    Hypertension    Lower GI bleed    Myocardial infarction ([place])    2016   Personal history of colonic polyps - adenomas 09/09/2013    Past Surgical History:  Procedure Laterality Date   ABDOMINAL AORTIC ANEURYSM REPAIR  1998   CARDIAC CATHETERIZATION N/A 03/18/2015   Procedure: Left Heart Cath and Coronary Angiography;  Surgeon: Peter M Martinique, MD;  Location: [facility];  Service:  Cardiovascular;  Laterality: N/A;   cataract surgery Right    COLONOSCOPY     CORONARY ARTERY BYPASS GRAFT N/A 03/18/2015   Procedure: CORONARY ARTERY BYPASS GRAFTING (CABG) x  three, using left internal mammary artery and right leg greater saphenous vein harvested endoscopically;  Surgeon: Melrose Nakayama, MD;  Location: [facility];  Service: Open Heart Surgery;  Laterality: N/A;   INGUINAL HERNIA REPAIR Bilateral    INTRAOPERATIVE TRANSESOPHAGEAL ECHOCARDIOGRAM N/A 03/18/2015   Procedure: INTRAOPERATIVE TRANSESOPHAGEAL ECHOCARDIOGRAM;  Surgeon: Melrose Nakayama, MD;  Location: [facility];  Service: Open Heart Surgery;  Laterality: N/A;   open heart surgery  [id number]   [place]    Family History  Problem Relation Age of Onset   Bone cancer Mother    Diabetes Paternal Aunt    Stroke Son    Down syndrome Son    Colon cancer Neg Hx    Esophageal cancer Neg Hx    Rectal cancer Neg Hx    Stomach cancer Neg Hx     Social History   Socioeconomic History   Marital status: Married    Spouse name: [name]   Number of children: 3   Years of education: Not on file   Highest education level: Not on file  Occupational History   Not on file  Tobacco Use  Smoking status: Former    Packs/day: 1.50    Years: 20.00    Pack years: 30.00    Types: Cigarettes    Quit date: 08/24/1986    Years since quitting: 34.2   Smokeless tobacco: Never  Vaping Use   Vaping Use: Never used  Substance and Sexual Activity   Alcohol use: Not Currently    Comment: occ   Drug use: No   Sexual activity: Not on file  Other Topics Concern   Not on file  Social History Narrative   Not on file   Social Determinants of Health   Financial Resource Strain: Low Risk    Difficulty of Paying Living Expenses: Not hard at all  Food Insecurity: No Food Insecurity   Worried About Charity fundraiser in the Last Year: Never true   [address] in the Last Year: Never true  Transportation  Needs: No Transportation Needs   Lack of Transportation (Medical): No   Lack of Transportation (Non-Medical): No  Physical Activity: Inactive   Days of Exercise per Week: 0 days   Minutes of Exercise per Session: 0 min  Stress: No Stress Concern Present   Feeling of Stress : Not at all  Social Connections: Socially Integrated   Frequency of Communication with Friends and Family: More than three times a week   Frequency of Social Gatherings with Friends and Family: More than three times a week   Attends Religious Services: More than 4 times per year   Active Member of [employer] or Organizations: Yes   Attends Music therapist: More than 4 times per year   Marital Status: Married  Human resources officer Violence: Not At Risk   Fear of Current or Ex-Partner: No   Emotionally Abused: No   Physically Abused: No   Sexually Abused: No    Outpatient Medications Prior to Visit  Medication Sig Dispense Refill   aspirin EC 81 MG tablet Take 1 tablet (81 mg total) by mouth daily. 90 tablet 3   atorvastatin (LIPITOR) 80 MG tablet TAKE 1 TABLET (80 MG TOTAL) BY MOUTH DAILY AT 6 PM. 90 tablet 2   carvedilol (COREG) 6.25 MG tablet TAKE 1 TABLET BY MOUTH TWICE A DAY 180 tablet 3   COVID-19 mRNA Vac-TriS, Pfizer, (PFIZER-BIONT COVID-19 VAC-TRIS) SUSP injection Inject into the muscle. 0.3 mL 0   donepezil (ARICEPT) 10 MG tablet TAKE 1 TABLET BY MOUTH EVERYDAY AT BEDTIME 90 tablet 3   EPINEPHrine 0.3 mg/0.3 mL IJ SOAJ injection Inject 0.3 mLs (0.3 mg total) into the muscle as needed for anaphylaxis. 2 each 3   escitalopram (LEXAPRO) 10 MG tablet TAKE 1 TABLET BY MOUTH EVERY DAY 90 tablet 0   ferrous sulfate 324 MG TBEC Take 324 mg by mouth.     glucose blood (FREESTYLE LITE) test strip Check Blood Sugars 1-2 times per day. DX: E11.9. 100 each 5   Lancets (FREESTYLE) lancets Check Blood Sugars 1-2 times per day. DX: E11.9 100 each 5   levothyroxine (SYNTHROID) 75 MCG tablet TAKE 1 TABLET BY MOUTH  EVERY DAY 90 tablet 0   losartan (COZAAR) 50 MG tablet Take 1 tablet (50 mg total) by mouth daily. (Patient taking differently: Take 25 mg by mouth daily. Take one half tablet by mouth once daily) 90 tablet 3   metFORMIN (GLUCOPHAGE) 1000 MG tablet TAKE 1 TABLET BY MOUTH TWICE A DAY 180 tablet 3   Multiple Vitamin (MULTIVITAMIN) tablet Take 1 tablet by mouth  daily. Centrium Silver once daily     vitamin B-12 (CYANOCOBALAMIN) 1000 MCG tablet Take 1,000 mcg by mouth daily.     vitamin C (ASCORBIC ACID) 500 MG tablet Take 500 mg by mouth every evening.      No facility-administered medications prior to visit.    Allergies  Allergen Reactions   Food Anaphylaxis    TREE NUTS   Shellfish Allergy Anaphylaxis   Prednisone     "psychosis"   Tramadol Other (See Comments)    hallucinations   Venomil Honey Bee Venom [Honey Bee Venom] Swelling    Localized swelling    ROS Review of Systems  Constitutional:  Negative for chills and fever.  Respiratory:  Negative for shortness of breath.   Cardiovascular:  Negative for chest pain.  Gastrointestinal:  Negative for abdominal pain and blood in stool.  Genitourinary:  Negative for dysuria.  Neurological:  Positive for dizziness and light-headedness. Negative for syncope.  Hematological:  Negative for adenopathy.  Psychiatric/Behavioral:  Negative for confusion.      Objective:    Physical Exam Vitals reviewed.  Cardiovascular:     Rate and Rhythm: Normal rate.     Heart sounds: Murmur heard.     Comments: Murmur over the left sternal border-? Slightly more prominent than previous.   Pulmonary:     Effort: Pulmonary effort is normal.     Breath sounds: Normal breath sounds.  Musculoskeletal:     Cervical back: Neck supple.     Right lower leg: No edema.     Left lower leg: No edema.  Neurological:     General: No focal deficit present.     Mental Status: He is alert.     Cranial Nerves: No cranial nerve deficit.     Motor: No  weakness.     Coordination: Coordination normal.     Comments: Dizziness not reproducible in sitting patient to supine with head tilted to the left 45 degrees and right 45 degrees    BP 112/60 (BP Location: Left Arm, Patient Position: Sitting, Cuff Size: Normal)   Pulse 66   Temp 97.9 F (36.6 C) (Oral)   Ht '5\' 8"'$  (1.727 m)   Wt 155 lb 4.8 oz (70.4 kg)   SpO2 94%   BMI 23.61 kg/m  Wt Readings from Last 3 Encounters:  10/28/20 155 lb 4.8 oz (70.4 kg)  08/26/20 159 lb (72.1 kg)  08/22/20 155 lb 8 oz (70.5 kg)     Health Maintenance Due  Topic Date Due   Hepatitis C Screening  Never done   Zoster Vaccines- Shingrix (1 of 2) Never done   OPHTHALMOLOGY EXAM  07/13/2015   FOOT EXAM  09/12/2018    There are no preventive care reminders to display for this patient.  Lab Results  Component Value Date   TSH 1.88 12/16/2019   Lab Results  Component Value Date   WBC 9.2 05/27/2020   HGB 14.2 05/27/2020   HCT 42.5 05/27/2020   MCV 99.1 05/27/2020   PLT 176 05/27/2020   Lab Results  Component Value Date   NA 138 05/27/2020   K 4.3 05/27/2020   CO2 25 05/27/2020   GLUCOSE 170 (H) 05/27/2020   BUN 20 05/27/2020   CREATININE 1.01 05/27/2020   BILITOT 0.6 05/27/2020   ALKPHOS 67 05/27/2020   AST 23 05/27/2020   ALT 22 05/27/2020   PROT 7.1 05/27/2020   ALBUMIN 4.0 05/27/2020   CALCIUM 9.9 05/27/2020   ANIONGAP  12 05/27/2020   GFR 69.66 05/23/2020   Lab Results  Component Value Date   CHOL 125 12/16/2019   Lab Results  Component Value Date   HDL 45 12/16/2019   Lab Results  Component Value Date   LDLCALC 60 12/16/2019   Lab Results  Component Value Date   TRIG 117 12/16/2019   Lab Results  Component Value Date   CHOLHDL 2.8 12/16/2019   Lab Results  Component Value Date   HGBA1C 6.3 (A) 08/22/2020      Assessment & Plan:   Patient presents with dizziness past couple weeks.  Symptoms are somewhat vague.  Does not demonstrate any orthostatic change.   Seated blood pressure 115/68 and this went down to 108/68 standing.  No reproducible dizziness on exam.  Nonfocal neuro exam.  May be having some intermittent vestibular issues but not reproducible  His wife has concerns whether his blood pressure is too low.  We did agree to reducing his losartan to 1/2 tablet daily and continue current dose of carvedilol.  They are also encouraged to set up follow-up with cardiology soon to reassess.  Clinically does not appear anemic and we decided not to do any labs today.  Set up 3-week follow-up and sooner for any problems before then    Follow-up: Return in about 3 weeks (around 11/18/2020).    Carolann Littler, MD

## 2020-11-18 ENCOUNTER — Ambulatory Visit (INDEPENDENT_AMBULATORY_CARE_PROVIDER_SITE_OTHER): Payer: PPO | Admitting: Family Medicine

## 2020-11-18 ENCOUNTER — Encounter: Payer: Self-pay | Admitting: Family Medicine

## 2020-11-18 ENCOUNTER — Other Ambulatory Visit: Payer: Self-pay

## 2020-11-18 VITALS — BP 132/66 | HR 69 | Temp 98.0°F | Wt 152.3 lb

## 2020-11-18 DIAGNOSIS — R42 Dizziness and giddiness: Secondary | ICD-10-CM

## 2020-11-18 DIAGNOSIS — E1121 Type 2 diabetes mellitus with diabetic nephropathy: Secondary | ICD-10-CM | POA: Diagnosis not present

## 2020-11-18 DIAGNOSIS — I10 Essential (primary) hypertension: Secondary | ICD-10-CM

## 2020-11-18 MED ORDER — LOSARTAN POTASSIUM 25 MG PO TABS: 25.0000 mg | ORAL_TABLET | Freq: Every day | ORAL | 3 refills | Status: DC

## 2020-11-18 NOTE — Progress Notes (Signed)
Established Patient Office Visit  Subjective:  Patient ID: Miguel Vazquez, male    DOB: 09-14-1940  Age: 80 y.o. MRN: [id number]  CC:  Chief Complaint  Patient presents with   Follow-up    Vertigo, pt states it has improved but still comes and goes,     HPI Miguel Vazquez presents for follow-up regarding recent vertigo symptoms.  He states that overall his vertigo has improved and in fact he not had any for the past 4 or 5 days until some mild symptoms this morning.  He denies any consistent orthostatic symptoms.  He sometimes has dizziness when he looks upward.  He had carotid Doppler 2018 which did not show any significant vertebral stenosis.  He denies any ataxia.  No focal weakness.  No confusion.  No headaches.  We had reduced his losartan from 50 mg to 25 mg last visit as blood pressure did drop slightly to 108/68 standing.  He has not had any orthostatic symptoms since then.  No recent chest pains.  He has type 2 diabetes.  Recent A1c 6.3%.  Remains on metformin.  Denies any side effects from medication.  No significant polyuria or polydipsia.  Appetite and weight stable.    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   Diabetes mellitus    Diverticulitis    GERD (gastroesophageal reflux disease)    Hx of abdominal aortic aneurysm 1998   Hyperlipidemia    Hypertension    Lower GI bleed    Myocardial infarction ([place])    2016   Personal history of colonic polyps - adenomas 09/09/2013    Past Surgical History:  Procedure Laterality Date   ABDOMINAL AORTIC ANEURYSM REPAIR  1998   CARDIAC CATHETERIZATION N/A 03/18/2015   Procedure: Left Heart Cath and Coronary Angiography;  Surgeon: Peter M Martinique, MD;  Location: [facility];  Service: Cardiovascular;  Laterality:  N/A;   cataract surgery Right    COLONOSCOPY     CORONARY ARTERY BYPASS GRAFT N/A 03/18/2015   Procedure: CORONARY ARTERY BYPASS GRAFTING (CABG) x  three, using left internal mammary artery and right leg greater saphenous vein harvested endoscopically;  Surgeon: Melrose Nakayama, MD;  Location: [facility];  Service: Open Heart Surgery;  Laterality: N/A;   INGUINAL HERNIA REPAIR Bilateral    INTRAOPERATIVE TRANSESOPHAGEAL ECHOCARDIOGRAM N/A 03/18/2015   Procedure: INTRAOPERATIVE TRANSESOPHAGEAL ECHOCARDIOGRAM;  Surgeon: Melrose Nakayama, MD;  Location: [facility];  Service: Open Heart Surgery;  Laterality: N/A;   open heart surgery  [id number]   [place]    Family History  Problem Relation Age of Onset   Bone cancer Mother    Diabetes Paternal Aunt    Stroke Son    Down syndrome Son    Colon cancer Neg Hx    Esophageal cancer Neg Hx    Rectal cancer Neg Hx    Stomach cancer Neg Hx     Social History   Socioeconomic History   Marital status: Married    Spouse name: [name]   Number of children: 3   Years of education: Not on file   Highest education level: Not on file  Occupational History  Not on file  Tobacco Use   Smoking status: Former    Packs/day: 1.50    Years: 20.00    Pack years: 30.00    Types: Cigarettes    Quit date: 08/24/1986    Years since quitting: 34.2   Smokeless tobacco: Never  Vaping Use   Vaping Use: Never used  Substance and Sexual Activity   Alcohol use: Not Currently    Comment: occ   Drug use: No   Sexual activity: Not on file  Other Topics Concern   Not on file  Social History Narrative   Not on file   Social Determinants of Health   Financial Resource Strain: Low Risk    Difficulty of Paying Living Expenses: Not hard at all  Food Insecurity: No Food Insecurity   Worried About Charity fundraiser in the Last Year: Never true   [employer] in the Last Year: Never true  Transportation Needs: No Transportation Needs    Lack of Transportation (Medical): No   Lack of Transportation (Non-Medical): No  Physical Activity: Inactive   Days of Exercise per Week: 0 days   Minutes of Exercise per Session: 0 min  Stress: No Stress Concern Present   Feeling of Stress : Not at all  Social Connections: Socially Integrated   Frequency of Communication with Friends and Family: More than three times a week   Frequency of Social Gatherings with Friends and Family: More than three times a week   Attends Religious Services: More than 4 times per year   Active Member of [employer] or Organizations: Yes   Attends Music therapist: More than 4 times per year   Marital Status: Married  Human resources officer Violence: Not At Risk   Fear of Current or Ex-Partner: No   Emotionally Abused: No   Physically Abused: No   Sexually Abused: No    Outpatient Medications Prior to Visit  Medication Sig Dispense Refill   aspirin EC 81 MG tablet Take 1 tablet (81 mg total) by mouth daily. 90 tablet 3   atorvastatin (LIPITOR) 80 MG tablet TAKE 1 TABLET (80 MG TOTAL) BY MOUTH DAILY AT 6 PM. 90 tablet 2   carvedilol (COREG) 6.25 MG tablet TAKE 1 TABLET BY MOUTH TWICE A DAY 180 tablet 3   COVID-19 mRNA Vac-TriS, Pfizer, (PFIZER-BIONT COVID-19 VAC-TRIS) SUSP injection Inject into the muscle. 0.3 mL 0   donepezil (ARICEPT) 10 MG tablet TAKE 1 TABLET BY MOUTH EVERYDAY AT BEDTIME 90 tablet 3   EPINEPHrine 0.3 mg/0.3 mL IJ SOAJ injection Inject 0.3 mLs (0.3 mg total) into the muscle as needed for anaphylaxis. 2 each 3   escitalopram (LEXAPRO) 10 MG tablet TAKE 1 TABLET BY MOUTH EVERY DAY 90 tablet 0   ferrous sulfate 324 MG TBEC Take 324 mg by mouth.     glucose blood (FREESTYLE LITE) test strip Check Blood Sugars 1-2 times per day. DX: E11.9. 100 each 5   Lancets (FREESTYLE) lancets Check Blood Sugars 1-2 times per day. DX: E11.9 100 each 5   levothyroxine (SYNTHROID) 75 MCG tablet TAKE 1 TABLET BY MOUTH EVERY DAY 90 tablet 0   metFORMIN  (GLUCOPHAGE) 1000 MG tablet TAKE 1 TABLET BY MOUTH TWICE A DAY 180 tablet 3   Multiple Vitamin (MULTIVITAMIN) tablet Take 1 tablet by mouth daily. Centrium Silver once daily     vitamin B-12 (CYANOCOBALAMIN) 1000 MCG tablet Take 1,000 mcg by mouth daily.     vitamin C (ASCORBIC  ACID) 500 MG tablet Take 500 mg by mouth every evening.      losartan (COZAAR) 50 MG tablet Take 1 tablet (50 mg total) by mouth daily. (Patient taking differently: Take 25 mg by mouth daily. Take one half tablet by mouth once daily) 90 tablet 3   No facility-administered medications prior to visit.    Allergies  Allergen Reactions   Food Anaphylaxis    TREE NUTS   Shellfish Allergy Anaphylaxis   Prednisone     "psychosis"   Tramadol Other (See Comments)    hallucinations   Venomil Honey Bee Venom [Honey Bee Venom] Swelling    Localized swelling    ROS Review of Systems  Constitutional:  Negative for fatigue, fever and unexpected weight change.  HENT:  Negative for hearing loss.   Eyes:  Negative for visual disturbance.  Respiratory:  Negative for cough, chest tightness and shortness of breath.   Cardiovascular:  Negative for chest pain, palpitations and leg swelling.  Neurological:  Positive for dizziness. Negative for syncope, weakness, light-headedness and headaches.     Objective:    Physical Exam Vitals reviewed.  Constitutional:      Appearance: Normal appearance.  Cardiovascular:     Rate and Rhythm: Normal rate and regular rhythm.     Heart sounds: Murmur heard.  Pulmonary:     Effort: Pulmonary effort is normal.     Breath sounds: Normal breath sounds.  Neurological:     General: No focal deficit present.     Mental Status: He is alert and oriented to person, place, and time.     Cranial Nerves: No cranial nerve deficit.     Motor: No weakness.     Coordination: Coordination normal.    BP 132/66 (BP Location: Left Arm, Patient Position: Sitting, Cuff Size: Normal)   Pulse 69    Temp 98 F (36.7 C) (Oral)   Wt 152 lb 4.8 oz (69.1 kg)   SpO2 97%   BMI 23.16 kg/m  Wt Readings from Last 3 Encounters:  11/18/20 152 lb 4.8 oz (69.1 kg)  10/28/20 155 lb 4.8 oz (70.4 kg)  08/26/20 159 lb (72.1 kg)     Health Maintenance Due  Topic Date Due   Hepatitis C Screening  Never done   Zoster Vaccines- Shingrix (1 of 2) Never done   OPHTHALMOLOGY EXAM  07/13/2015   FOOT EXAM  09/12/2018   INFLUENZA VACCINE  10/31/2020    There are no preventive care reminders to display for this patient.  Lab Results  Component Value Date   TSH 1.88 12/16/2019   Lab Results  Component Value Date   WBC 9.2 05/27/2020   HGB 14.2 05/27/2020   HCT 42.5 05/27/2020   MCV 99.1 05/27/2020   PLT 176 05/27/2020   Lab Results  Component Value Date   NA 138 05/27/2020   K 4.3 05/27/2020   CO2 25 05/27/2020   GLUCOSE 170 (H) 05/27/2020   BUN 20 05/27/2020   CREATININE 1.01 05/27/2020   BILITOT 0.6 05/27/2020   ALKPHOS 67 05/27/2020   AST 23 05/27/2020   ALT 22 05/27/2020   PROT 7.1 05/27/2020   ALBUMIN 4.0 05/27/2020   CALCIUM 9.9 05/27/2020   ANIONGAP 12 05/27/2020   GFR 69.66 05/23/2020   Lab Results  Component Value Date   CHOL 125 12/16/2019   Lab Results  Component Value Date   HDL 45 12/16/2019   Lab Results  Component Value Date   LDLCALC 60  12/16/2019   Lab Results  Component Value Date   TRIG 117 12/16/2019   Lab Results  Component Value Date   CHOLHDL 2.8 12/16/2019   Lab Results  Component Value Date   HGBA1C 6.3 (A) 08/22/2020      Assessment & Plan:   #1 recent intermittent vertigo symptoms.  Overall improved.  Did have some very mild symptoms this morning.  Nonfocal exam  -Recommend observation for now. -If symptoms recurring by next week consider vestibular rehab.  #2 hypertension stable with recent reduction in losartan to 25 mg daily. -Continue to monitor blood pressure closely  #3 type 2 diabetes.  Last A1c 6.3%.  Set up 67-month follow-up and recheck labs at that point including A1c. -Continue current dose of metformin  Meds ordered this encounter  Medications   losartan (COZAAR) 25 MG tablet    Sig: Take 1 tablet (25 mg total) by mouth daily.    Dispense:  90 tablet    Refill:  3    Follow-up: Return in about 3 months (around 02/18/2021).    BCarolann Littler MD

## 2020-11-18 NOTE — Patient Instructions (Signed)
Let me know if vertigo symptoms not clearing by next week

## 2020-11-21 DIAGNOSIS — R2689 Other abnormalities of gait and mobility: Secondary | ICD-10-CM | POA: Diagnosis not present

## 2020-11-24 DIAGNOSIS — R2689 Other abnormalities of gait and mobility: Secondary | ICD-10-CM | POA: Diagnosis not present

## 2020-11-28 DIAGNOSIS — R2689 Other abnormalities of gait and mobility: Secondary | ICD-10-CM | POA: Diagnosis not present

## 2020-12-07 ENCOUNTER — Ambulatory Visit: Payer: PPO

## 2020-12-07 DIAGNOSIS — R2689 Other abnormalities of gait and mobility: Secondary | ICD-10-CM | POA: Diagnosis not present

## 2020-12-12 ENCOUNTER — Encounter: Payer: Self-pay | Admitting: Internal Medicine

## 2020-12-12 ENCOUNTER — Ambulatory Visit: Payer: PPO | Admitting: Internal Medicine

## 2020-12-12 VITALS — BP 140/70 | HR 68 | Ht 64.5 in | Wt 159.0 lb

## 2020-12-12 DIAGNOSIS — K862 Cyst of pancreas: Secondary | ICD-10-CM | POA: Diagnosis not present

## 2020-12-12 DIAGNOSIS — R2689 Other abnormalities of gait and mobility: Secondary | ICD-10-CM | POA: Diagnosis not present

## 2020-12-12 NOTE — Patient Instructions (Signed)
You will be contacted by [facility] in the next 2 days to arrange a MRI abdomin MRCP.  The number on your caller ID will be [phone], please answer when they call.  If you have not heard from them in 2 days please call [phone] to schedule.  Set this up for the first week of December.   I appreciate the opportunity to care for you. Silvano Rusk, MD, [facility]

## 2020-12-12 NOTE — Progress Notes (Signed)
Miguel Vazquez 80 y.o. 12-29-1940 [id number]  Assessment & Plan:   Encounter Diagnosis  Name Primary?   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  Follow-up after that.  I explained how pancreatic surgery would be a last resort and may not even be an option for him.  I think 1 more data point is reasonable there is considerable anxiety over the possibility of this becoming cancer on their part so we will do so.  CC: Eulas Post, MD    Subjective:   Chief Complaint: Follow-up pancreatic lesion  HPI 80 year old white man with a history of memory disturbance and a cystic lesion in the head of the pancreas being followed by imaging.  He is here with his wife.  He has no complaints today.  Last MRI was Aug 25, 2020, after CT scanning had shown a cystic lesion.   1. Redemonstrated multiloculated cystic lesion of the central pancreatic head measuring 3.8 x 1.8 x 1.8 cm. 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 No solid mass or pancreatic ductal dilatation. Recommend follow-up MRI at 6 months to ensure stability. Given size and recent growth, EUS/FNA can be considered. This recommendation follows ACR consensus guidelines: Management of Incidental Pancreatic Cysts: A White Paper of the ACR Incidental Findings Committee. J Am Coll Radiol [id number]. 2. Cortical scarring of the inferior pole of the right kidney, in keeping with prior obstructive or infectious insult.   The lesion had not grown since the most previous cross-sectional imaging but had grown previously which led to the recommendation of follow-up and consideration for EUS.  I did speak to the reading radiologist Dr. Laqueta Carina to clarify this since there had not been interval growth between the 2 most recent imaging  tests.  The previous MRI was in October 2021 and then April 2021 and originally a CT angio abdomen pelvis in March 2021.   Social history notable for that they are considering downsizing from 4 level house in [place] but struggling with the idea of selling their house and their belongings. Allergies  Allergen Reactions   Food Anaphylaxis    TREE NUTS   Shellfish Allergy Anaphylaxis   Prednisone     "psychosis"   Tramadol Other (See Comments)    hallucinations   Venomil Honey Bee Venom [Honey Bee Venom] Swelling    Localized swelling   Current Meds  Medication Sig   aspirin EC 81 MG tablet Take 1 tablet (81 mg total) by mouth daily.   atorvastatin (LIPITOR) 80 MG tablet TAKE 1 TABLET (80 MG TOTAL) BY MOUTH DAILY AT 6 PM.   carvedilol (COREG) 6.25 MG tablet TAKE 1 TABLET BY MOUTH TWICE A DAY   donepezil (ARICEPT) 10 MG tablet TAKE 1 TABLET BY MOUTH EVERYDAY AT BEDTIME   escitalopram (LEXAPRO) 10 MG tablet TAKE 1 TABLET BY MOUTH EVERY DAY   ferrous sulfate 324 MG TBEC Take 324 mg by mouth.   glucose blood (FREESTYLE LITE) test strip Check Blood Sugars 1-2 times per day. DX: E11.9.   Lancets (FREESTYLE) lancets Check Blood Sugars 1-2 times per day. DX: E11.9   levothyroxine (SYNTHROID) 75 MCG tablet TAKE 1 TABLET BY MOUTH EVERY DAY   losartan (COZAAR) 50 MG tablet Take 50 mg by mouth daily.   metFORMIN (  GLUCOPHAGE) 1000 MG tablet TAKE 1 TABLET BY MOUTH TWICE A DAY   Multiple Vitamin (MULTIVITAMIN) tablet Take 1 tablet by mouth daily. Centrium Silver once daily   vitamin B-12 (CYANOCOBALAMIN) 1000 MCG tablet Take 1,000 mcg by mouth daily.   vitamin C (ASCORBIC ACID) 500 MG tablet Take 500 mg by mouth every evening.    [DISCONTINUED] losartan (COZAAR) 25 MG tablet Take 1 tablet (25 mg total) by mouth daily.   Past Medical History:  Diagnosis Date   Acute blood loss anemia    Arthritis    Brain aneurysm 06/30/16   followed by Dr [name]   Cataract    Chronic kidney disease    kidney  stone- 40 years ago   Coronary artery disease    Cyst of pancreas 07/23/2019   Diabetes mellitus    Diverticulitis    GERD (gastroesophageal reflux disease)    Hx of abdominal aortic aneurysm 1998   Hyperlipidemia    Hypertension    Lower GI bleed    Myocardial infarction ([place])    07-01-2014   Personal history of colonic polyps - adenomas 09/09/2013   Past Surgical History:  Procedure Laterality Date   ABDOMINAL AORTIC ANEURYSM REPAIR  1998   CARDIAC CATHETERIZATION N/A 03/18/2015   Procedure: Left Heart Cath and Coronary Angiography;  Surgeon: Peter M Martinique, MD;  Location: [facility];  Service: Cardiovascular;  Laterality: N/A;   CATARACT EXTRACTION Right    COLONOSCOPY     CORONARY ARTERY BYPASS GRAFT N/A 03/18/2015   Procedure: CORONARY ARTERY BYPASS GRAFTING (CABG) x  three, using left internal mammary artery and right leg greater saphenous vein harvested endoscopically;  Surgeon: Melrose Nakayama, MD;  Location: [facility];  Service: Open Heart Surgery;  Laterality: N/A;   INGUINAL HERNIA REPAIR Bilateral    INTRAOPERATIVE TRANSESOPHAGEAL ECHOCARDIOGRAM N/A 03/18/2015   Procedure: INTRAOPERATIVE TRANSESOPHAGEAL ECHOCARDIOGRAM;  Surgeon: Melrose Nakayama, MD;  Location: [facility];  Service: Open Heart Surgery;  Laterality: N/A;   UMBILICAL HERNIA REPAIR  1965   Social History   Social History Narrative   Married and lives with wife in 62 story home.  Retired.      Adult son with Down syndrome died in July 01, 2018.  2 other children living.   family history includes Bone cancer in his mother; Diabetes in his paternal aunt; Down syndrome in his son; Stroke in his son.   Review of Systems As per HPI  Objective:   Physical Exam BP 140/70 (BP Location: Left Arm, Patient Position: Sitting, Cuff Size: Normal)   Pulse 68   Ht 5' 4.5" (1.638 m) Comment: height measured without shoes  Wt 159 lb (72.1 kg)   BMI 26.87 kg/m   Total time spent on this visit before (chart review and  preparation), during the patient's time in the office and afterward 21 minutes

## 2020-12-13 ENCOUNTER — Ambulatory Visit: Payer: PPO

## 2020-12-20 ENCOUNTER — Ambulatory Visit (INDEPENDENT_AMBULATORY_CARE_PROVIDER_SITE_OTHER): Payer: PPO

## 2020-12-20 ENCOUNTER — Other Ambulatory Visit: Payer: Self-pay

## 2020-12-20 VITALS — BP 112/62 | HR 72 | Temp 98.3°F | Wt 157.9 lb

## 2020-12-20 DIAGNOSIS — Z0000 Encounter for general adult medical examination without abnormal findings: Secondary | ICD-10-CM | POA: Diagnosis not present

## 2020-12-20 NOTE — Progress Notes (Addendum)
Subjective:   Miguel Vazquez is a 80 y.o. male who presents for Medicare Annual/Subsequent preventive examination.  Review of Systems     Cardiac Risk Factors include: advanced age (>80men, >65 women);hypertension;dyslipidemia;male gender;diabetes mellitus     Objective:    Today's Vitals   12/20/20 1503 12/20/20 1508  BP:  112/62  Pulse:  72  Temp:  98.3 F (36.8 C)  SpO2:  95%  Weight:  157 lb 14.4 oz (71.6 kg)  PainSc: 2     Body mass index is 26.68 kg/m.  Advanced Directives 12/20/2020 11/25/2019 06/24/2019 02/16/2019 09/11/2017 03/06/2017 02/07/2017  Does Patient Have a Medical Advance Directive? Yes Yes Yes Yes Yes Yes Yes  Type of Paramedic of [place];Living will [place];Living will [place];Living will - Healthcare Power of [place];Living will  Does patient want to make changes to medical advance directive? - No - Patient declined - - - - -  Copy of False Pass in Chart? Yes - validated most recent copy scanned in chart (See row information) Yes - validated most recent copy scanned in chart (See row information) - No - copy requested - - -  Would patient like information on creating a medical advance directive? - - - - - - -    Current Medications (verified) Outpatient Encounter Medications as of 12/20/2020  Medication Sig   aspirin EC 81 MG tablet Take 1 tablet (81 mg total) by mouth daily.   atorvastatin (LIPITOR) 80 MG tablet TAKE 1 TABLET (80 MG TOTAL) BY MOUTH DAILY AT 6 PM.   carvedilol (COREG) 6.25 MG tablet TAKE 1 TABLET BY MOUTH TWICE A DAY   donepezil (ARICEPT) 10 MG tablet TAKE 1 TABLET BY MOUTH EVERYDAY AT BEDTIME   EPINEPHrine 0.3 mg/0.3 mL IJ SOAJ injection Inject 0.3 mLs (0.3 mg total) into the muscle as needed for anaphylaxis.   escitalopram (LEXAPRO) 10 MG tablet TAKE 1 TABLET BY MOUTH EVERY DAY   ferrous sulfate  324 MG TBEC Take 324 mg by mouth.   glucose blood (FREESTYLE LITE) test strip Check Blood Sugars 1-2 times per day. DX: E11.9.   Lancets (FREESTYLE) lancets Check Blood Sugars 1-2 times per day. DX: E11.9   levothyroxine (SYNTHROID) 75 MCG tablet TAKE 1 TABLET BY MOUTH EVERY DAY   losartan (COZAAR) 50 MG tablet Take 50 mg by mouth daily.   metFORMIN (GLUCOPHAGE) 1000 MG tablet TAKE 1 TABLET BY MOUTH TWICE A DAY   Multiple Vitamin (MULTIVITAMIN) tablet Take 1 tablet by mouth daily. Centrium Silver once daily   vitamin B-12 (CYANOCOBALAMIN) 1000 MCG tablet Take 1,000 mcg by mouth daily.   vitamin C (ASCORBIC ACID) 500 MG tablet Take 500 mg by mouth every evening.    No facility-administered encounter medications on file as of 12/20/2020.    Allergies (verified) Food, Shellfish allergy, Prednisone, Tramadol, and Venomil honey bee venom [honey bee venom]   History: 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   Diabetes mellitus    Diverticulitis    GERD (gastroesophageal reflux disease)    Hx of abdominal aortic aneurysm 1998   Hyperlipidemia  Hypertension    Lower GI bleed    Myocardial infarction [facility])    2014/07/10   Personal history of colonic polyps - adenomas 09/09/2013   Past Surgical History:  Procedure Laterality Date   ABDOMINAL AORTIC ANEURYSM REPAIR  1998   CARDIAC CATHETERIZATION N/A 03/18/2015   Procedure: Left Heart Cath and Coronary Angiography;  Surgeon: Peter M Martinique, MD;  Location: [facility];  Service: Cardiovascular;  Laterality: N/A;   CATARACT EXTRACTION Right    COLONOSCOPY     CORONARY ARTERY BYPASS GRAFT N/A 03/18/2015   Procedure: CORONARY ARTERY BYPASS GRAFTING (CABG) x  three, using left internal mammary artery and right leg greater saphenous vein  harvested endoscopically;  Surgeon: Melrose Nakayama, MD;  Location: [facility];  Service: Open Heart Surgery;  Laterality: N/A;   INGUINAL HERNIA REPAIR Bilateral    INTRAOPERATIVE TRANSESOPHAGEAL ECHOCARDIOGRAM N/A 03/18/2015   Procedure: INTRAOPERATIVE TRANSESOPHAGEAL ECHOCARDIOGRAM;  Surgeon: Melrose Nakayama, MD;  Location: [facility];  Service: Open Heart Surgery;  Laterality: N/A;   UMBILICAL HERNIA REPAIR  1965   Family History  Problem Relation Age of Onset   Bone cancer Mother    Diabetes Paternal Aunt    Stroke Son    Down syndrome Son    Colon cancer Neg Hx    Esophageal cancer Neg Hx    Rectal cancer Neg Hx    Stomach cancer Neg Hx    Social History   Socioeconomic History   Marital status: Married    Spouse name: [name]   Number of children: 3   Years of education: Not on file   Highest education level: Not on file  Occupational History   Not on file  Tobacco Use   Smoking status: Former    Packs/day: 1.50    Years: 20.00    Pack years: 30.00    Types: Cigarettes    Quit date: 08/24/1986    Years since quitting: 34.3   Smokeless tobacco: Never  Vaping Use   Vaping Use: Never used  Substance and Sexual Activity   Alcohol use: Not Currently    Comment: occ   Drug use: No   Sexual activity: Not on file  Other Topics Concern   Not on file  Social History Narrative   Married and lives with wife in 107 story home.  Retired.      Adult son with Down syndrome died in 2018-07-10.  2 other children living.   Social Determinants of Health   Financial Resource Strain: Low Risk    Difficulty of Paying Living Expenses: Not hard at all  Food Insecurity: No Food Insecurity   Worried About Charity fundraiser in the Last Year: Never true   [place] in the Last Year: Never true  Transportation Needs: No Transportation Needs   Lack of Transportation (Medical): No   Lack of Transportation (Non-Medical): No  Physical Activity: Inactive   Days of Exercise per Week: 0  days   Minutes of Exercise per Session: 0 min  Stress: No Stress Concern Present   Feeling of Stress : Not at all  Social Connections: Moderately Integrated   Frequency of Communication with Friends and Family: More than three times a week   Frequency of Social Gatherings with Friends and Family: More than three times a week   Attends Religious Services: More than 4 times per year   Active Member of [employer] or Organizations: No   Attends Archivist Meetings:  Never   Marital Status: Married    Tobacco Counseling Counseling given: Not Answered   Clinical Intake:  Pre-visit preparation completed: Yes  Pain : 0-10 Pain Score: 2  Pain Type: Acute pain Pain Location: Buttocks Pain Descriptors / Indicators: Sore Pain Onset: 1 to 4 weeks ago Pain Frequency: Constant     BMI - recorded: 26.68 Nutritional Status: BMI 25 -29 Overweight Nutritional Risks: None Diabetes: Yes CBG done?: No Did pt. bring in CBG monitor from home?: No  How often do you need to have someone help you when you read instructions, pamphlets, or other written materials from your doctor or pharmacy?: 1 - Never  Diabetic?Nutrition Risk Assessment:  Has the patient had any N/V/D within the last 2 months?  No  Does the patient have any non-healing wounds?  No  Has the patient had any unintentional weight loss or weight gain?  No   Diabetes:  Is the patient diabetic?  Yes  If diabetic, was a CBG obtained today?  No  Did the patient bring in their glucometer from home?  No  How often do you monitor your CBG's? N/a.   Financial Strains and Diabetes Management:  Are you having any financial strains with the device, your supplies or your medication? No .  Does the patient want to be seen by Chronic Care Management for management of their diabetes?  No  Would the patient like to be referred to a Nutritionist or for Diabetic Management?  No   Diabetic Exams:  Diabetic Eye Exam: Overdue for  diabetic eye exam. Pt has been advised about the importance in completing this exam. Patient advised to call and schedule an eye exam. Diabetic Foot Exam: Overdue, Pt has been advised about the importance in completing this exam. Pt is scheduled for diabetic foot exam on next office visit.   Interpreter Needed?: No  Information entered by :: Charlott Rakes, LPN   Activities of Daily Living In your present state of health, do you have any difficulty performing the following activities: 12/20/2020  Hearing? Y  Comment HOH  Vision? N  Difficulty concentrating or making decisions? Y  Comment at times  Walking or climbing stairs? Y  Comment at times it can be hard  Dressing or bathing? N  Doing errands, shopping? N  Preparing Food and eating ? N  Using the Toilet? N  In the past six months, have you accidently leaked urine? Y  Comment at times  Do you have problems with loss of bowel control? N  Managing your Medications? N  Managing your Finances? N  Housekeeping or managing your Housekeeping? N  Some recent data might be hidden    Patient Care Team: Eulas Post, MD as PCP - General Martinique, Peter M, MD as PCP - Cardiology (Cardiology) Michael Boston, MD as Consulting Physician (General Surgery) Gatha Mayer, MD as Consulting Physician (Gastroenterology) Martinique, Peter M, MD as Consulting Physician (Cardiology) Melrose Nakayama, MD as Consulting Physician (Cardiothoracic Surgery) Earnie Larsson, [facility] as Pharmacist (Pharmacist)  Indicate any recent Medical Services you may have received from other than [facility] providers in the past year (date may be approximate).     Assessment:   This is a routine wellness examination for [place].  Hearing/Vision screen Hearing Screening - Comments:: Stated hearing loss Vision Screening - Comments:: [facility]   Dietary issues and exercise activities discussed: Current Exercise Habits:  The patient does not participate in  regular exercise at present   Goals Addressed             This Visit's Progress    Patient Stated       Start walking at the park       Depression Screen [facility] 2/9 Scores 12/20/2020 02/22/2020 11/25/2019 09/15/2019 09/11/2017 12/12/2016 08/03/2015  PHQ - 2 Score 0 0 0 0 0 0 0  PHQ- 9 Score - - 0 - - - -    Fall Risk Fall Risk  12/20/2020 07/01/2020 02/22/2020 11/25/2019 06/15/2019  Falls in the past year? 1 1 1  0 1  Number falls in past yr: 1 0 1 0 0  Injury with Fall? 1 0 0 0 1  Comment buttock - bruised and sore - -  Risk for fall due to : Impaired mobility;Impaired balance/gait - - Medication side effect History of fall(s)  Follow up Falls prevention discussed - - Falls evaluation completed;Falls prevention discussed Falls evaluation completed    FALL RISK PREVENTION PERTAINING TO THE HOME:  Any stairs in or around the home? Yes  If so, are there any without handrails? No  Home free of loose throw rugs in walkways, pet beds, electrical cords, etc? Yes  Adequate lighting in your home to reduce risk of falls? Yes   ASSISTIVE DEVICES UTILIZED TO PREVENT FALLS:  Life alert? No  Use of a cane, walker or w/c? No  Grab bars in the bathroom? Yes  Shower chair or bench in shower? Yes  Elevated toilet seat or a handicapped toilet? Yes   TIMED UP AND GO:  Was the test performed? Yes .  Length of time to ambulate 10 feet: 10 sec.   Gait steady and fast with assistive device  Cognitive Function: MMSE - Mini Mental State Exam 07/01/2020 09/11/2017  Not completed: - (No Data)  Orientation to time 2 -  Orientation to Place 3 -  Registration 3 -  Attention/ Calculation 1 -  Recall 2 -  Language- name 2 objects 2 -  Language- repeat 1 -  Language- follow 3 step command 3 -  Language- read & follow direction 1 -  Write a sentence 1 -  Copy design 0 -  Total score 19 -     6CIT Screen 12/20/2020 11/25/2019  What Year? 0 points 0 points  What  month? 3 points 0 points  What time? 0 points 0 points  Count back from 20 0 points 2 points  Months in reverse 4 points 4 points  Repeat phrase 10 points 10 points  Total Score 17 16    Immunizations Immunization History  Administered Date(s) Administered   Fluad Quad(high Dose 65+) 12/25/2018, 12/25/2018   Influenza Split 01/15/2011   Influenza, High Dose Seasonal PF 12/12/2016, 02/06/2018   Influenza,inj,Quad PF,6+ Mos 11/18/2013, 01/07/2016, 12/16/2019   Influenza-Unspecified 02/06/2018   PFIZER Comirnaty(Gray Top)Covid-19 Tri-Sucrose Vaccine 09/05/2020   PFIZER(Purple Top)SARS-COV-2 Vaccination 05/31/2019, 06/30/2019, 01/21/2020   Pneumococcal Conjugate-13 05/21/2014   Pneumococcal Polysaccharide-23 11/18/2012   Tdap 02/16/2019   Zoster, Live 05/21/2014    TDAP status: Up to date  Flu Vaccine status: Due, Education has been provided regarding the importance of this vaccine. Advised may receive this vaccine at local pharmacy or Health Dept. Aware to provide a copy of the vaccination record if obtained from local pharmacy or Health Dept. Verbalized acceptance and understanding.  Pneumococcal vaccine status: Up to date  Covid-19 vaccine status: Completed vaccines  Qualifies for Shingles Vaccine? Yes   Zostavax  completed Yes   Shingrix Completed?: No.    Education has been provided regarding the importance of this vaccine. Patient has been advised to call insurance company to determine out of pocket expense if they have not yet received this vaccine. Advised may also receive vaccine at local pharmacy or Health Dept. Verbalized acceptance and understanding.  Screening Tests Health Maintenance  Topic Date Due   Hepatitis C Screening  Never done   Zoster Vaccines- Shingrix (1 of 2) Never done   OPHTHALMOLOGY EXAM  07/13/2015   FOOT EXAM  09/12/2018   INFLUENZA VACCINE  10/31/2020   HEMOGLOBIN A1C  02/22/2021   TETANUS/TDAP  02/15/2029   COVID-19 Vaccine  Completed   HPV  VACCINES  Aged Out    Health Maintenance  Health Maintenance Due  Topic Date Due   Hepatitis C Screening  Never done   Zoster Vaccines- Shingrix (1 of 2) Never done   OPHTHALMOLOGY EXAM  07/13/2015   FOOT EXAM  09/12/2018   INFLUENZA VACCINE  10/31/2020    Colorectal cancer screening: No longer required.    Additional Screening:  Hepatitis C Screening: does qualify;   Vision Screening: Recommended annual ophthalmology exams for early detection of glaucoma and other disorders of the eye. Is the patient up to date with their annual eye exam?  Yes  Who is the provider or what is the name of the office in which the patient attends annual eye exams? [facility] If pt is not established with a provider, would they like to be referred to a provider to establish care? No .   Dental Screening: Recommended annual dental exams for proper oral hygiene  Community Resource Referral / Chronic Care Management: CRR required this visit?  No   CCM required this visit?  No      Plan:     I have personally reviewed and noted the following in the patient's chart:   Medical and social history Use of alcohol, tobacco or illicit drugs  Current medications and supplements including opioid prescriptions. Patient is not currently taking opioid prescriptions. Functional ability and status Nutritional status Physical activity Advanced directives List of other physicians Hospitalizations, surgeries, and ER visits in previous 12 months Vitals Screenings to include cognitive, depression, and falls Referrals and appointments  In addition, I have reviewed and discussed with patient certain preventive protocols, quality metrics, and best practice recommendations. A written personalized care plan for preventive services as well as general preventive health recommendations were provided to patient.     Willette Brace, LPN   11/10/9145   Nurse Notes: pt complains of pain in buttock from a  fall a few weeks ago. Also having issues with hearing that he wants to address pt will make appt

## 2020-12-20 NOTE — Patient Instructions (Signed)
[name] , Thank you for taking time to come for your Medicare Wellness Visit. I appreciate your ongoing commitment to your health goals. Please review the following plan we discussed and let me know if I can assist you in the future.   Screening recommendations/referrals: Colonoscopy: No longer required Recommended yearly ophthalmology/optometry visit for glaucoma screening and checkup Recommended yearly dental visit for hygiene and checkup  Vaccinations: Influenza vaccine: Due and discussed Pneumococcal vaccine: Completed  Tdap vaccine: Done 02/16/19 repeat every 10 years due 02/15/29 Shingles vaccine: Shingrix discussed. Please contact your pharmacy for coverage information.    Covid-19: Completed 2/28,3/30, 01/21/20 & 09/05/20  Advanced directives: copies in chart  Conditions/risks identified: Walk in the park more often  Next appointment: Follow up in one year for your annual wellness visit.  Preventive Care 79 Years and Older, Male Preventive care refers to lifestyle choices and visits with your health care provider that can promote health and wellness. What does preventive care include? A yearly physical exam. This is also called an annual well check. Dental exams once or twice a year. Routine eye exams. Ask your health care provider how often you should have your eyes checked. Personal lifestyle choices, including: Daily care of your teeth and gums. Regular physical activity. Eating a healthy diet. Avoiding tobacco and drug use. Limiting alcohol use. Practicing safe sex. Taking low doses of aspirin every day. Taking vitamin and mineral supplements as recommended by your health care provider. What happens during an annual well check? The services and screenings done by your health care provider during your annual well check will depend on your age, overall health, lifestyle risk factors, and family history of disease. Counseling  Your health care provider may ask you  questions about your: Alcohol use. Tobacco use. Drug use. Emotional well-being. Home and relationship well-being. Sexual activity. Eating habits. History of falls. Memory and ability to understand (cognition). Work and work Statistician. Screening  You may have the following tests or measurements: Height, weight, and BMI. Blood pressure. Lipid and cholesterol levels. These may be checked every 5 years, or more frequently if you are over 79 years old. Skin check. Lung cancer screening. You may have this screening every year starting at age 79 if you have a 30-pack-year history of smoking and currently smoke or have quit within the past 15 years. Fecal occult blood test (FOBT) of the stool. You may have this test every year starting at age 79. Flexible sigmoidoscopy or colonoscopy. You may have a sigmoidoscopy every 5 years or a colonoscopy every 10 years starting at age 79. Prostate cancer screening. Recommendations will vary depending on your family history and other risks. Hepatitis C blood test. Hepatitis B blood test. Sexually transmitted disease (STD) testing. Diabetes screening. This is done by checking your blood sugar (glucose) after you have not eaten for a while (fasting). You may have this done every 1-3 years. Abdominal aortic aneurysm (AAA) screening. You may need this if you are a current or former smoker. Osteoporosis. You may be screened starting at age 79 if you are at high risk. Talk with your health care provider about your test results, treatment options, and if necessary, the need for more tests. Vaccines  Your health care provider may recommend certain vaccines, such as: Influenza vaccine. This is recommended every year. Tetanus, diphtheria, and acellular pertussis (Tdap, Td) vaccine. You may need a Td booster every 10 years. Zoster vaccine. You may need this after age 79. Pneumococcal 13-valent conjugate (PCV13)  vaccine. One dose is recommended after age  79. Pneumococcal polysaccharide (PPSV23) vaccine. One dose is recommended after age 79. Talk to your health care provider about which screenings and vaccines you need and how often you need them. This information is not intended to replace advice given to you by your health care provider. Make sure you discuss any questions you have with your health care provider. Document Released: 04/15/2015 Document Revised: 12/07/2015 Document Reviewed: 01/18/2015 Elsevier Interactive Patient Education  2017 [place] Prevention in the Home Falls can cause injuries. They can happen to people of all ages. There are many things you can do to make your home safe and to help prevent falls. What can I do on the outside of my home? Regularly fix the edges of walkways and driveways and fix any cracks. Remove anything that might make you trip as you walk through a door, such as a raised step or threshold. Trim any bushes or trees on the path to your home. Use bright outdoor lighting. Clear any walking paths of anything that might make someone trip, such as rocks or tools. Regularly check to see if handrails are loose or broken. Make sure that both sides of any steps have handrails. Any raised decks and porches should have guardrails on the edges. Have any leaves, snow, or ice cleared regularly. Use sand or salt on walking paths during winter. Clean up any spills in your garage right away. This includes oil or grease spills. What can I do in the bathroom? Use night lights. Install grab bars by the toilet and in the tub and shower. Do not use towel bars as grab bars. Use non-skid mats or decals in the tub or shower. If you need to sit down in the shower, use a plastic, non-slip stool. Keep the floor dry. Clean up any water that spills on the floor as soon as it happens. Remove soap buildup in the tub or shower regularly. Attach bath mats securely with double-sided non-slip rug tape. Do not have throw  rugs and other things on the floor that can make you trip. What can I do in the bedroom? Use night lights. Make sure that you have a light by your bed that is easy to reach. Do not use any sheets or blankets that are too big for your bed. They should not hang down onto the floor. Have a firm chair that has side arms. You can use this for support while you get dressed. Do not have throw rugs and other things on the floor that can make you trip. What can I do in the kitchen? Clean up any spills right away. Avoid walking on wet floors. Keep items that you use a lot in easy-to-reach places. If you need to reach something above you, use a strong step stool that has a grab bar. Keep electrical cords out of the way. Do not use floor polish or wax that makes floors slippery. If you must use wax, use non-skid floor wax. Do not have throw rugs and other things on the floor that can make you trip. What can I do with my stairs? Do not leave any items on the stairs. Make sure that there are handrails on both sides of the stairs and use them. Fix handrails that are broken or loose. Make sure that handrails are as long as the stairways. Check any carpeting to make sure that it is firmly attached to the stairs. Fix any carpet that is loose  or worn. Avoid having throw rugs at the top or bottom of the stairs. If you do have throw rugs, attach them to the floor with carpet tape. Make sure that you have a light switch at the top of the stairs and the bottom of the stairs. If you do not have them, ask someone to add them for you. What else can I do to help prevent falls? Wear shoes that: Do not have high heels. Have rubber bottoms. Are comfortable and fit you well. Are closed at the toe. Do not wear sandals. If you use a stepladder: Make sure that it is fully opened. Do not climb a closed stepladder. Make sure that both sides of the stepladder are locked into place. Ask someone to hold it for you, if  possible. Clearly mark and make sure that you can see: Any grab bars or handrails. First and last steps. Where the edge of each step is. Use tools that help you move around (mobility aids) if they are needed. These include: Canes. Walkers. Scooters. Crutches. Turn on the lights when you go into a dark area. Replace any light bulbs as soon as they burn out. Set up your furniture so you have a clear path. Avoid moving your furniture around. If any of your floors are uneven, fix them. If there are any pets around you, be aware of where they are. Review your medicines with your doctor. Some medicines can make you feel dizzy. This can increase your chance of falling. Ask your doctor what other things that you can do to help prevent falls. This information is not intended to replace advice given to you by your health care provider. Make sure you discuss any questions you have with your health care provider. Document Released: 01/13/2009 Document Revised: 08/25/2015 Document Reviewed: 04/23/2014 Elsevier Interactive Patient Education  2017 [employer].

## 2020-12-21 DIAGNOSIS — R2689 Other abnormalities of gait and mobility: Secondary | ICD-10-CM | POA: Diagnosis not present

## 2020-12-31 ENCOUNTER — Other Ambulatory Visit: Payer: Self-pay | Admitting: Family Medicine

## 2021-01-03 ENCOUNTER — Ambulatory Visit (HOSPITAL_BASED_OUTPATIENT_CLINIC_OR_DEPARTMENT_OTHER)
Admission: RE | Admit: 2021-01-03 | Discharge: 2021-01-03 | Disposition: A | Discharge status: Home / Self Care | Payer: PPO | Source: Ambulatory Visit | Attending: Family Medicine | Admitting: Family Medicine

## 2021-01-03 ENCOUNTER — Other Ambulatory Visit: Payer: Self-pay

## 2021-01-03 ENCOUNTER — Ambulatory Visit (INDEPENDENT_AMBULATORY_CARE_PROVIDER_SITE_OTHER): Payer: PPO | Admitting: Family Medicine

## 2021-01-03 VITALS — BP 130/64 | HR 65 | Temp 97.9°F | Wt 157.0 lb

## 2021-01-03 DIAGNOSIS — E039 Hypothyroidism, unspecified: Secondary | ICD-10-CM

## 2021-01-03 DIAGNOSIS — M25551 Pain in right hip: Secondary | ICD-10-CM | POA: Insufficient documentation

## 2021-01-03 DIAGNOSIS — E1121 Type 2 diabetes mellitus with diabetic nephropathy: Secondary | ICD-10-CM

## 2021-01-03 DIAGNOSIS — E7800 Pure hypercholesterolemia, unspecified: Secondary | ICD-10-CM | POA: Diagnosis not present

## 2021-01-03 DIAGNOSIS — I10 Essential (primary) hypertension: Secondary | ICD-10-CM

## 2021-01-03 LAB — HEPATIC FUNCTION PANEL
ALT: 19 U/L (ref 0–53)
AST: 20 U/L (ref 0–37)
Albumin: 4.2 g/dL (ref 3.5–5.2)
Alkaline Phosphatase: 103 U/L (ref 39–117)
Bilirubin, Direct: 0.1 mg/dL (ref 0.0–0.3)
Total Bilirubin: 0.6 mg/dL (ref 0.2–1.2)
Total Protein: 7 g/dL (ref 6.0–8.3)

## 2021-01-03 LAB — POCT GLYCOSYLATED HEMOGLOBIN (HGB A1C): Hemoglobin A1C: 6 % — AB (ref 4.0–5.6)

## 2021-01-03 LAB — LIPID PANEL
Cholesterol: 126 mg/dL (ref 0–200)
HDL: 45.9 mg/dL (ref >39.00)
LDL Cholesterol: 52 mg/dL (ref 0–99)
NonHDL: 79.97
Total CHOL/HDL Ratio: 3
Triglycerides: 141 mg/dL (ref 0.0–149.0)
VLDL: 28.2 mg/dL (ref 0.0–40.0)

## 2021-01-03 LAB — BASIC METABOLIC PANEL
BUN: 23 mg/dL (ref 6–23)
CO2: 31 mEq/L (ref 19–32)
Calcium: 10.1 mg/dL (ref 8.4–10.5)
Chloride: 101 mEq/L (ref 96–112)
Creatinine, Ser: 1.15 mg/dL (ref 0.40–1.50)
GFR: 60.77 mL/min (ref >60.00)
Glucose, Bld: 79 mg/dL (ref 70–99)
Potassium: 5 mEq/L (ref 3.5–5.1)
Sodium: 140 mEq/L (ref 135–145)

## 2021-01-03 IMAGING — DX DG HIP (WITH OR WITHOUT PELVIS) 2-3V*R*
3 series · 3 of 3 positions shown · non-contrast
Comparison: Right hip CT [DATE].  Right hip x-ray [DATE].

CLINICAL DATA: Fell 2 months ago.  Right hip pain.

EXAM:
DG HIP (WITH OR WITHOUT PELVIS) 2-3V RIGHT

[pelvis ap]
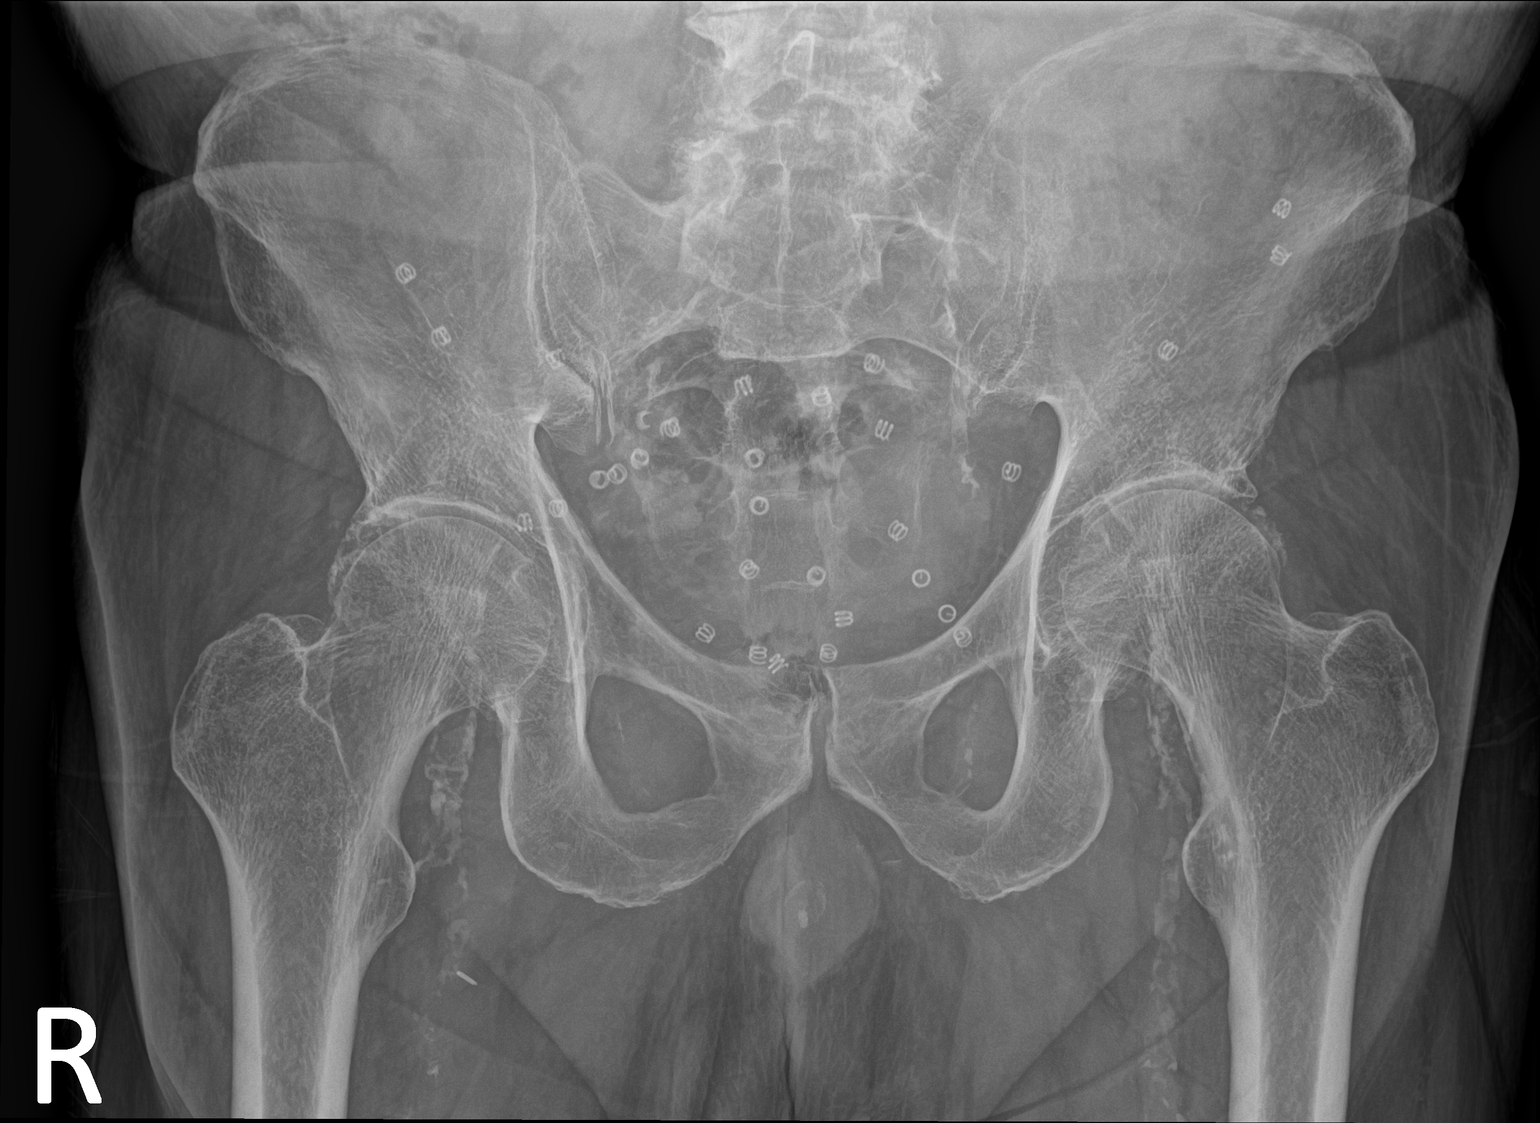

[hip ap]
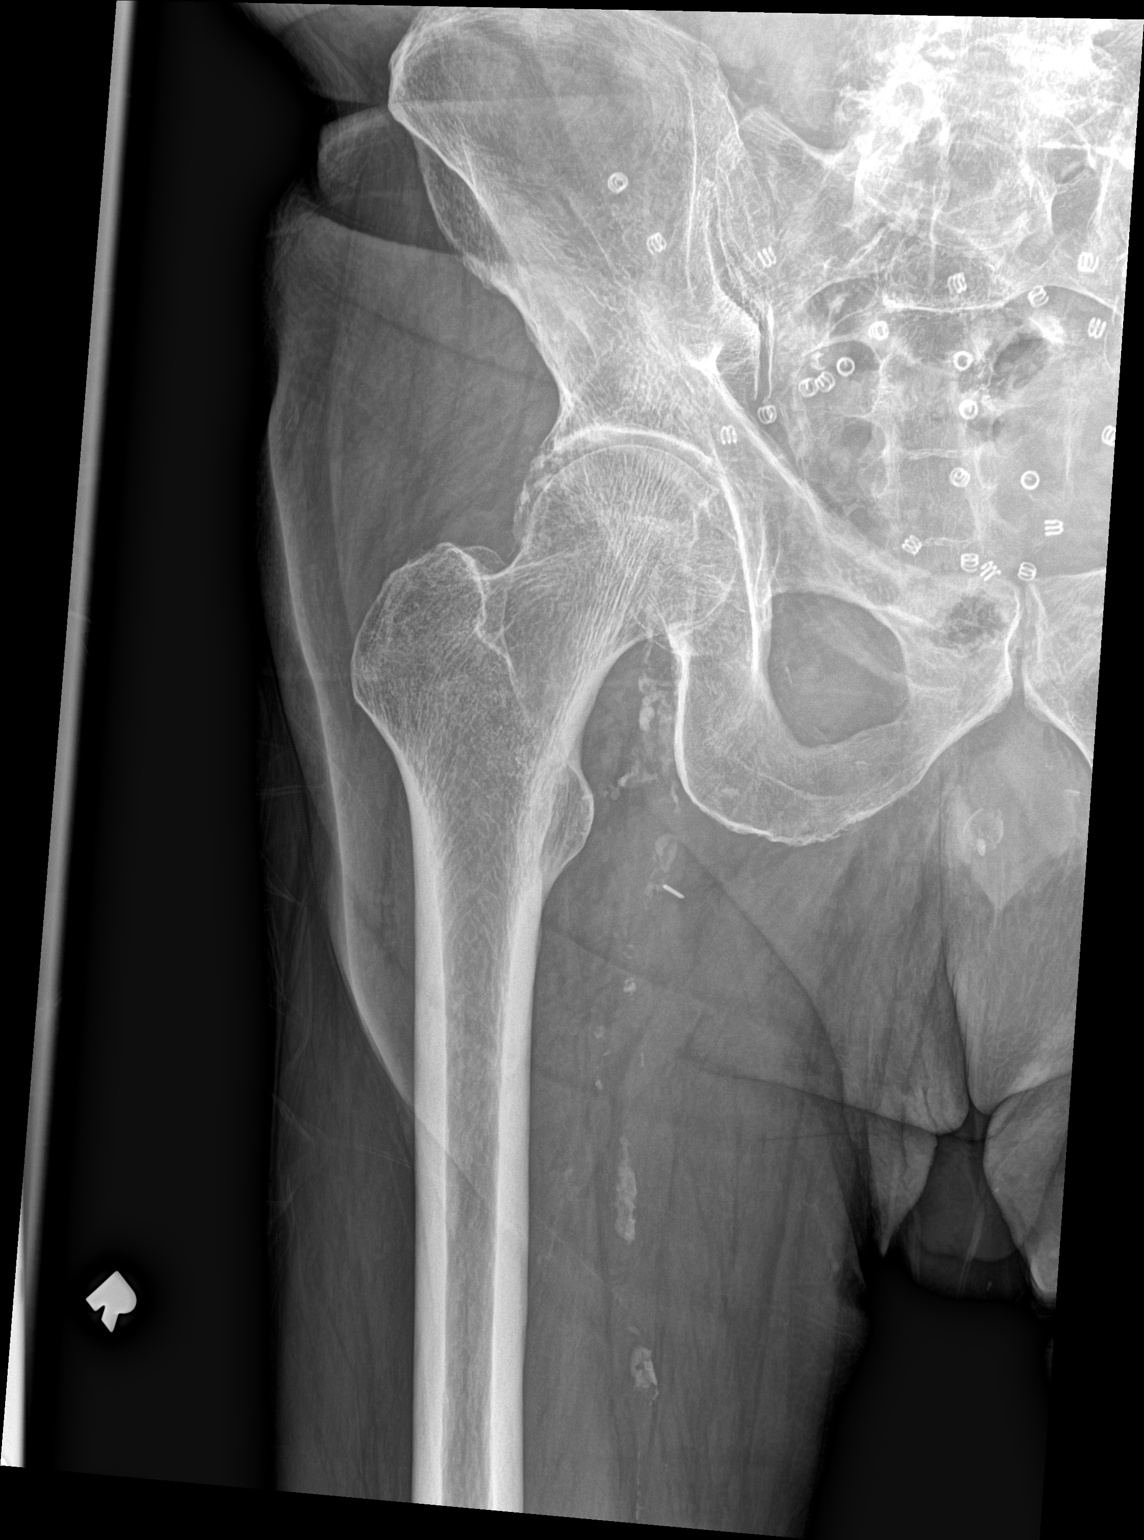

[hip lat]
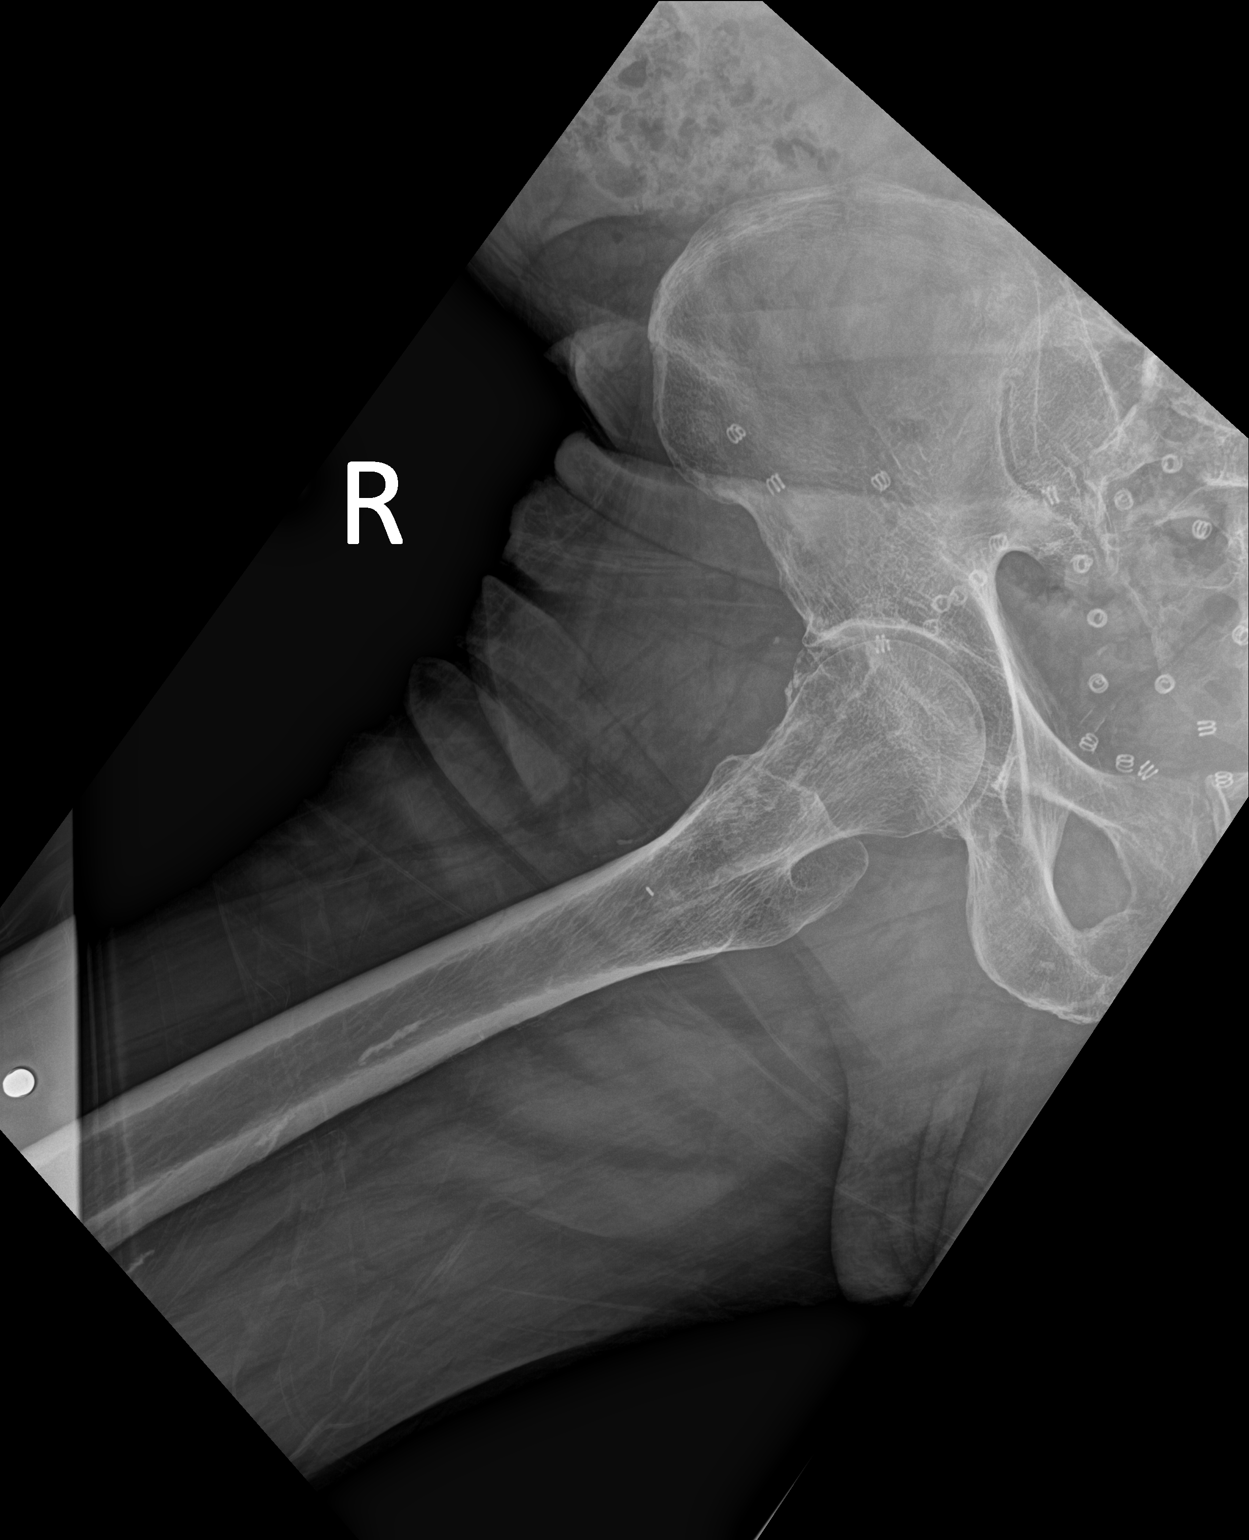

[3 of 3 positions shown; findings below may reference images not displayed]

FINDINGS: The bones are osteopenic. There is no fracture or dislocation. There
are mild degenerative changes of both hips with joint space
narrowing and osteophyte formation similar to the prior study.
Surgical coils overlie the pelvis. There are vascular calcifications
in the soft tissues.
IMPRESSION: 1. No acute bony abnormality.

## 2021-01-03 NOTE — Progress Notes (Signed)
Established Patient Office Visit  Subjective:  Patient ID: Miguel Vazquez, male    DOB: 12/26/1940  Age: 80 y.o. MRN: [id number]  CC:  Chief Complaint  Patient presents with   Follow-up    diabetes    HPI [name] presents for several items as follows  About 2 months ago he states he was stepping over something and tripped and fell basically on his buttocks.  He has had some pain right posterior hip region since then.  Pain is relatively mild worse with going downstairs and occasionally with walking.  Pain symptoms are somewhat inconsistent.  Denies any low back pain currently.  He actually seeing physical therapist [facility] in [facility] for some strengthening and balance exercises.  Type 2 diabetes.  This has been well controlled in the past.  Due for repeat A1c at this time.  He also needs follow-up lipids.  He takes high-dose atorvastatin as well as aspirin, carvedilol, Aricept, Lexapro, levothyroxine, losartan, metformin.  No recent chest pains.  Compliant with medications.  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   Diabetes mellitus    Diverticulitis    GERD (gastroesophageal reflux disease)    Hx of abdominal aortic aneurysm 1998   Hyperlipidemia    Hypertension    Lower GI bleed    Myocardial infarction ([place])    2016   Personal history of colonic polyps - adenomas 09/09/2013    Past Surgical History:  Procedure Laterality Date   ABDOMINAL AORTIC ANEURYSM REPAIR  1998   CARDIAC CATHETERIZATION N/A 03/18/2015   Procedure: Left Heart Cath and Coronary Angiography;  Surgeon: Peter M Martinique, MD;  Location: [facility];  Service: Cardiovascular;  Laterality: N/A;   CATARACT EXTRACTION Right    COLONOSCOPY     CORONARY ARTERY BYPASS GRAFT N/A 03/18/2015    Procedure: CORONARY ARTERY BYPASS GRAFTING (CABG) x  three, using left internal mammary artery and right leg greater saphenous vein harvested endoscopically;  Surgeon: Melrose Nakayama, MD;  Location: [facility];  Service: Open Heart Surgery;  Laterality: N/A;   INGUINAL HERNIA REPAIR Bilateral    INTRAOPERATIVE TRANSESOPHAGEAL ECHOCARDIOGRAM N/A 03/18/2015   Procedure: INTRAOPERATIVE TRANSESOPHAGEAL ECHOCARDIOGRAM;  Surgeon: Melrose Nakayama, MD;  Location: [facility];  Service: Open Heart Surgery;  Laterality: N/A;   UMBILICAL HERNIA REPAIR  1965    Family History  Problem Relation Age of Onset   Bone cancer Mother    Diabetes Paternal Aunt    Stroke Son    Down syndrome Son    Colon cancer Neg Hx    Esophageal cancer Neg Hx    Rectal cancer Neg Hx    Stomach cancer Neg Hx     Social History   Socioeconomic History   Marital status: Married    Spouse name: [name]   Number of children: 3   Years of education: Not on file   Highest education level: Not on file  Occupational History   Not on file  Tobacco Use   Smoking status: Former    Packs/day: 1.50    Years: 20.00    Pack years: 30.00    Types: Cigarettes    Quit date: 08/24/1986  Years since quitting: 34.3   Smokeless tobacco: Never  Vaping Use   Vaping Use: Never used  Substance and Sexual Activity   Alcohol use: Not Currently    Comment: occ   Drug use: No   Sexual activity: Not on file  Other Topics Concern   Not on file  Social History Narrative   Married and lives with wife in 59 story home.  Retired.      Adult son with Down syndrome died in July 16, 2018.  2 other children living.   Social Determinants of Health   Financial Resource Strain: Low Risk    Difficulty of Paying Living Expenses: Not hard at all  Food Insecurity: No Food Insecurity   Worried About Charity fundraiser in the Last Year: Never true   [place] in the Last Year: Never true  Transportation Needs: No Transportation Needs   Lack of  Transportation (Medical): No   Lack of Transportation (Non-Medical): No  Physical Activity: Inactive   Days of Exercise per Week: 0 days   Minutes of Exercise per Session: 0 min  Stress: No Stress Concern Present   Feeling of Stress : Not at all  Social Connections: Moderately Integrated   Frequency of Communication with Friends and Family: More than three times a week   Frequency of Social Gatherings with Friends and Family: More than three times a week   Attends Religious Services: More than 4 times per year   Active Member of [employer] or Organizations: No   Attends Archivist Meetings: Never   Marital Status: Married  Human resources officer Violence: Not At Risk   Fear of Current or Ex-Partner: No   Emotionally Abused: No   Physically Abused: No   Sexually Abused: No    Outpatient Medications Prior to Visit  Medication Sig Dispense Refill   aspirin EC 81 MG tablet Take 1 tablet (81 mg total) by mouth daily. 90 tablet 3   atorvastatin (LIPITOR) 80 MG tablet TAKE 1 TABLET (80 MG TOTAL) BY MOUTH DAILY AT 6 PM. 90 tablet 2   carvedilol (COREG) 6.25 MG tablet TAKE 1 TABLET BY MOUTH TWICE A DAY 180 tablet 3   donepezil (ARICEPT) 10 MG tablet TAKE 1 TABLET BY MOUTH EVERYDAY AT BEDTIME 90 tablet 3   EPINEPHrine 0.3 mg/0.3 mL IJ SOAJ injection Inject 0.3 mLs (0.3 mg total) into the muscle as needed for anaphylaxis. 2 each 3   escitalopram (LEXAPRO) 10 MG tablet TAKE 1 TABLET BY MOUTH EVERY DAY 90 tablet 0   ferrous sulfate 324 MG TBEC Take 324 mg by mouth.     glucose blood (FREESTYLE LITE) test strip Check Blood Sugars 1-2 times per day. DX: E11.9. 100 each 5   Lancets (FREESTYLE) lancets Check Blood Sugars 1-2 times per day. DX: E11.9 100 each 5   levothyroxine (SYNTHROID) 75 MCG tablet TAKE 1 TABLET BY MOUTH EVERY DAY 90 tablet 0   losartan (COZAAR) 50 MG tablet Take 50 mg by mouth daily.     metFORMIN (GLUCOPHAGE) 1000 MG tablet TAKE 1 TABLET BY MOUTH TWICE A DAY 180 tablet 3    Multiple Vitamin (MULTIVITAMIN) tablet Take 1 tablet by mouth daily. Centrium Silver once daily     vitamin B-12 (CYANOCOBALAMIN) 1000 MCG tablet Take 1,000 mcg by mouth daily.     vitamin C (ASCORBIC ACID) 500 MG tablet Take 500 mg by mouth every evening.      No facility-administered medications prior to visit.  Allergies  Allergen Reactions   Food Anaphylaxis    TREE NUTS   Shellfish Allergy Anaphylaxis   Prednisone     "psychosis"   Tramadol Other (See Comments)    hallucinations   Venomil Honey Bee Venom [Honey Bee Venom] Swelling    Localized swelling    ROS Review of Systems  Constitutional:  Negative for fatigue.  Eyes:  Negative for visual disturbance.  Respiratory:  Negative for cough, chest tightness and shortness of breath.   Cardiovascular:  Negative for chest pain, palpitations and leg swelling.  Neurological:  Negative for dizziness, syncope, weakness, light-headedness and headaches.     Objective:    Physical Exam Constitutional:      Appearance: He is well-developed.  HENT:     Right Ear: External ear normal.     Left Ear: External ear normal.  Eyes:     Pupils: Pupils are equal, round, and reactive to light.  Neck:     Thyroid: No thyromegaly.  Cardiovascular:     Rate and Rhythm: Normal rate and regular rhythm.  Pulmonary:     Effort: Pulmonary effort is normal. No respiratory distress.     Breath sounds: Normal breath sounds. No wheezing or rales.  Musculoskeletal:     Cervical back: Neck supple.     Comments: No lumbar spinal tenderness.  No sacral tenderness.  Mild tenderness around the right sacroiliac joint.  Right hip reveals full range of motion.  No lateral tenderness.  Straight leg raise negative.  Neurological:     Mental Status: He is alert and oriented to person, place, and time.     Comments: Good strength with plantarflexion and dorsiflexion bilaterally.    BP 130/64 (BP Location: Left Arm, Patient Position: Sitting, Cuff Size:  Normal)   Pulse 65   Temp 97.9 F (36.6 C) (Oral)   Wt 157 lb (71.2 kg)   SpO2 94%   BMI 26.53 kg/m  Wt Readings from Last 3 Encounters:  01/03/21 157 lb (71.2 kg)  12/20/20 157 lb 14.4 oz (71.6 kg)  12/12/20 159 lb (72.1 kg)     Health Maintenance Due  Topic Date Due   Hepatitis C Screening  Never done   Zoster Vaccines- Shingrix (1 of 2) Never done   OPHTHALMOLOGY EXAM  07/13/2015   FOOT EXAM  09/12/2018   INFLUENZA VACCINE  10/31/2020    There are no preventive care reminders to display for this patient.  Lab Results  Component Value Date   TSH 1.88 12/16/2019   Lab Results  Component Value Date   WBC 9.2 05/27/2020   HGB 14.2 05/27/2020   HCT 42.5 05/27/2020   MCV 99.1 05/27/2020   PLT 176 05/27/2020   Lab Results  Component Value Date   NA 138 05/27/2020   K 4.3 05/27/2020   CO2 25 05/27/2020   GLUCOSE 170 (H) 05/27/2020   BUN 20 05/27/2020   CREATININE 1.01 05/27/2020   BILITOT 0.6 05/27/2020   ALKPHOS 67 05/27/2020   AST 23 05/27/2020   ALT 22 05/27/2020   PROT 7.1 05/27/2020   ALBUMIN 4.0 05/27/2020   CALCIUM 9.9 05/27/2020   ANIONGAP 12 05/27/2020   GFR 69.66 05/23/2020   Lab Results  Component Value Date   CHOL 125 12/16/2019   Lab Results  Component Value Date   HDL 45 12/16/2019   Lab Results  Component Value Date   LDLCALC 60 12/16/2019   Lab Results  Component Value Date   TRIG  117 12/16/2019   Lab Results  Component Value Date   CHOLHDL 2.8 12/16/2019   Lab Results  Component Value Date   HGBA1C 6.0 (A) 01/03/2021      Assessment & Plan:   #1 somewhat poorly localized right hip pain following fall couple months ago.  Ambulating without much difficulty though wife states he seems he is walking more slowly than usual. -Obtain x-rays right hip and pelvis to further evaluate  #2 hypothyroidism -Recheck TSH  #3 hyperlipidemia.  Goal LDL less than 70. -Recheck lipid and hepatic panel  #4 type 2 diabetes well  controlled with A1c today 6.0% -Continue current dose of metformin.  Continue yearly eye exam.  Reassess in 6 months.   No orders of the defined types were placed in this encounter.   Follow-up: Return in about 6 months (around 07/04/2021).    Carolann Littler, MD

## 2021-01-04 LAB — TSH: TSH: 3.71 u[IU]/mL (ref 0.35–5.50)

## 2021-01-06 DIAGNOSIS — D2262 Melanocytic nevi of left upper limb, including shoulder: Secondary | ICD-10-CM | POA: Diagnosis not present

## 2021-01-06 DIAGNOSIS — L570 Actinic keratosis: Secondary | ICD-10-CM | POA: Diagnosis not present

## 2021-01-06 DIAGNOSIS — B351 Tinea unguium: Secondary | ICD-10-CM | POA: Diagnosis not present

## 2021-01-06 DIAGNOSIS — L821 Other seborrheic keratosis: Secondary | ICD-10-CM | POA: Diagnosis not present

## 2021-01-11 ENCOUNTER — Encounter: Payer: Self-pay | Admitting: Neurology

## 2021-01-11 ENCOUNTER — Ambulatory Visit: Payer: PPO | Admitting: Neurology

## 2021-01-11 VITALS — BP 151/83 | HR 67 | Ht 66.0 in | Wt 156.0 lb

## 2021-01-11 DIAGNOSIS — R269 Unspecified abnormalities of gait and mobility: Secondary | ICD-10-CM | POA: Diagnosis not present

## 2021-01-11 DIAGNOSIS — R413 Other amnesia: Secondary | ICD-10-CM

## 2021-01-11 DIAGNOSIS — G629 Polyneuropathy, unspecified: Secondary | ICD-10-CM

## 2021-01-11 HISTORY — DX: Unspecified abnormalities of gait and mobility: R26.9

## 2021-01-11 NOTE — Progress Notes (Signed)
Reason for visit: Memory disturbance, gait disturbance  Miguel Vazquez is an 80 y.o. male  History of present illness:  Miguel Vazquez is a 80 year old right-handed white male with a history of a memory disorder, he is currently on Aricept and tolerates this well.  He has had some slowness of gait that has developed over the last year or so.  The patient does have a history of diabetes.  On the prior examination, he was noted to have sensory alteration in the feet consistent with a peripheral neuropathy.  The patient reports no burning or stinging in the feet.  He did fall recently, he landed on his buttocks, he did have an x-ray that did not show evidence of a fracture.  He has not had any other falls.  He denies issues controlling the bowels or the bladder.  He has not had any further episodes of confusion.  He returns to the office today for an evaluation.  He claims that he sleeps fairly well at night.  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   Diabetes mellitus    Diverticulitis    Gait abnormality 01/11/2021   GERD (gastroesophageal reflux disease)    Hx of abdominal aortic aneurysm 1998   Hyperlipidemia    Hypertension    Lower GI bleed    Myocardial infarction ([place])    2016   Personal history of colonic polyps - adenomas 09/09/2013    Past Surgical History:  Procedure Laterality Date   ABDOMINAL AORTIC ANEURYSM REPAIR  1998   CARDIAC CATHETERIZATION N/A 03/18/2015   Procedure: Left Heart Cath and Coronary Angiography;  Surgeon: Peter M Martinique, MD;  Location: [facility];  Service: Cardiovascular;  Laterality: N/A;   CATARACT EXTRACTION Right    COLONOSCOPY     CORONARY ARTERY BYPASS GRAFT N/A 03/18/2015   Procedure: CORONARY ARTERY BYPASS GRAFTING (CABG) x  three,  using left internal mammary artery and right leg greater saphenous vein harvested endoscopically;  Surgeon: Melrose Nakayama, MD;  Location: [facility];  Service: Open Heart Surgery;  Laterality: N/A;   INGUINAL HERNIA REPAIR Bilateral    INTRAOPERATIVE TRANSESOPHAGEAL ECHOCARDIOGRAM N/A 03/18/2015   Procedure: INTRAOPERATIVE TRANSESOPHAGEAL ECHOCARDIOGRAM;  Surgeon: Melrose Nakayama, MD;  Location: [facility];  Service: Open Heart Surgery;  Laterality: N/A;   UMBILICAL HERNIA REPAIR  1965    Family History  Problem Relation Age of Onset   Bone cancer Mother    Diabetes Paternal Aunt    Stroke Son    Down syndrome Son    Colon cancer Neg Hx    Esophageal cancer Neg Hx    Rectal cancer Neg Hx    Stomach cancer Neg Hx     Social history:  reports that he quit smoking about 34 years ago. His smoking use included cigarettes. He has a 30.00 pack-year smoking history. He has never used smokeless tobacco. He reports that he does not currently use alcohol. He reports that he does not use drugs.    Allergies  Allergen Reactions   Food Anaphylaxis    TREE NUTS   Shellfish Allergy Anaphylaxis   Prednisone     "psychosis"   Tramadol Other (See  Comments)    hallucinations   Venomil Honey Bee Venom [Honey Bee Venom] Swelling    Localized swelling    Medications:  Prior to Admission medications   Medication Sig Start Date End Date Taking? Authorizing Provider  aspirin EC 81 MG tablet Take 1 tablet (81 mg total) by mouth daily. 06/07/17   Martinique, Peter M, MD  atorvastatin (LIPITOR) 80 MG tablet TAKE 1 TABLET (80 MG TOTAL) BY MOUTH DAILY AT 6 PM. 06/03/20   [name], [name], MD  carvedilol (COREG) 6.25 MG tablet TAKE 1 TABLET BY MOUTH TWICE A DAY 10/05/20   Martinique, Peter M, MD  donepezil (ARICEPT) 10 MG tablet TAKE 1 TABLET BY MOUTH EVERYDAY AT BEDTIME 05/30/20   [name], [name], MD  EPINEPHrine 0.3 mg/0.3 mL IJ SOAJ injection Inject 0.3 mLs (0.3 mg total) into the muscle as needed for  anaphylaxis. 10/30/18   [name], [name], MD  escitalopram (LEXAPRO) 10 MG tablet TAKE 1 TABLET BY MOUTH EVERY DAY 01/02/21   [name], [name], MD  ferrous sulfate 324 MG TBEC Take 324 mg by mouth.    [provider]  glucose blood (FREESTYLE LITE) test strip Check Blood Sugars 1-2 times per day. DX: E11.9. 01/10/16   [name], [name], MD  Lancets (FREESTYLE) lancets Check Blood Sugars 1-2 times per day. DX: E11.9 01/10/16   [name], [name], MD  levothyroxine (SYNTHROID) 75 MCG tablet TAKE 1 TABLET BY MOUTH EVERY DAY 01/02/21   [name], [name], MD  losartan (COZAAR) 50 MG tablet Take 50 mg by mouth daily. 11/26/20   [provider]  metFORMIN (GLUCOPHAGE) 1000 MG tablet TAKE 1 TABLET BY MOUTH TWICE A DAY 09/06/20   [name], [name], MD  Multiple Vitamin (MULTIVITAMIN) tablet Take 1 tablet by mouth daily. Centrium Silver once daily    [provider]  vitamin B-12 (CYANOCOBALAMIN) 1000 MCG tablet Take 1,000 mcg by mouth daily.    [provider]  vitamin C (ASCORBIC ACID) 500 MG tablet Take 500 mg by mouth every evening.     [provider]    ROS:  Out of a complete 14 system review of symptoms, the patient complains only of the following symptoms, and all other reviewed systems are negative.  Gait slowness, gait instability Memory problems  Blood pressure (!) 151/83, pulse 67, height 5\' 6"  (1.676 m), weight 156 lb (70.8 kg).  Physical Exam  General: The patient is alert and cooperative at the time of the examination.  The patient is moderately obese.  Skin: No significant peripheral edema is noted.   Neurologic Exam  Mental status: The patient is alert and oriented x 3 at the time of the examination. The Mini-Mental status examination done today shows a total score 22/30.   Cranial nerves: Facial symmetry is present. Speech is normal, no aphasia or dysarthria is noted. Extraocular movements are full. Visual fields are  full.  Motor: The patient has good strength in all 4 extremities.  Sensory examination: Soft touch sensation is symmetric on the face, arms, and legs.  Coordination: The patient has good finger-nose-finger and heel-to-shin bilaterally.  Gait and station: The patient has the ability to stand with the arms crossed from a seated position.  Once up, he can walk independently but has a wide-based gait, good symmetric arm swing is seen.  Tandem gait is unsteady.  Romberg is negative but is unsteady.  Reflexes: Deep tendon reflexes are symmetric, but the ankle jerk reflexes are depressed bilaterally.  MRI brain 07/07/20:   IMPRESSION: Abnormal MRI scan of the brain without contrast showing moderate changes of generalized cerebral atrophy and small vessel disease.  No acute abnormalities noted.  * MRI scan images were reviewed online. I agree with the written report.    Assessment/Plan:  1.  Memory disturbance  2.  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  The patient will be set up for further blood work today, he will have nerve conduction studies on both legs and EMG on 1 leg.  The patient will follow-up in 6 months, in the future he can be followed through Dr. Leta Baptist.  We will follow the memory issues over time.  Jill Alexanders MD 01/11/2021 11:12 AM  [facility] [address] [place], [place] [address]  Phone [phone] Fax [phone]

## 2021-01-17 LAB — MULTIPLE MYELOMA PANEL, SERUM
Albumin SerPl Elph-Mcnc: 3.9 g/dL (ref 2.9–4.4)
Albumin/Glob SerPl: 1.4 (ref 0.7–1.7)
Alpha 1: 0.2 g/dL (ref 0.0–0.4)
Alpha2 Glob SerPl Elph-Mcnc: 0.7 g/dL (ref 0.4–1.0)
B-Globulin SerPl Elph-Mcnc: 0.9 g/dL (ref 0.7–1.3)
Gamma Glob SerPl Elph-Mcnc: 1 g/dL (ref 0.4–1.8)
Globulin, Total: 2.8 g/dL (ref 2.2–3.9)
IgA/Immunoglobulin A, Serum: 196 mg/dL (ref 61–437)
IgG (Immunoglobin G), Serum: 1045 mg/dL (ref 603–1613)
IgM (Immunoglobulin M), Srm: 52 mg/dL (ref 15–143)
Total Protein: 6.7 g/dL (ref 6.0–8.5)

## 2021-01-17 LAB — ENA+DNA/DS+SJORGEN'S
ENA RNP Ab: 3.8 AI — ABNORMAL HIGH (ref 0.0–0.9)
ENA SM Ab Ser-aCnc: <0.2 AI (ref 0.0–0.9)
ENA SSA (RO) Ab: <0.2 AI (ref 0.0–0.9)
ENA SSB (LA) Ab: <0.2 AI (ref 0.0–0.9)
dsDNA Ab: 1 IU/mL (ref 0–9)

## 2021-01-17 LAB — HEPATITIS C ANTIBODY: Hep C Virus Ab: <0.1 s/co ratio (ref 0.0–0.9)

## 2021-01-17 LAB — LYME DISEASE SEROLOGY W/REFLEX: Lyme Total Antibody EIA: NEGATIVE

## 2021-01-17 LAB — ANGIOTENSIN CONVERTING ENZYME: Angio Convert Enzyme: 33 U/L (ref 14–82)

## 2021-01-17 LAB — ANA W/REFLEX: Anti Nuclear Antibody (ANA): POSITIVE — AB

## 2021-01-17 LAB — SEDIMENTATION RATE: Sed Rate: 6 mm/hr (ref 0–30)

## 2021-02-20 ENCOUNTER — Ambulatory Visit (INDEPENDENT_AMBULATORY_CARE_PROVIDER_SITE_OTHER): Payer: PPO | Admitting: Family Medicine

## 2021-02-20 VITALS — BP 132/62 | HR 99 | Temp 97.4°F | Wt 157.4 lb

## 2021-02-20 DIAGNOSIS — I10 Essential (primary) hypertension: Secondary | ICD-10-CM | POA: Diagnosis not present

## 2021-02-20 DIAGNOSIS — Z23 Encounter for immunization: Secondary | ICD-10-CM

## 2021-02-20 DIAGNOSIS — E039 Hypothyroidism, unspecified: Secondary | ICD-10-CM

## 2021-02-20 DIAGNOSIS — E1121 Type 2 diabetes mellitus with diabetic nephropathy: Secondary | ICD-10-CM

## 2021-02-20 DIAGNOSIS — E7800 Pure hypercholesterolemia, unspecified: Secondary | ICD-10-CM | POA: Diagnosis not present

## 2021-02-20 NOTE — Progress Notes (Signed)
Established Patient Office Visit  Subjective:  Patient ID: Miguel Vazquez, male    DOB: May 19, 1940  Age: 80 y.o. MRN: [id number]  CC:  Chief Complaint  Patient presents with   Follow-up    HPI Miguel Vazquez presents for medical follow-up.  We actually had recommended a 37-month follow-up and is seen a month ago and apparently today had been previously scheduled and they decided to go and keep the appointment.  He does still need his flu vaccine.  Had a recent fall.  X-rays of the hip showed no acute bony abnormality.  Has had some general slowing and gait in the past couple years.  Suspected peripheral neuropathy per neurology.  He had multiple recent labs through neurology surgeon for various causes of neuropathy these were all basically unremarkable.  Diabetes has been well controlled with recent A1c 6.0%.  Recent thyroid at goal.  He has cardiac history but denies any recent chest pains.  His blood pressure remains well controlled.  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   Diabetes mellitus    Diverticulitis    Gait abnormality 01/11/2021   GERD (gastroesophageal reflux disease)    Hx of abdominal aortic aneurysm 1998   Hyperlipidemia    Hypertension    Lower GI bleed    Myocardial infarction ([place])    2016   Personal history of colonic polyps - adenomas 09/09/2013    Past Surgical History:  Procedure Laterality Date   ABDOMINAL AORTIC ANEURYSM REPAIR  1998   CARDIAC CATHETERIZATION N/A 03/18/2015   Procedure: Left Heart Cath and Coronary Angiography;  Surgeon: Peter M Martinique, MD;   Location: [facility];  Service: Cardiovascular;  Laterality: N/A;   CATARACT EXTRACTION Right    COLONOSCOPY     CORONARY ARTERY BYPASS GRAFT N/A 03/18/2015   Procedure: CORONARY ARTERY BYPASS GRAFTING (CABG) x  three, using left internal mammary artery and right leg greater saphenous vein harvested endoscopically;  Surgeon: Melrose Nakayama, MD;  Location: [facility];  Service: Open Heart Surgery;  Laterality: N/A;   INGUINAL HERNIA REPAIR Bilateral    INTRAOPERATIVE TRANSESOPHAGEAL ECHOCARDIOGRAM N/A 03/18/2015   Procedure: INTRAOPERATIVE TRANSESOPHAGEAL ECHOCARDIOGRAM;  Surgeon: Melrose Nakayama, MD;  Location: [facility];  Service: Open Heart Surgery;  Laterality: N/A;   UMBILICAL HERNIA REPAIR  1965    Family History  Problem Relation Age of Onset   Bone cancer Mother    Diabetes Paternal Aunt    Stroke Son    Down syndrome Son    Colon cancer Neg Hx    Esophageal cancer Neg Hx    Rectal cancer Neg Hx    Stomach cancer Neg Hx     Social History   Socioeconomic History   Marital status: Married    Spouse name: [name]   Number of children: 3   Years of education: Not on file   Highest education level: Not on file  Occupational History   Not on file  Tobacco Use   Smoking status: Former    Packs/day: 1.50  Years: 20.00    Pack years: 30.00    Types: Cigarettes    Quit date: 08/24/1986    Years since quitting: 34.5   Smokeless tobacco: Never  Vaping Use   Vaping Use: Never used  Substance and Sexual Activity   Alcohol use: Not Currently    Comment: occ   Drug use: No   Sexual activity: Not on file  Other Topics Concern   Not on file  Social History Narrative   Married and lives with wife in 68 story home.  Retired.      Adult son with Down syndrome died in 07-16-18.  2 other children living.   Social Determinants of Health   Financial Resource Strain: Low Risk    Difficulty of Paying Living Expenses: Not hard at all  Food Insecurity: No Food Insecurity    Worried About Charity fundraiser in the Last Year: Never true   [place] in the Last Year: Never true  Transportation Needs: No Transportation Needs   Lack of Transportation (Medical): No   Lack of Transportation (Non-Medical): No  Physical Activity: Inactive   Days of Exercise per Week: 0 days   Minutes of Exercise per Session: 0 min  Stress: No Stress Concern Present   Feeling of Stress : Not at all  Social Connections: Moderately Integrated   Frequency of Communication with Friends and Family: More than three times a week   Frequency of Social Gatherings with Friends and Family: More than three times a week   Attends Religious Services: More than 4 times per year   Active Member of [employer] or Organizations: No   Attends Archivist Meetings: Never   Marital Status: Married  Human resources officer Violence: Not At Risk   Fear of Current or Ex-Partner: No   Emotionally Abused: No   Physically Abused: No   Sexually Abused: No    Outpatient Medications Prior to Visit  Medication Sig Dispense Refill   aspirin EC 81 MG tablet Take 1 tablet (81 mg total) by mouth daily. 90 tablet 3   atorvastatin (LIPITOR) 80 MG tablet TAKE 1 TABLET (80 MG TOTAL) BY MOUTH DAILY AT 6 PM. 90 tablet 2   carvedilol (COREG) 6.25 MG tablet TAKE 1 TABLET BY MOUTH TWICE A DAY 180 tablet 3   donepezil (ARICEPT) 10 MG tablet TAKE 1 TABLET BY MOUTH EVERYDAY AT BEDTIME 90 tablet 3   EPINEPHrine 0.3 mg/0.3 mL IJ SOAJ injection Inject 0.3 mLs (0.3 mg total) into the muscle as needed for anaphylaxis. 2 each 3   escitalopram (LEXAPRO) 10 MG tablet TAKE 1 TABLET BY MOUTH EVERY DAY 90 tablet 0   ferrous sulfate 324 MG TBEC Take 324 mg by mouth.     glucose blood (FREESTYLE LITE) test strip Check Blood Sugars 1-2 times per day. DX: E11.9. 100 each 5   Lancets (FREESTYLE) lancets Check Blood Sugars 1-2 times per day. DX: E11.9 100 each 5   levothyroxine (SYNTHROID) 75 MCG tablet TAKE 1 TABLET BY MOUTH EVERY DAY  90 tablet 0   losartan (COZAAR) 50 MG tablet Take 50 mg by mouth daily.     metFORMIN (GLUCOPHAGE) 1000 MG tablet TAKE 1 TABLET BY MOUTH TWICE A DAY 180 tablet 3   Multiple Vitamin (MULTIVITAMIN) tablet Take 1 tablet by mouth daily. Centrium Silver once daily     vitamin B-12 (CYANOCOBALAMIN) 1000 MCG tablet Take 1,000 mcg by mouth daily.     vitamin C (ASCORBIC ACID)  500 MG tablet Take 500 mg by mouth every evening.      No facility-administered medications prior to visit.    Allergies  Allergen Reactions   Food Anaphylaxis    TREE NUTS   Shellfish Allergy Anaphylaxis   Prednisone     "psychosis"   Tramadol Other (See Comments)    hallucinations   Venomil Honey Bee Venom [Honey Bee Venom] Swelling    Localized swelling    ROS Review of Systems  Constitutional:  Negative for fatigue.  Eyes:  Negative for visual disturbance.  Respiratory:  Negative for cough, chest tightness and shortness of breath.   Cardiovascular:  Negative for chest pain, palpitations and leg swelling.  Endocrine: Negative for polydipsia and polyuria.  Neurological:  Negative for dizziness, syncope, weakness, light-headedness and headaches.     Objective:    Physical Exam Constitutional:      Appearance: He is well-developed.  HENT:     Right Ear: External ear normal.     Left Ear: External ear normal.  Eyes:     Pupils: Pupils are equal, round, and reactive to light.  Neck:     Thyroid: No thyromegaly.  Cardiovascular:     Rate and Rhythm: Normal rate and regular rhythm.  Pulmonary:     Effort: Pulmonary effort is normal. No respiratory distress.     Breath sounds: Normal breath sounds. No wheezing or rales.  Musculoskeletal:     Cervical back: Neck supple.     Right lower leg: No edema.     Left lower leg: No edema.  Neurological:     Mental Status: He is alert and oriented to person, place, and time.     Comments: Feet revealed normal monofilament testing at this time.  Romberg normal     BP 132/62 (BP Location: Left Arm, Patient Position: Sitting, Cuff Size: Normal)   Pulse 99   Temp (!) 97.4 F (36.3 C) (Oral)   Wt 157 lb 6.4 oz (71.4 kg)   SpO2 99%   BMI 25.41 kg/m  Wt Readings from Last 3 Encounters:  02/20/21 157 lb 6.4 oz (71.4 kg)  01/11/21 156 lb (70.8 kg)  01/03/21 157 lb (71.2 kg)     Health Maintenance Due  Topic Date Due   Zoster Vaccines- Shingrix (1 of 2) Never done   OPHTHALMOLOGY EXAM  07/13/2015   COVID-19 Vaccine (5 - Booster for Pfizer series) 10/31/2020    There are no preventive care reminders to display for this patient.  Lab Results  Component Value Date   TSH 3.71 01/03/2021   Lab Results  Component Value Date   WBC 9.2 05/27/2020   HGB 14.2 05/27/2020   HCT 42.5 05/27/2020   MCV 99.1 05/27/2020   PLT 176 05/27/2020   Lab Results  Component Value Date   NA 140 01/03/2021   K 5.0 01/03/2021   CO2 31 01/03/2021   GLUCOSE 79 01/03/2021   BUN 23 01/03/2021   CREATININE 1.15 01/03/2021   BILITOT 0.6 01/03/2021   ALKPHOS 103 01/03/2021   AST 20 01/03/2021   ALT 19 01/03/2021   PROT 6.7 01/11/2021   ALBUMIN 4.2 01/03/2021   CALCIUM 10.1 01/03/2021   ANIONGAP 12 05/27/2020   GFR 60.77 01/03/2021   Lab Results  Component Value Date   CHOL 126 01/03/2021   Lab Results  Component Value Date   HDL 45.90 01/03/2021   Lab Results  Component Value Date   LDLCALC 52 01/03/2021   Lab  Results  Component Value Date   TRIG 141.0 01/03/2021   Lab Results  Component Value Date   CHOLHDL 3 01/03/2021   Lab Results  Component Value Date   HGBA1C 6.0 (A) 01/03/2021      Assessment & Plan:   #1 type 2 diabetes well controlled with recent A1c 6.0%.  Recheck at 43-month follow-up.  Flu vaccine given.  Continue current diabetic medications.  Does appear that he is due for eye exam and he is encouraged to set this up soon.  #2 hypertension stable and well-controlled with losartan, carvedilol.  #3 hypothyroidism.   Recent TSH at goal.  Continue current dose of levothyroxine  #4 history of CAD.  Patient on high-dose atorvastatin.  Lipids checked a month ago and stable with LDL 52     No orders of the defined types were placed in this encounter.   Follow-up: No follow-ups on file.    Carolann Littler, MD

## 2021-02-22 ENCOUNTER — Other Ambulatory Visit: Payer: Self-pay | Admitting: Family Medicine

## 2021-02-22 DIAGNOSIS — E785 Hyperlipidemia, unspecified: Secondary | ICD-10-CM

## 2021-03-08 ENCOUNTER — Other Ambulatory Visit: Payer: Self-pay | Admitting: Internal Medicine

## 2021-03-08 ENCOUNTER — Ambulatory Visit (HOSPITAL_COMMUNITY)
Admission: RE | Admit: 2021-03-08 | Discharge: 2021-03-08 | Disposition: A | Discharge status: Home / Self Care | Payer: PPO | Source: Ambulatory Visit | Attending: Internal Medicine | Admitting: Internal Medicine

## 2021-03-08 ENCOUNTER — Inpatient Hospital Stay (HOSPITAL_COMMUNITY): Admission: RE | Admit: 2021-03-08 | Payer: PPO | Source: Ambulatory Visit

## 2021-03-08 DIAGNOSIS — K862 Cyst of pancreas: Secondary | ICD-10-CM | POA: Diagnosis not present

## 2021-03-08 DIAGNOSIS — N281 Cyst of kidney, acquired: Secondary | ICD-10-CM | POA: Diagnosis not present

## 2021-03-08 DIAGNOSIS — K5730 Diverticulosis of large intestine without perforation or abscess without bleeding: Secondary | ICD-10-CM | POA: Diagnosis not present

## 2021-03-08 DIAGNOSIS — K7689 Other specified diseases of liver: Secondary | ICD-10-CM | POA: Diagnosis not present

## 2021-03-08 MED ORDER — GADOBUTROL 1 MMOL/ML IV SOLN
7.0000 mL | Freq: Once | INTRAVENOUS | Status: AC | PRN
  Administered 2021-03-08: 7 mL via INTRAVENOUS

## 2021-03-09 ENCOUNTER — Ambulatory Visit (INDEPENDENT_AMBULATORY_CARE_PROVIDER_SITE_OTHER): Payer: PPO | Admitting: Diagnostic Neuroimaging

## 2021-03-09 ENCOUNTER — Encounter: Payer: PPO | Admitting: Diagnostic Neuroimaging

## 2021-03-09 DIAGNOSIS — G629 Polyneuropathy, unspecified: Secondary | ICD-10-CM

## 2021-03-09 DIAGNOSIS — Z0289 Encounter for other administrative examinations: Secondary | ICD-10-CM

## 2021-03-09 NOTE — Procedures (Signed)
[facility]  NCS (NERVE CONDUCTION STUDY) WITH EMG (ELECTROMYOGRAPHY) REPORT   STUDY DATE: 03/09/21 PATIENT NAME: Miguel Vazquez DOB: 03-15-1941 MRN: [id number]  ORDERING CLINICIAN: Kathrynn Ducking, MD   TECHNOLOGIST: Sherre Scarlet ELECTROMYOGRAPHER: Earlean Polka. Diasia Henken, MD  CLINICAL INFORMATION: 80 year old male with gait difficulty.  FINDINGS: NERVE CONDUCTION STUDY:  Right peroneal motor response is normal.  Left peroneal motor response could not be obtained.  Right tibial motor responses prolonged distal latency and decreased amplitude.  Left tibial motor responses normal.  Right sural and right superficial peroneal sensory responses could not be obtained.  Left sural sensory response has decreased amplitude.  Left superficial peroneal sensory response has decreased amplitude.  Bilateral tibial F wave latencies are normal.    NEEDLE ELECTROMYOGRAPHY:  Needle examination of right lower extremity is normal.    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   INTERPRETING PHYSICIAN:  Penni Bombard, MD Certified in Neurology, Neurophysiology and Neuroimaging  [facility] [address] [place], [place] [address] [phone]   [facility]    Nerve / Sites Muscle Latency Ref. Amplitude Ref. Rel Amp Segments Distance Velocity Ref. Area    ms ms mV mV %  cm m/s m/s mVms  R Peroneal - EDB     Ankle EDB 4.4 ?6.5 2.4 ?2.0 100 Ankle - EDB 9   10.4     Fib head EDB 10.8  2.1  89.3 Fib head - Ankle 28 44 ?44 12.3     Pop fossa EDB 13.1  2.2  106 Pop fossa - Fib head 10 44 ?44 12.9         Pop fossa - Ankle      L Peroneal - EDB     Ankle EDB NR ?6.5 NR ?2.0 NR Ankle - EDB 9   NR     Fib head EDB NR  NR  NR Fib head - Ankle 28 NR ?44 NR     Pop fossa EDB NR  NR  NR Pop fossa - Fib head 10 NR ?44 NR         Pop fossa - Ankle      R Tibial - AH     Ankle AH 6.4 ?5.8 3.3 ?4.0 100  Ankle - AH 9   9.4     Pop fossa AH 14.6  3.4  104 Pop fossa - Ankle 39 48 ?41 14.0  L Tibial - AH     Ankle AH 5.6 ?5.8 4.4 ?4.0 100 Ankle - AH 9   11.5     Pop fossa AH 15.0  3.5  79.5 Pop fossa - Ankle 40 43 ?41 10.6             SNC    Nerve / Sites Rec. Site Peak Lat Ref.  Amp Ref. Segments Distance    ms ms V V  cm  R Sural - Ankle (Calf)     Calf Ankle NR ?4.4 NR ?6 Calf - Ankle 14  L Sural - Ankle (Calf)     Calf Ankle 4.3 ?4.4 2 ?6 Calf - Ankle 14  R Superficial peroneal - Ankle     Lat leg Ankle NR ?4.4 NR ?6 Lat leg - Ankle 14  L Superficial peroneal - Ankle     Lat leg Ankle 4.0 ?4.4 2 ?6 Lat leg - Ankle 14  F  Wave    Nerve F Lat Ref.   ms ms  R Tibial - AH 54.0 ?56.0  L Tibial - AH 54.8 ?56.0         EMG Summary Table    Spontaneous MUAP Recruitment  Muscle IA Fib PSW Fasc Other Amp Dur. Poly Pattern  R. Vastus medialis Normal None None None _______ Normal Normal Normal Normal  R. Tibialis anterior Normal None None None _______ Normal Normal Normal Normal  R. Gastrocnemius (Medial head) Normal None None None _______ Normal Normal Normal Normal

## 2021-03-17 NOTE — Progress Notes (Signed)
03/21/2021 Miguel Vazquez   [birth year]  [id number]  Primary Physician Miguel Vazquez, Miguel Sierras, MD Primary Cardiologist: Dr Miguel Vazquez  HPI:  80 y/o male seen for follow up CAD. He has a history of DM, HTN, CRI, and PVD s/p remote AAA R&G, admitted 03/18/15 with a STEMI. At cath he had severe 3V and left main CAD. EF 20--25%. He went for urgent CABG. He tolerated this well. Post op he had PAF and was placed on Amiodarone. Amiodarone has since been discontinued. Echo in January 2017 showed EF 35-40%. He was placed on lisinopril but developed hyperkalemia so this was stopped. On his follow up we resumed at a lower dose and potassium levels have been normal.   He was admitted in March 2021 with lower GI bleed due to diverticulitis and diverticular bleed. Treated with antibiotics.   Was seen by Miguel Montana PA-C in May. Doing well at that time. Echocardiogram 07/08/2020 with hypokinesis of the inferobasal and dyskinesis of basal anterolateral wall, LVEF 40 to 45%, mild aortic stenosis. Evaluated by Neurology with gait disturbance. MRI showed cortical atrophy. EMG c/w axonal polyneuropathy.   On follow up today he is doing well from a cardiac standpoint. Denies any chest pain or SOB. Notes he doesn't have as much energy and gets tired out more easily. No edema or palpitations.    Current Outpatient Medications  Medication Sig Dispense Refill   aspirin EC 81 MG tablet Take 1 tablet (81 mg total) by mouth daily. 90 tablet 3   atorvastatin (LIPITOR) 80 MG tablet TAKE 1 TABLET BY MOUTH DAILY AT 6 PM. 90 tablet 2   carvedilol (COREG) 6.25 MG tablet TAKE 1 TABLET BY MOUTH TWICE A DAY 180 tablet 3   donepezil (ARICEPT) 10 MG tablet TAKE 1 TABLET BY MOUTH EVERYDAY AT BEDTIME 90 tablet 3   EPINEPHrine 0.3 mg/0.3 mL IJ SOAJ injection Inject 0.3 mLs (0.3 mg total) into the muscle as needed for anaphylaxis. 2 each 3   escitalopram (LEXAPRO) 10 MG tablet TAKE 1 TABLET BY MOUTH EVERY DAY 90 tablet 0   ferrous  sulfate 324 MG TBEC Take 324 mg by mouth.     glucose blood (FREESTYLE LITE) test strip Check Blood Sugars 1-2 times per day. DX: E11.9. 100 each 5   Lancets (FREESTYLE) lancets Check Blood Sugars 1-2 times per day. DX: E11.9 100 each 5   levothyroxine (SYNTHROID) 75 MCG tablet TAKE 1 TABLET BY MOUTH EVERY DAY 90 tablet 0   losartan (COZAAR) 50 MG tablet TAKE 1 TABLET BY MOUTH EVERY DAY 90 tablet 3   metFORMIN (GLUCOPHAGE) 1000 MG tablet TAKE 1 TABLET BY MOUTH TWICE A DAY 180 tablet 3   Multiple Vitamin (MULTIVITAMIN) tablet Take 1 tablet by mouth daily. Centrium Silver once daily     vitamin B-12 (CYANOCOBALAMIN) 1000 MCG tablet Take 1,000 mcg by mouth daily.     vitamin C (ASCORBIC ACID) 500 MG tablet Take 500 mg by mouth every evening.      No current facility-administered medications for this visit.    Allergies  Allergen Reactions   Food Anaphylaxis    TREE NUTS   Shellfish Allergy Anaphylaxis   Prednisone     "psychosis"   Tramadol Other (See Comments)    hallucinations   Venomil Honey Bee Venom [Honey Bee Venom] Swelling    Localized swelling    Social History   Socioeconomic History   Marital status: Married    Spouse name: [name]  Number of children: 3   Years of education: Not on file   Highest education level: Not on file  Occupational History   Not on file  Tobacco Use   Smoking status: Former    Packs/day: 1.50    Years: 20.00    Pack years: 30.00    Types: Cigarettes    Quit date: 08/24/1986    Years since quitting: 34.5   Smokeless tobacco: Never  Vaping Use   Vaping Use: Never used  Substance and Sexual Activity   Alcohol use: Not Currently    Comment: occ   Drug use: No   Sexual activity: Not on file  Other Topics Concern   Not on file  Social History Narrative   Married and lives with wife in 57 story home.  Retired.      Adult son with Down syndrome died in June 29, 2018.  2 other children living.   Social Determinants of Health   Financial  Resource Strain: Low Risk    Difficulty of Paying Living Expenses: Not hard at all  Food Insecurity: No Food Insecurity   Worried About Charity fundraiser in the Last Year: Never true   [place] in the Last Year: Never true  Transportation Needs: No Transportation Needs   Lack of Transportation (Medical): No   Lack of Transportation (Non-Medical): No  Physical Activity: Inactive   Days of Exercise per Week: 0 days   Minutes of Exercise per Session: 0 min  Stress: No Stress Concern Present   Feeling of Stress : Not at all  Social Connections: Moderately Integrated   Frequency of Communication with Friends and Family: More than three times a week   Frequency of Social Gatherings with Friends and Family: More than three times a week   Attends Religious Services: More than 4 times per year   Active Member of [employer] or Organizations: No   Attends Archivist Meetings: Never   Marital Status: Married  Human resources officer Violence: Not At Risk   Fear of Current or Ex-Partner: No   Emotionally Abused: No   Physically Abused: No   Sexually Abused: No     Review of Systems: As noted in HPI All other systems reviewed and are otherwise negative except as noted above.    Blood pressure (!) 150/73, pulse 68, height 5\' 7"  (1.702 m), weight 157 lb 3.2 oz (71.3 kg), SpO2 97 %.  GENERAL:  Well appearing pale WM in NAD HEENT:  PERRL, EOMI, sclera are clear. Oropharynx is clear. NECK:  No jugular venous distention, carotid upstroke brisk and symmetric, no bruits, no thyromegaly or adenopathy LUNGS:  Clear to auscultation bilaterally CHEST:  Unremarkable HEART:  RRR,  PMI not displaced or sustained,S1 and S2 within normal limits, no S3, no S4: no clicks, no rubs, soft 3-7/1 systolic murmur LSB ABD:  Soft, nontender. BS +, no masses or bruits. No hepatomegaly, no splenomegaly EXT:  2 + pulses throughout, no edema, no cyanosis no clubbing SKIN:  Warm and dry.  No rashes NEURO:  Alert  and oriented x 3. Cranial nerves II through XII intact. PSYCH:  Cognitively intact   EKG is done today. NSR rate 69.  Marked tremor artifact but no significant change from before. I have personally reviewed and interpreted this study.   Lab Results  Component Value Date   WBC 9.2 05/27/2020   HGB 14.2 05/27/2020   HCT 42.5 05/27/2020   PLT 176 05/27/2020   GLUCOSE 79  01/03/2021   CHOL 126 01/03/2021   TRIG 141.0 01/03/2021   HDL 45.90 01/03/2021   LDLDIRECT 84.1 05/21/2013   LDLCALC 52 01/03/2021   ALT 19 01/03/2021   AST 20 01/03/2021   NA 140 01/03/2021   K 5.0 01/03/2021   CL 101 01/03/2021   CREATININE 1.15 01/03/2021   BUN 23 01/03/2021   CO2 31 01/03/2021   TSH 3.71 01/03/2021   INR 1.63 (H) 03/18/2015   HGBA1C 6.0 (A) 01/03/2021   MICROALBUR 6.8 (H) 11/21/2009   Echo: 12/13/15: Study Conclusions   - Left ventricle: The cavity size was normal. Wall thickness was   increased in a pattern of mild LVH. Systolic function was mildly   to moderately reduced. The estimated ejection fraction was in the   range of 40% to 45%. Diffuse hypokinesis. There is dyskinesis of   the basalinferolateral myocardium. Doppler parameters are   consistent with abnormal left ventricular relaxation (grade 1   diastolic dysfunction). - Mitral valve: Calcified annulus. - Left atrium: The atrium was mildly dilated. - Pulmonary arteries: Systolic pressure was mildly increased. PA   peak pressure: 32 mm Hg (S).   Impressions:   - Global hypokinesis and dyskinesis of the basal inferior lateral   wall; overall mild to moderate LV dysfunction; grade 1 diastolic   dysfunction; mild LAE; mild TR with mildly elevated pulmonary   pressure.   Echo 07/08/2020  1. Hypokinesis of the inferobasal wall and dyskinesis of the basal  inferolateral wall; overall mild to moderate LV dysfunction; mild AS.   2. Left ventricular ejection fraction, by estimation, is 40 to 45%. The  left ventricle has mild to  moderately decreased function. The left  ventricle demonstrates regional wall motion abnormalities (see scoring  diagram/findings for description). There is   mild left ventricular hypertrophy. Left ventricular diastolic parameters  are consistent with Grade I diastolic dysfunction (impaired relaxation).  The average left ventricular global longitudinal strain is -12.9 %. The  global longitudinal strain is  abnormal.   3. Right ventricular systolic function is normal. The right ventricular  size is normal.   4. The mitral valve is normal in structure. Trivial mitral valve  regurgitation. No evidence of mitral stenosis.   5. The aortic valve is tricuspid. Aortic valve regurgitation is not  visualized. Mild aortic valve stenosis.   ASSESSMENT AND PLAN:  1. CAD s/p STEMI in December 2016. Urgent CABG for severe 3 vessel and left main disease. He is asymptomatic. Recommend continue ASA to 81 mg daily, continue beta blocker, and statin.  2. Ischemic cardiomyopathy. EF improved to 40-45%. Well compensated. Will continue Coreg  6.25 mg bid and losartan. If he should have progression would consider Entresto and/or SGLT 2 inhibitor   3. Hyperlipidemia. On lipitor. Excellent control. LDL 52.  4. Post op Afib. Resolved. No recurrence  5. DM type 2 with renal manifestations. Last A1c 6.0%. 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 Last creatinine 1.15.   7. S/p AAA repair.- remote  8. Diverticular bleed with anemia. No recurrence.  9. Mild aortic stenosis  I will follow up in one year.    Anadia Helmes Martinique MD,FACC  03/21/2021 10:53 AM

## 2021-03-21 ENCOUNTER — Encounter: Payer: Self-pay | Admitting: Cardiology

## 2021-03-21 ENCOUNTER — Ambulatory Visit: Payer: PPO | Admitting: Cardiology

## 2021-03-21 ENCOUNTER — Other Ambulatory Visit: Payer: Self-pay

## 2021-03-21 VITALS — BP 150/73 | HR 68 | Ht 67.0 in | Wt 157.2 lb

## 2021-03-21 DIAGNOSIS — E785 Hyperlipidemia, unspecified: Secondary | ICD-10-CM

## 2021-03-21 DIAGNOSIS — I255 Ischemic cardiomyopathy: Secondary | ICD-10-CM | POA: Diagnosis not present

## 2021-03-21 DIAGNOSIS — I25118 Atherosclerotic heart disease of native coronary artery with other forms of angina pectoris: Secondary | ICD-10-CM

## 2021-03-21 DIAGNOSIS — Z951 Presence of aortocoronary bypass graft: Secondary | ICD-10-CM

## 2021-03-21 DIAGNOSIS — I10 Essential (primary) hypertension: Secondary | ICD-10-CM | POA: Diagnosis not present

## 2021-03-21 DIAGNOSIS — I350 Nonrheumatic aortic (valve) stenosis: Secondary | ICD-10-CM | POA: Diagnosis not present

## 2021-03-21 NOTE — Patient Instructions (Signed)
Medication Instructions:  °No changes ° ° °*If you need a refill on your cardiac medications before your next appointment, please call your pharmacy* ° ° °Lab Work: ° °Not needed ° ° °Testing/Procedures: °Not needed ° ° °Follow-Up: °At [facility], you and your health needs are our priority.  As part of our continuing mission to provide you with exceptional heart care, we have created designated Provider Care Teams.  These Care Teams include your primary Cardiologist (physician) and Advanced Practice Providers (APPs -  Physician Assistants and Nurse Practitioners) who all work together to provide you with the care you need, when you need it. ° °  ° °Your next appointment:   °6 month(s) ° °The format for your next appointment:   °In Person ° °Provider:   °Peter Jordan, MD  ° ° ° °

## 2021-03-30 ENCOUNTER — Other Ambulatory Visit: Payer: Self-pay | Admitting: Family Medicine

## 2021-04-05 ENCOUNTER — Ambulatory Visit: Payer: PPO | Admitting: Cardiology

## 2021-04-07 DIAGNOSIS — N182 Chronic kidney disease, stage 2 (mild): Secondary | ICD-10-CM | POA: Diagnosis not present

## 2021-04-07 DIAGNOSIS — R419 Unspecified symptoms and signs involving cognitive functions and awareness: Secondary | ICD-10-CM | POA: Diagnosis not present

## 2021-04-07 DIAGNOSIS — Z8679 Personal history of other diseases of the circulatory system: Secondary | ICD-10-CM | POA: Diagnosis not present

## 2021-04-07 DIAGNOSIS — I255 Ischemic cardiomyopathy: Secondary | ICD-10-CM | POA: Diagnosis not present

## 2021-04-07 DIAGNOSIS — E1151 Type 2 diabetes mellitus with diabetic peripheral angiopathy without gangrene: Secondary | ICD-10-CM | POA: Diagnosis not present

## 2021-04-07 DIAGNOSIS — I700 Atherosclerosis of aorta: Secondary | ICD-10-CM | POA: Diagnosis not present

## 2021-04-07 DIAGNOSIS — I480 Paroxysmal atrial fibrillation: Secondary | ICD-10-CM | POA: Diagnosis not present

## 2021-04-07 DIAGNOSIS — I5042 Chronic combined systolic (congestive) and diastolic (congestive) heart failure: Secondary | ICD-10-CM | POA: Diagnosis not present

## 2021-04-07 DIAGNOSIS — D6869 Other thrombophilia: Secondary | ICD-10-CM | POA: Diagnosis not present

## 2021-04-07 DIAGNOSIS — E1122 Type 2 diabetes mellitus with diabetic chronic kidney disease: Secondary | ICD-10-CM | POA: Diagnosis not present

## 2021-04-07 DIAGNOSIS — F3342 Major depressive disorder, recurrent, in full remission: Secondary | ICD-10-CM | POA: Diagnosis not present

## 2021-04-07 DIAGNOSIS — Z7984 Long term (current) use of oral hypoglycemic drugs: Secondary | ICD-10-CM | POA: Diagnosis not present

## 2021-04-28 ENCOUNTER — Ambulatory Visit: Payer: PPO | Admitting: Family Medicine

## 2021-05-10 ENCOUNTER — Ambulatory Visit (INDEPENDENT_AMBULATORY_CARE_PROVIDER_SITE_OTHER): Payer: PPO | Admitting: Family Medicine

## 2021-05-10 VITALS — BP 120/60 | HR 70 | Temp 98.0°F | Wt 154.5 lb

## 2021-05-10 DIAGNOSIS — Z9181 History of falling: Secondary | ICD-10-CM

## 2021-05-10 DIAGNOSIS — R5383 Other fatigue: Secondary | ICD-10-CM

## 2021-05-10 DIAGNOSIS — E039 Hypothyroidism, unspecified: Secondary | ICD-10-CM

## 2021-05-10 DIAGNOSIS — I10 Essential (primary) hypertension: Secondary | ICD-10-CM

## 2021-05-10 NOTE — Patient Instructions (Signed)
I will set up physical therapy referral.

## 2021-05-10 NOTE — Progress Notes (Signed)
Established Patient Office Visit  Subjective:  Patient ID: Miguel Vazquez, male    DOB: 11/20/1940  Age: 81 y.o. MRN: [id number]  CC:  Chief Complaint  Patient presents with   Fatigue    Very fatigued, no energy, wants to sleep all the time, x 2 weeks    HPI BADEN BETSCH presents for predominantly increased fatigue over the past few weeks.  His wife states he has low motivation to do any activities and seems to be very slow in his movements.  He had multiple falls previously including fairly severe fall over a year ago with nonsurgical fracture of the hip.  He saw physical therapist out in [facility] and benefited tremendously from that last year.  No recent fever.  Denies any chest pains.  No dyspnea.  He states he sleeps about 9 hours at night generally uninterrupted.  Feels he is getting good quality sleep.  Good appetite.  Wife thinks he is walking slower than usual and he attributes some of this to fear of falls.  He is followed by cardiology regularly.  Had echocardiogram May 22 with ejection fraction 40 to 45%.  No orthopnea.  No peripheral edema.  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  Recently had extensive labs evaluating polyneuropathy.  These were basically unremarkable.  He does have hypothyroidism and is on replacement.  Last TSH was normal.  Had MRI brain 07/07/2020 which showed moderate changes of generalized cerebral atrophy and small vessel disease but no acute abnormalities.  Wt Readings from Last 3 Encounters:  05/10/21 154 lb 8 oz (70.1 kg)  03/21/21 157 lb 3.2 oz (71.3 kg)  02/20/21 157 lb 6.4 oz (71.4 kg)     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   Diabetes mellitus    Diverticulitis    Gait abnormality 01/11/2021   GERD  (gastroesophageal reflux disease)    Hx of abdominal aortic aneurysm 1998   Hyperlipidemia    Hypertension    Lower GI bleed    Myocardial infarction ([place])    2016   Personal history of colonic polyps - adenomas 09/09/2013    Past Surgical History:  Procedure Laterality Date   ABDOMINAL AORTIC ANEURYSM REPAIR  1998   CARDIAC CATHETERIZATION N/A 03/18/2015   Procedure: Left Heart Cath and Coronary Angiography;  Surgeon: Peter M Martinique, MD;  Location: [place] [facility];  Service: Cardiovascular;  Laterality: N/A;   CATARACT EXTRACTION Right    COLONOSCOPY     CORONARY ARTERY BYPASS GRAFT N/A 03/18/2015   Procedure: CORONARY ARTERY BYPASS GRAFTING (CABG) x  three, using left internal mammary artery and right leg greater saphenous vein harvested endoscopically;  Surgeon: Melrose Nakayama, MD;  Location: [place];  Service: Open Heart Surgery;  Laterality: N/A;   INGUINAL HERNIA REPAIR Bilateral    INTRAOPERATIVE TRANSESOPHAGEAL ECHOCARDIOGRAM N/A 03/18/2015   Procedure: INTRAOPERATIVE TRANSESOPHAGEAL ECHOCARDIOGRAM;  Surgeon: Melrose Nakayama, MD;  Location: [place];  Service: Open Heart Surgery;  Laterality: N/A;   UMBILICAL HERNIA REPAIR  1965    Family History  Problem Relation Age of Onset   Bone cancer Mother    Diabetes Paternal Aunt    Stroke  Son    Down syndrome Son    Colon cancer Neg Hx    Esophageal cancer Neg Hx    Rectal cancer Neg Hx    Stomach cancer Neg Hx     Social History   Socioeconomic History   Marital status: Married    Spouse name: [name]   Number of children: 3   Years of education: Not on file   Highest education level: Not on file  Occupational History   Not on file  Tobacco Use   Smoking status: Former    Packs/day: 1.50    Years: 20.00    Pack years: 30.00    Types: Cigarettes    Quit date: 08/24/1986    Years since quitting: 34.7   Smokeless tobacco: Never  Vaping Use   Vaping Use: Never used  Substance and Sexual Activity    Alcohol use: Not Currently    Comment: occ   Drug use: No   Sexual activity: Not on file  Other Topics Concern   Not on file  Social History Narrative   Married and lives with wife in 40 story home.  Retired.      Adult son with Down syndrome died in Jun 30, 2018.  2 other children living.   Social Determinants of Health   Financial Resource Strain: Low Risk    Difficulty of Paying Living Expenses: Not hard at all  Food Insecurity: No Food Insecurity   Worried About Charity fundraiser in the Last Year: Never true   [place] in the Last Year: Never true  Transportation Needs: No Transportation Needs   Lack of Transportation (Medical): No   Lack of Transportation (Non-Medical): No  Physical Activity: Inactive   Days of Exercise per Week: 0 days   Minutes of Exercise per Session: 0 min  Stress: No Stress Concern Present   Feeling of Stress : Not at all  Social Connections: Moderately Integrated   Frequency of Communication with Friends and Family: More than three times a week   Frequency of Social Gatherings with Friends and Family: More than three times a week   Attends Religious Services: More than 4 times per year   Active Member of [employer] or Organizations: No   Attends Archivist Meetings: Never   Marital Status: Married  Human resources officer Violence: Not At Risk   Fear of Current or Ex-Partner: No   Emotionally Abused: No   Physically Abused: No   Sexually Abused: No    Outpatient Medications Prior to Visit  Medication Sig Dispense Refill   aspirin EC 81 MG tablet Take 1 tablet (81 mg total) by mouth daily. 90 tablet 3   atorvastatin (LIPITOR) 80 MG tablet TAKE 1 TABLET BY MOUTH DAILY AT 6 PM. 90 tablet 2   carvedilol (COREG) 6.25 MG tablet TAKE 1 TABLET BY MOUTH TWICE A DAY 180 tablet 3   donepezil (ARICEPT) 10 MG tablet TAKE 1 TABLET BY MOUTH EVERYDAY AT BEDTIME 90 tablet 3   EPINEPHrine 0.3 mg/0.3 mL IJ SOAJ injection Inject 0.3 mLs (0.3 mg total) into the  muscle as needed for anaphylaxis. 2 each 3   escitalopram (LEXAPRO) 10 MG tablet TAKE 1 TABLET BY MOUTH EVERY DAY 90 tablet 0   ferrous sulfate 324 MG TBEC Take 324 mg by mouth.     glucose blood (FREESTYLE LITE) test strip Check Blood Sugars 1-2 times per day. DX: E11.9. 100 each 5   Lancets (FREESTYLE) lancets Check Blood Sugars  1-2 times per day. DX: E11.9 100 each 5   levothyroxine (SYNTHROID) 75 MCG tablet TAKE 1 TABLET BY MOUTH EVERY DAY 90 tablet 0   losartan (COZAAR) 50 MG tablet TAKE 1 TABLET BY MOUTH EVERY DAY 90 tablet 3   metFORMIN (GLUCOPHAGE) 1000 MG tablet TAKE 1 TABLET BY MOUTH TWICE A DAY 180 tablet 3   Multiple Vitamin (MULTIVITAMIN) tablet Take 1 tablet by mouth daily. Centrium Silver once daily     vitamin B-12 (CYANOCOBALAMIN) 1000 MCG tablet Take 1,000 mcg by mouth daily.     vitamin C (ASCORBIC ACID) 500 MG tablet Take 500 mg by mouth every evening.      No facility-administered medications prior to visit.    Allergies  Allergen Reactions   Food Anaphylaxis    TREE NUTS   Shellfish Allergy Anaphylaxis   Prednisone     "psychosis"   Tramadol Other (See Comments)    hallucinations   Venomil Honey Bee Venom [Honey Bee Venom] Swelling    Localized swelling    ROS Review of Systems  Constitutional:  Positive for fatigue. Negative for appetite change, chills, fever and unexpected weight change.  HENT:  Negative for trouble swallowing.   Eyes:  Negative for visual disturbance.  Respiratory:  Negative for cough and shortness of breath.   Cardiovascular:  Negative for chest pain, palpitations and leg swelling.  Gastrointestinal:  Negative for abdominal pain, diarrhea, nausea and vomiting.  Endocrine: Negative for polydipsia and polyuria.  Genitourinary:  Negative for difficulty urinating and dysuria.  Neurological:  Negative for dizziness, syncope and headaches.     Objective:    Physical Exam Vitals reviewed.  Constitutional:      General: He is not in  acute distress.    Appearance: Normal appearance. He is not ill-appearing.  Cardiovascular:     Rate and Rhythm: Normal rate.     Comments: He has 2/6 to 3/6 systolic murmur left sternal border Pulmonary:     Effort: Pulmonary effort is normal.     Breath sounds: Normal breath sounds. No wheezing or rales.  Musculoskeletal:     Cervical back: Neck supple.     Right lower leg: No edema.     Left lower leg: No edema.  Lymphadenopathy:     Cervical: No cervical adenopathy.  Neurological:     Mental Status: He is alert.    BP 120/60 (BP Location: Left Arm, Patient Position: Sitting, Cuff Size: Normal)    Pulse 70    Temp 98 F (36.7 C) (Oral)    Wt 154 lb 8 oz (70.1 kg)    SpO2 95%    BMI 24.20 kg/m  Wt Readings from Last 3 Encounters:  05/10/21 154 lb 8 oz (70.1 kg)  03/21/21 157 lb 3.2 oz (71.3 kg)  02/20/21 157 lb 6.4 oz (71.4 kg)     Health Maintenance Due  Topic Date Due   Zoster Vaccines- Shingrix (1 of 2) Never done   OPHTHALMOLOGY EXAM  07/13/2015   COVID-19 Vaccine (5 - Booster for Pfizer series) 10/31/2020    There are no preventive care reminders to display for this patient.  Lab Results  Component Value Date   TSH 3.71 01/03/2021   Lab Results  Component Value Date   WBC 9.2 05/27/2020   HGB 14.2 05/27/2020   HCT 42.5 05/27/2020   MCV 99.1 05/27/2020   PLT 176 05/27/2020   Lab Results  Component Value Date   NA 140 01/03/2021  K 5.0 01/03/2021   CO2 31 01/03/2021   GLUCOSE 79 01/03/2021   BUN 23 01/03/2021   CREATININE 1.15 01/03/2021   BILITOT 0.6 01/03/2021   ALKPHOS 103 01/03/2021   AST 20 01/03/2021   ALT 19 01/03/2021   PROT 6.7 01/11/2021   ALBUMIN 4.2 01/03/2021   CALCIUM 10.1 01/03/2021   ANIONGAP 12 05/27/2020   GFR 60.77 01/03/2021   Lab Results  Component Value Date   CHOL 126 01/03/2021   Lab Results  Component Value Date   HDL 45.90 01/03/2021   Lab Results  Component Value Date   LDLCALC 52 01/03/2021   Lab Results   Component Value Date   TRIG 141.0 01/03/2021   Lab Results  Component Value Date   CHOLHDL 3 01/03/2021   Lab Results  Component Value Date   HGBA1C 6.0 (A) 01/03/2021      Assessment & Plan:   #1 progressive fatigue past few weeks.  Nonfocal exam.  No recent fever.  Appetite and weight stable.  Denies chest pains.  No dyspnea.  Etiology unclear.  He has been very inactive which may be contributing.  He does have hypothyroidism but most recent TSH was at goal. Does not appear to be depressed and denies any depression symptoms.  Is also on Lexapro 10 mg daily.  -Recheck TSH and BMP -We recommend try to gradually increase his physical activity.  He and his wife plan to start walking more soon.  #2 history of falls.  He does express some concern for fear of falling given his past history.  He has benefited tremendously from physical therapy in the past.  We will try to set up with physical therapist again for fall risk assessment and fall risk reduction.  Handout on fall prevention given.  #3 history of type 2 diabetes.  Blood sugars been very well controlled.  Last A1c 6.0%.    No orders of the defined types were placed in this encounter.   Follow-up: No follow-ups on file.    Carolann Littler, MD

## 2021-05-11 LAB — BASIC METABOLIC PANEL
BUN: 27 mg/dL — ABNORMAL HIGH (ref 6–23)
CO2: 31 mEq/L (ref 19–32)
Calcium: 10.2 mg/dL (ref 8.4–10.5)
Chloride: 100 mEq/L (ref 96–112)
Creatinine, Ser: 1.29 mg/dL (ref 0.40–1.50)
GFR: 52.82 mL/min — ABNORMAL LOW (ref >60.00)
Glucose, Bld: 159 mg/dL — ABNORMAL HIGH (ref 70–99)
Potassium: 4.4 mEq/L (ref 3.5–5.1)
Sodium: 141 mEq/L (ref 135–145)

## 2021-05-11 LAB — TSH: TSH: 1.76 u[IU]/mL (ref 0.35–5.50)

## 2021-05-22 ENCOUNTER — Other Ambulatory Visit: Payer: Self-pay | Admitting: Family Medicine

## 2021-05-30 ENCOUNTER — Telehealth: Payer: Self-pay | Admitting: Diagnostic Neuroimaging

## 2021-05-30 NOTE — Telephone Encounter (Signed)
LVM for patient this his appointment was pushed out a week due to Dr. Leta Baptist being off and to call us back if that date/time will not work for him.

## 2021-06-06 DIAGNOSIS — R42 Dizziness and giddiness: Secondary | ICD-10-CM | POA: Diagnosis not present

## 2021-06-06 DIAGNOSIS — R2689 Other abnormalities of gait and mobility: Secondary | ICD-10-CM | POA: Diagnosis not present

## 2021-06-06 DIAGNOSIS — R531 Weakness: Secondary | ICD-10-CM | POA: Diagnosis not present

## 2021-06-12 DIAGNOSIS — R42 Dizziness and giddiness: Secondary | ICD-10-CM | POA: Diagnosis not present

## 2021-06-12 DIAGNOSIS — R531 Weakness: Secondary | ICD-10-CM | POA: Diagnosis not present

## 2021-06-12 DIAGNOSIS — E119 Type 2 diabetes mellitus without complications: Secondary | ICD-10-CM | POA: Diagnosis not present

## 2021-06-12 DIAGNOSIS — R2689 Other abnormalities of gait and mobility: Secondary | ICD-10-CM | POA: Diagnosis not present

## 2021-06-12 LAB — HM DIABETES EYE EXAM

## 2021-06-19 DIAGNOSIS — R531 Weakness: Secondary | ICD-10-CM | POA: Diagnosis not present

## 2021-06-19 DIAGNOSIS — R42 Dizziness and giddiness: Secondary | ICD-10-CM | POA: Diagnosis not present

## 2021-06-19 DIAGNOSIS — R2689 Other abnormalities of gait and mobility: Secondary | ICD-10-CM | POA: Diagnosis not present

## 2021-06-21 DIAGNOSIS — R531 Weakness: Secondary | ICD-10-CM | POA: Diagnosis not present

## 2021-06-21 DIAGNOSIS — R42 Dizziness and giddiness: Secondary | ICD-10-CM | POA: Diagnosis not present

## 2021-06-21 DIAGNOSIS — R2689 Other abnormalities of gait and mobility: Secondary | ICD-10-CM | POA: Diagnosis not present

## 2021-06-23 ENCOUNTER — Ambulatory Visit (INDEPENDENT_AMBULATORY_CARE_PROVIDER_SITE_OTHER): Payer: PPO | Admitting: Family Medicine

## 2021-06-23 VITALS — BP 112/70 | HR 67 | Temp 97.6°F | Ht 67.0 in | Wt 153.0 lb

## 2021-06-23 DIAGNOSIS — R296 Repeated falls: Secondary | ICD-10-CM

## 2021-06-23 DIAGNOSIS — I10 Essential (primary) hypertension: Secondary | ICD-10-CM | POA: Diagnosis not present

## 2021-06-23 DIAGNOSIS — R42 Dizziness and giddiness: Secondary | ICD-10-CM

## 2021-06-23 NOTE — Progress Notes (Signed)
? ?Established Patient Office Visit ? ?Subjective:  ?Patient ID: Miguel Vazquez, male    DOB: Jul 30, 1940  Age: 81 y.o. MRN: [id number] ? ?CC:  ?Chief Complaint  ?Patient presents with  ? Fall  ? ? ?HPI ?Miguel Vazquez presents for recent fall.  He has unstable gait and high risk for falls and we had sent him for physical therapy because of this last month.  He apparently had a fall at home on 06-20-2021.  He states he had gone to an out building and was caring something in his arms back up but he will.  His recollection was that he was near an old railroad tie and he thinks he tripped over that.  He does not recall losing consciousness.  No head injury.  No neck injury.  Denied any extremity injury.  Denies any significant orthostatic symptoms.  He went to physical therapist and they did very thorough orthostatics with no change.  He did have positive Dix-Hallpike maneuver to the right and Epley maneuvers were done.  Still feels a little bit dizzy at times with sudden head movement but overall vertigo improved. ? ?Does complain of some general decreased stamina.  Recent TSH and other labs unremarkable.  He does have a cane for ambulation but not consistently using this. ? ?Past Medical History:  ?Diagnosis Date  ? Acute blood loss anemia   ? Arthritis   ? Brain aneurysm 2018  ? followed by Dr [name]  ? Cataract   ? Chronic kidney disease   ? kidney stone- 40 years ago  ? Coronary artery disease   ? Cyst of pancreas 07/23/2019  ? Diabetes mellitus   ? Diverticulitis   ? Gait abnormality 01/11/2021  ? GERD (gastroesophageal reflux disease)   ? Hx of abdominal aortic aneurysm 1998  ? Hyperlipidemia   ? Hypertension   ? Lower GI bleed   ? Myocardial infarction [facility])   ? 2016  ? Personal history of colonic polyps - adenomas 09/09/2013  ? ? ?Past Surgical History:  ?Procedure Laterality Date  ? ABDOMINAL AORTIC ANEURYSM REPAIR  1998  ? CARDIAC CATHETERIZATION N/A 03/18/2015  ? Procedure: Left Heart Cath and Coronary Angiography;   Surgeon: Peter M Martinique, MD;  Location: [place] [facility];  Service: Cardiovascular;  Laterality: N/A;  ? CATARACT EXTRACTION Right   ? COLONOSCOPY    ? CORONARY ARTERY BYPASS GRAFT N/A 03/18/2015  ? Procedure: CORONARY ARTERY BYPASS GRAFTING (CABG) x  three, using left internal mammary artery and right leg greater saphenous vein harvested endoscopically;  Surgeon: Melrose Nakayama, MD;  Location: [place];  Service: Open Heart Surgery;  Laterality: N/A;  ? INGUINAL HERNIA REPAIR Bilateral   ? INTRAOPERATIVE TRANSESOPHAGEAL ECHOCARDIOGRAM N/A 03/18/2015  ? Procedure: INTRAOPERATIVE TRANSESOPHAGEAL ECHOCARDIOGRAM;  Surgeon: Melrose Nakayama, MD;  Location: [place];  Service: Open Heart Surgery;  Laterality: N/A;  ? [place]  ? ? ?Family History  ?Problem Relation Age of Onset  ? Bone cancer Mother   ? Diabetes Paternal Aunt   ? Stroke Son   ? Down syndrome Son   ? Colon cancer Neg Hx   ? Esophageal cancer Neg Hx   ? Rectal cancer Neg Hx   ? Stomach cancer Neg Hx   ? ? ?Social History  ? ?Socioeconomic History  ? Marital status: Married  ?  Spouse name: [name]  ? Number of children: 3  ? Years of education: Not on file  ? Highest  education level: Not on file  ?Occupational History  ? Not on file  ?Tobacco Use  ? Smoking status: Former  ?  Packs/day: 1.50  ?  Years: 20.00  ?  Pack years: 30.00  ?  Types: Cigarettes  ?  Quit date: 08/24/1986  ?  Years since quitting: 34.8  ? Smokeless tobacco: Never  ?Vaping Use  ? Vaping Use: Never used  ?Substance and Sexual Activity  ? Alcohol use: Not Currently  ?  Comment: occ  ? Drug use: No  ? Sexual activity: Not on file  ?Other Topics Concern  ? Not on file  ?Social History Narrative  ? Married and lives with wife in 45 story home.  Retired.  ?   ? Adult son with Down syndrome died in July 04, 2018.  2 other children living.  ? ?Social Determinants of Health  ? ?Financial Resource Strain: Low Risk   ? Difficulty of Paying Living Expenses: Not hard at all  ?Food  Insecurity: No Food Insecurity  ? Worried About Charity fundraiser in the Last Year: Never true  ? Ran Out of Food in the Last Year: Never true  ?Transportation Needs: No Transportation Needs  ? Lack of Transportation (Medical): No  ? Lack of Transportation (Non-Medical): No  ?Physical Activity: Inactive  ? Days of Exercise per Week: 0 days  ? Minutes of Exercise per Session: 0 min  ?Stress: No Stress Concern Present  ? Feeling of Stress : Not at all  ?Social Connections: Moderately Integrated  ? Frequency of Communication with Friends and Family: More than three times a week  ? Frequency of Social Gatherings with Friends and Family: More than three times a week  ? Attends Religious Services: More than 4 times per year  ? Active Member of Clubs or Organizations: No  ? Attends Archivist Meetings: Never  ? Marital Status: Married  ?Intimate Partner Violence: Not At Risk  ? Fear of Current or Ex-Partner: No  ? Emotionally Abused: No  ? Physically Abused: No  ? Sexually Abused: No  ? ? ?Outpatient Medications Prior to Visit  ?Medication Sig Dispense Refill  ? aspirin EC 81 MG tablet Take 1 tablet (81 mg total) by mouth daily. 90 tablet 3  ? atorvastatin (LIPITOR) 80 MG tablet TAKE 1 TABLET BY MOUTH DAILY AT 6 PM. 90 tablet 2  ? carvedilol (COREG) 6.25 MG tablet TAKE 1 TABLET BY MOUTH TWICE A DAY 180 tablet 3  ? donepezil (ARICEPT) 10 MG tablet TAKE 1 TABLET BY MOUTH EVERYDAY AT BEDTIME 90 tablet 3  ? EPINEPHrine 0.3 mg/0.3 mL IJ SOAJ injection Inject 0.3 mLs (0.3 mg total) into the muscle as needed for anaphylaxis. 2 each 3  ? escitalopram (LEXAPRO) 10 MG tablet TAKE 1 TABLET BY MOUTH EVERY DAY 90 tablet 0  ? ferrous sulfate 324 MG TBEC Take 324 mg by mouth.    ? glucose blood (FREESTYLE LITE) test strip Check Blood Sugars 1-2 times per day. DX: E11.9. 100 each 5  ? Lancets (FREESTYLE) lancets Check Blood Sugars 1-2 times per day. DX: E11.9 100 each 5  ? levothyroxine (SYNTHROID) 75 MCG tablet TAKE 1 TABLET  BY MOUTH EVERY DAY 90 tablet 0  ? losartan (COZAAR) 50 MG tablet TAKE 1 TABLET BY MOUTH EVERY DAY 90 tablet 3  ? metFORMIN (GLUCOPHAGE) 1000 MG tablet TAKE 1 TABLET BY MOUTH TWICE A DAY 180 tablet 3  ? Multiple Vitamin (MULTIVITAMIN) tablet Take 1 tablet by mouth daily.  Centrium Silver once daily    ? vitamin B-12 (CYANOCOBALAMIN) 1000 MCG tablet Take 1,000 mcg by mouth daily.    ? vitamin C (ASCORBIC ACID) 500 MG tablet Take 500 mg by mouth every evening.     ? ?No facility-administered medications prior to visit.  ? ? ?Allergies  ?Allergen Reactions  ? Food Anaphylaxis  ?  TREE NUTS  ? Shellfish Allergy Anaphylaxis  ? Prednisone   ?  "psychosis"  ? Tramadol Other (See Comments)  ?  hallucinations  ? Venomil Honey Bee Venom [Honey Bee Venom] Swelling  ?  Localized swelling  ? ? ?ROS ?Review of Systems  ?Constitutional:  Positive for fatigue. Negative for unexpected weight change.  ?Eyes:  Negative for visual disturbance.  ?Respiratory:  Negative for cough, chest tightness and shortness of breath.   ?Cardiovascular:  Negative for chest pain, palpitations and leg swelling.  ?Endocrine: Negative for polydipsia and polyuria.  ?Neurological:  Negative for dizziness, tremors, syncope, speech difficulty, weakness, light-headedness and headaches.  ? ?  ?Objective:  ?  ?Physical Exam ?Vitals reviewed.  ?Constitutional:   ?   Appearance: Normal appearance.  ?HENT:  ?   Head: Normocephalic and atraumatic.  ?Cardiovascular:  ?   Rate and Rhythm: Normal rate and regular rhythm.  ?Pulmonary:  ?   Effort: Pulmonary effort is normal.  ?   Breath sounds: Normal breath sounds.  ?Musculoskeletal:  ?   Cervical back: Neck supple.  ?   Right lower leg: No edema.  ?   Left lower leg: No edema.  ?   Comments: No upper extremity tenderness.  No visible swelling.  No ecchymosis.  ?Neurological:  ?   Mental Status: He is alert.  ?   Comments: Excellent strength lower extremities with plantarflexion, dorsiflexion, knee extension, and hip  flexion bilaterally.  ? ? ?BP 112/70 (BP Location: Left Arm, Patient Position: Sitting, Cuff Size: Normal)   Pulse 67   Temp 97.6 ?F (36.4 ?C) (Oral)   Ht '5\' 7"'$  (1.702 m)   Wt 153 lb (69.4 kg)   SpO2 94%

## 2021-06-26 ENCOUNTER — Other Ambulatory Visit: Payer: Self-pay | Admitting: Family Medicine

## 2021-06-26 DIAGNOSIS — R2689 Other abnormalities of gait and mobility: Secondary | ICD-10-CM | POA: Diagnosis not present

## 2021-06-26 DIAGNOSIS — R42 Dizziness and giddiness: Secondary | ICD-10-CM | POA: Diagnosis not present

## 2021-06-26 DIAGNOSIS — R531 Weakness: Secondary | ICD-10-CM | POA: Diagnosis not present

## 2021-06-28 DIAGNOSIS — R42 Dizziness and giddiness: Secondary | ICD-10-CM | POA: Diagnosis not present

## 2021-06-28 DIAGNOSIS — R2689 Other abnormalities of gait and mobility: Secondary | ICD-10-CM | POA: Diagnosis not present

## 2021-06-28 DIAGNOSIS — R531 Weakness: Secondary | ICD-10-CM | POA: Diagnosis not present

## 2021-06-29 ENCOUNTER — Encounter: Payer: Self-pay | Admitting: Family Medicine

## 2021-07-04 ENCOUNTER — Ambulatory Visit: Payer: PPO | Admitting: Family Medicine

## 2021-07-10 ENCOUNTER — Ambulatory Visit: Payer: PPO | Admitting: Diagnostic Neuroimaging

## 2021-07-11 DIAGNOSIS — S40022A Contusion of left upper arm, initial encounter: Secondary | ICD-10-CM | POA: Diagnosis not present

## 2021-07-17 ENCOUNTER — Encounter: Payer: Self-pay | Admitting: Diagnostic Neuroimaging

## 2021-07-17 ENCOUNTER — Ambulatory Visit: Payer: PPO | Admitting: Diagnostic Neuroimaging

## 2021-07-17 VITALS — BP 128/82 | HR 75 | Ht 67.0 in | Wt 152.0 lb

## 2021-07-17 DIAGNOSIS — R413 Other amnesia: Secondary | ICD-10-CM | POA: Diagnosis not present

## 2021-07-17 NOTE — Progress Notes (Signed)
? ?[facility] ? ?PATIENT: Miguel Vazquez ?DOB: 11-14-1940 ? ?REFERRING CLINICIAN: Eulas Post, MD ?HISTORY FROM: patient ?REASON FOR VISIT: follow up ? ? ?HISTORICAL ? ?CHIEF COMPLAINT:  ?Chief Complaint  ?Patient presents with  ? Memory Loss  ?  Rm 7 New Pt, TOC Dr Miguel Vazquez, wife- Miguel Vazquez  MMSE 21  ? Gait Problem  ?  "Falls without serious injury"  ? ? ?HISTORY OF PRESENT ILLNESS:  ? ?UPDATE (07/17/21, [facility]P): Since last visit, doing about the same. Mild memory issues since past 1-2 years. Some changes in ADLs (occ gets lost driving, but usually with wife to help). 10th grade education. Also with some neuropathy (unexplained). Some balance issues. Wife also having some memory loss issues. ? ?PRIOR HPI (01/11/21, Dr. Jannifer Vazquez): [name] is a 81 year old right-handed white male with a history of a memory disorder, he is currently on Aricept and tolerates this well.  He has had some slowness of gait that has developed over the last year or so.  The patient does have a history of diabetes.  On the prior examination, he was noted to have sensory alteration in the feet consistent with a peripheral neuropathy.  The patient reports no burning or stinging in the feet.  He did fall recently, he landed on his buttocks, he did have an x-ray that did not show evidence of a fracture.  He has not had any other falls.  He denies issues controlling the bowels or the bladder.  He has not had any further episodes of confusion.  He returns to the office today for an evaluation.  He claims that he sleeps fairly well at night. ? ? ? ?REVIEW OF SYSTEMS: Full 14 system review of systems performed and negative with exception of: as per HPI. ? ?ALLERGIES: ?Allergies  ?Allergen Reactions  ? Food Anaphylaxis  ?  TREE NUTS  ? Shellfish Allergy Anaphylaxis  ? Prednisone   ?  "psychosis"  ? Tramadol Other (See Comments)  ?  hallucinations  ? Venomil Honey Bee Venom [Honey Bee Venom] Swelling  ?  Localized swelling  ? ? ?HOME  MEDICATIONS: ?Outpatient Medications Prior to Visit  ?Medication Sig Dispense Refill  ? aspirin EC 81 MG tablet Take 1 tablet (81 mg total) by mouth daily. 90 tablet 3  ? atorvastatin (LIPITOR) 80 MG tablet TAKE 1 TABLET BY MOUTH DAILY AT 6 PM. 90 tablet 2  ? carvedilol (COREG) 6.25 MG tablet TAKE 1 TABLET BY MOUTH TWICE A DAY 180 tablet 3  ? donepezil (ARICEPT) 10 MG tablet TAKE 1 TABLET BY MOUTH EVERYDAY AT BEDTIME 90 tablet 3  ? EPINEPHrine 0.3 mg/0.3 mL IJ SOAJ injection Inject 0.3 mLs (0.3 mg total) into the muscle as needed for anaphylaxis. 2 each 3  ? escitalopram (LEXAPRO) 10 MG tablet TAKE 1 TABLET BY MOUTH EVERY DAY 90 tablet 0  ? ferrous sulfate 324 MG TBEC Take 324 mg by mouth.    ? levothyroxine (SYNTHROID) 75 MCG tablet TAKE 1 TABLET BY MOUTH EVERY DAY 90 tablet 0  ? losartan (COZAAR) 50 MG tablet TAKE 1 TABLET BY MOUTH EVERY DAY 90 tablet 3  ? metFORMIN (GLUCOPHAGE) 1000 MG tablet TAKE 1 TABLET BY MOUTH TWICE A DAY 180 tablet 3  ? Multiple Vitamin (MULTIVITAMIN) tablet Take 1 tablet by mouth daily. Centrium Silver once daily    ? vitamin B-12 (CYANOCOBALAMIN) 1000 MCG tablet Take 1,000 mcg by mouth daily.    ? vitamin C (ASCORBIC ACID) 500 MG tablet  Take 500 mg by mouth every evening.     ? glucose blood (FREESTYLE LITE) test strip Check Blood Sugars 1-2 times per day. DX: E11.9. (Patient not taking: Reported on 07/17/2021) 100 each 5  ? Lancets (FREESTYLE) lancets Check Blood Sugars 1-2 times per day. DX: E11.9 (Patient not taking: Reported on 07/17/2021) 100 each 5  ? ?No facility-administered medications prior to visit.  ? ? ?PAST MEDICAL HISTORY: ?Past Medical History:  ?Diagnosis Date  ? Acute blood loss anemia   ? Arthritis   ? Brain aneurysm 2016-07-09  ? followed by Dr Miguel Vazquez  ? Cataract   ? Chronic kidney disease   ? kidney stone- 40 years ago  ? Coronary artery disease   ? Cyst of pancreas 07/23/2019  ? Diabetes mellitus   ? Diverticulitis   ? Gait abnormality 01/11/2021  ? GERD (gastroesophageal reflux  disease)   ? Hx of abdominal aortic aneurysm 1998  ? Hyperlipidemia   ? Hypertension   ? Lower GI bleed   ? Memory loss   ? Myocardial infarction [facility])   ? 10-Jul-2014  ? Personal history of colonic polyps - adenomas 09/09/2013  ? ? ?PAST SURGICAL HISTORY: ?Past Surgical History:  ?Procedure Laterality Date  ? ABDOMINAL AORTIC ANEURYSM REPAIR  1998  ? CARDIAC CATHETERIZATION N/A 03/18/2015  ? Procedure: Left Heart Cath and Coronary Angiography;  Surgeon: Peter M Martinique, MD;  Location: [place] [facility];  Service: Cardiovascular;  Laterality: N/A;  ? CATARACT EXTRACTION Right   ? COLONOSCOPY    ? CORONARY ARTERY BYPASS GRAFT N/A 03/18/2015  ? Procedure: CORONARY ARTERY BYPASS GRAFTING (CABG) x  three, using left internal mammary artery and right leg greater saphenous vein harvested endoscopically;  Surgeon: Melrose Nakayama, MD;  Location: [place];  Service: Open Heart Surgery;  Laterality: N/A;  ? INGUINAL HERNIA REPAIR Bilateral   ? INTRAOPERATIVE TRANSESOPHAGEAL ECHOCARDIOGRAM N/A 03/18/2015  ? Procedure: INTRAOPERATIVE TRANSESOPHAGEAL ECHOCARDIOGRAM;  Surgeon: Melrose Nakayama, MD;  Location: [place];  Service: Open Heart Surgery;  Laterality: N/A;  ? [place]  ? ? ?FAMILY HISTORY: ?Family History  ?Problem Relation Age of Onset  ? Bone cancer Mother   ? Diabetes Paternal Aunt   ? Stroke Son   ? Down syndrome Son   ? Colon cancer Neg Hx   ? Esophageal cancer Neg Hx   ? Rectal cancer Neg Hx   ? Stomach cancer Neg Hx   ? ? ?SOCIAL HISTORY: ?Social History  ? ?Socioeconomic History  ? Marital status: Married  ?  Spouse name: Miguel Vazquez  ? Number of children: 3  ? Years of education: 10  ? Highest education level: 10th grade  ?Occupational History  ? Not on file  ?Tobacco Use  ? Smoking status: Former  ?  Packs/day: 1.50  ?  Years: 20.00  ?  Pack years: 30.00  ?  Types: Cigarettes  ?  Quit date: 08/24/1986  ?  Years since quitting: 34.9  ? Smokeless tobacco: Never  ?Vaping Use  ? Vaping Use: Never used   ?Substance and Sexual Activity  ? Alcohol use: Not Currently  ?  Comment: occ  ? Drug use: No  ? Sexual activity: Not on file  ?Other Topics Concern  ? Not on file  ?Social History Narrative  ? 07/17/21 Married and lives with wife in 4 story home.  Retired.  ?   ? Adult son with Down syndrome died in Jul 10, 2018.  2 other children  living.  ? ?Social Determinants of Health  ? ?Financial Resource Strain: Low Risk   ? Difficulty of Paying Living Expenses: Not hard at all  ?Food Insecurity: No Food Insecurity  ? Worried About Charity fundraiser in the Last Year: Never true  ? Ran Out of Food in the Last Year: Never true  ?Transportation Needs: No Transportation Needs  ? Lack of Transportation (Medical): No  ? Lack of Transportation (Non-Medical): No  ?Physical Activity: Inactive  ? Days of Exercise per Week: 0 days  ? Minutes of Exercise per Session: 0 min  ?Stress: No Stress Concern Present  ? Feeling of Stress : Not at all  ?Social Connections: Moderately Integrated  ? Frequency of Communication with Friends and Family: More than three times a week  ? Frequency of Social Gatherings with Friends and Family: More than three times a week  ? Attends Religious Services: More than 4 times per year  ? Active Member of Clubs or Organizations: No  ? Attends Archivist Meetings: Never  ? Marital Status: Married  ?Intimate Partner Violence: Not At Risk  ? Fear of Current or Ex-Partner: No  ? Emotionally Abused: No  ? Physically Abused: No  ? Sexually Abused: No  ? ? ? ?PHYSICAL EXAM ? ?GENERAL EXAM/CONSTITUTIONAL: ?Vitals:  ?Vitals:  ? 07/17/21 1404  ?BP: 128/82  ?Pulse: 75  ?Weight: 152 lb (68.9 kg)  ?Height: '5\' 7"'$  (1.702 m)  ? ?Body mass index is 23.81 kg/m?. ?Wt Readings from Last 3 Encounters:  ?07/17/21 152 lb (68.9 kg)  ?06/23/21 153 lb (69.4 kg)  ?05/10/21 154 lb 8 oz (70.1 kg)  ? ?Patient is in no distress; well developed, nourished and groomed; neck is supple ? ?CARDIOVASCULAR: ?Examination of carotid arteries is  normal; no carotid bruits ?Regular rate and rhythm, no murmurs ?Examination of peripheral vascular system by observation and palpation is normal ? ?EYES: ?Ophthalmoscopic exam of optic discs and posterio

## 2021-07-17 NOTE — Patient Instructions (Signed)
?  MILD MEMORY LOSS ?- likely mild cognitive impairment (vs mild dementia) ?- continue donepezil ?- safety / supervision issues reviewed ?- daily physical activity / exercise (at least 15-30 minutes) ?- eat more plants / vegetables ?- increase social activities, brain stimulation, games, puzzles, hobbies, crafts, arts, music ?- aim for at least 7-8 hours sleep per night (or more) ?- avoid smoking and alcohol ?- caregiver resources provided ?- caution with medications, finances, driving ?

## 2021-07-18 DIAGNOSIS — N183 Chronic kidney disease, stage 3 unspecified: Secondary | ICD-10-CM | POA: Diagnosis not present

## 2021-07-18 DIAGNOSIS — M25512 Pain in left shoulder: Secondary | ICD-10-CM | POA: Diagnosis not present

## 2021-08-04 DIAGNOSIS — M25512 Pain in left shoulder: Secondary | ICD-10-CM | POA: Diagnosis not present

## 2021-08-07 DIAGNOSIS — M25512 Pain in left shoulder: Secondary | ICD-10-CM | POA: Diagnosis not present

## 2021-08-14 DIAGNOSIS — M25812 Other specified joint disorders, left shoulder: Secondary | ICD-10-CM | POA: Diagnosis not present

## 2021-08-16 ENCOUNTER — Telehealth: Payer: Self-pay

## 2021-08-16 NOTE — Telephone Encounter (Signed)
? ?  Pre-operative Risk Assessment  ?  ?Patient Name: Miguel Vazquez  ?DOB: [birth year] ?MRN: [id number]  ? ?  ? ?Request for Surgical Clearance   ? ?Procedure:   LT SHOULDER REVERSE ARTHROPLASTY  ? ?Date of Surgery:  Clearance TBD                              ?   ?Surgeon:  DR. Lennette Bihari SUPPLE  ?Surgeon's Group or Practice Name:  [facility] ?Phone number:  [phone] ?Fax number:  [phone] KELLY HANCOCK  ?  ?Type of Clearance Requested:   ?- Medical  ?  ?Type of Anesthesia:  General  ?  ?Additional requests/questions:   ? ?Signed, ?Ario Mcdiarmid L Brody Bonneau   ?08/16/2021, 4:52 PM   ?

## 2021-08-17 NOTE — Telephone Encounter (Signed)
   Patient Name: Miguel Vazquez  DOB: Apr 15, 1940 MRN: [id number]  Primary Cardiologist: Peter Martinique, MD  Chart reviewed as part of pre-operative protocol coverage.   The patient already has an upcoming appointment scheduled 09/04/21 with Dr. Martinique at which time this clearance should be addressed, as this is to follow up an echocardiogram scheduled 08/22/21 - previous visit 03/2021 recommended for possible medication titration based on that echo. Patient also appears to be seeing PCP 08/29/21 for preop eval as well. It is not explicitly stated on intake that patient needs to hold aspirin below, MD can comment on continuation in pre-op note.  - I added "preop" comment to appointment notes so that provider is aware to address at time of OV. Per office protocol, the provider seeing this patient should forward their finalized clearance decision to requesting party below.  - Will fax update to requesting surgeon so they are aware. Will remove from preop box as separate preop APP input not necessary at this time.  Charlie Pitter, PA-C 08/17/2021, 4:10 PM

## 2021-08-22 ENCOUNTER — Other Ambulatory Visit (HOSPITAL_COMMUNITY): Payer: PPO

## 2021-08-22 ENCOUNTER — Ambulatory Visit (INDEPENDENT_AMBULATORY_CARE_PROVIDER_SITE_OTHER): Payer: PPO

## 2021-08-22 DIAGNOSIS — Z951 Presence of aortocoronary bypass graft: Secondary | ICD-10-CM | POA: Diagnosis not present

## 2021-08-22 DIAGNOSIS — I350 Nonrheumatic aortic (valve) stenosis: Secondary | ICD-10-CM | POA: Diagnosis not present

## 2021-08-22 DIAGNOSIS — I255 Ischemic cardiomyopathy: Secondary | ICD-10-CM

## 2021-08-22 LAB — ECHOCARDIOGRAM COMPLETE
AR max vel: 1.33 cm2
AV Area VTI: 1.57 cm2
AV Area mean vel: 1.41 cm2
AV Mean grad: 8 mmHg
AV Peak grad: 15.7 mmHg
Ao pk vel: 1.98 m/s
Area-P 1/2: 1.85 cm2
Single Plane A4C EF: 57.7 %

## 2021-08-23 ENCOUNTER — Telehealth (HOSPITAL_BASED_OUTPATIENT_CLINIC_OR_DEPARTMENT_OTHER): Payer: Self-pay

## 2021-08-23 NOTE — Telephone Encounter (Addendum)
Results called to patient who verbalizes understanding!     ----- Message from Loel Dubonnet, NP sent at 08/22/2021  5:01 PM EDT ----- Echo with low normal heart pumping function. Wall motion abnormalities similar compared to previous. Trivial leaking of the mitral valve. Mild stenosis of the aortic valve.   EF remains unchanged from 2017. Aortic stenosis remains mild. Follow up as scheduled, continue current medications.

## 2021-08-29 ENCOUNTER — Ambulatory Visit (INDEPENDENT_AMBULATORY_CARE_PROVIDER_SITE_OTHER): Payer: PPO | Admitting: Family Medicine

## 2021-08-29 ENCOUNTER — Encounter: Payer: Self-pay | Admitting: Family Medicine

## 2021-08-29 VITALS — BP 108/60 | HR 65 | Temp 97.7°F | Ht 67.0 in | Wt 154.4 lb

## 2021-08-29 DIAGNOSIS — E1121 Type 2 diabetes mellitus with diabetic nephropathy: Secondary | ICD-10-CM | POA: Diagnosis not present

## 2021-08-29 DIAGNOSIS — I10 Essential (primary) hypertension: Secondary | ICD-10-CM | POA: Diagnosis not present

## 2021-08-29 DIAGNOSIS — I25810 Atherosclerosis of coronary artery bypass graft(s) without angina pectoris: Secondary | ICD-10-CM | POA: Diagnosis not present

## 2021-08-29 LAB — POCT GLYCOSYLATED HEMOGLOBIN (HGB A1C): Hemoglobin A1C: 6.4 % — AB (ref 4.0–5.6)

## 2021-08-29 NOTE — Patient Instructions (Signed)
A1C is 6.4% which is stable  We will complete clearance forms for surgery.

## 2021-08-29 NOTE — Progress Notes (Signed)
Established Patient Office Visit  Subjective   Patient ID: Miguel Vazquez, male    DOB: Jan 10, 1941  Age: 81 y.o. MRN: [id number]  Chief Complaint  Patient presents with   Pre-op Exam    HPI   Here for preoperative medical evaluation in anticipation of left rotator cuff repair.  Patient has multiple medical problems including history of hypertension, type 2 diabetes, CAD, hypothyroidism, hyperlipidemia  He has history of ST elevation MI 03/18/2015 and CABG x3  12/16-16.  Has pending follow-up with cardiology on 5 June.  He had recent echo just a week ago with ejection fraction 45 to 50%..  He had some wall motion abnormalities similar to prior echo.  Trivial regurgitation mitral valve and mild stenosis of aortic valve.  Ejection fraction unchanged from 2017.  His blood pressure and diabetes have been well controlled.  A1c's have consistently been low 6 range.  He is a non-smoker.  Denies any recent chest pains.  No dyspnea but fairly sedentary.  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   Diabetes mellitus    Diverticulitis    Gait abnormality 01/11/2021   GERD (gastroesophageal reflux disease)    Hx of abdominal aortic aneurysm 1998   Hyperlipidemia    Hypertension    Lower GI bleed    Memory loss    Myocardial infarction [facility])    2016   Personal history of colonic polyps - adenomas 09/09/2013   Past Surgical History:  Procedure Laterality Date   ABDOMINAL AORTIC ANEURYSM REPAIR  1998   CARDIAC CATHETERIZATION N/A 03/18/2015   Procedure: Left Heart Cath and Coronary Angiography;  Surgeon: Peter M Martinique, MD;  Location: [facility];  Service: Cardiovascular;  Laterality: N/A;   CATARACT EXTRACTION Right    COLONOSCOPY     CORONARY ARTERY BYPASS GRAFT N/A  03/18/2015   Procedure: CORONARY ARTERY BYPASS GRAFTING (CABG) x  three, using left internal mammary artery and right leg greater saphenous vein harvested endoscopically;  Surgeon: Melrose Nakayama, MD;  Location: [facility];  Service: Open Heart Surgery;  Laterality: N/A;   INGUINAL HERNIA REPAIR Bilateral    INTRAOPERATIVE TRANSESOPHAGEAL ECHOCARDIOGRAM N/A 03/18/2015   Procedure: INTRAOPERATIVE TRANSESOPHAGEAL ECHOCARDIOGRAM;  Surgeon: Melrose Nakayama, MD;  Location: [facility];  Service: Open Heart Surgery;  Laterality: N/A;   [place]    reports that he quit smoking about 35 years ago. His smoking use included cigarettes. He has a 30.00 pack-year smoking history. He has never used smokeless tobacco. He reports that he does not currently use alcohol. He reports that he does not use drugs. family history includes Bone cancer in his mother; Diabetes in his paternal aunt; Down syndrome in his son; Stroke in his son. Allergies  Allergen Reactions   Food Anaphylaxis    TREE NUTS   Shellfish Allergy Anaphylaxis   Prednisone     "psychosis"   Tramadol Other (See Comments)    hallucinations   Venomil Honey Bee Venom [Honey Bee Venom] Swelling    Localized swelling    Review of Systems  Respiratory:  Negative for shortness of breath.   Cardiovascular:  Negative for chest pain, palpitations and leg swelling.  Gastrointestinal:  Negative for abdominal pain.  Genitourinary:  Negative for dysuria.  Neurological:  Negative for dizziness, speech change and focal weakness.     Objective:     BP 108/60 (BP Location: Left Arm, Patient Position: Sitting, Cuff Size: Normal)   Pulse 65   Temp 97.7 F (36.5 C) (Oral)   Ht '5\' 7"'$  (1.702 m)   Wt 154 lb 6.4 oz (70 kg)   SpO2 98%   BMI 24.18 kg/m    Physical Exam Vitals reviewed.  Constitutional:      Appearance: Normal appearance.  Cardiovascular:     Rate and Rhythm: Normal rate.  Pulmonary:     Effort: Pulmonary  effort is normal.     Breath sounds: Normal breath sounds.  Musculoskeletal:     Right lower leg: No edema.     Left lower leg: No edema.  Skin:    Comments: Feet reveal no skin lesions.  Good capillary refill bilaterally.  Good distal pulses.  Neurological:     General: No focal deficit present.     Mental Status: He is alert.     Results for orders placed or performed in visit on 08/29/21  POCT glycosylated hemoglobin (Hb A1C)  Result Value Ref Range   Hemoglobin A1C 6.4 (A) 4.0 - 5.6 %   HbA1c POC (<> result, manual entry)     HbA1c, POC (prediabetic range)     HbA1c, POC (controlled diabetic range)        The ASCVD Risk score (Arnett DK, et al., 2019) failed to calculate for the following reasons:   The patient has a prior MI or stroke diagnosis    Assessment & Plan:   Problem List Items Addressed This Visit       Unprioritized   Type 2 diabetes mellitus with diabetic nephropathy, without long-term current use of insulin ([facility]) - Primary   Relevant Orders   POCT glycosylated hemoglobin (Hb A1C) (Completed)  Patient here for presurgical clearance.  A1c stable at 6.4%.  He does have history of systolic impairment with ejection fraction 45 to 50% on recent echo but overall stable.  No medical contraindications for surgery and will be evaluated by cardiology early next week  His blood pressure is well controlled with losartan and carvedilol.  Continue current medications and recommend routine follow-up in 3 months  No follow-ups on file.    Carolann Littler, MD

## 2021-08-30 NOTE — Progress Notes (Signed)
09/04/2021 DARYN HICKS   1941/02/24  [id number]  Primary Physician [name], [name], MD Primary Cardiologist: Dr Oley Lahaie Martinique  HPI:  81 y/o male seen for follow up CAD. He also needs preop Clearance for rotator cuff surgery. He has a history of DM, HTN, CRI, and PVD s/p remote AAA R&G, admitted 03/18/15 with a STEMI. At cath he had severe 3V and left main CAD. EF 20--25%. He went for urgent CABG. He tolerated this well. Post op he had PAF and was placed on Amiodarone. Amiodarone has since been discontinued. Echo in January 2017 showed EF 35-40%. He was placed on lisinopril but developed hyperkalemia so this was stopped. On his follow up we resumed at a lower dose and potassium levels have been normal.   He was admitted in March 2021 with lower GI bleed due to diverticulitis and diverticular bleed. Treated with antibiotics.   Was seen by Laurann Montana PA-C in May. Doing well at that time. Echocardiogram 07/08/2020 with hypokinesis of the inferobasal and dyskinesis of basal anterolateral wall, LVEF 40 to 45%, mild aortic stenosis. Evaluated by Neurology with gait disturbance. MRI showed cortical atrophy. EMG c/w axonal polyneuropathy.   Echo done recently was unchanged from prior with EF 45-50%  On follow up today he is doing well from a cardiac standpoint. Denies any chest pain or SOB.  No edema or palpitations. He did fall about 5 weeks ago and injured his left shoulder. Has been evaluated by Dr Onnie Graham and surgery recommended. Prior to his fall he was active and noted no limitations.    Current Outpatient Medications  Medication Sig Dispense Refill   aspirin EC 81 MG tablet Take 1 tablet (81 mg total) by mouth daily. 90 tablet 3   atorvastatin (LIPITOR) 80 MG tablet TAKE 1 TABLET BY MOUTH DAILY AT 6 PM. 90 tablet 2   carvedilol (COREG) 6.25 MG tablet TAKE 1 TABLET BY MOUTH TWICE A DAY 180 tablet 3   diazepam (VALIUM) 10 MG tablet Take by mouth as needed.     donepezil (ARICEPT) 10 MG  tablet TAKE 1 TABLET BY MOUTH EVERYDAY AT BEDTIME 90 tablet 3   EPINEPHrine 0.3 mg/0.3 mL IJ SOAJ injection Inject 0.3 mLs (0.3 mg total) into the muscle as needed for anaphylaxis. 2 each 3   escitalopram (LEXAPRO) 10 MG tablet TAKE 1 TABLET BY MOUTH EVERY DAY 90 tablet 0   ferrous sulfate 324 MG TBEC Take 324 mg by mouth.     glucose blood (FREESTYLE LITE) test strip Check Blood Sugars 1-2 times per day. DX: E11.9. 100 each 5   Lancets (FREESTYLE) lancets Check Blood Sugars 1-2 times per day. DX: E11.9 100 each 5   levothyroxine (SYNTHROID) 75 MCG tablet TAKE 1 TABLET BY MOUTH EVERY DAY 90 tablet 0   losartan (COZAAR) 50 MG tablet TAKE 1 TABLET BY MOUTH EVERY DAY 90 tablet 3   metFORMIN (GLUCOPHAGE) 1000 MG tablet TAKE 1 TABLET BY MOUTH TWICE A DAY 180 tablet 3   Multiple Vitamin (MULTIVITAMIN) tablet Take 1 tablet by mouth daily. Centrium Silver once daily     vitamin B-12 (CYANOCOBALAMIN) 1000 MCG tablet Take 1,000 mcg by mouth daily.     vitamin C (ASCORBIC ACID) 500 MG tablet Take 500 mg by mouth every evening.      No current facility-administered medications for this visit.    Allergies  Allergen Reactions   Food Anaphylaxis    TREE NUTS   Shellfish Allergy Anaphylaxis  Prednisone     "psychosis"   Tramadol Other (See Comments)    hallucinations   Venomil Honey Bee Venom [Honey Bee Venom] Swelling    Localized swelling    Social History   Socioeconomic History   Marital status: Married    Spouse name: [name]   Number of children: 3   Years of education: 10   Highest education level: 10th grade  Occupational History   Not on file  Tobacco Use   Smoking status: Former    Packs/day: 1.50    Years: 20.00    Pack years: 30.00    Types: Cigarettes    Quit date: 08/24/1986    Years since quitting: 35.0   Smokeless tobacco: Never  Vaping Use   Vaping Use: Never used  Substance and Sexual Activity   Alcohol use: Not Currently    Comment: occ   Drug use: No   Sexual  activity: Not on file  Other Topics Concern   Not on file  Social History Narrative   07/17/21 Married and lives with wife in 44 story home.  Retired.      Adult son with Down syndrome died in 06/01/2018.  2 other children living.   Social Determinants of Health   Financial Resource Strain: Low Risk    Difficulty of Paying Living Expenses: Not hard at all  Food Insecurity: No Food Insecurity   Worried About Charity fundraiser in the Last Year: Never true   [place] in the Last Year: Never true  Transportation Needs: No Transportation Needs   Lack of Transportation (Medical): No   Lack of Transportation (Non-Medical): No  Physical Activity: Inactive   Days of Exercise per Week: 0 days   Minutes of Exercise per Session: 0 min  Stress: No Stress Concern Present   Feeling of Stress : Not at all  Social Connections: Moderately Integrated   Frequency of Communication with Friends and Family: More than three times a week   Frequency of Social Gatherings with Friends and Family: More than three times a week   Attends Religious Services: More than 4 times per year   Active Member of [employer] or Organizations: No   Attends Archivist Meetings: Never   Marital Status: Married  Human resources officer Violence: Not At Risk   Fear of Current or Ex-Partner: No   Emotionally Abused: No   Physically Abused: No   Sexually Abused: No     Review of Systems: As noted in HPI All other systems reviewed and are otherwise negative except as noted above.    Blood pressure 128/60, pulse 64, height _0  (1.702 m), weight 155 lb (70.3 kg), SpO2 96 %.  GENERAL:  Well appearing pale WM in NAD HEENT:  PERRL, EOMI, sclera are clear. Oropharynx is clear. NECK:  No jugular venous distention, carotid upstroke brisk and symmetric, no bruits, no thyromegaly or adenopathy LUNGS:  Clear to auscultation bilaterally CHEST:  Unremarkable HEART:  RRR,  PMI not displaced or sustained,S1 and S2 within normal  limits, no S3, no S4: no clicks, no rubs, soft 4-1/6 systolic murmur LSB ABD:  Soft, nontender. BS +, no masses or bruits. No hepatomegaly, no splenomegaly EXT:  2 + pulses throughout, no edema, no cyanosis no clubbing SKIN:  Warm and dry.  No rashes NEURO:  Alert and oriented x 3. Cranial nerves II through XII intact. PSYCH:  Cognitively intact   EKG is done today. NSR rate 64.  Possible  old inferior infarct. No acute change. .I have personally reviewed and interpreted this study.   Lab Results  Component Value Date   WBC 9.2 05/27/2020   HGB 14.2 05/27/2020   HCT 42.5 05/27/2020   PLT 176 05/27/2020   GLUCOSE 159 (H) 05/10/2021   CHOL 126 01/03/2021   TRIG 141.0 01/03/2021   HDL 45.90 01/03/2021   LDLDIRECT 84.1 05/21/2013   LDLCALC 52 01/03/2021   ALT 19 01/03/2021   AST 20 01/03/2021   NA 141 05/10/2021   K 4.4 05/10/2021   CL 100 05/10/2021   CREATININE 1.29 05/10/2021   BUN 27 (H) 05/10/2021   CO2 31 05/10/2021   TSH 1.76 05/10/2021   INR 1.63 (H) 03/18/2015   HGBA1C 6.4 (A) 08/29/2021   MICROALBUR 6.8 (H) 11/21/2009   Echo: 12/13/15: Study Conclusions   - Left ventricle: The cavity size was normal. Wall thickness was   increased in a pattern of mild LVH. Systolic function was mildly   to moderately reduced. The estimated ejection fraction was in the   range of 40% to 45%. Diffuse hypokinesis. There is dyskinesis of   the basalinferolateral myocardium. Doppler parameters are   consistent with abnormal left ventricular relaxation (grade 1   diastolic dysfunction). - Mitral valve: Calcified annulus. - Left atrium: The atrium was mildly dilated. - Pulmonary arteries: Systolic pressure was mildly increased. PA   peak pressure: 32 mm Hg (S).   Impressions:   - Global hypokinesis and dyskinesis of the basal inferior lateral   wall; overall mild to moderate LV dysfunction; grade 1 diastolic   dysfunction; mild LAE; mild TR with mildly elevated pulmonary    pressure.   Echo 07/08/2020  1. Hypokinesis of the inferobasal wall and dyskinesis of the basal  inferolateral wall; overall mild to moderate LV dysfunction; mild AS.   2. Left ventricular ejection fraction, by estimation, is 40 to 45%. The  left ventricle has mild to moderately decreased function. The left  ventricle demonstrates regional wall motion abnormalities (see scoring  diagram/findings for description). There is   mild left ventricular hypertrophy. Left ventricular diastolic parameters  are consistent with Grade I diastolic dysfunction (impaired relaxation).  The average left ventricular global longitudinal strain is -12.9 %. The  global longitudinal strain is  abnormal.   3. Right ventricular systolic function is normal. The right ventricular  size is normal.   4. The mitral valve is normal in structure. Trivial mitral valve  regurgitation. No evidence of mitral stenosis.   5. The aortic valve is tricuspid. Aortic valve regurgitation is not  visualized. Mild aortic valve stenosis.   Echo 08/22/21: IMPRESSIONS     1. Left ventricular ejection fraction, by estimation, is 45 to 50%. The  left ventricle has mildly decreased function. The left ventricle  demonstrates regional wall motion abnormalities (see scoring  diagram/findings for description). Left ventricular  diastolic parameters are consistent with Grade I diastolic dysfunction  (impaired relaxation). There is severe hypokinesis of the left  ventricular, basal-mid lateral wall, inferolateral wall and inferior wall,  with mild dyskinesis in the basal  inferolateral wall.   2. Right ventricular systolic function is mildly reduced. The right  ventricular size is normal.   3. The mitral valve is normal in structure. Trivial mitral valve  regurgitation. No evidence of mitral stenosis. Moderate mitral annular  calcification.   4. The aortic valve is tricuspid. There is moderate calcification of the  aortic valve. There is  moderate thickening of the  aortic valve. Aortic  valve regurgitation is not visualized. Mild aortic valve stenosis.   Comparison(s): No significant change from prior study. Prior images  reviewed side by side.   ASSESSMENT AND PLAN:  1. CAD s/p STEMI in December 2016. Urgent CABG for severe 3 vessel and left main disease. He is asymptomatic. Was functioning at 7 met level prior to recent fall. Recommend continue ASA to 81 mg daily, continue beta blocker, and statin. I think his CV risk is reasonable to proceed with rotator cuff surgery.   2. Ischemic cardiomyopathy. EF improved to 45-50%. Well compensated. Will continue Coreg  6.25 mg bid and losartan.  3. Hyperlipidemia. On lipitor. Excellent control. LDL 52.  4. Post op Afib. Resolved. No recurrence  5. DM type 2 with renal manifestations. Last A1c 6.0%. 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 Last creatinine 1.15.   7. S/p AAA repair.- remote  8. Diverticular bleed with anemia. No recurrence.  9. Mild aortic stenosis- no change on Echo.   I will follow up in one year.    Tzion Wedel Martinique MD,FACC  09/04/2021 2:35 PM

## 2021-09-04 ENCOUNTER — Ambulatory Visit: Payer: PPO | Admitting: Cardiology

## 2021-09-04 ENCOUNTER — Encounter: Payer: Self-pay | Admitting: Cardiology

## 2021-09-04 VITALS — BP 128/60 | HR 64 | Ht 67.0 in | Wt 155.0 lb

## 2021-09-04 DIAGNOSIS — Z951 Presence of aortocoronary bypass graft: Secondary | ICD-10-CM | POA: Diagnosis not present

## 2021-09-04 DIAGNOSIS — I25118 Atherosclerotic heart disease of native coronary artery with other forms of angina pectoris: Secondary | ICD-10-CM

## 2021-09-04 DIAGNOSIS — E785 Hyperlipidemia, unspecified: Secondary | ICD-10-CM

## 2021-09-04 DIAGNOSIS — I350 Nonrheumatic aortic (valve) stenosis: Secondary | ICD-10-CM

## 2021-09-04 DIAGNOSIS — I10 Essential (primary) hypertension: Secondary | ICD-10-CM

## 2021-09-04 NOTE — Patient Instructions (Signed)
Medication Instructions:  Your physician recommends that you continue on your current medications as directed. Please refer to the Current Medication list given to you today.  *If you need a refill on your cardiac medications before your next appointment, please call your pharmacy*   Follow-Up: At [facility], you and your health needs are our priority.  As part of our continuing mission to provide you with exceptional heart care, we have created designated Provider Care Teams.  These Care Teams include your primary Cardiologist (physician) and Advanced Practice Providers (APPs -  Physician Assistants and Nurse Practitioners) who all work together to provide you with the care you need, when you need it.  We recommend signing up for the patient portal called "MyChart".  Sign up information is provided on this After Visit Summary.  MyChart is used to connect with patients for Virtual Visits (Telemedicine).  Patients are able to view lab/test results, encounter notes, upcoming appointments, etc.  Non-urgent messages can be sent to your provider as well.   To learn more about what you can do with MyChart, go to [email or web address].    Your next appointment:   12 month(s)  The format for your next appointment:   In Person  Provider:   Peter Martinique, MD

## 2021-09-09 ENCOUNTER — Other Ambulatory Visit: Payer: Self-pay | Admitting: Family Medicine

## 2021-09-09 DIAGNOSIS — E1121 Type 2 diabetes mellitus with diabetic nephropathy: Secondary | ICD-10-CM

## 2021-09-11 NOTE — Patient Instructions (Signed)
DUE TO COVID-19 ONLY TWO VISITORS  (aged 80 and older)  ARE ALLOWED TO COME WITH YOU AND STAY IN THE WAITING ROOM ONLY DURING PRE OP AND PROCEDURE.   **NO VISITORS ARE ALLOWED IN THE SHORT STAY AREA OR RECOVERY ROOM!!**  IF YOU WILL BE ADMITTED INTO THE HOSPITAL YOU ARE ALLOWED ONLY FOUR SUPPORT PEOPLE DURING VISITATION HOURS ONLY (7 AM -8PM)   The support person(s) must pass our screening, gel in and out, and wear a mask at all times, including in the patient's room. Patients must also wear a mask when staff or their support person are in the room. Visitors GUEST BADGE MUST BE WORN VISIBLY  One adult visitor may remain with you overnight and MUST be in the room by 8 P.M.     Your procedure is scheduled on: 09/21/21   Report to [facility] Main Entrance    Report to admitting at: 10:15 AM   Call this number if you have problems the morning of surgery [phone]   Do not eat food :After Midnight.   After Midnight you may have the following liquids until : 10:00 AM DAY OF SURGERY  Water Black Coffee (sugar ok, NO MILK/CREAM OR CREAMERS)  Tea (sugar ok, NO MILK/CREAM OR CREAMERS) regular and decaf                             Plain Jell-O (NO RED)                                           Fruit ices (not with fruit pulp, NO RED)                                     Popsicles (NO RED)                                                                  Juice: apple, WHITE grape, WHITE cranberry Sports drinks like Gatorade (NO RED) Clear broth(vegetable,chicken,beef)   Drink G2 drink AT :10:00 AM the day of surgery.       The day of surgery:  Drink ONE (1) Pre-Surgery Clear Ensure or G2 at AM the morning of surgery. Drink in one sitting. Do not sip.  This drink was given to you during your hospital  pre-op appointment visit. Nothing else to drink after completing the  Pre-Surgery Clear Ensure or G2.          If you have questions, please contact your surgeon's office.     Oral Hygiene is also important to reduce your risk of infection.                                    Remember - BRUSH YOUR TEETH THE MORNING OF SURGERY WITH YOUR REGULAR TOOTHPASTE   Do NOT smoke after Midnight   Take these medicines the morning of surgery with A SIP OF WATER: escitalopram,carvedilol,synthroid.  DO NOT TAKE ANY  ORAL DIABETIC MEDICATIONS DAY OF YOUR SURGERY  Bring CPAP mask and tubing day of surgery.                              You may not have any metal on your body including hair pins, jewelry, and body piercing             Do not wear lotions, powders, perfumes/cologne, or deodorant              Men may shave face and neck.   Do not bring valuables to the hospital. [place].   Contacts, dentures or bridgework may not be worn into surgery.   Bring small overnight bag day of surgery.   DO NOT [place]. PHARMACY WILL DISPENSE MEDICATIONS LISTED ON YOUR MEDICATION LIST TO YOU DURING YOUR ADMISSION [place]!    Patients discharged on the day of surgery will not be allowed to drive home.  Someone NEEDS to stay with you for the first 24 hours after anesthesia.   Special Instructions: Bring a copy of your healthcare power of attorney and living will documents         the day of surgery if you haven't scanned them before.              Please read over the following fact sheets you were given: IF YOU HAVE QUESTIONS ABOUT YOUR PRE-OP INSTRUCTIONS PLEASE CALL [phone]     [facility] - Preparing for Surgery Before surgery, you can play an important role.  Because skin is not sterile, your skin needs to be as free of germs as possible.  You can reduce the number of germs on your skin by washing with CHG (chlorahexidine gluconate) soap before surgery.  CHG is an antiseptic cleaner which kills germs and bonds with the skin to continue killing germs even after washing. Please DO NOT  use if you have an allergy to CHG or antibacterial soaps.  If your skin becomes reddened/irritated stop using the CHG and inform your nurse when you arrive at Short Stay. Do not shave (including legs and underarms) for at least 48 hours prior to the first CHG shower.  You may shave your face/neck. Please follow these instructions carefully:  1.  Shower with CHG Soap the night before surgery and the  morning of Surgery.  2.  If you choose to wash your hair, wash your hair first as usual with your  normal  shampoo.  3.  After you shampoo, rinse your hair and body thoroughly to remove the  shampoo.                           4.  Use CHG as you would any other liquid soap.  You can apply chg directly  to the skin and wash                       Gently with a scrungie or clean washcloth.  5.  Apply the CHG Soap to your body ONLY FROM THE NECK DOWN.   Do not use on face/ open  Wound or open sores. Avoid contact with eyes, ears mouth and genitals (private parts).                       Wash face,  Genitals (private parts) with your normal soap.             6.  Wash thoroughly, paying special attention to the area where your surgery  will be performed.  7.  Thoroughly rinse your body with warm water from the neck down.  8.  DO NOT shower/wash with your normal soap after using and rinsing off  the CHG Soap.                9.  Pat yourself dry with a clean towel.            10.  Wear clean pajamas.            11.  Place clean sheets on your bed the night of your first shower and do not  sleep with pets. Day of Surgery : Do not apply any lotions/deodorants the morning of surgery.  Please wear clean clothes to the hospital/surgery center.  FAILURE TO FOLLOW THESE INSTRUCTIONS MAY RESULT IN THE CANCELLATION OF YOUR SURGERY PATIENT SIGNATURE_________________________________  NURSE  SIGNATURE__________________________________  ________________________________________________________________________ [facility]- Preparing for Total Shoulder Arthroplasty    Before surgery, you can play an important role. Because skin is not sterile, your skin needs to be as free of germs as possible. You can reduce the number of germs on your skin by using the following products. Benzoyl Peroxide Gel Reduces the number of germs present on the skin Applied twice a day to shoulder area starting two days before surgery    ==================================================================  Please follow these instructions carefully:  BENZOYL PEROXIDE 5% GEL  Please do not use if you have an allergy to benzoyl peroxide.   If your skin becomes reddened/irritated stop using the benzoyl peroxide.  Starting two days before surgery, apply as follows: Apply benzoyl peroxide in the morning and at night. Apply after taking a shower. If you are not taking a shower clean entire shoulder front, back, and side along with the armpit with a clean wet washcloth.  Place a quarter-sized dollop on your shoulder and rub in thoroughly, making sure to cover the front, back, and side of your shoulder, along with the armpit.   2 days before ____ AM   ____ PM              1 day before ____ AM   ____ PM                         Do this twice a day for two days.  (Last application is the night before surgery, AFTER using the CHG soap as described below).  Do NOT apply benzoyl peroxide gel on the day of surgery.   Incentive Spirometer  An incentive spirometer is a tool that can help keep your lungs clear and active. This tool measures how well you are filling your lungs with each breath. Taking long deep breaths may help reverse or decrease the chance of developing breathing (pulmonary) problems (especially infection) following: A long period of time when you are unable to move or be active. BEFORE THE PROCEDURE   If the spirometer includes an indicator to show your best effort, your nurse or respiratory therapist will set it to a desired goal. If possible,  sit up straight or lean slightly forward. Try not to slouch. Hold the incentive spirometer in an upright position. INSTRUCTIONS FOR USE  Sit on the edge of your bed if possible, or sit up as far as you can in bed or on a chair. Hold the incentive spirometer in an upright position. Breathe out normally. Place the mouthpiece in your mouth and seal your lips tightly around it. Breathe in slowly and as deeply as possible, raising the piston or the ball toward the top of the column. Hold your breath for 3-5 seconds or for as long as possible. Allow the piston or ball to fall to the bottom of the column. Remove the mouthpiece from your mouth and breathe out normally. Rest for a few seconds and repeat Steps 1 through 7 at least 10 times every 1-2 hours when you are awake. Take your time and take a few normal breaths between deep breaths. The spirometer may include an indicator to show your best effort. Use the indicator as a goal to work toward during each repetition. After each set of 10 deep breaths, practice coughing to be sure your lungs are clear. If you have an incision (the cut made at the time of surgery), support your incision when coughing by placing a pillow or rolled up towels firmly against it. Once you are able to get out of bed, walk around indoors and cough well. You may stop using the incentive spirometer when instructed by your caregiver.  RISKS AND COMPLICATIONS Take your time so you do not get dizzy or light-headed. If you are in pain, you may need to take or ask for pain medication before doing incentive spirometry. It is harder to take a deep breath if you are having pain. AFTER USE Rest and breathe slowly and easily. It can be helpful to keep track of a log of your progress. Your caregiver can provide you with a simple table to help  with this. If you are using the spirometer at home, follow these instructions: [place] IF:  You are having difficultly using the spirometer. You have trouble using the spirometer as often as instructed. Your pain medication is not giving enough relief while using the spirometer. You develop fever of 100.5 F (38.1 C) or higher. SEEK IMMEDIATE MEDICAL CARE IF:  You cough up bloody sputum that had not been present before. You develop fever of 102 F (38.9 C) or greater. You develop worsening pain at or near the incision site. MAKE SURE YOU:  Understand these instructions. Will watch your condition. Will get help right away if you are not doing well or get worse. Document Released: 07/30/2006 Document Revised: 06/11/2011 Document Reviewed: 09/30/2006 [facility] Patient Information 2014 [place], [place].   ________________________________________________________________________

## 2021-09-12 ENCOUNTER — Encounter (HOSPITAL_COMMUNITY): Payer: Self-pay

## 2021-09-12 ENCOUNTER — Other Ambulatory Visit: Payer: Self-pay

## 2021-09-12 ENCOUNTER — Telehealth: Payer: Self-pay | Admitting: Internal Medicine

## 2021-09-12 ENCOUNTER — Telehealth: Payer: Self-pay

## 2021-09-12 ENCOUNTER — Encounter (HOSPITAL_COMMUNITY)
Admission: RE | Admit: 2021-09-12 | Discharge: 2021-09-12 | Disposition: A | Discharge status: Home / Self Care | Payer: PPO | Source: Ambulatory Visit | Attending: Orthopedic Surgery | Admitting: Orthopedic Surgery

## 2021-09-12 VITALS — BP 99/58 | HR 69 | Temp 97.5°F | Ht 67.0 in | Wt 151.0 lb

## 2021-09-12 DIAGNOSIS — I2510 Atherosclerotic heart disease of native coronary artery without angina pectoris: Secondary | ICD-10-CM | POA: Insufficient documentation

## 2021-09-12 DIAGNOSIS — Z7984 Long term (current) use of oral hypoglycemic drugs: Secondary | ICD-10-CM | POA: Diagnosis not present

## 2021-09-12 DIAGNOSIS — Z951 Presence of aortocoronary bypass graft: Secondary | ICD-10-CM | POA: Insufficient documentation

## 2021-09-12 DIAGNOSIS — E1121 Type 2 diabetes mellitus with diabetic nephropathy: Secondary | ICD-10-CM

## 2021-09-12 DIAGNOSIS — Z01818 Encounter for other preprocedural examination: Secondary | ICD-10-CM

## 2021-09-12 DIAGNOSIS — I252 Old myocardial infarction: Secondary | ICD-10-CM | POA: Insufficient documentation

## 2021-09-12 DIAGNOSIS — M75102 Unspecified rotator cuff tear or rupture of left shoulder, not specified as traumatic: Secondary | ICD-10-CM | POA: Diagnosis not present

## 2021-09-12 DIAGNOSIS — I350 Nonrheumatic aortic (valve) stenosis: Secondary | ICD-10-CM | POA: Diagnosis not present

## 2021-09-12 DIAGNOSIS — I129 Hypertensive chronic kidney disease with stage 1 through stage 4 chronic kidney disease, or unspecified chronic kidney disease: Secondary | ICD-10-CM | POA: Insufficient documentation

## 2021-09-12 DIAGNOSIS — E1122 Type 2 diabetes mellitus with diabetic chronic kidney disease: Secondary | ICD-10-CM | POA: Diagnosis not present

## 2021-09-12 DIAGNOSIS — N183 Chronic kidney disease, stage 3 unspecified: Secondary | ICD-10-CM | POA: Diagnosis not present

## 2021-09-12 DIAGNOSIS — I10 Essential (primary) hypertension: Secondary | ICD-10-CM

## 2021-09-12 DIAGNOSIS — Z01812 Encounter for preprocedural laboratory examination: Secondary | ICD-10-CM | POA: Diagnosis not present

## 2021-09-12 DIAGNOSIS — Z87891 Personal history of nicotine dependence: Secondary | ICD-10-CM | POA: Insufficient documentation

## 2021-09-12 DIAGNOSIS — K862 Cyst of pancreas: Secondary | ICD-10-CM

## 2021-09-12 HISTORY — DX: Personal history of urinary calculi: Z87.442

## 2021-09-12 LAB — BASIC METABOLIC PANEL
Anion gap: 9 (ref 5–15)
BUN: 26 mg/dL — ABNORMAL HIGH (ref 8–23)
CO2: 27 mmol/L (ref 22–32)
Calcium: 9.8 mg/dL (ref 8.9–10.3)
Chloride: 104 mmol/L (ref 98–111)
Creatinine, Ser: 0.99 mg/dL (ref 0.61–1.24)
GFR, Estimated: >60 mL/min (ref >60)
Glucose, Bld: 136 mg/dL — ABNORMAL HIGH (ref 70–99)
Potassium: 4.3 mmol/L (ref 3.5–5.1)
Sodium: 140 mmol/L (ref 135–145)

## 2021-09-12 LAB — CBC
HCT: 40.8 % (ref 39.0–52.0)
Hemoglobin: 13.8 g/dL (ref 13.0–17.0)
MCH: 33.7 pg (ref 26.0–34.0)
MCHC: 33.8 g/dL (ref 30.0–36.0)
MCV: 99.8 fL (ref 80.0–100.0)
Platelets: 177 10*3/uL (ref 150–400)
RBC: 4.09 MIL/uL — ABNORMAL LOW (ref 4.22–5.81)
RDW: 13.5 % (ref 11.5–15.5)
WBC: 9.2 10*3/uL (ref 4.0–10.5)
nRBC: 0 % (ref 0.0–0.2)

## 2021-09-12 LAB — GLUCOSE, CAPILLARY: Glucose-Capillary: 150 mg/dL — ABNORMAL HIGH (ref 70–99)

## 2021-09-12 LAB — SURGICAL PCR SCREEN
MRSA, PCR: NEGATIVE
Staphylococcus aureus: NEGATIVE

## 2021-09-12 NOTE — Telephone Encounter (Signed)
Pt wife [name] stated that pt is supposed to have a reverse shoulder surgery the next day after MRI. [name] was questioning is it [facility] for pt to have the MRI the day before surgery. [name] was notified that she could reach out to  [place]'s surgeon and see what they request. Number to Radiology provided [phone]  to [name] if she does need to reschedule MRI.   [name] verbalized understanding with all questions answered.

## 2021-09-12 NOTE — Progress Notes (Addendum)
For Short Stay: [place] appointment date: Date of COVID positive in last 76 days:  Bowel Prep reminder:   For Anesthesia: PCP - Dr. Carolann Littler. Cardiologist - Dr. Peter Martinique. LOV: 09/04/21 : Clearance: 09/04/21: EPIC  Chest x-ray -  EKG - 09/04/21 Stress Test -  ECHO - 08/22/21 Cardiac Cath -  Pacemaker/ICD device last checked: Pacemaker orders received: Device Rep notified:  Spinal Cord Stimulator:  Sleep Study -  CPAP -   Fasting Blood Sugar - N/A Checks Blood Sugar ___0__ times a day Date and result of last Hgb A1c-6.4: 08/29/21  Blood Thinner Instructions: Aspirin Instructions: No instructions. Last Dose:  Activity level: Can go up a flight of stairs and activities of daily living without stopping and without chest pain and/or shortness of breath   Able to exercise without chest pain and/or shortness of breath   Unable to go up a flight of stairs without chest pain and/or shortness of breath     Anesthesia review: Hx: HTN,DIA,AAA,CAD,MI,CABG  Patient denies shortness of breath, fever, cough and chest pain at PAT appointment   Patient verbalized understanding of instructions that were given to them at the PAT appointment. Patient was also instructed that they will need to review over the PAT instructions again at home before surgery.

## 2021-09-12 NOTE — Telephone Encounter (Signed)
Inbound call from patients wife stating she would like to know if It is okay for him to have the MRI on the 21st and then have a reverse shoulder surgery on the next day. Please advise.

## 2021-09-12 NOTE — Telephone Encounter (Signed)
-----   Message from Gillermina Hu, RN sent at 03/14/2021 12:53 PM EST ----- Regarding: MRI Gatha Mayer, MD  Gillermina Hu, RN Please tell patient's wife (he has memory disturbance) that the cyst is stable  Recall - reminder to repeat MRI in 6 months   Also ask if any breathing problems or chest pain as they raised ? Of possible pulmonary embolism (think not but radiologist not sure) - let me know

## 2021-09-12 NOTE — Telephone Encounter (Signed)
Reminder received  to repeat MRI MRI ordered and scheduled. Pt wife Miguel Vazquez made aware: MRI scheduled for 09/20/2021 at 4:00 PM at [facility]. Pt to arrive at 3:30 PM : No prep instructions:  Miguel Vazquez verbalized understanding with all questions answered.

## 2021-09-13 NOTE — Anesthesia Preprocedure Evaluation (Addendum)
Anesthesia Evaluation  Patient identified by MRN, date of birth, ID band Patient awake    Reviewed: Allergy & Precautions, NPO status , Patient's Chart, lab work & pertinent test results, reviewed documented beta blocker date and time   Airway Mallampati: II  TM Distance: >3 FB Neck ROM: Full    Dental  (+) Teeth Intact, Dental Advisory Given, Caps,    Pulmonary former smoker,    Pulmonary exam normal breath sounds clear to auscultation       Cardiovascular hypertension, Pt. on home beta blockers and Pt. on medications + CAD, + Past MI, + CABG and + Peripheral Vascular Disease (Hx of abdominal aortic aneurysm s/p repair)  Normal cardiovascular exam Rhythm:Regular Rate:Normal  Echo 08/22/21: 1. Left ventricular ejection fraction, by estimation, is 45 to 50%. The  left ventricle has mildly decreased function. The left ventricle  demonstrates regional wall motion abnormalities (see scoring  diagram/findings for description). Left ventricular  diastolic parameters are consistent with Grade I diastolic dysfunction  (impaired relaxation). There is severe hypokinesis of the left  ventricular, basal-mid lateral wall, inferolateral wall and inferior wall,  with mild dyskinesis in the basal  inferolateral wall.  2. Right ventricular systolic function is mildly reduced. The right  ventricular size is normal.  3. The mitral valve is normal in structure. Trivial mitral valve  regurgitation. No evidence of mitral stenosis. Moderate mitral annular  calcification.  4. The aortic valve is tricuspid. There is moderate calcification of the  aortic valve. There is moderate thickening of the aortic valve. Aortic  valve regurgitation is not visualized. Mild aortic valve stenosis.    Neuro/Psych  Headaches, PSYCHIATRIC DISORDERS Depression Brain aneurysm    GI/Hepatic Neg liver ROS, GERD  ,  Endo/Other  diabetes, Type 2, Oral Hypoglycemic  AgentsHypothyroidism   Renal/GU Renal disease     Musculoskeletal  (+) Arthritis , Left shoulder rotator cuff tear arthropathy   Abdominal   Peds  Hematology negative hematology ROS (+)   Anesthesia Other Findings Day of surgery medications reviewed with the patient.  Reproductive/Obstetrics                           Anesthesia Physical Anesthesia Plan  ASA: 3  Anesthesia Plan: General   Post-op Pain Management: Regional block* and Tylenol PO (pre-op)*   Induction: Intravenous  PONV Risk Score and Plan: 2 and Dexamethasone and Ondansetron  Airway Management Planned: Oral ETT  Additional Equipment:   Intra-op Plan:   Post-operative Plan: Extubation in OR  Informed Consent: I have reviewed the patients History and Physical, chart, labs and discussed the procedure including the risks, benefits and alternatives for the proposed anesthesia with the patient or authorized representative who has indicated his/her understanding and acceptance.     Dental advisory given  Plan Discussed with: CRNA  Anesthesia Plan Comments: (See PAT note 09/12/2021)       Anesthesia Quick Evaluation

## 2021-09-13 NOTE — Progress Notes (Signed)
Anesthesia Chart Review   Case: [id number] Date/Time: 09/21/21 1250   Procedure: REVERSE SHOULDER ARTHROPLASTY (Left: Shoulder) - 123mn   Anesthesia type: General   Pre-op diagnosis: Left shoulder rotator cuff tear arthropathy   Location: [facility] ROOM 06 / [facility]   Surgeons: SJustice Britain MD       DISCUSSION:80 y.o. former smoker with h/o HTN, DM II, CAD s/p CABG, s/p AAA repair, mild aortic valve stenosis (mean gradient 8.0 mmHg, valve area 1.57 cm2), CKD Stage 2-3, left shoulder rotator cuff tear arthropathy scheduled for above procedure 09/21/2021 with Dr. KJustice Britain   Pt last seen by cardiology 09/04/2021. Per OV note, "CAD s/p STEMI in December 2016. Urgent CABG for severe 3 vessel and left main disease. He is asymptomatic. Was functioning at 7 met level prior to recent fall. Recommend continue ASA to 81 mg daily, continue beta blocker, and statin. I think his CV risk is reasonable to proceed with rotator cuff surgery."  Anticipate pt can proceed with planned procedure barring acute status change.   VS: BP (!) 99/58   Pulse 69   Temp (!) 36.4 C (Oral)   Ht _0  (1.702 m)   Wt 68.5 kg   SpO2 98%   BMI 23.65 kg/m   PROVIDERS: BEulas Post MD is PCP   JMartinique Peter, MD is Cardiologist  LABS: Labs reviewed: Acceptable for surgery. (all labs ordered are listed, but only abnormal results are displayed)  Labs Reviewed  BASIC METABOLIC PANEL - Abnormal; Notable for the following components:      Result Value   Glucose, Bld 136 (*)    BUN 26 (*)    All other components within normal limits  CBC - Abnormal; Notable for the following components:   RBC 4.09 (*)    All other components within normal limits  GLUCOSE, CAPILLARY - Abnormal; Notable for the following components:   Glucose-Capillary 150 (*)    All other components within normal limits  SURGICAL PCR SCREEN     IMAGES:   EKG: 09/04/2021 Rate 64 bpm  NSR RSR' or QR pattern in V1 suggests right ventricular  conduction delay Cannot rule out inferior infarct, age undetermined  CV: Echo 08/22/2021 1. Left ventricular ejection fraction, by estimation, is 45 to 50%. The  left ventricle has mildly decreased function. The left ventricle  demonstrates regional wall motion abnormalities (see scoring  diagram/findings for description). Left ventricular  diastolic parameters are consistent with Grade I diastolic dysfunction  (impaired relaxation). There is severe hypokinesis of the left  ventricular, basal-mid lateral wall, inferolateral wall and inferior wall,  with mild dyskinesis in the basal  inferolateral wall.   2. Right ventricular systolic function is mildly reduced. The right  ventricular size is normal.   3. The mitral valve is normal in structure. Trivial mitral valve  regurgitation. No evidence of mitral stenosis. Moderate mitral annular  calcification.   4. The aortic valve is tricuspid. There is moderate calcification of the  aortic valve. There is moderate thickening of the aortic valve. Aortic  valve regurgitation is not visualized. Mild aortic valve stenosis.  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   Diabetes mellitus    Diverticulitis  Gait abnormality 01/11/2021   GERD (gastroesophageal reflux disease)    History of kidney stones    Hx of abdominal aortic aneurysm 1998   Hyperlipidemia    Hypertension    Lower GI bleed    Memory loss    Myocardial infarction [facility])    2016   Personal history of colonic polyps - adenomas 09/09/2013    Past Surgical History:  Procedure Laterality Date   ABDOMINAL AORTIC ANEURYSM REPAIR  1998   CARDIAC CATHETERIZATION N/A 03/18/2015   Procedure: Left Heart Cath and Coronary Angiography;  Surgeon: Peter M Martinique, MD;  Location:  [place] [facility];  Service: Cardiovascular;  Laterality: N/A;   CATARACT EXTRACTION Right    COLONOSCOPY     CORONARY ARTERY BYPASS GRAFT N/A 03/18/2015   Procedure: CORONARY ARTERY BYPASS GRAFTING (CABG) x  three, using left internal mammary artery and right leg greater saphenous vein harvested endoscopically;  Surgeon: Melrose Nakayama, MD;  Location: [place];  Service: Open Heart Surgery;  Laterality: N/A;   INGUINAL HERNIA REPAIR Bilateral    INTRAOPERATIVE TRANSESOPHAGEAL ECHOCARDIOGRAM N/A 03/18/2015   Procedure: INTRAOPERATIVE TRANSESOPHAGEAL ECHOCARDIOGRAM;  Surgeon: Melrose Nakayama, MD;  Location: [place];  Service: Open Heart Surgery;  Laterality: N/A;   UMBILICAL HERNIA REPAIR  1965    MEDICATIONS:  aspirin EC 81 MG tablet   atorvastatin (LIPITOR) 80 MG tablet   carvedilol (COREG) 6.25 MG tablet   donepezil (ARICEPT) 10 MG tablet   EPINEPHrine 0.3 mg/0.3 mL IJ SOAJ injection   escitalopram (LEXAPRO) 10 MG tablet   ferrous sulfate 324 MG TBEC   glucose blood (FREESTYLE LITE) test strip   Lancets (FREESTYLE) lancets   levothyroxine (SYNTHROID) 75 MCG tablet   losartan (COZAAR) 50 MG tablet   metFORMIN (GLUCOPHAGE) 1000 MG tablet   Multiple Vitamin (MULTIVITAMIN) tablet   vitamin B-12 (CYANOCOBALAMIN) 1000 MCG tablet   vitamin C (ASCORBIC ACID) 500 MG tablet   No current facility-administered medications for this encounter.     Konrad Felix Ward, PA-C [facility]-Surgical Testing [phone]

## 2021-09-20 ENCOUNTER — Other Ambulatory Visit: Payer: Self-pay | Admitting: Internal Medicine

## 2021-09-20 ENCOUNTER — Ambulatory Visit (HOSPITAL_COMMUNITY)
Admission: RE | Admit: 2021-09-20 | Discharge: 2021-09-20 | Disposition: A | Discharge status: Home / Self Care | Payer: PPO | Source: Ambulatory Visit | Attending: Internal Medicine | Admitting: Internal Medicine

## 2021-09-20 DIAGNOSIS — K7689 Other specified diseases of liver: Secondary | ICD-10-CM | POA: Diagnosis not present

## 2021-09-20 DIAGNOSIS — K5730 Diverticulosis of large intestine without perforation or abscess without bleeding: Secondary | ICD-10-CM | POA: Diagnosis not present

## 2021-09-20 DIAGNOSIS — N281 Cyst of kidney, acquired: Secondary | ICD-10-CM | POA: Diagnosis not present

## 2021-09-20 DIAGNOSIS — K862 Cyst of pancreas: Secondary | ICD-10-CM | POA: Insufficient documentation

## 2021-09-20 MED ORDER — GADOBUTROL 1 MMOL/ML IV SOLN
7.0000 mL | Freq: Once | INTRAVENOUS | Status: AC | PRN
  Administered 2021-09-20: 7 mL via INTRAVENOUS

## 2021-09-21 ENCOUNTER — Other Ambulatory Visit: Payer: Self-pay | Admitting: Family Medicine

## 2021-09-21 ENCOUNTER — Other Ambulatory Visit: Payer: Self-pay | Admitting: Cardiology

## 2021-09-21 ENCOUNTER — Other Ambulatory Visit: Payer: Self-pay

## 2021-09-21 ENCOUNTER — Ambulatory Visit (HOSPITAL_COMMUNITY): Payer: PPO | Admitting: Physician Assistant

## 2021-09-21 ENCOUNTER — Encounter (HOSPITAL_COMMUNITY): Payer: Self-pay | Admitting: Orthopedic Surgery

## 2021-09-21 ENCOUNTER — Encounter (HOSPITAL_COMMUNITY)
Admission: RE | Disposition: A | Discharge status: Home / Self Care | Payer: Self-pay | Source: Home / Self Care | Attending: Orthopedic Surgery

## 2021-09-21 ENCOUNTER — Ambulatory Visit (HOSPITAL_BASED_OUTPATIENT_CLINIC_OR_DEPARTMENT_OTHER): Payer: PPO | Admitting: Anesthesiology

## 2021-09-21 ENCOUNTER — Ambulatory Visit (HOSPITAL_COMMUNITY)
Admission: RE | Admit: 2021-09-21 | Discharge: 2021-09-21 | Disposition: A | Discharge status: Home / Self Care | Payer: PPO | Attending: Orthopedic Surgery | Admitting: Orthopedic Surgery

## 2021-09-21 DIAGNOSIS — S46012A Strain of muscle(s) and tendon(s) of the rotator cuff of left shoulder, initial encounter: Secondary | ICD-10-CM | POA: Diagnosis not present

## 2021-09-21 DIAGNOSIS — M129 Arthropathy, unspecified: Secondary | ICD-10-CM | POA: Diagnosis not present

## 2021-09-21 DIAGNOSIS — Z87891 Personal history of nicotine dependence: Secondary | ICD-10-CM | POA: Diagnosis not present

## 2021-09-21 DIAGNOSIS — Z79899 Other long term (current) drug therapy: Secondary | ICD-10-CM | POA: Insufficient documentation

## 2021-09-21 DIAGNOSIS — M12812 Other specific arthropathies, not elsewhere classified, left shoulder: Secondary | ICD-10-CM | POA: Diagnosis not present

## 2021-09-21 DIAGNOSIS — I129 Hypertensive chronic kidney disease with stage 1 through stage 4 chronic kidney disease, or unspecified chronic kidney disease: Secondary | ICD-10-CM | POA: Insufficient documentation

## 2021-09-21 DIAGNOSIS — Z833 Family history of diabetes mellitus: Secondary | ICD-10-CM | POA: Diagnosis not present

## 2021-09-21 DIAGNOSIS — E1151 Type 2 diabetes mellitus with diabetic peripheral angiopathy without gangrene: Secondary | ICD-10-CM

## 2021-09-21 DIAGNOSIS — M75102 Unspecified rotator cuff tear or rupture of left shoulder, not specified as traumatic: Secondary | ICD-10-CM

## 2021-09-21 DIAGNOSIS — Z951 Presence of aortocoronary bypass graft: Secondary | ICD-10-CM | POA: Diagnosis not present

## 2021-09-21 DIAGNOSIS — Z7984 Long term (current) use of oral hypoglycemic drugs: Secondary | ICD-10-CM | POA: Insufficient documentation

## 2021-09-21 DIAGNOSIS — E1122 Type 2 diabetes mellitus with diabetic chronic kidney disease: Secondary | ICD-10-CM | POA: Diagnosis not present

## 2021-09-21 DIAGNOSIS — G8918 Other acute postprocedural pain: Secondary | ICD-10-CM | POA: Diagnosis not present

## 2021-09-21 DIAGNOSIS — E039 Hypothyroidism, unspecified: Secondary | ICD-10-CM | POA: Diagnosis not present

## 2021-09-21 DIAGNOSIS — N189 Chronic kidney disease, unspecified: Secondary | ICD-10-CM | POA: Insufficient documentation

## 2021-09-21 DIAGNOSIS — Z8679 Personal history of other diseases of the circulatory system: Secondary | ICD-10-CM | POA: Diagnosis not present

## 2021-09-21 DIAGNOSIS — I252 Old myocardial infarction: Secondary | ICD-10-CM | POA: Insufficient documentation

## 2021-09-21 DIAGNOSIS — M25712 Osteophyte, left shoulder: Secondary | ICD-10-CM | POA: Diagnosis not present

## 2021-09-21 DIAGNOSIS — K219 Gastro-esophageal reflux disease without esophagitis: Secondary | ICD-10-CM | POA: Insufficient documentation

## 2021-09-21 DIAGNOSIS — I2510 Atherosclerotic heart disease of native coronary artery without angina pectoris: Secondary | ICD-10-CM | POA: Diagnosis not present

## 2021-09-21 DIAGNOSIS — F32A Depression, unspecified: Secondary | ICD-10-CM | POA: Insufficient documentation

## 2021-09-21 DIAGNOSIS — M19012 Primary osteoarthritis, left shoulder: Secondary | ICD-10-CM | POA: Diagnosis not present

## 2021-09-21 DIAGNOSIS — W19XXXA Unspecified fall, initial encounter: Secondary | ICD-10-CM | POA: Diagnosis not present

## 2021-09-21 HISTORY — PX: REVERSE SHOULDER ARTHROPLASTY: SHX5054

## 2021-09-21 LAB — GLUCOSE, CAPILLARY: Glucose-Capillary: 135 mg/dL — ABNORMAL HIGH (ref 70–99)

## 2021-09-21 SURGERY — ARTHROPLASTY, SHOULDER, TOTAL, REVERSE
Anesthesia: General | Site: Shoulder | Laterality: Left

## 2021-09-21 MED ORDER — PHENYLEPHRINE HCL-NACL 20-0.9 MG/250ML-% IV SOLN
INTRAVENOUS | Status: DC | PRN
  Administered 2021-09-21: 50 ug/min via INTRAVENOUS

## 2021-09-21 MED ORDER — VANCOMYCIN HCL 1000 MG IV SOLR
INTRAVENOUS | Status: DC | PRN
  Administered 2021-09-21: 1000 mg

## 2021-09-21 MED ORDER — ONDANSETRON HCL 4 MG/2ML IJ SOLN
INTRAMUSCULAR | Status: AC
  Filled 2021-09-21: qty 2

## 2021-09-21 MED ORDER — DEXAMETHASONE SODIUM PHOSPHATE 10 MG/ML IJ SOLN
INTRAMUSCULAR | Status: DC | PRN
  Administered 2021-09-21: 4 mg via INTRAVENOUS

## 2021-09-21 MED ORDER — CEFAZOLIN SODIUM-DEXTROSE 2-4 GM/100ML-% IV SOLN
2.0000 g | INTRAVENOUS | Status: AC
  Administered 2021-09-21: 2 g via INTRAVENOUS
  Filled 2021-09-21: qty 100

## 2021-09-21 MED ORDER — MIDAZOLAM HCL 2 MG/2ML IJ SOLN
1.0000 mg | Freq: Once | INTRAMUSCULAR | Status: DC
  Filled 2021-09-21: qty 2

## 2021-09-21 MED ORDER — DEXAMETHASONE SODIUM PHOSPHATE 10 MG/ML IJ SOLN
INTRAMUSCULAR | Status: AC
Start: 2021-09-21 — End: ?
  Filled 2021-09-21: qty 1

## 2021-09-21 MED ORDER — SUGAMMADEX SODIUM 200 MG/2ML IV SOLN
INTRAVENOUS | Status: DC | PRN
  Administered 2021-09-21: 200 mg via INTRAVENOUS

## 2021-09-21 MED ORDER — ONDANSETRON HCL 4 MG/2ML IJ SOLN: 4.0000 mg | Freq: Once | INTRAMUSCULAR | Status: DC | PRN

## 2021-09-21 MED ORDER — LACTATED RINGERS IV BOLUS
500.0000 mL | Freq: Once | INTRAVENOUS | Status: AC
  Administered 2021-09-21: 500 mL via INTRAVENOUS

## 2021-09-21 MED ORDER — ONDANSETRON HCL 4 MG PO TABS: 4.0000 mg | ORAL_TABLET | Freq: Three times a day (TID) | ORAL | 0 refills | Status: DC | PRN

## 2021-09-21 MED ORDER — CHLORHEXIDINE GLUCONATE 0.12 % MT SOLN
15.0000 mL | Freq: Once | OROMUCOSAL | Status: AC
  Administered 2021-09-21: 15 mL via OROMUCOSAL

## 2021-09-21 MED ORDER — MUPIROCIN 2 % EX OINT
1.0000 | TOPICAL_OINTMENT | Freq: Once | CUTANEOUS | Status: AC
  Administered 2021-09-21: 1 via TOPICAL

## 2021-09-21 MED ORDER — LACTATED RINGERS IV SOLN: INTRAVENOUS | Status: DC

## 2021-09-21 MED ORDER — FENTANYL CITRATE PF 50 MCG/ML IJ SOSY
50.0000 ug | PREFILLED_SYRINGE | Freq: Once | INTRAMUSCULAR | Status: AC
  Administered 2021-09-21: 50 ug via INTRAVENOUS
  Filled 2021-09-21: qty 2

## 2021-09-21 MED ORDER — ROCURONIUM BROMIDE 10 MG/ML (PF) SYRINGE
PREFILLED_SYRINGE | INTRAVENOUS | Status: DC | PRN
  Administered 2021-09-21: 20 mg via INTRAVENOUS
  Administered 2021-09-21: 50 mg via INTRAVENOUS

## 2021-09-21 MED ORDER — 0.9 % SODIUM CHLORIDE (POUR BTL) OPTIME
TOPICAL | Status: DC | PRN
  Administered 2021-09-21: 1000 mL

## 2021-09-21 MED ORDER — HYDROCODONE-ACETAMINOPHEN 5-325 MG PO TABS: 1.0000 | ORAL_TABLET | Freq: Four times a day (QID) | ORAL | 0 refills | Status: DC | PRN

## 2021-09-21 MED ORDER — ONDANSETRON HCL 4 MG/2ML IJ SOLN
INTRAMUSCULAR | Status: DC | PRN
  Administered 2021-09-21: 4 mg via INTRAVENOUS

## 2021-09-21 MED ORDER — ACETAMINOPHEN 500 MG PO TABS
1000.0000 mg | ORAL_TABLET | Freq: Once | ORAL | Status: AC
  Administered 2021-09-21: 1000 mg via ORAL
  Filled 2021-09-21: qty 2

## 2021-09-21 MED ORDER — MUPIROCIN 2 % EX OINT
TOPICAL_OINTMENT | CUTANEOUS | Status: AC
  Filled 2021-09-21: qty 22

## 2021-09-21 MED ORDER — PROPOFOL 10 MG/ML IV BOLUS
INTRAVENOUS | Status: DC | PRN
  Administered 2021-09-21: 140 mg via INTRAVENOUS

## 2021-09-21 MED ORDER — ORAL CARE MOUTH RINSE: 15.0000 mL | Freq: Once | OROMUCOSAL | Status: AC

## 2021-09-21 MED ORDER — TRANEXAMIC ACID-NACL 1000-0.7 MG/100ML-% IV SOLN
1000.0000 mg | INTRAVENOUS | Status: DC
  Filled 2021-09-21: qty 100

## 2021-09-21 MED ORDER — VANCOMYCIN HCL 1000 MG IV SOLR
INTRAVENOUS | Status: AC
Start: 2021-09-21 — End: ?
  Filled 2021-09-21: qty 20

## 2021-09-21 MED ORDER — BUPIVACAINE HCL (PF) 0.5 % IJ SOLN
INTRAMUSCULAR | Status: DC | PRN
  Administered 2021-09-21: 10 mL via PERINEURAL

## 2021-09-21 MED ORDER — LACTATED RINGERS IV BOLUS: 250.0000 mL | Freq: Once | INTRAVENOUS | Status: DC

## 2021-09-21 MED ORDER — STERILE WATER FOR IRRIGATION IR SOLN
Status: DC | PRN
  Administered 2021-09-21: 2000 mL

## 2021-09-21 MED ORDER — LIDOCAINE 2% (20 MG/ML) 5 ML SYRINGE
INTRAMUSCULAR | Status: DC | PRN
  Administered 2021-09-21: 60 mg via INTRAVENOUS

## 2021-09-21 MED ORDER — FENTANYL CITRATE PF 50 MCG/ML IJ SOSY: 25.0000 ug | PREFILLED_SYRINGE | INTRAMUSCULAR | Status: DC | PRN

## 2021-09-21 MED ORDER — ROCURONIUM BROMIDE 10 MG/ML (PF) SYRINGE
PREFILLED_SYRINGE | INTRAVENOUS | Status: AC
  Filled 2021-09-21: qty 10

## 2021-09-21 MED ORDER — PROPOFOL 10 MG/ML IV BOLUS
INTRAVENOUS | Status: AC
Start: 2021-09-21 — End: ?
  Filled 2021-09-21: qty 20

## 2021-09-21 MED ORDER — BUPIVACAINE LIPOSOME 1.3 % IJ SUSP
INTRAMUSCULAR | Status: DC | PRN
  Administered 2021-09-21: 10 mL via PERINEURAL

## 2021-09-21 SURGICAL SUPPLY — 66 items
BAG COUNTER SPONGE SURGICOUNT (BAG) ×1 IMPLANT
BAG ZIPLOCK 12X15 (MISCELLANEOUS) ×2 IMPLANT
BLADE SAW SGTL 83.5X18.5 (BLADE) ×2 IMPLANT
BNDG COHESIVE 4X5 TAN ST LF (GAUZE/BANDAGES/DRESSINGS) ×2 IMPLANT
COOLER ICEMAN CLASSIC (MISCELLANEOUS) ×2 IMPLANT
COVER BACK TABLE 60X90IN (DRAPES) ×2 IMPLANT
COVER SURGICAL LIGHT HANDLE (MISCELLANEOUS) ×2 IMPLANT
CUP SUT UNIV REVERS 39 NEU (Shoulder) ×1 IMPLANT
DERMABOND ADVANCED (GAUZE/BANDAGES/DRESSINGS) ×1
DERMABOND ADVANCED .7 DNX12 (GAUZE/BANDAGES/DRESSINGS) ×1 IMPLANT
DRAPE ORTHO SPLIT 77X108 STRL (DRAPES) ×4
DRAPE SHEET LG 3/4 BI-LAMINATE (DRAPES) ×2 IMPLANT
DRAPE SURG 17X11 SM STRL (DRAPES) ×2 IMPLANT
DRAPE SURG ORHT 6 SPLT 77X108 (DRAPES) ×2 IMPLANT
DRAPE TOP 10253 STERILE (DRAPES) ×2 IMPLANT
DRAPE U-SHAPE 47X51 STRL (DRAPES) ×2 IMPLANT
DRESSING AQUACEL AG SP 3.5X6 (GAUZE/BANDAGES/DRESSINGS) ×1 IMPLANT
DRSG AQUACEL AG ADV 3.5X 6 (GAUZE/BANDAGES/DRESSINGS) ×1 IMPLANT
DRSG AQUACEL AG SP 3.5X6 (GAUZE/BANDAGES/DRESSINGS) ×2
DRSG TEGADERM 8X12 (GAUZE/BANDAGES/DRESSINGS) ×2 IMPLANT
DURAPREP 26ML APPLICATOR (WOUND CARE) ×2 IMPLANT
ELECT BLADE TIP CTD 4 INCH (ELECTRODE) ×2 IMPLANT
ELECT PENCIL ROCKER SW 15FT (MISCELLANEOUS) ×2 IMPLANT
ELECT REM PT RETURN 15FT ADLT (MISCELLANEOUS) ×2 IMPLANT
FACESHIELD WRAPAROUND (MASK) ×8 IMPLANT
FACESHIELD WRAPAROUND OR TEAM (MASK) ×4 IMPLANT
GLENOID UNI REV MOD 24 +2 LAT (Joint) ×1 IMPLANT
GLENOSPHERE 36 +4 LAT/24 (Joint) ×1 IMPLANT
GLOVE BIO SURGEON STRL SZ7.5 (GLOVE) ×2 IMPLANT
GLOVE BIO SURGEON STRL SZ8 (GLOVE) ×2 IMPLANT
GLOVE SS BIOGEL STRL SZ 7 (GLOVE) ×1 IMPLANT
GLOVE SS BIOGEL STRL SZ 7.5 (GLOVE) ×1 IMPLANT
GLOVE SUPERSENSE BIOGEL SZ 7 (GLOVE) ×2
GLOVE SUPERSENSE BIOGEL SZ 7.5 (GLOVE) ×3
GOWN STRL REIN XL XLG (GOWN DISPOSABLE) ×4 IMPLANT
INSERT HUMERAL 39/+6 (Insert) ×1 IMPLANT
KIT BASIN OR (CUSTOM PROCEDURE TRAY) ×2 IMPLANT
KIT TURNOVER KIT A (KITS) ×1 IMPLANT
MANIFOLD NEPTUNE II (INSTRUMENTS) ×2 IMPLANT
NDL TAPERED W/ NITINOL LOOP (MISCELLANEOUS) ×1 IMPLANT
NEEDLE TAPERED W/ NITINOL LOOP (MISCELLANEOUS) ×2 IMPLANT
NS IRRIG 1000ML POUR BTL (IV SOLUTION) ×2 IMPLANT
PACK SHOULDER (CUSTOM PROCEDURE TRAY) ×2 IMPLANT
PAD ARMBOARD 7.5X6 YLW CONV (MISCELLANEOUS) ×2 IMPLANT
PAD COLD SHLDR WRAP-ON (PAD) ×2 IMPLANT
PIN SET MODULAR GLENOID SYSTEM (PIN) ×1 IMPLANT
RESTRAINT HEAD UNIVERSAL NS (MISCELLANEOUS) ×2 IMPLANT
SCREW CENTRAL MOD 30MM (Screw) ×1 IMPLANT
SCREW PERI LOCK 5.5X16 (Screw) ×1 IMPLANT
SCREW PERIPHERAL 5.5X20 LOCK (Screw) ×1 IMPLANT
SCREW PERIPHERAL 5.5X28 LOCK (Screw) ×2 IMPLANT
SLING ARM FOAM STRAP MED (SOFTGOODS) ×1 IMPLANT
SPONGE T-LAP 4X18 ~~LOC~~+RFID (SPONGE) ×2 IMPLANT
STEM HUMERAL UNI REVERSE SZ10 (Stem) ×1 IMPLANT
SUCTION FRAZIER HANDLE 12FR (TUBING) ×2
SUCTION TUBE FRAZIER 12FR DISP (TUBING) ×1 IMPLANT
SUT MNCRL AB 3-0 PS2 18 (SUTURE) ×2 IMPLANT
SUT MON AB 2-0 CT1 36 (SUTURE) ×2 IMPLANT
SUT VIC AB 1 CT1 36 (SUTURE) ×2 IMPLANT
SUTURE TAPE 1.3 40 TPR END (SUTURE) ×2 IMPLANT
SUTURETAPE 1.3 40 TPR END (SUTURE) ×4
TOWEL OR 17X26 10 PK STRL BLUE (TOWEL DISPOSABLE) ×2 IMPLANT
TOWEL OR NON WOVEN STRL DISP B (DISPOSABLE) ×2 IMPLANT
TUBE SUCTION HIGH CAP CLEAR NV (SUCTIONS) ×2 IMPLANT
WATER STERILE IRR 1000ML POUR (IV SOLUTION) ×4 IMPLANT
YANKAUER SUCT BULB TIP 10FT TU (MISCELLANEOUS) ×1 IMPLANT

## 2021-09-21 NOTE — Discharge Instructions (Signed)

## 2021-09-21 NOTE — H&P (Signed)
Miguel Vazquez    Chief Complaint: Left shoulder rotator cuff tear arthropathy HPI: The patient is a 81 y.o. male with chronic left shoulder pain with a recent acute traumatic injury now with complete loss of active elevation of left arm related to severe rotator cuff tear arthropathy.  Due to his increasing functional limitations and failure to respond to conservative management, he is brought to the operating room at this time for planned left shoulder reverse arthroplasty  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   Diabetes mellitus    Diverticulitis    Gait abnormality 01/11/2021   GERD (gastroesophageal reflux disease)    History of kidney stones    Hx of abdominal aortic aneurysm 1998   Hyperlipidemia    Hypertension    Lower GI bleed    Memory loss    Myocardial infarction ([place])    2016   Personal history of colonic polyps - adenomas 09/09/2013      Past Surgical History:  Procedure Laterality Date   ABDOMINAL AORTIC ANEURYSM REPAIR  1998   CARDIAC CATHETERIZATION N/A 03/18/2015   Procedure: Left Heart Cath and Coronary Angiography;  Surgeon: Peter M Martinique, MD;  Location: [facility];  Service: Cardiovascular;  Laterality: N/A;   CATARACT EXTRACTION Right    COLONOSCOPY     CORONARY ARTERY BYPASS GRAFT N/A 03/18/2015   Procedure: CORONARY ARTERY BYPASS GRAFTING (CABG) x  three, using left internal mammary artery and right leg greater saphenous vein harvested endoscopically;  Surgeon: Melrose Nakayama, MD;  Location: [facility];  Service: Open Heart Surgery;  Laterality: N/A;   INGUINAL HERNIA REPAIR Bilateral    INTRAOPERATIVE TRANSESOPHAGEAL ECHOCARDIOGRAM N/A 03/18/2015   Procedure: INTRAOPERATIVE TRANSESOPHAGEAL ECHOCARDIOGRAM;  Surgeon: Melrose Nakayama, MD;  Location: [facility];  Service: Open Heart Surgery;  Laterality: N/A;   UMBILICAL HERNIA REPAIR  1965    Family History  Problem Relation Age of Onset   Bone cancer Mother    Diabetes Paternal Aunt    Stroke Son    Down syndrome Son    Colon cancer Neg Hx    Esophageal cancer Neg Hx    Rectal cancer Neg Hx    Stomach cancer Neg Hx     Social History:  reports that he quit smoking about 35 years ago. His smoking use included cigarettes. He has a 30.00 pack-year smoking history. He has never used smokeless tobacco. He reports that he does not currently use alcohol. He reports that he does not use drugs.  BMI: Estimated body mass index is 23.65 kg/m as calculated from the following:   Height as of 09/12/21: '5\' 7"'$  (1.702 m).   Weight as of this encounter: 68.5 kg.  Lab Results  Component Value Date   ALBUMIN 4.2 01/03/2021   Diabetes:   Patient has a diagnosis of diabetes,  Lab Results  Component Value Date   HGBA1C 6.4 (A) 08/29/2021   Smoking Status: Social History   Tobacco Use  Smoking Status Former   Packs/day: 1.50   Years: 20.00   Total pack years: 30.00   Types: Cigarettes   Quit date: 08/24/1986   Years since quitting: 35.1  Smokeless Tobacco Never  The patient is not currently a tobacco user. Counseling given: Not Answered      Medications Prior to Admission  Medication Sig Dispense Refill   aspirin EC 81 MG tablet Take 1 tablet (81 mg total) by mouth daily. 90 tablet 3   atorvastatin (LIPITOR) 80 MG tablet TAKE 1 TABLET BY MOUTH DAILY AT 6 PM. 90 tablet 2   carvedilol (COREG) 6.25 MG tablet TAKE 1 TABLET BY MOUTH TWICE A DAY 180 tablet 3   donepezil (ARICEPT) 10 MG tablet TAKE 1 TABLET BY MOUTH EVERYDAY AT BEDTIME 90 tablet 3   EPINEPHrine 0.3 mg/0.3 mL IJ SOAJ injection Inject 0.3 mLs (0.3 mg total) into the muscle as needed for anaphylaxis. 2 each 3   escitalopram (LEXAPRO) 10 MG tablet TAKE 1 TABLET BY MOUTH EVERY DAY 90 tablet 1    ferrous sulfate 324 MG TBEC Take 324 mg by mouth daily.     levothyroxine (SYNTHROID) 75 MCG tablet TAKE 1 TABLET BY MOUTH EVERY DAY 90 tablet 1   losartan (COZAAR) 50 MG tablet TAKE 1 TABLET BY MOUTH EVERY DAY 90 tablet 3   metFORMIN (GLUCOPHAGE) 1000 MG tablet TAKE 1 TABLET BY MOUTH TWICE A DAY 180 tablet 1   Multiple Vitamin (MULTIVITAMIN) tablet Take 1 tablet by mouth daily. Centrium Silver once daily     vitamin B-12 (CYANOCOBALAMIN) 1000 MCG tablet Take 1,000 mcg by mouth daily.     vitamin C (ASCORBIC ACID) 500 MG tablet Take 500 mg by mouth every evening.      glucose blood (FREESTYLE LITE) test strip Check Blood Sugars 1-2 times per day. DX: E11.9. 100 each 5   Lancets (FREESTYLE) lancets Check Blood Sugars 1-2 times per day. DX: E11.9 100 each 5     Physical Exam: Left shoulder demonstrates diffuse swelling in the subdeltoid region but there is no erythema or induration.  Neurovascular intact.  Profoundly restricted motion with global weakness.  Plain radiographs confirm changes consistent with chronic rotator cuff tear arthropathy.  This is confirmed with recent MRI scan.    Vitals  Temp:  [97.7 F (36.5 C)] 97.7 F (36.5 C) (06/22 1107) Pulse Rate:  [64] 64 (06/22 1107) Resp:  [12] 12 (06/22 1107) BP: (160)/(112) 160/112 (06/22 1107) SpO2:  [98 %] 98 % (06/22 1107) Weight:  [68.5 kg] 68.5 kg (06/22 1056)  Assessment/Plan  Impression: Left shoulder rotator cuff tear arthropathy  Plan of Action: Procedure(s): REVERSE SHOULDER ARTHROPLASTY  Maxon Kresse M Kora Groom 09/21/2021, 11:54 AM Contact # [phone]

## 2021-09-21 NOTE — Progress Notes (Signed)
Assisted Dr. Turk with left, interscalene , ultrasound guided block. Side rails up, monitors on throughout procedure. See vital signs in flow sheet. Tolerated Procedure well. 

## 2021-09-21 NOTE — Transfer of Care (Signed)
Immediate Anesthesia Transfer of Care Note  Patient: Miguel Vazquez  Procedure(s) Performed: REVERSE SHOULDER ARTHROPLASTY (Left: Shoulder)  Patient Location: PACU  Anesthesia Type:General  Level of Consciousness: awake  Airway & Oxygen Therapy: Patient Spontanous Breathing and Patient connected to face mask oxygen  Post-op Assessment: Report given to RN and Post -op Vital signs reviewed and stable  Post vital signs: Reviewed and stable  Last Vitals:  Vitals Value Taken Time  BP 153/68 09/21/21 1428  Temp    Pulse 62 09/21/21 1428  Resp 16 09/21/21 1428  SpO2 100 % 09/21/21 1428  Vitals shown include unvalidated device data.  Last Pain:  Vitals:   09/21/21 1204  TempSrc:   PainSc: 0-No pain         Complications: No notable events documented.

## 2021-09-21 NOTE — Care Plan (Signed)
Ortho Bundle Case Management Note  Patient Details  Name: Miguel Vazquez MRN: [id number] Date of Birth: 02-15-1941  L Rev TSA on 09-21-21 DCP:  Home with wife DME:  Sling and CTU provided by hosp PT:  Mauro Kaufmann PT at 4 weeks p/o                    DME Arranged:  N/A DME Agency:  NA  HH Arranged:  NA [place] Agency:  NA  Additional Comments: Please contact me with any questions of if this plan should need to change.  Marianne Sofia, RN,CCM [facility]  [phone] 09/21/2021, 10:52 AM

## 2021-09-21 NOTE — Op Note (Signed)
09/21/2021  2:00 PM  PATIENT:   Miguel Vazquez  81 y.o. male  PRE-OPERATIVE DIAGNOSIS:  Left shoulder rotator cuff tear arthropathy  POST-OPERATIVE DIAGNOSIS: Same  PROCEDURE: Left shoulder reverse arthroplasty utilizing a press-fit size 10 Arthrex stem with a neutral metaphysis, +6 polyethylene insert, 39/+4 glenosphere and a small/+2 baseplate  SURGEON:  Taylon Coole, Metta Clines M.D.  ASSISTANTS: Jenetta Loges, PA-C  ANESTHESIA:   General endotracheal and interscalene block with Exparel  EBL: 150 cc  SPECIMEN: None  Drains: None   PATIENT DISPOSITION:  PACU - hemodynamically stable.    PLAN OF CARE: Discharge to home after PACU  Brief history:  Patient is a 81 year old male without chronic and progressively increasing left shoulder pain related to rotator cuff tear arthropathy.  He had a recent fall with an apparent acute progression of rotator cuff damage with profound loss of mobility and pseudoparalysis.  Due to his increasing pain and functional limitations and failure to respond to prolonged attempts at conservative management, he is brought to the operating room at this time for planned left shoulder reverse arthroplasty.  Preoperatively, I counseled the patient regarding treatment options and risks versus benefits thereof.  Possible surgical complications were all reviewed including potential for bleeding, infection, neurovascular injury, persistent pain, loss of motion, anesthetic complication, failure of the implant, and possible need for additional surgery. They understand and accept and agrees with our planned procedure.   Procedure in detail:  After undergoing routine preop evaluation the patient received prophylactic antibiotics and interscalene block with Exparel was established in the holding area by the anesthesia department.  Patient subsequently placed supine on the operating table and underwent the smooth induction of a general endotracheal anesthesia.  Placed into  the beachchair position and appropriately padded and protected.  Left shoulder girdle region was sterilely prepped and draped in standard fashion.  Timeout was called.  A deltopectoral approach left shoulder is made an approximately 8 cm incision.  Skin flaps were elevated dissection carried deeply and the deltopectoral interval was then developed from proximal to distal with the vein taken laterally.  The long head biceps tendon appeared to have previously ruptured although there was a stump within the bicipital groove and we did tenodesed this segment to the upper border the pectoralis major tendon with this proximal segment then unroofed and excised.  The remnant of the rotator cuff superiorly was split to the base of the coracoid and the subscap was then separated from the lesser tuberosity using electrocautery and tagged with a pair of suture tape sutures.  Capsular attachments were then divided from the anterior and inferior margins of the humeral neck and the humeral head was then delivered through the wound.  An extra medullary guide was then used to outline the proposed humeral head resection and this was then completed with an oscillating saw at approximately 20 degrees retroversion.  A metal cap was then placed over the cut proximal humeral surface after marginal osteophytes were removed.  The glenoid was then exposed and a circumferential labral resection was performed.  A guidepin delivered into the center of the glenoid and the glenoid was then prepared with the central followed by the peripheral reamer to a stable subchondral bony bed.  Preparation completed with the central drill and tapped for a 30 mm lag screw.  Our baseplate was then assembled and vancomycin powder applied to the threads lag screw the baseplate was inserted with excellent purchase and fixation.  The peripheral locking screws were all  then placed using standard technique with excellent purchase and fixation.  A 39/+4 glenosphere  was then impacted onto the baseplate and the central locking screw was placed.  We returned our attention to the proximal humerus where the canal was opened and broached up to a size 10.  A neutral metaphyseal reaming guide was placed for metaphyseal preparation.  A trial was then placed and this showed good motion good stability and soft tissue balance.  At this point our final implant was then assembled.  The canal was cleaned and dried with vancomycin powder spread into the canal.  The final implant was then seated with excellent purchase and fixation.  Trial reduction was then performed and we ultimately felt that the +6 polyethylene insert gave [place] the best motion stability and soft tissue balance.  The trial was then removed and a final +6 poly was then impacted onto the implant and the final reduction was then performed showing good motion good stability and good soft tissue balance.  This point final irrigation was completed.  Hemostasis was obtained.  The subscapularis was confirmed to have good elasticity and was then repaired back to the eyelets on the collar the implant using the previously placed suture tape sutures.  The arm easily achieved 45 degrees of external rotation without excessive tension on the subscap repair.  This point the balance of the vancomycin powder was then spread liberally throughout the deep soft tissue planes.  The deltopectoral interval was reapproximated with a series of figure-of-eight number Vicryl sutures.  2-0 Monocryl used to close the subcu layer and intracuticular 3 Monocryl the skin followed by Dermabond and Aquacel dressing.  The left arm was then placed into a sling.  The patient was awakened, extubated, and taken to the recovery room in stable condition.  Jenetta Loges, PA-C was utilized as an Environmental consultant throughout this case, essential for help with positioning the patient, positioning extremity, tissue manipulation, implantation of the prosthesis, suture management,  wound closure, and intraoperative decision-making.  Marin Shutter MD   Contact # [phone]

## 2021-09-21 NOTE — Anesthesia Procedure Notes (Signed)
Procedure Name: Intubation Date/Time: 09/21/2021 12:51 PM  Performed by: Milford Cage, CRNAPre-anesthesia Checklist: Patient identified, Emergency Drugs available, Suction available and Patient being monitored Patient Re-evaluated:Patient Re-evaluated prior to induction Oxygen Delivery Method: Circle system utilized Preoxygenation: Pre-oxygenation with 100% oxygen Induction Type: IV induction Ventilation: Mask ventilation without difficulty Laryngoscope Size: Mac and 3 Grade View: Grade I Tube type: Oral Tube size: 7.5 mm Number of attempts: 1 Airway Equipment and Method: Stylet Placement Confirmation: ETT inserted through vocal cords under direct vision, positive ETCO2 and breath sounds checked- equal and bilateral Secured at: 22 cm Tube secured with: Tape Dental Injury: Teeth and Oropharynx as per pre-operative assessment

## 2021-09-21 NOTE — Evaluation (Signed)
Occupational Therapy Evaluation Patient Details Name: Miguel Vazquez MRN: [id number] DOB: [birth year] Today's Date: 09/21/2021   History of Present Illness Thsis 81 yo male admitted for planned left shoulder reverse arthroplasty. PHMs: CAD, CHF, DM, HTN.   Clinical Impression   This 81 yo male admitted and to undergo above. All education completed with pt and wife as well as handouts provided. Acute OT will sign off.      Recommendations for follow up therapy are one component of a multi-disciplinary discharge planning process, led by the attending physician.  Recommendations may be updated based on patient status, additional functional criteria and insurance authorization.   Follow Up Recommendations  No OT follow up    Assistance Recommended at Discharge Frequent or constant Supervision/Assistance  Patient can return home with the following A little help with walking and/or transfers;A lot of help with bathing/dressing/bathroom;Assistance with cooking/housework;Assist for transportation;Help with stairs or ramp for entrance    Functional Status Assessment  Patient has had a recent decline in their functional status and demonstrates the ability to make significant improvements in function in a reasonable and predictable amount of time. (no further skilled OT needs)  Equipment Recommendations  None recommended by OT       Precautions / Restrictions Precautions Precautions: Shoulder Type of Shoulder Precautions: If sitting in controlled environment, ok to come out of sling to give neck a break. Please sleep in it to protect until follow up in office.OK to use operative arm for feeding, hygiene and ADLs (20 ER, 45 ABD, 60 FF).Ok to instruct Pendulums and lap slides as exercises.AROM elbow, wrist and hand to tolerance-- Yes. No resisted internal rotation and no exercises for internal rotation Shoulder Interventions: Shoulder sling/immobilizer Precaution Booklet Issued: Yes  (comment) Restrictions Weight Bearing Restrictions: No      Mobility Bed Mobility Overal bed mobility: Modified Independent             General bed mobility comments: increased time on and off of stretcher                 Vision Baseline Vision/History: 1 Wears glasses Ability to See in Adequate Light: 0 Adequate Patient Visual Report: No change from baseline              Pertinent Vitals/Pain Pain Assessment Pain Assessment: 0-10 Pain Score: 2  Pain Location: left shoulder Pain Descriptors / Indicators: Aching Pain Intervention(s): Limited activity within patient's tolerance, Monitored during session     Hand Dominance Right   Extremity/Trunk Assessment Upper Extremity Assessment Upper Extremity Assessment: LUE deficits/detail LUE Deficits / Details: decreased AROM and PROM--reverse total shoulder surgery later today LUE Coordination: decreased gross motor           Communication Communication Communication: No difficulties   Cognition Arousal/Alertness: Awake/alert Behavior During Therapy: WFL for tasks assessed/performed Overall Cognitive Status: Within Functional Limits for tasks assessed                                          Exercises Other Exercises Other Exercises: Pt and wife aware pt needs to do 10 reps 3-5 times a day of composite finger flex/ext; wrist flex/ext, forearm supination/pronation, elbow flexion/ext, lap slides, and seated pendulums (forward/back and side/side)   Shoulder Instructions Shoulder Instructions Donning/doffing shirt without moving shoulder:  (pt and wife verbalized understanding) Method for sponge bathing under operated UE:  (pt and  wife verbalized understanding) Donning/doffing sling/immobilizer:  (pt and wife verbalized understanding) Correct positioning of sling/immobilizer:  (pt and wife verbalized understanding) Pendulum exercises (written home exercise program):  (pt and wife verbalized  understanding, pt has to do them seated due to decreased balance in standing--could only do forward and back as well as left to right--could not get the hang of circles) ROM for elbow, wrist and digits of operated UE:  (pt and wife verbalized understanding) Sling wearing schedule (on at all times/off for ADL's):  (pt and wife verbalized understanding) Proper positioning of operated UE when showering:  (pt and wife verbalized understanding) Dressing change:  (NA) Positioning of UE while sleeping:  (pt and wife verbalized understanding)    Home Living Family/patient expects to be discharged to:: Private residence Living Arrangements: Spouse/significant other Available Help at Discharge: Family;Available 24 hours/day Type of Home: House Home Access: Stairs to enter   Entrance Stairs-Rails: Right Home Layout: Multi-level;Able to live on main level with bedroom/bathroom     Bathroom Shower/Tub: Occupational psychologist: Standard     Home Equipment: Shower seat - built in;Grab bars - toilet;Grab bars - tub/shower;Rolling Environmental consultant (2 wheels);Wheelchair - manual          Prior Functioning/Environment Prior Level of Function : Independent/Modified Independent                        OT Problem List: Decreased strength;Decreased range of motion;Pain         OT Goals(Current goals can be found in the care plan section) Acute Rehab OT Goals Patient Stated Goal: surgery on shoulder and home today OT Goal Formulation: With patient/family         AM-PAC OT "6 Clicks" Daily Activity     Outcome Measure Help from another person eating meals?: A Little Help from another person taking care of personal grooming?: A Lot Help from another person toileting, which includes using toliet, bedpan, or urinal?: A Lot Help from another person bathing (including washing, rinsing, drying)?: A Lot Help from another person to put on and taking off regular upper body clothing?: A Lot Help  from another person to put on and taking off regular lower body clothing?: A Lot 6 Click Score: 13   End of Session Nurse Communication:  (all OT education completed)  Activity Tolerance: Patient tolerated treatment well Patient left:  (laying on stretcher)  OT Visit Diagnosis: Unsteadiness on feet (R26.81);Pain Pain - Right/Left: Left Pain - part of body: Shoulder                Time: 1610-9604 OT Time Calculation (min): 28 min Charges:  OT General Charges $OT Visit: 1 Visit OT Evaluation $OT Eval Moderate Complexity: 1 Mod OT Treatments $Self Care/Home Management : 8-22 mins  Miguel Vazquez, OTR/L [facility] [phone] Office [phone]    Miguel Vazquez 09/21/2021, 2:50 PM

## 2021-09-21 NOTE — Anesthesia Procedure Notes (Signed)
Anesthesia Regional Block: Interscalene brachial plexus block   Pre-Anesthetic Checklist: , timeout performed,  Correct Patient, Correct Site, Correct Laterality,  Correct Procedure, Correct Position, site marked,  Risks and benefits discussed,  Surgical consent,  Pre-op evaluation,  At surgeon's request and post-op pain management  Laterality: Left  Prep: chloraprep       Needles:  Injection technique: Single-shot  Needle Type: Echogenic Stimulator Needle     Needle Length: 5cm  Needle Gauge: 22     Additional Needles:   Procedures:,,,, ultrasound used (permanent image in chart),,    Narrative:  Start time: 09/21/2021 11:57 AM End time: 09/21/2021 12:03 PM Injection made incrementally with aspirations every 5 mL.  Performed by: Personally  Anesthesiologist: Santa Lighter, MD  Additional Notes: Functioning IV was confirmed and monitors were applied.  A 40m 22ga Arrow echogenic stimulator needle was used. Sterile prep and drape, hand hygiene, and sterile gloves were used.  Negative aspiration and negative test dose prior to incremental administration of local anesthetic. The patient tolerated the procedure well.  Ultrasound guidance: relevent anatomy identified, needle position confirmed, local anesthetic spread visualized around nerve(s), vascular puncture avoided.  Image printed for medical record.

## 2021-09-22 ENCOUNTER — Encounter (HOSPITAL_COMMUNITY): Payer: Self-pay | Admitting: Orthopedic Surgery

## 2021-10-06 DIAGNOSIS — Z4789 Encounter for other orthopedic aftercare: Secondary | ICD-10-CM | POA: Diagnosis not present

## 2021-10-10 DIAGNOSIS — Z96612 Presence of left artificial shoulder joint: Secondary | ICD-10-CM | POA: Diagnosis not present

## 2021-10-10 DIAGNOSIS — M19012 Primary osteoarthritis, left shoulder: Secondary | ICD-10-CM | POA: Diagnosis not present

## 2021-10-13 DIAGNOSIS — M19012 Primary osteoarthritis, left shoulder: Secondary | ICD-10-CM | POA: Diagnosis not present

## 2021-10-13 DIAGNOSIS — Z96612 Presence of left artificial shoulder joint: Secondary | ICD-10-CM | POA: Diagnosis not present

## 2021-10-18 DIAGNOSIS — Z96612 Presence of left artificial shoulder joint: Secondary | ICD-10-CM | POA: Diagnosis not present

## 2021-10-18 DIAGNOSIS — M19012 Primary osteoarthritis, left shoulder: Secondary | ICD-10-CM | POA: Diagnosis not present

## 2021-10-23 DIAGNOSIS — Z96612 Presence of left artificial shoulder joint: Secondary | ICD-10-CM | POA: Diagnosis not present

## 2021-10-23 DIAGNOSIS — Z5189 Encounter for other specified aftercare: Secondary | ICD-10-CM | POA: Diagnosis not present

## 2021-10-23 DIAGNOSIS — M542 Cervicalgia: Secondary | ICD-10-CM | POA: Diagnosis not present

## 2021-11-20 DIAGNOSIS — Z96612 Presence of left artificial shoulder joint: Secondary | ICD-10-CM | POA: Diagnosis not present

## 2021-11-21 ENCOUNTER — Other Ambulatory Visit: Payer: Self-pay | Admitting: Family Medicine

## 2021-11-21 DIAGNOSIS — E785 Hyperlipidemia, unspecified: Secondary | ICD-10-CM

## 2021-12-18 ENCOUNTER — Telehealth: Payer: Self-pay | Admitting: Family Medicine

## 2021-12-18 NOTE — Telephone Encounter (Signed)
Left message for patient to call back and schedule Medicare Annual Wellness Visit (AWV) either virtually or in office. Left  my Herbie Drape number [phone]   Last AWV ;12/20/20  please schedule at anytime with [facility] Nurse Health Advisor 1 or 2

## 2022-01-01 DIAGNOSIS — Z96612 Presence of left artificial shoulder joint: Secondary | ICD-10-CM | POA: Diagnosis not present

## 2022-01-02 ENCOUNTER — Ambulatory Visit: Payer: PPO

## 2022-01-04 ENCOUNTER — Ambulatory Visit (INDEPENDENT_AMBULATORY_CARE_PROVIDER_SITE_OTHER): Payer: PPO

## 2022-01-04 VITALS — BP 120/60 | HR 76 | Temp 97.6°F | Ht 67.0 in | Wt 153.2 lb

## 2022-01-04 DIAGNOSIS — Z0000 Encounter for general adult medical examination without abnormal findings: Secondary | ICD-10-CM

## 2022-01-04 DIAGNOSIS — Z23 Encounter for immunization: Secondary | ICD-10-CM

## 2022-01-04 NOTE — Patient Instructions (Addendum)
Miguel Vazquez , Thank you for taking time to come for your Medicare Wellness Visit. I appreciate your ongoing commitment to your health goals. Please review the following plan we discussed and let me know if I can assist you in the future.   These are the goals we discussed:  Goals       Increase activity (pt-stated)      Patient Stated      [place]. Built a ramp and need to take off!  Try to make your walks!       Patient Stated      Start walking at the park        This is a list of the screening recommended for you and due dates:  Health Maintenance  Topic Date Due   Yearly kidney health urinalysis for diabetes  01/05/2022*   COVID-19 Vaccine (5 - Pfizer series) 01/20/2022*   Zoster (Shingles) Vaccine (1 of 2) 04/06/2022*   Complete foot exam   02/20/2022   Hemoglobin A1C  03/01/2022   Eye exam for diabetics  06/13/2022   Yearly kidney function blood test for diabetes  09/13/2022   Tetanus Vaccine  02/15/2029   Pneumonia Vaccine  Completed   Flu Shot  Completed   Hepatitis C Screening: USPSTF Recommendation to screen - Ages 18-79 yo.  Completed   HPV Vaccine  Aged Out  *Topic was postponed. The date shown is not the original due date.   Opioid Pain Medicine Management Opioids are powerful medicines that are used to treat moderate to severe pain. When used for short periods of time, they can help you to: Sleep better. Do better in physical or occupational therapy. Feel better in the first few days after an injury. Recover from surgery. Opioids should be taken with the supervision of a trained health care provider. They should be taken for the shortest period of time possible. This is because opioids can be addictive, and the longer you take opioids, the greater your risk of addiction. This addiction can also be called opioid use disorder. What are the risks? Using opioid pain medicines for longer than 3 days increases your  risk of side effects. Side effects include: Constipation. Nausea and vomiting. Breathing difficulties (respiratory depression). Drowsiness. Confusion. Opioid use disorder. Itching. Taking opioid pain medicine for a long period of time can affect your ability to do daily tasks. It also puts you at risk for: Motor vehicle crashes. Depression. Suicide. Heart attack. Overdose, which can be life-threatening. What is a pain treatment plan? A pain treatment plan is an agreement between you and your health care provider. Pain is unique to each person, and treatments vary depending on your condition. To manage your pain, you and your health care provider need to work together. To help you do this: Discuss the goals of your treatment, including how much pain you might expect to have and how you will manage the pain. Review the risks and benefits of taking opioid medicines. Remember that a good treatment plan uses more than one approach and minimizes the chance of side effects. Be honest about the amount of medicines you take and about any drug or alcohol use. Get pain medicine prescriptions from only one health care provider. Pain can be managed with many types of alternative treatments. Ask your health care provider to refer you to one or more specialists who can help you manage pain through: Physical or occupational therapy. Counseling (  cognitive behavioral therapy). Good nutrition. Biofeedback. Massage. Meditation. Non-opioid medicine. Following a gentle exercise program. How to use opioid pain medicine Taking medicine Take your pain medicine exactly as told by your health care provider. Take it only when you need it. If your pain gets less severe, you may take less than your prescribed dose if your health care provider approves. If you are not having pain, do nottake pain medicine unless your health care provider tells you to take it. If your pain is severe, do nottry to treat it  yourself by taking more pills than instructed on your prescription. Contact your health care provider for help. Write down the times when you take your pain medicine. It is easy to become confused while on pain medicine. Writing the time can help you avoid overdose. Take other over-the-counter or prescription medicines only as told by your health care provider. Keeping yourself and others safe  While you are taking opioid pain medicine: Do not drive, use machinery, or power tools. Do not sign legal documents. Do not drink alcohol. Do not take sleeping pills. Do not supervise children by yourself. Do not do activities that require climbing or being in high places. Do not go to a lake, river, ocean, spa, or swimming pool. Do not share your pain medicine with anyone. Keep pain medicine in a locked cabinet or in a secure area where pets and children cannot reach it. Stopping your use of opioids If you have been taking opioid medicine for more than a few weeks, you may need to slowly decrease (taper) how much you take until you stop completely. Tapering your use of opioids can decrease your risk of symptoms of withdrawal, such as: Pain and cramping in the abdomen. Nausea. Sweating. Sleepiness. Restlessness. Uncontrollable shaking (tremors). Cravings for the medicine. Do not attempt to taper your use of opioids on your own. Talk with your health care provider about how to do this. Your health care provider may prescribe a step-down schedule based on how much medicine you are taking and how long you have been taking it. Getting rid of leftover pills Do not save any leftover pills. Get rid of leftover pills safely by: Taking the medicine to a prescription take-back program. This is usually offered by the county or law enforcement. Bringing them to a pharmacy that has a drug disposal container. Flushing them down the toilet. Check the label or package insert of your medicine to see whether this  is safe to do. Throwing them out in the trash. Check the label or package insert of your medicine to see whether this is safe to do. If it is safe to throw it out, remove the medicine from the original container, put it into a sealable bag or container, and mix it with used coffee grounds, food scraps, dirt, or cat litter before putting it in the trash. Follow these instructions at home: Activity Do exercises as told by your health care provider. Avoid activities that make your pain worse. Return to your normal activities as told by your health care provider. Ask your health care provider what activities are safe for you. General instructions You may need to take these actions to prevent or treat constipation: Drink enough fluid to keep your urine pale yellow. Take over-the-counter or prescription medicines. Eat foods that are high in fiber, such as beans, whole grains, and fresh fruits and vegetables. Limit foods that are high in fat and processed sugars, such as fried or sweet foods. Keep all  follow-up visits. This is important. Where to find support If you have been taking opioids for a long time, you may benefit from receiving support for quitting from a local support group or counselor. Ask your health care provider for a referral to these resources in your area. Where to find more information Centers for Disease Control and Prevention (CDC): http://www.wolf.info/ [place] Food and Drug Administration (FDA): [place] Get help right away if: You may have taken too much of an opioid (overdosed). Common symptoms of an overdose: Your breathing is slower or more shallow than normal. You have a very slow heartbeat (pulse). You have slurred speech. You have nausea and vomiting. Your pupils become very small. You have other potential symptoms: You are very confused. You faint or feel like you will faint. You have cold, clammy skin. You have blue lips or fingernails. You have thoughts of harming  yourself or harming others. These symptoms may represent a serious problem that is an emergency. Do not wait to see if the symptoms will go away. Get medical help right away. Call your local emergency services (911 in the [place] Do not drive yourself to the hospital.  If you ever feel like you may hurt yourself or others, or have thoughts about taking your own life, get help right away. Go to your nearest emergency department or: Call your local emergency services (911 in the [place] Call the [facility] 936 447 7221 in the [place] Call a suicide crisis helpline, such as the [place] at 220-848-2806 or 988 in the [place]. This is open 24 hours a day in the [place] Text the Crisis Text Line at 906-696-3367 (in the [place].). Summary Opioid medicines can help you manage moderate to severe pain for a short period of time. A pain treatment plan is an agreement between you and your health care provider. Discuss the goals of your treatment, including how much pain you might expect to have and how you will manage the pain. If you think that you or someone else may have taken too much of an opioid, get medical help right away. This information is not intended to replace advice given to you by your health care provider. Make sure you discuss any questions you have with your health care provider. Document Revised: 10/12/2020 Document Reviewed: 06/29/2020 Elsevier Patient Education  [place] directives: Copy in Chart  Conditions/risks identified: None  Next appointment: Follow up in one year for your annual wellness visit.   Preventive Care 55 Years and Older, Male  Preventive care refers to lifestyle choices and visits with your health care provider that can promote health and wellness. What does preventive care include? A yearly physical exam. This is also called an annual well check. Dental exams once or twice a year. Routine eye exams. Ask  your health care provider how often you should have your eyes checked. Personal lifestyle choices, including: Daily care of your teeth and gums. Regular physical activity. Eating a healthy diet. Avoiding tobacco and drug use. Limiting alcohol use. Practicing safe sex. Taking low doses of aspirin every day. Taking vitamin and mineral supplements as recommended by your health care provider. What happens during an annual well check? The services and screenings done by your health care provider during your annual well check will depend on your age, overall health, lifestyle risk factors, and family history of disease. Counseling  Your health care provider may ask you questions about your: Alcohol use. Tobacco use. Drug use.  Emotional well-being. Home and relationship well-being. Sexual activity. Eating habits. History of falls. Memory and ability to understand (cognition). Work and work Statistician. Screening  You may have the following tests or measurements: Height, weight, and BMI. Blood pressure. Lipid and cholesterol levels. These may be checked every 5 years, or more frequently if you are over 86 years old. Skin check. Lung cancer screening. You may have this screening every year starting at age 62 if you have a 30-pack-year history of smoking and currently smoke or have quit within the past 15 years. Fecal occult blood test (FOBT) of the stool. You may have this test every year starting at age 6. Flexible sigmoidoscopy or colonoscopy. You may have a sigmoidoscopy every 5 years or a colonoscopy every 10 years starting at age 37. Prostate cancer screening. Recommendations will vary depending on your family history and other risks. Hepatitis C blood test. Hepatitis B blood test. Sexually transmitted disease (STD) testing. Diabetes screening. This is done by checking your blood sugar (glucose) after you have not eaten for a while (fasting). You may have this done every 1-3  years. Abdominal aortic aneurysm (AAA) screening. You may need this if you are a current or former smoker. Osteoporosis. You may be screened starting at age 81 if you are at high risk. Talk with your health care provider about your test results, treatment options, and if necessary, the need for more tests. Vaccines  Your health care provider may recommend certain vaccines, such as: Influenza vaccine. This is recommended every year. Tetanus, diphtheria, and acellular pertussis (Tdap, Td) vaccine. You may need a Td booster every 10 years. Zoster vaccine. You may need this after age 67. Pneumococcal 13-valent conjugate (PCV13) vaccine. One dose is recommended after age 97. Pneumococcal polysaccharide (PPSV23) vaccine. One dose is recommended after age 34. Talk to your health care provider about which screenings and vaccines you need and how often you need them. This information is not intended to replace advice given to you by your health care provider. Make sure you discuss any questions you have with your health care provider. Document Released: 04/15/2015 Document Revised: 12/07/2015 Document Reviewed: 01/18/2015 Elsevier Interactive Patient Education  2017 [place] Prevention in the Home Falls can cause injuries. They can happen to people of all ages. There are many things you can do to make your home safe and to help prevent falls. What can I do on the outside of my home? Regularly fix the edges of walkways and driveways and fix any cracks. Remove anything that might make you trip as you walk through a door, such as a raised step or threshold. Trim any bushes or trees on the path to your home. Use bright outdoor lighting. Clear any walking paths of anything that might make someone trip, such as rocks or tools. Regularly check to see if handrails are loose or broken. Make sure that both sides of any steps have handrails. Any raised decks and porches should have guardrails on the  edges. Have any leaves, snow, or ice cleared regularly. Use sand or salt on walking paths during winter. Clean up any spills in your garage right away. This includes oil or grease spills. What can I do in the bathroom? Use night lights. Install grab bars by the toilet and in the tub and shower. Do not use towel bars as grab bars. Use non-skid mats or decals in the tub or shower. If you need to sit down in the shower, use a  plastic, non-slip stool. Keep the floor dry. Clean up any water that spills on the floor as soon as it happens. Remove soap buildup in the tub or shower regularly. Attach bath mats securely with double-sided non-slip rug tape. Do not have throw rugs and other things on the floor that can make you trip. What can I do in the bedroom? Use night lights. Make sure that you have a light by your bed that is easy to reach. Do not use any sheets or blankets that are too big for your bed. They should not hang down onto the floor. Have a firm chair that has side arms. You can use this for support while you get dressed. Do not have throw rugs and other things on the floor that can make you trip. What can I do in the kitchen? Clean up any spills right away. Avoid walking on wet floors. Keep items that you use a lot in easy-to-reach places. If you need to reach something above you, use a strong step stool that has a grab bar. Keep electrical cords out of the way. Do not use floor polish or wax that makes floors slippery. If you must use wax, use non-skid floor wax. Do not have throw rugs and other things on the floor that can make you trip. What can I do with my stairs? Do not leave any items on the stairs. Make sure that there are handrails on both sides of the stairs and use them. Fix handrails that are broken or loose. Make sure that handrails are as long as the stairways. Check any carpeting to make sure that it is firmly attached to the stairs. Fix any carpet that is loose or  worn. Avoid having throw rugs at the top or bottom of the stairs. If you do have throw rugs, attach them to the floor with carpet tape. Make sure that you have a light switch at the top of the stairs and the bottom of the stairs. If you do not have them, ask someone to add them for you. What else can I do to help prevent falls? Wear shoes that: Do not have high heels. Have rubber bottoms. Are comfortable and fit you well. Are closed at the toe. Do not wear sandals. If you use a stepladder: Make sure that it is fully opened. Do not climb a closed stepladder. Make sure that both sides of the stepladder are locked into place. Ask someone to hold it for you, if possible. Clearly mark and make sure that you can see: Any grab bars or handrails. First and last steps. Where the edge of each step is. Use tools that help you move around (mobility aids) if they are needed. These include: Canes. Walkers. Scooters. Crutches. Turn on the lights when you go into a dark area. Replace any light bulbs as soon as they burn out. Set up your furniture so you have a clear path. Avoid moving your furniture around. If any of your floors are uneven, fix them. If there are any pets around you, be aware of where they are. Review your medicines with your doctor. Some medicines can make you feel dizzy. This can increase your chance of falling. Ask your doctor what other things that you can do to help prevent falls. This information is not intended to replace advice given to you by your health care provider. Make sure you discuss any questions you have with your health care provider. Document Released: 01/13/2009 Document Revised: 08/25/2015 Document  Reviewed: 04/23/2014 Elsevier Interactive Patient Education  2017 [employer].

## 2022-01-04 NOTE — Progress Notes (Signed)
Subjective:   Miguel Vazquez is a 81 y.o. male who presents for Medicare Annual/Subsequent preventive examination.  Review of Systems      Cardiac Risk Factors include: advanced age (>62mn, >>20women);diabetes mellitus;hypertension;male gender     Objective:    Today's Vitals   01/04/22 1340  BP: 120/60  Pulse: 76  Temp: 97.6 F (36.4 C)  TempSrc: Oral  SpO2: 93%  Weight: 153 lb 3.2 oz (69.5 kg)  Height: '5\' 7"'$  (1.702 m)   Body mass index is 23.99 kg/m.     01/04/2022    1:54 PM 09/12/2021   11:23 AM 12/20/2020    3:16 PM 11/25/2019    1:25 PM 06/24/2019    6:32 PM 02/16/2019    3:42 PM 09/11/2017   11:47 AM  Advanced Directives  Does Patient Have a Medical Advance Directive? Yes Yes Yes Yes Yes Yes Yes  Type of AParamedicof AAttu StationLiving will Living will;Healthcare Power of AMillerLiving will HHeflinLiving will HValley CenterLiving will   Does patient want to make changes to medical advance directive? No - Patient declined   No - Patient declined     Copy of HZephyrhillsin Chart? Yes - validated most recent copy scanned in chart (See row information)  Yes - validated most recent copy scanned in chart (See row information) Yes - validated most recent copy scanned in chart (See row information)  No - copy requested     Current Medications (verified) Outpatient Encounter Medications as of 01/04/2022  Medication Sig   aspirin EC 81 MG tablet Take 1 tablet (81 mg total) by mouth daily.   atorvastatin (LIPITOR) 80 MG tablet TAKE 1 TABLET BY MOUTH DAILY AT 6 PM.   carvedilol (COREG) 6.25 MG tablet TAKE 1 TABLET BY MOUTH TWICE A DAY   donepezil (ARICEPT) 10 MG tablet TAKE 1 TABLET BY MOUTH EVERYDAY AT BEDTIME   EPINEPHrine 0.3 mg/0.3 mL IJ SOAJ injection Inject 0.3 mLs (0.3 mg total) into the muscle as needed for anaphylaxis.   escitalopram  (LEXAPRO) 10 MG tablet TAKE 1 TABLET BY MOUTH EVERY DAY   ferrous sulfate 324 MG TBEC Take 324 mg by mouth daily.   glucose blood (FREESTYLE LITE) test strip Check Blood Sugars 1-2 times per day. DX: E11.9.   HYDROcodone-acetaminophen (NORCO/VICODIN) 5-325 MG tablet Take 1 tablet by mouth every 6 (six) hours as needed for moderate pain.   Lancets (FREESTYLE) lancets Check Blood Sugars 1-2 times per day. DX: E11.9   levothyroxine (SYNTHROID) 75 MCG tablet TAKE 1 TABLET BY MOUTH EVERY DAY   losartan (COZAAR) 50 MG tablet TAKE 1 TABLET BY MOUTH EVERY DAY   metFORMIN (GLUCOPHAGE) 1000 MG tablet TAKE 1 TABLET BY MOUTH TWICE A DAY   Multiple Vitamin (MULTIVITAMIN) tablet Take 1 tablet by mouth daily. Centrium Silver once daily   ondansetron (ZOFRAN) 4 MG tablet Take 1 tablet (4 mg total) by mouth every 8 (eight) hours as needed for nausea or vomiting.   vitamin B-12 (CYANOCOBALAMIN) 1000 MCG tablet Take 1,000 mcg by mouth daily.   vitamin C (ASCORBIC ACID) 500 MG tablet Take 500 mg by mouth every evening.    No facility-administered encounter medications on file as of 01/04/2022.    Allergies (verified) Food, Shellfish allergy, Prednisone, Tramadol, and Venomil honey bee venom [honey bee venom]   History: Past Medical History:  Diagnosis Date  Acute blood loss anemia    Arthritis    Brain aneurysm 06-28-16   followed by Dr [name]   Cataract    Chronic kidney disease    kidney stone- 40 years ago   Coronary artery disease    Cyst of pancreas 07/23/2019   Diabetes mellitus    Diverticulitis    Gait abnormality 01/11/2021   GERD (gastroesophageal reflux disease)    History of kidney stones    Hx of abdominal aortic aneurysm 1998   Hyperlipidemia    Hypertension    Lower GI bleed    Memory loss    Myocardial infarction [facility])    June 29, 2014   Personal history of colonic polyps - adenomas 09/09/2013   Past Surgical History:  Procedure Laterality Date   ABDOMINAL AORTIC ANEURYSM REPAIR  1998    CARDIAC CATHETERIZATION N/A 03/18/2015   Procedure: Left Heart Cath and Coronary Angiography;  Surgeon: Peter M Martinique, MD;  Location: [place] [facility];  Service: Cardiovascular;  Laterality: N/A;   CATARACT EXTRACTION Right    COLONOSCOPY     CORONARY ARTERY BYPASS GRAFT N/A 03/18/2015   Procedure: CORONARY ARTERY BYPASS GRAFTING (CABG) x  three, using left internal mammary artery and right leg greater saphenous vein harvested endoscopically;  Surgeon: Melrose Nakayama, MD;  Location: [place];  Service: Open Heart Surgery;  Laterality: N/A;   INGUINAL HERNIA REPAIR Bilateral    INTRAOPERATIVE TRANSESOPHAGEAL ECHOCARDIOGRAM N/A 03/18/2015   Procedure: INTRAOPERATIVE TRANSESOPHAGEAL ECHOCARDIOGRAM;  Surgeon: Melrose Nakayama, MD;  Location: [place];  Service: Open Heart Surgery;  Laterality: N/A;   REVERSE SHOULDER ARTHROPLASTY Left 09/21/2021   Procedure: REVERSE SHOULDER ARTHROPLASTY;  Surgeon: Justice Britain, MD;  [facility];  Service: Orthopedics;  Laterality: Left;  154MGQ   UMBILICAL HERNIA REPAIR  1965   Family History  Problem Relation Age of Onset   Bone cancer Mother    Diabetes Paternal Aunt    Stroke Son    Down syndrome Son    Colon cancer Neg Hx    Esophageal cancer Neg Hx    Rectal cancer Neg Hx    Stomach cancer Neg Hx    Social History   Socioeconomic History   Marital status: Married    Spouse name: [name]   Number of children: 3   Years of education: 10   Highest education level: 10th grade  Occupational History   Not on file  Tobacco Use   Smoking status: Former    Packs/day: 1.50    Years: 20.00    Total pack years: 30.00    Types: Cigarettes    Quit date: 08/24/1986    Years since quitting: 35.3   Smokeless tobacco: Never  Vaping Use   Vaping Use: Never used  Substance and Sexual Activity   Alcohol use: Not Currently    Comment: occ   Drug use: No   Sexual activity: Not on file  Other Topics Concern   Not on file  Social History  Narrative   07/17/21 Married and lives with wife in 71 story home.  Retired.      Adult son with Down syndrome died in 29-Jun-2018.  2 other children living.   Social Determinants of Health   Financial Resource Strain: Low Risk  (01/04/2022)   Overall Financial Resource Strain (CARDIA)    Difficulty of Paying Living Expenses: Not hard at all  Food Insecurity: No Food Insecurity (01/04/2022)   Hunger Vital Sign    Worried About Running  Out of Food in the Last Year: Never true    Ran Out of Food in the Last Year: Never true  Transportation Needs: No Transportation Needs (01/04/2022)   PRAPARE - Hydrologist (Medical): No    Lack of Transportation (Non-Medical): No  Physical Activity: Inactive (01/04/2022)   Exercise Vital Sign    Days of Exercise per Week: 0 days    Minutes of Exercise per Session: 0 min  Stress: No Stress Concern Present (01/04/2022)   [place]    Feeling of Stress : Not at all  Social Connections: [employer] (01/04/2022)   Social Connection and Isolation Panel [NHANES]    Frequency of Communication with Friends and Family: More than three times a week    Frequency of Social Gatherings with Friends and Family: More than three times a week    Attends Religious Services: More than 4 times per year    Active Member of [employer] or Organizations: Yes    Attends Music therapist: More than 4 times per year    Marital Status: Married    Tobacco Counseling Counseling given: Not Answered   Clinical Intake:  Pre-visit preparation completed: No  Diabetic?  Yes   Interpreter Needed?: No  Information entered by :: Rolene Arbour LPN   Activities of Daily Living    01/04/2022    1:51 PM 09/12/2021   11:26 AM  In your present state of health, do you have any difficulty performing the following activities:  Hearing? 0   Vision? 0   Difficulty concentrating or  making decisions? 0   Walking or climbing stairs? 0   Dressing or bathing? 0   Doing errands, shopping? 0 0  Preparing Food and eating ? N   Using the Toilet? N   In the past six months, have you accidently leaked urine? Y   Comment Wears breifs. Followed by PCP   Do you have problems with loss of bowel control? N   Managing your Medications? N   Managing your Finances? N   Housekeeping or managing your Housekeeping? N     Patient Care Team: Eulas Post, MD as PCP - General Martinique, Peter M, MD as PCP - Cardiology (Cardiology) Michael Boston, MD as Consulting Physician (General Surgery) Gatha Mayer, MD as Consulting Physician (Gastroenterology) Martinique, Peter M, MD as Consulting Physician (Cardiology) Melrose Nakayama, MD as Consulting Physician (Cardiothoracic Surgery) Earnie Larsson, [facility] as Pharmacist (Pharmacist)  Indicate any recent Medical Services you may have received from other than [facility] providers in the past year (date may be approximate).     Assessment:   This is a routine wellness examination for [place].  Hearing/Vision screen Hearing Screening - Comments:: Denies hearing difficulties   Vision Screening - Comments:: Wears reading glasses - up to date with routine eye exams with  [facility]  Dietary issues and exercise activities discussed: Current Exercise Habits: The patient does not participate in regular exercise at present, Exercise limited by: None identified   Goals Addressed               This Visit's Progress     Increase activity (pt-stated)         Depression Screen    01/04/2022    1:49 PM 05/10/2021    3:51 PM 02/20/2021   11:39 AM 12/20/2020    3:15 PM 02/22/2020    4:00 PM 11/25/2019  1:29 PM 09/15/2019    1:14 PM  PHQ 2/9 Scores  PHQ - 2 Score 0 3 0 0 0 0 0  PHQ- 9 Score  6    0     Fall Risk    01/04/2022    1:52 PM 08/29/2021   10:19 AM 07/17/2021    2:10 PM 05/10/2021    3:51 PM 02/20/2021   11:39 AM  Fall  Risk   Falls in the past year? '1 1 1 '$ 0 0  Number falls in past yr: 0 1 1    Injury with Fall? 0 1 0    Comment No injury or medical attention needed      Risk for fall due to : No Fall Risks No Fall Risks     Follow up  Falls evaluation completed       FALL RISK PREVENTION PERTAINING TO THE HOME:  Any stairs in or around the home? Yes  If so, are there any without handrails? No  Home free of loose throw rugs in walkways, pet beds, electrical cords, etc? Yes  Adequate lighting in your home to reduce risk of falls? Yes   ASSISTIVE DEVICES UTILIZED TO PREVENT FALLS:  Life alert? No  Use of a cane, walker or w/c? Yes  Grab bars in the bathroom?  Shower chair or bench in shower? Yes  Elevated toilet seat or a handicapped toilet? Yes   TIMED UP AND GO:  Was the test performed? Yes .  Length of time to ambulate 10 feet: 10 sec.   Gait slow and steady without use of assistive device  Cognitive Function:    07/17/2021    2:12 PM 01/11/2021   11:25 AM 07/01/2020   11:45 AM  MMSE - Mini Mental State Exam  Orientation to time '2 3 2  '$ Orientation to Place '4 4 3  '$ Registration '3 3 3  '$ Attention/ Calculation '1 2 1  '$ Recall '3 2 2  '$ Language- name 2 objects '2 2 2  '$ Language- repeat 0 1 1  Language- follow 3 step command '3 3 3  '$ Language- read & follow direction '1 1 1  '$ Write a sentence '1 1 1  '$ Copy design 1 0 0  Total score '21 22 19        '$ 01/04/2022    1:54 PM 12/20/2020    3:26 PM 11/25/2019    1:31 PM  6CIT Screen  What Year? 4 points 0 points 0 points  What month? 0 points 3 points 0 points  What time? 0 points 0 points 0 points  Count back from 20 2 points 0 points 2 points  Months in reverse 4 points 4 points 4 points  Repeat phrase 4 points 10 points 10 points  Total Score 14 points 17 points 16 points    Immunizations Immunization History  Administered Date(s) Administered   Fluad Quad(high Dose 65+) 12/25/2018, 12/25/2018, 02/20/2021, 01/04/2022   Influenza Split  01/15/2011   Influenza, High Dose Seasonal PF 12/12/2016, 02/06/2018   Influenza,inj,Quad PF,6+ Mos 11/18/2013, 01/07/2016, 12/16/2019   Influenza-Unspecified 02/06/2018   PFIZER Comirnaty(Gray Top)Covid-19 Tri-Sucrose Vaccine 09/05/2020   PFIZER(Purple Top)SARS-COV-2 Vaccination 05/31/2019, 06/30/2019, 01/21/2020   Pneumococcal Conjugate-13 05/21/2014   Pneumococcal Polysaccharide-23 11/18/2012   Tdap 02/16/2019   Zoster, Live 05/21/2014    TDAP status: Up to date  Flu Vaccine status: Completed at today's visit  Pneumococcal vaccine status: Up to date  Covid-19 vaccine status: Completed vaccines  Qualifies for Shingles Vaccine? Yes  Zostavax completed No   Shingrix Completed?: No.    Education has been provided regarding the importance of this vaccine. Patient has been advised to call insurance company to determine out of pocket expense if they have not yet received this vaccine. Advised may also receive vaccine at local pharmacy or Health Dept. Verbalized acceptance and understanding.  Screening Tests Health Maintenance  Topic Date Due   Diabetic kidney evaluation - Urine ACR  01/05/2022 (Originally 11/22/2010)   COVID-19 Vaccine (5 - Pfizer series) 01/20/2022 (Originally 10/31/2020)   Zoster Vaccines- Shingrix (1 of 2) 04/06/2022 (Originally 01/24/1992)   FOOT EXAM  02/20/2022   HEMOGLOBIN A1C  03/01/2022   OPHTHALMOLOGY EXAM  06/13/2022   Diabetic kidney evaluation - GFR measurement  09/13/2022   TETANUS/TDAP  02/15/2029   Pneumonia Vaccine 14+ Years old  Completed   INFLUENZA VACCINE  Completed   Hepatitis C Screening  Completed   HPV VACCINES  Aged Out    Health Maintenance  There are no preventive care reminders to display for this patient.   Colorectal cancer screening: No longer required.   Lung Cancer Screening: (Low Dose CT Chest recommended if Age 33-80 years, 30 pack-year currently smoking OR have quit w/in 15years.) does not qualify.     Additional  Screening:  Hepatitis C Screening: does qualify; Completed 01/11/21  Vision Screening: Recommended annual ophthalmology exams for early detection of glaucoma and other disorders of the eye. Is the patient up to date with their annual eye exam?  Yes  Who is the provider or what is the name of the office in which the patient attends annual eye exams? [facility] If pt is not established with a provider, would they like to be referred to a provider to establish care? No .   Dental Screening: Recommended annual dental exams for proper oral hygiene  Community Resource Referral / Chronic Care Management:  CRR required this visit?  No   CCM required this visit?  No      Plan:     I have personally reviewed and noted the following in the patient's chart:   Medical and social history Use of alcohol, tobacco or illicit drugs  Current medications and supplements including opioid prescriptions. Patient is currently taking opioid prescriptions. Information provided to patient regarding non-opioid alternatives. Patient advised to discuss non-opioid treatment plan with their provider. Functional ability and status Nutritional status Physical activity Advanced directives List of other physicians Hospitalizations, surgeries, and ER visits in previous 12 months Vitals Screenings to include cognitive, depression, and falls Referrals and appointments  In addition, I have reviewed and discussed with patient certain preventive protocols, quality metrics, and best practice recommendations. A written personalized care plan for preventive services as well as general preventive health recommendations were provided to patient.     Criselda Peaches, LPN   [id number]   Nurse Notes: Patient due Diabetic Kidney Evaluation- Urine ACR

## 2022-01-15 DIAGNOSIS — Z6824 Body mass index (BMI) 24.0-24.9, adult: Secondary | ICD-10-CM | POA: Diagnosis not present

## 2022-01-15 DIAGNOSIS — R419 Unspecified symptoms and signs involving cognitive functions and awareness: Secondary | ICD-10-CM | POA: Diagnosis not present

## 2022-01-15 DIAGNOSIS — G319 Degenerative disease of nervous system, unspecified: Secondary | ICD-10-CM | POA: Diagnosis not present

## 2022-01-17 DIAGNOSIS — R42 Dizziness and giddiness: Secondary | ICD-10-CM | POA: Diagnosis not present

## 2022-01-17 DIAGNOSIS — R2681 Unsteadiness on feet: Secondary | ICD-10-CM | POA: Diagnosis not present

## 2022-01-17 DIAGNOSIS — M25512 Pain in left shoulder: Secondary | ICD-10-CM | POA: Diagnosis not present

## 2022-01-18 DIAGNOSIS — M25512 Pain in left shoulder: Secondary | ICD-10-CM | POA: Diagnosis not present

## 2022-01-18 DIAGNOSIS — R2681 Unsteadiness on feet: Secondary | ICD-10-CM | POA: Diagnosis not present

## 2022-01-18 DIAGNOSIS — R42 Dizziness and giddiness: Secondary | ICD-10-CM | POA: Diagnosis not present

## 2022-01-22 ENCOUNTER — Ambulatory Visit: Payer: PPO | Admitting: Diagnostic Neuroimaging

## 2022-01-22 ENCOUNTER — Telehealth: Payer: Self-pay | Admitting: Diagnostic Neuroimaging

## 2022-01-22 NOTE — Telephone Encounter (Signed)
Pt daughter is calling. Said Pt wife fell and is on the way to hospital, said she can't bring him to appointment today.

## 2022-01-25 DIAGNOSIS — M25512 Pain in left shoulder: Secondary | ICD-10-CM | POA: Diagnosis not present

## 2022-01-25 DIAGNOSIS — R42 Dizziness and giddiness: Secondary | ICD-10-CM | POA: Diagnosis not present

## 2022-01-25 DIAGNOSIS — R2681 Unsteadiness on feet: Secondary | ICD-10-CM | POA: Diagnosis not present

## 2022-02-01 DIAGNOSIS — R2681 Unsteadiness on feet: Secondary | ICD-10-CM | POA: Diagnosis not present

## 2022-02-01 DIAGNOSIS — M25512 Pain in left shoulder: Secondary | ICD-10-CM | POA: Diagnosis not present

## 2022-02-01 DIAGNOSIS — R42 Dizziness and giddiness: Secondary | ICD-10-CM | POA: Diagnosis not present

## 2022-02-05 DIAGNOSIS — R2681 Unsteadiness on feet: Secondary | ICD-10-CM | POA: Diagnosis not present

## 2022-02-05 DIAGNOSIS — M25512 Pain in left shoulder: Secondary | ICD-10-CM | POA: Diagnosis not present

## 2022-02-05 DIAGNOSIS — R42 Dizziness and giddiness: Secondary | ICD-10-CM | POA: Diagnosis not present

## 2022-02-08 DIAGNOSIS — M25512 Pain in left shoulder: Secondary | ICD-10-CM | POA: Diagnosis not present

## 2022-02-08 DIAGNOSIS — R42 Dizziness and giddiness: Secondary | ICD-10-CM | POA: Diagnosis not present

## 2022-02-08 DIAGNOSIS — R2681 Unsteadiness on feet: Secondary | ICD-10-CM | POA: Diagnosis not present

## 2022-02-12 DIAGNOSIS — R42 Dizziness and giddiness: Secondary | ICD-10-CM | POA: Diagnosis not present

## 2022-02-12 DIAGNOSIS — M25512 Pain in left shoulder: Secondary | ICD-10-CM | POA: Diagnosis not present

## 2022-02-12 DIAGNOSIS — R2681 Unsteadiness on feet: Secondary | ICD-10-CM | POA: Diagnosis not present

## 2022-02-15 DIAGNOSIS — R2681 Unsteadiness on feet: Secondary | ICD-10-CM | POA: Diagnosis not present

## 2022-02-15 DIAGNOSIS — R42 Dizziness and giddiness: Secondary | ICD-10-CM | POA: Diagnosis not present

## 2022-02-15 DIAGNOSIS — M25512 Pain in left shoulder: Secondary | ICD-10-CM | POA: Diagnosis not present

## 2022-02-17 ENCOUNTER — Other Ambulatory Visit: Payer: Self-pay | Admitting: Family Medicine

## 2022-02-17 DIAGNOSIS — E785 Hyperlipidemia, unspecified: Secondary | ICD-10-CM

## 2022-02-21 DIAGNOSIS — M25512 Pain in left shoulder: Secondary | ICD-10-CM | POA: Diagnosis not present

## 2022-02-21 DIAGNOSIS — R42 Dizziness and giddiness: Secondary | ICD-10-CM | POA: Diagnosis not present

## 2022-02-21 DIAGNOSIS — R2681 Unsteadiness on feet: Secondary | ICD-10-CM | POA: Diagnosis not present

## 2022-02-26 DIAGNOSIS — M25512 Pain in left shoulder: Secondary | ICD-10-CM | POA: Diagnosis not present

## 2022-02-26 DIAGNOSIS — R2681 Unsteadiness on feet: Secondary | ICD-10-CM | POA: Diagnosis not present

## 2022-02-26 DIAGNOSIS — R42 Dizziness and giddiness: Secondary | ICD-10-CM | POA: Diagnosis not present

## 2022-03-01 DIAGNOSIS — R42 Dizziness and giddiness: Secondary | ICD-10-CM | POA: Diagnosis not present

## 2022-03-01 DIAGNOSIS — R2681 Unsteadiness on feet: Secondary | ICD-10-CM | POA: Diagnosis not present

## 2022-03-01 DIAGNOSIS — M25512 Pain in left shoulder: Secondary | ICD-10-CM | POA: Diagnosis not present

## 2022-03-03 DIAGNOSIS — S20211A Contusion of right front wall of thorax, initial encounter: Secondary | ICD-10-CM | POA: Diagnosis not present

## 2022-03-03 DIAGNOSIS — S7001XA Contusion of right hip, initial encounter: Secondary | ICD-10-CM | POA: Diagnosis not present

## 2022-03-10 ENCOUNTER — Other Ambulatory Visit: Payer: Self-pay | Admitting: Family Medicine

## 2022-03-10 DIAGNOSIS — E1121 Type 2 diabetes mellitus with diabetic nephropathy: Secondary | ICD-10-CM

## 2022-03-18 ENCOUNTER — Other Ambulatory Visit: Payer: Self-pay | Admitting: Family Medicine

## 2022-03-21 ENCOUNTER — Other Ambulatory Visit: Payer: Self-pay | Admitting: Family Medicine

## 2022-03-21 NOTE — Telephone Encounter (Signed)
Pt's daughter called to FU on this refill request Losartan Potassium 25 mg Oral Daily   LOV:  08/29/21  [id number] [place] [place] - [address] [place] Phone: [phone] Fax: [phone]

## 2022-03-30 ENCOUNTER — Telehealth: Payer: Self-pay | Admitting: Family Medicine

## 2022-03-30 DIAGNOSIS — E1121 Type 2 diabetes mellitus with diabetic nephropathy: Secondary | ICD-10-CM

## 2022-03-30 MED ORDER — METFORMIN HCL 1000 MG PO TABS: 1000.0000 mg | ORAL_TABLET | Freq: Two times a day (BID) | ORAL | 1 refills | Status: DC

## 2022-03-30 MED ORDER — LOSARTAN POTASSIUM 25 MG PO TABS: 25.0000 mg | ORAL_TABLET | Freq: Every day | ORAL | 1 refills | Status: DC

## 2022-03-30 NOTE — Telephone Encounter (Signed)
Pt was last seen in May of 2023.  Pt's daughter is requesting refills of the following:  losartan (COZAAR) 50 MG tablet  (Daughter says this should be 25 mg)  metFORMIN (GLUCOPHAGE) 1000 MG tablet   Pt's daughter states pharmacy is telling her the losartan was denied.    [id number] [place] [place] - [address]: [phone] Fax: [phone]    Daughter states she can bring Pt in, if needed.   Please advise.

## 2022-03-30 NOTE — Telephone Encounter (Signed)
Rx's sent in. °

## 2022-04-10 ENCOUNTER — Ambulatory Visit: Payer: PPO | Admitting: Family Medicine

## 2022-04-11 ENCOUNTER — Encounter: Payer: Self-pay | Admitting: Family Medicine

## 2022-04-11 ENCOUNTER — Ambulatory Visit (INDEPENDENT_AMBULATORY_CARE_PROVIDER_SITE_OTHER): Payer: PPO | Admitting: Family Medicine

## 2022-04-11 VITALS — BP 106/60 | HR 64 | Temp 97.7°F | Ht 67.0 in | Wt 145.9 lb

## 2022-04-11 DIAGNOSIS — I10 Essential (primary) hypertension: Secondary | ICD-10-CM

## 2022-04-11 DIAGNOSIS — R531 Weakness: Secondary | ICD-10-CM

## 2022-04-11 DIAGNOSIS — E1121 Type 2 diabetes mellitus with diabetic nephropathy: Secondary | ICD-10-CM

## 2022-04-11 DIAGNOSIS — E039 Hypothyroidism, unspecified: Secondary | ICD-10-CM

## 2022-04-11 DIAGNOSIS — Z9181 History of falling: Secondary | ICD-10-CM

## 2022-04-11 DIAGNOSIS — R5383 Other fatigue: Secondary | ICD-10-CM

## 2022-04-11 DIAGNOSIS — E7800 Pure hypercholesterolemia, unspecified: Secondary | ICD-10-CM | POA: Diagnosis not present

## 2022-04-11 LAB — CBC WITH DIFFERENTIAL/PLATELET
Basophils Absolute: 0.1 10*3/uL (ref 0.0–0.1)
Basophils Relative: 0.6 % (ref 0.0–3.0)
Eosinophils Absolute: 0.5 10*3/uL (ref 0.0–0.7)
Eosinophils Relative: 6 % — ABNORMAL HIGH (ref 0.0–5.0)
HCT: 41.4 % (ref 39.0–52.0)
Hemoglobin: 14 g/dL (ref 13.0–17.0)
Lymphocytes Relative: 15.2 % (ref 12.0–46.0)
Lymphs Abs: 1.2 10*3/uL (ref 0.7–4.0)
MCHC: 33.7 g/dL (ref 30.0–36.0)
MCV: 96.6 fl (ref 78.0–100.0)
Monocytes Absolute: 0.6 10*3/uL (ref 0.1–1.0)
Monocytes Relative: 7.5 % (ref 3.0–12.0)
Neutro Abs: 5.6 10*3/uL (ref 1.4–7.7)
Neutrophils Relative %: 70.7 % (ref 43.0–77.0)
Platelets: 193 10*3/uL (ref 150.0–400.0)
RBC: 4.29 Mil/uL (ref 4.22–5.81)
RDW: 13.8 % (ref 11.5–15.5)
WBC: 7.9 10*3/uL (ref 4.0–10.5)

## 2022-04-11 LAB — LIPID PANEL
Cholesterol: 123 mg/dL (ref 0–200)
HDL: 49.6 mg/dL (ref >39.00)
LDL Cholesterol: 51 mg/dL (ref 0–99)
NonHDL: 73.09
Total CHOL/HDL Ratio: 2
Triglycerides: 109 mg/dL (ref 0.0–149.0)
VLDL: 21.8 mg/dL (ref 0.0–40.0)

## 2022-04-11 LAB — BASIC METABOLIC PANEL
BUN: 20 mg/dL (ref 6–23)
CO2: 32 mEq/L (ref 19–32)
Calcium: 10.2 mg/dL (ref 8.4–10.5)
Chloride: 102 mEq/L (ref 96–112)
Creatinine, Ser: 1 mg/dL (ref 0.40–1.50)
GFR: 71.23 mL/min (ref >60.00)
Glucose, Bld: 101 mg/dL — ABNORMAL HIGH (ref 70–99)
Potassium: 4.5 mEq/L (ref 3.5–5.1)
Sodium: 144 mEq/L (ref 135–145)

## 2022-04-11 LAB — HEPATIC FUNCTION PANEL
ALT: 19 U/L (ref 0–53)
AST: 19 U/L (ref 0–37)
Albumin: 4.3 g/dL (ref 3.5–5.2)
Alkaline Phosphatase: 84 U/L (ref 39–117)
Bilirubin, Direct: 0.2 mg/dL (ref 0.0–0.3)
Total Bilirubin: 0.8 mg/dL (ref 0.2–1.2)
Total Protein: 6.8 g/dL (ref 6.0–8.3)

## 2022-04-11 LAB — HEMOGLOBIN A1C: Hgb A1c MFr Bld: 6.5 % (ref 4.6–6.5)

## 2022-04-11 LAB — TSH: TSH: 2.75 u[IU]/mL (ref 0.35–5.50)

## 2022-04-11 NOTE — Progress Notes (Signed)
Established Patient Office Visit  Subjective   Patient ID: TOME WILSON, male    DOB: [birth year]  Age: 82 y.o. MRN: [id number]  Chief Complaint  Patient presents with   Follow-up    HPI   Mr. Tauzin is seen accompanied by his wife with chief complaint of general fatigue and generalized weakness with chronic but progressive balance issues.  No recent fall.  Has had extensive physical therapy in the past.  He and his wife currently live in their home in [place] and house of a [employer] that they are considering selling.  They are considering possible moved to friend's home where they could get additional assistance eventually  His chronic problems include history of hypertension, CAD, history of lower GI bleed, type 2 diabetes, hypothyroidism, hyperlipidemia, history of depression. Mood stable.  It is noted that he is lost about 9 pounds since his last visit here several months ago.  Eats generally 2 meals per day.  Denies any chest pains.  Sleep is okay.  No dysuria.  No abdominal pain.  No cough or dyspnea.  No recent stool changes.  Denies orthostatic symptoms.  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   Diabetes mellitus    Diverticulitis    Gait abnormality 01/11/2021   GERD (gastroesophageal reflux disease)    History of kidney stones    Hx of abdominal aortic aneurysm 1998   Hyperlipidemia    Hypertension    Lower GI bleed    Memory loss    Myocardial infarction ([place])    2016   Personal history of colonic polyps - adenomas 09/09/2013   Past Surgical History:  Procedure Laterality Date   ABDOMINAL AORTIC ANEURYSM REPAIR  1998   CARDIAC CATHETERIZATION N/A 03/18/2015   Procedure: Left Heart Cath and Coronary Angiography;  Surgeon: Peter M Martinique, MD;   Location: [place] [facility];  Service: Cardiovascular;  Laterality: N/A;   CATARACT EXTRACTION Right    COLONOSCOPY     CORONARY ARTERY BYPASS GRAFT N/A 03/18/2015   Procedure: CORONARY ARTERY BYPASS GRAFTING (CABG) x  three, using left internal mammary artery and right leg greater saphenous vein harvested endoscopically;  Surgeon: Melrose Nakayama, MD;  Location: [place];  Service: Open Heart Surgery;  Laterality: N/A;   INGUINAL HERNIA REPAIR Bilateral    INTRAOPERATIVE TRANSESOPHAGEAL ECHOCARDIOGRAM N/A 03/18/2015   Procedure: INTRAOPERATIVE TRANSESOPHAGEAL ECHOCARDIOGRAM;  Surgeon: Melrose Nakayama, MD;  Location: [place];  Service: Open Heart Surgery;  Laterality: N/A;   REVERSE SHOULDER ARTHROPLASTY Left 09/21/2021   Procedure: REVERSE SHOULDER ARTHROPLASTY;  Surgeon: Justice Britain, MD;  [facility];  Service: Orthopedics;  Laterality: Left;  [id number]   UMBILICAL HERNIA REPAIR  1965    reports that he quit smoking about 35 years ago. His smoking use included cigarettes. He has a 30.00 pack-year smoking history. He has never used smokeless tobacco. He reports that he does not currently use alcohol. He reports that he does not use drugs. family history includes Bone cancer in his mother; Diabetes in his paternal aunt; Down syndrome in his son; Stroke in his son. Allergies  Allergen Reactions   Food Anaphylaxis    TREE NUTS  Shellfish Allergy Anaphylaxis   Prednisone     "psychosis"   Tramadol Other (See Comments)    hallucinations   Venomil Honey Bee Venom [Honey Bee Venom] Swelling    Localized swelling    Review of Systems  Constitutional:  Positive for malaise/fatigue and weight loss. Negative for chills and fever.  Respiratory:  Negative for cough and shortness of breath.   Cardiovascular:  Negative for chest pain.  Gastrointestinal:  Negative for abdominal pain and blood in stool.  Genitourinary:  Negative for dysuria.  Neurological:  Positive for weakness. Negative  for dizziness, speech change, focal weakness, seizures, loss of consciousness and headaches.  Psychiatric/Behavioral:  Negative for depression. The patient does not have insomnia.       Objective:     BP 106/60 (BP Location: Left Arm, Patient Position: Sitting, Cuff Size: Normal)   Pulse 64   Temp 97.7 F (36.5 C) (Oral)   Ht '5\' 7"'$  (1.702 m)   Wt 145 lb 14.4 oz (66.2 kg)   SpO2 96%   BMI 22.85 kg/m    Physical Exam Vitals reviewed.  Constitutional:      General: He is not in acute distress.    Appearance: Normal appearance.  Cardiovascular:     Rate and Rhythm: Normal rate and regular rhythm.  Pulmonary:     Effort: Pulmonary effort is normal.     Breath sounds: Normal breath sounds.  Abdominal:     Palpations: Abdomen is soft.     Tenderness: There is no abdominal tenderness.  Musculoskeletal:     Right lower leg: No edema.     Left lower leg: No edema.  Skin:    Findings: No rash.  Neurological:     General: No focal deficit present.     Mental Status: He is alert.     Comments: Generalized weakness but no focal weakness.  He does have obvious balance difficulties and transferring from table back to chair and changing positions      No results found for any visits on 04/11/22.    The ASCVD Risk score (Arnett DK, et al., 2019) failed to calculate for the following reasons:   The 2019 ASCVD risk score is only valid for ages 76 to 55   The patient has a prior MI or stroke diagnosis    Assessment & Plan:   Problem List Items Addressed This Visit       Unprioritized   Essential hypertension   Type 2 diabetes mellitus with diabetic nephropathy, without long-term current use of insulin ([place])   Relevant Orders   Hemoglobin A1c   Hypothyroidism   Relevant Orders   TSH   Hyperlipidemia   Relevant Orders   Lipid panel   Hepatic function panel   Other Visit Diagnoses     Fatigue, unspecified type    -  Primary   Relevant Orders   Basic metabolic panel    CBC with Differential/Platelet     Multiple chronic problems as above.  Presenting with some generalized weakness and fatigue.  Had some modest weight loss since last visit.  Denies depression.  Chronic balance issues and very high risk for falls.  Currently ambulating with cane.  We suggested the following  -Follow-up labs as above -Set up outpatient physical therapy for balance training and fall risk reduction -Continue cane use at all times and may need to step up to walker use soon -Discussed healthy calorie supplementation -Set up 13-monthfollow-up  Return in about  3 months (around 07/11/2022).    Carolann Littler, MD

## 2022-04-11 NOTE — Patient Instructions (Signed)
I will be setting up outpatient physical therapy.

## 2022-04-24 DIAGNOSIS — M6281 Muscle weakness (generalized): Secondary | ICD-10-CM | POA: Diagnosis not present

## 2022-04-24 DIAGNOSIS — R42 Dizziness and giddiness: Secondary | ICD-10-CM | POA: Diagnosis not present

## 2022-05-17 ENCOUNTER — Other Ambulatory Visit: Payer: Self-pay | Admitting: Family Medicine

## 2022-05-17 DIAGNOSIS — E785 Hyperlipidemia, unspecified: Secondary | ICD-10-CM

## 2022-07-17 ENCOUNTER — Encounter: Payer: Self-pay | Admitting: Diagnostic Neuroimaging

## 2022-07-17 ENCOUNTER — Ambulatory Visit: Payer: PPO | Admitting: Diagnostic Neuroimaging

## 2022-07-17 VITALS — BP 122/75 | HR 79 | Ht 65.0 in | Wt 145.8 lb

## 2022-07-17 DIAGNOSIS — R413 Other amnesia: Secondary | ICD-10-CM | POA: Diagnosis not present

## 2022-07-17 DIAGNOSIS — R269 Unspecified abnormalities of gait and mobility: Secondary | ICD-10-CM | POA: Diagnosis not present

## 2022-07-17 DIAGNOSIS — G629 Polyneuropathy, unspecified: Secondary | ICD-10-CM

## 2022-07-17 NOTE — Progress Notes (Signed)
[facility]  PATIENT: Miguel Vazquez DOB: 1940/05/25  REFERRING CLINICIAN: Kristian Covey, MD HISTORY FROM: patient REASON FOR VISIT: follow up   HISTORICAL  CHIEF COMPLAINT:  Chief Complaint  Patient presents with   Follow-up    Patient in room #7 with his wife. Patient states he having trouble with his balance and having dizziness lately.    HISTORY OF PRESENT ILLNESS:   UPDATE (07/17/22, VRP): Since last visit, more gait and balance issues. Walking more slowly. More falls. Symptoms are progressive. No alleviating or aggravating factors.   UPDATE (07/17/21, VRP): Since last visit, doing about the same. Mild memory issues since past 1-2 years. Some changes in ADLs (occ gets lost driving, but usually with wife to help). 10th grade education. Also with some neuropathy (unexplained). Some balance issues. Wife also having some memory loss issues.  PRIOR HPI (01/11/21, Dr. Anne Hahn): Miguel Vazquez is a 82 year old right-handed white male with a history of a memory disorder, he is currently on Aricept and tolerates this well.  He has had some slowness of gait that has developed over the last year or so.  The patient does have a history of diabetes.  On the prior examination, he was noted to have sensory alteration in the feet consistent with a peripheral neuropathy.  The patient reports no burning or stinging in the feet.  He did fall recently, he landed on his buttocks, he did have an x-ray that did not show evidence of a fracture.  He has not had any other falls.  He denies issues controlling the bowels or the bladder.  He has not had any further episodes of confusion.  He returns to the office today for an evaluation.  He claims that he sleeps fairly well at night.    REVIEW OF SYSTEMS: Full 14 system review of systems performed and negative with exception of: as per HPI.  ALLERGIES: Allergies  Allergen Reactions   Food Anaphylaxis    TREE NUTS   Shellfish Allergy  Anaphylaxis   Prednisone     "psychosis"   Tramadol Other (See Comments)    hallucinations   Venomil Honey Bee Venom [Honey Bee Venom] Swelling    Localized swelling    HOME MEDICATIONS: Outpatient Medications Prior to Visit  Medication Sig Dispense Refill   aspirin EC 81 MG tablet Take 1 tablet (81 mg total) by mouth daily. 90 tablet 3   atorvastatin (LIPITOR) 80 MG tablet TAKE 1 TABLET BY MOUTH DAILY AT 6 PM. 90 tablet 0   carvedilol (COREG) 6.25 MG tablet TAKE 1 TABLET BY MOUTH TWICE A DAY 180 tablet 3   donepezil (ARICEPT) 10 MG tablet TAKE 1 TABLET BY MOUTH EVERYDAY AT BEDTIME 90 tablet 0   EPINEPHrine 0.3 mg/0.3 mL IJ SOAJ injection Inject 0.3 mLs (0.3 mg total) into the muscle as needed for anaphylaxis. 2 each 3   escitalopram (LEXAPRO) 10 MG tablet TAKE 1 TABLET BY MOUTH EVERY DAY 90 tablet 1   ferrous sulfate 324 MG TBEC Take 324 mg by mouth daily.     glucose blood (FREESTYLE LITE) test strip Check Blood Sugars 1-2 times per day. DX: E11.9. 100 each 5   Lancets (FREESTYLE) lancets Check Blood Sugars 1-2 times per day. DX: E11.9 100 each 5   levothyroxine (SYNTHROID) 75 MCG tablet TAKE 1 TABLET BY MOUTH EVERY DAY 90 tablet 1   losartan (COZAAR) 25 MG tablet Take 1 tablet (25 mg total) by mouth daily. 90 tablet 1  metFORMIN (GLUCOPHAGE) 1000 MG tablet Take 1 tablet (1,000 mg total) by mouth 2 (two) times daily. 180 tablet 1   Multiple Vitamin (MULTIVITAMIN) tablet Take 1 tablet by mouth daily. Centrium Silver once daily     vitamin B-12 (CYANOCOBALAMIN) 1000 MCG tablet Take 1,000 mcg by mouth daily.     vitamin C (ASCORBIC ACID) 500 MG tablet Take 500 mg by mouth every evening.      No facility-administered medications prior to visit.    PAST MEDICAL HISTORY: Past Medical History:  Diagnosis Date   Acute blood loss anemia    Arthritis    Brain aneurysm 2018   followed by Dr Roda Shutters   Cataract    Chronic kidney disease    kidney stone- 40 years ago   Coronary artery  disease    Cyst of pancreas 07/23/2019   Diabetes mellitus    Diverticulitis    Gait abnormality 01/11/2021   GERD (gastroesophageal reflux disease)    History of kidney stones    Hx of abdominal aortic aneurysm 1998   Hyperlipidemia    Hypertension    Lower GI bleed    Memory loss    Myocardial infarction    2016   Personal history of colonic polyps - adenomas 09/09/2013    PAST SURGICAL HISTORY: Past Surgical History:  Procedure Laterality Date   ABDOMINAL AORTIC ANEURYSM REPAIR  1998   CARDIAC CATHETERIZATION N/A 03/18/2015   Procedure: Left Heart Cath and Coronary Angiography;  Surgeon: Peter M Swaziland, MD;  Location: [facility] INVASIVE CV LAB;  Service: Cardiovascular;  Laterality: N/A;   CATARACT EXTRACTION Right    COLONOSCOPY     CORONARY ARTERY BYPASS GRAFT N/A 03/18/2015   Procedure: CORONARY ARTERY BYPASS GRAFTING (CABG) x  three, using left internal mammary artery and right leg greater saphenous vein harvested endoscopically;  Surgeon: Loreli Slot, MD;  Location: [facility] OR;  Service: Open Heart Surgery;  Laterality: N/A;   INGUINAL HERNIA REPAIR Bilateral    INTRAOPERATIVE TRANSESOPHAGEAL ECHOCARDIOGRAM N/A 03/18/2015   Procedure: INTRAOPERATIVE TRANSESOPHAGEAL ECHOCARDIOGRAM;  Surgeon: Loreli Slot, MD;  Location: [facility] OR;  Service: Open Heart Surgery;  Laterality: N/A;   REVERSE SHOULDER ARTHROPLASTY Left 09/21/2021   Procedure: REVERSE SHOULDER ARTHROPLASTY;  Surgeon: Francena Hanly, MD;  Location: [facility] ORS;  Service: Orthopedics;  Laterality: Left;    UMBILICAL HERNIA REPAIR  1965    FAMILY HISTORY: Family History  Problem Relation Age of Onset   Bone cancer Mother    Diabetes Paternal Aunt    Stroke Son    Down syndrome Son    Colon cancer Neg Hx    Esophageal cancer Neg Hx    Rectal cancer Neg Hx    Stomach cancer Neg Hx     SOCIAL HISTORY: Social History   Socioeconomic History   Marital status: Married    Spouse name: Alona Bene   Number of  children: 3   Years of education: 10   Highest education level: 10th grade  Occupational History   Not on file  Tobacco Use   Smoking status: Former    Packs/day: 1.50    Years: 20.00    Additional pack years: 0.00    Total pack years: 30.00    Types: Cigarettes    Quit date: 08/24/1986    Years since quitting: 35.9   Smokeless tobacco: Never  Vaping Use   Vaping Use: Never used  Substance and Sexual Activity   Alcohol use: Not Currently  Comment: occ   Drug use: No   Sexual activity: Not on file  Other Topics Concern   Not on file  Social History Narrative   07/17/21 Married and lives with wife in 4 story home.  Retired.      Adult son with Down syndrome died in 2018/08/18.  2 other children living.   Social Determinants of Health   Financial Resource Strain: Low Risk  (01/04/2022)   Overall Financial Resource Strain (CARDIA)    Difficulty of Paying Living Expenses: Not hard at all  Food Insecurity: No Food Insecurity (01/04/2022)   Hunger Vital Sign    Worried About Running Out of Food in the Last Year: Never true    Ran Out of Food in the Last Year: Never true  Transportation Needs: No Transportation Needs (01/04/2022)   PRAPARE - Administrator, Civil Service (Medical): No    Lack of Transportation (Non-Medical): No  Physical Activity: Inactive (01/04/2022)   Exercise Vital Sign    Days of Exercise per Week: 0 days    Minutes of Exercise per Session: 0 min  Stress: No Stress Concern Present (01/04/2022)   [employer] of Occupational Health - Occupational Stress Questionnaire    Feeling of Stress : Not at all  Social Connections: Socially Integrated (01/04/2022)   Social Connection and Isolation Panel [NHANES]    Frequency of Communication with Friends and Family: More than three times a week    Frequency of Social Gatherings with Friends and Family: More than three times a week    Attends Religious Services: More than 4 times per year    Active Member of  [employer] or Organizations: Yes    Attends Engineer, structural: More than 4 times per year    Marital Status: Married  Catering manager Violence: Not At Risk (01/04/2022)   Humiliation, Afraid, Rape, and Kick questionnaire    Fear of Current or Ex-Partner: No    Emotionally Abused: No    Physically Abused: No    Sexually Abused: No     PHYSICAL EXAM  GENERAL EXAM/CONSTITUTIONAL: Vitals:  Vitals:   07/17/22 1419  BP: 122/75  Pulse: 79  Weight: 145 lb 12.8 oz (66.1 kg)  Height:  (1.651 m)   Body mass index is 24.26 kg/m. Wt Readings from Last 3 Encounters:  07/17/22 145 lb 12.8 oz (66.1 kg)  04/11/22 145 lb 14.4 oz (66.2 kg)  01/04/22 153 lb 3.2 oz (69.5 kg)   Patient is in no distress; well developed, nourished and groomed; neck is supple  CARDIOVASCULAR: Examination of carotid arteries is normal; no carotid bruits Regular rate and rhythm, no murmurs Examination of peripheral vascular system by observation and palpation is normal  EYES: Ophthalmoscopic exam of optic discs and posterior segments is normal; no papilledema or hemorrhages No results found.  MUSCULOSKELETAL: Gait, strength, tone, movements noted in Neurologic exam below  NEUROLOGIC: MENTAL STATUS:     07/17/2022    2:24 PM 07/17/2021    2:12 PM 01/11/2021   11:25 AM  MMSE - Mini Mental State Exam  Orientation to time Orientation to Place Registration Attention/ Calculation Recall Language- name 2 objects Language- repeat 1 0 1  Language- follow 3 step command Language- read & follow direction Write a sentence 1 1 1  Copy design 1 1 0  Total score 22 21 22    awake, alert, oriented to person DECR MEMORY DECR attention and concentration language fluent, comprehension intact, naming intact fund of knowledge appropriate  CRANIAL NERVE:  2nd - no papilledema on fundoscopic exam 2nd, 3rd, 4th, 6th - pupils equal and reactive to  light, visual fields full to confrontation, extraocular muscles intact, no nystagmus 5th - facial sensation symmetric 7th - facial strength symmetric 8th - hearing intact 9th - palate elevates symmetrically, uvula midline 11th - shoulder shrug symmetric 12th - tongue protrusion midline  MOTOR:  normal bulk and tone, full strength in the BUE, BLE  SENSORY:  normal and symmetric to light touch; DECR IN FEET  COORDINATION:  finger-nose-finger, fine finger movements normal  REFLEXES:  deep tendon reflexes TRACE and symmetric  GAIT/STATION:  narrow based gait; STOOPED POSTURE; UNSTEADY     DIAGNOSTIC DATA (LABS, IMAGING, TESTING) - I reviewed patient records, labs, notes, testing and imaging myself where available.  Lab Results  Component Value Date   WBC 7.9 04/11/2022   HGB 14.0 04/11/2022   HCT 41.4 04/11/2022   MCV 96.6 04/11/2022   PLT 193.0 04/11/2022      Component Value Date/Time   NA 144 04/11/2022 1213   K 4.5 04/11/2022 1213   CL 102 04/11/2022 1213   CO2 32 04/11/2022 1213   GLUCOSE 101 (H) 04/11/2022 1213   BUN 20 04/11/2022 1213   CREATININE 1.00 04/11/2022 1213   CREATININE 1.16 12/16/2019 1400   CALCIUM 10.2 04/11/2022 1213   PROT 6.8 04/11/2022 1213   PROT 6.7 01/11/2021 1144   ALBUMIN 4.3 04/11/2022 1213   AST 19 04/11/2022 1213   ALT 19 04/11/2022 1213   ALKPHOS 84 04/11/2022 1213   BILITOT 0.8 04/11/2022 1213   GFRNONAA >60 09/12/2021 1053   GFRAA >60 06/26/2019 0606   Lab Results  Component Value Date   CHOL 123 04/11/2022   HDL 49.60 04/11/2022   LDLCALC 51 04/11/2022   LDLDIRECT 84.1 05/21/2013   TRIG 109.0 04/11/2022   CHOLHDL 2 04/11/2022   Lab Results  Component Value Date   HGBA1C 6.5 04/11/2022   Lab Results  Component Value Date   VITAMINB12 >2000 (H) 07/01/2020   Lab Results  Component Value Date   TSH 2.75 04/11/2022    07/05/20 MRI brain  - Abnormal MRI scan of the brain without contrast showing moderate changes  of generalized cerebral atrophy and small vessel disease.  No acute abnormalities noted.    ASSESSMENT AND PLAN  82 y.o. year old male here with:  Dx:  1. Memory loss   2. Peripheral polyneuropathy   3. Gait abnormality     PLAN:  MEMORY LOSS - likely mild cognitive impairment (vs mild dementia) - continue donepezil - safety / supervision issues reviewed - daily physical activity / exercise (at least 15-30 minutes) - eat more plants / vegetables - increase social activities, brain stimulation, games, puzzles, hobbies, crafts, arts, music - aim for at least 7-8 hours sleep per night (or more) - avoid smoking and alcohol - caregiver resources provided (challenging as patient's wife also having mild memory issues); has good support from adult children that live nearby - caution with medications, finances, driving  GAIT DIFF / IDIOPATHIC NEUROPATHY - supportive care; use cane / walker - fall precautions reviewed  Return for return to PCP, pending if symptoms worsen or fail to improve.    Suanne Marker, MD 07/17/2022, 3:17 PM  Certified in Neurology, Neurophysiology and [facility] [address], [place], [place] [address] [phone]

## 2022-08-02 DIAGNOSIS — M1612 Unilateral primary osteoarthritis, left hip: Secondary | ICD-10-CM | POA: Diagnosis not present

## 2022-08-06 DIAGNOSIS — Z9181 History of falling: Secondary | ICD-10-CM | POA: Diagnosis not present

## 2022-08-12 ENCOUNTER — Other Ambulatory Visit: Payer: Self-pay | Admitting: Family Medicine

## 2022-08-12 DIAGNOSIS — E785 Hyperlipidemia, unspecified: Secondary | ICD-10-CM

## 2022-08-23 DIAGNOSIS — R531 Weakness: Secondary | ICD-10-CM | POA: Diagnosis not present

## 2022-08-23 DIAGNOSIS — Z9181 History of falling: Secondary | ICD-10-CM | POA: Diagnosis not present

## 2022-08-30 DIAGNOSIS — Z9181 History of falling: Secondary | ICD-10-CM | POA: Diagnosis not present

## 2022-08-30 DIAGNOSIS — R531 Weakness: Secondary | ICD-10-CM | POA: Diagnosis not present

## 2022-09-03 DIAGNOSIS — R531 Weakness: Secondary | ICD-10-CM | POA: Diagnosis not present

## 2022-09-03 DIAGNOSIS — Z9181 History of falling: Secondary | ICD-10-CM | POA: Diagnosis not present

## 2022-09-05 DIAGNOSIS — Z9181 History of falling: Secondary | ICD-10-CM | POA: Diagnosis not present

## 2022-09-05 DIAGNOSIS — R531 Weakness: Secondary | ICD-10-CM | POA: Diagnosis not present

## 2022-09-10 DIAGNOSIS — Z9181 History of falling: Secondary | ICD-10-CM | POA: Diagnosis not present

## 2022-09-10 DIAGNOSIS — R531 Weakness: Secondary | ICD-10-CM | POA: Diagnosis not present

## 2022-09-12 DIAGNOSIS — R531 Weakness: Secondary | ICD-10-CM | POA: Diagnosis not present

## 2022-09-12 DIAGNOSIS — Z9181 History of falling: Secondary | ICD-10-CM | POA: Diagnosis not present

## 2022-09-14 ENCOUNTER — Other Ambulatory Visit: Payer: Self-pay | Admitting: Family Medicine

## 2022-09-16 ENCOUNTER — Other Ambulatory Visit: Payer: Self-pay | Admitting: Cardiology

## 2022-09-16 ENCOUNTER — Other Ambulatory Visit: Payer: Self-pay | Admitting: Family Medicine

## 2022-09-16 DIAGNOSIS — E1121 Type 2 diabetes mellitus with diabetic nephropathy: Secondary | ICD-10-CM

## 2022-09-17 DIAGNOSIS — Z9181 History of falling: Secondary | ICD-10-CM | POA: Diagnosis not present

## 2022-09-17 DIAGNOSIS — R531 Weakness: Secondary | ICD-10-CM | POA: Diagnosis not present

## 2022-09-19 DIAGNOSIS — M25562 Pain in left knee: Secondary | ICD-10-CM | POA: Diagnosis not present

## 2022-09-24 ENCOUNTER — Telehealth: Payer: Self-pay

## 2022-09-24 DIAGNOSIS — R531 Weakness: Secondary | ICD-10-CM | POA: Diagnosis not present

## 2022-09-24 DIAGNOSIS — Z9181 History of falling: Secondary | ICD-10-CM | POA: Diagnosis not present

## 2022-09-24 NOTE — Telephone Encounter (Signed)
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  Radiologist recommends looking again in 1 year - make a reminder or recall for that and add in there to message me about it then also as I will plan for office visit also (prior to ordering)   Please let the patient know that things are stable and we will review his situation in the office in 1 year  Sent 09/21/2021

## 2022-09-24 NOTE — Telephone Encounter (Signed)
Received Personal Reminder. Pt wife Alona Bene notified of reminder and  Dr. Leone Payor recommendations: Pt was scheduled for an office visit on 01/01/2023 at 8:50 AM. Alona Bene made aware. Alona Bene verbalized understanding with all questions answered.

## 2022-10-02 ENCOUNTER — Telehealth: Payer: Self-pay | Admitting: Family Medicine

## 2022-10-02 DIAGNOSIS — R42 Dizziness and giddiness: Secondary | ICD-10-CM | POA: Diagnosis not present

## 2022-10-02 DIAGNOSIS — R531 Weakness: Secondary | ICD-10-CM | POA: Diagnosis not present

## 2022-10-02 DIAGNOSIS — R9431 Abnormal electrocardiogram [ECG] [EKG]: Secondary | ICD-10-CM | POA: Diagnosis not present

## 2022-10-02 DIAGNOSIS — R011 Cardiac murmur, unspecified: Secondary | ICD-10-CM | POA: Diagnosis not present

## 2022-10-02 DIAGNOSIS — E119 Type 2 diabetes mellitus without complications: Secondary | ICD-10-CM | POA: Diagnosis not present

## 2022-10-02 DIAGNOSIS — Z9181 History of falling: Secondary | ICD-10-CM | POA: Diagnosis not present

## 2022-10-02 DIAGNOSIS — E860 Dehydration: Secondary | ICD-10-CM | POA: Diagnosis not present

## 2022-10-02 NOTE — Telephone Encounter (Signed)
I spoke with the patient's wife Alona Bene and Victorino Dike at [facility] and informed them of the message below and they verbalized understanding and would have patient proceed to ER.

## 2022-10-02 NOTE — Telephone Encounter (Signed)
Wants to discuss blood pressure and heart rate concerns

## 2022-10-02 NOTE — Telephone Encounter (Signed)
I spoke with Miguel Vazquez at [facility] and she reported the patient complains of Dizziness and headaches that started today. Miguel Vazquez recorded his Blood pressure when he arrived and stated his blood pressure was 117/70 initially with pulse of 40 and O2 sat of 78-79%. Miguel Vazquez reported that she retook his blood pressure moments later with readings of 117/67 sitting and 101/71 standing and after 3 minutes of standing his BP was 114/73 with O2 sats around 87-88%. Miguel Vazquez stated that his most recent O2 sat is at 95% but fluctuates. The patient reports Dizziness and lightheadedness with blurred vision, headaches and is feeling Jittery but has no chest pain. Please advise

## 2022-10-10 DIAGNOSIS — R531 Weakness: Secondary | ICD-10-CM | POA: Diagnosis not present

## 2022-10-10 DIAGNOSIS — Z9181 History of falling: Secondary | ICD-10-CM | POA: Diagnosis not present

## 2022-10-11 ENCOUNTER — Other Ambulatory Visit: Payer: Self-pay | Admitting: Cardiology

## 2022-10-12 DIAGNOSIS — Z9181 History of falling: Secondary | ICD-10-CM | POA: Diagnosis not present

## 2022-10-12 DIAGNOSIS — R531 Weakness: Secondary | ICD-10-CM | POA: Diagnosis not present

## 2022-10-15 ENCOUNTER — Other Ambulatory Visit: Payer: Self-pay | Admitting: Cardiology

## 2022-10-15 DIAGNOSIS — R531 Weakness: Secondary | ICD-10-CM | POA: Diagnosis not present

## 2022-10-15 DIAGNOSIS — Z9181 History of falling: Secondary | ICD-10-CM | POA: Diagnosis not present

## 2022-10-17 DIAGNOSIS — R531 Weakness: Secondary | ICD-10-CM | POA: Diagnosis not present

## 2022-10-17 DIAGNOSIS — Z9181 History of falling: Secondary | ICD-10-CM | POA: Diagnosis not present

## 2022-10-26 DIAGNOSIS — G3184 Mild cognitive impairment, so stated: Secondary | ICD-10-CM | POA: Diagnosis not present

## 2022-10-30 ENCOUNTER — Other Ambulatory Visit: Payer: Self-pay | Admitting: Cardiology

## 2022-10-30 DIAGNOSIS — Z9181 History of falling: Secondary | ICD-10-CM | POA: Diagnosis not present

## 2022-10-30 DIAGNOSIS — R531 Weakness: Secondary | ICD-10-CM | POA: Diagnosis not present

## 2022-11-01 DIAGNOSIS — R531 Weakness: Secondary | ICD-10-CM | POA: Diagnosis not present

## 2022-11-01 DIAGNOSIS — Z9181 History of falling: Secondary | ICD-10-CM | POA: Diagnosis not present

## 2022-11-05 DIAGNOSIS — R531 Weakness: Secondary | ICD-10-CM | POA: Diagnosis not present

## 2022-11-05 DIAGNOSIS — Z9181 History of falling: Secondary | ICD-10-CM | POA: Diagnosis not present

## 2022-11-07 ENCOUNTER — Telehealth: Payer: Self-pay | Admitting: Family Medicine

## 2022-11-07 DIAGNOSIS — R531 Weakness: Secondary | ICD-10-CM | POA: Diagnosis not present

## 2022-11-07 DIAGNOSIS — Z9181 History of falling: Secondary | ICD-10-CM | POA: Diagnosis not present

## 2022-11-07 NOTE — Telephone Encounter (Signed)
Spoke with Beth at the PT office and she stated Miguel Vazquez is out of the office for today.  I left a message at the patient's cell number to return my call.

## 2022-11-07 NOTE — Telephone Encounter (Addendum)
Ronne Binning at [facility]  [phone]  FYI: Pt has been complaining of shakiness and lightheadedness on & off for quite a while  Today's BP Readings Sitting BP:  100/70, then 105/61 HR 82 Initial standing BP:  94/57 HR: 91 After 3 minutes  BP: 104/53 HR: 86 Oxygen 98  On 11/01/22 Really dizzy after a floor exercise BP: 202/173 HR: 55 Oxygen 97 After several minutes BP: 137/82 HR: 73  *Please call Victorino Dike, if you have any further questions/concerns

## 2022-11-07 NOTE — Telephone Encounter (Signed)
Mrs Beesley' wife called back and was informed of the message below per PCP.  Per the spouses preference, the patient was scheduled for an appt with Dr Swaziland on 8/9.

## 2022-11-07 NOTE — Progress Notes (Deleted)
ACUTE VISIT No chief complaint on file.  HPI: [name] is a 82 y.o. male, who is here today complaining of *** HPI  Review of Systems See other pertinent positives and negatives in HPI.  Current Outpatient Medications on File Prior to Visit  Medication Sig Dispense Refill  . aspirin EC 81 MG tablet Take 1 tablet (81 mg total) by mouth daily. 90 tablet 3  . atorvastatin (LIPITOR) 80 MG tablet TAKE 1 TABLET BY MOUTH DAILY AT 6 PM. 90 tablet 0  . carvedilol (COREG) 6.25 MG tablet TAKE 1 TABLET BY MOUTH TWICE A DAY 30 tablet 0  . donepezil (ARICEPT) 10 MG tablet TAKE 1 TABLET BY MOUTH EVERYDAY AT BEDTIME 90 tablet 0  . EPINEPHrine 0.3 mg/0.3 mL IJ SOAJ injection Inject 0.3 mLs (0.3 mg total) into the muscle as needed for anaphylaxis. 2 each 3  . escitalopram (LEXAPRO) 10 MG tablet TAKE 1 TABLET BY MOUTH EVERY DAY 90 tablet 0  . ferrous sulfate 324 MG TBEC Take 324 mg by mouth daily.    Marland Kitchen glucose blood (FREESTYLE LITE) test strip Check Blood Sugars 1-2 times per day. DX: E11.9. 100 each 5  . Lancets (FREESTYLE) lancets Check Blood Sugars 1-2 times per day. DX: E11.9 100 each 5  . levothyroxine (SYNTHROID) 75 MCG tablet TAKE 1 TABLET BY MOUTH EVERY DAY 90 tablet 0  . losartan (COZAAR) 25 MG tablet TAKE 1 TABLET (25 MG TOTAL) BY MOUTH DAILY. 90 tablet 1  . metFORMIN (GLUCOPHAGE) 1000 MG tablet TAKE 1 TABLET BY MOUTH TWICE A DAY 180 tablet 0  . Multiple Vitamin (MULTIVITAMIN) tablet Take 1 tablet by mouth daily. Centrium Silver once daily    . vitamin B-12 (CYANOCOBALAMIN) 1000 MCG tablet Take 1,000 mcg by mouth daily.    . vitamin C (ASCORBIC ACID) 500 MG tablet Take 500 mg by mouth every evening.      No current facility-administered medications on file prior to visit.    Past Medical History:  Diagnosis Date  . Acute blood loss anemia   . Arthritis   . Brain aneurysm 2018   followed by Dr Roda Shutters  . Cataract   . Chronic kidney disease    kidney stone- 40 years ago  .  Coronary artery disease   . 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 Diabetes mellitus   . Diverticulitis   . Gait abnormality 01/11/2021  . GERD (gastroesophageal reflux disease)   . History of kidney stones   . Hx of abdominal aortic aneurysm 1998  . Hyperlipidemia   . Hypertension   . Lower GI bleed   . Memory loss   . Myocardial infarction (HCC)    2016  . Personal history of colonic polyps - adenomas 09/09/2013   Allergies  Allergen Reactions  . Food Anaphylaxis    TREE NUTS  . Shellfish Allergy Anaphylaxis  . Prednisone     "psychosis"  . Tramadol Other (See Comments)    hallucinations  . Venomil Honey Bee Venom [Honey Bee Venom] Swelling    Localized swelling    Social History   Socioeconomic History  . Marital status: Married    Spouse name: Alona Bene  . Number of children: 3  . Years of education: 10  . Highest education level: 10th grade  Occupational History  . Not on file  Tobacco Use  . Smoking status: Former    Current packs/day: 0.00    Average packs/day: 1.5 packs/day for 20.0 years (30.0 ttl pk-yrs)  Types: Cigarettes    Start date: 08/24/1966    Quit date: 08/24/1986    Years since quitting: 36.2  . Smokeless tobacco: Never  Vaping Use  . Vaping status: Never Used  Substance and Sexual Activity  . Alcohol use: Not Currently    Comment: occ  . Drug use: No  . Sexual activity: Not on file  Other Topics Concern  . Not on file  Social History Narrative   07/17/21 Married and lives with wife in 4 story home.  Retired.      Adult son with Down syndrome died in 11/16/18.  2 other children living.   Social Determinants of Health   Financial Resource Strain: Low Risk  (01/04/2022)   Overall Financial Resource Strain (CARDIA)   . Difficulty of Paying Living Expenses: Not hard at all  Food Insecurity: No Food Insecurity (01/04/2022)   Hunger Vital Sign   . Worried About Programme researcher, broadcasting/film/video in the Last Year: Never true   . Ran Out of Food in the Last Year: Never  true  Transportation Needs: No Transportation Needs (01/04/2022)   PRAPARE - Transportation   . Lack of Transportation (Medical): No   . Lack of Transportation (Non-Medical): No  Physical Activity: Inactive (01/04/2022)   Exercise Vital Sign   . Days of Exercise per Week: 0 days   . Minutes of Exercise per Session: 0 min  Stress: No Stress Concern Present (01/04/2022)   [employer] of Occupational Health - Occupational Stress Questionnaire   . Feeling of Stress : Not at all  Social Connections: Unknown (10/02/2022)   Received from [facility]   Social Network   . Social Network: Not on file    There were no vitals filed for this visit. There is no height or weight on file to calculate BMI.  Physical Exam  ASSESSMENT AND PLAN: There are no diagnoses linked to this encounter.  No follow-ups on file.  Betty G. Swaziland, MD  [facility]. [place] office.  Discharge Instructions   None

## 2022-11-09 ENCOUNTER — Ambulatory Visit: Payer: PPO | Admitting: Family Medicine

## 2022-11-11 ENCOUNTER — Other Ambulatory Visit: Payer: Self-pay | Admitting: Family Medicine

## 2022-11-11 DIAGNOSIS — E785 Hyperlipidemia, unspecified: Secondary | ICD-10-CM

## 2022-11-14 DIAGNOSIS — Z9181 History of falling: Secondary | ICD-10-CM | POA: Diagnosis not present

## 2022-11-14 DIAGNOSIS — R531 Weakness: Secondary | ICD-10-CM | POA: Diagnosis not present

## 2022-11-15 ENCOUNTER — Ambulatory Visit (INDEPENDENT_AMBULATORY_CARE_PROVIDER_SITE_OTHER): Payer: PPO | Admitting: Family Medicine

## 2022-11-15 ENCOUNTER — Encounter: Payer: Self-pay | Admitting: Family Medicine

## 2022-11-15 VITALS — BP 118/74 | HR 78 | Temp 97.9°F | Wt 150.0 lb

## 2022-11-15 DIAGNOSIS — I10 Essential (primary) hypertension: Secondary | ICD-10-CM | POA: Diagnosis not present

## 2022-11-15 DIAGNOSIS — I25810 Atherosclerosis of coronary artery bypass graft(s) without angina pectoris: Secondary | ICD-10-CM

## 2022-11-15 DIAGNOSIS — I255 Ischemic cardiomyopathy: Secondary | ICD-10-CM | POA: Diagnosis not present

## 2022-11-15 NOTE — Progress Notes (Signed)
   Subjective:    Patient ID: Miguel Vazquez, male    DOB: September 09, 1940, 82 y.o.   MRN: [id number]  HPI Here with his wife to discuss his BP. When he is resting, his BP normally is quite stable. However he has been going to PT the past few weeks, and when they check his BP it jumps up as high as 200 systolic. Then it comes back down fairly quickly when he rests. When it gets high like that he feels dizzy, but he denies any headache or chest pain or SOB. His ankles do not swell. He has ischemic cardiomyopathy,  and an [facility] last year showed his EF to be 45-50%. He sees Dr. Swaziland for cardiology care. He currently takes Losartan 25 mg daily and Carvedilol 6.25 mg BID.    Review of Systems  Constitutional: Negative.   Respiratory: Negative.    Cardiovascular: Negative.   Neurological:  Positive for dizziness. Negative for headaches.       Objective:   Physical Exam Constitutional:      Appearance: Normal appearance.     Comments: Walks with a cane   Cardiovascular:     Rate and Rhythm: Normal rate and regular rhythm.     Pulses: Normal pulses.     Comments: Has a 2/6 SM (consistent with AS)  Pulmonary:     Effort: Pulmonary effort is normal.     Breath sounds: Normal breath sounds.  Neurological:     Mental Status: He is alert.           Assessment & Plan:  He has sharp increases in his BP during exercise, but it is normal at rest. We will increase the Losartan to 2 pills a day (50 mg) for 2 weeks, then he will follow up with Dr. Caryl Never.  Gershon Crane, MD

## 2022-11-16 ENCOUNTER — Ambulatory Visit: Payer: PPO | Admitting: Family Medicine

## 2022-11-20 ENCOUNTER — Telehealth: Payer: Self-pay | Admitting: Family Medicine

## 2022-11-20 DIAGNOSIS — R531 Weakness: Secondary | ICD-10-CM | POA: Diagnosis not present

## 2022-11-20 DIAGNOSIS — Z9181 History of falling: Secondary | ICD-10-CM | POA: Diagnosis not present

## 2022-11-20 NOTE — Telephone Encounter (Signed)
Patient's daughter informed of the message below and voiced understanding. F/U scheduled

## 2022-11-20 NOTE — Telephone Encounter (Signed)
Says patient's meds were increased but they don't remember which one. Requesting a call to clarify.

## 2022-11-27 DIAGNOSIS — Z9181 History of falling: Secondary | ICD-10-CM | POA: Diagnosis not present

## 2022-11-27 DIAGNOSIS — R531 Weakness: Secondary | ICD-10-CM | POA: Diagnosis not present

## 2022-11-29 DIAGNOSIS — Z9181 History of falling: Secondary | ICD-10-CM | POA: Diagnosis not present

## 2022-11-29 DIAGNOSIS — R531 Weakness: Secondary | ICD-10-CM | POA: Diagnosis not present

## 2022-12-04 ENCOUNTER — Ambulatory Visit (INDEPENDENT_AMBULATORY_CARE_PROVIDER_SITE_OTHER): Payer: PPO | Admitting: Family Medicine

## 2022-12-04 ENCOUNTER — Telehealth: Payer: Self-pay | Admitting: Family Medicine

## 2022-12-04 VITALS — BP 114/70 | HR 67 | Ht 65.0 in | Wt 147.7 lb

## 2022-12-04 DIAGNOSIS — Z7984 Long term (current) use of oral hypoglycemic drugs: Secondary | ICD-10-CM

## 2022-12-04 DIAGNOSIS — I10 Essential (primary) hypertension: Secondary | ICD-10-CM

## 2022-12-04 DIAGNOSIS — Z9181 History of falling: Secondary | ICD-10-CM | POA: Diagnosis not present

## 2022-12-04 DIAGNOSIS — E1121 Type 2 diabetes mellitus with diabetic nephropathy: Secondary | ICD-10-CM

## 2022-12-04 LAB — POCT GLYCOSYLATED HEMOGLOBIN (HGB A1C): Hemoglobin A1C: 6 % — AB (ref 4.0–5.6)

## 2022-12-04 MED ORDER — LOSARTAN POTASSIUM 25 MG PO TABS: 25.0000 mg | ORAL_TABLET | Freq: Every day | ORAL | 3 refills | Status: DC

## 2022-12-04 NOTE — Progress Notes (Signed)
Established Patient Office Visit  Subjective   Patient ID: Miguel Vazquez, male    DOB: 06/18/1940  Age: 82 y.o. MRN: [id number]  Chief Complaint  Patient presents with   Medical Management of Chronic Issues    HPI   Miguel Vazquez is seen for medical follow-up.  He has history of CAD, hypertension, hypothyroidism, type 2 diabetes, chronic balance issues with very high risk for falls, hyperlipidemia,.  History of CABG 2016.  Has been getting some physical therapy in [facility] which they feel is beneficial.  He apparently had some blood pressure instability recently.  Physical therapist had called in a blood pressure reading of 202/173 which we felt had to be erroneous.  Most of his readings have been fairly well-controlled.  He was seen by another provider here and they bumped his losartan 25 mg up to 2 daily in addition to his usual carvedilol dosage 6.25 mg twice daily.  Denies any dizziness.  No headaches.  No recent chest pains.  He has type 2 diabetes.  A1c has been well-controlled.  Does not monitor blood sugars regularly.  He has hypothyroidism treated with levothyroxine 75 mcg daily.  TSH was checked in January and stable.  He had lipids at that time as well.  They would like to consider prescription for rollator walker with seat.  Because of his balance issues and lack of stamina they are limited in how much walking they can do outside the home.  Past Medical History:  Diagnosis Date   Acute blood loss anemia    Arthritis    Brain aneurysm 2018   followed by Dr Roda Shutters   Cataract    Chronic kidney disease    kidney stone- 40 years ago   Coronary artery disease    Cyst of pancreas 07/23/2019   Diabetes mellitus    Diverticulitis    Gait abnormality 01/11/2021   GERD (gastroesophageal reflux disease)    History of kidney stones    Hx of abdominal aortic aneurysm 1998   Hyperlipidemia    Hypertension    Lower GI bleed    Memory loss    Myocardial infarction (HCC)    2016    Personal history of colonic polyps - adenomas 09/09/2013   Past Surgical History:  Procedure Laterality Date   ABDOMINAL AORTIC ANEURYSM REPAIR  1998   CARDIAC CATHETERIZATION N/A 03/18/2015   Procedure: Left Heart Cath and Coronary Angiography;  Surgeon: Peter M Swaziland, MD;  Location: [facility] INVASIVE CV LAB;  Service: Cardiovascular;  Laterality: N/A;   CATARACT EXTRACTION Right    COLONOSCOPY     CORONARY ARTERY BYPASS GRAFT N/A 03/18/2015   Procedure: CORONARY ARTERY BYPASS GRAFTING (CABG) x  three, using left internal mammary artery and right leg greater saphenous vein harvested endoscopically;  Surgeon: Loreli Slot, MD;  Location: [facility] OR;  Service: Open Heart Surgery;  Laterality: N/A;   INGUINAL HERNIA REPAIR Bilateral    INTRAOPERATIVE TRANSESOPHAGEAL ECHOCARDIOGRAM N/A 03/18/2015   Procedure: INTRAOPERATIVE TRANSESOPHAGEAL ECHOCARDIOGRAM;  Surgeon: Loreli Slot, MD;  Location: [facility] OR;  Service: Open Heart Surgery;  Laterality: N/A;   REVERSE SHOULDER ARTHROPLASTY Left 09/21/2021   Procedure: REVERSE SHOULDER ARTHROPLASTY;  Surgeon: Francena Hanly, MD;  Location: [facility] ORS;  Service: Orthopedics;  Laterality: Left;    UMBILICAL HERNIA REPAIR  1965    reports that he quit smoking about 36 years ago. His smoking use included cigarettes. He started smoking about 56 years ago. He has a 30  pack-year smoking history. He has never used smokeless tobacco. He reports that he does not currently use alcohol. He reports that he does not use drugs. family history includes Bone cancer in his mother; Diabetes in his paternal aunt; Down syndrome in his son; Stroke in his son. Allergies  Allergen Reactions   Food Anaphylaxis    TREE NUTS   Shellfish Allergy Anaphylaxis   Prednisone     "psychosis"   Tramadol Other (See Comments)    hallucinations   Venomil Honey Bee Venom [Honey Bee Venom] Swelling    Localized swelling    Review of Systems  Constitutional:  Negative for chills,  fever and malaise/fatigue.  Eyes:  Negative for blurred vision.  Respiratory:  Negative for shortness of breath.   Cardiovascular:  Negative for chest pain.  Genitourinary:  Negative for dysuria.  Neurological:  Negative for dizziness, weakness and headaches.      Objective:     BP 114/70 (BP Location: Left Arm, Patient Position: Sitting, Cuff Size: Normal)   Pulse 67   Ht 5\' 5"  (1.651 m)   Wt 147 lb 11.2 oz (67 kg)   SpO2 99%   BMI 24.58 kg/m  BP Readings from Last 3 Encounters:  12/04/22 114/70  11/15/22 118/74  07/17/22 122/75   Wt Readings from Last 3 Encounters:  12/04/22 147 lb 11.2 oz (67 kg)  11/15/22 150 lb (68 kg)  07/17/22 145 lb 12.8 oz (66.1 kg)      Physical Exam Vitals reviewed.  Constitutional:      Appearance: Normal appearance.  Cardiovascular:     Rate and Rhythm: Normal rate and regular rhythm.     Heart sounds: Murmur heard.  Pulmonary:     Effort: Pulmonary effort is normal.     Breath sounds: Normal breath sounds.  Musculoskeletal:     Right lower leg: No edema.     Left lower leg: No edema.  Neurological:     Mental Status: He is alert.      Results for orders placed or performed in visit on 12/04/22  POC HgB A1c  Result Value Ref Range   Hemoglobin A1C 6.0 (A) 4.0 - 5.6 %   HbA1c POC (<> result, manual entry)     HbA1c, POC (prediabetic range)     HbA1c, POC (controlled diabetic range)      Last CBC Lab Results  Component Value Date   WBC 7.9 04/11/2022   HGB 14.0 04/11/2022   HCT 41.4 04/11/2022   MCV 96.6 04/11/2022   MCH 33.7 09/12/2021   RDW 13.8 04/11/2022   PLT 193.0 04/11/2022   Last metabolic panel Lab Results  Component Value Date   GLUCOSE 101 (H) 04/11/2022   NA 144 04/11/2022   K 4.5 04/11/2022   CL 102 04/11/2022   CO2 32 04/11/2022   BUN 20 04/11/2022   CREATININE 1.00 04/11/2022   GFR 71.23 04/11/2022   CALCIUM 10.2 04/11/2022   PHOS 3.1 09/03/2016   PROT 6.8 04/11/2022   ALBUMIN 4.3 04/11/2022    LABGLOB 2.8 01/11/2021   BILITOT 0.8 04/11/2022   ALKPHOS 84 04/11/2022   AST 19 04/11/2022   ALT 19 04/11/2022   ANIONGAP 9 09/12/2021   Last lipids Lab Results  Component Value Date   CHOL 123 04/11/2022   HDL 49.60 04/11/2022   LDLCALC 51 04/11/2022   LDLDIRECT 84.1 05/21/2013   TRIG 109.0 04/11/2022   CHOLHDL 2 04/11/2022   Last hemoglobin A1c Lab Results  Component Value Date   HGBA1C 6.0 (A) 12/04/2022   Last thyroid functions Lab Results  Component Value Date   TSH 2.75 04/11/2022      The ASCVD Risk score (Arnett DK, et al., 2019) failed to calculate for the following reasons:   The 2019 ASCVD risk score is only valid for ages 66 to 54   The patient has a prior MI or stroke diagnosis    Assessment & Plan:   #1 type 2 diabetes.  Well-controlled with A1c today 6.0%.  Continue metformin.  Continue to monitor periodically.  Continue low glycemic diet.  #2 hypertension.  He had a few sporadic readings elevated at physical therapist.  Readings have been very stable since then.  Refill losartan 25 mg daily and continue carvedilol 6.25 mg twice daily.  Continue to monitor periodically at home.  #3 high risk for falls.  Patient currently getting physical therapy.  We wrote prescription for rollator walker with seat for as needed use.  He is encouraged to use his cane consistently when not using the walker.  Set up follow-up in January to reassess labs including TSH, lipids, comprehensive metabolic panel   Return in about 4 months (around 04/05/2023).    Evelena Peat, MD

## 2022-12-04 NOTE — Telephone Encounter (Signed)
Medication was discussed during visit today with PCP

## 2022-12-04 NOTE — Telephone Encounter (Signed)
Pt daughter called, pt medication was doubled to 50mg , pt needs a new prescription for the correct dosage.  losartan (COZAAR) 25 MG tablet   [id number] - [place], [place] - [address] Phone: [phone]  Fax: [phone]

## 2022-12-05 DIAGNOSIS — Z9181 History of falling: Secondary | ICD-10-CM | POA: Diagnosis not present

## 2022-12-05 DIAGNOSIS — R531 Weakness: Secondary | ICD-10-CM | POA: Diagnosis not present

## 2022-12-10 ENCOUNTER — Ambulatory Visit: Payer: PPO | Admitting: Diagnostic Neuroimaging

## 2022-12-10 ENCOUNTER — Encounter: Payer: Self-pay | Admitting: Diagnostic Neuroimaging

## 2022-12-10 VITALS — Ht 67.0 in | Wt 149.0 lb

## 2022-12-10 DIAGNOSIS — R531 Weakness: Secondary | ICD-10-CM | POA: Diagnosis not present

## 2022-12-10 DIAGNOSIS — R269 Unspecified abnormalities of gait and mobility: Secondary | ICD-10-CM | POA: Diagnosis not present

## 2022-12-10 DIAGNOSIS — R413 Other amnesia: Secondary | ICD-10-CM | POA: Diagnosis not present

## 2022-12-10 DIAGNOSIS — G629 Polyneuropathy, unspecified: Secondary | ICD-10-CM | POA: Diagnosis not present

## 2022-12-10 DIAGNOSIS — Z9181 History of falling: Secondary | ICD-10-CM | POA: Diagnosis not present

## 2022-12-10 NOTE — Patient Instructions (Signed)
MEMORY LOSS - likely mild cognitive impairment (vs mild dementia) - continue donepezil - safety / supervision issues reviewed - daily physical activity / exercise (at least 15-30 minutes) - eat more plants / vegetables - increase social activities, brain stimulation, games, puzzles, hobbies, crafts, arts, music - aim for at least 7-8 hours sleep per night (or more) - avoid smoking and alcohol - caregiver resources provided (challenging as patient's wife also having mild memory issues); has good support from adult children that live nearby - caution with medications, finances, driving  GAIT DIFF / IDIOPATHIC NEUROPATHY - supportive care; use cane / walker - fall precautions reviewed

## 2022-12-10 NOTE — Progress Notes (Signed)
[facility]  PATIENT: Miguel Vazquez DOB: [birth year]  REFERRING CLINICIAN: Kristian Covey, MD HISTORY FROM: patient and wife REASON FOR VISIT: follow up   HISTORICAL  CHIEF COMPLAINT:  Chief Complaint  Patient presents with   Follow-up    Rm 7, here with wife  Miguel Vazquez  Pt is following up on memory loss. Pt states he is doing well. Pt's wife states she has not notices a decline on his memory, its stable.     HISTORY OF PRESENT ILLNESS:   UPDATE (12/10/22, [facility]): Since last visit, doing well. Symptoms are stable. Severity is mild. No alleviating or aggravating factors. Tolerating meds.    UPDATE (07/17/22, [facility]): Since last visit, more gait and balance issues. Walking more slowly. More falls. Symptoms are progressive. No alleviating or aggravating factors.   UPDATE (07/17/21, [facility]): Since last visit, doing about the same. Mild memory issues since past 1-2 years. Some changes in ADLs (occ gets lost driving, but usually with wife to help). 10th grade education. Also with some neuropathy (unexplained). Some balance issues. Wife also having some memory loss issues.  PRIOR HPI (01/11/21, Dr. Anne Vazquez): Miguel Vazquez is a 82 year old right-handed white male with a history of a memory disorder, he is currently on Aricept and tolerates this well.  He has had some slowness of gait that has developed over the last year or so.  The patient does have a history of diabetes.  On the prior examination, he was noted to have sensory alteration in the feet consistent with a peripheral neuropathy.  The patient reports no burning or stinging in the feet.  He did fall recently, he landed on his buttocks, he did have an x-ray that did not show evidence of a fracture.  He has not had any other falls.  He denies issues controlling the bowels or the bladder.  He has not had any further episodes of confusion.  He returns to the office today for an evaluation.  He claims that he sleeps fairly well at  night.    REVIEW OF SYSTEMS: Full 14 system review of systems performed and negative with exception of: as per HPI.  ALLERGIES: Allergies  Allergen Reactions   Food Anaphylaxis    TREE NUTS   Shellfish Allergy Anaphylaxis   Prednisone     "psychosis"   Tramadol Other (See Comments)    hallucinations   Venomil Honey Bee Venom [Honey Bee Venom] Swelling    Localized swelling    HOME MEDICATIONS: Outpatient Medications Prior to Visit  Medication Sig Dispense Refill   aspirin EC 81 MG tablet Take 1 tablet (81 mg total) by mouth daily. 90 tablet 3   atorvastatin (LIPITOR) 80 MG tablet TAKE 1 TABLET BY MOUTH DAILY AT 6 PM. 90 tablet 0   carvedilol (COREG) 6.25 MG tablet TAKE 1 TABLET BY MOUTH TWICE A DAY 30 tablet 0   donepezil (ARICEPT) 10 MG tablet TAKE 1 TABLET BY MOUTH EVERYDAY AT BEDTIME 90 tablet 0   EPINEPHrine 0.3 mg/0.3 mL IJ SOAJ injection Inject 0.3 mLs (0.3 mg total) into the muscle as needed for anaphylaxis. 2 each 3   escitalopram (LEXAPRO) 10 MG tablet TAKE 1 TABLET BY MOUTH EVERY DAY 90 tablet 0   ferrous sulfate 324 MG TBEC Take 324 mg by mouth daily.     glucose blood (FREESTYLE LITE) test strip Check Blood Sugars 1-2 times per day. DX: E11.9. 100 each 5   Lancets (FREESTYLE) lancets Check Blood Sugars 1-2 times  per day. DX: E11.9 100 each 5   levothyroxine (SYNTHROID) 75 MCG tablet TAKE 1 TABLET BY MOUTH EVERY DAY 90 tablet 0   losartan (COZAAR) 25 MG tablet Take 1 tablet (25 mg total) by mouth daily. 90 tablet 3   metFORMIN (GLUCOPHAGE) 1000 MG tablet TAKE 1 TABLET BY MOUTH TWICE A DAY 180 tablet 0   Multiple Vitamin (MULTIVITAMIN) tablet Take 1 tablet by mouth daily. Centrium Silver once daily     vitamin B-12 (CYANOCOBALAMIN) 1000 MCG tablet Take 1,000 mcg by mouth daily.     vitamin C (ASCORBIC ACID) 500 MG tablet Take 500 mg by mouth every evening.      No facility-administered medications prior to visit.    PAST MEDICAL HISTORY: Past Medical History:   Diagnosis Date   Acute blood loss anemia    Arthritis    Brain aneurysm 2018   followed by Dr Roda Shutters   Cataract    Chronic kidney disease    kidney stone- 40 years ago   Coronary artery disease    Cyst of pancreas 07/23/2019   Diabetes mellitus    Diverticulitis    Gait abnormality 01/11/2021   GERD (gastroesophageal reflux disease)    History of kidney stones    Hx of abdominal aortic aneurysm 1998   Hyperlipidemia    Hypertension    Lower GI bleed    Memory loss    Myocardial infarction (HCC)    2016   Personal history of colonic polyps - adenomas 09/09/2013    PAST SURGICAL HISTORY: Past Surgical History:  Procedure Laterality Date   ABDOMINAL AORTIC ANEURYSM REPAIR  1998   CARDIAC CATHETERIZATION N/A 03/18/2015   Procedure: Left Heart Cath and Coronary Angiography;  Surgeon: Peter M Swaziland, MD;  Location: [facility] INVASIVE CV LAB;  Service: Cardiovascular;  Laterality: N/A;   CATARACT EXTRACTION Right    COLONOSCOPY     CORONARY ARTERY BYPASS GRAFT N/A 03/18/2015   Procedure: CORONARY ARTERY BYPASS GRAFTING (CABG) x  three, using left internal mammary artery and right leg greater saphenous vein harvested endoscopically;  Surgeon: Loreli Slot, MD;  Location: [facility] OR;  Service: Open Heart Surgery;  Laterality: N/A;   INGUINAL HERNIA REPAIR Bilateral    INTRAOPERATIVE TRANSESOPHAGEAL ECHOCARDIOGRAM N/A 03/18/2015   Procedure: INTRAOPERATIVE TRANSESOPHAGEAL ECHOCARDIOGRAM;  Surgeon: Loreli Slot, MD;  Location: [facility] OR;  Service: Open Heart Surgery;  Laterality: N/A;   REVERSE SHOULDER ARTHROPLASTY Left 09/21/2021   Procedure: REVERSE SHOULDER ARTHROPLASTY;  Surgeon: Francena Hanly, MD;  Location: [facility] ORS;  Service: Orthopedics;  Laterality: Left;    UMBILICAL HERNIA REPAIR  1965    FAMILY HISTORY: Family History  Problem Relation Age of Onset   Bone cancer Mother    Diabetes Paternal Aunt    Stroke Son    Down syndrome Son    Colon cancer Neg Hx     Esophageal cancer Neg Hx    Rectal cancer Neg Hx    Stomach cancer Neg Hx     SOCIAL HISTORY: Social History   Socioeconomic History   Marital status: Married    Spouse name: Miguel Vazquez   Number of children: 3   Years of education: 10   Highest education level: 10th grade  Occupational History   Not on file  Tobacco Use   Smoking status: Former    Current packs/day: 0.00    Average packs/day: 1.5 packs/day for 20.0 years (30.0 ttl pk-yrs)    Types: Cigarettes    Start  date: 08/24/1966    Quit date: 08/24/1986    Years since quitting: 36.3   Smokeless tobacco: Never  Vaping Use   Vaping status: Never Used  Substance and Sexual Activity   Alcohol use: Not Currently    Comment: occ   Drug use: No   Sexual activity: Not on file  Other Topics Concern   Not on file  Social History Narrative   07/17/21 Married and lives with wife in 4 story home.  Retired.      Adult son with Down syndrome died in Jan 13, 2019.  2 other children living.   Social Determinants of Health   Financial Resource Strain: Low Risk  (01/04/2022)   Overall Financial Resource Strain (CARDIA)    Difficulty of Paying Living Expenses: Not hard at all  Food Insecurity: No Food Insecurity (01/04/2022)   Hunger Vital Sign    Worried About Running Out of Food in the Last Year: Never true    Ran Out of Food in the Last Year: Never true  Transportation Needs: No Transportation Needs (01/04/2022)   PRAPARE - Administrator, Civil Service (Medical): No    Lack of Transportation (Non-Medical): No  Physical Activity: Inactive (01/04/2022)   Exercise Vital Sign    Days of Exercise per Week: 0 days    Minutes of Exercise per Session: 0 min  Stress: No Stress Concern Present (01/04/2022)   [employer] of Occupational Health - Occupational Stress Questionnaire    Feeling of Stress : Not at all  Social Connections: Unknown (10/02/2022)   Received from [facility]   Social Network    Social Network: Not on file   Intimate Partner Violence: Not At Risk (10/02/2022)   Received from [facility]   HITS    Over the last 12 months how often did your partner physically hurt you?: 1    Over the last 12 months how often did your partner insult you or talk down to you?: 1    Over the last 12 months how often did your partner threaten you with physical harm?: 1    Over the last 12 months how often did your partner scream or curse at you?: 1     PHYSICAL EXAM  GENERAL EXAM/CONSTITUTIONAL: Vitals:  Vitals:   12/10/22 1613  Weight: 149 lb (67.6 kg)  Height: 5\' 7"  (1.702 m)   Body mass index is 23.34 kg/m. Wt Readings from Last 3 Encounters:  12/10/22 149 lb (67.6 kg)  12/04/22 147 lb 11.2 oz (67 kg)  11/15/22 150 lb (68 kg)   Patient is in no distress; well developed, nourished and groomed; neck is supple  CARDIOVASCULAR: Examination of carotid arteries is normal; no carotid bruits Regular rate and rhythm, no murmurs Examination of peripheral vascular system by observation and palpation is normal  EYES: Ophthalmoscopic exam of optic discs and posterior segments is normal; no papilledema or hemorrhages No results found.  MUSCULOSKELETAL: Gait, strength, tone, movements noted in Neurologic exam below  NEUROLOGIC: MENTAL STATUS:     07/17/2022    2:24 PM 07/17/2021    2:12 PM 01/11/2021   11:25 AM  MMSE - Mini Mental State Exam  Orientation to time 1 2 3   Orientation to Place 5 4 4   Registration 3 3 3   Attention/ Calculation 1 1 2   Recall 3 3 2   Language- name 2 objects 2 2 2   Language- repeat 1 0 1  Language- follow 3 step command 3 3  3  Language- read & follow direction 1 1 1   Write a sentence 1 1 1   Copy design 1 1 0  Total score 22 21 22    awake, alert, oriented to person DECR MEMORY DECR attention and concentration language fluent, comprehension intact, naming intact fund of knowledge appropriate  CRANIAL NERVE:  2nd - no papilledema on fundoscopic  exam 2nd, 3rd, 4th, 6th - pupils equal and reactive to light, visual fields full to confrontation, extraocular muscles intact, no nystagmus 5th - facial sensation symmetric 7th - facial strength symmetric 8th - hearing intact 9th - palate elevates symmetrically, uvula midline 11th - shoulder shrug symmetric 12th - tongue protrusion midline  MOTOR:  normal bulk and tone, full strength in the BUE, BLE  SENSORY:  normal and symmetric to light touch; DECR IN FEET  COORDINATION:  finger-nose-finger, fine finger movements normal  REFLEXES:  deep tendon reflexes TRACE and symmetric  GAIT/STATION:  narrow based gait; STOOPED POSTURE; UNSTEADY     DIAGNOSTIC DATA (LABS, IMAGING, TESTING) - I reviewed patient records, labs, notes, testing and imaging myself where available.  Lab Results  Component Value Date   WBC 7.9 04/11/2022   HGB 14.0 04/11/2022   HCT 41.4 04/11/2022   MCV 96.6 04/11/2022   PLT 193.0 04/11/2022      Component Value Date/Time   NA 144 04/11/2022 1213   K 4.5 04/11/2022 1213   CL 102 04/11/2022 1213   CO2 32 04/11/2022 1213   GLUCOSE 101 (H) 04/11/2022 1213   BUN 20 04/11/2022 1213   CREATININE 1.00 04/11/2022 1213   CREATININE 1.16 12/16/2019 1400   CALCIUM 10.2 04/11/2022 1213   PROT 6.8 04/11/2022 1213   PROT 6.7 01/11/2021 1144   ALBUMIN 4.3 04/11/2022 1213   AST 19 04/11/2022 1213   ALT 19 04/11/2022 1213   ALKPHOS 84 04/11/2022 1213   BILITOT 0.8 04/11/2022 1213   GFRNONAA >60 09/12/2021 1053   GFRAA >60 06/26/2019 0606   Lab Results  Component Value Date   CHOL 123 04/11/2022   HDL 49.60 04/11/2022   LDLCALC 51 04/11/2022   LDLDIRECT 84.1 05/21/2013   TRIG 109.0 04/11/2022   CHOLHDL 2 04/11/2022   Lab Results  Component Value Date   HGBA1C 6.0 (A) 12/04/2022   Lab Results  Component Value Date   VITAMINB12 >2000 (H) 07/01/2020   Lab Results  Component Value Date   TSH 2.75 04/11/2022    07/05/20 MRI brain  - Abnormal MRI  scan of the brain without contrast showing moderate changes of generalized cerebral atrophy and small vessel disease.  No acute abnormalities noted.    ASSESSMENT AND PLAN  82 y.o. year old male here with:  Dx:  1. Memory loss   2. Peripheral polyneuropathy   3. Gait abnormality     PLAN:  MEMORY LOSS - likely mild cognitive impairment (vs mild dementia) - continue donepezil - safety / supervision issues reviewed - daily physical activity / exercise (at least 15-30 minutes) - eat more plants / vegetables - increase social activities, brain stimulation, games, puzzles, hobbies, crafts, arts, music - aim for at least 7-8 hours sleep per night (or more) - avoid smoking and alcohol - caregiver resources provided (challenging as patient's wife also having mild memory issues); has good support from adult children that live nearby - caution with medications, finances, driving  GAIT DIFF / IDIOPATHIC NEUROPATHY - supportive care; use cane / walker - fall precautions reviewed  Return for return to  PCP.    Suanne Marker, MD 12/10/2022, 4:17 PM Certified in Neurology, Neurophysiology and Neuroimaging  [facility] [address], [address] [place], [place] [address] [phone]

## 2022-12-14 ENCOUNTER — Other Ambulatory Visit: Payer: Self-pay | Admitting: Family Medicine

## 2022-12-14 DIAGNOSIS — E1121 Type 2 diabetes mellitus with diabetic nephropathy: Secondary | ICD-10-CM

## 2022-12-18 DIAGNOSIS — R531 Weakness: Secondary | ICD-10-CM | POA: Diagnosis not present

## 2022-12-18 DIAGNOSIS — Z9181 History of falling: Secondary | ICD-10-CM | POA: Diagnosis not present

## 2022-12-26 DIAGNOSIS — Z9181 History of falling: Secondary | ICD-10-CM | POA: Diagnosis not present

## 2022-12-26 DIAGNOSIS — R531 Weakness: Secondary | ICD-10-CM | POA: Diagnosis not present

## 2023-01-01 ENCOUNTER — Other Ambulatory Visit (INDEPENDENT_AMBULATORY_CARE_PROVIDER_SITE_OTHER): Payer: PPO

## 2023-01-01 ENCOUNTER — Encounter: Payer: Self-pay | Admitting: Internal Medicine

## 2023-01-01 ENCOUNTER — Ambulatory Visit: Payer: PPO | Admitting: Internal Medicine

## 2023-01-01 VITALS — BP 132/74 | HR 52 | Ht 64.17 in | Wt 146.0 lb

## 2023-01-01 DIAGNOSIS — K862 Cyst of pancreas: Secondary | ICD-10-CM

## 2023-01-01 DIAGNOSIS — Z9181 History of falling: Secondary | ICD-10-CM | POA: Diagnosis not present

## 2023-01-01 DIAGNOSIS — R531 Weakness: Secondary | ICD-10-CM | POA: Diagnosis not present

## 2023-01-01 LAB — BUN: BUN: 38 mg/dL — ABNORMAL HIGH (ref 6–23)

## 2023-01-01 LAB — CREATININE, SERUM: Creatinine, Ser: 1.31 mg/dL (ref 0.40–1.50)

## 2023-01-01 NOTE — Progress Notes (Signed)
Miguel Vazquez 82 y.o. Jul 22, 1940 [id number]  Assessment & Plan:   Encounter Diagnosis  Name Primary?   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  We reviewed the pros and cons of continuing surveillance and have decided to repeat an MR MRCP.  Further plans pending that.  Orders Placed This Encounter  Procedures   MR ABDOMEN MRCP W WO CONTAST   BUN   Creatinine, serum       Subjective:   Chief Complaint: PANCREATIC CYST F/U  HPI Discussed the use of AI scribe software for clinical note transcription with the patient, who gave verbal consent to proceed.   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 The nature of the mass remains uncertain, but it does not appear to be cancerous. The mass has been monitored via regular MRIs, the most recent of which (05/2021) showed stability.   Miguel Vazquez has not experienced any abdominal pain or changes in bowel movements, such as greasy or oily stools. There has been no recent weight loss, with Miguel Vazquez's weight remaining stable since a significant loss following a heart attack many years ago.   Wife is present and participates in the history.   EXAM: MRI ABDOMEN WITHOUT AND WITH CONTRAST (INCLUDING MRCP)   TECHNIQUE: Multiplanar multisequence MR imaging of the abdomen was performed both before and after the administration of intravenous contrast. Heavily T2-weighted images of the biliary and pancreatic ducts were obtained, and three-dimensional MRCP images were rendered by post processing.   CONTRAST:  7mL GADAVIST GADOBUTROL 1 MMOL/ML IV SOLN   COMPARISON:  MRI abdomen 03/08/2021   FINDINGS: Lower chest: No acute findings.   Hepatobiliary: Liver is normal in size and contour. Several scattered small hepatic cysts are again seen measuring up to 1.7 cm in the left lobe with thin internal septations. Gallbladder  appears normal. No biliary ductal dilatation identified.   Pancreas: Redemonstration of a lobulated and multi septated cystic mass in the head of the pancreas, measuring 4 x 1.5 x 2 cm, which is not significantly changed since previous study. No new suspicious enhancing mural nodularity. MRCP images demonstrate communication with the main pancreatic duct. Stable mild dilatation of the main pancreatic duct in the head of the pancreas measuring 5 mm in diameter.   Spleen:  Within normal limits in size and appearance.   Adrenals/Urinary Tract: Adrenal glands appear normal. Chronic renal cortical scarring in the lower pole right kidney again seen. Multiple renal cortical cysts are again seen bilaterally measuring up to 3.4 cm on the right. A few hyperintense T1 signal hemorrhagic cysts measuring up to 11 mm anterior right kidney. No hydronephrosis.   Stomach/Bowel: Extensive colonic diverticulosis.   Vascular/Lymphatic: No pathologically enlarged lymph nodes identified. No abdominal aortic aneurysm demonstrated.   Other:  No ascites.   Musculoskeletal: No suspicious bone lesions identified.   IMPRESSION: 1. 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 Given stability for 2 years, follow-up MRI abdomen with contrast recommended in 1 year. 2. Hepatic cysts. 3. Multiple renal cysts including a few hemorrhagic cysts. 4. Extensive colonic diverticulosis.     Electronically Signed   By: Jannifer Hick M.D.   On: 09/21/2021 10:10   Allergies  Allergen Reactions   Food Anaphylaxis    TREE NUTS   Shellfish Allergy Anaphylaxis  Prednisone     "psychosis"   Tramadol Other (See Comments)    hallucinations   Venomil Honey Bee Venom [Honey Bee Venom] Swelling    Localized swelling   No outpatient medications have been marked as taking for the 01/01/23 encounter (Office Visit) with Iva Boop, MD.    Past Medical History:  Diagnosis Date   Acute blood loss anemia    Arthritis    Brain aneurysm 03/07/2017   followed by Dr Roda Shutters   Cataract    Chronic kidney disease    kidney stone- 40 years ago   Coronary artery disease    Cyst of pancreas 07/23/2019   Diabetes mellitus    Diverticulitis    Gait abnormality 01/11/2021   GERD (gastroesophageal reflux disease)    History of kidney stones    Hx of abdominal aortic aneurysm 1998   Hyperlipidemia    Hypertension    Lower GI bleed    Memory loss    Myocardial infarction (HCC)    Mar 08, 2015   Personal history of colonic polyps - adenomas 09/09/2013   Past Surgical History:  Procedure Laterality Date   ABDOMINAL AORTIC ANEURYSM REPAIR  1998   CARDIAC CATHETERIZATION N/A 03/18/2015   Procedure: Left Heart Cath and Coronary Angiography;  Surgeon: Peter M Swaziland, MD;  Location[facility] INVASIVE CV LAB;  Service: Cardiovascular;  Laterality: N/A;   CATARACT EXTRACTION Right    COLONOSCOPY     CORONARY ARTERY BYPASS GRAFT N/A 03/18/2015   Procedure: CORONARY ARTERY BYPASS GRAFTING (CABG) x  three, using left internal mammary artery and right leg greater saphenous vein harvested endoscopically;  Surgeon: Loreli Slot, MD;  Location: [facility] OR;  Service: Open Heart Surgery;  Laterality: N/A;   INGUINAL HERNIA REPAIR Bilateral    INTRAOPERATIVE TRANSESOPHAGEAL ECHOCARDIOGRAM N/A 03/18/2015   Procedure: INTRAOPERATIVE TRANSESOPHAGEAL ECHOCARDIOGRAM;  Surgeon: Loreli Slot, MD;  Location: [facility] OR;  Service: Open Heart Surgery;  Laterality: N/A;   REVERSE SHOULDER ARTHROPLASTY Left 09/21/2021   Procedure: REVERSE SHOULDER ARTHROPLASTY;  Surgeon: Francena Hanly, MD;  Location: [facility] ORS;  Service: Orthopedics;  Laterality: Left;    UMBILICAL HERNIA REPAIR  1965   Social History   Social History Narrative   07/17/21 Married and lives with wife in 4 story home.  Retired.      Adult son with Down syndrome died in 03/08/2019.  2 other children living.    family history includes Bone cancer in his mother; Diabetes in his paternal aunt; Down syndrome in his son; Stroke in his son.   Review of Systems See HPI  Objective:   Physical Exam BP 132/74   Pulse (!) 52   Ht 5' 4.17" (1.63 m)   Wt 146 lb (66.2 kg)   SpO2 96%   BMI 24.93 kg/m  No acute distress

## 2023-01-01 NOTE — Patient Instructions (Addendum)
_______________________________________________________  If your blood pressure at your visit was 140/90 or greater, please contact your primary care physician to follow up on this.  _______________________________________________________  If you are age 81 or older, your body mass index should be between 23-30. Your Body mass index is 24.93 kg/m. If this is out of the aforementioned range listed, please consider follow up with your Primary Care Provider.  If you are age 3 or younger, your body mass index should be between 19-25. Your Body mass index is 24.93 kg/m. If this is out of the aformentioned range listed, please consider follow up with your Primary Care Provider.   ________________________________________________________  The [facility] providers would like to encourage you to use [facility] to communicate with providers for non-urgent requests or questions.  Due to long hold times on the telephone, sending your provider a message by [facility] may be a faster and more efficient way to get a response.  Please allow 48 business hours for a response.  Please remember that this is for non-urgent requests.  _______________________________________________________   Your provider has requested that you go to the basement level for lab work before leaving today. Press "B" on the elevator. The lab is located at the first door on the left as you exit the elevator.  You have been scheduled for an MRI at [facility]  on 01/09/2023. Your appointment time is 10:00am. Please arrive to admitting (at main entrance of the hospital) 30 minutes prior to your appointment time for registration purposes. Please make certain not to have anything to eat or drink 4 hours prior to your test. In addition, if you have any metal in your body, have a pacemaker or defibrillator, please be sure to let your ordering physician know. This test typically takes 45 minutes to 1 hour to complete. Should you need to  reschedule, please call [phone] to do so.  I appreciate the opportunity to care for you. Stan Head, MD, [facility]

## 2023-01-03 DIAGNOSIS — R531 Weakness: Secondary | ICD-10-CM | POA: Diagnosis not present

## 2023-01-03 DIAGNOSIS — Z9181 History of falling: Secondary | ICD-10-CM | POA: Diagnosis not present

## 2023-01-04 ENCOUNTER — Other Ambulatory Visit (HOSPITAL_BASED_OUTPATIENT_CLINIC_OR_DEPARTMENT_OTHER): Payer: Self-pay

## 2023-01-07 DIAGNOSIS — R531 Weakness: Secondary | ICD-10-CM | POA: Diagnosis not present

## 2023-01-07 DIAGNOSIS — Z9181 History of falling: Secondary | ICD-10-CM | POA: Diagnosis not present

## 2023-01-09 ENCOUNTER — Other Ambulatory Visit: Payer: Self-pay | Admitting: Internal Medicine

## 2023-01-09 ENCOUNTER — Ambulatory Visit (HOSPITAL_COMMUNITY)
Admission: RE | Admit: 2023-01-09 | Discharge: 2023-01-09 | Disposition: A | Discharge status: Home / Self Care | Payer: PPO | Source: Ambulatory Visit | Attending: Internal Medicine | Admitting: Internal Medicine

## 2023-01-09 DIAGNOSIS — K862 Cyst of pancreas: Secondary | ICD-10-CM

## 2023-01-09 DIAGNOSIS — K5730 Diverticulosis of large intestine without perforation or abscess without bleeding: Secondary | ICD-10-CM | POA: Diagnosis not present

## 2023-01-09 DIAGNOSIS — I700 Atherosclerosis of aorta: Secondary | ICD-10-CM | POA: Diagnosis not present

## 2023-01-09 DIAGNOSIS — N281 Cyst of kidney, acquired: Secondary | ICD-10-CM | POA: Diagnosis not present

## 2023-01-09 MED ORDER — GADOBUTROL 1 MMOL/ML IV SOLN
6.0000 mL | Freq: Once | INTRAVENOUS | Status: AC | PRN
  Administered 2023-01-09: 6 mL via INTRAVENOUS

## 2023-01-11 ENCOUNTER — Ambulatory Visit: Payer: PPO

## 2023-01-11 VITALS — BP 118/62 | HR 60 | Temp 98.4°F | Ht 64.0 in | Wt 148.9 lb

## 2023-01-11 DIAGNOSIS — Z0000 Encounter for general adult medical examination without abnormal findings: Secondary | ICD-10-CM | POA: Diagnosis not present

## 2023-01-11 NOTE — Progress Notes (Signed)
Subjective:   Miguel Vazquez is a 82 y.o. male who presents for Medicare Annual/Subsequent preventive examination.  Visit Complete: In person    Cardiac Risk Factors include: advanced age (>46men, >24 women);male gender;diabetes mellitus;hypertension     Objective:    Today's Vitals   01/11/23 1256  BP: 118/62  Pulse: 60  Temp: 98.4 F (36.9 C)  TempSrc: Oral  SpO2: 98%  Weight: 148 lb 14.4 oz (67.5 kg)  Height: 5\' 4"  (1.626 m)   Body mass index is 25.56 kg/m.     01/11/2023    1:35 PM 01/04/2022    1:54 PM 09/12/2021   11:23 AM 12/20/2020    3:16 PM 11/25/2019    1:25 PM 06/24/2019    6:32 PM 02/16/2019    3:42 PM  Advanced Directives  Does Patient Have a Medical Advance Directive? Yes Yes Yes Yes Yes Yes Yes  Type of Estate agent of [place];Living will Healthcare Power of [place];Living will Living will;Healthcare Power of [employer] Power of [employer] Power of [place];Living will Healthcare Power of [place];Living will Healthcare Power of [place];Living will  Does patient want to make changes to medical advance directive? No - Patient declined No - Patient declined   No - Patient declined    Copy of Healthcare Power of Attorney in Chart? Yes - validated most recent copy scanned in chart (See row information) Yes - validated most recent copy scanned in chart (See row information)  Yes - validated most recent copy scanned in chart (See row information) Yes - validated most recent copy scanned in chart (See row information)  No - copy requested    Current Medications (verified) Outpatient Encounter Medications as of 01/11/2023  Medication Sig   aspirin EC 81 MG tablet Take 1 tablet (81 mg total) by mouth daily.   atorvastatin (LIPITOR) 80 MG tablet TAKE 1 TABLET BY MOUTH DAILY AT 6 PM.   carvedilol (COREG) 6.25 MG tablet TAKE 1 TABLET BY MOUTH TWICE A DAY   donepezil (ARICEPT) 10 MG tablet TAKE 1 TABLET BY MOUTH EVERYDAY AT  BEDTIME   EPINEPHrine 0.3 mg/0.3 mL IJ SOAJ injection Inject 0.3 mLs (0.3 mg total) into the muscle as needed for anaphylaxis.   escitalopram (LEXAPRO) 10 MG tablet TAKE 1 TABLET BY MOUTH EVERY DAY   ferrous sulfate 324 MG TBEC Take 324 mg by mouth daily.   glucose blood (FREESTYLE LITE) test strip Check Blood Sugars 1-2 times per day. DX: E11.9.   Lancets (FREESTYLE) lancets Check Blood Sugars 1-2 times per day. DX: E11.9   levothyroxine (SYNTHROID) 75 MCG tablet TAKE 1 TABLET BY MOUTH EVERY DAY   losartan (COZAAR) 25 MG tablet Take 1 tablet (25 mg total) by mouth daily.   metFORMIN (GLUCOPHAGE) 1000 MG tablet TAKE 1 TABLET BY MOUTH TWICE A DAY   Multiple Vitamin (MULTIVITAMIN) tablet Take 1 tablet by mouth daily. Centrium Silver once daily   vitamin B-12 (CYANOCOBALAMIN) 1000 MCG tablet Take 1,000 mcg by mouth daily.   vitamin C (ASCORBIC ACID) 500 MG tablet Take 500 mg by mouth every evening.    No facility-administered encounter medications on file as of 01/11/2023.    Allergies (verified) Food, Shellfish allergy, Prednisone, Tramadol, and Venomil honey bee venom [honey bee venom]   History: Past Medical History:  Diagnosis Date   Acute blood loss anemia    Arthritis    Brain aneurysm 2018   followed by Dr Roda Shutters   Cataract  Chronic kidney disease    kidney stone- 40 years ago   Coronary artery disease    Cyst of pancreas 07/23/2019   Diabetes mellitus    Diverticulitis    Gait abnormality 01/11/2021   GERD (gastroesophageal reflux disease)    History of kidney stones    Hx of abdominal aortic aneurysm 1998   Hyperlipidemia    Hypertension    Lower GI bleed    Memory loss    Myocardial infarction [facility])    02/10/2015   Personal history of colonic polyps - adenomas 09/09/2013   Past Surgical History:  Procedure Laterality Date   ABDOMINAL AORTIC ANEURYSM REPAIR  1998   CARDIAC CATHETERIZATION N/A 03/18/2015   Procedure: Left Heart Cath and Coronary Angiography;  Surgeon:  Peter M Swaziland, MD;  Location: [facility] INVASIVE CV LAB;  Service: Cardiovascular;  Laterality: N/A;   CATARACT EXTRACTION Right    COLONOSCOPY     CORONARY ARTERY BYPASS GRAFT N/A 03/18/2015   Procedure: CORONARY ARTERY BYPASS GRAFTING (CABG) x  three, using left internal mammary artery and right leg greater saphenous vein harvested endoscopically;  Surgeon: Loreli Slot, MD;  Location: [facility] OR;  Service: Open Heart Surgery;  Laterality: N/A;   INGUINAL HERNIA REPAIR Bilateral    INTRAOPERATIVE TRANSESOPHAGEAL ECHOCARDIOGRAM N/A 03/18/2015   Procedure: INTRAOPERATIVE TRANSESOPHAGEAL ECHOCARDIOGRAM;  Surgeon: Loreli Slot, MD;  Location: [facility] OR;  Service: Open Heart Surgery;  Laterality: N/A;   REVERSE SHOULDER ARTHROPLASTY Left 09/21/2021   Procedure: REVERSE SHOULDER ARTHROPLASTY;  Surgeon: Francena Hanly, MD;  Location: [facility] ORS;  Service: Orthopedics;  Laterality: Left;    UMBILICAL HERNIA REPAIR  1965   Family History  Problem Relation Age of Onset   Bone cancer Mother    Diabetes Paternal Aunt    Stroke Son    Down syndrome Son    Colon cancer Neg Hx    Esophageal cancer Neg Hx    Rectal cancer Neg Hx    Stomach cancer Neg Hx    Social History   Socioeconomic History   Marital status: Married    Spouse name: Alona Bene   Number of children: 3   Years of education: 10   Highest education level: 10th grade  Occupational History   Not on file  Tobacco Use   Smoking status: Former    Current packs/day: 0.00    Average packs/day: 1.5 packs/day for 20.0 years (30.0 ttl pk-yrs)    Types: Cigarettes    Start date: 08/24/1966    Quit date: 08/24/1986    Years since quitting: 36.4   Smokeless tobacco: Never  Vaping Use   Vaping status: Never Used  Substance and Sexual Activity   Alcohol use: Yes    Comment: occ   Drug use: No   Sexual activity: Not on file  Other Topics Concern   Not on file  Social History Narrative   07/17/21 Married and lives with wife in 4 story  home.  Retired.      Adult son with Down syndrome died in 10-Feb-2019.  2 other children living.   Social Determinants of Health   Financial Resource Strain: Low Risk  (01/11/2023)   Overall Financial Resource Strain (CARDIA)    Difficulty of Paying Living Expenses: Not hard at all  Food Insecurity: No Food Insecurity (01/11/2023)   Hunger Vital Sign    Worried About Running Out of Food in the Last Year: Never true    Ran Out of Food in the Last  Year: Never true  Transportation Needs: No Transportation Needs (01/11/2023)   PRAPARE - Administrator, Civil Service (Medical): No    Lack of Transportation (Non-Medical): No  Physical Activity: Insufficiently Active (01/11/2023)   Exercise Vital Sign    Days of Exercise per Week: 2 days    Minutes of Exercise per Session: 60 min  Stress: No Stress Concern Present (01/11/2023)   [employer] of Occupational Health - Occupational Stress Questionnaire    Feeling of Stress : Not at all  Social Connections: Socially Integrated (01/11/2023)   Social Connection and Isolation Panel [NHANES]    Frequency of Communication with Friends and Family: More than three times a week    Frequency of Social Gatherings with Friends and Family: More than three times a week    Attends Religious Services: More than 4 times per year    Active Member of [employer] or Organizations: Yes    Attends Engineer, structural: More than 4 times per year    Marital Status: Married    Tobacco Counseling Counseling given: Not Answered   Clinical Intake:  Pre-visit preparation completed: Yes  Pain : No/denies pain     BMI - recorded: 25.56 Nutritional Status: BMI 25 -29 Overweight Nutritional Risks: None Diabetes: Yes CBG done?: No Did pt. bring in CBG monitor from home?: No  How often do you need to have someone help you when you read instructions, pamphlets, or other written materials from your doctor or pharmacy?: 1 - Never  Interpreter  Needed?: No  Information entered by :: Theresa Mulligan LPN   Activities of Daily Living    01/11/2023    1:33 PM  In your present state of health, do you have any difficulty performing the following activities:  Hearing? 0  Vision? 0  Difficulty concentrating or making decisions? 0  Walking or climbing stairs? 0  Dressing or bathing? 0  Doing errands, shopping? 0  Preparing Food and eating ? N  Using the Toilet? N  In the past six months, have you accidently leaked urine? N  Do you have problems with loss of bowel control? Y  Comment Pending Gastrologist appt  Managing your Medications? N  Managing your Finances? N  Housekeeping or managing your Housekeeping? N    Patient Care Team: Kristian Covey, MD as PCP - General Swaziland, Peter M, MD as PCP - Cardiology (Cardiology) Karie Soda, MD as Consulting Physician (General Surgery) Iva Boop, MD as Consulting Physician (Gastroenterology) Swaz[place], Peter M, MD as Consulting Physician (Cardiology) Loreli Slot, MD as Consulting Physician (Cardiothoracic Surgery) Lutricia Feil, [facility] as Pharmacist (Pharmacist)  Indicate any recent Medical Services you may have received from other than [facility] providers in the past year (date may be approximate).     Assessment:   This is a routine wellness examination for Gina.  Hearing/Vision screen Hearing Screening - Comments:: Denies hearing difficulties   Vision Screening - Comments:: Wears rx glasses - up to date with routine eye exams with  Deferred   Goals Addressed               This Visit's Progress     Increase physical activity (pt-stated)         Depression Screen    01/11/2023    1:01 PM 01/04/2022    1:49 PM 05/10/2021    3:51 PM 02/20/2021   11:39 AM 12/20/2020    3:15 PM 02/22/2020    4:00 PM 11/25/2019  1:29 PM  PHQ 2/9 Scores  PHQ - 2 Score 0 0 3 0 0 0 0  PHQ- 9 Score   6    0    Fall Risk    01/11/2023    1:34 PM 01/04/2022    1:52  PM 08/29/2021   10:19 AM 07/17/2021    2:10 PM 05/10/2021    3:51 PM  Fall Risk   Falls in the past year? 0 1 1 1  0  Number falls in past yr: 0 0 1 1   Injury with Fall? 0 0 1 0   Comment  No injury or medical attention needed     Risk for fall due to : No Fall Risks No Fall Risks No Fall Risks    Follow up Falls prevention discussed  Falls evaluation completed      MEDICARE RISK AT HOME: Medicare Risk at Home Any stairs in or around the home?: Yes If so, are there any without handrails?: No Home free of loose throw rugs in walkways, pet beds, electrical cords, etc?: Yes Adequate lighting in your home to reduce risk of falls?: Yes Life alert?: No Use of a cane, walker or w/c?: Yes Grab bars in the bathroom?: Yes Shower chair or bench in shower?: Yes Elevated toilet seat or a handicapped toilet?: Yes  TIMED UP AND GO:  Was the test performed?  Yes  Length of time to ambulate 10 feet: 10 sec Gait slow and steady without use of assistive device    Cognitive Function:    07/17/2022    2:24 PM 07/17/2021    2:12 PM 01/11/2021   11:25 AM 07/01/2020   11:45 AM 09/11/2017   11:50 AM  MMSE - Mini Mental State Exam  Not completed:     --  Orientation to time 1 2 3 2    Orientation to Place 5 4 4 3    Registration 3 3 3 3    Attention/ Calculation 1 1 2 1    Recall 3 3 2 2    Language- name 2 objects 2 2 2 2    Language- repeat 1 0 1 1   Language- follow 3 step command 3 3 3 3    Language- read & follow direction 1 1 1 1    Write a sentence 1 1 1 1    Copy design 1 1 0 0   Total score 22 21 22 19          01/11/2023    1:35 PM 01/04/2022    1:54 PM 12/20/2020    3:26 PM 11/25/2019    1:31 PM  6CIT Screen  What Year? 0 points 4 points 0 points 0 points  What month? 0 points 0 points 3 points 0 points  What time? 0 points 0 points 0 points 0 points  Count back from 20 4 points 2 points 0 points 2 points  Months in reverse 4 points 4 points 4 points 4 points  Repeat phrase 6 points 4  points 10 points 10 points  Total Score 14 points 14 points 17 points 16 points    Immunizations Immunization History  Administered Date(s) Administered   Fluad Quad(high Dose 65+) 12/25/2018, 12/25/2018, 02/20/2021, 01/04/2022   Influenza Split 01/15/2011   Influenza, High Dose Seasonal PF 12/12/2016, 02/06/2018   Influenza,inj,Quad PF,6+ Mos 11/18/2013, 01/07/2016, 12/16/2019   Influenza-Unspecified 02/06/2018   PFIZER Comirnaty(Gray Top)Covid-19 Tri-Sucrose Vaccine 09/05/2020   PFIZER(Purple Top)SARS-COV-2 Vaccination 05/31/2019, 06/30/2019, 01/21/2020   Pneumococcal Conjugate-13 05/21/2014   Pneumococcal Polysaccharide-23  11/18/2012   Tdap 02/16/2019   Zoster, Live 05/21/2014    TDAP status: Up to date  Flu Vaccine status: Due, Education has been provided regarding the importance of this vaccine. Advised may receive this vaccine at local pharmacy or Health Dept. Aware to provide a copy of the vaccination record if obtained from local pharmacy or Health Dept. Verbalized acceptance and understanding.  Pneumococcal vaccine status: Up to date  Covid-19 vaccine status: Declined, Education has been provided regarding the importance of this vaccine but patient still declined. Advised may receive this vaccine at local pharmacy or Health Dept.or vaccine clinic. Aware to provide a copy of the vaccination record if obtained from local pharmacy or Health Dept. Verbalized acceptance and understanding.  Qualifies for Shingles Vaccine? Yes   Zostavax completed No   Shingrix Completed?: No.    Education has been provided regarding the importance of this vaccine. Patient has been advised to call insurance company to determine out of pocket expense if they have not yet received this vaccine. Advised may also receive vaccine at local pharmacy or Health Dept. Verbalized acceptance and understanding.  Screening Tests Health Maintenance  Topic Date Due   Zoster Vaccines- Shingrix (1 of 2) 01/24/1992    Diabetic kidney evaluation - Urine ACR  11/22/2010   FOOT EXAM  02/20/2022   OPHTHALMOLOGY EXAM  06/13/2022   INFLUENZA VACCINE  11/01/2022   COVID-19 Vaccine (5 - 2023-24 season) 12/02/2022   HEMOGLOBIN A1C  06/03/2023   Diabetic kidney evaluation - eGFR measurement  01/01/2024   Medicare Annual Wellness (AWV)  01/11/2024   DTaP/Tdap/Td (2 - Td or Tdap) 02/15/2029   Pneumonia Vaccine 54+ Years old  Completed   HPV VACCINES  Aged Out   Hepatitis C Screening  Discontinued    Health Maintenance  Health Maintenance Due  Topic Date Due   Zoster Vaccines- Shingrix (1 of 2) 01/24/1992   Diabetic kidney evaluation - Urine ACR  11/22/2010   FOOT EXAM  02/20/2022   OPHTHALMOLOGY EXAM  06/13/2022   INFLUENZA VACCINE  11/01/2022   COVID-19 Vaccine (5 - 2023-24 season) 12/02/2022        Additional Screening:    Vision Screening: Recommended annual ophthalmology exams for early detection of glaucoma and other disorders of the eye. Is the patient up to date with their annual eye exam?  Yes  Who is the provider or what is the name of the office in which the patient attends annual eye exams? Deferred If pt is not established with a provider, would they like to be referred to a provider to establish care? No .   Dental Screening: Recommended annual dental exams for proper oral hygiene  Diabetic Foot Exam: Diabetic Foot Exam: Overdue, Pt has been advised about the importance in completing this exam. Pt is scheduled for diabetic foot exam on Followed by PCP.  Community Resource Referral / Chronic Care Management:  CRR required this visit?  No   CCM required this visit?  No     Plan:     I have personally reviewed and noted the following in the patient's chart:   Medical and social history Use of alcohol, tobacco or illicit drugs  Current medications and supplements including opioid prescriptions. Patient is not currently taking opioid prescriptions. Functional ability and  status Nutritional status Physical activity Advanced directives List of other physicians Hospitalizations, surgeries, and ER visits in previous 12 months Vitals Screenings to include cognitive, depression, and falls Referrals and appointments  In addition,  I have reviewed and discussed with patient certain preventive protocols, quality metrics, and best practice recommendations. A written personalized care plan for preventive services as well as general preventive health recommendations were provided to patient.     Tillie Rung, LPN   81/19/1478   After Visit Summary: Given  Nurse Notes: None

## 2023-01-11 NOTE — Patient Instructions (Addendum)
Miguel Vazquez , Thank you for taking time to come for your Medicare Wellness Visit. I appreciate your ongoing commitment to your health goals. Please review the following plan we discussed and let me know if I can assist you in the future.   Referrals/Orders/Follow-Ups/Clinician Recommendations:   This is a list of the screening recommended for you and due dates:  Health Maintenance  Topic Date Due   Zoster (Shingles) Vaccine (1 of 2) 01/24/1992   Yearly kidney health urinalysis for diabetes  11/22/2010   Complete foot exam   02/20/2022   Eye exam for diabetics  06/13/2022   Flu Shot  11/01/2022   COVID-19 Vaccine (5 - 2023-24 season) 12/02/2022   Hemoglobin A1C  06/03/2023   Yearly kidney function blood test for diabetes  01/01/2024   Medicare Annual Wellness Visit  01/11/2024   DTaP/Tdap/Td vaccine (2 - Td or Tdap) 02/15/2029   Pneumonia Vaccine  Completed   HPV Vaccine  Aged Out   Hepatitis C Screening  Discontinued    Advanced directives:   Next Medicare Annual Wellness Visit scheduled for next year: Yes

## 2023-01-14 DIAGNOSIS — R531 Weakness: Secondary | ICD-10-CM | POA: Diagnosis not present

## 2023-01-14 DIAGNOSIS — Z9181 History of falling: Secondary | ICD-10-CM | POA: Diagnosis not present

## 2023-01-16 DIAGNOSIS — Z9181 History of falling: Secondary | ICD-10-CM | POA: Diagnosis not present

## 2023-01-16 DIAGNOSIS — R531 Weakness: Secondary | ICD-10-CM | POA: Diagnosis not present

## 2023-01-19 ENCOUNTER — Other Ambulatory Visit: Payer: Self-pay | Admitting: Family Medicine

## 2023-01-21 ENCOUNTER — Telehealth: Payer: Self-pay | Admitting: Family Medicine

## 2023-01-21 MED ORDER — ESCITALOPRAM OXALATE 10 MG PO TABS: 10.0000 mg | ORAL_TABLET | Freq: Every day | ORAL | 0 refills | Status: DC

## 2023-01-21 NOTE — Telephone Encounter (Signed)
Prescription Request  01/21/2023  LOV: 12/04/2022  What is the name of the medication or equipment? escitalopram escitalopram (LEXAPRO) 10 MG tablet  Have you contacted your pharmacy to request a refill? Yes   Which pharmacy would you like this sent to?  [id number] - [place], [place] - [address] [place] [address] Phone: [phone] Fax: [phone]    Patient notified that their request is being sent to the clinical staff for review and that they should receive a response within 2 business days.   Please advise at Mobile [phone] (mobile)

## 2023-01-21 NOTE — Telephone Encounter (Signed)
Rx sent 

## 2023-01-26 ENCOUNTER — Other Ambulatory Visit: Payer: Self-pay | Admitting: Cardiology

## 2023-01-28 NOTE — Telephone Encounter (Signed)
Pt is overdue for followup with Dr. Swaziland. He has already had 3 attempts. Is this something that Dr. Swaziland wishes to refill? Please advise.

## 2023-01-30 DIAGNOSIS — R531 Weakness: Secondary | ICD-10-CM | POA: Diagnosis not present

## 2023-01-30 DIAGNOSIS — Z9181 History of falling: Secondary | ICD-10-CM | POA: Diagnosis not present

## 2023-02-10 ENCOUNTER — Other Ambulatory Visit: Payer: Self-pay | Admitting: Family Medicine

## 2023-02-10 DIAGNOSIS — E785 Hyperlipidemia, unspecified: Secondary | ICD-10-CM

## 2023-02-24 NOTE — Progress Notes (Unsigned)
02/26/2023 Miguel Vazquez   February 15, 1941  [id number]  Primary Physician [name], Elberta Fortis, MD Primary Cardiologist: Dr Dilan Novosad [place]  HPI:  82 y/o male seen for follow up CAD. He also needs preop Clearance for rotator cuff surgery. He has a history of DM, HTN, CRI, and PVD s/p remote AAA R&G, admitted 03/18/15 with a STEMI. At cath he had severe 3V and left main CAD. EF 20--25%. He went for urgent CABG. He tolerated this well. Post op he had PAF and was placed on Amiodarone. Amiodarone has since been discontinued. Echo in January 2017 showed EF 35-40%. He was placed on lisinopril but developed hyperkalemia so this was stopped. On his follow up we resumed at a lower dose and potassium levels have been normal.   He was admitted in March 2021 with lower GI bleed due to diverticulitis and diverticular bleed. Treated with antibiotics.   Was seen by Gillian Shields PA-C in May. Doing well at that time. Echocardiogram 07/08/2020 with hypokinesis of the inferobasal and dyskinesis of basal anterolateral wall, LVEF 40 to 45%, mild aortic stenosis. Evaluated by Neurology with gait disturbance. MRI showed cortical atrophy. EMG c/w axonal polyneuropathy.   Echo done last year was unchanged from prior with EF 45-50%.   On follow up today he is doing well from a cardiac standpoint. No chest pain, dyspnea, palpitations, edema. He still has difficulty with balance and has fallen several times. In the morning he does note some dizziness when he first gets up. Just sits on the side of the bed and it resolves in 30 seconds.      Current Outpatient Medications  Medication Sig Dispense Refill   aspirin EC 81 MG tablet Take 1 tablet (81 mg total) by mouth daily. 90 tablet 3   atorvastatin (LIPITOR) 80 MG tablet TAKE 1 TABLET BY MOUTH DAILY AT 6 PM. 90 tablet 0   carvedilol (COREG) 6.25 MG tablet TAKE 1 TABLET BY MOUTH TWICE A DAY 90 tablet 0   donepezil (ARICEPT) 10 MG tablet TAKE 1 TABLET BY MOUTH EVERYDAY AT  BEDTIME 90 tablet 0   EPINEPHrine 0.3 mg/0.3 mL IJ SOAJ injection Inject 0.3 mLs (0.3 mg total) into the muscle as needed for anaphylaxis. 2 each 3   escitalopram (LEXAPRO) 10 MG tablet Take 1 tablet (10 mg total) by mouth daily. 90 tablet 0   ferrous sulfate 324 MG TBEC Take 324 mg by mouth daily.     glucose blood (FREESTYLE LITE) test strip Check Blood Sugars 1-2 times per day. DX: E11.9. 100 each 5   Lancets (FREESTYLE) lancets Check Blood Sugars 1-2 times per day. DX: E11.9 100 each 5   levothyroxine (SYNTHROID) 75 MCG tablet TAKE 1 TABLET BY MOUTH EVERY DAY 90 tablet 0   losartan (COZAAR) 25 MG tablet Take 1 tablet (25 mg total) by mouth daily. 90 tablet 3   metFORMIN (GLUCOPHAGE) 1000 MG tablet TAKE 1 TABLET BY MOUTH TWICE A DAY 180 tablet 0   Multiple Vitamin (MULTIVITAMIN) tablet Take 1 tablet by mouth daily. Centrium Silver once daily     vitamin B-12 (CYANOCOBALAMIN) 1000 MCG tablet Take 1,000 mcg by mouth daily.     vitamin C (ASCORBIC ACID) 500 MG tablet Take 500 mg by mouth every evening.      No current facility-administered medications for this visit.    Allergies  Allergen Reactions   Food Anaphylaxis    TREE NUTS   Shellfish Allergy Anaphylaxis   Prednisone     "  psychosis"   Tramadol Other (See Comments)    hallucinations   Venomil Honey Bee Venom [Honey Bee Venom] Swelling    Localized swelling    Social History   Socioeconomic History   Marital status: Married    Spouse name: Alona Bene   Number of children: 3   Years of education: 10   Highest education level: 10th grade  Occupational History   Not on file  Tobacco Use   Smoking status: Former    Current packs/day: 0.00    Average packs/day: 1.5 packs/day for 20.0 years (30.0 ttl pk-yrs)    Types: Cigarettes    Start date: 08/24/1966    Quit date: 08/24/1986    Years since quitting: 36.5   Smokeless tobacco: Never  Vaping Use   Vaping status: Never Used  Substance and Sexual Activity   Alcohol use: Yes     Comment: occ   Drug use: No   Sexual activity: Not on file  Other Topics Concern   Not on file  Social History Narrative   07/17/21 Married and lives with wife in 4 story home.  Retired.      Adult son with Down syndrome died in 2019/03/11.  2 other children living.   Social Determinants of Health   Financial Resource Strain: Low Risk  (01/11/2023)   Overall Financial Resource Strain (CARDIA)    Difficulty of Paying Living Expenses: Not hard at all  Food Insecurity: No Food Insecurity (01/11/2023)   Hunger Vital Sign    Worried About Running Out of Food in the Last Year: Never true    Ran Out of Food in the Last Year: Never true  Transportation Needs: No Transportation Needs (01/11/2023)   PRAPARE - Administrator, Civil Service (Medical): No    Lack of Transportation (Non-Medical): No  Physical Activity: Insufficiently Active (01/11/2023)   Exercise Vital Sign    Days of Exercise per Week: 2 days    Minutes of Exercise per Session: 60 min  Stress: No Stress Concern Present (01/11/2023)   [employer] of Occupational Health - Occupational Stress Questionnaire    Feeling of Stress : Not at all  Social Connections: Socially Integrated (01/11/2023)   Social Connection and Isolation Panel [NHANES]    Frequency of Communication with Friends and Family: More than three times a week    Frequency of Social Gatherings with Friends and Family: More than three times a week    Attends Religious Services: More than 4 times per year    Active Member of [employer] or Organizations: Yes    Attends Engineer, structural: More than 4 times per year    Marital Status: Married  Catering manager Violence: Not At Risk (01/11/2023)   Humiliation, Afraid, Rape, and Kick questionnaire    Fear of Current or Ex-Partner: No    Emotionally Abused: No    Physically Abused: No    Sexually Abused: No     Review of Systems: As noted in HPI All other systems reviewed and are otherwise  negative except as noted above.    Blood pressure 124/70, pulse 68, height 5\' 7"  (1.702 m), weight 144 lb (65.3 kg), SpO2 96%.  GENERAL:  Well appearing pale WM in NAD HEENT:  PERRL, EOMI, sclera are clear. Oropharynx is clear. NECK:  No jugular venous distention, carotid upstroke brisk and symmetric, no bruits, no thyromegaly or adenopathy LUNGS:  Clear to auscultation bilaterally CHEST:  Unremarkable HEART:  RRR,  PMI not displaced  or sustained,S1 and S2 within normal limits, no S3, no S4: no clicks, no rubs, soft 1-2/6 systolic murmur LSB ABD:  Soft, nontender. BS +, no masses or bruits. No hepatomegaly, no splenomegaly EXT:  2 + pulses throughout, no edema, no cyanosis no clubbing SKIN:  Warm and dry.  No rashes NEURO:  Alert and oriented x 3. Cranial nerves II through XII intact. PSYCH:  Cognitively intact   EKG Interpretation Date/Time:  Tuesday February 26 2023 15:17:37 EST Ventricular Rate:  68 PR Interval:  148 QRS Duration:  120 QT Interval:  406 QTC Calculation: 431 R Axis:   -14  Text Interpretation: Normal sinus rhythm Inferior infarct , age undetermined When compared with ECG of 27-May-2020 17:19, No significant change was found Confirmed by [place], Kinzley Savell [phone]) on 02/26/2023 3:30:35 PM     Lab Results  Component Value Date   WBC 7.9 04/11/2022   HGB 14.0 04/11/2022   HCT 41.4 04/11/2022   PLT 193.0 04/11/2022   GLUCOSE 101 (H) 04/11/2022   CHOL 123 04/11/2022   TRIG 109.0 04/11/2022   HDL 49.60 04/11/2022   LDLDIRECT 84.1 05/21/2013   LDLCALC 51 04/11/2022   ALT 19 04/11/2022   AST 19 04/11/2022   NA 144 04/11/2022   K 4.5 04/11/2022   CL 102 04/11/2022   CREATININE 1.31 01/01/2023   BUN 38 (H) 01/01/2023   CO2 32 04/11/2022   TSH 2.75 04/11/2022   INR 1.63 (H) 03/18/2015   HGBA1C 6.0 (A) 12/04/2022   MICROALBUR 6.8 (H) 11/21/2009   Echo: 12/13/15: Study Conclusions   - Left ventricle: The cavity size was normal. Wall thickness was   increased  in a pattern of mild LVH. Systolic function was mildly   to moderately reduced. The estimated ejection fraction was in the   range of 40% to 45%. Diffuse hypokinesis. There is dyskinesis of   the basalinferolateral myocardium. Doppler parameters are   consistent with abnormal left ventricular relaxation (grade 1   diastolic dysfunction). - Mitral valve: Calcified annulus. - Left atrium: The atrium was mildly dilated. - Pulmonary arteries: Systolic pressure was mildly increased. PA   peak pressure: 32 mm Hg (S).   Impressions:   - Global hypokinesis and dyskinesis of the basal inferior lateral   wall; overall mild to moderate LV dysfunction; grade 1 diastolic   dysfunction; mild LAE; mild TR with mildly elevated pulmonary   pressure.   Echo 07/08/2020  1. Hypokinesis of the inferobasal wall and dyskinesis of the basal  inferolateral wall; overall mild to moderate LV dysfunction; mild AS.   2. Left ventricular ejection fraction, by estimation, is 40 to 45%. The  left ventricle has mild to moderately decreased function. The left  ventricle demonstrates regional wall motion abnormalities (see scoring  diagram/findings for description). There is   mild left ventricular hypertrophy. Left ventricular diastolic parameters  are consistent with Grade I diastolic dysfunction (impaired relaxation).  The average left ventricular global longitudinal strain is -12.9 %. The  global longitudinal strain is  abnormal.   3. Right ventricular systolic function is normal. The right ventricular  size is normal.   4. The mitral valve is normal in structure. Trivial mitral valve  regurgitation. No evidence of mitral stenosis.   5. The aortic valve is tricuspid. Aortic valve regurgitation is not  visualized. Mild aortic valve stenosis.   Echo 08/22/21: IMPRESSIONS     1. Left ventricular ejection fraction, by estimation, is 45 to 50%. The  left ventricle  has mildly decreased function. The left ventricle   demonstrates regional wall motion abnormalities (see scoring  diagram/findings for description). Left ventricular  diastolic parameters are consistent with Grade I diastolic dysfunction  (impaired relaxation). There is severe hypokinesis of the left  ventricular, basal-mid lateral wall, inferolateral wall and inferior wall,  with mild dyskinesis in the basal  inferolateral wall.   2. Right ventricular systolic function is mildly reduced. The right  ventricular size is normal.   3. The mitral valve is normal in structure. Trivial mitral valve  regurgitation. No evidence of mitral stenosis. Moderate mitral annular  calcification.   4. The aortic valve is tricuspid. There is moderate calcification of the  aortic valve. There is moderate thickening of the aortic valve. Aortic  valve regurgitation is not visualized. Mild aortic valve stenosis.   Comparison(s): No significant change from prior study. Prior images  reviewed side by side.   ASSESSMENT AND PLAN:  1. CAD s/p STEMI in December 2016. Urgent CABG for severe 3 vessel and left main disease. He is asymptomatic.  Recommend continue ASA to 81 mg daily, continue beta blocker, and statin.  2. Ischemic cardiomyopathy. EF improved to 45-50%. Well compensated. Will continue Coreg  6.25 mg bid and losartan.  3. Hyperlipidemia. On lipitor. Excellent control. LDL 52.  4. Post op Afib. Resolved. No recurrence  5. DM type 2 with renal manifestations. Last A1c 6.0%. 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 Last creatinine 1.3.   7. S/p AAA repair.- remote  8. Diverticular bleed with anemia. No recurrence.  9. Mild aortic stenosis- no change on Echo.   I will follow up in one year.    Maleaha Hughett [place] MD,FACC  02/26/2023 3:37 PM

## 2023-02-26 ENCOUNTER — Ambulatory Visit: Payer: PPO | Attending: Cardiology | Admitting: Cardiology

## 2023-02-26 ENCOUNTER — Encounter: Payer: Self-pay | Admitting: Cardiology

## 2023-02-26 VITALS — BP 124/70 | HR 68 | Ht 67.0 in | Wt 144.0 lb

## 2023-02-26 DIAGNOSIS — Z951 Presence of aortocoronary bypass graft: Secondary | ICD-10-CM | POA: Diagnosis not present

## 2023-02-26 DIAGNOSIS — I25118 Atherosclerotic heart disease of native coronary artery with other forms of angina pectoris: Secondary | ICD-10-CM

## 2023-02-26 DIAGNOSIS — I350 Nonrheumatic aortic (valve) stenosis: Secondary | ICD-10-CM | POA: Diagnosis not present

## 2023-02-26 DIAGNOSIS — I10 Essential (primary) hypertension: Secondary | ICD-10-CM

## 2023-02-26 DIAGNOSIS — E785 Hyperlipidemia, unspecified: Secondary | ICD-10-CM | POA: Diagnosis not present

## 2023-02-26 NOTE — Patient Instructions (Signed)
 Medication Instructions:  Continue same medications *If you need a refill on your cardiac medications before your next appointment, please call your pharmacy*   Lab Work: None ordered   Testing/Procedures: None ordered   Follow-Up: At [facility], you and your health needs are our priority.  As part of our continuing mission to provide you with exceptional heart care, we have created designated Provider Care Teams.  These Care Teams include your primary Cardiologist (physician) and Advanced Practice Providers (APPs -  Physician Assistants and Nurse Practitioners) who all work together to provide you with the care you need, when you need it.  We recommend signing up for the patient portal called "MyChart".  Sign up information is provided on this After Visit Summary.  MyChart is used to connect with patients for Virtual Visits (Telemedicine).  Patients are able to view lab/test results, encounter notes, upcoming appointments, etc.  Non-urgent messages can be sent to your provider as well.   To learn more about what you can do with MyChart, go to [email or web address].    Your next appointment:  1 year  Call in July to schedule Nov appointment    Provider:  Dr.Jordan

## 2023-03-05 ENCOUNTER — Other Ambulatory Visit: Payer: Self-pay | Admitting: Cardiology

## 2023-03-05 ENCOUNTER — Other Ambulatory Visit: Payer: Self-pay | Admitting: Family Medicine

## 2023-03-05 DIAGNOSIS — E1121 Type 2 diabetes mellitus with diabetic nephropathy: Secondary | ICD-10-CM

## 2023-04-10 ENCOUNTER — Ambulatory Visit: Payer: PPO | Admitting: Family Medicine

## 2023-04-21 ENCOUNTER — Other Ambulatory Visit: Payer: Self-pay | Admitting: Family Medicine

## 2023-05-13 ENCOUNTER — Other Ambulatory Visit: Payer: Self-pay | Admitting: Family Medicine

## 2023-05-13 DIAGNOSIS — E1121 Type 2 diabetes mellitus with diabetic nephropathy: Secondary | ICD-10-CM

## 2023-05-13 DIAGNOSIS — E785 Hyperlipidemia, unspecified: Secondary | ICD-10-CM

## 2023-05-14 DIAGNOSIS — R2689 Other abnormalities of gait and mobility: Secondary | ICD-10-CM | POA: Diagnosis not present

## 2023-05-14 DIAGNOSIS — R531 Weakness: Secondary | ICD-10-CM | POA: Diagnosis not present

## 2023-05-14 DIAGNOSIS — Z9181 History of falling: Secondary | ICD-10-CM | POA: Diagnosis not present

## 2023-05-16 DIAGNOSIS — Z9181 History of falling: Secondary | ICD-10-CM | POA: Diagnosis not present

## 2023-05-16 DIAGNOSIS — R2689 Other abnormalities of gait and mobility: Secondary | ICD-10-CM | POA: Diagnosis not present

## 2023-05-16 DIAGNOSIS — R531 Weakness: Secondary | ICD-10-CM | POA: Diagnosis not present

## 2023-05-21 ENCOUNTER — Ambulatory Visit: Payer: Self-pay | Admitting: Family Medicine

## 2023-05-21 ENCOUNTER — Telehealth: Payer: Self-pay | Admitting: Family Medicine

## 2023-05-21 NOTE — Telephone Encounter (Signed)
 Copied from CRM [id number]. Topic: Clinical - Red Word Triage >> May 21, 2023  8:24 AM Miguel Vazquez wrote: Red Word that prompted transfer to Nurse Triage:   Patient has bleeding when urinating. Started this morning.  Has had in the past No kidney stones or past UTIs   Chief Complaint: Blood in urine Frequency: Ongoing since yesterday (occurs each time patient urinates) Pertinent Negatives: Patient denies fever, flank pain Disposition: [] ED /[] Urgent Care (no appt availability in office) / [x] Appointment(In office/virtual)/ []  [facility]/ [] Home Care/ [] Refused Recommended Disposition /[] [facility]/ []  Follow-up with PCP Additional Notes: Patient stated that he is having blood in urine since yesterday. He denies pain with urination or any other symptoms. This RN offered to schedule appt for this morning and patient declined. He requested appointment for tomorrow instead. Appointment scheduled.  Reason for Disposition  Blood in urine  (Exception: Could be normal menstrual bleeding.)  Answer Assessment - Initial Assessment Questions 1. COLOR of URINE: "Describe the color of the urine."  (e.g., tea-colored, pink, red, bloody) "Do you have blood clots in your urine?" (e.g., none, pea, grape, small coin)     Ranges from light-dark red blood with dark spots  2. ONSET: "When did the bleeding start?"      A month ago it happened a couple times, and started again yesterday  3. EPISODES: "How many times has there been blood in the urine?" or "How many times today?"     One time so far today, yesterday there was blood in the urine every time he went to bathroom  4. PAIN with URINATION: "Is there any pain with passing your urine?" If Yes, ask: "How bad is the pain?"  (Scale 1-10; or mild, moderate, severe)    - MILD: Complains slightly about urination hurting.    - MODERATE: Interferes with normal activities.      - SEVERE: Excruciating, unwilling or unable to urinate because  of the pain.      No  5. FEVER: "Do you have a fever?" If Yes, ask: "What is your temperature, how was it measured, and when did it start?"     No  6. ASSOCIATED SYMPTOMS: "Are you passing urine more frequently than usual?"     No  7. OTHER SYMPTOMS: "Do you have any other symptoms?" (e.g., back/flank pain, abdomen pain, vomiting)     No  Protocols used: Urine - Blood In-A-AH

## 2023-05-21 NOTE — Telephone Encounter (Signed)
 Called pt and left vm to r/s appt due to weather.

## 2023-05-21 NOTE — Telephone Encounter (Signed)
 Patient has an appointment 2/19

## 2023-05-22 ENCOUNTER — Ambulatory Visit: Payer: PPO | Admitting: Family Medicine

## 2023-05-27 ENCOUNTER — Encounter: Payer: Self-pay | Admitting: Family Medicine

## 2023-05-27 ENCOUNTER — Ambulatory Visit (INDEPENDENT_AMBULATORY_CARE_PROVIDER_SITE_OTHER): Payer: PPO | Admitting: Family Medicine

## 2023-05-27 VITALS — BP 136/72 | HR 69 | Temp 97.3°F | Wt 142.6 lb

## 2023-05-27 DIAGNOSIS — E039 Hypothyroidism, unspecified: Secondary | ICD-10-CM | POA: Diagnosis not present

## 2023-05-27 DIAGNOSIS — E1121 Type 2 diabetes mellitus with diabetic nephropathy: Secondary | ICD-10-CM | POA: Diagnosis not present

## 2023-05-27 DIAGNOSIS — R300 Dysuria: Secondary | ICD-10-CM | POA: Diagnosis not present

## 2023-05-27 DIAGNOSIS — R319 Hematuria, unspecified: Secondary | ICD-10-CM | POA: Diagnosis not present

## 2023-05-27 DIAGNOSIS — E7800 Pure hypercholesterolemia, unspecified: Secondary | ICD-10-CM

## 2023-05-27 LAB — POC URINALSYSI DIPSTICK (AUTOMATED)
Bilirubin, UA: NEGATIVE
Blood, UA: POSITIVE
Glucose, UA: NEGATIVE
Ketones, UA: NEGATIVE
Nitrite, UA: NEGATIVE
Protein, UA: POSITIVE — AB
Spec Grav, UA: 1.02 (ref 1.010–1.025)
Urobilinogen, UA: 0.2 U/dL
pH, UA: 6 (ref 5.0–8.0)

## 2023-05-27 NOTE — Progress Notes (Signed)
 Established Patient Office Visit  Subjective   Patient ID: Miguel Vazquez, male    DOB: May 27, 1940  Age: 83 y.o. MRN: [id number]  Chief Complaint  Patient presents with   Hematuria    HPI   Miguel Vazquez is here today accompanied by wife.  Chief complaint is 1 month history of intermittent bright red blood per urine.  This has been painless hematuria.  No burning with urination.  First episode occurred about a month ago and he had 2 to 3 days of some bloody urine and then no recurrence until this past weekend.  No other bleeding concerns.  Denies any past history of gross hematuria.  He does have reported history of kidney stones but no recent pain.  No dysuria.  No fevers or chills.  No flank pain.  Quit smoking 40 years ago with about 20-pack-year history.  He states he has good appetite but his weight is down about 5 pounds from last visit here  Other medical problems include history of CAD, hypertension, hypothyroidism, type 2 diabetes, past history of depression Medications reviewed.  Compliant with all.  Due for follow-up labs.  Past Medical History:  Diagnosis Date   Acute blood loss anemia    Arthritis    Brain aneurysm 2018   followed by Miguel Vazquez   Cataract    Chronic kidney disease    kidney stone- 40 years ago   Coronary artery disease    Cyst of pancreas 07/23/2019   Diabetes mellitus    Diverticulitis    Gait abnormality 01/11/2021   GERD (gastroesophageal reflux disease)    History of kidney stones    Hx of abdominal aortic aneurysm 1998   Hyperlipidemia    Hypertension    Lower GI bleed    Memory loss    Myocardial infarction (HCC)    2016   Personal history of colonic polyps - adenomas 09/09/2013   Past Surgical History:  Procedure Laterality Date   ABDOMINAL AORTIC ANEURYSM REPAIR  1998   CARDIAC CATHETERIZATION N/A 03/18/2015   Procedure: Left Heart Cath and Coronary Angiography;  Surgeon: Miguel M [place], MD;  Location: [facility] INVASIVE CV LAB;  Service:  Cardiovascular;  Laterality: N/A;   CATARACT EXTRACTION Right    COLONOSCOPY     CORONARY ARTERY BYPASS GRAFT N/A 03/18/2015   Procedure: CORONARY ARTERY BYPASS GRAFTING (CABG) x  three, using left internal mammary artery and right leg greater saphenous vein harvested endoscopically;  Surgeon: Miguel Slot, MD;  Location: [facility] OR;  Service: Open Heart Surgery;  Laterality: N/A;   INGUINAL HERNIA REPAIR Bilateral    INTRAOPERATIVE TRANSESOPHAGEAL ECHOCARDIOGRAM N/A 03/18/2015   Procedure: INTRAOPERATIVE TRANSESOPHAGEAL ECHOCARDIOGRAM;  Surgeon: Miguel Slot, MD;  Location: [facility] OR;  Service: Open Heart Surgery;  Laterality: N/A;   REVERSE SHOULDER ARTHROPLASTY Left 09/21/2021   Procedure: REVERSE SHOULDER ARTHROPLASTY;  Surgeon: Miguel Hanly, MD;  Location: [facility] ORS;  Service: Orthopedics;  Laterality: Left;    UMBILICAL HERNIA REPAIR  1965    reports that he quit smoking about 36 years ago. His smoking use included cigarettes. He started smoking about 56 years ago. He has a 30 pack-year smoking history. He has never used smokeless tobacco. He reports current alcohol use. He reports that he does not use drugs. family history includes Bone cancer in his mother; Diabetes in his paternal aunt; Down syndrome in his son; Stroke in his son. Allergies  Allergen Reactions   Food Anaphylaxis    TREE  NUTS   Shellfish Allergy Anaphylaxis   Prednisone     "psychosis"   Tramadol Other (See Comments)    hallucinations   Venomil Honey Bee Venom [Honey Bee Venom] Swelling    Localized swelling    Review of Systems  Constitutional:  Negative for chills, fever and malaise/fatigue.  Eyes:  Negative for blurred vision.  Respiratory:  Negative for shortness of breath.   Cardiovascular:  Negative for chest pain.  Gastrointestinal:  Negative for abdominal pain and blood in stool.  Genitourinary:  Positive for hematuria. Negative for dysuria, flank pain and frequency.  Neurological:   Negative for dizziness, weakness and headaches.      Objective:     BP 136/72 (BP Location: Left Arm, Patient Position: Sitting, Cuff Size: Normal)   Pulse 69   Temp (!) 97.3 F (36.3 C) (Oral)   Wt 142 lb 9.6 oz (64.7 kg)   SpO2 96%   BMI 22.33 kg/m  BP Readings from Last 3 Encounters:  05/27/23 136/72  02/26/23 124/70  01/11/23 118/62   Wt Readings from Last 3 Encounters:  05/27/23 142 lb 9.6 oz (64.7 kg)  02/26/23 144 lb (65.3 kg)  01/11/23 148 lb 14.4 oz (67.5 kg)      Physical Exam Vitals reviewed.  Constitutional:      Appearance: He is well-developed.  Eyes:     Pupils: Pupils are equal, round, and reactive to light.  Neck:     Thyroid: No thyromegaly.  Cardiovascular:     Rate and Rhythm: Normal rate and regular rhythm.  Pulmonary:     Effort: Pulmonary effort is normal. No respiratory distress.     Breath sounds: Normal breath sounds. No wheezing or rales.  Musculoskeletal:     Cervical back: Neck supple.     Right lower leg: No edema.     Left lower leg: No edema.  Neurological:     General: No focal deficit present.     Mental Status: He is alert.      Results for orders placed or performed in visit on 05/27/23  POCT Urinalysis Dipstick (Automated)  Result Value Ref Range   Color, UA Yellow    Clarity, UA Clear    Glucose, UA Negative Negative   Bilirubin, UA Negative    Ketones, UA Negative    Spec Grav, UA 1.020 1.010 - 1.025   Blood, UA Positive    pH, UA 6.0 5.0 - 8.0   Protein, UA Positive (A) Negative   Urobilinogen, UA 0.2 0.2 or 1.0 E.U./dL   Nitrite, UA Negative    Leukocytes, UA Trace (A) Negative      The ASCVD Risk score (Arnett DK, et al., 2019) failed to calculate for the following reasons:   The 2019 ASCVD risk score is only valid for ages 24 to 56   Risk score cannot be calculated because patient has a medical history suggesting prior/existing ASCVD    Assessment & Plan:   #1 painless hematuria.  He has had about  3 separate episodes over the past month.  Urine dipstick today does confirm blood and trace leukocytes with negative nitrites.  -Urine culture and reflex microscopy sent -If microscopy confirms greater than 3 red blood cells per high-power field set up urology referral  #2 hypothyroidism.  Recheck TSH  #3 type 2 diabetes.  History of good control.  Last A1c 6.0%.  Recheck A1c today  #4 hyperlipidemia.  Past history of CABG.  Patient on high-dose statin with  atorvastatin 80 mg daily.  Recheck lipid and CMP  #5 hypertension stable on carvedilol and losartan   No follow-ups on file.    Evelena Peat, MD

## 2023-05-28 LAB — COMPREHENSIVE METABOLIC PANEL
ALT: 16 U/L (ref 0–53)
AST: 19 U/L (ref 0–37)
Albumin: 4.1 g/dL (ref 3.5–5.2)
Alkaline Phosphatase: 70 U/L (ref 39–117)
BUN: 30 mg/dL — ABNORMAL HIGH (ref 6–23)
CO2: 27 meq/L (ref 19–32)
Calcium: 9.7 mg/dL (ref 8.4–10.5)
Chloride: 102 meq/L (ref 96–112)
Creatinine, Ser: 1.2 mg/dL (ref 0.40–1.50)
GFR: 56.78 mL/min — ABNORMAL LOW (ref >60.00)
Glucose, Bld: 141 mg/dL — ABNORMAL HIGH (ref 70–99)
Potassium: 4.4 meq/L (ref 3.5–5.1)
Sodium: 139 meq/L (ref 135–145)
Total Bilirubin: 0.7 mg/dL (ref 0.2–1.2)
Total Protein: 6.9 g/dL (ref 6.0–8.3)

## 2023-05-28 LAB — URINALYSIS, MICROSCOPIC ONLY

## 2023-05-28 LAB — CBC WITH DIFFERENTIAL/PLATELET
Basophils Absolute: 0.1 10*3/uL (ref 0.0–0.1)
Basophils Relative: 1.3 % (ref 0.0–3.0)
Eosinophils Absolute: 0.6 10*3/uL (ref 0.0–0.7)
Eosinophils Relative: 6.9 % — ABNORMAL HIGH (ref 0.0–5.0)
HCT: 38.9 % — ABNORMAL LOW (ref 39.0–52.0)
Hemoglobin: 13 g/dL (ref 13.0–17.0)
Lymphocytes Relative: 12.7 % (ref 12.0–46.0)
Lymphs Abs: 1.1 10*3/uL (ref 0.7–4.0)
MCHC: 33.5 g/dL (ref 30.0–36.0)
MCV: 99.6 fl (ref 78.0–100.0)
Monocytes Absolute: 0.6 10*3/uL (ref 0.1–1.0)
Monocytes Relative: 6.8 % (ref 3.0–12.0)
Neutro Abs: 6.5 10*3/uL (ref 1.4–7.7)
Neutrophils Relative %: 72.3 % (ref 43.0–77.0)
Platelets: 167 10*3/uL (ref 150.0–400.0)
RBC: 3.9 Mil/uL — ABNORMAL LOW (ref 4.22–5.81)
RDW: 14.1 % (ref 11.5–15.5)
WBC: 8.9 10*3/uL (ref 4.0–10.5)

## 2023-05-28 LAB — HEMOGLOBIN A1C: Hgb A1c MFr Bld: 6.3 % (ref 4.6–6.5)

## 2023-05-28 LAB — URINE CULTURE
MICRO NUMBER:: 16120028
Result:: NO GROWTH
SPECIMEN QUALITY:: ADEQUATE

## 2023-05-28 LAB — LIPID PANEL
Cholesterol: 112 mg/dL (ref 0–200)
HDL: 44.8 mg/dL (ref >39.00)
LDL Cholesterol: 46 mg/dL (ref 0–99)
NonHDL: 67.17
Total CHOL/HDL Ratio: 2
Triglycerides: 107 mg/dL (ref 0.0–149.0)
VLDL: 21.4 mg/dL (ref 0.0–40.0)

## 2023-05-28 LAB — TSH: TSH: 2.48 u[IU]/mL (ref 0.35–5.50)

## 2023-05-29 NOTE — Addendum Note (Signed)
 Addended by: Christy Sartorius on: 05/29/2023 08:06 AM   Modules accepted: Orders

## 2023-05-30 DIAGNOSIS — R2689 Other abnormalities of gait and mobility: Secondary | ICD-10-CM | POA: Diagnosis not present

## 2023-05-30 DIAGNOSIS — Z9181 History of falling: Secondary | ICD-10-CM | POA: Diagnosis not present

## 2023-05-30 DIAGNOSIS — R531 Weakness: Secondary | ICD-10-CM | POA: Diagnosis not present

## 2023-05-31 ENCOUNTER — Telehealth: Payer: Self-pay

## 2023-05-31 NOTE — Telephone Encounter (Signed)
 Copied from CRM [id number]. Topic: Clinical - Lab/Test Results >> May 31, 2023 12:38 PM Rodman Pickle T wrote: Reason for CRM: patient wife would like a call back to go over results there computer is down and there u able to look at the results

## 2023-05-31 NOTE — Telephone Encounter (Signed)
 Patient informed of the results and voiced understanding

## 2023-06-05 DIAGNOSIS — R531 Weakness: Secondary | ICD-10-CM | POA: Diagnosis not present

## 2023-06-05 DIAGNOSIS — Z9181 History of falling: Secondary | ICD-10-CM | POA: Diagnosis not present

## 2023-06-05 DIAGNOSIS — R2689 Other abnormalities of gait and mobility: Secondary | ICD-10-CM | POA: Diagnosis not present

## 2023-06-19 ENCOUNTER — Telehealth: Payer: Self-pay | Admitting: Family Medicine

## 2023-06-19 ENCOUNTER — Other Ambulatory Visit: Payer: Self-pay | Admitting: Cardiology

## 2023-06-19 NOTE — Telephone Encounter (Signed)
 Copied from CRM [id number]. Topic: Clinical - Medication Question >> Jun 19, 2023  9:53 AM Theodis Sato wrote: Reason for CRM: Patients daughter is requesting a phone call back from Dr.[name] or nurse regarding raising the dosage on the patients escitalopram (LEXAPRO) 10 MG tablet

## 2023-06-19 NOTE — Telephone Encounter (Signed)
 I spoke with the patient's daughter Silva Bandy and she reported the patient's depression has worsened over the past 2 weeks. Silva Bandy reported the patient has had no appetite and has been saying "he does not care if he dies". Kristi inquired of his Lexapro can be increased to 20 mg to see if this will improve his depression.

## 2023-06-19 NOTE — Telephone Encounter (Signed)
Patient's daughter informed of the message below and voiced understanding.

## 2023-06-26 DIAGNOSIS — M4316 Spondylolisthesis, lumbar region: Secondary | ICD-10-CM | POA: Diagnosis not present

## 2023-06-26 DIAGNOSIS — M539 Dorsopathy, unspecified: Secondary | ICD-10-CM | POA: Diagnosis not present

## 2023-06-26 DIAGNOSIS — S32040A Wedge compression fracture of fourth lumbar vertebra, initial encounter for closed fracture: Secondary | ICD-10-CM | POA: Diagnosis not present

## 2023-06-26 DIAGNOSIS — M545 Low back pain, unspecified: Secondary | ICD-10-CM | POA: Diagnosis not present

## 2023-07-12 ENCOUNTER — Other Ambulatory Visit: Payer: Self-pay | Admitting: Family Medicine

## 2023-07-12 DIAGNOSIS — E785 Hyperlipidemia, unspecified: Secondary | ICD-10-CM

## 2023-07-30 ENCOUNTER — Other Ambulatory Visit: Payer: Self-pay | Admitting: Family Medicine

## 2023-07-30 NOTE — Telephone Encounter (Signed)
 Needs appt

## 2023-07-30 NOTE — Telephone Encounter (Signed)
 Copied from CRM [id number]. Topic: Clinical - Medication Refill >> Jul 30, 2023  8:29 AM Agatha Alcon wrote: Most Recent Primary Care Visit:  Provider: Marquetta Sit  Department: [facility]  Visit Type: ACUTE  Date: 05/27/2023  Medication: escitalopram  (LEXAPRO ) 10 MG tablet  Has the patient contacted their pharmacy? No, RX refill request initiated through providers office (Agent: If no, request that the patient contact the pharmacy for the refill. If patient does not wish to contact the pharmacy document the reason why and proceed with request.) (Agent: If yes, when and what did the pharmacy advise?)  Is this the correct pharmacy for this prescription? Yes If no, delete pharmacy and type the correct one.  This is the patient's preferred pharmacy:  [id number] - [place], [place] - [address] [place] [address] Phone: [phone] Fax: [phone]   Has the prescription been filled recently? Yes  Is the patient out of the medication? No, patient has 3 days of pills left  Has the patient been seen for an appointment in the last year OR does the patient have an upcoming appointment? Yes  Can we respond through MyChart? No  Agent: Please be advised that Rx refills may take up to 3 business days. We ask that you follow-up with your pharmacy.

## 2023-08-02 ENCOUNTER — Telehealth: Payer: Self-pay

## 2023-08-02 MED ORDER — ESCITALOPRAM OXALATE 10 MG PO TABS: 10.0000 mg | ORAL_TABLET | Freq: Every day | ORAL | 0 refills | Status: DC

## 2023-08-02 NOTE — Addendum Note (Signed)
 Addended by: Aurelio Leer on: 08/02/2023 02:09 PM   Modules accepted: Orders

## 2023-08-02 NOTE — Telephone Encounter (Signed)
 Rx sent.

## 2023-08-02 NOTE — Telephone Encounter (Signed)
 Copied from CRM [id number]. Topic: Clinical - Prescription Issue >> Aug 02, 2023  1:19 PM Kita Perish H wrote: Reason for CRM: Patients daughter calling to check status of refill request for escitalopram  (LEXAPRO ) 10 MG tablet, advised patient needed an appointment. Scheduled for 5/6 at 2:15 pm with Dr. Darren Em, but patient is out of medication and [facility]

## 2023-08-06 ENCOUNTER — Other Ambulatory Visit: Payer: Self-pay | Admitting: Family Medicine

## 2023-08-06 ENCOUNTER — Encounter: Payer: Self-pay | Admitting: Family Medicine

## 2023-08-06 ENCOUNTER — Ambulatory Visit (INDEPENDENT_AMBULATORY_CARE_PROVIDER_SITE_OTHER): Admitting: Family Medicine

## 2023-08-06 VITALS — BP 132/74 | HR 63 | Temp 97.3°F | Ht 67.0 in | Wt 135.7 lb

## 2023-08-06 DIAGNOSIS — E1121 Type 2 diabetes mellitus with diabetic nephropathy: Secondary | ICD-10-CM | POA: Diagnosis not present

## 2023-08-06 DIAGNOSIS — R4189 Other symptoms and signs involving cognitive functions and awareness: Secondary | ICD-10-CM

## 2023-08-06 DIAGNOSIS — Z7984 Long term (current) use of oral hypoglycemic drugs: Secondary | ICD-10-CM | POA: Diagnosis not present

## 2023-08-06 DIAGNOSIS — R296 Repeated falls: Secondary | ICD-10-CM | POA: Diagnosis not present

## 2023-08-06 DIAGNOSIS — E039 Hypothyroidism, unspecified: Secondary | ICD-10-CM

## 2023-08-06 MED ORDER — METFORMIN HCL 1000 MG PO TABS
1000.0000 mg | ORAL_TABLET | Freq: Two times a day (BID) | ORAL | 3 refills | Status: DC
Start: 2023-08-06 — End: 2023-12-05

## 2023-08-06 MED ORDER — DONEPEZIL HCL 10 MG PO TABS: 10.0000 mg | ORAL_TABLET | Freq: Every day | ORAL | 3 refills | Status: DC

## 2023-08-06 MED ORDER — LEVOTHYROXINE SODIUM 75 MCG PO TABS: 75.0000 ug | ORAL_TABLET | Freq: Every day | ORAL | 3 refills | Status: DC

## 2023-08-06 NOTE — Progress Notes (Signed)
 Established Patient Office Visit  Subjective   Patient ID: Miguel Vazquez, male    DOB: 1940/10/25  Age: 83 y.o. MRN: [id number]  Chief Complaint  Patient presents with   Medication Refill    HPI   Mr. Spagnolo has multiple chronic problems including cognitive impairment, ischemic cardiomyopathy, hypertension, hypothyroidism, type 2 diabetes, recurrent depression.  History of CABG 2016.  Seen today requesting referral to PT.  He has had frequent falls including one about a month ago.  Had some back pain and it sounds like he may have seen orthopedist for this.  He is very vague with regard to details.  Denies any recent head injury.  Has gone to [facility] physical therapy multiple times in the past and requesting refill.  Does folic he has gotten weaker in his legs.  Ambulates with a cane.  Had some recent microscopic hematuria by urinalysis here.  We referred to urologist and not clear if they went.  He denies any further gross hematuria.  No burning with urination.  Medications reviewed.  They have a daughter who is a nurse who has been overlooking his medications.  He apparently needs refills of several medications today including levothyroxine , metformin , and Aricept .  He had recent labs in February.  A1c 6.3%.  Thyroid  normal range.  Kidney function and electrolytes stable.  Hemoglobin normal.  Past Medical History:  Diagnosis Date   Acute blood loss anemia    Arthritis    Brain aneurysm 2018   followed by Dr Christiane Cowing   Cataract    Chronic kidney disease    kidney stone- 40 years ago   Coronary artery disease    Cyst of pancreas 07/23/2019   Diabetes mellitus    Diverticulitis    Gait abnormality 01/11/2021   GERD (gastroesophageal reflux disease)    History of kidney stones    Hx of abdominal aortic aneurysm 1998   Hyperlipidemia    Hypertension    Lower GI bleed    Memory loss    Myocardial infarction (HCC)    2016   Personal history of colonic polyps - adenomas 09/09/2013    Past Surgical History:  Procedure Laterality Date   ABDOMINAL AORTIC ANEURYSM REPAIR  1998   CARDIAC CATHETERIZATION N/A 03/18/2015   Procedure: Left Heart Cath and Coronary Angiography;  Surgeon: Peter M Swaziland, MD;  Location: [facility] INVASIVE CV LAB;  Service: Cardiovascular;  Laterality: N/A;   CATARACT EXTRACTION Right    COLONOSCOPY     CORONARY ARTERY BYPASS GRAFT N/A 03/18/2015   Procedure: CORONARY ARTERY BYPASS GRAFTING (CABG) x  three, using left internal mammary artery and right leg greater saphenous vein harvested endoscopically;  Surgeon: Zelphia Higashi, MD;  Location: [facility] OR;  Service: Open Heart Surgery;  Laterality: N/A;   INGUINAL HERNIA REPAIR Bilateral    INTRAOPERATIVE TRANSESOPHAGEAL ECHOCARDIOGRAM N/A 03/18/2015   Procedure: INTRAOPERATIVE TRANSESOPHAGEAL ECHOCARDIOGRAM;  Surgeon: Zelphia Higashi, MD;  Location: [facility] OR;  Service: Open Heart Surgery;  Laterality: N/A;   REVERSE SHOULDER ARTHROPLASTY Left 09/21/2021   Procedure: REVERSE SHOULDER ARTHROPLASTY;  Surgeon: Ellard Gunning, MD;  Location: [facility] ORS;  Service: Orthopedics;  Laterality: Left;    UMBILICAL HERNIA REPAIR  1965    reports that he quit smoking about 36 years ago. His smoking use included cigarettes. He started smoking about 56 years ago. He has a 30 pack-year smoking history. He has never used smokeless tobacco. He reports current alcohol use. He reports that he does  not use drugs. family history includes Bone cancer in his mother; Diabetes in his paternal aunt; Down syndrome in his son; Stroke in his son. Allergies  Allergen Reactions   Food Anaphylaxis    TREE NUTS   Shellfish Allergy Anaphylaxis   Prednisone      "psychosis"   Tramadol  Other (See Comments)    hallucinations   Venomil Honey Bee Venom [Honey Bee Venom] Swelling    Localized swelling    Review of Systems  Constitutional:  Negative for chills and fever.  Eyes:  Negative for blurred vision.  Respiratory:  Negative for  shortness of breath.   Cardiovascular:  Negative for chest pain.  Gastrointestinal:  Negative for abdominal pain.  Genitourinary:  Negative for dysuria.  Neurological:  Negative for dizziness, weakness and headaches.      Objective:     BP 132/74 (BP Location: Left Arm, Patient Position: Sitting, Cuff Size: Normal)   Pulse 63   Temp (!) 97.3 F (36.3 C) (Oral)   Ht 5\' 7"  (1.702 m)   Wt 135 lb 11.2 oz (61.6 kg)   SpO2 97%   BMI 21.25 kg/m  BP Readings from Last 3 Encounters:  08/06/23 132/74  05/27/23 136/72  02/26/23 124/70   Wt Readings from Last 3 Encounters:  08/06/23 135 lb 11.2 oz (61.6 kg)  05/27/23 142 lb 9.6 oz (64.7 kg)  02/26/23 144 lb (65.3 kg)      Physical Exam Vitals reviewed.  Constitutional:      General: He is not in acute distress.    Appearance: He is well-developed. He is not ill-appearing.  Eyes:     Pupils: Pupils are equal, round, and reactive to light.  Neck:     Thyroid : No thyromegaly.  Cardiovascular:     Rate and Rhythm: Normal rate and regular rhythm.     Heart sounds: Murmur heard.  Pulmonary:     Effort: Pulmonary effort is normal. No respiratory distress.     Breath sounds: Normal breath sounds. No wheezing or rales.  Musculoskeletal:     Cervical back: Neck supple.     Right lower leg: No edema.     Left lower leg: No edema.  Neurological:     Mental Status: He is alert and oriented to person, place, and time.      No results found for any visits on 08/06/23.  Last CBC Lab Results  Component Value Date   WBC 8.9 05/27/2023   HGB 13.0 05/27/2023   HCT 38.9 (L) 05/27/2023   MCV 99.6 05/27/2023   MCH 33.7 09/12/2021   RDW 14.1 05/27/2023   PLT 167.0 05/27/2023   Last metabolic panel Lab Results  Component Value Date   GLUCOSE 141 (H) 05/27/2023   NA 139 05/27/2023   K 4.4 05/27/2023   CL 102 05/27/2023   CO2 27 05/27/2023   BUN 30 (H) 05/27/2023   CREATININE 1.20 05/27/2023   GFR 56.78 (L) 05/27/2023    CALCIUM  9.7 05/27/2023   PHOS 3.1 09/03/2016   PROT 6.9 05/27/2023   ALBUMIN  4.1 05/27/2023   LABGLOB 2.8 01/11/2021   BILITOT 0.7 05/27/2023   ALKPHOS 70 05/27/2023   AST 19 05/27/2023   ALT 16 05/27/2023   ANIONGAP 9 09/12/2021   Last lipids Lab Results  Component Value Date   CHOL 112 05/27/2023   HDL 44.80 05/27/2023   LDLCALC 46 05/27/2023   LDLDIRECT 84.1 05/21/2013   TRIG 107.0 05/27/2023   CHOLHDL 2 05/27/2023  Last hemoglobin A1c Lab Results  Component Value Date   HGBA1C 6.3 05/27/2023   Last thyroid  functions Lab Results  Component Value Date   TSH 2.48 05/27/2023      The ASCVD Risk score (Arnett DK, et al., 2019) failed to calculate for the following reasons:   The 2019 ASCVD risk score is only valid for ages 38 to 37   Risk score cannot be calculated because patient has a medical history suggesting prior/existing ASCVD    Assessment & Plan:   #1 frequent falls and generalized weakness.  Timed get up and go test today was 23 seconds.  Very weak generally in his lower extremities.  Was able to get up from chair from seated but had to struggle a bit to do so.  Set up outpatient physical therapy for further strengthening and fall prevention  #2 cognitive impairment.  Patient on Aricept .  Refill Aricept  10 mg daily  #3 hypothyroidism.  Recent TSH at goal in February.  Refill levothyroxine  for 1 year  #4 type 2 diabetes.  Recent A1c stable at 6.3%.  Refilled metformin  for 1 year.   Return in about 3 months (around 11/06/2023).    Glean Lamy, MD

## 2023-08-20 DIAGNOSIS — K6289 Other specified diseases of anus and rectum: Secondary | ICD-10-CM | POA: Diagnosis not present

## 2023-08-20 DIAGNOSIS — R310 Gross hematuria: Secondary | ICD-10-CM | POA: Diagnosis not present

## 2023-08-20 DIAGNOSIS — N400 Enlarged prostate without lower urinary tract symptoms: Secondary | ICD-10-CM | POA: Diagnosis not present

## 2023-08-20 DIAGNOSIS — R319 Hematuria, unspecified: Secondary | ICD-10-CM | POA: Diagnosis not present

## 2023-08-20 DIAGNOSIS — Z1330 Encounter for screening examination for mental health and behavioral disorders, unspecified: Secondary | ICD-10-CM | POA: Diagnosis not present

## 2023-08-23 DIAGNOSIS — K629 Disease of anus and rectum, unspecified: Secondary | ICD-10-CM | POA: Diagnosis not present

## 2023-09-17 DIAGNOSIS — R310 Gross hematuria: Secondary | ICD-10-CM | POA: Diagnosis not present

## 2023-09-23 DIAGNOSIS — E039 Hypothyroidism, unspecified: Secondary | ICD-10-CM | POA: Diagnosis not present

## 2023-09-23 DIAGNOSIS — N400 Enlarged prostate without lower urinary tract symptoms: Secondary | ICD-10-CM | POA: Diagnosis not present

## 2023-09-23 DIAGNOSIS — C673 Malignant neoplasm of anterior wall of bladder: Secondary | ICD-10-CM | POA: Diagnosis not present

## 2023-09-23 DIAGNOSIS — D494 Neoplasm of unspecified behavior of bladder: Secondary | ICD-10-CM | POA: Diagnosis not present

## 2023-09-23 DIAGNOSIS — I2510 Atherosclerotic heart disease of native coronary artery without angina pectoris: Secondary | ICD-10-CM | POA: Diagnosis not present

## 2023-09-23 DIAGNOSIS — I4891 Unspecified atrial fibrillation: Secondary | ICD-10-CM | POA: Diagnosis not present

## 2023-09-23 DIAGNOSIS — I10 Essential (primary) hypertension: Secondary | ICD-10-CM | POA: Diagnosis not present

## 2023-09-23 DIAGNOSIS — E119 Type 2 diabetes mellitus without complications: Secondary | ICD-10-CM | POA: Diagnosis not present

## 2023-09-23 DIAGNOSIS — Z951 Presence of aortocoronary bypass graft: Secondary | ICD-10-CM | POA: Diagnosis not present

## 2023-09-25 DIAGNOSIS — D090 Carcinoma in situ of bladder: Secondary | ICD-10-CM | POA: Diagnosis not present

## 2023-10-11 DIAGNOSIS — N400 Enlarged prostate without lower urinary tract symptoms: Secondary | ICD-10-CM | POA: Diagnosis not present

## 2023-10-17 DIAGNOSIS — G3184 Mild cognitive impairment, so stated: Secondary | ICD-10-CM | POA: Diagnosis not present

## 2023-10-17 DIAGNOSIS — R8290 Unspecified abnormal findings in urine: Secondary | ICD-10-CM | POA: Diagnosis not present

## 2023-10-17 DIAGNOSIS — I10 Essential (primary) hypertension: Secondary | ICD-10-CM | POA: Diagnosis not present

## 2023-10-17 DIAGNOSIS — Z9889 Other specified postprocedural states: Secondary | ICD-10-CM | POA: Diagnosis not present

## 2023-10-17 DIAGNOSIS — I25810 Atherosclerosis of coronary artery bypass graft(s) without angina pectoris: Secondary | ICD-10-CM | POA: Diagnosis not present

## 2023-10-17 DIAGNOSIS — E039 Hypothyroidism, unspecified: Secondary | ICD-10-CM | POA: Diagnosis not present

## 2023-10-17 DIAGNOSIS — E1121 Type 2 diabetes mellitus with diabetic nephropathy: Secondary | ICD-10-CM | POA: Diagnosis not present

## 2023-10-17 DIAGNOSIS — R54 Age-related physical debility: Secondary | ICD-10-CM | POA: Diagnosis not present

## 2023-10-17 DIAGNOSIS — I350 Nonrheumatic aortic (valve) stenosis: Secondary | ICD-10-CM | POA: Diagnosis not present

## 2023-10-17 DIAGNOSIS — F321 Major depressive disorder, single episode, moderate: Secondary | ICD-10-CM | POA: Diagnosis not present

## 2023-10-17 DIAGNOSIS — Z01818 Encounter for other preprocedural examination: Secondary | ICD-10-CM | POA: Diagnosis not present

## 2023-10-17 DIAGNOSIS — Z951 Presence of aortocoronary bypass graft: Secondary | ICD-10-CM | POA: Diagnosis not present

## 2023-10-30 DIAGNOSIS — F32A Depression, unspecified: Secondary | ICD-10-CM | POA: Diagnosis not present

## 2023-10-30 DIAGNOSIS — C673 Malignant neoplasm of anterior wall of bladder: Secondary | ICD-10-CM | POA: Diagnosis not present

## 2023-10-30 DIAGNOSIS — I2510 Atherosclerotic heart disease of native coronary artery without angina pectoris: Secondary | ICD-10-CM | POA: Diagnosis not present

## 2023-10-30 DIAGNOSIS — E039 Hypothyroidism, unspecified: Secondary | ICD-10-CM | POA: Diagnosis not present

## 2023-10-30 DIAGNOSIS — I671 Cerebral aneurysm, nonruptured: Secondary | ICD-10-CM | POA: Diagnosis not present

## 2023-10-30 DIAGNOSIS — I350 Nonrheumatic aortic (valve) stenosis: Secondary | ICD-10-CM | POA: Diagnosis not present

## 2023-10-30 DIAGNOSIS — I110 Hypertensive heart disease with heart failure: Secondary | ICD-10-CM | POA: Diagnosis not present

## 2023-10-30 DIAGNOSIS — N3080 Other cystitis without hematuria: Secondary | ICD-10-CM | POA: Diagnosis not present

## 2023-10-30 DIAGNOSIS — I4891 Unspecified atrial fibrillation: Secondary | ICD-10-CM | POA: Diagnosis not present

## 2023-10-30 DIAGNOSIS — I255 Ischemic cardiomyopathy: Secondary | ICD-10-CM | POA: Diagnosis not present

## 2023-10-30 DIAGNOSIS — E119 Type 2 diabetes mellitus without complications: Secondary | ICD-10-CM | POA: Diagnosis not present

## 2023-10-30 DIAGNOSIS — Z951 Presence of aortocoronary bypass graft: Secondary | ICD-10-CM | POA: Diagnosis not present

## 2023-10-30 DIAGNOSIS — Z79899 Other long term (current) drug therapy: Secondary | ICD-10-CM | POA: Diagnosis not present

## 2023-10-30 DIAGNOSIS — D494 Neoplasm of unspecified behavior of bladder: Secondary | ICD-10-CM | POA: Diagnosis not present

## 2023-10-30 DIAGNOSIS — Z7984 Long term (current) use of oral hypoglycemic drugs: Secondary | ICD-10-CM | POA: Diagnosis not present

## 2023-10-30 DIAGNOSIS — Z79890 Hormone replacement therapy (postmenopausal): Secondary | ICD-10-CM | POA: Diagnosis not present

## 2023-10-30 DIAGNOSIS — C679 Malignant neoplasm of bladder, unspecified: Secondary | ICD-10-CM | POA: Diagnosis not present

## 2023-10-30 DIAGNOSIS — Z8679 Personal history of other diseases of the circulatory system: Secondary | ICD-10-CM | POA: Diagnosis not present

## 2023-10-30 DIAGNOSIS — N3289 Other specified disorders of bladder: Secondary | ICD-10-CM | POA: Diagnosis not present

## 2023-10-30 DIAGNOSIS — I5020 Unspecified systolic (congestive) heart failure: Secondary | ICD-10-CM | POA: Diagnosis not present

## 2023-10-30 DIAGNOSIS — Z8719 Personal history of other diseases of the digestive system: Secondary | ICD-10-CM | POA: Diagnosis not present

## 2023-10-30 DIAGNOSIS — Z7982 Long term (current) use of aspirin: Secondary | ICD-10-CM | POA: Diagnosis not present

## 2023-10-30 DIAGNOSIS — I252 Old myocardial infarction: Secondary | ICD-10-CM | POA: Diagnosis not present

## 2023-10-31 DIAGNOSIS — N3080 Other cystitis without hematuria: Secondary | ICD-10-CM | POA: Diagnosis not present

## 2023-11-03 ENCOUNTER — Other Ambulatory Visit: Payer: Self-pay | Admitting: Family Medicine

## 2023-11-03 DIAGNOSIS — E785 Hyperlipidemia, unspecified: Secondary | ICD-10-CM

## 2023-11-06 ENCOUNTER — Ambulatory Visit: Admitting: Family Medicine

## 2023-11-08 ENCOUNTER — Encounter: Payer: Self-pay | Admitting: Family Medicine

## 2023-11-08 ENCOUNTER — Ambulatory Visit (INDEPENDENT_AMBULATORY_CARE_PROVIDER_SITE_OTHER): Admitting: Family Medicine

## 2023-11-08 VITALS — BP 112/88 | HR 76 | Temp 97.6°F | Wt 136.5 lb

## 2023-11-08 DIAGNOSIS — C679 Malignant neoplasm of bladder, unspecified: Secondary | ICD-10-CM

## 2023-11-08 DIAGNOSIS — Z7984 Long term (current) use of oral hypoglycemic drugs: Secondary | ICD-10-CM

## 2023-11-08 DIAGNOSIS — E1159 Type 2 diabetes mellitus with other circulatory complications: Secondary | ICD-10-CM

## 2023-11-08 DIAGNOSIS — I152 Hypertension secondary to endocrine disorders: Secondary | ICD-10-CM | POA: Diagnosis not present

## 2023-11-08 DIAGNOSIS — R531 Weakness: Secondary | ICD-10-CM

## 2023-11-08 DIAGNOSIS — E1121 Type 2 diabetes mellitus with diabetic nephropathy: Secondary | ICD-10-CM

## 2023-11-08 LAB — POCT GLYCOSYLATED HEMOGLOBIN (HGB A1C): Hemoglobin A1C: 6.2 % — AB (ref 4.0–5.6)

## 2023-11-08 NOTE — Progress Notes (Signed)
 Established Patient Office Visit  Subjective   Patient ID: Miguel Vazquez, male    DOB: 1941-03-09  Age: 83 y.o. MRN: [id number]  No chief complaint on file.   HPI   Miguel Vazquez is seen today, accompanied by wife for ongoing medical follow-up.  He has history of ischemic cardiomyopathy, CAD, hypertension, hypothyroidism, type 2 diabetes, hyperlipidemia, mild cognitive impairment.  He had recent microscopic hematuria and was referred to urology and did end up having bladder cancer.  He had surgery July 30 and that went well.  No further gross hematuria.  He has type 2 diabetes treated with metformin .  A1c's have consistently been fairly well-controlled.  He has had some weight loss in the past year but looks like this is stable from last visit.  Appetite fair.  Generalized weakness and we have been very concerned about his risk for falls.  We had referred to outpatient physical therapy last visit but they never went because of his frequent follow-ups with urology.  They would like to consider rescheduling at this point.  They are asking for referral.  He denies any falls since last visit, but has had multiple falls in the past.  Past Medical History:  Diagnosis Date   Acute blood loss anemia    Arthritis    Brain aneurysm 2018   followed by Dr Jerri   Cataract    Chronic kidney disease    kidney stone- 40 years ago   Coronary artery disease    Cyst of pancreas 07/23/2019   Diabetes mellitus    Diverticulitis    Gait abnormality 01/11/2021   GERD (gastroesophageal reflux disease)    History of kidney stones    Hx of abdominal aortic aneurysm 1998   Hyperlipidemia    Hypertension    Lower GI bleed    Memory loss    Myocardial infarction (HCC)    2016   Personal history of colonic polyps - adenomas 09/09/2013   Past Surgical History:  Procedure Laterality Date   ABDOMINAL AORTIC ANEURYSM REPAIR  1998   CARDIAC CATHETERIZATION N/A 03/18/2015   Procedure: Left Heart Cath and  Coronary Angiography;  Surgeon: Peter M Swaziland, MD;  Location[facility] INVASIVE CV LAB;  Service: Cardiovascular;  Laterality: N/A;   CATARACT EXTRACTION Right    COLONOSCOPY     CORONARY ARTERY BYPASS GRAFT N/A 03/18/2015   Procedure: CORONARY ARTERY BYPASS GRAFTING (CABG) x  three, using left internal mammary artery and right leg greater saphenous vein harvested endoscopically;  Surgeon: Elspeth JAYSON Millers, MD;  Location: [facility] OR;  Service: Open Heart Surgery;  Laterality: N/A;   INGUINAL HERNIA REPAIR Bilateral    INTRAOPERATIVE TRANSESOPHAGEAL ECHOCARDIOGRAM N/A 03/18/2015   Procedure: INTRAOPERATIVE TRANSESOPHAGEAL ECHOCARDIOGRAM;  Surgeon: Elspeth JAYSON Millers, MD;  Location: [facility] OR;  Service: Open Heart Surgery;  Laterality: N/A;   REVERSE SHOULDER ARTHROPLASTY Left 09/21/2021   Procedure: REVERSE SHOULDER ARTHROPLASTY;  Surgeon: Melita Drivers, MD;  Location: [facility] ORS;  Service: Orthopedics;  Laterality: Left;    UMBILICAL HERNIA REPAIR  1965    reports that he quit smoking about 37 years ago. His smoking use included cigarettes. He started smoking about 57 years ago. He has a 30 pack-year smoking history. He has never used smokeless tobacco. He reports current alcohol use. He reports that he does not use drugs. family history includes Bone cancer in his mother; Diabetes in his paternal aunt; Down syndrome in his son; Stroke in his son. Allergies  Allergen Reactions  Food Anaphylaxis    TREE NUTS   Shellfish Allergy Anaphylaxis   Prednisone      psychosis   Tramadol  Other (See Comments)    hallucinations   Venomil Honey Bee Venom [Honey Bee Venom] Swelling    Localized swelling    Review of Systems  Constitutional:  Negative for malaise/fatigue.  Eyes:  Negative for blurred vision.  Respiratory:  Negative for shortness of breath.   Cardiovascular:  Negative for chest pain.  Gastrointestinal:  Negative for abdominal pain.  Neurological:  Negative for dizziness, weakness and  headaches.      Objective:     BP 112/88   Pulse 76   Temp 97.6 F (36.4 C) (Oral)   Wt 136 lb 8 oz (61.9 kg)   SpO2 99%   BMI 21.38 kg/m  BP Readings from Last 3 Encounters:  11/08/23 112/88  08/06/23 132/74  05/27/23 136/72   Wt Readings from Last 3 Encounters:  11/08/23 136 lb 8 oz (61.9 kg)  08/06/23 135 lb 11.2 oz (61.6 kg)  05/27/23 142 lb 9.6 oz (64.7 kg)      Physical Exam Vitals reviewed.  Constitutional:      General: He is not in acute distress.    Appearance: He is well-developed.  Eyes:     Pupils: Pupils are equal, round, and reactive to light.  Neck:     Thyroid : No thyromegaly.  Cardiovascular:     Rate and Rhythm: Normal rate and regular rhythm.  Pulmonary:     Effort: Pulmonary effort is normal. No respiratory distress.     Breath sounds: Normal breath sounds. No wheezing or rales.  Musculoskeletal:     Cervical back: Neck supple.     Right lower leg: No edema.     Left lower leg: No edema.  Neurological:     Mental Status: He is alert and oriented to person, place, and time.      Results for orders placed or performed in visit on 11/08/23  POC HgB A1c  Result Value Ref Range   Hemoglobin A1C 6.2 (A) 4.0 - 5.6 %   HbA1c POC (<> result, manual entry)     HbA1c, POC (prediabetic range)     HbA1c, POC (controlled diabetic range)      Last CBC Lab Results  Component Value Date   WBC 8.9 05/27/2023   HGB 13.0 05/27/2023   HCT 38.9 (L) 05/27/2023   MCV 99.6 05/27/2023   MCH 33.7 09/12/2021   RDW 14.1 05/27/2023   PLT 167.0 05/27/2023   Last metabolic panel Lab Results  Component Value Date   GLUCOSE 141 (H) 05/27/2023   NA 139 05/27/2023   K 4.4 05/27/2023   CL 102 05/27/2023   CO2 27 05/27/2023   BUN 30 (H) 05/27/2023   CREATININE 1.20 05/27/2023   GFR 56.78 (L) 05/27/2023   CALCIUM  9.7 05/27/2023   PHOS 3.1 09/03/2016   PROT 6.9 05/27/2023   ALBUMIN  4.1 05/27/2023   LABGLOB 2.8 01/11/2021   BILITOT 0.7 05/27/2023    ALKPHOS 70 05/27/2023   AST 19 05/27/2023   ALT 16 05/27/2023   ANIONGAP 9 09/12/2021   Last lipids Lab Results  Component Value Date   CHOL 112 05/27/2023   HDL 44.80 05/27/2023   LDLCALC 46 05/27/2023   LDLDIRECT 84.1 05/21/2013   TRIG 107.0 05/27/2023   CHOLHDL 2 05/27/2023   Last hemoglobin A1c Lab Results  Component Value Date   HGBA1C 6.2 (A) 11/08/2023  Last thyroid  functions Lab Results  Component Value Date   TSH 2.48 05/27/2023      The ASCVD Risk score (Arnett DK, et al., 2019) failed to calculate for the following reasons:   The 2019 ASCVD risk score is only valid for ages 36 to 75   Risk score cannot be calculated because patient has a medical history suggesting prior/existing ASCVD    Assessment & Plan:   #1 type 2 diabetes controlled with A1c today 6.2%.  Continue metformin .  Continue yearly diabetic eye exam.  Check urine microalbumin with next labs  #2 generalized weakness and high risk for falls.  Handout given on fall prevention in the home.  Set up outpatient physical therapy again.  #3 history of protein calorie malnutrition.  Weight is down but looks like has stabilized.  We talked about healthy weight gain strategies.  He would like to try to build some muscle mass.  Talked about goal for protein intake per day and hopefully can gain some strength with outpatient PT and home exercises  #4 history of hypertension currently stable on low-dose losartan  and carvedilol .  Continue current regimen.  #5 recently diagnosed bladder cancer.  Had surgery July 30.  Continue close follow-up with urology Return in about 6 months (around 05/10/2024).    Wolm Scarlet, MD

## 2023-11-13 ENCOUNTER — Telehealth: Payer: Self-pay | Admitting: Family Medicine

## 2023-11-13 DIAGNOSIS — R531 Weakness: Secondary | ICD-10-CM

## 2023-11-13 DIAGNOSIS — R296 Repeated falls: Secondary | ICD-10-CM

## 2023-11-13 NOTE — Telephone Encounter (Signed)
 Copied from CRM [id number]. Topic: Referral - Question >> Nov 13, 2023  4:25 PM Dedra B wrote: Reason for CRM: Schuyler from [facility] called and said that the pt has no showed for more appts than he actually made. He has trouble moving around and they recommend he gets a homehealth physical therapist. Schuyler can be reached at [phone].

## 2023-11-14 NOTE — Telephone Encounter (Signed)
 ATC but could not leave message for patient as voicemail is not set up

## 2023-11-16 ENCOUNTER — Other Ambulatory Visit: Payer: Self-pay | Admitting: Family Medicine

## 2023-11-18 DIAGNOSIS — N200 Calculus of kidney: Secondary | ICD-10-CM | POA: Diagnosis not present

## 2023-11-18 DIAGNOSIS — I5189 Other ill-defined heart diseases: Secondary | ICD-10-CM | POA: Diagnosis not present

## 2023-11-18 DIAGNOSIS — N390 Urinary tract infection, site not specified: Secondary | ICD-10-CM | POA: Diagnosis not present

## 2023-11-18 DIAGNOSIS — I5023 Acute on chronic systolic (congestive) heart failure: Secondary | ICD-10-CM | POA: Diagnosis not present

## 2023-11-18 DIAGNOSIS — Z7189 Other specified counseling: Secondary | ICD-10-CM | POA: Diagnosis not present

## 2023-11-18 DIAGNOSIS — I700 Atherosclerosis of aorta: Secondary | ICD-10-CM | POA: Diagnosis not present

## 2023-11-18 DIAGNOSIS — I10 Essential (primary) hypertension: Secondary | ICD-10-CM | POA: Diagnosis not present

## 2023-11-18 DIAGNOSIS — R509 Fever, unspecified: Secondary | ICD-10-CM | POA: Diagnosis not present

## 2023-11-18 DIAGNOSIS — S0990XA Unspecified injury of head, initial encounter: Secondary | ICD-10-CM | POA: Diagnosis not present

## 2023-11-18 DIAGNOSIS — E039 Hypothyroidism, unspecified: Secondary | ICD-10-CM | POA: Diagnosis not present

## 2023-11-18 DIAGNOSIS — I517 Cardiomegaly: Secondary | ICD-10-CM | POA: Diagnosis not present

## 2023-11-18 DIAGNOSIS — R130 Aphagia: Secondary | ICD-10-CM | POA: Diagnosis not present

## 2023-11-18 DIAGNOSIS — T17320A Food in larynx causing asphyxiation, initial encounter: Secondary | ICD-10-CM | POA: Diagnosis not present

## 2023-11-18 DIAGNOSIS — R1310 Dysphagia, unspecified: Secondary | ICD-10-CM | POA: Diagnosis not present

## 2023-11-18 DIAGNOSIS — N400 Enlarged prostate without lower urinary tract symptoms: Secondary | ICD-10-CM | POA: Diagnosis not present

## 2023-11-18 DIAGNOSIS — R52 Pain, unspecified: Secondary | ICD-10-CM | POA: Diagnosis not present

## 2023-11-18 DIAGNOSIS — Z8659 Personal history of other mental and behavioral disorders: Secondary | ICD-10-CM | POA: Diagnosis not present

## 2023-11-18 DIAGNOSIS — F32A Depression, unspecified: Secondary | ICD-10-CM | POA: Diagnosis not present

## 2023-11-18 DIAGNOSIS — E119 Type 2 diabetes mellitus without complications: Secondary | ICD-10-CM | POA: Diagnosis not present

## 2023-11-18 DIAGNOSIS — S32020A Wedge compression fracture of second lumbar vertebra, initial encounter for closed fracture: Secondary | ICD-10-CM | POA: Diagnosis not present

## 2023-11-18 DIAGNOSIS — R531 Weakness: Secondary | ICD-10-CM | POA: Diagnosis not present

## 2023-11-18 DIAGNOSIS — F419 Anxiety disorder, unspecified: Secondary | ICD-10-CM | POA: Diagnosis not present

## 2023-11-18 DIAGNOSIS — K8020 Calculus of gallbladder without cholecystitis without obstruction: Secondary | ICD-10-CM | POA: Diagnosis not present

## 2023-11-18 DIAGNOSIS — N281 Cyst of kidney, acquired: Secondary | ICD-10-CM | POA: Diagnosis not present

## 2023-11-18 DIAGNOSIS — G9340 Encephalopathy, unspecified: Secondary | ICD-10-CM | POA: Diagnosis not present

## 2023-11-18 DIAGNOSIS — K5730 Diverticulosis of large intestine without perforation or abscess without bleeding: Secondary | ICD-10-CM | POA: Diagnosis not present

## 2023-11-18 DIAGNOSIS — R059 Cough, unspecified: Secondary | ICD-10-CM | POA: Diagnosis not present

## 2023-11-18 DIAGNOSIS — R296 Repeated falls: Secondary | ICD-10-CM | POA: Diagnosis not present

## 2023-11-18 DIAGNOSIS — Z515 Encounter for palliative care: Secondary | ICD-10-CM | POA: Diagnosis not present

## 2023-11-18 DIAGNOSIS — R0602 Shortness of breath: Secondary | ICD-10-CM | POA: Diagnosis not present

## 2023-11-18 DIAGNOSIS — R410 Disorientation, unspecified: Secondary | ICD-10-CM | POA: Diagnosis not present

## 2023-11-18 DIAGNOSIS — R7989 Other specified abnormal findings of blood chemistry: Secondary | ICD-10-CM | POA: Diagnosis not present

## 2023-11-18 DIAGNOSIS — I5022 Chronic systolic (congestive) heart failure: Secondary | ICD-10-CM | POA: Diagnosis not present

## 2023-11-18 DIAGNOSIS — S22030A Wedge compression fracture of third thoracic vertebra, initial encounter for closed fracture: Secondary | ICD-10-CM | POA: Diagnosis not present

## 2023-11-18 DIAGNOSIS — I350 Nonrheumatic aortic (valve) stenosis: Secondary | ICD-10-CM | POA: Diagnosis not present

## 2023-11-18 DIAGNOSIS — S22020A Wedge compression fracture of second thoracic vertebra, initial encounter for closed fracture: Secondary | ICD-10-CM | POA: Diagnosis not present

## 2023-11-18 DIAGNOSIS — E785 Hyperlipidemia, unspecified: Secondary | ICD-10-CM | POA: Diagnosis not present

## 2023-11-18 DIAGNOSIS — S22000A Wedge compression fracture of unspecified thoracic vertebra, initial encounter for closed fracture: Secondary | ICD-10-CM | POA: Diagnosis not present

## 2023-11-18 DIAGNOSIS — C679 Malignant neoplasm of bladder, unspecified: Secondary | ICD-10-CM | POA: Diagnosis not present

## 2023-11-18 DIAGNOSIS — E871 Hypo-osmolality and hyponatremia: Secondary | ICD-10-CM | POA: Diagnosis not present

## 2023-11-18 DIAGNOSIS — D649 Anemia, unspecified: Secondary | ICD-10-CM | POA: Diagnosis not present

## 2023-11-18 NOTE — Telephone Encounter (Signed)
 Patient agreeable to referral and this has been placed.

## 2023-11-18 NOTE — Addendum Note (Signed)
 Addended by: METTA KRISTEN CROME on: 11/18/2023 08:52 AM   Modules accepted: Orders

## 2023-11-19 DIAGNOSIS — I5189 Other ill-defined heart diseases: Secondary | ICD-10-CM | POA: Diagnosis not present

## 2023-11-19 DIAGNOSIS — N200 Calculus of kidney: Secondary | ICD-10-CM | POA: Diagnosis not present

## 2023-11-19 DIAGNOSIS — S22030A Wedge compression fracture of third thoracic vertebra, initial encounter for closed fracture: Secondary | ICD-10-CM | POA: Diagnosis not present

## 2023-11-19 DIAGNOSIS — I517 Cardiomegaly: Secondary | ICD-10-CM | POA: Diagnosis not present

## 2023-11-19 DIAGNOSIS — I700 Atherosclerosis of aorta: Secondary | ICD-10-CM | POA: Diagnosis not present

## 2023-11-19 DIAGNOSIS — K5730 Diverticulosis of large intestine without perforation or abscess without bleeding: Secondary | ICD-10-CM | POA: Diagnosis not present

## 2023-11-19 DIAGNOSIS — S32020A Wedge compression fracture of second lumbar vertebra, initial encounter for closed fracture: Secondary | ICD-10-CM | POA: Diagnosis not present

## 2023-11-19 DIAGNOSIS — K8020 Calculus of gallbladder without cholecystitis without obstruction: Secondary | ICD-10-CM | POA: Diagnosis not present

## 2023-11-19 DIAGNOSIS — S22020A Wedge compression fracture of second thoracic vertebra, initial encounter for closed fracture: Secondary | ICD-10-CM | POA: Diagnosis not present

## 2023-11-19 DIAGNOSIS — N281 Cyst of kidney, acquired: Secondary | ICD-10-CM | POA: Diagnosis not present

## 2023-11-19 DIAGNOSIS — I350 Nonrheumatic aortic (valve) stenosis: Secondary | ICD-10-CM | POA: Diagnosis not present

## 2023-11-20 DIAGNOSIS — Z515 Encounter for palliative care: Secondary | ICD-10-CM | POA: Diagnosis not present

## 2023-11-20 DIAGNOSIS — R130 Aphagia: Secondary | ICD-10-CM | POA: Diagnosis not present

## 2023-11-20 DIAGNOSIS — Z79899 Other long term (current) drug therapy: Secondary | ICD-10-CM | POA: Diagnosis not present

## 2023-11-20 DIAGNOSIS — S32020A Wedge compression fracture of second lumbar vertebra, initial encounter for closed fracture: Secondary | ICD-10-CM | POA: Diagnosis not present

## 2023-11-20 DIAGNOSIS — Z66 Do not resuscitate: Secondary | ICD-10-CM | POA: Diagnosis not present

## 2023-11-20 DIAGNOSIS — Z91030 Bee allergy status: Secondary | ICD-10-CM | POA: Diagnosis not present

## 2023-11-20 DIAGNOSIS — E46 Unspecified protein-calorie malnutrition: Secondary | ICD-10-CM | POA: Diagnosis not present

## 2023-11-20 DIAGNOSIS — Z1152 Encounter for screening for COVID-19: Secondary | ICD-10-CM | POA: Diagnosis not present

## 2023-11-20 DIAGNOSIS — J9690 Respiratory failure, unspecified, unspecified whether with hypoxia or hypercapnia: Secondary | ICD-10-CM | POA: Diagnosis not present

## 2023-11-20 DIAGNOSIS — I110 Hypertensive heart disease with heart failure: Secondary | ICD-10-CM | POA: Diagnosis not present

## 2023-11-20 DIAGNOSIS — G9341 Metabolic encephalopathy: Secondary | ICD-10-CM | POA: Diagnosis not present

## 2023-11-20 DIAGNOSIS — S22030A Wedge compression fracture of third thoracic vertebra, initial encounter for closed fracture: Secondary | ICD-10-CM | POA: Diagnosis not present

## 2023-11-20 DIAGNOSIS — S22020A Wedge compression fracture of second thoracic vertebra, initial encounter for closed fracture: Secondary | ICD-10-CM | POA: Diagnosis not present

## 2023-11-20 DIAGNOSIS — F05 Delirium due to known physiological condition: Secondary | ICD-10-CM | POA: Diagnosis not present

## 2023-11-20 DIAGNOSIS — Z7982 Long term (current) use of aspirin: Secondary | ICD-10-CM | POA: Diagnosis not present

## 2023-11-20 DIAGNOSIS — Z91013 Allergy to seafood: Secondary | ICD-10-CM | POA: Diagnosis not present

## 2023-11-20 DIAGNOSIS — E119 Type 2 diabetes mellitus without complications: Secondary | ICD-10-CM | POA: Diagnosis not present

## 2023-11-20 DIAGNOSIS — Z885 Allergy status to narcotic agent status: Secondary | ICD-10-CM | POA: Diagnosis not present

## 2023-11-20 DIAGNOSIS — Z951 Presence of aortocoronary bypass graft: Secondary | ICD-10-CM | POA: Diagnosis not present

## 2023-11-20 DIAGNOSIS — I5023 Acute on chronic systolic (congestive) heart failure: Secondary | ICD-10-CM | POA: Diagnosis not present

## 2023-11-20 DIAGNOSIS — N3000 Acute cystitis without hematuria: Secondary | ICD-10-CM | POA: Diagnosis not present

## 2023-11-20 DIAGNOSIS — I252 Old myocardial infarction: Secondary | ICD-10-CM | POA: Diagnosis not present

## 2023-11-20 DIAGNOSIS — E871 Hypo-osmolality and hyponatremia: Secondary | ICD-10-CM | POA: Diagnosis not present

## 2023-11-20 DIAGNOSIS — Z888 Allergy status to other drugs, medicaments and biological substances status: Secondary | ICD-10-CM | POA: Diagnosis not present

## 2023-11-20 DIAGNOSIS — R059 Cough, unspecified: Secondary | ICD-10-CM | POA: Diagnosis not present

## 2023-11-20 DIAGNOSIS — Z7984 Long term (current) use of oral hypoglycemic drugs: Secondary | ICD-10-CM | POA: Diagnosis not present

## 2023-11-20 DIAGNOSIS — T17320A Food in larynx causing asphyxiation, initial encounter: Secondary | ICD-10-CM | POA: Diagnosis not present

## 2023-11-21 DIAGNOSIS — R531 Weakness: Secondary | ICD-10-CM | POA: Diagnosis not present

## 2023-11-21 DIAGNOSIS — J90 Pleural effusion, not elsewhere classified: Secondary | ICD-10-CM | POA: Diagnosis not present

## 2023-11-21 DIAGNOSIS — R0602 Shortness of breath: Secondary | ICD-10-CM | POA: Diagnosis not present

## 2023-11-21 DIAGNOSIS — Z4682 Encounter for fitting and adjustment of non-vascular catheter: Secondary | ICD-10-CM | POA: Diagnosis not present

## 2023-11-21 DIAGNOSIS — R4182 Altered mental status, unspecified: Secondary | ICD-10-CM | POA: Diagnosis not present

## 2023-11-21 DIAGNOSIS — J9811 Atelectasis: Secondary | ICD-10-CM | POA: Diagnosis not present

## 2023-12-02 DEATH — deceased

## 2023-12-05 ENCOUNTER — Telehealth: Payer: Self-pay | Admitting: Family Medicine

## 2023-12-05 NOTE — Telephone Encounter (Signed)
 Copied from CRM [id number]. Topic: General - Other >> Dec 05, 2023  8:30 AM Rosina BIRCH wrote: Reason for CRM: patient daughter called stating the patient passed away on 2023-11-25. Patient daughter wanted to let the provider know
